# Patient Record
Sex: Female | Born: 1970 | Race: White | Hispanic: No | Smoking: Current every day smoker
Health system: Southern US, Community
[De-identification: ages and names within clinical notes are randomized; demographics above are authoritative.]

## PROBLEM LIST (undated history)

## (undated) DIAGNOSIS — I509 Heart failure, unspecified: Secondary | ICD-10-CM

## (undated) DIAGNOSIS — E785 Hyperlipidemia, unspecified: Secondary | ICD-10-CM

## (undated) DIAGNOSIS — I4729 Other ventricular tachycardia: Secondary | ICD-10-CM

## (undated) DIAGNOSIS — I2510 Atherosclerotic heart disease of native coronary artery without angina pectoris: Secondary | ICD-10-CM

## (undated) DIAGNOSIS — E039 Hypothyroidism, unspecified: Secondary | ICD-10-CM

## (undated) DIAGNOSIS — I255 Ischemic cardiomyopathy: Secondary | ICD-10-CM

## (undated) DIAGNOSIS — N184 Chronic kidney disease, stage 4 (severe): Secondary | ICD-10-CM

## (undated) DIAGNOSIS — E669 Obesity, unspecified: Secondary | ICD-10-CM

## (undated) DIAGNOSIS — Z720 Tobacco use: Secondary | ICD-10-CM

## (undated) DIAGNOSIS — I5042 Chronic combined systolic (congestive) and diastolic (congestive) heart failure: Secondary | ICD-10-CM

## (undated) DIAGNOSIS — K859 Acute pancreatitis without necrosis or infection, unspecified: Secondary | ICD-10-CM

## (undated) DIAGNOSIS — Z955 Presence of coronary angioplasty implant and graft: Secondary | ICD-10-CM

## (undated) DIAGNOSIS — M1990 Unspecified osteoarthritis, unspecified site: Secondary | ICD-10-CM

## (undated) DIAGNOSIS — I10 Essential (primary) hypertension: Secondary | ICD-10-CM

## (undated) DIAGNOSIS — I472 Ventricular tachycardia: Secondary | ICD-10-CM

## (undated) HISTORY — PX: ABDOMINAL HYSTERECTOMY: SHX81

## (undated) HISTORY — DX: Presence of coronary angioplasty implant and graft: Z95.5

## (undated) HISTORY — DX: Atherosclerotic heart disease of native coronary artery without angina pectoris: I25.10

## (undated) HISTORY — PX: DILATION AND CURETTAGE OF UTERUS: SHX78

---

## 2005-07-13 ENCOUNTER — Emergency Department: Payer: Self-pay | Admitting: Emergency Medicine

## 2005-07-13 ENCOUNTER — Other Ambulatory Visit: Payer: Self-pay

## 2006-06-02 ENCOUNTER — Encounter: Payer: Self-pay | Admitting: Anesthesiology

## 2006-07-18 ENCOUNTER — Emergency Department: Payer: Self-pay | Admitting: Emergency Medicine

## 2006-11-03 ENCOUNTER — Emergency Department: Payer: Self-pay | Admitting: Emergency Medicine

## 2006-11-07 ENCOUNTER — Emergency Department: Payer: Self-pay

## 2007-01-09 ENCOUNTER — Emergency Department: Payer: Self-pay | Admitting: Emergency Medicine

## 2007-06-20 ENCOUNTER — Emergency Department: Payer: Self-pay | Admitting: Emergency Medicine

## 2007-12-23 ENCOUNTER — Ambulatory Visit: Payer: Self-pay | Admitting: Obstetrics & Gynecology

## 2007-12-23 ENCOUNTER — Other Ambulatory Visit: Payer: Self-pay

## 2007-12-24 ENCOUNTER — Ambulatory Visit: Payer: Self-pay | Admitting: Obstetrics & Gynecology

## 2008-02-07 ENCOUNTER — Inpatient Hospital Stay: Payer: Self-pay | Admitting: Internal Medicine

## 2008-09-24 ENCOUNTER — Emergency Department: Payer: Self-pay | Admitting: Emergency Medicine

## 2009-05-03 ENCOUNTER — Ambulatory Visit: Payer: Self-pay | Admitting: Anesthesiology

## 2009-05-09 ENCOUNTER — Ambulatory Visit: Payer: Self-pay | Admitting: Anesthesiology

## 2009-08-14 ENCOUNTER — Encounter: Payer: Self-pay | Admitting: Anesthesiology

## 2009-08-21 ENCOUNTER — Ambulatory Visit: Payer: Self-pay | Admitting: Anesthesiology

## 2009-09-13 ENCOUNTER — Encounter: Payer: Self-pay | Admitting: Anesthesiology

## 2009-10-09 ENCOUNTER — Encounter: Payer: Self-pay | Admitting: Anesthesiology

## 2009-10-13 ENCOUNTER — Encounter: Payer: Self-pay | Admitting: Anesthesiology

## 2010-10-31 IMAGING — US ULTRASOUND RIGHT BREAST
1 series · 17 of 21 positions shown · non-contrast
Comparison: none

REASON FOR EXAM: density
COMMENTS:

PROCEDURE:     US  - US FHER RT BREAST  - May 09, 2009  [DATE]
RESULT:     The right breast is evaluated in the region of interest from the
10 o'clock to the 12 o'clock position in the far lateral portion of the
breast. No solid or cystic sonographic abnormalities are identified.

[Series 1: ultrasound right breast · 17 of 21 slices shown]
[im 1/21]
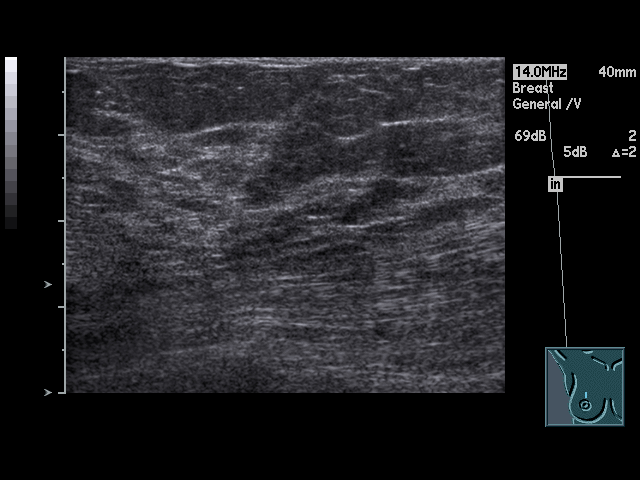
[im 2/21]
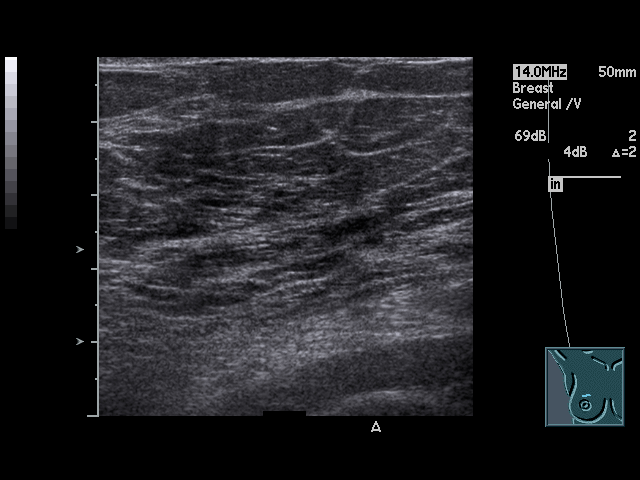
[im 4/21]
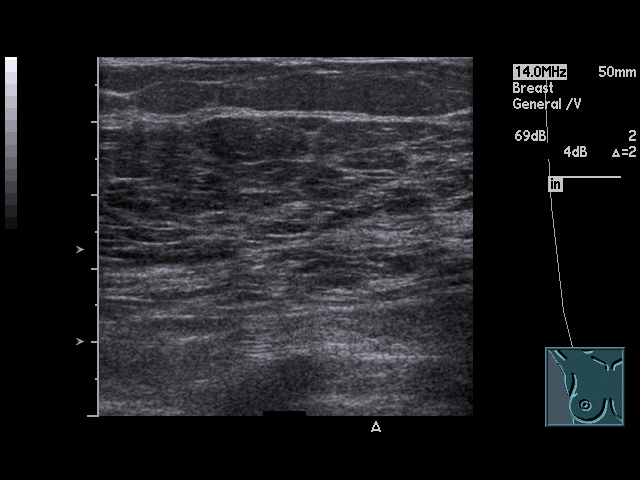
[im 5/21]
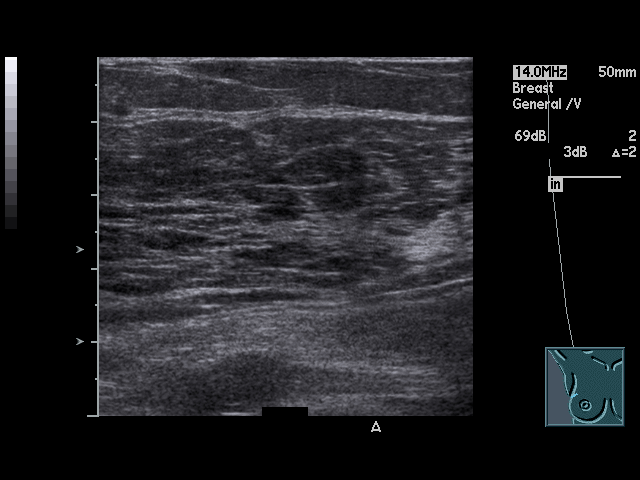
[im 6/21]
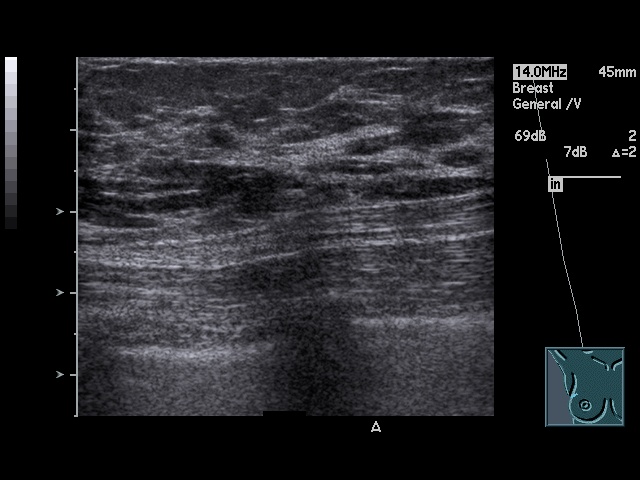
[im 7/21]
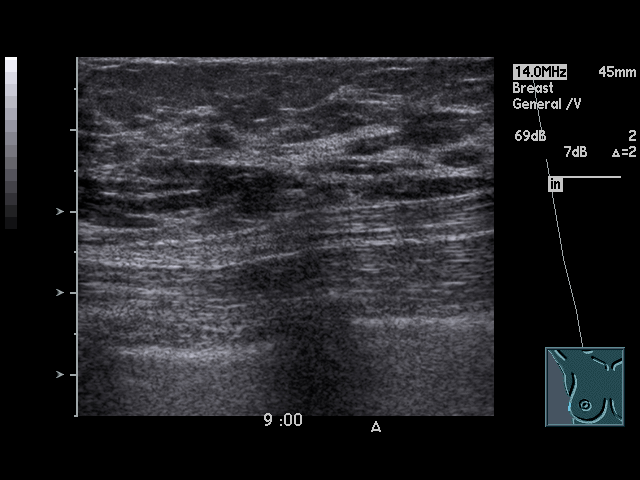
[im 9/21]
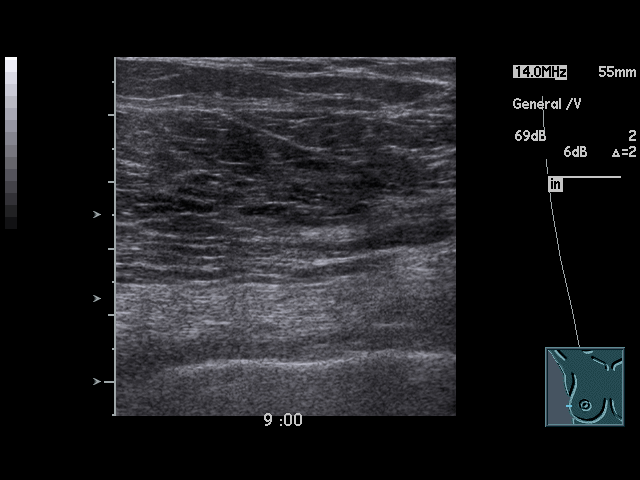
[im 10/21]
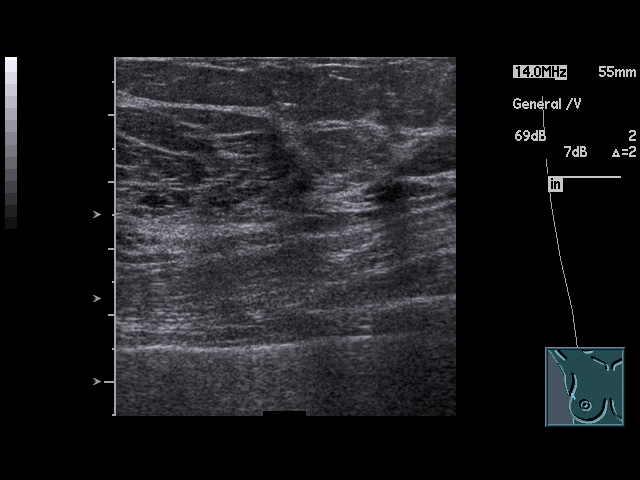
[im 11/21]
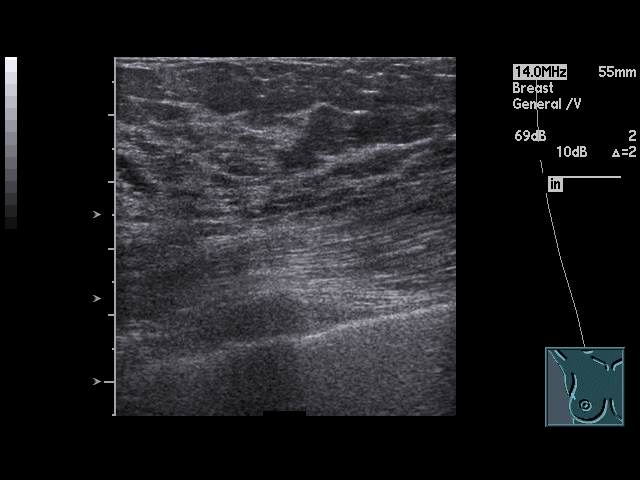
[im 12/21]
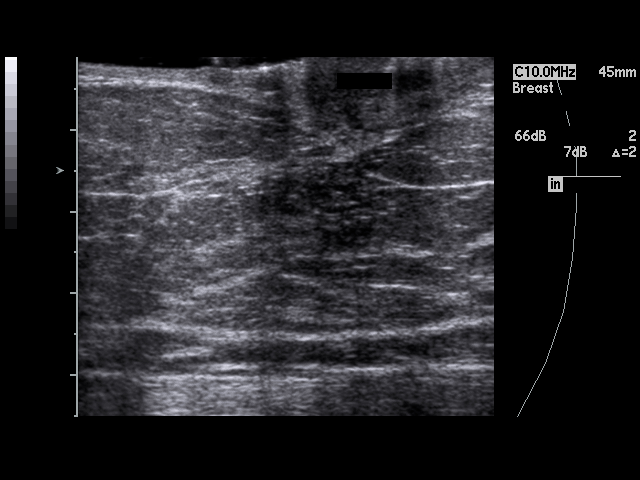
[im 13/21]
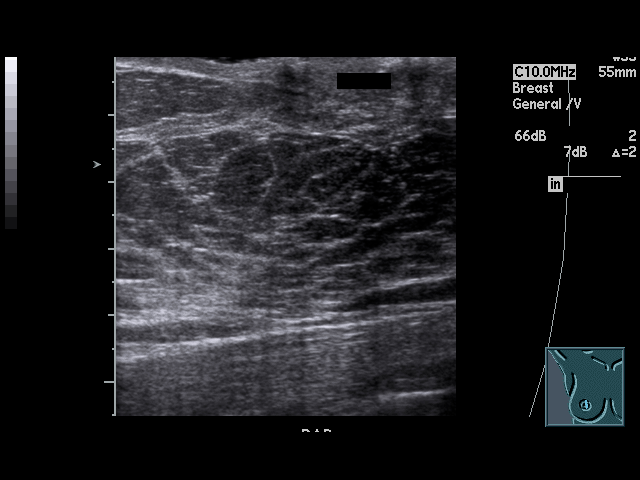
[im 15/21]
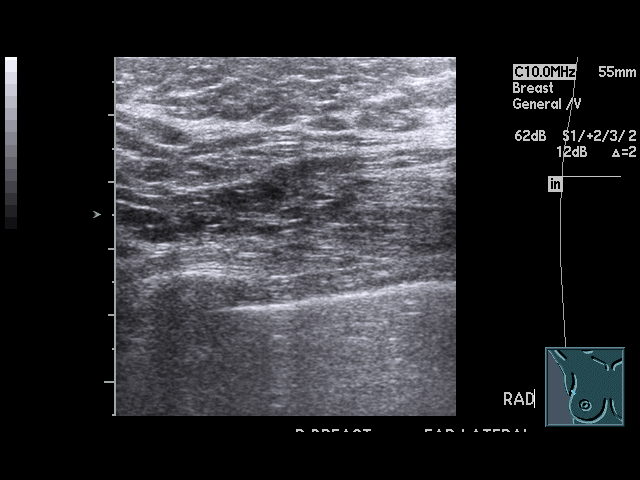
[im 16/21]
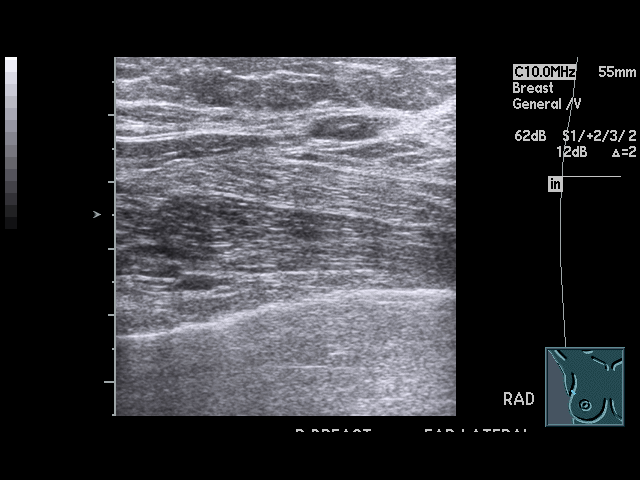
[im 17/21]
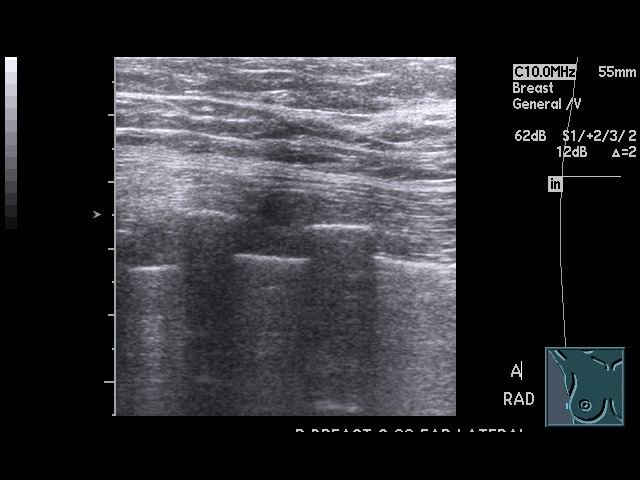
[im 18/21]
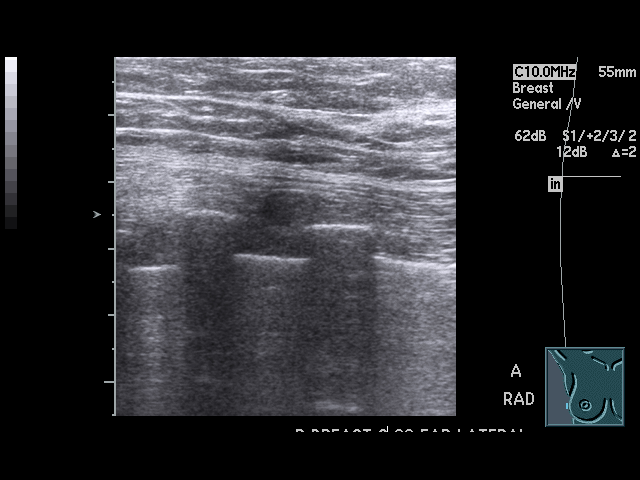
[im 20/21]
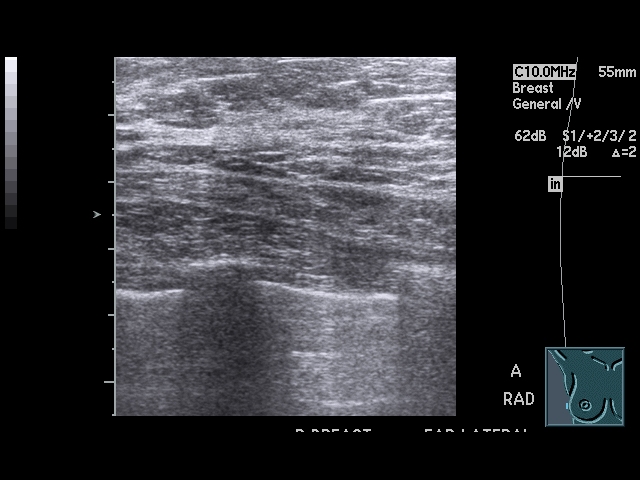
[im 21/21]
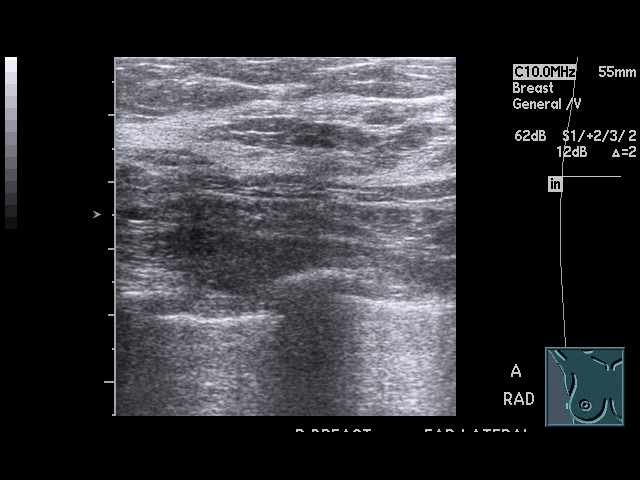

[17 of 21 positions shown; findings below may reference images not displayed]

IMPRESSION: 1.Unremarkable focused right breast ultrasound as described above. Please
refer to the additional radiographic view dictation for completed
discussion.

## 2011-03-27 ENCOUNTER — Emergency Department: Payer: Self-pay | Admitting: Emergency Medicine

## 2011-04-16 HISTORY — PX: CARDIAC CATHETERIZATION: SHX172

## 2011-05-08 ENCOUNTER — Emergency Department: Payer: Self-pay | Admitting: Emergency Medicine

## 2011-08-23 ENCOUNTER — Emergency Department: Payer: Self-pay | Admitting: Emergency Medicine

## 2011-08-23 LAB — T4, FREE: Free Thyroxine: 0.42 ng/dL — ABNORMAL LOW (ref 0.76–1.46)

## 2012-02-11 ENCOUNTER — Encounter (HOSPITAL_COMMUNITY)
Admission: EM | Disposition: A | Discharge status: Home / Self Care | Payer: Self-pay | Source: Home / Self Care | Attending: Cardiovascular Disease

## 2012-02-11 ENCOUNTER — Inpatient Hospital Stay (HOSPITAL_COMMUNITY)
Admission: EM | Admit: 2012-02-11 | Discharge: 2012-02-15 | DRG: 247 | Disposition: A | Discharge status: Home / Self Care | Payer: Medicaid Other | Attending: Cardiovascular Disease | Admitting: Cardiovascular Disease

## 2012-02-11 ENCOUNTER — Encounter (HOSPITAL_COMMUNITY): Payer: Self-pay | Admitting: Cardiology

## 2012-02-11 DIAGNOSIS — I2510 Atherosclerotic heart disease of native coronary artery without angina pectoris: Secondary | ICD-10-CM | POA: Diagnosis present

## 2012-02-11 DIAGNOSIS — I519 Heart disease, unspecified: Secondary | ICD-10-CM | POA: Diagnosis not present

## 2012-02-11 DIAGNOSIS — Z955 Presence of coronary angioplasty implant and graft: Secondary | ICD-10-CM

## 2012-02-11 DIAGNOSIS — I472 Ventricular tachycardia, unspecified: Secondary | ICD-10-CM | POA: Diagnosis not present

## 2012-02-11 DIAGNOSIS — F17200 Nicotine dependence, unspecified, uncomplicated: Secondary | ICD-10-CM | POA: Diagnosis present

## 2012-02-11 DIAGNOSIS — N1832 Chronic kidney disease, stage 3b: Secondary | ICD-10-CM | POA: Insufficient documentation

## 2012-02-11 DIAGNOSIS — E785 Hyperlipidemia, unspecified: Secondary | ICD-10-CM | POA: Diagnosis present

## 2012-02-11 DIAGNOSIS — E039 Hypothyroidism, unspecified: Secondary | ICD-10-CM | POA: Diagnosis present

## 2012-02-11 DIAGNOSIS — I10 Essential (primary) hypertension: Secondary | ICD-10-CM | POA: Diagnosis present

## 2012-02-11 DIAGNOSIS — I252 Old myocardial infarction: Secondary | ICD-10-CM

## 2012-02-11 DIAGNOSIS — M1990 Unspecified osteoarthritis, unspecified site: Secondary | ICD-10-CM | POA: Diagnosis present

## 2012-02-11 DIAGNOSIS — Y840 Cardiac catheterization as the cause of abnormal reaction of the patient, or of later complication, without mention of misadventure at the time of the procedure: Secondary | ICD-10-CM | POA: Diagnosis not present

## 2012-02-11 DIAGNOSIS — I255 Ischemic cardiomyopathy: Secondary | ICD-10-CM | POA: Diagnosis present

## 2012-02-11 DIAGNOSIS — N179 Acute kidney failure, unspecified: Secondary | ICD-10-CM | POA: Diagnosis present

## 2012-02-11 DIAGNOSIS — N184 Chronic kidney disease, stage 4 (severe): Secondary | ICD-10-CM | POA: Diagnosis present

## 2012-02-11 DIAGNOSIS — E669 Obesity, unspecified: Secondary | ICD-10-CM | POA: Diagnosis present

## 2012-02-11 DIAGNOSIS — I4729 Other ventricular tachycardia: Secondary | ICD-10-CM | POA: Diagnosis present

## 2012-02-11 DIAGNOSIS — I129 Hypertensive chronic kidney disease with stage 1 through stage 4 chronic kidney disease, or unspecified chronic kidney disease: Secondary | ICD-10-CM | POA: Diagnosis present

## 2012-02-11 DIAGNOSIS — I2589 Other forms of chronic ischemic heart disease: Secondary | ICD-10-CM | POA: Diagnosis present

## 2012-02-11 DIAGNOSIS — I2109 ST elevation (STEMI) myocardial infarction involving other coronary artery of anterior wall: Principal | ICD-10-CM

## 2012-02-11 DIAGNOSIS — Z720 Tobacco use: Secondary | ICD-10-CM

## 2012-02-11 HISTORY — DX: Other ventricular tachycardia: I47.29

## 2012-02-11 HISTORY — DX: Ischemic cardiomyopathy: I25.5

## 2012-02-11 HISTORY — PX: LEFT HEART CATHETERIZATION WITH CORONARY ANGIOGRAM: SHX5451

## 2012-02-11 HISTORY — DX: Obesity, unspecified: E66.9

## 2012-02-11 HISTORY — DX: Hypothyroidism, unspecified: E03.9

## 2012-02-11 HISTORY — DX: Ventricular tachycardia: I47.2

## 2012-02-11 HISTORY — DX: Essential (primary) hypertension: I10

## 2012-02-11 HISTORY — DX: Chronic kidney disease, stage 4 (severe): N18.4

## 2012-02-11 HISTORY — DX: Hyperlipidemia, unspecified: E78.5

## 2012-02-11 HISTORY — DX: Tobacco use: Z72.0

## 2012-02-11 HISTORY — DX: Atherosclerotic heart disease of native coronary artery without angina pectoris: I25.10

## 2012-02-11 SURGERY — LEFT HEART CATHETERIZATION WITH CORONARY ANGIOGRAM
Anesthesia: LOCAL

## 2012-02-11 MED ORDER — MIDAZOLAM HCL 2 MG/2ML IJ SOLN
INTRAMUSCULAR | Status: AC
  Filled 2012-02-11: qty 2

## 2012-02-11 MED ORDER — AMIODARONE HCL 150 MG/3ML IV SOLN
INTRAVENOUS | Status: AC
  Filled 2012-02-11: qty 3

## 2012-02-11 MED ORDER — HEPARIN (PORCINE) IN NACL 2-0.9 UNIT/ML-% IJ SOLN
INTRAMUSCULAR | Status: AC
  Filled 2012-02-11: qty 1500

## 2012-02-11 MED ORDER — BIVALIRUDIN 250 MG IV SOLR
INTRAVENOUS | Status: AC
  Filled 2012-02-11: qty 250

## 2012-02-11 MED ORDER — RAMIPRIL 2.5 MG PO CAPS
2.5000 mg | ORAL_CAPSULE | Freq: Every day | ORAL | Status: DC
  Filled 2012-02-11: qty 1

## 2012-02-11 MED ORDER — LIDOCAINE HCL (PF) 1 % IJ SOLN
INTRAMUSCULAR | Status: AC
  Filled 2012-02-11: qty 30

## 2012-02-11 MED ORDER — NITROGLYCERIN IN D5W 200-5 MCG/ML-% IV SOLN
INTRAVENOUS | Status: AC
  Filled 2012-02-11: qty 250

## 2012-02-11 MED ORDER — FENTANYL CITRATE 0.05 MG/ML IJ SOLN
INTRAMUSCULAR | Status: AC
  Filled 2012-02-11: qty 2

## 2012-02-11 MED ORDER — NITROGLYCERIN 0.2 MG/ML ON CALL CATH LAB
INTRAVENOUS | Status: AC
  Filled 2012-02-11: qty 1

## 2012-02-11 NOTE — H&P (Addendum)
Admit date: 02/11/2012 Primary Cardiologist   None Chief complaint/reason for admission:chest pain  HPI: This is a 41yo obese WF with a history of HTN and tobacco use who was in her USOH until 4PM when she awakened from a nap with chest pain.  The pain was in her back between her shoulder blades as well as both arms and chest.   This was associated with SOB, diaphoresis.  The pain increased in severity at 8PM and called EMS.  She was noted on EKG to have ST elevated in the lateral leads and significant ST depression in the anterior precordial leads.  Currently she complains pain as 5-6/10 in severity.    PMH:    HTN DJD Obesity Tobacco abuse Hypothyroidism   PSH:    Past Surgical History  Procedure Date  . Abdominal hysterectomy   . Dilation and curettage of uterus     ALLERGIES:   Review of patient's allergies indicates no known allergies.  Prior to Admit Meds:   (Not in a hospital admission) Family HX:   No family history on file. Social HX:    History   Social History  . Marital Status: Single    Spouse Name: N/A    Number of Children: N/A  . Years of Education: N/A   Occupational History  . Not on file.   Social History Main Topics  . Smoking status: Current Some Day Smoker -- 0.5 packs/day  . Smokeless tobacco: Not on file  . Alcohol Use:   . Drug Use: No  . Sexually Active:    Other Topics Concern  . Not on file   Social History Narrative  . No narrative on file     ROS:  All 11 ROS were addressed and are negative except what is stated in the HPI  PHYSICAL EXAM There were no vitals filed for this visit. General: Well developed, well nourished, in no acute distress Head: Eyes PERRLA, No xanthomas.   Normal cephalic and atramatic  Lungs:   Clear bilaterally to auscultation and percussion. Heart:   HRRR S1 S2 Pulses are 2+ & equal.            No carotid bruit. No JVD.  No abdominal bruits. No femoral bruits. Abdomen: Bowel sounds are positive, abdomen  soft and non-tender without masses  Extremities:   No clubbing, cyanosis or edema.  DP +1 Neuro: Alert and oriented X 3. Psych:  Good affect, responds appropriately       Radiology: Pending  EKG:  NSR with 57mm of ST elevation in I and aVL and reciprocal changes in the inferior leads.  ASSESSMENT:  1.  Acute STEMI with lateral ST elevation 2.  HTN 3.  Hypothyroidism 4.  Ongoing tobacco abuse 5.  Obesity 6.  Acute renal failure ? Secondary to diuretics  PLAN:   1.  Emergent cath per Dr. Burt Knack 2.  Hold  beta blocker secondary to hypotension in cath lab 3.  ASA 4.  Anticoagulation per Dr. Burt Knack 5.  Cycle cardiac enzymes until they peak 6.  Fasting lipid panel in am 7.  Smoking cessation counseling 8.  Add Lipitor 80mg  daily 9.  Hold diuretic post cath 10.  No ACE I secondary to acute renal failure  Sueanne Margarita, MD  02/11/2012  10:15 PM

## 2012-02-12 ENCOUNTER — Other Ambulatory Visit: Payer: Self-pay

## 2012-02-12 ENCOUNTER — Inpatient Hospital Stay (HOSPITAL_COMMUNITY): Payer: Medicaid Other

## 2012-02-12 DIAGNOSIS — I2109 ST elevation (STEMI) myocardial infarction involving other coronary artery of anterior wall: Secondary | ICD-10-CM

## 2012-02-12 DIAGNOSIS — I252 Old myocardial infarction: Secondary | ICD-10-CM

## 2012-02-12 DIAGNOSIS — I059 Rheumatic mitral valve disease, unspecified: Secondary | ICD-10-CM

## 2012-02-12 DIAGNOSIS — Z720 Tobacco use: Secondary | ICD-10-CM

## 2012-02-12 DIAGNOSIS — I2510 Atherosclerotic heart disease of native coronary artery without angina pectoris: Secondary | ICD-10-CM

## 2012-02-12 DIAGNOSIS — I10 Essential (primary) hypertension: Secondary | ICD-10-CM

## 2012-02-12 HISTORY — DX: Old myocardial infarction: I25.2

## 2012-02-12 HISTORY — DX: Essential (primary) hypertension: I10

## 2012-02-12 LAB — COMPREHENSIVE METABOLIC PANEL
Albumin: 3.4 g/dL — ABNORMAL LOW (ref 3.5–5.2)
Alkaline Phosphatase: 74 U/L (ref 39–117)
BUN: 32 mg/dL — ABNORMAL HIGH (ref 6–23)
Calcium: 8.7 mg/dL (ref 8.4–10.5)
Creatinine, Ser: 2.75 mg/dL — ABNORMAL HIGH (ref 0.50–1.10)
GFR calc non Af Amer: 20 mL/min — ABNORMAL LOW (ref >90)
Glucose, Bld: 119 mg/dL — ABNORMAL HIGH (ref 70–99)
Total Bilirubin: 0.2 mg/dL — ABNORMAL LOW (ref 0.3–1.2)
Total Protein: 6.5 g/dL (ref 6.0–8.3)

## 2012-02-12 LAB — HEMOGLOBIN A1C: Mean Plasma Glucose: 111 mg/dL (ref <117)

## 2012-02-12 LAB — POCT I-STAT 3, ART BLOOD GAS (G3+)
Acid-base deficit: 6 mmol/L — ABNORMAL HIGH (ref 0.0–2.0)
Bicarbonate: 16.9 mEq/L — ABNORMAL LOW (ref 20.0–24.0)
O2 Saturation: 94 %
pCO2 arterial: 25.8 mmHg — ABNORMAL LOW (ref 35.0–45.0)
pO2, Arterial: 66 mmHg — ABNORMAL LOW (ref 80.0–100.0)

## 2012-02-12 LAB — URINALYSIS, ROUTINE W REFLEX MICROSCOPIC
Glucose, UA: NEGATIVE mg/dL
Ketones, ur: NEGATIVE mg/dL
Nitrite: NEGATIVE
Protein, ur: NEGATIVE mg/dL

## 2012-02-12 LAB — BASIC METABOLIC PANEL
BUN: 31 mg/dL — ABNORMAL HIGH (ref 6–23)
CO2: 18 mEq/L — ABNORMAL LOW (ref 19–32)
Chloride: 97 mEq/L (ref 96–112)
Creatinine, Ser: 2.73 mg/dL — ABNORMAL HIGH (ref 0.50–1.10)

## 2012-02-12 LAB — CBC WITH DIFFERENTIAL/PLATELET
Basophils Relative: 0 % (ref 0–1)
HCT: 43.4 % (ref 36.0–46.0)
Hemoglobin: 14.5 g/dL (ref 12.0–15.0)
Lymphocytes Relative: 6 % — ABNORMAL LOW (ref 12–46)
Lymphs Abs: 0.9 10*3/uL (ref 0.7–4.0)
MCHC: 33.4 g/dL (ref 30.0–36.0)
Monocytes Absolute: 0.3 10*3/uL (ref 0.1–1.0)
Monocytes Relative: 2 % — ABNORMAL LOW (ref 3–12)
Neutro Abs: 15.4 10*3/uL — ABNORMAL HIGH (ref 1.7–7.7)
Neutrophils Relative %: 92 % — ABNORMAL HIGH (ref 43–77)
RBC: 4.74 MIL/uL (ref 3.87–5.11)

## 2012-02-12 LAB — POCT I-STAT, CHEM 8
BUN: 29 mg/dL — ABNORMAL HIGH (ref 6–23)
Chloride: 105 mEq/L (ref 96–112)
Creatinine, Ser: 2.6 mg/dL — ABNORMAL HIGH (ref 0.50–1.10)
Glucose, Bld: 117 mg/dL — ABNORMAL HIGH (ref 70–99)
Hemoglobin: 13.9 g/dL (ref 12.0–15.0)
Potassium: 3.3 mEq/L — ABNORMAL LOW (ref 3.5–5.1)
Sodium: 136 mEq/L (ref 135–145)

## 2012-02-12 LAB — LIPID PANEL
LDL Cholesterol: 92 mg/dL (ref 0–99)
Triglycerides: 128 mg/dL (ref <150)
VLDL: 26 mg/dL (ref 0–40)

## 2012-02-12 LAB — TSH: TSH: 0.474 u[IU]/mL (ref 0.350–4.500)

## 2012-02-12 LAB — CBC
HCT: 42.2 % (ref 36.0–46.0)
MCV: 90.9 fL (ref 78.0–100.0)
RDW: 13.8 % (ref 11.5–15.5)
WBC: 16.7 10*3/uL — ABNORMAL HIGH (ref 4.0–10.5)

## 2012-02-12 LAB — CK TOTAL AND CKMB (NOT AT ARMC)
CK, MB: >500 ng/mL (ref 0.3–4.0)
CK, MB: 361.9 ng/mL (ref 0.3–4.0)
CK, MB: 235.8 ng/mL (ref 0.3–4.0)
Total CK: 5973 U/L — ABNORMAL HIGH (ref 7–177)
Total CK: 4160 U/L — ABNORMAL HIGH (ref 7–177)

## 2012-02-12 LAB — PROTIME-INR
INR: 5.01 (ref 0.00–1.49)
Prothrombin Time: 43.2 seconds — ABNORMAL HIGH (ref 11.6–15.2)

## 2012-02-12 LAB — APTT: aPTT: 103 seconds — ABNORMAL HIGH (ref 24–37)

## 2012-02-12 LAB — URINE MICROSCOPIC-ADD ON

## 2012-02-12 LAB — TROPONIN I: Troponin I: >20 ng/mL (ref <0.30)

## 2012-02-12 MED ORDER — PANTOPRAZOLE SODIUM 40 MG PO TBEC
40.0000 mg | DELAYED_RELEASE_TABLET | Freq: Every day | ORAL | Status: DC
  Administered 2012-02-13 – 2012-02-15 (×3): 40 mg via ORAL
  Filled 2012-02-12 (×3): qty 1

## 2012-02-12 MED ORDER — LIDOCAINE HCL (CARDIAC) 20 MG/ML IV SOLN
INTRAVENOUS | Status: AC
  Filled 2012-02-12: qty 5

## 2012-02-12 MED ORDER — ONDANSETRON HCL 4 MG/2ML IJ SOLN: 4.0000 mg | Freq: Four times a day (QID) | INTRAMUSCULAR | Status: DC | PRN

## 2012-02-12 MED ORDER — SODIUM CHLORIDE 0.9 % IV SOLN
0.2500 mg/kg/h | INTRAVENOUS | Status: AC
  Filled 2012-02-12: qty 250

## 2012-02-12 MED ORDER — ATORVASTATIN CALCIUM 80 MG PO TABS
80.0000 mg | ORAL_TABLET | Freq: Every day | ORAL | Status: DC
  Administered 2012-02-12 – 2012-02-13 (×2): 80 mg via ORAL
  Filled 2012-02-12 (×4): qty 1

## 2012-02-12 MED ORDER — PRASUGREL HCL 10 MG PO TABS
ORAL_TABLET | ORAL | Status: AC
  Filled 2012-02-12: qty 6

## 2012-02-12 MED ORDER — NITROGLYCERIN 0.4 MG SL SUBL: 0.4000 mg | SUBLINGUAL_TABLET | SUBLINGUAL | Status: DC | PRN

## 2012-02-12 MED ORDER — ASPIRIN EC 81 MG PO TBEC: 81.0000 mg | DELAYED_RELEASE_TABLET | Freq: Every day | ORAL | Status: DC

## 2012-02-12 MED ORDER — SODIUM CHLORIDE 0.9 % IV SOLN
INTRAVENOUS | Status: AC
  Administered 2012-02-12: 02:00:00 via INTRAVENOUS

## 2012-02-12 MED ORDER — PRASUGREL HCL 10 MG PO TABS
10.0000 mg | ORAL_TABLET | Freq: Every day | ORAL | Status: DC
  Administered 2012-02-12 – 2012-02-15 (×4): 10 mg via ORAL
  Filled 2012-02-12 (×4): qty 1

## 2012-02-12 MED ORDER — MORPHINE SULFATE 2 MG/ML IJ SOLN
2.0000 mg | INTRAMUSCULAR | Status: DC | PRN
  Administered 2012-02-12 – 2012-02-13 (×5): 2 mg via INTRAVENOUS
  Filled 2012-02-12 (×4): qty 1

## 2012-02-12 MED ORDER — ACETAMINOPHEN 325 MG PO TABS: 650.0000 mg | ORAL_TABLET | ORAL | Status: DC | PRN

## 2012-02-12 MED ORDER — ALPRAZOLAM 0.5 MG PO TABS
1.0000 mg | ORAL_TABLET | Freq: Three times a day (TID) | ORAL | Status: DC
  Administered 2012-02-12 – 2012-02-15 (×10): 1 mg via ORAL
  Filled 2012-02-12 (×3): qty 1
  Filled 2012-02-12 (×6): qty 2
  Filled 2012-02-12: qty 1
  Filled 2012-02-12 (×2): qty 2

## 2012-02-12 MED ORDER — OXYCODONE-ACETAMINOPHEN 5-325 MG PO TABS
1.0000 | ORAL_TABLET | ORAL | Status: DC | PRN
  Administered 2012-02-13 – 2012-02-15 (×8): 2 via ORAL
  Administered 2012-02-15 (×2): 1 via ORAL
  Filled 2012-02-12 (×6): qty 2
  Filled 2012-02-12: qty 1
  Filled 2012-02-12 (×3): qty 2
  Filled 2012-02-12 (×2): qty 1

## 2012-02-12 MED ORDER — ASPIRIN 81 MG PO CHEW
81.0000 mg | CHEWABLE_TABLET | Freq: Every day | ORAL | Status: DC
  Administered 2012-02-12 – 2012-02-15 (×4): 81 mg via ORAL
  Filled 2012-02-12 (×4): qty 1

## 2012-02-12 MED ORDER — DOPAMINE-DEXTROSE 3.2-5 MG/ML-% IV SOLN
INTRAVENOUS | Status: AC
  Filled 2012-02-12: qty 250

## 2012-02-12 MED ORDER — LORAZEPAM 1 MG PO TABS
1.0000 mg | ORAL_TABLET | Freq: Once | ORAL | Status: AC
  Administered 2012-02-12: 1 mg via ORAL
  Filled 2012-02-12: qty 1

## 2012-02-12 MED ORDER — ACETAMINOPHEN 325 MG PO TABS
650.0000 mg | ORAL_TABLET | ORAL | Status: DC | PRN
  Administered 2012-02-12: 650 mg via ORAL
  Filled 2012-02-12: qty 2

## 2012-02-12 MED ORDER — CARVEDILOL 3.125 MG PO TABS
3.1250 mg | ORAL_TABLET | Freq: Two times a day (BID) | ORAL | Status: DC
  Administered 2012-02-12 – 2012-02-15 (×5): 3.125 mg via ORAL
  Filled 2012-02-12 (×9): qty 1

## 2012-02-12 MED ORDER — LEVOTHYROXINE SODIUM 100 MCG PO TABS
100.0000 ug | ORAL_TABLET | Freq: Every day | ORAL | Status: DC
  Administered 2012-02-12 – 2012-02-15 (×4): 100 ug via ORAL
  Filled 2012-02-12 (×5): qty 1

## 2012-02-12 MED ORDER — ONDANSETRON HCL 4 MG/2ML IJ SOLN
INTRAMUSCULAR | Status: AC
  Filled 2012-02-12: qty 2

## 2012-02-12 MED ORDER — MORPHINE SULFATE 2 MG/ML IJ SOLN
INTRAMUSCULAR | Status: AC
  Administered 2012-02-12: 2 mg via INTRAVENOUS
  Filled 2012-02-12: qty 1

## 2012-02-12 MED ORDER — NICOTINE 7 MG/24HR TD PT24
7.0000 mg | MEDICATED_PATCH | Freq: Every day | TRANSDERMAL | Status: DC
  Administered 2012-02-12 – 2012-02-15 (×4): 7 mg via TRANSDERMAL
  Filled 2012-02-12 (×4): qty 1

## 2012-02-12 MED FILL — Dextrose Inj 5%: INTRAVENOUS | Qty: 50 | Status: AC

## 2012-02-12 NOTE — Progress Notes (Addendum)
ANTICOAGULATION CONSULT NOTE - Initial Consult  Pharmacy Consult for angiomax Indication: ACS s/p cath  No Known Allergies  Patient Measurements:   Heparin Dosing Weight: 79 kg  Vital Signs:    Labs: No results found for this basename: HGB:2,HCT:3,PLT:3,APTT:3,LABPROT:3,INR:3,HEPARINUNFRC:3,CREATININE:3,CKTOTAL:3,CKMB:3,TROPONINI:3 in the last 72 hours  CrCl is unknown because no creatinine reading has been taken and the patient has no height on file.   Medical History: Past Medical History  Diagnosis Date  . Hypertension     Medications:  Scheduled:    . amiodarone      . bivalirudin      . bivalirudin      . DOPamine      . fentaNYL      . heparin      . lidocaine (cardiac) 100 mg/20ml      . lidocaine      . midazolam      . midazolam      . nitroGLYCERIN      . nitroGLYCERIN      . ondansetron      . prasugrel      . ramipril  2.5 mg Oral Daily   Infusions:    Assessment: 41 yo female s/p cath will be continued on angiomax for 2 hours.  Patient is not on heparin post cath.   Goal of Therapy:      Plan:  1) Continue angiomax 0.25 mg/kg/hr for 2 hours then stop.   Marializ Ferrebee, Tsz-Yin 02/12/2012,1:02 AM

## 2012-02-12 NOTE — CV Procedure (Signed)
Cardiac Catheterization Procedure Note  Name: Angelica Herrera MRN: [id number] DOB: 1970-07-17  Procedure: Left Heart Cath, Selective Coronary Angiography, LV angiography,  PTCA/Stent of the left main, PTCA of the LAD, Perclose of right femoral artery  Indication: Anterolateral ST elevation MI   Diagnostic Procedure Details: The right groin was prepped, draped, and anesthetized with 1% lidocaine. Using the modified Seldinger technique, a 6 French sheath was introduced into the right femoral artery. Standard Judkins catheters were used for selective coronary angiography and left ventriculography. Catheter exchanges were performed over a wire.  The diagnostic procedure was well-tolerated without immediate complications.  PROCEDURAL FINDINGS Hemodynamics: AO 125/95 LV 123/17  Coronary angiography: Coronary dominance: right  Left mainstem: Total occlusion at the ostium  Left anterior descending (LAD): Fills faintly from right-sided collaterals. Small severely diseased vessel.  Left circumflex (LCx): The left circumflex is visualized only after reopening the left main. The circumflex supplies a small territory with a moderate caliber intermediate branch and a small AV groove circumflex  Right coronary artery (RCA): Large, dominant vessel. There is mild 30-40% mid vessel stenosis. The acute marginal, PDA, and PLA branches are all patent. There are collateral supply to the LAD/diagonal territory.  Left ventriculography: Left ventricular systolic function shows akinesis of the anterolateral wall. The apex and inferior wall contract vigorously. There is severe mitral regurgitation present.  PCI Procedure Note:  Following the diagnostic procedure, the decision was made to proceed with PCI.  Weight-based bivalirudin was given for anticoagulation. Unfortunately the left mainstem was extremely difficult to visualize. I attempted multiple guide catheters, probably greater than 10 catheters in all. A  prolonged attempt was made and this created a major delay in door to device time. Aortograms were performed in multiple projections. I finally was able to get close to the left mainstem with a JL-4 5 French bright tip catheter. A BMW coronary guidewire was used to cross the lesion.  The lesion was predilated with a 1.5 mm balloon.  The patient became hemodynamically unstable after opening her left main. She developed ventricular tachycardia and we treated her with intravenous lidocaine and amiodarone. This caused marked hypotension and she finally converted to sinus rhythm. She did not require electrical cardioversion. Dopamine had to be started to support her blood pressure. When she stabilized hemodynamically, attention was turned to the mid LAD which remained occluded. The vessel was wired and multiple balloon dilatations were performed. The vessel is extremely small I was never able to restore TIMI-3 flow into the LAD. I felt the most appropriate thing to do was to stabilize the left mainstem. The left main was stented with a 3.0 x 15 mm drug-eluting stent deployed at 12 atmospheres. The stent was postdilated with a 3.0 mm noncompliant balloon to 16 atmospheres on 2 inflations. There was an excellent angiographic result with TIMI-3 flow into the circumflex, intermediate branch, and proximal left mainstem.  Following PCI, there was 0% residual stenosis and TIMI-3 flow. Final angiography confirmed an excellent result. Femoral hemostasis was achieved with a Perclose device.  The patient was hemodynamically stable at the completion of the procedure. There were no immediate procedural complications.  The patient was transferred to the post catheterization recovery area for further monitoring.  PCI Data: Vessel - left main/Segment - ostial Percent Stenosis (pre)  100 TIMI-flow 0 Stent 3.0 x 15 mm drug-eluting Percent Stenosis (post) 0 TIMI-flow (post) 3  Lesion 2: LAD/mid Percent stenosis 100 TIMI flow  0 Device: 1.5 mm balloon Percent stenosis post 100  TIMI flow 0  Final Conclusions:   1. Acute total occlusion of the left mainstem 2. Nonobstructive stenosis of a large, dominant right coronary artery 3. Successful very complex PCI of the native left main with challenging anatomy 4. Residual total occlusion of the LAD in the midsegment. This vessel is extremely small in caliber. 5. Moderate segmental left ventricular systolic dysfunction with an estimated LVEF of 35-40%  Recommendations: Will monitor the patient closely in the CCU. She was given 60 mg of effient at the completion of the procedure. Bivalirudin will be continued at ACS dose for 2 hours. Post MI medical therapy will be instituted. She will require tobacco cessation and aggressive risk reduction measures.  [name] 02/12/2012, 12:39 AM

## 2012-02-12 NOTE — Progress Notes (Signed)
Brief Nutrition Note:   RD pulled to chart for pt with unintentional weight loss and poor appetite PTA.   Pt states that she was off her thyroid medicine for a while and that caused weight changes. Pt states her appetite is variable, some days eats everything some days will not eat at all.  Pt states that in the last few months her weight has been trending up. 150-160 lbs UBW, now at 174 lbs.   Pt eating breakfast at time of RD visit, consumed >50% of meal.  Body mass index is 28.11 kg/(m^2). Pt is overweight per current BMI.  Diet: Heart healthy  Chart Reviewed, no nutrition interventions warranted at this time. Please consult as needed.   Orson Slick RD, LDN Pager [phone] After Hours pager [phone]

## 2012-02-12 NOTE — Progress Notes (Signed)
[facility] Progress Note Patient Name: Angelica Herrera DOB: April 13, 1971 MRN: [id number]  Date of Service  02/12/2012   HPI/Events of Note   SUP  needed  eICU Interventions  PPI ordered    Intervention Category Intermediate Interventions: Best-practice therapies (e.g. DVT, beta blocker, etc.)  Asencion Noble 02/12/2012, 4:18 PM

## 2012-02-12 NOTE — Care Management Note (Signed)
    Page 1 of 1   02/12/2012     9:59:01 AM   CARE MANAGEMENT NOTE 02/12/2012  Patient:  Angelica Herrera, Angelica Herrera   Account Number:  [id number]  Date Initiated:  02/12/2012  Documentation initiated by:  Elissa Hefty  Subjective/Objective Assessment:   adm w mi     Action/Plan:   lives w husband and children, has medicaid for meds, pcp dr Rogue Jury   Anticipated DC Date:     Anticipated DC Plan:        DC Planning Services  CM consult      Choice offered to / List presented to:             Status of service:   Medicare Important Message given?   (If response is "NO", the following Medicare IM given date fields will be blank) Date Medicare IM given:   Date Additional Medicare IM given:    Discharge Disposition:  HOME/SELF CARE  Per UR Regulation:  Reviewed for med. necessity/level of care/duration of stay  If discussed at [place] of Stay Meetings, dates discussed:    Comments:  10/30 10am debbie Joyel Chenette rn,bsn [id number] spoke w pt and husband, she has medicaid ins. left pt 30day free card for effient.

## 2012-02-12 NOTE — Interval H&P Note (Signed)
History and Physical Interval Note:  02/12/2012 12:38 AM  Angelica Herrera  has presented today for surgery, with the diagnosis of Stemi  The various methods of treatment have been discussed with the patient and family. After consideration of risks, benefits and other options for treatment, the patient has consented to  Procedure(s) (LRB) with comments: LEFT HEART CATHETERIZATION WITH CORONARY ANGIOGRAM (N/A) as a surgical intervention .  The patient's history has been reviewed, patient examined, no change in status, stable for surgery.  I have reviewed the patient's chart and labs.  Questions were answered to the patient's satisfaction.     [name]

## 2012-02-12 NOTE — Progress Notes (Signed)
*  PRELIMINARY RESULTS* Echocardiogram 2D Echocardiogram has been performed.  Angelica Herrera 02/12/2012, 12:38 PM

## 2012-02-12 NOTE — Progress Notes (Signed)
Smoking cessation education done and information packet given to patient.

## 2012-02-12 NOTE — Progress Notes (Signed)
Cardiology Progress Note Patient Name: Angelica Herrera Date of Encounter: 02/12/2012, 6:16 AM     Subjective  NSVT on tele. Patient asymptomatic and hemodynamically stable. Has some chest tenderness, but nothing like before she came in. No sob, cough, or dysuria.    Objective   Telemetry: sinus rhythm 80s, freq runs of 3-5 beats NSVT and 21beat run of NSVT  Medications: . amiodarone      . aspirin  81 mg Oral Daily  . atorvastatin  80 mg Oral q1800  . bivalirudin      . bivalirudin      . DOPamine      . fentaNYL      . heparin      . levothyroxine  100 mcg Oral QAC breakfast  . lidocaine (cardiac) 100 mg/9ml      . lidocaine      . LORazepam  1 mg Oral Once  . midazolam      . midazolam      . nitroGLYCERIN      . nitroGLYCERIN      . ondansetron      . prasugrel      . prasugrel  10 mg Oral Daily  . ramipril  2.5 mg Oral Daily  . DISCONTD: aspirin EC  81 mg Oral Daily   . sodium chloride 100 mL/hr at 02/12/12 0131  . bivalirudin (ANGIOMAX) infusion 5 mg/mL (Cath Lab,ACS,PCI indication) Stopped (02/12/12 0245)    Physical Exam: Temp:  [97.5 F (36.4 C)-97.8 F (36.6 C)] 97.8 F (36.6 C) (10/30 0600) Pulse Rate:  [77-90] 79  (10/30 0600) Resp:  [8-22] 19  (10/30 0600) BP: (89-110)/(65-89) 103/73 mmHg (10/30 0600) SpO2:  [90 %-100 %] 99 % (10/30 0600) Weight:  [174 lb 2.6 oz (79 kg)] 174 lb 2.6 oz (79 kg) (10/30 0100)  General: Middle aged white female, in no acute distress. Head: Normocephalic, atraumatic, sclera non-icteric, nares are without discharge.  Neck: Supple. Negative for carotid bruits or JVD Lungs: Clear bilaterally to auscultation without wheezes, rales, or rhonchi. Breathing is unlabored. Heart: RRR S1 S2 without murmurs, rubs, or gallops.  Abdomen: Soft, non-tender, non-distended with normoactive bowel sounds. No rebound/guarding. No obvious abdominal masses. Msk:  Strength and tone appear normal for age. Extremities: Right groin  without hematoma or bruit. No edema. No clubbing or cyanosis. Distal pedal pulses are intact and equal bilaterally. Neuro: Alert and oriented X 3. Moves all extremities spontaneously. Psych:  Responds to questions appropriately with a normal affect.   Intake/Output Summary (Last 24 hours) at 02/12/12 0616 Last data filed at 02/12/12 0300  Gross per 24 hour  Intake    200 ml  Output    550 ml  Net   -350 ml    Labs:  Basename 02/12/12 0126  NA 130*  K 3.7  CL 99  CO2 17*  GLUCOSE 119*  BUN 32*  CREATININE 2.75*  CALCIUM 8.7  MG 2.2   Basename 02/12/12 0126  AST 434*  ALT 62*  ALKPHOS 74  BILITOT 0.2*  PROT 6.5  ALBUMIN 3.4*   Basename 02/12/12 0126  WBC 16.7*  NEUTROABS 15.4*  HGB 14.5  HCT 43.4  MCV 91.6  PLT 263     02/12/2012 03:20  Prothrombin Time 25.6 (H)  INR 2.47 (H)   Basename 02/12/12 0115  TROPONINI >20.00*   Radiology/Studies:   02/12/12 - Cardiac Cath Hemodynamics:  AO 125/95  LV 123/17  Coronary angiography:  Coronary dominance: right  Left mainstem: Total occlusion at the ostium  Left anterior descending (LAD): Fills faintly from right-sided collaterals. Small severely diseased vessel.  Left circumflex (LCx): The left circumflex is visualized only after reopening the left main. The circumflex supplies a small territory with a moderate caliber intermediate branch and a small AV groove circumflex  Right coronary artery (RCA): Large, dominant vessel. There is mild 30-40% mid vessel stenosis. The acute marginal, PDA, and PLA branches are all patent. There are collateral supply to the LAD/diagonal territory.  Left ventriculography: Left ventricular systolic function shows akinesis of the anterolateral wall. The apex and inferior wall contract vigorously. There is severe mitral regurgitation present.  PCI Data:  Vessel - left main/Segment - ostial  Percent Stenosis (pre) 100  TIMI-flow 0  Stent 3.0 x 15 mm drug-eluting  Percent Stenosis  (post) 0  TIMI-flow (post) 3  Lesion 2: LAD/mid  Percent stenosis 100  TIMI flow 0  Device: 1.5 mm balloon  Percent stenosis post 100  TIMI flow 0  Final Conclusions:  1. Acute total occlusion of the left mainstem  2. Nonobstructive stenosis of a large, dominant right coronary artery  3. Successful very complex PCI of the native left main with challenging anatomy  4. Residual total occlusion of the LAD in the midsegment. This vessel is extremely small in caliber.  5. Moderate segmental left ventricular systolic dysfunction with an estimated LVEF of 35-40%  Recommendations: Will monitor the patient closely in the CCU. She was given 60 mg of effient at the completion of the procedure. Bivalirudin will be continued at ACS dose for 2 hours. Post MI medical therapy will be instituted. She will require tobacco cessation and aggressive risk reduction measures.    Assessment and Plan    1. Acute Anterior STEMI: s/p DES to LM with residual total occlusion of the mid LAD. LVEF 35-40%. During cath she developed VT treated with lidocaine and amiodarone with subsequent hypotension treated with dopamine. She has done well overnight and has not required any further antiarrythmics or vasopressors. EKG pending. Troponin >20. Cont to trend along with CK/MB. Cont ASA, Effient, ACEI, and statin. Consider addition of BB if BP tolerates. Lipid panel and A1c pending.  2. NSVT: Frequent episodes of 3-5 beats NSVT along with a prolonged episode of 21 beats this morning. Has remained hemodynamically stable and asymptomatic. BP soft 90-100s. MD advise on initiating low dose BB (was on 50mg  lopressor bid at home).  3. Leukocytosis: In the setting of #1. Afebrile without obvious source of infection. Will check UA and CXR.  4. Transaminitis: likely reflective of myocardial enzyme release rather than hepatic source  5. Acute Renal Insufficiency: Crt 2.75. Baseline unknown. Cont IVF and check BMET in am.  6.  Hypertension: BP soft. Cont to monitor.  7. Tobacco Abuse: Counseled on cessation  8. Hypothyroidism: TSH pending. Cont synthroid  Signed, HOPE, JESSICA PA-C  Patient seen, examined. Available data reviewed. Agree with findings, assessment, and plan as outlined by [facility], PA-C. Pt independently examined. Exam pertinent for an S4 gallop and clear lung fields. I'm going to wait on starting an ACE-I because of significant renal insufficiency and high contrast load at cath. Start coreg 3.125 mg BID. Will check a 2D echo to evaluate for MR (seen at cath but no murmur on exam). Continue ASA, effient, and high-dose lipitor. Start nicotine patch. Keep in CCU today on bedrest today with up to chair. Will stop IV fluids and allow her  to equilibrate.  [name], M.D. 02/12/2012 7:57 AM

## 2012-02-13 ENCOUNTER — Encounter (HOSPITAL_COMMUNITY): Payer: Self-pay | Admitting: *Deleted

## 2012-02-13 LAB — COMPREHENSIVE METABOLIC PANEL
Alkaline Phosphatase: 57 U/L (ref 39–117)
BUN: 30 mg/dL — ABNORMAL HIGH (ref 6–23)
Creatinine, Ser: 2.48 mg/dL — ABNORMAL HIGH (ref 0.50–1.10)
GFR calc Af Amer: 26 mL/min — ABNORMAL LOW (ref >90)
Glucose, Bld: 95 mg/dL (ref 70–99)
Potassium: 3.8 mEq/L (ref 3.5–5.1)
Total Bilirubin: 0.2 mg/dL — ABNORMAL LOW (ref 0.3–1.2)
Total Protein: 5.5 g/dL — ABNORMAL LOW (ref 6.0–8.3)

## 2012-02-13 LAB — URINE CULTURE
Colony Count: NO GROWTH
Culture: NO GROWTH

## 2012-02-13 LAB — LIPID PANEL
Cholesterol: 129 mg/dL (ref 0–200)
Triglycerides: 117 mg/dL (ref <150)

## 2012-02-13 MED ORDER — CARISOPRODOL 350 MG PO TABS: 350.0000 mg | ORAL_TABLET | Freq: Four times a day (QID) | ORAL | Status: DC | PRN

## 2012-02-13 NOTE — Progress Notes (Addendum)
    Subjective:  Residual constant left lateral chest pain/soreness. No dyspnea or other complaints.  Objective:  Vital Signs in the last 24 hours: Temp:  [97.6 F (36.4 C)-98.7 F (37.1 C)] 98.7 F (37.1 C) (10/31 0758) Pulse Rate:  [81-92] 87  (10/31 0800) Resp:  [9-24] 21  (10/31 0800) BP: (60-101)/(38-76) 98/68 mmHg (10/31 0800) SpO2:  [91 %-99 %] 96 % (10/31 0800)  Intake/Output from previous day: 10/30 0701 - 10/31 0700 In: 828.3 [P.O.:720; I.V.:108.3] Out: 800 [Urine:800]  Physical Exam: Pt is alert and oriented, NAD HEENT: normal Neck: JVP - normal Lungs: CTA bilaterally CV: RRR without murmur or gallop Abd: soft, NT, Positive BS, no hepatomegaly Ext: no C/C/E, distal pulses intact and equal Skin: warm/dry no rash  Lab Results:  Basename 02/12/12 0650 02/12/12 0126  WBC 16.7* 16.7*  HGB 14.4 14.5  PLT 214 263    Basename 02/13/12 0525 02/12/12 0650  NA 134* 127*  K 3.8 4.4  CL 103 97  CO2 21 18*  GLUCOSE 95 94  BUN 30* 31*  CREATININE 2.48* 2.73*    Basename 02/12/12 1828 02/12/12 1325  TROPONINI >20.00* >20.00*    Cardiac Studies: 2D Echo: Left ventricle: The cavity size was normal. Wall thickness was increased in a pattern of mild LVH. Systolic function was mildly to moderately reduced. The estimated ejection fraction was in the range of 40% to 45%. Regional wall motion abnormalities: Hypokinesis of the entireanterior and lateral myocardium. Doppler parameters are consistent with abnormal left ventricular relaxation (grade 1 diastolic dysfunction).  ------------------------------------------------------------ Aortic valve: Structurally normal valve. Cusp separation was normal. Doppler: Transvalvular velocity was within the normal range. There was no stenosis. No regurgitation.  ------------------------------------------------------------ Aorta: Aortic root: The aortic root was normal in size. Ascending aorta: The ascending aorta was  normal in size.  ------------------------------------------------------------ Mitral valve: Structurally normal valve. Leaflet separation was normal. Doppler: Transvalvular velocity was within the normal range. There was no evidence for stenosis. Mild regurgitation.  ------------------------------------------------------------ Left atrium: The atrium was normal in size.  ------------------------------------------------------------ Right ventricle: The cavity size was normal. Wall thickness was normal. Systolic function was normal.  ------------------------------------------------------------ Pulmonic valve: Poorly visualized. Doppler: No significant regurgitation.  ------------------------------------------------------------ Tricuspid valve: Structurally normal valve. Leaflet separation was normal. Doppler: Transvalvular velocity was within the normal range. Trivial regurgitation.  ------------------------------------------------------------ Right atrium: The atrium was normal in size.  ------------------------------------------------------------ Pericardium: There was no pericardial effusion.  ------------------------------------------------------------ Systemic veins: Inferior vena cava: The vessel was dilated; the respirophasic diameter changes were blunted (< 50%); findings are consistent with elevated central venous pressure.  Tele: sinus rhythm with PVC's.  Assessment/Plan:  1. Acute anterolateral MI - s/p left main PCI with DES. DAPT with ASA and effient at least 12 months. Post-MI med Rx limited by hypotension. Continue 3.125 coreg, BP too low for ACE. Fortunately she has only moderate LV dysfunction.  2. Chronic kidney disease - fortunately creatinine has remained stable despite contrast load. Will need ACE-I long-term and probably will need renal duplex as an outpatient.  3. Tobacco - continue nicotine replacement.  4. Hyperlipidemia - continue high-dose  statin.  5. Dispo - ambulate with cardiac rehab today. Tx stepdown today.  [name], M.D. 02/13/2012, 9:17 AM

## 2012-02-13 NOTE — Progress Notes (Signed)
CARDIAC REHAB PHASE I   PRE:  Rate/Rhythm: 91SR  BP:  Supine: 101/68  Sitting:   Standing:    SaO2: 96%RA  MODE:  Ambulation: 390 ft   POST:  Rate/Rhythem: 109ST  BP:  Supine:   Sitting: 98/67  Standing:    SaO2: 96%RA 0950-1052 Pt walked 390 ft on Ra with handheld asst. C/o slight lightheadedness but no CP. Tolerated well. Began ed with pt and family. Gave fake cigarette and smoking cessation handouts. Reviewed MI restrictions, NTG use, efficient use, and diet. Pt anxious and needed some reenforcement. Will continue ed tomorrow. Family in room and supportive.  Jeani Sow

## 2012-02-13 NOTE — Plan of Care (Signed)
Problem: Phase II Progression Outcomes Goal: Vascular site scale level 0 - I Vascular Site Scale Level 0: No bruising/bleeding/hematoma Level I (Mild): Bruising/Ecchymosis, minimal bleeding/ooozing, palpable hematoma < 3 cm Level II (Moderate): Bleeding not affecting hemodynamic parameters, pseudoaneurysm, palpable hematoma > 3 cm  Outcome: Completed/Met Date Met:  02/13/12 0

## 2012-02-14 LAB — BASIC METABOLIC PANEL
CO2: 24 mEq/L (ref 19–32)
Calcium: 8.3 mg/dL — ABNORMAL LOW (ref 8.4–10.5)
GFR calc non Af Amer: 25 mL/min — ABNORMAL LOW (ref >90)
Potassium: 3.5 mEq/L (ref 3.5–5.1)
Sodium: 135 mEq/L (ref 135–145)

## 2012-02-14 LAB — TROPONIN I: Troponin I: >20 ng/mL (ref <0.30)

## 2012-02-14 NOTE — Progress Notes (Signed)
CARDIAC REHAB PHASE I   PRE:  Rate/Rhythm: 80 SR    BP: sitting 103/69    SaO2:   MODE:  Ambulation: 740 ft   POST:  Rate/Rhythm: 100 ST    BP: sitting 107/73     SaO2:   Tolerated well. Constant left side pain did not change. Sts she feels better today. Finished ed. Focused on stress management. Sts she has begun seeing a counselor and will f/u after d/c. Emphasized role of ex and CRPII. Pt agreeable and requests her name be sent to [facility]. [id number]  Darrick Meigs CES, ACSM

## 2012-02-14 NOTE — Progress Notes (Signed)
    Subjective:  Constant pain around lateral side of left chest, unchanged. Worse with lying on left side, no change with walking. No dyspnea or other complaints. She remains emotional and tearful especially when around family members.  Objective:  Vital Signs in the last 24 hours: Temp:  [97.2 F (36.2 C)-98.1 F (36.7 C)] 97.2 F (36.2 C) (11/01 0750) Pulse Rate:  [79-98] 84  (11/01 0750) Resp:  [18-20] 20  (11/01 0411) BP: (97-109)/(67-79) 104/75 mmHg (11/01 0750) SpO2:  [96 %-99 %] 99 % (11/01 0750)  Intake/Output from previous day: 10/31 0701 - 11/01 0700 In: 880 [P.O.:880] Out: -   Physical Exam: Pt is alert and oriented, tearful, in NAD HEENT: normal Neck: JVP - normal, carotids 2+= without bruits Lungs: CTA bilaterally CV: RRR without murmur or gallop Abd: soft, NT, Positive BS, no hepatomegaly Ext: no C/C/E, distal pulses intact and equal Skin: warm/dry no rash   Lab Results:  Basename 02/12/12 0650 02/12/12 0126  WBC 16.7* 16.7*  HGB 14.4 14.5  PLT 214 263    Basename 02/14/12 0505 02/13/12 0525  NA 135 134*  K 3.5 3.8  CL 101 103  CO2 24 21  GLUCOSE 122* 95  BUN 27* 30*  CREATININE 2.29* 2.48*    Basename 02/14/12 0505 02/12/12 1828  TROPONINI >20.00* >20.00*   Tele: sinus rhythm  Assessment/Plan:  1. Acute anterior STEMI - s/p left main PCI. Overall stable on current med Rx. I would continue her current meds. BP is soft so would avoid ACE especially in setting of creatinine 2.0-2.5 mg/dL. This can be added as outpatient. She should be ready for discharge tomorrow, but would benefit from another day of rehab, education. Follow-up [employer]. She should have transition of care follow-up.  2. HTN - BP now low after her MI. Continue coreg 3.125 mg BID.  3. Tobacco - cessation counseling done.   [name], M.D. 02/14/2012, 8:36 AM

## 2012-02-15 ENCOUNTER — Encounter (HOSPITAL_COMMUNITY): Payer: Self-pay | Admitting: Nurse Practitioner

## 2012-02-15 DIAGNOSIS — I2109 ST elevation (STEMI) myocardial infarction involving other coronary artery of anterior wall: Secondary | ICD-10-CM

## 2012-02-15 DIAGNOSIS — E669 Obesity, unspecified: Secondary | ICD-10-CM | POA: Diagnosis present

## 2012-02-15 DIAGNOSIS — E785 Hyperlipidemia, unspecified: Secondary | ICD-10-CM | POA: Diagnosis present

## 2012-02-15 DIAGNOSIS — I255 Ischemic cardiomyopathy: Secondary | ICD-10-CM | POA: Diagnosis present

## 2012-02-15 DIAGNOSIS — I4729 Other ventricular tachycardia: Secondary | ICD-10-CM | POA: Diagnosis present

## 2012-02-15 DIAGNOSIS — I472 Ventricular tachycardia: Secondary | ICD-10-CM | POA: Diagnosis present

## 2012-02-15 DIAGNOSIS — I10 Essential (primary) hypertension: Secondary | ICD-10-CM | POA: Diagnosis present

## 2012-02-15 DIAGNOSIS — N184 Chronic kidney disease, stage 4 (severe): Secondary | ICD-10-CM | POA: Diagnosis present

## 2012-02-15 DIAGNOSIS — I2510 Atherosclerotic heart disease of native coronary artery without angina pectoris: Secondary | ICD-10-CM | POA: Diagnosis present

## 2012-02-15 DIAGNOSIS — E039 Hypothyroidism, unspecified: Secondary | ICD-10-CM | POA: Diagnosis present

## 2012-02-15 MED ORDER — PANTOPRAZOLE SODIUM 40 MG PO TBEC: 40.0000 mg | DELAYED_RELEASE_TABLET | Freq: Every day | ORAL | Status: DC

## 2012-02-15 MED ORDER — PRASUGREL HCL 10 MG PO TABS: 10.0000 mg | ORAL_TABLET | Freq: Every day | ORAL | Status: DC

## 2012-02-15 MED ORDER — ATORVASTATIN CALCIUM 80 MG PO TABS: 80.0000 mg | ORAL_TABLET | Freq: Every day | ORAL | Status: DC

## 2012-02-15 MED ORDER — NITROGLYCERIN 0.4 MG SL SUBL: 0.4000 mg | SUBLINGUAL_TABLET | SUBLINGUAL | Status: DC | PRN

## 2012-02-15 MED ORDER — ASPIRIN 81 MG PO TABS: 81.0000 mg | ORAL_TABLET | Freq: Every day | ORAL | Status: AC

## 2012-02-15 MED ORDER — CARVEDILOL 6.25 MG PO TABS: 6.2500 mg | ORAL_TABLET | Freq: Two times a day (BID) | ORAL | Status: DC

## 2012-02-15 MED ORDER — NICOTINE 7 MG/24HR TD PT24: 1.0000 | MEDICATED_PATCH | Freq: Every day | TRANSDERMAL | Status: DC

## 2012-02-15 NOTE — Progress Notes (Signed)
Reviewed discharge instructions with patient. IV's out and patient is awaiting the arrival of family. Will continue to monitor.  Curley Fayette, Mervin Kung RN

## 2012-02-15 NOTE — Discharge Summary (Signed)
Patient ID: Angelica Herrera,  MRN: [id number], DOB/AGE: Feb 09, 1971 41 y.o.  Admit date: 02/11/2012 Discharge date: 02/15/2012  Primary Cardiologist: M. Fletcher Anon, MD  Discharge Diagnoses Principal Problem:  *Acute MI anterior wall first episode care  **s/p PCI/DES of the Left Main this admission with PTCA of the LAD. Active Problems:  CAD (coronary artery disease)  CKD (chronic kidney disease), stage IV  **Creatinine this admission 2.29-2.75.  Ischemic cardiomyopathy  **EF 40-45% by echo this admission.  Tobacco abuse  HTN (hypertension)  Hyperlipidemia  Obesity  NSVT (nonsustained ventricular tachycardia)  **Post-reperfusion.  Hypothyroidism  Allergies No Known Allergies  Procedures  Cardiac Catheterization and Percutaneous Coronary Intervention 02/12/2012  Hemodynamics: AO 125/95 LV 123/17  Coronary angiography: Coronary dominance: right  Left mainstem: Total occlusion at the ostium   **The Left Main was successfully stented using a 3.0 x 15 mm Drug Eluting Stent**  Left anterior descending (LAD): Fills faintly from right-sided collaterals. Small severely diseased vessel.   **The LAD was treated with PTCA (1.5 mm balloon)**  Left circumflex (LCx): The left circumflex is visualized only after reopening the left main. The circumflex supplies a small territory with a moderate caliber intermediate branch and a small AV groove circumflex Right coronary artery (RCA): Large, dominant vessel. There is mild 30-40% mid vessel stenosis. The acute marginal, PDA, and PLA branches are all patent. There are collateral supply to the LAD/diagonal territory.  Left ventriculography: Left ventricular systolic function shows akinesis of the anterolateral wall. The apex and inferior wall contract vigorously. There is severe mitral regurgitation present. _____________  2D Echocardiogram 02/12/2012  Study Conclusions  - Left ventricle: The cavity size was normal. Wall thickness   was  increased in a pattern of mild LVH. Systolic function   was mildly to moderately reduced. The estimated ejection   fraction was in the range of 40% to 45%. Hypokinesis of   the entireanterior and lateral myocardium. Doppler   parameters are consistent with abnormal left ventricular   relaxation (grade 1 diastolic dysfunction). - Mitral valve: Mild regurgitation. - Pulmonary arteries: PA peak pressure: 85mm Hg (S). _____________  History of Present Illness  41 y/o female without prior cardiac history who was in her USOH until the afternoon of admission when she awakened from a nap with severe midscapular back pain with radiation to her arms and chest, associated with dyspnea and diaphoresis.  Pain persisted for at least 4 hours before she called EMS.  Initial ECG showed lateral ST elevation with anterior depression.  Code STEMI was called and patient was taken to [facility] for further evaluation.  Hospital Course  Patient underwent emergent diagnostic catheterization revealing a total occlusion of the Left Main.  Following PTCA of the left main, she became unstable with ventricular tachycardia requiring IV lidocaine and amiodarone, with resultant hypotension and subsequent conversion to sinus rhythm.  Hypotension persisted and dopamine was initiated.  Attention was turned to the Left Main, which was successfully stented using a drug-eluting stent.  Following stenting, flow was restored throughout the left circumflex, ramus intermedius, and proximal left mainstem.  The LAD was a small vessel and filled faintly via right to left collaterals.  Left ventriculography showed anterolateral akinesis with suggestion of severe mitral regurgitation.  Post PCI, pt was monitored in the coronary intensive care unit where she eventually peaked her troponin @ >20.00.  She was noted to have frequent runs of asymptomatic and hemodynamically stable NSVT and was placed on beta blocker therapy.  Due to a creatinine  of  2.75, she was not placed on ACEI/ARB therapy.  Follow-up 2D echo was performed and showed and EF of 40-45% without evidence for mitral regurgitation.  Post-PCI, pt continued to complain of constant left sided chest pain, which was somewhat positional in nature.  She has been maintained on asa, effient, bb, and statin therapy and has been counseled on the importance of smoking cessation.  She has been evaluated by cardiac rehab and has been ambulating without limitations.  She will be discharged home today in good condition.  Discharge Vitals Blood pressure 119/90, pulse 97, temperature 97.8 F (36.6 C), temperature source Oral, resp. rate 18, height 5\' 6"  (1.676 m), weight 174 lb 2.6 oz (79 kg), SpO2 98.00%.  Filed Weights   02/12/12 0100 02/15/12 0609  Weight: 174 lb 2.6 oz (79 kg) 174 lb 2.6 oz (79 kg)   Labs  CBC Lab Results  Component Value Date   WBC 16.7* 02/12/2012   HGB 14.4 02/12/2012   HCT 42.2 02/12/2012   MCV 90.9 02/12/2012   PLT 214 [id number]   Basic Metabolic Panel  Basename [id number] 02/13/12 0525  NA 135 134*  K 3.5 3.8  CL 101 103  CO2 24 21  GLUCOSE 122* 95  BUN 27* 30*  CREATININE 2.29* 2.48*  CALCIUM 8.3* 8.3*  MG -- --  PHOS -- --   Liver Function Tests  [facility] 02/13/12 0525  AST 166*  ALT 46*  ALKPHOS 57  BILITOT 0.2*  PROT 5.5*  ALBUMIN 2.8*   Cardiac Enzymes  Basename 02/14/12 0505 02/12/12 1828 02/12/12 1325  CKTOTAL -- 3132* 4160*  CKMB -- 235.8* 361.9*  CKMBINDEX -- -- --  TROPONINI >20.00* >20.00* >20.00*   Hemoglobin A1C Lab Results  Component Value Date   HGBA1C 5.5 02/12/2012   Fasting Lipid Panel  Basename 02/13/12 0525  CHOL 129  HDL 33*  LDLCALC 73  TRIG 117  CHOLHDL 3.9  LDLDIRECT --   Disposition  Pt is being discharged home today in good condition.  Follow-up Plans & Appointments  Follow-up Information    Follow up with Kathlyn Sacramento, MD. (we will arrange for f/u in 7 days)    Contact  information:   [address] [place] [address] [phone]         Discharge Medications    Medication List     As of 02/15/2012 11:20 AM    STOP taking these medications         metoprolol 50 MG tablet   Commonly known as: LOPRESSOR      triamterene-hydrochlorothiazide 75-50 MG per tablet   Commonly known as: MAXZIDE      TAKE these medications         ALPRAZolam 1 MG tablet   Commonly known as: XANAX   Take 1 mg by mouth 3 (three) times daily as needed. For anxiety.      aspirin 81 MG tablet   Take 1 tablet (81 mg total) by mouth daily.      atorvastatin 80 MG tablet   Commonly known as: LIPITOR   Take 1 tablet (80 mg total) by mouth daily at 6 PM.      carisoprodol 350 MG tablet   Commonly known as: SOMA   Take 350 mg by mouth 4 (four) times daily as needed. For pain.      carvedilol 6.25 MG tablet   Commonly known as: COREG   Take 1 tablet (6.25 mg total) by mouth  2 (two) times daily with a meal.      levothyroxine 100 MCG tablet   Commonly known as: SYNTHROID, LEVOTHROID   Take 100 mcg by mouth daily.      nicotine 7 mg/24hr patch   Commonly known as: NICODERM CQ - dosed in mg/24 hr   Place 1 patch onto the skin daily.      nitroGLYCERIN 0.4 MG SL tablet   Commonly known as: NITROSTAT   Place 1 tablet (0.4 mg total) under the tongue every 5 (five) minutes x 3 doses as needed for chest pain.      pantoprazole 40 MG tablet   Commonly known as: PROTONIX   Take 1 tablet (40 mg total) by mouth daily at 12 noon.      prasugrel 10 MG Tabs   Commonly known as: EFFIENT   Take 1 tablet (10 mg total) by mouth daily.       Outstanding Labs/Studies  Follow-up lipids/lft's in 8 weeks.  Duration of Discharge Encounter   Greater than 30 minutes including physician time.  Signed, Murray Hodgkins NP 02/15/2012, 11:20 AM   See my note from earlier today.  Mertie Moores, MD

## 2012-02-15 NOTE — Progress Notes (Signed)
    Subjective:  Constant pain around lateral side of left chest, unchanged. Worse with lying on left side, no change with walking. No dyspnea or other complaints. She remains emotional and tearful especially when around family members.  Has ambulated with rehab and did well.  Objective:  Vital Signs in the last 24 hours: Temp:  [97.6 F (36.4 C)-98.4 F (36.9 C)] 97.8 F (36.6 C) (11/02 0800) Pulse Rate:  [81-94] 81  (11/02 0400) Resp:  [18-20] 18  (11/02 0400) BP: (92-118)/(72-87) 103/72 mmHg (11/02 0400) SpO2:  [92 %-98 %] 98 % (11/02 0800) Weight:  [174 lb 2.6 oz (79 kg)] 174 lb 2.6 oz (79 kg) (11/02 0609)  Intake/Output from previous day: 11/01 0701 - 11/02 0700 In: 720 [P.O.:720] Out: -   Physical Exam: Pt is alert and oriented, tearful, in NAD HEENT: normal Neck: JVP - normal, carotids 2+= without bruits Lungs: CTA bilaterally CV: RRR without murmur or gallop Abd: soft, NT, Positive BS, no hepatomegaly Ext: no C/C/E, distal pulses intact and equal Skin: warm/dry no rash   Lab Results: No results found for this basename: WBC:2,HGB:2,PLT:2 in the last 72 hours  Basename 02/14/12 0505 02/13/12 0525  NA 135 134*  K 3.5 3.8  CL 101 103  CO2 24 21  GLUCOSE 122* 95  BUN 27* 30*  CREATININE 2.29* 2.48*    Basename 02/14/12 0505 02/12/12 1828  TROPONINI >20.00* >20.00*   Tele: sinus rhythm  I have reviewed the cath films.  Assessment/Plan:  1. Acute anterior STEMI - s/p left main PCI. Overall stable on current med Rx.  HR and BP are better.  Will increase Coreg to 6.25 BID.  BP is soft so would avoid ACE especially in setting of creatinine 2.0-2.5 mg/dL. This can be added as outpatient. She should be ready for discharge tomorrow, but would benefit from another day of rehab, education. Follow-up [employer]. She should have transition of care follow-up.  DC to home on:  ASA 81 Coreg 6.25 BID Effient 10 protonix lipitor 80 NTG Synthroid 100  mcg     2. HTN - BP now low after her MI. Continue coreg 3.125 mg BID.  3. Tobacco - cessation counseling done.  4. Chronic Kidney diseae - stage IV - Creatinine appears stable - even after cath.  Follow up with medical doctor.   Darden Amber., M.D. 02/15/2012, 10:10 AM

## 2012-02-15 NOTE — Progress Notes (Signed)
CARDIAC REHAB PHASE I   PRE:  Rate/Rhythm: 97 SR  BP:  Supine:   Sitting: 109/78  Standing:    SaO2: 98 RA  MODE:  Ambulation: 1090 ft   POST:  Rate/Rhythem: 114 ST  BP:  Supine:   Sitting: 119/90  Standing:    SaO2: 99 RA 0920-0940 Pt tolerated ambulation well without c/o of cp or SOB with walking. VS stable Pt c/o of left sided chest discomfort with touching the skin, soreness. No change in the discomfort with walking.She denies any questions related to education provided.  Angelica Herrera

## 2012-02-17 ENCOUNTER — Telehealth: Payer: Self-pay

## 2012-02-17 NOTE — Telephone Encounter (Signed)
No answer tcm attempt #1

## 2012-02-17 NOTE — Telephone Encounter (Signed)
tcm

## 2012-02-17 NOTE — Telephone Encounter (Signed)
Message copied by Minus Liberty on Mon Feb 17, 2012 10:40 AM ------      Message from: Gillie Manners      Created: Mon Feb 17, 2012 10:24 AM      Regarding: TCM pt       TCM pt scheduled for 11/8

## 2012-02-17 NOTE — Telephone Encounter (Signed)
Pt called back Says she has been feeling ok since d/c Main issue is depression/tearfulness over stressors in life- dtr attempted suicide immediately prior to hosp. Admission; she picked her up from rehab today and is happier today Confirms she has all meds from d/c and confirms compliance Denies cp Has stopped smoking Confirms appt with Dr. Fletcher Anon 02/21/12

## 2012-02-19 ENCOUNTER — Telehealth: Payer: Self-pay | Admitting: *Deleted

## 2012-02-19 ENCOUNTER — Telehealth: Payer: Self-pay

## 2012-02-19 ENCOUNTER — Other Ambulatory Visit: Payer: Self-pay

## 2012-02-19 NOTE — Telephone Encounter (Signed)
Prior auth needed for Effient Pt confirms she is out of effient and does not have any for today, took last dose yesterday We are out of  Samples We contacted Effient rep, Gerald Stabs, who says he will have his partner bring some samples today He also says pt should have received free 30 day voucher We should give her one if she did not receive I asked pt about this. She says it was a copay card and was told by pharmacists she could not use d/t medicaid. i explained voucher is diff. She did not receive this I had pt hold while I called pharmacy to see if they would honor voucher. They tell me they will honor I told pt I would leave voucher at [facility] for her to pick up She will come by today after appt at 1 pm today to pick up voucher and will have this filled today Has appt 11/8 as well with Dr. Fletcher Anon

## 2012-02-19 NOTE — Telephone Encounter (Signed)
Cheryl from [facility] in [place] office called to see if we have samples of Effient 10 mg for pt. A 10 mg/28 sample pills placed at the front desk. Malachy Mood is aware and let pt know. According to [facility], pt agreed to  Come to peak up the medicine samples to the [address] office.

## 2012-02-21 ENCOUNTER — Encounter: Payer: Self-pay | Admitting: Cardiovascular Disease

## 2012-02-21 ENCOUNTER — Telehealth: Payer: Self-pay

## 2012-02-21 ENCOUNTER — Ambulatory Visit (INDEPENDENT_AMBULATORY_CARE_PROVIDER_SITE_OTHER): Payer: Medicaid Other | Admitting: Cardiovascular Disease

## 2012-02-21 VITALS — BP 98/70 | HR 90 | Ht 67.0 in | Wt 178.2 lb

## 2012-02-21 DIAGNOSIS — E785 Hyperlipidemia, unspecified: Secondary | ICD-10-CM

## 2012-02-21 DIAGNOSIS — I2510 Atherosclerotic heart disease of native coronary artery without angina pectoris: Secondary | ICD-10-CM

## 2012-02-21 DIAGNOSIS — N184 Chronic kidney disease, stage 4 (severe): Secondary | ICD-10-CM

## 2012-02-21 DIAGNOSIS — I10 Essential (primary) hypertension: Secondary | ICD-10-CM

## 2012-02-21 DIAGNOSIS — I2589 Other forms of chronic ischemic heart disease: Secondary | ICD-10-CM

## 2012-02-21 DIAGNOSIS — R0602 Shortness of breath: Secondary | ICD-10-CM

## 2012-02-21 DIAGNOSIS — I255 Ischemic cardiomyopathy: Secondary | ICD-10-CM

## 2012-02-21 DIAGNOSIS — R079 Chest pain, unspecified: Secondary | ICD-10-CM

## 2012-02-21 NOTE — Assessment & Plan Note (Signed)
Continue high dose atorvastatin given her recent myocardial infarction. She will need a followup lipid and liver profile in one to 2 months.

## 2012-02-21 NOTE — Telephone Encounter (Signed)
Message copied by Minus Liberty on Fri Feb 21, 2012  4:02 PM ------      Message from: Kathlyn Sacramento A      Created: Fri Feb 21, 2012  3:59 PM      Regarding: labs       I forgot to order BMP. Schedule it for next week. Ask her if she has a nephrologist.

## 2012-02-21 NOTE — Patient Instructions (Addendum)
Continue same medications. Follow up in 1 month.  

## 2012-02-21 NOTE — Assessment & Plan Note (Signed)
The patient seems to be stable from a cardiac standpoint. However, she is suffering from significant stress and anxiety related to her daughter's situation. I advised her to attend cardiac rehabilitation. She is to continue dual antiplatelet therapy for a minimum of one year. She quit smoking since her myocardial infarction. I had a prolonged discussion with her about the importance of healthy lifestyle changes.

## 2012-02-21 NOTE — Assessment & Plan Note (Signed)
Continue treatment with Coreg. I do not recommend an ACE inhibitor or ARB at this time due to low blood pressure and advanced chronic kidney disease.

## 2012-02-21 NOTE — Assessment & Plan Note (Signed)
>>  ASSESSMENT AND PLAN FOR CKD (CHRONIC KIDNEY DISEASE), STAGE IV (HCC) WRITTEN ON 02/21/2012  3:57 PM BY ARIDA, MUHAMMAD A, MD  We'll have the patient come for basic metabolic profile as a followup. She will need to establish with nephrology if not already done.

## 2012-02-21 NOTE — Assessment & Plan Note (Signed)
Her blood pressure is somewhat low but given her cardiomyopathy, I will continue the same dose carvedilol.

## 2012-02-21 NOTE — Progress Notes (Signed)
HPI  This is a 41 year old female who is here today for followup visit after recent hospitalization at [facility]. She presented on October 29 with acute anterolateral ST elevation myocardial infarction. She underwent emergent cardiac catheterization by Dr. Burt Knack which showed flush occlusion of left main coronary artery. There was significant difficulty in engaging the left main. Balloon angioplasty followed by drug-eluting stent placement was performed. The patient had significant hemodynamic instability with ventricular tachycardia and hypotension. All of this gradually improved. She was noted to have severe mitral regurgitation initially. However, subsequent echocardiogram showed an ejection fraction of 45% with no significant mitral regurgitation. The patient continued to have atypical musculoskeletal type chest pain after the procedure. She has been experiencing significant stress and anxiety after the recent hospitalization of her 11 year old daughter with an apparent suicide attempt. The patient is tearful. She is using Xanax 3 times daily around-the-clock. She is taking all her cardiac medications. She quit smoking since her myocardial infarction. She had difficulty in obtaining Effient initially and was given samples by [place]. We are in the process of getting approval from Medicaid.  No Known Allergies   Current Outpatient Prescriptions on File Prior to Visit  Medication Sig Dispense Refill  . ALPRAZolam (XANAX) 1 MG tablet Take 1 mg by mouth 3 (three) times daily as needed. For anxiety.      Marland Kitchen aspirin 81 MG tablet Take 1 tablet (81 mg total) by mouth daily.  30 tablet    . atorvastatin (LIPITOR) 80 MG tablet Take 1 tablet (80 mg total) by mouth daily at 6 PM.  30 tablet  6  . carisoprodol (SOMA) 350 MG tablet Take 350 mg by mouth 4 (four) times daily as needed. For pain.      . carvedilol (COREG) 6.25 MG tablet Take 1 tablet (6.25 mg total) by mouth 2 (two) times daily with a meal.  30 tablet  6    . gabapentin (NEURONTIN) 600 MG tablet Take 600 mg by mouth 3 (three) times daily.      . nicotine (NICODERM CQ - DOSED IN MG/24 HR) 7 mg/24hr patch Place 1 patch onto the skin daily.  28 patch    . nitroGLYCERIN (NITROSTAT) 0.4 MG SL tablet Place 1 tablet (0.4 mg total) under the tongue every 5 (five) minutes x 3 doses as needed for chest pain.  25 tablet  3  . pantoprazole (PROTONIX) 40 MG tablet Take 1 tablet (40 mg total) by mouth daily at 12 noon.  30 tablet  6  . prasugrel (EFFIENT) 10 MG TABS Take 1 tablet (10 mg total) by mouth daily.  30 tablet  6     Past Medical History  Diagnosis Date  . HTN (hypertension)   . Hyperlipidemia   . Tobacco abuse   . Obesity   . CKD (chronic kidney disease), stage IV   . Ischemic cardiomyopathy     a. 02/2012 Echo: EF 40-45%, ant/lat HK, Gr1 DD, Mild MR, PASP 43mmHg.  Marland Kitchen NSVT (nonsustained ventricular tachycardia)     a. 02/2012 post-MI  . Hypothyroidism   . CAD (coronary artery disease)     a. 02/2012 s/p anterolateral STEMI/Cath/PCI: LM 100 (3.0x15 DES ), LAD sm, severely diseased (PTCA w/ 1.21mm balloon), LCX nl, RI moderate size, nl, RCA 30-31m, AM/PDA/PLA nl, R->L collats noted.     Past Surgical History  Procedure Date  . Abdominal hysterectomy   . Dilation and curettage of uterus   . Cardiac catheterization 2013  [facility] s/p stent     Family History  Problem Relation Age of Onset  . Hypertension Father   . Hypertension Mother   . Hyperlipidemia Mother      History   Social History  . Marital Status: Single    Spouse Name: N/A    Number of Children: N/A  . Years of Education: N/A   Occupational History  . Not on file.   Social History Main Topics  . Smoking status: Former Smoker -- 1.0 packs/day for 28 years    Types: Cigarettes  . Smokeless tobacco: Not on file  . Alcohol Use: Yes  . Drug Use: Yes    Special: Marijuana  . Sexually Active:    Other Topics Concern  . Not on file   Social History Narrative   . No narrative on file     PHYSICAL EXAM   BP 98/70  Pulse 90  Ht 5\' 7"  (1.702 m)  Wt 178 lb 4 oz (80.854 kg)  BMI 27.92 kg/m2 Constitutional: She is oriented to person, place, and time. She appears well-developed and well-nourished. No distress.  HENT: No nasal discharge.  Head: Normocephalic and atraumatic.  Eyes: Pupils are equal and round. Right eye exhibits no discharge. Left eye exhibits no discharge.  Neck: Normal range of motion. Neck supple. No JVD present. No thyromegaly present.  Cardiovascular: Normal rate, regular rhythm, normal heart sounds. Exam reveals no gallop and no friction rub. No murmur heard.  Pulmonary/Chest: Effort normal and breath sounds normal. No stridor. No respiratory distress. She has no wheezes. She has no rales. She exhibits no tenderness.  Abdominal: Soft. Bowel sounds are normal. She exhibits no distension. There is no tenderness. There is no rebound and no guarding.  Musculoskeletal: Normal range of motion. She exhibits no edema and no tenderness.  Neurological: She is alert and oriented to person, place, and time. Coordination normal.  Skin: Skin is warm and dry. No rash noted. She is not diaphoretic. No erythema. No pallor.  Psychiatric: She has a normal mood and affect. Her behavior is normal. Judgment and thought content normal.  Right groin: No hematoma or bruit.   EKG: Sinus  Rhythm  -Left axis.   -  Nonspecific T-abnormality.   ABNORMAL    ASSESSMENT AND PLAN

## 2012-02-21 NOTE — Assessment & Plan Note (Addendum)
We'll have the patient come for basic metabolic profile as a followup. She will need to establish with nephrology if not already done.

## 2012-02-21 NOTE — Telephone Encounter (Signed)
I spoke with pt's pharmacy. They tell me pt's Effient was approved by insurance and will cost pt $3/month. They will go ahead and fill RX. Pt has samples and voucher as well.

## 2012-02-24 ENCOUNTER — Other Ambulatory Visit: Payer: Self-pay

## 2012-02-24 DIAGNOSIS — I10 Essential (primary) hypertension: Secondary | ICD-10-CM

## 2012-02-24 NOTE — Telephone Encounter (Signed)
Per pt, she does not have nephrologist Coming in for [facility] Thursday

## 2012-02-24 NOTE — Telephone Encounter (Signed)
Ok. I will review the results at that time and decide if she needs to see a nephrologist.

## 2012-02-24 NOTE — Telephone Encounter (Signed)
Pt informed. tx to [facility] to make appt

## 2012-02-27 ENCOUNTER — Ambulatory Visit (INDEPENDENT_AMBULATORY_CARE_PROVIDER_SITE_OTHER): Payer: Medicaid Other

## 2012-02-27 ENCOUNTER — Ambulatory Visit (INDEPENDENT_AMBULATORY_CARE_PROVIDER_SITE_OTHER): Payer: Medicaid Other | Admitting: Cardiovascular Disease

## 2012-02-27 VITALS — BP 114/82 | HR 92 | Ht 67.0 in

## 2012-02-27 DIAGNOSIS — I10 Essential (primary) hypertension: Secondary | ICD-10-CM

## 2012-02-27 DIAGNOSIS — R079 Chest pain, unspecified: Secondary | ICD-10-CM

## 2012-02-27 NOTE — Progress Notes (Signed)
Pt here for labs She began to c/o chest pain and sob Says she was "in bed all day yesterday" because of not feeling well She is very tearful and says she is under a lot of stress  She was brought back to a clinic room and EKG was performed This was reviewed by Dr. Rockey Situ and compared to previous EKG He says EKG is unchanged and advised me to tell pt to try NTG PRN chest discomfort and to f/u with PCP re: possible medication adjustments for anxiety/depression  I informed pt that EKG was unchanged and Dr. Donivan Scull advice to try NTG SL PRN CP She asks when to take this I explained ok to take when she has CP, but to be cautious since this can drop BP She does not check BP at home She will pay attention to symptoms of dizziness, etc if she does take this  I called her PCP for her and made appt for today. They can see her now I advised her to go to PCP now She will go now

## 2012-02-28 LAB — BASIC METABOLIC PANEL
BUN/Creatinine Ratio: 11 (ref 9–23)
CO2: 17 mmol/L — ABNORMAL LOW (ref 19–28)
Chloride: 105 mmol/L (ref 97–108)
GFR calc Af Amer: 57 mL/min/{1.73_m2} — ABNORMAL LOW (ref >59)
Potassium: 4.3 mmol/L (ref 3.5–5.2)
Sodium: 141 mmol/L (ref 134–144)

## 2012-03-06 ENCOUNTER — Telehealth: Payer: Self-pay

## 2012-03-06 NOTE — Telephone Encounter (Signed)
Spoke with Angelica Herrera with insurance company regarding the prior authorization has been approved from 02/19/2012 to 02/14/2012 for Effient 10 mg take one tablet daily. Pharmacy notified as well that effient has been approved.

## 2012-03-06 NOTE — Patient Instructions (Addendum)
Followup visit with your primary care physician has been arranged

## 2012-03-17 ENCOUNTER — Encounter
Admit: 2012-03-17 | Disposition: A | Discharge status: Home / Self Care | Payer: Self-pay | Admitting: Cardiovascular Disease

## 2012-03-23 ENCOUNTER — Ambulatory Visit: Payer: Medicaid Other | Admitting: Cardiovascular Disease

## 2012-04-05 ENCOUNTER — Telehealth: Payer: Self-pay | Admitting: Physician Assistant

## 2012-04-05 NOTE — Telephone Encounter (Signed)
Patient called the answering service c/o lower extremity, abdominal swelling and weight gain for the past 2-3 weeks. She states her Lasix was recently discontinued, and triamterene/HCTZ initiated. She states she has noticed in increased "fluid" since that time. She is urinating quite often. No chest pain, increased shortness of breath, lightheadedness or palpitations. Advised to elevate her legs tonight and avoid salt/fluid intake. Will call the [facility] In office so that she can be seen tomorrow. Concern is that volume overload is either from withdrawal of Lasix with underlying ischemic CM or from acute on CKD from new addition of an aldosterone antagonist and diuretic. Have elected to not advise on taking Lasix this evening. She will need to be evaluated tomorrow and lab work done before diuresis initiated. She is overall stable currently. Advised that if her symptoms worsened, she developed chest pain or increasing shortness of breath, to present to the ED. She understood and agreed. Will forward on to [place] office.   Jacquelynn Cree, PA-C 04/05/2012 4:03 PM

## 2012-04-06 ENCOUNTER — Inpatient Hospital Stay: Payer: Self-pay | Admitting: Internal Medicine

## 2012-04-06 ENCOUNTER — Ambulatory Visit (INDEPENDENT_AMBULATORY_CARE_PROVIDER_SITE_OTHER): Payer: Medicaid Other

## 2012-04-06 ENCOUNTER — Telehealth: Payer: Self-pay | Admitting: Cardiovascular Disease

## 2012-04-06 DIAGNOSIS — R0602 Shortness of breath: Secondary | ICD-10-CM

## 2012-04-06 LAB — COMPREHENSIVE METABOLIC PANEL
Albumin: 3.1 g/dL — ABNORMAL LOW (ref 3.4–5.0)
Alkaline Phosphatase: 87 U/L (ref 50–136)
Anion Gap: 8 (ref 7–16)
BUN: 21 mg/dL — ABNORMAL HIGH (ref 7–18)
Bilirubin,Total: 0.3 mg/dL (ref 0.2–1.0)
Calcium, Total: 8.7 mg/dL (ref 8.5–10.1)
Chloride: 107 mmol/L (ref 98–107)
Co2: 26 mmol/L (ref 21–32)
Creatinine: 1.7 mg/dL — ABNORMAL HIGH (ref 0.60–1.30)
EGFR (African American): 42 — ABNORMAL LOW
EGFR (Non-African Amer.): 37 — ABNORMAL LOW
Glucose: 77 mg/dL (ref 65–99)
Osmolality: 283 (ref 275–301)
Potassium: 3.3 mmol/L — ABNORMAL LOW (ref 3.5–5.1)
SGOT(AST): 15 U/L (ref 15–37)
SGPT (ALT): 20 U/L (ref 12–78)
Sodium: 141 mmol/L (ref 136–145)
Total Protein: 6.1 g/dL — ABNORMAL LOW (ref 6.4–8.2)

## 2012-04-06 LAB — CBC
HCT: 42 %
HGB: 13.7 g/dL
MCH: 29.2 pg
MCHC: 32.7 g/dL
MCV: 89 fL
Platelet: 269 x10 3/mm 3
RBC: 4.7 X10 6/mm 3
RDW: 15.5 % — ABNORMAL HIGH
WBC: 7 x10 3/mm 3

## 2012-04-06 LAB — CK TOTAL AND CKMB (NOT AT ARMC)
CK, Total: 49 U/L
CK-MB: 1.2 ng/mL

## 2012-04-06 LAB — TROPONIN I: Troponin-I: 0.6 ng/mL — ABNORMAL HIGH

## 2012-04-06 NOTE — Telephone Encounter (Signed)
Pt returns call. She states her skin is very swollen and tender to touch. Increasing shortnes of breath with activity. Also having discomfort on her right side. States this has been going on since 2 weeks post discharge from the hospital in November. States she saw her pcp last Friday. Denies chest pain. She will come in today for BMP and BNP as per Dr. Rockey Situ. Horton Chin RN

## 2012-04-06 NOTE — Telephone Encounter (Signed)
Had voicemail from [place] the [place] on call over the weekend, he received a call from Mrs. Santopietro  Stating she has swelling in legs and abdomen.  Her fluid medication has been changed by PCP recently and he wanted [place] to call her to today to follow up on how she is doing.

## 2012-04-07 DIAGNOSIS — I5021 Acute systolic (congestive) heart failure: Secondary | ICD-10-CM

## 2012-04-07 LAB — BASIC METABOLIC PANEL
Anion Gap: 7 (ref 7–16)
BUN/Creatinine Ratio: 16 (ref 9–23)
BUN: 20 mg/dL — ABNORMAL HIGH (ref 7–18)
BUN: 23 mg/dL (ref 6–24)
Chloride: 104 mmol/L (ref 97–108)
Co2: 31 mmol/L (ref 21–32)
Creatinine: 1.73 mg/dL — ABNORMAL HIGH (ref 0.60–1.30)
EGFR (African American): 41 — ABNORMAL LOW
GFR calc Af Amer: 51 mL/min/{1.73_m2} — ABNORMAL LOW (ref >59)
Osmolality: 277 (ref 275–301)
Potassium: 4.1 mmol/L (ref 3.5–5.2)
Sodium: 140 mmol/L (ref 134–144)

## 2012-04-07 LAB — PROTEIN / CREATININE RATIO, URINE
Creatinine, Urine: 75.1 mg/dL (ref 30.0–125.0)
Protein, Random Urine: 7 mg/dL (ref 0–12)

## 2012-04-07 LAB — TROPONIN I: Troponin-I: 0.54 ng/mL — ABNORMAL HIGH

## 2012-04-07 LAB — CK TOTAL AND CKMB (NOT AT ARMC)
CK, Total: 47 U/L (ref 21–215)
CK, Total: 50 U/L (ref 21–215)
CK-MB: 1.5 ng/mL (ref 0.5–3.6)

## 2012-04-07 LAB — MAGNESIUM: Magnesium: 1.6 mg/dL — ABNORMAL LOW

## 2012-04-08 DIAGNOSIS — I059 Rheumatic mitral valve disease, unspecified: Secondary | ICD-10-CM

## 2012-04-08 LAB — BASIC METABOLIC PANEL
BUN: 22 mg/dL — ABNORMAL HIGH (ref 7–18)
Chloride: 97 mmol/L — ABNORMAL LOW (ref 98–107)
EGFR (African American): 48 — ABNORMAL LOW
EGFR (Non-African Amer.): 42 — ABNORMAL LOW
Potassium: 3.9 mmol/L (ref 3.5–5.1)
Sodium: 136 mmol/L (ref 136–145)

## 2012-04-08 LAB — HEMOGLOBIN A1C: Hemoglobin A1C: 5.7 % (ref 4.2–6.3)

## 2012-04-08 LAB — TSH: Thyroid Stimulating Horm: 2.9 u[IU]/mL

## 2012-04-09 DIAGNOSIS — I5020 Unspecified systolic (congestive) heart failure: Secondary | ICD-10-CM

## 2012-04-09 LAB — MAGNESIUM: Magnesium: 1.8 mg/dL

## 2012-04-09 LAB — BASIC METABOLIC PANEL
Calcium, Total: 8.8 mg/dL (ref 8.5–10.1)
Co2: 34 mmol/L — ABNORMAL HIGH (ref 21–32)
Creatinine: 1.59 mg/dL — ABNORMAL HIGH (ref 0.60–1.30)
EGFR (Non-African Amer.): 40 — ABNORMAL LOW

## 2012-04-09 LAB — UR PROT ELECTROPHORESIS, URINE RANDOM

## 2012-04-10 LAB — PROTEIN ELECTROPHORESIS(ARMC)

## 2012-04-12 LAB — CULTURE, BLOOD (SINGLE)

## 2013-03-25 ENCOUNTER — Ambulatory Visit: Payer: Self-pay | Admitting: Internal Medicine

## 2013-10-17 LAB — COMPREHENSIVE METABOLIC PANEL
ALBUMIN: 3.2 g/dL — AB (ref 3.4–5.0)
ALT: 24 U/L (ref 12–78)
ANION GAP: 6 — AB (ref 7–16)
AST: 24 U/L (ref 15–37)
Alkaline Phosphatase: 102 U/L
BILIRUBIN TOTAL: 0.5 mg/dL (ref 0.2–1.0)
BUN: 26 mg/dL — ABNORMAL HIGH (ref 7–18)
CALCIUM: 8.1 mg/dL — AB (ref 8.5–10.1)
CO2: 24 mmol/L (ref 21–32)
Chloride: 108 mmol/L — ABNORMAL HIGH (ref 98–107)
Creatinine: 1.72 mg/dL — ABNORMAL HIGH (ref 0.60–1.30)
EGFR (African American): 41 — ABNORMAL LOW
GFR CALC NON AF AMER: 36 — AB
GLUCOSE: 115 mg/dL — AB (ref 65–99)
Osmolality: 281 (ref 275–301)
Potassium: 4.5 mmol/L (ref 3.5–5.1)
Sodium: 138 mmol/L (ref 136–145)
TOTAL PROTEIN: 6.6 g/dL (ref 6.4–8.2)

## 2013-10-17 LAB — PROTIME-INR
INR: 1.1
PROTHROMBIN TIME: 13.6 s (ref 11.5–14.7)

## 2013-10-17 LAB — CBC
HCT: 46.2 % (ref 35.0–47.0)
HGB: 15.3 g/dL (ref 12.0–16.0)
MCH: 30.4 pg (ref 26.0–34.0)
MCHC: 33.1 g/dL (ref 32.0–36.0)
MCV: 92 fL (ref 80–100)
PLATELETS: 243 10*3/uL (ref 150–440)
RBC: 5.02 10*6/uL (ref 3.80–5.20)
RDW: 14.2 % (ref 11.5–14.5)
WBC: 9.2 10*3/uL (ref 3.6–11.0)

## 2013-10-17 LAB — PRO B NATRIURETIC PEPTIDE: B-TYPE NATIURETIC PEPTID: 5137 pg/mL — AB (ref 0–125)

## 2013-10-17 LAB — TROPONIN I: TROPONIN-I: 0.17 ng/mL — AB

## 2013-10-17 LAB — CK TOTAL AND CKMB (NOT AT ARMC)
CK, Total: 127 U/L
CK-MB: 1.9 ng/mL (ref 0.5–3.6)

## 2013-10-17 LAB — APTT: Activated PTT: 35.3 secs (ref 23.6–35.9)

## 2013-10-18 ENCOUNTER — Observation Stay: Payer: Self-pay | Admitting: Family Medicine

## 2013-10-18 LAB — CK TOTAL AND CKMB (NOT AT ARMC)
CK, TOTAL: 103 U/L
CK, TOTAL: 119 U/L
CK-MB: 1.5 ng/mL (ref 0.5–3.6)
CK-MB: 1.5 ng/mL (ref 0.5–3.6)

## 2013-10-18 LAB — TROPONIN I
TROPONIN-I: 0.18 ng/mL — AB
Troponin-I: 0.16 ng/mL — ABNORMAL HIGH

## 2013-11-03 ENCOUNTER — Emergency Department: Payer: Self-pay | Admitting: Emergency Medicine

## 2014-01-14 ENCOUNTER — Ambulatory Visit: Payer: Self-pay | Admitting: Internal Medicine

## 2014-03-24 ENCOUNTER — Encounter (HOSPITAL_COMMUNITY): Payer: Self-pay | Admitting: Cardiovascular Disease

## 2014-06-22 ENCOUNTER — Emergency Department: Payer: Self-pay | Admitting: Emergency Medicine

## 2014-07-28 ENCOUNTER — Emergency Department: Admit: 2014-07-28 | Disposition: A | Discharge status: Home / Self Care | Payer: Self-pay | Admitting: Internal Medicine

## 2014-08-02 NOTE — Consult Note (Signed)
General Aspect 44 year old female with recent hospitalization at [facility], October 29 with acute anterolateral ST elevation myocardial infarction, cardiac catheterization showing occlusion of left main coronary artery, Balloon angioplasty followed by drug-eluting stent placement was performed, significant hemodynamic instability with ventricular tachycardia and hypotension, noted to have severe mitral regurgitation initially with improvement on subsequent echocardiogram with ejection fraction of 45% with no significant mitral regurgitation. She presents with worsening SOB, generalized swelling and anasarca. Cardiology was consulted for CHF sx.  She reports worsening sx for the past several days,  acute on chronic sx as they started October when she had a heart attack. Since then, she is having shortness of breath with swelling and orthopnea and PND. The patient denies any chest pain. Has some dry cough, No fever.   In the ER, she had O2 saturations of  90% on room air,  on 1 liter, saturating around 95%. The patient complains of pain in the upper back and also in the legs. Received Dilaudid 1 mg, and she is asking for another dose of IV Dilaudid.   The patient continued to have atypical musculoskeletal type chest pain after the procedure. She has been experiencing significant stress and anxiety after the recent hospitalization of her 75 year old daughter with an apparent suicide attempt. The patient is has been tearful on previous office visits. She was using Xanax 3 times daily around-the-clock.  She quit smoking since her myocardial infarction.   PAST MEDICAL HISTORY:   hypertension,  coronary artery disease,  hypothyroidism.  coronary artery disease with stent placement to left main at [facility] in October.    Present Illness ALLERGIES: The patient has no known allergies.   SOCIAL HISTORY: Smokes 1 to 2 cigarettes a day. Occasional alcohol. No drugs.   SURGICAL HISTORY: Significant for coronary  artery disease with stent placement, total abdominal hysterectomy.  echo 10?30/13: - Left ventricle: The cavity size was normal. Wall thickness   was increased in a pattern of mild LVH. Systolic function   was mildly to moderately reduced. The estimated ejection   fraction was in the range of 40% to 45%. Hypokinesis of   the entireanterior and lateral myocardium. Doppler   parameters are consistent with abnormal left ventricular   relaxation (grade 1 diastolic dysfunction). - Mitral valve: Mild regurgitation. - Pulmonary arteries: PA peak pressure: 44m Hg (S).   Home Medications: Medication Instructions Status  hydrochlorothiazide-triamterene 25 mg-37.5 mg capsule 1 cap(s) orally once a day x 30 days Active  pantoprazole 40 mg oral delayed release tablet 1 tab(s) orally once a day Active  levothyroxine 137 mcg (0.137 mg) oral tablet 1 tab(s) orally once a day Active  Effient 10 mg oral tablet 1 tab(s) orally once a day Active  aspirin 81 mg oral tablet, chewable 1 tab(s) orally once a day Active  carvedilol 12.5 mg oral tablet 1 tab(s) orally 2 times a day Active  atorvastatin 40 mg oral tablet 1 tab(s) orally once a day (at bedtime) Active  carisoprodol 350 mg oral tablet 1 tab(s) orally 4 times a day Active  gabapentin 600 mg oral tablet 1 tab(s) orally 3 times a day Active  Xanax 1 mg oral tablet 1 tab(s) orally 3 times a day Active  Nitrostat 0.4 mg sublingual tablet 1 tab(s) sublingual every 5 minutes, As Needed- for Chest Pain  Active   Lab Results:  Routine Chem:  24-Dec-13 00:31    Result Comment TROPONIN - RESULTS VERIFIED BY REPEAT TESTING.  - HIGH PREVIOUSLY  CALLED BY MPG AT [phone]  - ON 04-06-12/RWW  Result(s) reported on 07 Apr 2012 at 01:17AM.   Glucose, Serum  50   BUN  20   Creatinine (comp)  1.73   Sodium, Serum 139   Potassium, Serum  3.2   Chloride, Serum 101   CO2, Serum 31   Calcium (Total), Serum  8.4   Anion Gap 7   Osmolality (calc) 277   eGFR  (African American)  41   eGFR (Non-African American)  36 (eGFR values <32m/min/1.73 m2 may be an indication of chronic kidney disease (CKD). Calculated eGFR is useful in patients with stable renal function. The eGFR calculation will not be reliable in acutely ill patients when serum creatinine is changing rapidly. It is not useful in  patients on dialysis. The eGFR calculation may not be applicable to patients at the low and high extremes of body sizes, pregnant women, and vegetarians.)  Cardiac:  24-Dec-13 00:31    CK, Total 47   CPK-MB, Serum 1.5 (Result(s) reported on 07 Apr 2012 at 01:12AM.)   Troponin I  0.55 (0.00-0.05 0.05 ng/mL or less: NEGATIVE  Repeat testing in 3-6 hrs  if clinically indicated. >0.05 ng/mL: POTENTIAL  MYOCARDIAL INJURY. Repeat  testing in 3-6 hrs if  clinically indicated. NOTE: An increase or decrease  of 30% or more on serial  testing suggests a  clinically important change)   EKG:   Interpretation EKG shows NSR with rate 85 bpm, old anterior MI, LAD    No Known Allergies:   Vital Signs/Nurse's Notes: **Vital Signs.:   24-Dec-13 04:44   Vital Signs Type Routine   Temperature Temperature (F) 97.5   Celsius 36.3   Temperature Source Oral   Pulse Pulse 70   Respirations Respirations 16   Systolic BP Systolic BP 1119  Diastolic BP (mmHg) Diastolic BP (mmHg) 73   Mean BP 86   Pulse Ox % Pulse Ox % 92   Pulse Ox Activity Level  At rest   Oxygen Delivery 2L     Impression 44year old female with recent [facility] with acute anterolateral ST elevation myocardial infarction, cardiac catheterization showing occlusion of left main coronary artery, Balloon angioplasty followed by drug-eluting stent placement was performed, significant hemodynamic instability with ventricular tachycardia and hypotension, noted to have severe mitral regurgitation initially with improvement on subsequent echocardiogram with ejection fraction of  45% with no significant mitral regurgitation. She presents with worsening SOB, generalized swelling and anasarca. Cardiology was consulted for CHF sx.  1) Acute Systolic CHF Told by Dr. MLavera Guiseto stop lasix several weeks ago PA told her to drink gatorade possible twoenty pound weight gain with worsening edema --Continue aggressive diuresis IV BID with potassium repletion COntinue outpt heart failure meds.  2) CAD, s/p PCI left main Hold lovenox, cardiac enz stable Continue asa,. statin   Electronic Signatures: GIda Rogue(MD)  (Signed 24-Dec-13 10:10)  Authored: General Aspect/Present Illness, Home Medications, Labs, EKG , Allergies, Vital Signs/Nurse's Notes, Impression/Plan   Last Updated: 24-Dec-13 10:10 by GIda Rogue(MD)

## 2014-08-02 NOTE — Consult Note (Signed)
PATIENT NAME:  CHAVELY, GARITY MR#:  [id number] DATE OF BIRTH:  1971-02-24  DATE OF CONSULTATION:  04/07/2012  REFERRING PHYSICIAN:  Epifanio Lesches, MD CONSULTING PHYSICIAN:  Rayyan Orsborn Lilian Kapur, MD  REASON FOR CONSULTATION: Acute renal failure.   HISTORY OF PRESENT ILLNESS: The patient is a pleasant 44 year old Caucasian female with past medical history of hypertension, coronary artery disease status post stent placement to the left main coronary artery at [facility] in October 2013, hypothyroidism, chronic systolic heart failure ejection fraction 40 to 45%, anxiety, and hyperlipidemia who presented to [facility] with worsening shortness of breath and increasing lower extremity edema. She has known prior history of chronic systolic heart failure. She reports that she has had steady weight gain over the past several weeks. She has also had increasing lower extremity edema. She also has two pillow orthopnea. She was admitted for acute systolic heart failure exacerbation and started on intravenous Lasix. We are asked to see the patient for evaluation and management of acute renal failure. The patient's baseline creatinine is noted as being 0.91 from 02/08/2008. She has had some recent labs at [facility], but these are currently unavailable to [place]. Today's creatinine is 1.73. In addition, the patient had a urinalysis late last year which was negative for proteinuria. She is followed by Dr. Fletcher Anon as an outpatient for underlying coronary artery disease and Dr. Lavera Guise for her underlying primary care needs. The patient is also noted to be hypokalemic today with a serum potassium of 3.2. Dr. Rockey Situ is also evaluated the patient today.   PAST MEDICAL HISTORY:  1. Hypertension.  2. Coronary artery disease status post stent placement in the left main artery in October 2013. 3. Hypothyroidism.  4. Hyperlipidemia.  5. Chronic systolic heart failure, ejection fraction  40 to 45%.  6. History of tobacco abuse.   ALLERGIES: No known drug allergies.  CURRENT INPATIENT MEDICATIONS: Include alprazolam 1 mg p.o. q.8 hours, aspirin 81 mg daily, Lipitor 40 mg p.o. daily, soma 350 mg p.o. 4 times a day, Coreg 12.5 mg p.o. b.i.d., Lovenox 90 mg subcutaneous q.12 hours, furosemide 40 mg IV q.12 hours, gabapentin 600 mg p.o. t.i.d., Dilaudid 1 to 2 mg IV q.4 hours p.r.n., levothyroxine 0.137 mg p.o. q. 6:00 a.m., nitroglycerin 0.4 mg sublingual q.5 minutes p.r.n. chest pain, Zofran 4 mg IV q.4 hours p.r.n., Protonix 40 mg p.o. q. 6:00 a.m., Effient 10 mg p.o. daily, potassium chloride 10 mEq p.o. daily.   SOCIAL HISTORY: The patient is separated. She lives with her mother-in-law. She has a daughter. She is currently unemployed. She smokes 1 to 2 cigarettes per day. She drinks alcohol occasionally. No illicit drug use.   FAMILY HISTORY: The patient's mother has hypertension, diabetes mellitus, and chronic kidney disease. The patient's father also has hypertension and diabetes mellitus.   REVIEW OF SYSTEMS: CONSTITUTIONAL: Denies fevers or chills. Has experienced some weight gain.  EYES: Denies diplopia, blurry vision.  HEENT: Denies headaches, hearing loss. Denies epistaxis.  CARDIOVASCULAR: Reports shortness of breath with exertion, PND, increasing lower extremity edema.  RESPIRATORY: Denies cough. Does have shortness of breath with exertion.  GASTROINTESTINAL: Denies nausea, vomiting, diarrhea.  GENITOURINARY: Denies frequency, urgency, or dysuria.  MUSCULOSKELETAL: Denies joint pain, swelling, or redness. Does have some lower back pain.  INTEGUMENTARY: Denies skin rashes or lesions.  NEUROLOGIC: Denies focal extremity numbness, weakness, or tingling.  PSYCHIATRIC: Reports anxiety.  ENDOCRINE: Denies polyuria, polydipsia, or polyphagia.  HEMATOLOGIC/LYMPHATIC: Denies easy bruisability, bleeding,  or swollen lymph nodes. ALLERGY/IMMUNOLOGIC: Denies seasonal allergies or  history of immunodeficiency.   PHYSICAL EXAMINATION:  VITAL SIGNS: Temperature 98, pulse 74, respirations 16, blood pressure 102/76, pulse ox 97% on 2 liters.  GENERAL: Well-developed, well-nourished Caucasian female who appears her stated age, currently in no acute distress.  HEENT: Normocephalic, atraumatic. Extraocular movements are intact. Pupils are equal, round, and reactive to light. No scleral icterus. Conjunctivae are pink. No epistaxis noted. Gross hearing intact. Oral mucosa moist.  NECK: Supple and without JVD or lymphadenopathy.  LUNGS: Demonstrate basilar rales. Normal respiratory effort.  HEART: S1, S2. Regular rate and rhythm. No murmurs, rubs, or gallops appreciated.  ABDOMEN: Soft, nontender, nondistended. Bowel sounds positive. No rebound or guarding. No gross organomegaly appreciated.  EXTREMITIES: 2+ pitting edema noted in both lower extremities. No clubbing or cyanosis noted.  NEUROLOGIC: The patient is alert and oriented to time, person, and place. Strength is 5/5 in both upper and lower extremities. Sensation is intact.  MUSCULOSKELETAL: No joint redness, swelling, or tenderness appreciated.  GENITOURINARY: No suprapubic tenderness is noted at this time.  PSYCHIATRIC: The patient has an appropriate affect and appears to have good insight into her current illness.   LABORATORY DATA: Sodium 139, potassium 3.2, chloride 101, CO2 of 31, BUN 20, creatinine 1.73, glucose 50, magnesium 1.6. Troponin 0.54. TSH is 43.2. CBC shows WBCs 7.0, hemoglobin 13.7, hematocrit 43, platelets 269.    IMPRESSION: This is a 44 year old Caucasian female with past medical history of hypertension, coronary artery disease status post left main artery stent placement in October of 2013, hyperlipidemia, anxiety, tobacco abuse, hypothyroidism, and history of total abdominal hysterectomy who presented to [facility] with acute heart failure exacerbation.  PLAN: 1. Acute renal  failure. We suspect that alterations in renal hemodynamics is likely playing a role in her acute renal failure now. Most recent creatinine in [facility] system is 0.91 from October of 2009. She has had more current labs performed at [facility]. Unclear if there is a chronic component to her disease. She has family history of underlying chronic kidney disease as well. For now, agree with the use of Lasix to treat underlying heart failure. This may improve her renal function. We will also proceed with renal ultrasound, SPEP, UPEP, and ANA for further workup. Avoid nephrotoxins as possible. Would avoid ACE inhibitor for now given concern for acute renal failure.  2. Acute systolic heart failure. Awaiting current echo report. Most recent reported EF was 40 to 45% per Cardiology. Continue Coreg as well as Lasix 40 mg IV q.12 hours.  3. Hypokalemia/hypomagnesemia. Serum potassium was low at 3.2. Serum magnesium was also low at 1.6. Hypomagnesemia may potentiate the hypokalemia. I will give magnesium sulfate 2 grams IV today. Continue potassium supplementation as you are doing. Follow-up serum magnesium and potassium in the morning.  3. Hypothyroidism. TSH is quite elevated at age 2.2. Would consider an endocrinology consultation.   I would like to thank Dr. Vianne Bulls for this kind referral. Further plans as the patient progresses.    ____________________________ Tama High, MD mnl:es D: 04/07/2012 11:30:38 ET T: 04/07/2012 12:08:21 ET JOB#: [id number]  cc: Tama High, MD, <Dictator> Tama High MD ELECTRONICALLY SIGNED 04/08/2012 13:57

## 2014-08-02 NOTE — Discharge Summary (Signed)
PATIENT NAME:  Angelica Herrera, Angelica Herrera MR#:  [id number] DATE OF BIRTH:  17-Aug-1970  DATE OF ADMISSION:  04/06/2012 DATE OF DISCHARGE:  04/09/2012  PRIMARY CARE PHYSICIAN:   Cletis Athens, MD  CONSULTING PHYSICIAN:  Minna Merritts, MD  DISCHARGE DIAGNOSES:  1.  Acute congestive heart failure with possible systolic and diastolic dysfunction with ejection fraction 40% to 45%.  2.  Acute renal failure and possible chronic kidney disease.  3.  Non-ST-segment elevation myocardial infarction possibly due to demand ischemia.  4.  Hypotension due to narcotics.  5.  Possible neuropathy.   CONDITION:  Stable.   CODE STATUS:  Full code.   MEDICATIONS:   1.  Pantoprazole 40 mg p.o. daily.  2.  Levothyroxine 137 mcg p.o. daily.  3.  Effient 10 mg p.o. daily.  4.  Aspirin 81 mg p.o. daily.  5.  Coreg 12.5 mg p.o. b.i.d.  6.  Atorvastatin 40 mg p.o. at bedtime.  7.  Carisoprodol 350 mg p.o. tablets 1 tablet 4 times a day.  8.  Gabapentin 600 mg p.o. t.i.d.  9.  Xanax 1 mg p.o. t.i.d.  10.  Nitrostat 0.4 mg sublingual every 5 minutes p.r.n. for chest pain.  11.  Lasix 20 mg p.o. b.i.d.  12.  Start the medication HCTZ/triamterene 25 mg/37.5 mg capsule once a day.   DIET:  Low sodium diet.   ACTIVITY:  As tolerated.   FOLLOWUP CARE:  With Dr. Lavera Guise within 1 week and also follow up with Dr. Juleen China within 1 week.   REASON FOR ADMISSION:  Shortness of breath.   HOSPITAL COURSE:  The patient is a 44 year old Caucasian female with a history of CAD with recent stent placement in October at [facility] who presented to the ED with shortness of breath, generalized swelling and anasarca for 2 to 3 days. The patient's oxygen saturation was 90% on room air when the patient came to the ED. For detailed history and physical examination, please refer to the admission note dictated by Dr. Vianne Bulls. On admission date, the patient's WBC was 7 and hemoglobin 13.7. BNP was 6679. Sodium was 144, potassium  3.3, bicarbonate 26. Chest x-ray shows atelectasis versus pneumonia with pleural fluid at the right lung base. The patient was admitted for acute CHF exacerbation. After admission, the patient had been treated with Lasix 40 mg IV q. 12 hours. In addition for possible pneumonia, the patient was treated with Levaquin, but no signs of pneumonia so antibiotics were discontinued. The patient's echocardiogram on this admission showed an ejection fraction at 40% to 45%. Dr. Rockey Situ evaluated the patient and suggested the patient has congestive heart failure and needs to continue Lasix and for CAD and possible demand ischemia from heart failure, the patient needs to continue aspirin and Effient and the statin. He suggested that the patient can get Lasix 20 mg p.o. b.i.d. and follow up with Dr. Lavera Guise as an outpatient. The patient's symptoms have much improved after aforementioned treatment. No shortness of breath. Leg edema has improved; however, since the patient complains of leg pain, the patient was treated with Dilaudid and Percocet which caused hypotension. She was treated with IV fluids and normal saline bolus. Blood pressure was at the 80s. She was transferred to CCU to start Levophed drip; however, since patient's blood pressure is stable in the 90s, Levophed drip was not started. The patient also has an elevated creatinine and BUN and we requested a nephrology consult. Dr. Juleen China and Dr. Holley Raring evaluated  the patient and suggested the patient may have underlying CKD, but it is stable. The patient needs to follow up with nephrology as an outpatient. The patient had a kidney ultrasound that showed no hydronephrosis. The patient is clinically stable and will be discharged to home today. I discussed the patient's discharge plan with the patient, Dr. Rockey Situ and the case manager.   TIME SPENT:  About 38 minutes.    ____________________________ Demetrios Loll, MD qc:si D: 04/09/2012 13:26:00 ET T: 04/09/2012 21:23:50  ET JOB#: [id number]  cc: Demetrios Loll, MD, <Dictator> Demetrios Loll MD ELECTRONICALLY SIGNED 04/10/2012 15:21

## 2014-08-02 NOTE — H&P (Signed)
PATIENT NAME:  Angelica Herrera, Angelica Herrera MR#:  [id number] DATE OF BIRTH:  1970-07-28  DATE OF ADMISSION:  04/06/2012  PRIMARY DOCTOR: Dr. Cletis Athens.   EMERGENCY ROOM PHYSICIAN: Dr. Lenise Arena.    CHIEF COMPLAINT: Shortness of breath.   HISTORY OF PRESENT ILLNESS: The patient is a 44 year old female patient with history of coronary artery disease and recent stent placement in October in [place]. Came in because of shortness of breath, generalized swelling and anasarca. The patient having these symptoms for the last 2 to 3 days. Got worse. Actually, the patient says that her symptoms started since October when she had a heart attack. Since then, she is having shortness of breath with swelling and orthopnea and PND. The patient denies any chest pain. Has some dry cough. No fever. O2 saturations were 90% on room air when she came. Right now, she is on 1 liter, saturating around 95%. The patient complains of pain in the upper back and also in the legs. Received Dilaudid 1 mg, and she is asking for another dose of IV Dilaudid.   PAST MEDICAL HISTORY: Significant for hypertension, coronary artery disease, hypothyroidism. The patient has coronary artery disease with stent placement in one of the arteries in [facility] in October. She was told she also has 2 arteries which are blocked, but she has collateral circulation. She is seeing Dr. Fletcher Anon for [facility].   ALLERGIES: The patient has no known allergies.   SOCIAL HISTORY: Smokes 1 to 2 cigarettes a day. Occasional alcohol. No drugs.   SURGICAL HISTORY: Significant for coronary artery disease with stent placement, total abdominal hysterectomy.   MEDICATIONS: Aspirin 81 mg daily, atorvastatin 40 mg p.o. daily, Soma 350 mg 4 times daily, Coreg 12.5 mg p.o. b.i.d., Effient 10 mg p.o. daily, Neurontin 600 mg p.o. t.i.d., HCTZ with triamterene 25/37 .5 mg daily, levothyroxine 137 mcg p.o. daily, Nitrostat sublingual as needed for chest pain,  pantoprazole 40 mg p.o. daily, Xanax 1 mg p.o. t.i.d.   REVIEW OF SYSTEMS:   CONSTITUTIONAL: Feels fatigued.  EYES: No blurred vision.  ENT: No tinnitus. No epistaxis. No difficulty swallowing.  RESPIRATORY: Has shortness of breath, orthopnea and PND and pedal edema.  CARDIOVASCULAR: Denies chest pain. Has pedal edema.  GASTROINTESTINAL: No nausea. No vomiting. No abdominal pain.  GENITOURINARY: No dysuria.  ENDOCRINE: Denies any polyuria or nocturia.  INTEGUMENT: No skin rashes.  MUSCULOSKELETAL: Complains of joint pains. Has a history of upper back pain and history of migraines. She is on [place].  PSYCHIATRIC: Has a history of depression.   PHYSICAL EXAMINATION:  VITAL SIGNS: Temperature 97.6, heart rate 90, respirations 22, blood pressure 185/112 when she came, O2 sat is 94 on room air.  GENERAL: She is alert, awake, oriented, in mild distress secondary to pain in her legs.  HEENT:  PERla,eom Intact. No scleral icterus. No conjunctivitis. Hearing is intact.  NECK: No thyroid enlargement. No lymphadenopathy. No masses.  RESPIRATORY: Decreased breath sounds in the bases and has some rales at the bases. No wheezing. Not using accessory muscles of respiration.  CARDIOVASCULAR: S1, S2 regular. PMI not displaced. The patient has 2+ pitting edema bilaterally.  ABDOMEN: Soft, nontender, obese. Bowel sounds present. No hernias.  MUSCULOSKELETAL: Strength 5/5 upper and lower extremities.  SKIN: No skin rashes.  NEUROLOGIC: Cranial nerves II through XII are intact. DTRs are 2+ bilaterally. No dysarthria or aphasia.  PSYCHIATRIC: Oriented to time, place, person.   LAB DATA: BNP is 6679. CK total 49, CPK-MB  1.2. WBC 7, hemoglobin 13.7, hematocrit 42, platelets 269. ELECTROLYTES: Sodium 141, potassium 3.3, chloride 107, bicarb 26, BUN 21, creatinine 1.70, glucose 77. Troponin is 0.60. Chest x-ray shows atelectasis versus pneumonia with pleural fluid at the right lung base. Also has left lung base  subsegmental atelectasis.   EKG shows normal sinus rhythm with left atrial enlargement, 85 beats per minute. No ST-T changes.   ASSESSMENT AND PLAN:  1. A 44 year old female patient with shortness of breath, generalized anasarca, pedal edema and elevated BNP consistent with congestive heart failure exacerbation. The patient had recent coronary artery disease with stent placement in October. The patient is going to be admitted to the hospitalist service. Her echocardiograms are not available here, so I do not know whether it is systolic or diastolic heart failure. Continue the aspirin, Coreg. She is not on any Lasix at home so was started on Lasix 40 intravenous q.12. She received 80 mg in the ER. Continue ins and outs and obtain cardiology evaluation. Obtain records.  2. Coronary artery disease with stent placement: Continue beta blocker, statins and Effient.  3. Pneumonia: The patient already received Levaquin in the ER. Continue Rocephin and Zithromax.  4. Migraines and also generalized body pains: The patient is on Soma at home and Neurontin. Continue them.  5. Hypothyroidism: Continue Synthroid.  6. Elevated troponins with non-ST elevation myocardial infarction, likely due to congestive heart failure exacerbation with pulmonary edema. Cycle the troponins. Continue aspirin, beta blockers, nitrates and full-dose Lovenox and follow up with the cardiologist, Dr. Fletcher Anon.   TIME SPENT ON HISTORY AND PHYSICAL: About 55 minutes.   ____________________________ Epifanio Lesches, MD sk:gb D: 04/06/2012 19:01:49 ET T: 04/06/2012 21:02:07 ET JOB#: [id number]  cc: Epifanio Lesches, MD, <Dictator> Epifanio Lesches MD ELECTRONICALLY SIGNED 04/11/2012 22:38

## 2014-08-02 NOTE — Consult Note (Signed)
   No Known Allergies:   Electronic Signatures: Ida Rogue (MD)  (Signed 24-Dec-13 10:10)  Authored: General Aspect/Present Illness, Home Medications, Allergies   Last Updated: 24-Dec-13 10:10 by Ida Rogue (MD)

## 2014-08-06 NOTE — H&P (Signed)
PATIENT NAME:  Angelica Herrera, GENSKE MR#:  [id number] DATE OF BIRTH:  Feb 10, 1971  DATE OF ADMISSION:  10/18/2013  PRIMARY CARE PHYSICIAN: Dr. Lavera Guise   CARDIOLOGIST: Dr. Rockey Situ  REFERRING PHYSICIAN: Dr. Jimmye Norman  CHIEF COMPLAINT: Chest pain.   HISTORY OF PRESENT ILLNESS: The patient is a 44 year old female with past medical history of congestive heart failure with a recent ejection fraction of 40% to 45% as of December 2013 and a history of coronary artery disease, chronic low back pain and multiple other medical problems, who is presenting to the ED with a chief complaint of one week history of persistent chest pain. This is associated with dizziness and anxiety. The patient is reporting that her chest pain is continuous, and is in the left side of the chest, and hurts with movement and deep breaths. She also reports that whenever she takes sublingual nitroglycerin she feels better. The patient is reporting that she was seen by Dr. Rockey Situ as an outpatient, and also had a stress test done approximately six months ago, with no blockages, as reported by the patient. The patient used to smoke heavily, and she is working on quitting smoking. Right now, she smokes only 3 to 4 cigarettes per day. Chest x-ray has revealed pulmonary venous hypertension and patient's troponin is elevated at 0.17. But when compared to the previous results, the patient's troponin was persistently elevated at 0.55 to 0.68 during her previous admissions. The patient admits that she is under a lot of stress and she is feeling anxious and in tears during my examination.   PAST MEDICAL HISTORY: Coronary artery disease, hypertension, hypothyroidism, anxiety, chronic low back pain, chronic lower extremity edema.   PAST SURGICAL HISTORY: Coronary artery stent placement at [facility] in October 2013, total abdominal hysterectomy.  ALLERGIES: No known drug allergies.   PSYCHOSOCIAL HISTORY: Lives at home with husband. She used to smoke 1  to 2 packs per day but now she is smoking only 2 to 3 cigarettes per day. Occasional intake of alcohol. Denies any illicit drug usage.   FAMILY HISTORY: Both parents have diabetes mellitus.   HOME MEDICATIONS: Xanax 0.5 mg p.o. 3 times a day, soma 350 mg 1 tablet p.o. daily as needed, Plavix 75 mg once daily, Nitrostat 0.4 mg sublingually every five minutes as needed, levothyroxine 150 mg p.o. once daily, Lasix 20 mg p.o. 2 times a day, gabapentin 600 mg 3 times a day, Coreg 25 mg p.o. b.i.d.   REVIEW OF SYSTEMS: CONSTITUTIONAL: Denies any fever. Complaining of fatigue and weakness.  EYES: Denies blurry vision, double and glaucoma.  ENT: Denies tinnitus, epistaxis, discharge.  RESPIRATORY: Denies cough, chronic obstructive pulmonary disease.  CARDIOVASCULAR: Complaining of left-sided chest pain, which is reproducible. Denies any palpitations or syncope.  GASTROINTESTINAL: Denies nausea, vomiting, diarrhea, abdominal pain, hematemesis, or melena.  GENITOURINARY: No dysuria, hematuria, renal calculi. ENDOCRINE:  Denies polyuria, nocturia. Has a chronic history of hypothyroidism.  HEMATOLOGIC AND LYMPHATIC: No anemia, easy bruising or bleeding.  INTEGUMENTARY: No acne, rash, lesions.  MUSCULOSKELETAL: No joint pain in the neck. Has chronic low back pain. Denies gout. NEUROLOGIC: Denies vertigo, ataxia, dementia.  PSYCHIATRIC: No ADD, OCD. Has a chronic history of anxiety.   PHYSICAL EXAMINATION: VITAL SIGNS: Temperature 97.6, pulse 68, respirations 20, blood pressure is 156/106, pulse oximetry is 96%.  GENERAL APPEARANCE: Not in acute distress. The patient seems to be anxious. HEENT: Normocephalic, atraumatic. Pupils are equally reacting to light and accommodation. No scleral icterus. No conjunctival injection. No sinus  tenderness. No postnasal drip.  NECK: Supple. No JVD. No thyromegaly. Range of motion intact. LUNGS: Clear to auscultation bilaterally. No accessory muscle use. Positive  reproducible chest wall tenderness in the left inframammary area. CARDIAC: S1 and S2 normal. Regular rate and rhythm. No murmurs.  GASTROINTESTINAL: Soft. Bowel sounds are positive in all four quadrants. Nontender, nondistended. No hepatosplenomegaly. No masses are noted.  NEUROLOGIC: Awake, alert and oriented x3. Cranial nerves II to XII are grossly intact. Motor and sensory are intact. Reflexes are 2+.  EXTREMITIES: 2+ pitting edema is present, which is chronic in nature. No cyanosis. No clubbing.  SKIN: Warm to touch. Normal turgor. No rashes. No lesions.  MUSCULOSKELETAL: No joint effusion, tenderness. Range of motion is normal.  PSYCHIATRIC: Anxious mood and affect.   LABS AND IMAGING STUDIES: Accu-Cheks is 77. BNP 5137. Her previous BNP in December 2013 was 6679. Glucose 115, BUN is 26, creatinine 1.72. The patient has chronic renal insufficiency, and her baseline seems to be between 1.53 to 1.70. Right now, her creatinine is at 1.72, sodium normal, potassium 4.5, chloride 108, CO2 24, GFR is 36, anion gap 6. Serum osmolality is normal. Calcium is 8.1. Total CK 127, CPK MB is 1.9, troponin 0.17. Back in December 2013, her troponin was at around 0.55. CBC is normal. PT-INR is normal. Chest x-ray, portable: Chronic cardiomegaly and pulmonary venous hypertension, mild residual right pleural thickening versus small pleural effusion. A 12-lead EKG: Normal sinus rhythm at 68 beats per minute, left atrial enlargement, incomplete right bundle branch block.   ASSESSMENT AND PLAN: A 44 year old female coming to the Emergency Department with a chief complaint of one week history of persistent chest pain associated with some dizziness and weakness. Admitting anxiety. Reports that her left-sided chest pain is hurting whenever she turns her torso.  1.  Atypical chest pain, reproducible, probably noncardiac in origin. Will admit her to telemetry, cycle cardiac biomarkers, put her on acute coronary syndrome  protocol, consider cardiology consult if her troponins are trending up. It is most likely costochondritis, as this is reproducible chest pain.  2.  Chronic history of anxiety. Continue her home medication, Xanax.  3.  Chronic lower extremity edema. Continue Lasix 25 mg p.o. b.i.d.  4.  Coronary artery disease. The patient is on acute coronary syndrome protocol. Will continue oxygen, nitroglycerin, aspirin, beta blocker and statin.  5.  Chronic renal insufficiency. Renal function seems to be at baseline. We will monitor renal function closely.  6.  We will provide her gastrointestinal and deep vein thrombosis prophylaxis.   Plan of care discussed with the patient. The patient is aware of the plan.   TOTAL TIME SPENT ON ADMISSION: 50 minutes.    ____________________________ Nicholes Mango, MD ag:cg D: 10/18/2013 00:43:52 ET T: 10/18/2013 01:54:58 ET JOB#: [id number]  cc: Nicholes Mango, MD, <Dictator> Nicholes Mango MD ELECTRONICALLY SIGNED 10/19/2013 0:40

## 2014-08-06 NOTE — Discharge Summary (Signed)
PATIENT NAME:  Angelica Herrera, WOOLARD MR#:  [id number] DATE OF BIRTH:  11-08-1970  DATE OF ADMISSION:  10/17/2013 DATE OF DISCHARGE:  10/18/2013  REASON FOR ADMISSION: Chest pain.   DISCHARGE DIAGNOSES: 1.  Atypical chest pain, reproducible. 2.  Chronic anxiety. 3.  Chronic lower extremity edema.  4.  Coronary artery disease.  5.  Chronic renal insufficiency.  6.  History of hypothyroidism.  7.  Chronic lower back pain.  8.  History of heart failure, systolic, no exacerbation, compensated.   DISPOSITION: Home.   DISCHARGE MEDICATIONS:  1.  Coreg 25 mg twice daily. 2.  Plavix 75 mg daily. 3.  Levothyroxine 150 mg once a day. 4.  Nitrostat sublingual as needed for chest pain. 5.  Xanax 0.5 mg 3 times daily. 6.  Gabapentin 600 mg 3 times daily. 7.  Soma 350 mg as needed. 8.  Lasix 20 mg twice daily. The patient had an increase of the dose during this hospitalization due to edema not getting better. She will be taking 40 mg every morning and 20 mg every evening for the next 3 days and then decrease back to the normal dose of 20 mg twice daily.  9.  Prednisone 60 mg p.o. once a day for 3 days.  10.  Norco 5/325 mg every 8 hours as needed for pain.   DISCHARGE FOLLOWUP: Follow up with Dr. Lavera Guise. Dr. Lavera Guise please check TSH in the office as the patient has symptoms that  might be referred with weakness, possible depression. This was not checked in the hospital because the patient was going to be discharged right away.    HOSPITAL COURSE: This is a very nice 44 year old female with history of systolic congestive heart failure with ejection fraction 40% to 45% back in December of 2013. The patient also has history of coronary artery disease and chronic back pain. The patient presented to the Emergency Department on 10/18/2013 with the concern of persistent chest pain. There was some association of dizziness and the patient was really anxious. She continues to say that she feels weak and drained  all the time. I asked her about symptoms of depression and she agreed that she might have some. We did not start any therapy during this hospitalization, although I recommended her to follow up with Dr. Lavera Guise for a referral to psychiatry to evaluate the depression part and how this could be playing a role to her symptoms.   So the patient came into the Emergency Department complaining of pain that was worse with movement, with deep breaths. She has seen cardiology as an outpatient and she also had a stress test done 6 months ago, which did not have any significant blockages. It was reported low risk.   The patient still smokes for what smoking cessation counseling was given to the patient.  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   Overall, the patient was admitted for atypical chest pain rule out. The chest pain was coming down behind her back and it was absolutely reproducible. Noncardiac in origin. The patient had serial troponins and no changes on her EKG from previous. Her troponins remained in between 0.17, 0.18, and then 0.16. Again, this is actually a chronic troponin elevation, what we will call troponin leak.   Her creatinine was 1.72, which is about her baseline and she had a slight elevation of her  BNP of 5,100, which is also in her case chronically elevated as she was not having any significant symptoms of exacerbation of her congestive heart failure. She did have some edema, but she states that the edema has been persistent there.   Her chest x-ray showed chronic cardiomegaly with some pulmonary venous hypertension, mild residual right pleural effusion. The patient was given steroids for possible back pain exacerbation with mild radiculopathy and exacerbation of the pain that she always has, now radiating into the chest.   Overall, the patient had good course.  She was ruled out for acute coronary syndrome and was discharged with close followup with her cardiologist, Dr. Lavera Guise.   As far as her other medical problems, smoking, the patient was counseled about smoking cessation. As far as her chronic kidney disease, it seems to be stable with a creatinine around 1.7. Continue to monitor. Her lower extremity edema is chronic and we increased her Lasix in the morning to 40 mg for 3 days and then decrease back to 20 mg in the morning and 20 in the evening. For her coronary artery disease, the patient had aspirin, beta blocker, statins and nitroglycerin. No changes on EKG.   TIME SPENT: About 45 minutes discharging this patient on day of discharge talking about the location of the chest pain and about musculoskeletal problems.  ____________________________ Morning Glory Sink, MD rsg:sb D: 10/22/2013 07:16:25 ET T: 10/22/2013 07:38:05 ET JOB#: [id number]  cc: Trimble Sink, MD, <Dictator> Cletis Athens, MD Cristi Loron MD ELECTRONICALLY SIGNED 10/28/2013 22:55

## 2014-08-11 ENCOUNTER — Emergency Department
Admit: 2014-08-11 | Disposition: A | Discharge status: Home / Self Care | Payer: Self-pay | Admitting: Emergency Medicine

## 2014-08-11 LAB — BASIC METABOLIC PANEL
ANION GAP: 6 — AB (ref 7–16)
BUN: 17 mg/dL
Calcium, Total: 8.6 mg/dL — ABNORMAL LOW
Chloride: 110 mmol/L
Co2: 19 mmol/L — ABNORMAL LOW
Creatinine: 1.24 mg/dL — ABNORMAL HIGH
EGFR (Non-African Amer.): 53 — ABNORMAL LOW
GLUCOSE: 110 mg/dL — AB
Potassium: 3.7 mmol/L
SODIUM: 135 mmol/L

## 2014-08-11 LAB — CBC
HCT: 47.8 % — ABNORMAL HIGH (ref 35.0–47.0)
HGB: 15.4 g/dL (ref 12.0–16.0)
MCH: 30.8 pg (ref 26.0–34.0)
MCHC: 32.3 g/dL (ref 32.0–36.0)
MCV: 96 fL (ref 80–100)
Platelet: 232 10*3/uL (ref 150–440)
RBC: 5.01 10*6/uL (ref 3.80–5.20)
RDW: 15 % — AB (ref 11.5–14.5)
WBC: 7.9 10*3/uL (ref 3.6–11.0)

## 2014-08-11 LAB — TROPONIN I: Troponin-I: <0.03 ng/mL

## 2014-12-20 ENCOUNTER — Other Ambulatory Visit: Payer: Self-pay | Admitting: Family Medicine

## 2015-01-31 ENCOUNTER — Other Ambulatory Visit: Payer: Self-pay | Admitting: Family Medicine

## 2015-01-31 NOTE — Telephone Encounter (Signed)
Last Seen 08-17-14, requesting refill on alprazolam and gabapentin. Please send to [facility]

## 2015-01-31 NOTE — Telephone Encounter (Signed)
Refill request was sent to Dr. Bobetta Lime for approval and submission.  ALPRAZolam 0.5MG , 1 (one) Tablet two times daily, as needed for severe anxiety only, #60, 30 days starting 08/17/2014, Ref. x5. Active. Associated Diagnosis:Anxiety and depression (300.00, 311  F41.9, F32.9) Recorded 08/17/2014 10:27 AM by Bobetta Lime, MD, Office Visit.  Gabapentin 300MG , 1 (one) Capsule three times daily, #90, 30 days starting 08/17/2014, Ref. x5. Active. Associated Diagnosis:Chronic pain of multiple joints (719.49  M25.50) Recorded 08/17/2014 12:02 PM by Bobetta Lime, MD, Office Visit. ePrescribed to [id number], 08/17/2014 12:02 PM

## 2015-02-01 MED ORDER — ALPRAZOLAM 0.5 MG PO TABS
ORAL_TABLET | ORAL | Status: DC
Start: 2015-02-01 — End: 2015-03-17

## 2015-02-01 MED ORDER — GABAPENTIN 300 MG PO CAPS: 300.0000 mg | ORAL_CAPSULE | Freq: Three times a day (TID) | ORAL | Status: DC

## 2015-02-27 ENCOUNTER — Ambulatory Visit: Payer: Self-pay | Admitting: Family Medicine

## 2015-02-28 ENCOUNTER — Other Ambulatory Visit: Payer: Self-pay | Admitting: Family Medicine

## 2015-03-01 ENCOUNTER — Other Ambulatory Visit: Payer: Self-pay | Admitting: Family Medicine

## 2015-03-03 ENCOUNTER — Other Ambulatory Visit: Payer: Self-pay | Admitting: Family Medicine

## 2015-03-03 NOTE — Telephone Encounter (Signed)
Patient requesting refill. 

## 2015-03-07 ENCOUNTER — Ambulatory Visit: Payer: Self-pay | Admitting: Family Medicine

## 2015-03-07 ENCOUNTER — Other Ambulatory Visit: Payer: Self-pay | Admitting: Family Medicine

## 2015-03-17 ENCOUNTER — Encounter: Payer: Self-pay | Admitting: Family Medicine

## 2015-03-17 ENCOUNTER — Ambulatory Visit (INDEPENDENT_AMBULATORY_CARE_PROVIDER_SITE_OTHER): Payer: Medicaid Other | Admitting: Family Medicine

## 2015-03-17 ENCOUNTER — Other Ambulatory Visit: Payer: Self-pay | Admitting: Family Medicine

## 2015-03-17 VITALS — BP 148/106 | HR 67 | Temp 97.7°F | Resp 12 | Wt 175.7 lb

## 2015-03-17 DIAGNOSIS — F411 Generalized anxiety disorder: Secondary | ICD-10-CM | POA: Diagnosis not present

## 2015-03-17 DIAGNOSIS — M542 Cervicalgia: Secondary | ICD-10-CM

## 2015-03-17 DIAGNOSIS — M5030 Other cervical disc degeneration, unspecified cervical region: Secondary | ICD-10-CM

## 2015-03-17 HISTORY — DX: Other cervical disc degeneration, unspecified cervical region: M50.30

## 2015-03-17 HISTORY — DX: Generalized anxiety disorder: F41.1

## 2015-03-17 MED ORDER — PAROXETINE HCL 10 MG PO TABS: 10.0000 mg | ORAL_TABLET | Freq: Every day | ORAL | Status: DC

## 2015-03-17 MED ORDER — ALPRAZOLAM 1 MG PO TABS: 0.5000 mg | ORAL_TABLET | Freq: Two times a day (BID) | ORAL | Status: DC | PRN

## 2015-03-17 NOTE — Progress Notes (Signed)
Name: Angelica Herrera   MRN: [id number]    DOB: 10-30-1970   Date:03/17/2015       Progress Note  Subjective  Chief Complaint  Chief Complaint  Patient presents with  . Medication Refill  . Anxiety    wnats to increase her dosage of Xanax to 1mg     HPI  Angelica Herrera is a 44 year old female here for routine follow up. Known significant history of CKD stage III, CHF, CAD, history of HSVT, HTN, HLD, Cigarette smoker, hypothyroidism. Today she is here for follow up of her anxiety and is requesting increase of Xanax from 0.5 mg to 1 mg. She reports relying on her Xanax about 2-3 times a day. Reports social anxiety and overwhelming sensation calmed down by the medication. In the past was on 1 mg but her previous provider also wanted to reduce her dose so that is why she is on 0.5 mg but she feels the 1 mg dose worked better. When asked about previous trials of SSRI or SNRI she reports previously being on Paxil with no side effects as well. Does report some low moods and feeling depressed. No suicidal thoughts or ideations.   Otherwise complains of chronic neck pain and stiffness not associated with numbness and tingling in the hands. Pain is not worsening, stable. Has had lower back issues and has consulted with specialist for her lower back but not her neck. Wants an MRI of her neck.    Past Medical History  Diagnosis Date  . HTN (hypertension)   . Hyperlipidemia   . Tobacco abuse   . Obesity   . CKD (chronic kidney disease), stage IV ([place])   . Ischemic cardiomyopathy     a. 02/2012 Echo: EF 40-45%, ant/lat HK, Gr1 DD, Mild MR, PASP 40mmHg.  Marland Kitchen NSVT (nonsustained ventricular tachycardia) ([place])     a. 02/2012 post-MI  . Hypothyroidism   . CAD (coronary artery disease)     a. 02/2012 s/p anterolateral STEMI/Cath/PCI: LM 100 (3.0x15 DES ), LAD sm, severely diseased (PTCA w/ 1.23mm balloon), LCX nl, RI moderate size, nl, RCA 30-16m, AM/PDA/PLA nl, R->L collats noted.    Patient  Active Problem List   Diagnosis Date Noted  . CAD (coronary artery disease)   . HTN (hypertension)   . Hyperlipidemia   . Obesity   . CKD (chronic kidney disease), stage IV ([place])   . Ischemic cardiomyopathy   . NSVT (nonsustained ventricular tachycardia) ([place])   . Hypothyroidism   . Acute MI anterior wall first episode care ([place]) 02/12/2012  . Essential hypertension, malignant 02/12/2012  . Tobacco abuse 02/12/2012    Social History  Substance Use Topics  . Smoking status: Former Smoker -- 1.00 packs/day for 28 years    Types: Cigarettes  . Smokeless tobacco: Not on file  . Alcohol Use: Yes     Current outpatient prescriptions:  .  ALPRAZolam (XANAX) 0.5 MG tablet, TAKE 1 TABLET BY MOUTH TWICE A DAY AS NEEDED SEVERE ANXIETY ONLY, Disp: 60 tablet, Rfl: 0 .  aspirin 81 MG tablet, Take 1 tablet (81 mg total) by mouth daily., Disp: 30 tablet, Rfl:  .  atorvastatin (LIPITOR) 80 MG tablet, Take 1 tablet (80 mg total) by mouth daily at 6 PM., Disp: 30 tablet, Rfl: 6 .  carvedilol (COREG) 25 MG tablet, TAKE 1 TABLET BY MOUTH TWICE A DAY, Disp: 60 tablet, Rfl: 0 .  carvedilol (COREG) 6.25 MG tablet, Take 1 tablet (6.25 mg total) by mouth  2 (two) times daily with a meal., Disp: 30 tablet, Rfl: 6 .  furosemide (LASIX) 20 MG tablet, Take 20 mg by mouth 2 (two) times daily., Disp: , Rfl: 5 .  gabapentin (NEURONTIN) 300 MG capsule, TAKE ONE CAPSULE BY MOUTH 3 TIMES A DAY, Disp: 90 capsule, Rfl: 0 .  levothyroxine (SYNTHROID, LEVOTHROID) 137 MCG tablet, Take 137 mcg by mouth daily., Disp: , Rfl:  .  lisinopril (PRINIVIL,ZESTRIL) 10 MG tablet, Take 10 mg by mouth daily., Disp: , Rfl:  .  nicotine (NICODERM CQ - DOSED IN MG/24 HR) 7 mg/24hr patch, Place 1 patch onto the skin daily., Disp: 28 patch, Rfl:  .  nitroGLYCERIN (NITROSTAT) 0.4 MG SL tablet, Place 1 tablet (0.4 mg total) under the tongue every 5 (five) minutes x 3 doses as needed for chest pain., Disp: 25 tablet, Rfl: 3 .  pantoprazole  (PROTONIX) 40 MG tablet, Take 1 tablet (40 mg total) by mouth daily at 12 noon., Disp: 30 tablet, Rfl: 6 .  prasugrel (EFFIENT) 10 MG TABS, Take 1 tablet (10 mg total) by mouth daily., Disp: 30 tablet, Rfl: 6  Past Surgical History  Procedure Laterality Date  . Abdominal hysterectomy    . Dilation and curettage of uterus    . Cardiac catheterization  2013    [facility] s/p stent  . Left heart catheterization with coronary angiogram N/A 02/11/2012    Procedure: LEFT HEART CATHETERIZATION WITH CORONARY ANGIOGRAM;  Surgeon: [name], MD;  Location: [facility] CATH LAB;  Service: Cardiovascular;  Laterality: N/A;    Family History  Problem Relation Age of Onset  . Hypertension Father   . Hypertension Mother   . Hyperlipidemia Mother     No Known Allergies   Review of Systems  CONSTITUTIONAL: No significant weight changes, fever, chills, weakness or fatigue.  CARDIOVASCULAR: No chest pain, chest pressure or chest discomfort. No palpitations or edema.  RESPIRATORY: No shortness of breath, cough or sputum.  NEUROLOGICAL: No headache, dizziness, syncope, paralysis, ataxia, numbness or tingling in the extremities. No memory changes. No change in bowel or bladder control.  MUSCULOSKELETAL: No joint pain. No muscle pain. HEMATOLOGIC: No anemia, bleeding or bruising.  LYMPHATICS: No enlarged lymph nodes.  PSYCHIATRIC: No change in mood. No change in sleep pattern.  ENDOCRINOLOGIC: No reports of sweating, cold or heat intolerance. No polyuria or polydipsia.   Depression screen [facility] 2/9 03/17/2015  Decreased Interest 0  Down, Depressed, Hopeless 1  PHQ - 2 Score 1     Objective  BP 148/106 mmHg  Pulse 67  Temp(Src) 97.7 F (36.5 C) (Oral)  Resp 12  Wt 175 lb 11.2 oz (79.697 kg)  SpO2 98% Body mass index is 27.51 kg/(m^2).  Physical Exam  Constitutional: Patient appears well-developed and well-nourished. In no distress.  Cardiovascular: Normal rate, regular rhythm and normal heart  sounds.  No murmur heard.  Pulmonary/Chest: Effort normal and breath sounds normal. No respiratory distress. Musculoskeletal: Normal range of motion bilateral UE and LE, no joint effusions. Cervical spine with no palpable step off, normal ROM. Negative Spurling sign.  Peripheral vascular: Bilateral LE no edema. Neurological: CN II-XII grossly intact with no focal deficits. Alert and oriented to person, place, and time. Coordination, balance, strength, speech and gait are normal.  Skin: Skin is warm and dry. No rash noted. No erythema.  Psychiatric: Patient has a stable mood and affect. Behavior is normal in office today. Judgment and thought content normal in office today.  Assessment & Plan  1. GAD (generalized anxiety disorder) Too reliant on benzodiazepines, expressed this to patient and suggested optimizing SSRI or SNRI along with increased dose of Xanax to 1 mg but less quantity (#30 dispensed today). She has been counseled that Xanax is habit forming and the ultimate goal is to rely on this medication as less at possible. Start low dose Paxil and doses will be adjusted accordingly. The patient has been counseled on the proper use, side effects and potential interactions of the new medication. Patient encouraged to review the side effects and safety profile pamphlet provided with the prescription from the pharmacy as well as request counseling from the pharmacy team as needed.   - PARoxetine (PAXIL) 10 MG tablet; Take 1 tablet (10 mg total) by mouth daily.  Dispense: 30 tablet; Refill: 3 - ALPRAZolam (XANAX) 1 MG tablet; Take 0.5-1 tablets (0.5-1 mg total) by mouth 2 (two) times daily as needed for anxiety.  Dispense: 30 tablet; Refill: 3  2. Neck pain No significant physical findings. Will start with X-ray.   - DG Cervical Spine Complete; Future

## 2015-03-17 NOTE — Telephone Encounter (Signed)
Pt was here this morning for an appt and forgot to mention that she needs a refill on Gabepentin to be sent to [place].

## 2015-03-17 NOTE — Telephone Encounter (Signed)
Refill request was sent to Dr. Ashany Sundaram for approval and submission.  

## 2015-03-20 MED ORDER — GABAPENTIN 300 MG PO CAPS: 300.0000 mg | ORAL_CAPSULE | Freq: Three times a day (TID) | ORAL | Status: DC

## 2015-04-05 ENCOUNTER — Other Ambulatory Visit: Payer: Self-pay | Admitting: Family Medicine

## 2015-04-05 NOTE — Telephone Encounter (Signed)
PLEASE CALL PHARMACY, I NEED CLARIFICATION ON MEDICATION DOSE: What was the last RX dispensed for Coreg, 6.25 mg dose or 25 mg dose???

## 2015-04-19 ENCOUNTER — Other Ambulatory Visit: Payer: Self-pay | Admitting: Family Medicine

## 2015-05-18 ENCOUNTER — Ambulatory Visit
Admission: RE | Admit: 2015-05-18 | Discharge: 2015-05-18 | Disposition: A | Discharge status: Home / Self Care | Payer: Medicaid Other | Source: Ambulatory Visit | Attending: Family Medicine | Admitting: Family Medicine

## 2015-05-18 DIAGNOSIS — M542 Cervicalgia: Secondary | ICD-10-CM | POA: Insufficient documentation

## 2015-05-19 ENCOUNTER — Other Ambulatory Visit: Payer: Self-pay | Admitting: Family Medicine

## 2015-05-22 ENCOUNTER — Other Ambulatory Visit: Payer: Self-pay | Admitting: Family Medicine

## 2015-05-22 DIAGNOSIS — M5030 Other cervical disc degeneration, unspecified cervical region: Secondary | ICD-10-CM

## 2015-06-28 ENCOUNTER — Encounter: Payer: Self-pay | Admitting: Family Medicine

## 2015-06-28 ENCOUNTER — Ambulatory Visit (INDEPENDENT_AMBULATORY_CARE_PROVIDER_SITE_OTHER): Payer: Medicaid Other | Admitting: Family Medicine

## 2015-06-28 VITALS — BP 138/84 | HR 61 | Resp 16 | Ht 67.0 in | Wt 168.0 lb

## 2015-06-28 DIAGNOSIS — J301 Allergic rhinitis due to pollen: Secondary | ICD-10-CM

## 2015-06-28 DIAGNOSIS — N184 Chronic kidney disease, stage 4 (severe): Secondary | ICD-10-CM | POA: Diagnosis not present

## 2015-06-28 DIAGNOSIS — I2510 Atherosclerotic heart disease of native coronary artery without angina pectoris: Secondary | ICD-10-CM | POA: Diagnosis not present

## 2015-06-28 DIAGNOSIS — F411 Generalized anxiety disorder: Secondary | ICD-10-CM | POA: Diagnosis not present

## 2015-06-28 DIAGNOSIS — I10 Essential (primary) hypertension: Secondary | ICD-10-CM

## 2015-06-28 HISTORY — DX: Allergic rhinitis due to pollen: J30.1

## 2015-06-28 MED ORDER — FLUTICASONE PROPIONATE 50 MCG/ACT NA SUSP: 2.0000 | Freq: Every day | NASAL | Status: DC

## 2015-06-28 MED ORDER — ALPRAZOLAM 1 MG PO TABS: 0.5000 mg | ORAL_TABLET | Freq: Every day | ORAL | Status: DC

## 2015-06-28 MED ORDER — PAROXETINE HCL 20 MG PO TABS: 20.0000 mg | ORAL_TABLET | Freq: Every day | ORAL | Status: DC

## 2015-06-28 NOTE — Progress Notes (Signed)
Name: Angelica Herrera   MRN: [id number]    DOB: 10-Dec-1970   Date:06/28/2015       Progress Note  Subjective  Chief Complaint  Chief Complaint  Patient presents with  . Medication Refill  . Hypertension    Kidney Dr. started her clonodine and told her to take 3x day but only takes 2 b/c she states drops bp to low.  . Referral    wants to f/lu with cadio for past heart attack and blood pressure  . Ear Fullness    right onset years.  Patient describes as fullness was given nasal spray in past, which does not seem to help.    HPI  Angelica Herrera is a 45 year old female here for routine follow up. Known significant history of CKD stage III, CHF, CAD, history of HSVT, HTN, HLD, Cigarette smoker, hypothyroidism. At our last visit she had asked to increase her xanax dose but I had advised against too much reliance on benzodiazepines.  She was agreeable to starting Paxil 10mg  one a day again and reducing quantity of Xanax to #30 but at increased dose of 1mg . No suicidal thoughts or ideations expressed today. Reports mild improvement of anxious symptoms but still easily agitated and irritable.   She has consulted with Nephrologist. They have added Clonidine to her regimen, supposed to be TID but she is dosing it BID as she had 2 isolated low BPs (systolic [id number] and A999333) which made her slightly light headed. Her other BPs at home have been systolic range [id number] and diastolic 0000000. Otherwise has not followed up with a cardiologist in a few years. Last saw Dr. Fletcher Anon in 2013 I believe.   Today she complains of ear fullness, chronic issue, 2 years now, right side mostly. Not associated with hearing changes, dizziness, fever, chills, headaches.  Past Medical History  Diagnosis Date  . HTN (hypertension)   . Hyperlipidemia   . Tobacco abuse   . Obesity   . CKD (chronic kidney disease), stage IV ([place])   . Ischemic cardiomyopathy     a. 02/2012 Echo: EF 40-45%, ant/lat HK, Gr1 DD, Mild MR,  PASP 62mmHg.  Marland Kitchen NSVT (nonsustained ventricular tachycardia) ([place])     a. 02/2012 post-MI  . Hypothyroidism   . CAD (coronary artery disease)     a. 02/2012 s/p anterolateral STEMI/Cath/PCI: LM 100 (3.0x15 DES ), LAD sm, severely diseased (PTCA w/ 1.86mm balloon), LCX nl, RI moderate size, nl, RCA 30-81m, AM/PDA/PLA nl, R->L collats noted.    Patient Active Problem List   Diagnosis Date Noted  . GAD (generalized anxiety disorder) 03/17/2015  . Degenerative disc disease, cervical 03/17/2015  . CAD (coronary artery disease)   . HTN (hypertension)   . Hyperlipidemia   . Obesity   . CKD (chronic kidney disease), stage IV ([place])   . Ischemic cardiomyopathy   . NSVT (nonsustained ventricular tachycardia) ([place])   . Hypothyroidism   . Acute MI anterior wall first episode care ([place]) 02/12/2012  . Essential hypertension, malignant 02/12/2012  . Tobacco abuse 02/12/2012    Social History  Substance Use Topics  . Smoking status: Former Smoker -- 1.00 packs/day for 28 years    Types: Cigarettes  . Smokeless tobacco: Not on file  . Alcohol Use: Yes     Current outpatient prescriptions:  .  ALPRAZolam (XANAX) 1 MG tablet, Take 0.5-1 tablets (0.5-1 mg total) by mouth 2 (two) times daily as needed for anxiety., Disp: 30 tablet, Rfl: 3 .  aspirin 81 MG tablet, Take 1 tablet (81 mg total) by mouth daily., Disp: 30 tablet, Rfl:  .  carvedilol (COREG) 25 MG tablet, TAKE 1 TABLET BY MOUTH TWICE A DAY, Disp: 180 tablet, Rfl: 2 .  cloNIDine (CATAPRES) 0.1 MG tablet, Take 0.1 mg by mouth 3 (three) times daily., Disp: , Rfl: 3 .  furosemide (LASIX) 20 MG tablet, TAKE 1 TABLET BY MOUTH TWICE A DAY, Disp: 60 tablet, Rfl: 5 .  gabapentin (NEURONTIN) 300 MG capsule, Take 1 capsule (300 mg total) by mouth 3 (three) times daily., Disp: 90 capsule, Rfl: 5 .  levothyroxine (SYNTHROID, LEVOTHROID) 150 MCG tablet, TAKE 1 TABLET BY MOUTH EVERY DAY, Disp: 90 tablet, Rfl: 1 .  nitroGLYCERIN (NITROSTAT) 0.4 MG SL  tablet, Place 1 tablet (0.4 mg total) under the tongue every 5 (five) minutes x 3 doses as needed for chest pain., Disp: 25 tablet, Rfl: 3 .  pantoprazole (PROTONIX) 40 MG tablet, Take 1 tablet (40 mg total) by mouth daily at 12 noon., Disp: 30 tablet, Rfl: 6 .  PARoxetine (PAXIL) 10 MG tablet, Take 1 tablet (10 mg total) by mouth daily., Disp: 30 tablet, Rfl: 3  Past Surgical History  Procedure Laterality Date  . Abdominal hysterectomy    . Dilation and curettage of uterus    . Cardiac catheterization  2013    [facility] s/p stent  . Left heart catheterization with coronary angiogram N/A 02/11/2012    Procedure: LEFT HEART CATHETERIZATION WITH CORONARY ANGIOGRAM;  Surgeon: [name], MD;  Location: [facility] CATH LAB;  Service: Cardiovascular;  Laterality: N/A;    Family History  Problem Relation Age of Onset  . Hypertension Father   . Hypertension Mother   . Hyperlipidemia Mother     No Known Allergies   Review of Systems  CONSTITUTIONAL: No significant weight changes, fever, chills, weakness or fatigue.  HEENT:  - Eyes: No visual changes.  - Ears: No auditory changes. No pain but does report a fullness sensation.  - Nose: No sneezing, congestion, runny nose. - Throat: No sore throat. No changes in swallowing. SKIN: No rash or itching.  CARDIOVASCULAR: No chest pain, chest pressure or chest discomfort. No palpitations or edema.  RESPIRATORY: No shortness of breath, cough or sputum.  GASTROINTESTINAL: No anorexia, nausea, vomiting. No changes in bowel habits. No abdominal pain or blood.  GENITOURINARY: No dysuria. No frequency. No discharge.  NEUROLOGICAL: No headache, dizziness, syncope, paralysis, ataxia, numbness or tingling in the extremities. No memory changes. No change in bowel or bladder control.  MUSCULOSKELETAL: No joint pain. No muscle pain. HEMATOLOGIC: No anemia, bleeding or bruising.  LYMPHATICS: No enlarged lymph nodes.  PSYCHIATRIC: No change in mood. No change in  sleep pattern.  ENDOCRINOLOGIC: No reports of sweating, cold or heat intolerance. No polyuria or polydipsia.     Objective  BP 138/84 mmHg  Pulse 61  Resp 16  Ht 5\' 7"  (1.702 m)  Wt 168 lb (76.204 kg)  BMI 26.31 kg/m2  SpO2 97% Body mass index is 26.31 kg/(m^2).  Physical Exam  Constitutional: Patient appears well-developed and well-nourished. In no distress.  HEENT:  - Head: Normocephalic and atraumatic.  - Ears: Bilateral TMs gray, no erythema or effusion - Nose: Nasal mucosa moist - Mouth/Throat: Oropharynx is clear and moist. No tonsillar hypertrophy or erythema. No post nasal drainage.  - Eyes: Conjunctivae clear, EOM movements normal. PERRLA. No scleral icterus.  Neck: Normal range of motion. Neck supple. No JVD present. No thyromegaly present.  Cardiovascular: Normal rate, regular rhythm and normal heart sounds.  No murmur heard.  Pulmonary/Chest: Effort normal and breath sounds normal. No respiratory distress. Musculoskeletal: Normal range of motion bilateral UE and LE, no joint effusions. Peripheral vascular: Bilateral LE no edema. Neurological: CN II-XII grossly intact with no focal deficits. Alert and oriented to person, place, and time. Coordination, balance, strength, speech and gait are normal.  Skin: Skin is warm and dry. No rash noted. No erythema.  Psychiatric: Patient has a normal mood and affect. Behavior is normal in office today. Judgment and thought content normal in office today.  Assessment & Plan  1. Coronary artery disease involving native coronary artery of native heart without angina pectoris Re establish with Cardiologist for general follow up.  - Ambulatory referral to Cardiology  2. Essential hypertension, malignant Needs to take clonidine TID as prescribed.  - Ambulatory referral to Cardiology  3. CKD (chronic kidney disease), stage IV ([place]) Has established with nephrologist.  4. GAD (generalized anxiety disorder) Increased paxil from  10mg  to 20 mg.   - PARoxetine (PAXIL) 20 MG tablet; Take 1 tablet (20 mg total) by mouth daily.  Dispense: 30 tablet; Refill: 3 - ALPRAZolam (XANAX) 1 MG tablet; Take 0.5-1 tablets (0.5-1 mg total) by mouth daily.  Dispense: 30 tablet; Refill: 3  5. Allergic rhinitis due to pollen Start anti-histamine with flonase.   - fluticasone (FLONASE) 50 MCG/ACT nasal spray; Place 2 sprays into both nostrils daily.  Dispense: 16 g; Refill: 3

## 2015-06-28 NOTE — Patient Instructions (Signed)
1) Pick up Claritin, Zyrtec or Allegra (genric okay) over the counter and take one daily

## 2015-07-18 ENCOUNTER — Encounter: Payer: Self-pay | Admitting: Cardiovascular Disease

## 2015-07-18 ENCOUNTER — Encounter (INDEPENDENT_AMBULATORY_CARE_PROVIDER_SITE_OTHER): Payer: Self-pay

## 2015-07-18 ENCOUNTER — Ambulatory Visit (INDEPENDENT_AMBULATORY_CARE_PROVIDER_SITE_OTHER): Payer: Medicaid Other | Admitting: Cardiovascular Disease

## 2015-07-18 VITALS — BP 102/70 | HR 66 | Ht 67.0 in | Wt 161.4 lb

## 2015-07-18 DIAGNOSIS — R079 Chest pain, unspecified: Secondary | ICD-10-CM | POA: Diagnosis not present

## 2015-07-18 NOTE — Progress Notes (Signed)
Cardiology Office Note   Date:  07/18/2015   ID:  Angelica Herrera, DOB 12-14-1970, MRN [id number]  PCP:  Bobetta Lime, MD  Cardiologist:   Kathlyn Sacramento, MD  Nephrologist: Dr. Rolly Salter  Chief Complaint  Patient presents with  . Follow-up    some CP &  BP dropping low      History of Present Illness: Angelica Herrera is a 45 y.o. female who presents to establish cardiovascular care regarding coronary artery disease and chronic systolic heart failure. She was last seen by me in 2013. She presented in October 2013 with acute anterolateral ST elevation myocardial infarction. She underwent emergent cardiac catheterization by Dr. Burt Knack which showed flush occlusion of left main coronary artery. There was significant difficulty in engaging the left main. Left main balloon angioplasty followed by drug-eluting stent placement was performed. She also had balloon angioplasty done to the LAD due to embolization from the left main. The patient had significant hemodynamic instability with ventricular tachycardia and hypotension.  She was noted to have severe mitral regurgitation initially. However, subsequent echocardiogram showed an ejection fraction of 45% with no significant mitral regurgitation.  The patient has not had follow-up with [place] since late 2013. She was seen by Dr. Lavera Guise. She has not had any hospitalization. She now has Medicaid and trying to get Social Security. She continues to smoke one third of a pack per day. She has not been able to afford some of her medications and she reports not being on a statin for that reason. She recently had episodes of chest pain in the setting of elevated blood pressure. She was started on clonidine by Dr. Rolly Salter but she reports significant drop in blood pressure with clonidine and thus she decreased the dose to 0.1 mg once daily. It's not clear why she is not on an ACE inhibitor or ARB. She reports improvement in chest pain since her blood  pressure has been more controlled. She has chronic exertional dyspnea.  Past Medical History  Diagnosis Date  . HTN (hypertension)   . Hyperlipidemia   . Tobacco abuse   . Obesity   . CKD (chronic kidney disease), stage IV ([place])   . Ischemic cardiomyopathy     a. 02/2012 Echo: EF 40-45%, ant/lat HK, Gr1 DD, Mild MR, PASP 15mmHg.  Marland Kitchen NSVT (nonsustained ventricular tachycardia) ([place])     a. 02/2012 post-MI  . Hypothyroidism   . CAD (coronary artery disease)     a. 02/2012 s/p anterolateral STEMI/Cath/PCI: LM 100 (3.0x15 DES ), LAD sm, severely diseased (PTCA w/ 1.38mm balloon), LCX nl, RI moderate size, nl, RCA 30-38m, AM/PDA/PLA nl, R->L collats noted.    Past Surgical History  Procedure Laterality Date  . Abdominal hysterectomy    . Dilation and curettage of uterus    . Cardiac catheterization  2013    [facility] s/p stent  . Left heart catheterization with coronary angiogram N/A 02/11/2012    Procedure: LEFT HEART CATHETERIZATION WITH CORONARY ANGIOGRAM;  Surgeon: [name], MD;  Location: [facility] CATH LAB;  Service: Cardiovascular;  Laterality: N/A;     Current Outpatient Prescriptions  Medication Sig Dispense Refill  . ALPRAZolam (XANAX) 1 MG tablet Take 0.5-1 tablets (0.5-1 mg total) by mouth daily. 30 tablet 3  . aspirin 81 MG tablet Take 1 tablet (81 mg total) by mouth daily. 30 tablet   . carvedilol (COREG) 25 MG tablet TAKE 1 TABLET BY MOUTH TWICE A DAY 180 tablet 2  . cloNIDine (CATAPRES)  0.1 MG tablet Take 0.1 mg by mouth 3 (three) times daily.  3  . fluticasone (FLONASE) 50 MCG/ACT nasal spray Place 2 sprays into both nostrils daily. 16 g 3  . furosemide (LASIX) 20 MG tablet TAKE 1 TABLET BY MOUTH TWICE A DAY 60 tablet 5  . gabapentin (NEURONTIN) 300 MG capsule Take 1 capsule (300 mg total) by mouth 3 (three) times daily. 90 capsule 5  . levothyroxine (SYNTHROID, LEVOTHROID) 150 MCG tablet TAKE 1 TABLET BY MOUTH EVERY DAY 90 tablet 1  . nitroGLYCERIN (NITROSTAT) 0.4 MG SL  tablet Place 1 tablet (0.4 mg total) under the tongue every 5 (five) minutes x 3 doses as needed for chest pain. 25 tablet 3  . pantoprazole (PROTONIX) 40 MG tablet Take 1 tablet (40 mg total) by mouth daily at 12 noon. 30 tablet 6  . PARoxetine (PAXIL) 20 MG tablet Take 1 tablet (20 mg total) by mouth daily. 30 tablet 3   No current facility-administered medications for this visit.    Allergies:   Review of patient's allergies indicates no known allergies.    Social History:  The patient  reports that she has quit smoking. Her smoking use included Cigarettes. She has a 28 pack-year smoking history. She does not have any smokeless tobacco history on file. She reports that she drinks alcohol. She reports that she uses illicit drugs (Marijuana).   Family History:  The patient's family history includes Hyperlipidemia in her mother; Hypertension in her father and mother.    ROS:  Please see the history of present illness.   Otherwise, review of systems are positive for none.   All other systems are reviewed and negative.    PHYSICAL EXAM: VS:  There were no vitals taken for this visit. , BMI There is no weight on file to calculate BMI. GEN: Well nourished, well developed, in no acute distress HEENT: normal Neck: no JVD, carotid bruits, or masses Cardiac: RRR; no murmurs, rubs, or gallops,no edema  Respiratory:  clear to auscultation bilaterally, normal work of breathing GI: soft, nontender, nondistended, + BS MS: no deformity or atrophy Skin: warm and dry, no rash Neuro:  Strength and sensation are intact Psych: euthymic mood, full affect   EKG:  EKG is ordered today. The ekg ordered today demonstrates normal sinus rhythm with incomplete right bundle branch block and left anterior fascicular block.   Recent Labs: 08/11/2014: BUN 17; Creatinine 1.24*; HGB 15.4; Platelet 232; Potassium 3.7; Sodium 135    Lipid Panel    Component Value Date/Time   CHOL 129 02/13/2012 0525   TRIG  117 02/13/2012 0525   HDL 33* 02/13/2012 0525   CHOLHDL 3.9 02/13/2012 0525   VLDL 23 02/13/2012 0525   LDLCALC 73 02/13/2012 0525      Wt Readings from Last 3 Encounters:  06/28/15 168 lb (76.204 kg)  03/17/15 175 lb 11.2 oz (79.697 kg)  02/21/12 178 lb 4 oz (80.854 kg)         ASSESSMENT AND PLAN:  1.  Coronary artery disease involving native coronary arteries with other forms of angina: The patient has known history of coronary artery disease with previous drug-eluting stent placement to the left main coronary artery and balloon angioplasty of the LAD. She had recent chest pain in the setting of uncontrolled hypertension. I requested further evaluation with a treadmill nuclear stress test. I will have a low threshold for cardiac catheterization.  2. Chronic systolic heart failure: Due to ischemic cardiomyopathy with most  recent ejection fraction of 45%. Ejection fraction will be evaluated with nuclear stress test. If EF is low, we should consider treatment with an ACE inhibitor or ARB if there is no contraindication. Continue current dose of carvedilol.  3. Essential hypertension: Blood pressure is now controlled on carvedilol and small dose clonidine. However, an ACE inhibitor or ARB should be considered as outlined above.  4. Hyperlipidemia: She reports inability to afford treatment with statins as she does not have the $3 co-pay. The plan is to resume atorvastatin in the near future.  5. Tobacco use: I discussed with her the importance of cessation. She reports being under significant stress.  6. Chronic kidney disease: Followed by nephrology.    Disposition:   FU with me in 2 months  Signed,  Kathlyn Sacramento, MD  07/18/2015 2:03 PM    [place]

## 2015-07-18 NOTE — Patient Instructions (Addendum)
Medication Instructions:  Your physician recommends that you continue on your current medications as directed. Please refer to the Current Medication list given to you today.   Labwork: none  Testing/Procedures: Your physician has requested that you have a lexiscan myoview. For further information please visit [email or web address]. Please follow instruction sheet, as given.  [place]  Your caregiver has ordered a Stress Test with nuclear imaging. The purpose of this test is to evaluate the blood supply to your heart muscle. This procedure is referred to as a "Non-Invasive Stress Test." This is because other than having an IV started in your vein, nothing is inserted or "invades" your body. Cardiac stress tests are done to find areas of poor blood flow to the heart by determining the extent of coronary artery disease (CAD). Some patients exercise on a treadmill, which naturally increases the blood flow to your heart, while others who are  unable to walk on a treadmill due to physical limitations have a pharmacologic/chemical stress agent called Lexiscan . This medicine will mimic walking on a treadmill by temporarily increasing your coronary blood flow.   Please note: these test may take anywhere between 2-4 hours to complete  PLEASE REPORT TO [place] AT THE FIRST DESK WILL DIRECT YOU WHERE TO GO  Date of Procedure:__Tuesday, April 11____  Arrival Time for Procedure:____8:15am ___  Instructions regarding medication:     __xx__:  Hold coreg the morning of procedure    PLEASE NOTIFY THE OFFICE AT LEAST 24 HOURS IN ADVANCE IF YOU ARE UNABLE TO KEEP YOUR APPOINTMENT.  [phone] AND  PLEASE NOTIFY NUCLEAR MEDICINE AT [facility] AT LEAST 24 HOURS IN ADVANCE IF YOU ARE UNABLE TO KEEP YOUR APPOINTMENT. [phone]  How to prepare for your Myoview test:   Do not eat or drink after midnight  No caffeine for 24 hours prior to test  No smoking 24 hours  prior to test.  Your medication may be taken with water.  If your doctor stopped a medication because of this test, do not take that medication.  Ladies, please do not wear dresses.  Skirts or pants are appropriate. Please wear a short sleeve shirt.  No perfume, cologne or lotion.  Wear comfortable walking shoes. No heels!            Follow-Up: Your physician recommends that you schedule a follow-up appointment in: two months with Dr. Fletcher Anon.    Any Other Special Instructions Will Be Listed Below (If Applicable).     If you need a refill on your cardiac medications before your next appointment, please call your pharmacy.  Cardiac Nuclear Scanning A cardiac nuclear scan is used to check your heart for problems, such as the following:  A portion of the heart is not getting enough blood.  Part of the heart muscle has died, which happens with a heart attack.  The heart wall is not working normally.  In this test, a radioactive dye (tracer) is injected into your bloodstream. After the tracer has traveled to your heart, a scanning device is used to measure how much of the tracer is absorbed by or distributed to various areas of your heart. LET [facility] CARE PROVIDER KNOW ABOUT:  Any allergies you have.  All medicines you are taking, including vitamins, herbs, eye drops, creams, and over-the-counter medicines.  Previous problems you or members of your family have had with the use of anesthetics.  Any blood disorders you have.  Previous surgeries you  have had.  Medical conditions you have.  RISKS AND COMPLICATIONS Generally, this is a safe procedure. However, as with any procedure, problems can occur. Possible problems include:   Serious chest pain.  Rapid heartbeat.  Sensation of warmth in your chest. This usually passes quickly. BEFORE THE PROCEDURE Ask your health care provider about changing or stopping your regular medicines. PROCEDURE This procedure is  usually done at a hospital and takes 2-4 hours.  An IV tube is inserted into one of your veins.  Your health care provider will inject a small amount of radioactive tracer through the tube.  You will then wait for 20-40 minutes while the tracer travels through your bloodstream.  You will lie down on an exam table so images of your heart can be taken. Images will be taken for about 15-20 minutes.  You will exercise on a treadmill or stationary bike. While you exercise, your heart activity will be monitored with an electrocardiogram (ECG), and your blood pressure will be checked.  If you are unable to exercise, you may be given a medicine to make your heart beat faster.  When blood flow to your heart has peaked, tracer will again be injected through the IV tube.  After 20-40 minutes, you will get back on the exam table and have more images taken of your heart.  When the procedure is over, your IV tube will be removed. AFTER THE PROCEDURE  You will likely be able to leave shortly after the test. Unless your health care provider tells you otherwise, you may return to your normal schedule, including diet, activities, and medicines.  Make sure you find out how and when you will get your test results.   This information is not intended to replace advice given to you by your health care provider. Make sure you discuss any questions you have with your health care provider.   Document Released: 04/26/2004 Document Revised: 04/06/2013 Document Reviewed: 03/10/2013 Elsevier Interactive Patient Education [employer].

## 2015-07-20 ENCOUNTER — Other Ambulatory Visit: Payer: Self-pay

## 2015-07-20 NOTE — Telephone Encounter (Signed)
I may be missing it, but I'm not seeing anything in her history or problem list to explain why she's taking a PPI Please call patient; find out why she takes this; any hx of ulcer; does she see GI doctor; I need more info please to decide if Rx appropriate; this drug has risks Thank you

## 2015-07-24 ENCOUNTER — Telehealth: Payer: Self-pay | Admitting: Cardiovascular Disease

## 2015-07-24 NOTE — Telephone Encounter (Signed)
Left voicemail to notify us

## 2015-07-24 NOTE — Telephone Encounter (Signed)
Reviewed lexi myoview instructions w/pt who verbalized understanding.  Confirmed date and time of arrival. Pt agreeable w/plan

## 2015-07-25 ENCOUNTER — Encounter: Admission: RE | Admit: 2015-07-25 | Payer: Medicaid Other | Source: Ambulatory Visit

## 2015-07-25 ENCOUNTER — Telehealth: Payer: Self-pay | Admitting: Cardiovascular Disease

## 2015-07-25 NOTE — Telephone Encounter (Signed)
Manuela Schwartz from Nuclear department at [facility]  Just calling to let us know pt did not show for their test this morning.

## 2015-07-25 NOTE — Telephone Encounter (Signed)
S/w pt who states her car would not start this morning and she had no other transportation. She did not call nuclear med to cancel. States she would like to reschedule for this week and will arrange for a ride if her car has not been repaired. Re-scheduled Friday, April 14, 8:30am

## 2015-07-27 ENCOUNTER — Telehealth: Payer: Self-pay | Admitting: Cardiovascular Disease

## 2015-07-27 NOTE — Telephone Encounter (Signed)
Confirmed treadmill myoview w/pt who states she will be at the appointment. Reviewed instructions. Pt verbalized understanding with no questions.

## 2015-07-28 ENCOUNTER — Encounter
Admission: RE | Admit: 2015-07-28 | Discharge: 2015-07-28 | Disposition: A | Discharge status: Home / Self Care | Payer: Medicaid Other | Source: Ambulatory Visit | Attending: Cardiovascular Disease | Admitting: Cardiovascular Disease

## 2015-07-28 ENCOUNTER — Encounter: Payer: Self-pay | Admitting: Cardiovascular Disease

## 2015-07-28 ENCOUNTER — Other Ambulatory Visit: Payer: Self-pay

## 2015-07-28 DIAGNOSIS — R079 Chest pain, unspecified: Secondary | ICD-10-CM | POA: Insufficient documentation

## 2015-07-28 DIAGNOSIS — Z01812 Encounter for preprocedural laboratory examination: Secondary | ICD-10-CM

## 2015-07-28 LAB — NM MYOCAR MULTI W/SPECT W/WALL MOTION / EF
CHL CUP MPHR: 715 {beats}/min
CHL CUP NUCLEAR SDS: 10
CHL CUP NUCLEAR SRS: 1
CHL CUP RESTING HR STRESS: 58 {beats}/min
CSEPED: 5 min
CSEPEDS: 18 s
CSEPEW: 7 METS
LV sys vol: 40 mL
LVDIAVOL: 86 mL (ref 46–106)
NUC STRESS TID: 2.05
Peak HR: 107 {beats}/min
Percent HR: 61 %
SSS: 11

## 2015-07-28 MED ORDER — TECHNETIUM TC 99M SESTAMIBI - CARDIOLITE
12.0300 | Freq: Once | INTRAVENOUS | Status: AC | PRN
  Administered 2015-07-28: 12.03 via INTRAVENOUS

## 2015-07-28 MED ORDER — TECHNETIUM TC 99M SESTAMIBI - CARDIOLITE
31.9800 | Freq: Once | INTRAVENOUS | Status: AC | PRN
  Administered 2015-07-28: 31.98 via INTRAVENOUS

## 2015-07-28 MED ORDER — REGADENOSON 0.4 MG/5ML IV SOLN
0.4000 mg | Freq: Once | INTRAVENOUS | Status: AC
  Administered 2015-07-28: 0.4 mg via INTRAVENOUS
  Filled 2015-07-28: qty 5

## 2015-07-28 NOTE — Patient Instructions (Signed)
Your physician has requested that you have a cardiac catheterization. Cardiac catheterization is used to diagnose and/or treat various heart conditions. Doctors may recommend this procedure for a number of different reasons. The most common reason is to evaluate chest pain. Chest pain can be a symptom of coronary artery disease (CAD), and cardiac catheterization can show whether plaque is narrowing or blocking your heart's arteries. This procedure is also used to evaluate the valves, as well as measure the blood flow and oxygen levels in different parts of your heart. For further information please visit [email or web address]. Please follow instruction sheet, as given.  [facility] Cardiac Cath Instructions   You are scheduled for a Cardiac Cath on:_________________________  Please arrive at _______am on the day of your procedure  You will need to pre-register prior to the day of your procedure.  Enter through the [employer] at [facility].  Registration is the first desk on your right.  Please take the procedure order we have given you in order to be registered appropriately  Do not eat/drink anything after midnight  Someone will need to drive you home  It is recommended someone be with you for the first 24 hours after your procedure  Wear clothes that are easy to get on/off and wear slip on shoes if possible   Medications bring a current list of all medications with you  __xx_ You may take all of your medications the morning of your procedure with enough water to swallow safely     Day of your procedure: Arrive at the [facility] entrance.  Free valet service is available.  After entering the [facility] please check-in at the registration desk (1st desk on your right) to receive your armband. After receiving your armband someone will escort you to the cardiac cath/special procedures waiting area.  The usual length of stay after your procedure is about 2 to 3 hours.  This can vary.  If you have  any questions, please call our office at [phone], or you may call the cardiac cath lab at [facility] directly at [phone] Angiogram An angiogram, also called angiography, is a procedure used to look at the blood vessels. In this procedure, dye is injected through a long, thin tube (catheter) into an artery. X-rays are then taken. The X-rays will show if there is a blockage or problem in a blood vessel.  LET [facility] CARE PROVIDER KNOW ABOUT:  Any allergies you have, including allergies to shellfish or contrast dye.   All medicines you are taking, including vitamins, herbs, eye drops, creams, and over-the-counter medicines.   Previous problems you or members of your family have had with the use of anesthetics.   Any blood disorders you have.   Previous surgeries you have had.  Any previous kidney problems or failure you have had.  Medical conditions you have.   Possibility of pregnancy, if this applies. RISKS AND COMPLICATIONS Generally, an angiogram is a safe procedure. However, as with any procedure, problems can occur. Possible problems include:  Injury to the blood vessels, including rupture or bleeding.  Infection or bruising at the catheter site.  Allergic reaction to the dye or contrast used.  Kidney damage from the dye or contrast used.  Blood clots that can lead to a stroke or heart attack. BEFORE THE PROCEDURE  Do not eat or drink after midnight on the night before the procedure, or as directed by your health care provider.   Ask your health care provider if you may drink  enough water to take any needed medicines the morning of the procedure.  PROCEDURE  You may be given a medicine to help you relax (sedative) before and during the procedure. This medicine is given through an IV access tube that is inserted into one of your veins.   The area where the catheter will be inserted will be washed and shaved. This is usually done in the groin but may be done  in the fold of your arm (near your elbow) or in the wrist.  A medicine will be given to numb the area where the catheter will be inserted (local anesthetic).  The catheter will be inserted with a guide wire into an artery. The catheter is guided by using a type of X-ray (fluoroscopy) to the blood vessel being examined.   Dye is then injected into the catheter, and X-rays are taken. The dye helps to show where any narrowing or blockages are located.  AFTER THE PROCEDURE   If the procedure is done through the leg, you will be kept in bed lying flat for several hours. You will be instructed to not bend or cross your legs.  The insertion site will be checked frequently.  The pulse in your feet or wrist will be checked frequently.  Additional blood tests, X-rays, and electrocardiography may be done.   You may need to stay in the hospital overnight for observation.    This information is not intended to replace advice given to you by your health care provider. Make sure you discuss any questions you have with your health care provider.   Document Released: 01/09/2005 Document Revised: 04/22/2014 Document Reviewed: 09/02/2012 Elsevier Interactive Patient Education 2016 [place] After Refer to this sheet in the next few weeks. These instructions provide you with information about caring for yourself after your procedure. Your health care provider may also give you more specific instructions. Your treatment has been planned according to current medical practices, but problems sometimes occur. Call your health care provider if you have any problems or questions after your procedure. WHAT TO EXPECT AFTER THE PROCEDURE After your procedure, it is typical to have the following:  Bruising at the catheter insertion site that usually fades within 1-2 weeks.  Blood collecting in the tissue (hematoma) that may be painful to the touch. It should usually decrease in size and  tenderness within 1-2 weeks. HOME CARE INSTRUCTIONS  Take medicines only as directed by your health care provider.  You may shower 24-48 hours after the procedure or as directed by your health care provider. Remove the bandage (dressing) and gently wash the site with plain soap and water. Pat the area dry with a clean towel. Do not rub the site, because this may cause bleeding.  Do not take baths, swim, or use a hot tub until your health care provider approves.  Check your insertion site every day for redness, swelling, or drainage.  Do not apply powder or lotion to the site.  Do not lift over 10 lb (4.5 kg) for 5 days after your procedure or as directed by your health care provider.  Ask your health care provider when it is okay to:  Return to work or school.  Resume usual physical activities or sports.  Resume sexual activity.  Do not drive home if you are discharged the same day as the procedure. Have someone else drive you.  You may drive 24 hours after the procedure unless otherwise instructed by your health care provider.  Do not operate machinery or power tools for 24 hours after the procedure or as directed by your health care provider.  If your procedure was done as an outpatient procedure, which means that you went home the same day as your procedure, a responsible adult should be with you for the first 24 hours after you arrive home.  Keep all follow-up visits as directed by your health care provider. This is important. SEEK MEDICAL CARE IF:  You have a fever.  You have chills.  You have increased bleeding from the catheter insertion site. Hold pressure on the site. SEEK IMMEDIATE MEDICAL CARE IF:  You have unusual pain at the catheter insertion site.  You have redness, warmth, or swelling at the catheter insertion site.  You have drainage (other than a small amount of blood on the dressing) from the catheter insertion site.  The catheter insertion site is  bleeding, and the bleeding does not stop after 30 minutes of holding steady pressure on the site.  The area near or just beyond the catheter insertion site becomes pale, cool, tingly, or numb.   This information is not intended to replace advice given to you by your health care provider. Make sure you discuss any questions you have with your health care provider.   Document Released: 10/18/2004 Document Revised: 04/22/2014 Document Reviewed: 09/02/2012 Elsevier Interactive Patient Education [employer].

## 2015-07-31 ENCOUNTER — Telehealth: Payer: Self-pay | Admitting: Cardiovascular Disease

## 2015-07-31 NOTE — Telephone Encounter (Signed)
Scheduled left heart cath for Thursday, April 20, 8:30am, arrival 7:30am. S/w pt who is agreeable w/plan. Reviewed instructions. Pt aware she needs labs and CXR no later thatn April 18. She will stop by our office after labs/CXR to pick up cath instructions. Pt verbalized understanding with no further questions.

## 2015-08-01 ENCOUNTER — Ambulatory Visit
Admission: RE | Admit: 2015-08-01 | Discharge: 2015-08-01 | Disposition: A | Discharge status: Home / Self Care | Payer: Medicaid Other | Source: Ambulatory Visit | Attending: Cardiovascular Disease | Admitting: Cardiovascular Disease

## 2015-08-01 ENCOUNTER — Ambulatory Visit
Admission: RE | Admit: 2015-08-01 | Discharge: 2015-08-01 | Disposition: A | Discharge status: Home / Self Care | Payer: Medicaid Other | Source: Intra-hospital | Attending: Cardiovascular Disease | Admitting: Cardiovascular Disease

## 2015-08-01 ENCOUNTER — Other Ambulatory Visit
Admission: RE | Admit: 2015-08-01 | Discharge: 2015-08-01 | Disposition: A | Discharge status: Home / Self Care | Payer: Medicaid Other | Source: Ambulatory Visit | Attending: Cardiovascular Disease | Admitting: Cardiovascular Disease

## 2015-08-01 DIAGNOSIS — Z01812 Encounter for preprocedural laboratory examination: Secondary | ICD-10-CM | POA: Insufficient documentation

## 2015-08-01 DIAGNOSIS — J449 Chronic obstructive pulmonary disease, unspecified: Secondary | ICD-10-CM | POA: Insufficient documentation

## 2015-08-01 DIAGNOSIS — I517 Cardiomegaly: Secondary | ICD-10-CM | POA: Insufficient documentation

## 2015-08-01 LAB — BASIC METABOLIC PANEL
Anion gap: 10 (ref 5–15)
BUN: 24 mg/dL — AB (ref 6–20)
CALCIUM: 10.1 mg/dL (ref 8.9–10.3)
CO2: 23 mmol/L (ref 22–32)
CREATININE: 1.25 mg/dL — AB (ref 0.44–1.00)
Chloride: 106 mmol/L (ref 101–111)
GFR calc non Af Amer: 51 mL/min — ABNORMAL LOW (ref >60)
GFR, EST AFRICAN AMERICAN: 59 mL/min — AB (ref >60)
GLUCOSE: 123 mg/dL — AB (ref 65–99)
Potassium: 3.4 mmol/L — ABNORMAL LOW (ref 3.5–5.1)
Sodium: 139 mmol/L (ref 135–145)

## 2015-08-01 LAB — CBC
HCT: 50.8 % — ABNORMAL HIGH (ref 35.0–47.0)
Hemoglobin: 16.9 g/dL — ABNORMAL HIGH (ref 12.0–16.0)
MCH: 29.5 pg (ref 26.0–34.0)
MCHC: 33.3 g/dL (ref 32.0–36.0)
MCV: 88.4 fL (ref 80.0–100.0)
PLATELETS: 175 10*3/uL (ref 150–440)
RBC: 5.74 MIL/uL — ABNORMAL HIGH (ref 3.80–5.20)
RDW: 15 % — ABNORMAL HIGH (ref 11.5–14.5)
WBC: 7.3 10*3/uL (ref 3.6–11.0)

## 2015-08-01 LAB — PROTIME-INR
INR: 1.06
PROTHROMBIN TIME: 14 s (ref 11.4–15.0)

## 2015-08-03 ENCOUNTER — Encounter
Admission: RE | Disposition: A | Discharge status: Home / Self Care | Payer: Self-pay | Source: Ambulatory Visit | Attending: Cardiovascular Disease

## 2015-08-03 ENCOUNTER — Ambulatory Visit
Admission: RE | Admit: 2015-08-03 | Discharge: 2015-08-03 | Disposition: A | Discharge status: Home / Self Care | Payer: Medicaid Other | Source: Ambulatory Visit | Attending: Cardiovascular Disease | Admitting: Cardiovascular Disease

## 2015-08-03 ENCOUNTER — Encounter: Payer: Self-pay | Admitting: *Deleted

## 2015-08-03 DIAGNOSIS — Z7982 Long term (current) use of aspirin: Secondary | ICD-10-CM | POA: Diagnosis not present

## 2015-08-03 DIAGNOSIS — Z90710 Acquired absence of both cervix and uterus: Secondary | ICD-10-CM | POA: Insufficient documentation

## 2015-08-03 DIAGNOSIS — I2510 Atherosclerotic heart disease of native coronary artery without angina pectoris: Secondary | ICD-10-CM | POA: Insufficient documentation

## 2015-08-03 DIAGNOSIS — Z87891 Personal history of nicotine dependence: Secondary | ICD-10-CM | POA: Diagnosis not present

## 2015-08-03 DIAGNOSIS — F1290 Cannabis use, unspecified, uncomplicated: Secondary | ICD-10-CM | POA: Diagnosis not present

## 2015-08-03 DIAGNOSIS — I472 Ventricular tachycardia: Secondary | ICD-10-CM | POA: Diagnosis not present

## 2015-08-03 DIAGNOSIS — I255 Ischemic cardiomyopathy: Secondary | ICD-10-CM | POA: Insufficient documentation

## 2015-08-03 DIAGNOSIS — N184 Chronic kidney disease, stage 4 (severe): Secondary | ICD-10-CM | POA: Diagnosis not present

## 2015-08-03 DIAGNOSIS — I252 Old myocardial infarction: Secondary | ICD-10-CM | POA: Diagnosis not present

## 2015-08-03 DIAGNOSIS — I130 Hypertensive heart and chronic kidney disease with heart failure and stage 1 through stage 4 chronic kidney disease, or unspecified chronic kidney disease: Secondary | ICD-10-CM | POA: Diagnosis not present

## 2015-08-03 DIAGNOSIS — Z6825 Body mass index (BMI) 25.0-25.9, adult: Secondary | ICD-10-CM | POA: Insufficient documentation

## 2015-08-03 DIAGNOSIS — Z79899 Other long term (current) drug therapy: Secondary | ICD-10-CM | POA: Diagnosis not present

## 2015-08-03 DIAGNOSIS — I739 Peripheral vascular disease, unspecified: Secondary | ICD-10-CM | POA: Diagnosis not present

## 2015-08-03 DIAGNOSIS — Z955 Presence of coronary angioplasty implant and graft: Secondary | ICD-10-CM | POA: Insufficient documentation

## 2015-08-03 DIAGNOSIS — I25118 Atherosclerotic heart disease of native coronary artery with other forms of angina pectoris: Secondary | ICD-10-CM | POA: Diagnosis not present

## 2015-08-03 DIAGNOSIS — Z8249 Family history of ischemic heart disease and other diseases of the circulatory system: Secondary | ICD-10-CM | POA: Insufficient documentation

## 2015-08-03 DIAGNOSIS — E785 Hyperlipidemia, unspecified: Secondary | ICD-10-CM | POA: Insufficient documentation

## 2015-08-03 DIAGNOSIS — I5022 Chronic systolic (congestive) heart failure: Secondary | ICD-10-CM | POA: Diagnosis not present

## 2015-08-03 DIAGNOSIS — R079 Chest pain, unspecified: Secondary | ICD-10-CM | POA: Diagnosis present

## 2015-08-03 DIAGNOSIS — E669 Obesity, unspecified: Secondary | ICD-10-CM | POA: Diagnosis not present

## 2015-08-03 DIAGNOSIS — E039 Hypothyroidism, unspecified: Secondary | ICD-10-CM | POA: Insufficient documentation

## 2015-08-03 HISTORY — PX: CARDIAC CATHETERIZATION: SHX172

## 2015-08-03 SURGERY — LEFT HEART CATH AND CORONARY ANGIOGRAPHY
Anesthesia: Moderate Sedation | Laterality: Left

## 2015-08-03 MED ORDER — SODIUM CHLORIDE 0.9 % IV SOLN
INTRAVENOUS | Status: DC
  Administered 2015-08-03: 08:00:00 via INTRAVENOUS

## 2015-08-03 MED ORDER — ASPIRIN 81 MG PO CHEW
CHEWABLE_TABLET | ORAL | Status: AC
  Administered 2015-08-03: 08:00:00
  Filled 2015-08-03: qty 1

## 2015-08-03 MED ORDER — FENTANYL CITRATE (PF) 100 MCG/2ML IJ SOLN
INTRAMUSCULAR | Status: DC | PRN
  Administered 2015-08-03: 50 ug via INTRAVENOUS
  Administered 2015-08-03 (×2): 25 ug via INTRAVENOUS

## 2015-08-03 MED ORDER — VERAPAMIL HCL 2.5 MG/ML IV SOLN
INTRAVENOUS | Status: DC | PRN
  Administered 2015-08-03: 2.5 mg via INTRA_ARTERIAL

## 2015-08-03 MED ORDER — SODIUM CHLORIDE 0.9 % IV SOLN: 250.0000 mL | INTRAVENOUS | Status: DC | PRN

## 2015-08-03 MED ORDER — MIDAZOLAM HCL 2 MG/2ML IJ SOLN
INTRAMUSCULAR | Status: DC | PRN
  Administered 2015-08-03 (×2): 1 mg via INTRAVENOUS

## 2015-08-03 MED ORDER — VERAPAMIL HCL 2.5 MG/ML IV SOLN
INTRAVENOUS | Status: AC
  Filled 2015-08-03: qty 2

## 2015-08-03 MED ORDER — SODIUM CHLORIDE 0.9% FLUSH: 3.0000 mL | INTRAVENOUS | Status: DC | PRN

## 2015-08-03 MED ORDER — HEPARIN SODIUM (PORCINE) 1000 UNIT/ML IJ SOLN
INTRAMUSCULAR | Status: DC | PRN
  Administered 2015-08-03: 3500 [IU] via INTRAVENOUS

## 2015-08-03 MED ORDER — ASPIRIN 81 MG PO CHEW
81.0000 mg | CHEWABLE_TABLET | ORAL | Status: DC
Start: 2015-08-04 — End: 2015-08-03

## 2015-08-03 MED ORDER — IOPAMIDOL (ISOVUE-300) INJECTION 61%
INTRAVENOUS | Status: DC | PRN
  Administered 2015-08-03: 45 mL via INTRA_ARTERIAL

## 2015-08-03 MED ORDER — SODIUM CHLORIDE 0.9% FLUSH: 3.0000 mL | Freq: Two times a day (BID) | INTRAVENOUS | Status: DC

## 2015-08-03 MED ORDER — HEPARIN (PORCINE) IN NACL 2-0.9 UNIT/ML-% IJ SOLN
INTRAMUSCULAR | Status: AC
  Filled 2015-08-03: qty 500

## 2015-08-03 MED ORDER — FENTANYL CITRATE (PF) 100 MCG/2ML IJ SOLN
INTRAMUSCULAR | Status: AC
  Filled 2015-08-03: qty 2

## 2015-08-03 MED ORDER — MIDAZOLAM HCL 2 MG/2ML IJ SOLN
INTRAMUSCULAR | Status: AC
  Filled 2015-08-03: qty 2

## 2015-08-03 MED ORDER — HEPARIN SODIUM (PORCINE) 1000 UNIT/ML IJ SOLN
INTRAMUSCULAR | Status: AC
  Filled 2015-08-03: qty 1

## 2015-08-03 SURGICAL SUPPLY — 7 items
CATH OPTITORQUE JACKY 4.0 5F (CATHETERS) ×3 IMPLANT
DEVICE RAD TR BAND REGULAR (VASCULAR PRODUCTS) ×3 IMPLANT
GLIDESHEATH SLEND SS 6F .021 (SHEATH) ×3 IMPLANT
KIT MANI 3VAL PERCEP (MISCELLANEOUS) ×3 IMPLANT
PACK CARDIAC CATH (CUSTOM PROCEDURE TRAY) ×3 IMPLANT
WIRE HITORQ VERSACORE ST 145CM (WIRE) ×3 IMPLANT
WIRE SAFE-T 1.5MM-J .035X260CM (WIRE) ×3 IMPLANT

## 2015-08-03 NOTE — H&P (View-Only) (Signed)
Cardiology Office Note   Date:  07/18/2015   ID:  Angelica Herrera, DOB 26-Jun-1970, MRN [id number]  PCP:  Bobetta Lime, MD  Cardiologist:   Kathlyn Sacramento, MD  Nephrologist: Dr. Rolly Salter  Chief Complaint  Patient presents with  . Follow-up    some CP &  BP dropping low      History of Present Illness: Angelica Herrera is a 45 y.o. female who presents to establish cardiovascular care regarding coronary artery disease and chronic systolic heart failure. She was last seen by me in 2013. She presented in October 2013 with acute anterolateral ST elevation myocardial infarction. She underwent emergent cardiac catheterization by Dr. Burt Knack which showed flush occlusion of left main coronary artery. There was significant difficulty in engaging the left main. Left main balloon angioplasty followed by drug-eluting stent placement was performed. She also had balloon angioplasty done to the LAD due to embolization from the left main. The patient had significant hemodynamic instability with ventricular tachycardia and hypotension.  She was noted to have severe mitral regurgitation initially. However, subsequent echocardiogram showed an ejection fraction of 45% with no significant mitral regurgitation.  The patient has not had follow-up with [place] since late 2013. She was seen by Dr. Lavera Guise. She has not had any hospitalization. She now has Medicaid and trying to get Social Security. She continues to smoke one third of a pack per day. She has not been able to afford some of her medications and she reports not being on a statin for that reason. She recently had episodes of chest pain in the setting of elevated blood pressure. She was started on clonidine by Dr. Rolly Salter but she reports significant drop in blood pressure with clonidine and thus she decreased the dose to 0.1 mg once daily. It's not clear why she is not on an ACE inhibitor or ARB. She reports improvement in chest pain since her blood  pressure has been more controlled. She has chronic exertional dyspnea.  Past Medical History  Diagnosis Date  . HTN (hypertension)   . Hyperlipidemia   . Tobacco abuse   . Obesity   . CKD (chronic kidney disease), stage IV ([place])   . Ischemic cardiomyopathy     a. 02/2012 Echo: EF 40-45%, ant/lat HK, Gr1 DD, Mild MR, PASP 71mmHg.  Marland Kitchen NSVT (nonsustained ventricular tachycardia) ([place])     a. 02/2012 post-MI  . Hypothyroidism   . CAD (coronary artery disease)     a. 02/2012 s/p anterolateral STEMI/Cath/PCI: LM 100 (3.0x15 DES ), LAD sm, severely diseased (PTCA w/ 1.50mm balloon), LCX nl, RI moderate size, nl, RCA 30-73m, AM/PDA/PLA nl, R->L collats noted.    Past Surgical History  Procedure Laterality Date  . Abdominal hysterectomy    . Dilation and curettage of uterus    . Cardiac catheterization  2013    [facility] s/p stent  . Left heart catheterization with coronary angiogram N/A 02/11/2012    Procedure: LEFT HEART CATHETERIZATION WITH CORONARY ANGIOGRAM;  Surgeon: [name], MD;  Location: [facility] CATH LAB;  Service: Cardiovascular;  Laterality: N/A;     Current Outpatient Prescriptions  Medication Sig Dispense Refill  . ALPRAZolam (XANAX) 1 MG tablet Take 0.5-1 tablets (0.5-1 mg total) by mouth daily. 30 tablet 3  . aspirin 81 MG tablet Take 1 tablet (81 mg total) by mouth daily. 30 tablet   . carvedilol (COREG) 25 MG tablet TAKE 1 TABLET BY MOUTH TWICE A DAY 180 tablet 2  . cloNIDine (CATAPRES)  0.1 MG tablet Take 0.1 mg by mouth 3 (three) times daily.  3  . fluticasone (FLONASE) 50 MCG/ACT nasal spray Place 2 sprays into both nostrils daily. 16 g 3  . furosemide (LASIX) 20 MG tablet TAKE 1 TABLET BY MOUTH TWICE A DAY 60 tablet 5  . gabapentin (NEURONTIN) 300 MG capsule Take 1 capsule (300 mg total) by mouth 3 (three) times daily. 90 capsule 5  . levothyroxine (SYNTHROID, LEVOTHROID) 150 MCG tablet TAKE 1 TABLET BY MOUTH EVERY DAY 90 tablet 1  . nitroGLYCERIN (NITROSTAT) 0.4 MG SL  tablet Place 1 tablet (0.4 mg total) under the tongue every 5 (five) minutes x 3 doses as needed for chest pain. 25 tablet 3  . pantoprazole (PROTONIX) 40 MG tablet Take 1 tablet (40 mg total) by mouth daily at 12 noon. 30 tablet 6  . PARoxetine (PAXIL) 20 MG tablet Take 1 tablet (20 mg total) by mouth daily. 30 tablet 3   No current facility-administered medications for this visit.    Allergies:   Review of patient's allergies indicates no known allergies.    Social History:  The patient  reports that she has quit smoking. Her smoking use included Cigarettes. She has a 28 pack-year smoking history. She does not have any smokeless tobacco history on file. She reports that she drinks alcohol. She reports that she uses illicit drugs (Marijuana).   Family History:  The patient's family history includes Hyperlipidemia in her mother; Hypertension in her father and mother.    ROS:  Please see the history of present illness.   Otherwise, review of systems are positive for none.   All other systems are reviewed and negative.    PHYSICAL EXAM: VS:  There were no vitals taken for this visit. , BMI There is no weight on file to calculate BMI. GEN: Well nourished, well developed, in no acute distress HEENT: normal Neck: no JVD, carotid bruits, or masses Cardiac: RRR; no murmurs, rubs, or gallops,no edema  Respiratory:  clear to auscultation bilaterally, normal work of breathing GI: soft, nontender, nondistended, + BS MS: no deformity or atrophy Skin: warm and dry, no rash Neuro:  Strength and sensation are intact Psych: euthymic mood, full affect   EKG:  EKG is ordered today. The ekg ordered today demonstrates normal sinus rhythm with incomplete right bundle branch block and left anterior fascicular block.   Recent Labs: 08/11/2014: BUN 17; Creatinine 1.24*; HGB 15.4; Platelet 232; Potassium 3.7; Sodium 135    Lipid Panel    Component Value Date/Time   CHOL 129 02/13/2012 0525   TRIG  117 02/13/2012 0525   HDL 33* 02/13/2012 0525   CHOLHDL 3.9 02/13/2012 0525   VLDL 23 02/13/2012 0525   LDLCALC 73 02/13/2012 0525      Wt Readings from Last 3 Encounters:  06/28/15 168 lb (76.204 kg)  03/17/15 175 lb 11.2 oz (79.697 kg)  02/21/12 178 lb 4 oz (80.854 kg)         ASSESSMENT AND PLAN:  1.  Coronary artery disease involving native coronary arteries with other forms of angina: The patient has known history of coronary artery disease with previous drug-eluting stent placement to the left main coronary artery and balloon angioplasty of the LAD. She had recent chest pain in the setting of uncontrolled hypertension. I requested further evaluation with a treadmill nuclear stress test. I will have a low threshold for cardiac catheterization.  2. Chronic systolic heart failure: Due to ischemic cardiomyopathy with most  recent ejection fraction of 45%. Ejection fraction will be evaluated with nuclear stress test. If EF is low, we should consider treatment with an ACE inhibitor or ARB if there is no contraindication. Continue current dose of carvedilol.  3. Essential hypertension: Blood pressure is now controlled on carvedilol and small dose clonidine. However, an ACE inhibitor or ARB should be considered as outlined above.  4. Hyperlipidemia: She reports inability to afford treatment with statins as she does not have the $3 co-pay. The plan is to resume atorvastatin in the near future.  5. Tobacco use: I discussed with her the importance of cessation. She reports being under significant stress.  6. Chronic kidney disease: Followed by nephrology.    Disposition:   FU with me in 2 months  Signed,  Kathlyn Sacramento, MD  07/18/2015 2:03 PM    [place]

## 2015-08-03 NOTE — Interval H&P Note (Signed)
Cath Lab Visit (complete for each Cath Lab visit)  Clinical Evaluation Leading to the Procedure:   ACS: No.  Non-ACS:    Anginal Classification: CCS III  Anti-ischemic medical therapy: Minimal Therapy (1 class of medications)  Non-Invasive Test Results: Intermediate-risk stress test findings: cardiac mortality 1-3%/year  Prior CABG: No previous CABG      History and Physical Interval Note:  08/03/2015 8:43 AM  Angelica Herrera  has presented today for surgery, with the diagnosis of Lt Heart  Abn Myoview  The various methods of treatment have been discussed with the patient and family. After consideration of risks, benefits and other options for treatment, the patient has consented to  Procedure(s): Left Heart Cath and Coronary Angiography (Left) as a surgical intervention .  The patient's history has been reviewed, patient examined, no change in status, stable for surgery.  I have reviewed the patient's chart and labs.  Questions were answered to the patient's satisfaction.     Kathlyn Sacramento

## 2015-08-04 ENCOUNTER — Encounter: Payer: Self-pay | Admitting: Cardiovascular Disease

## 2015-08-17 ENCOUNTER — Encounter: Payer: Self-pay | Admitting: Physician Assistant

## 2015-08-18 ENCOUNTER — Encounter: Payer: Medicaid Other | Admitting: Physician Assistant

## 2015-08-28 ENCOUNTER — Other Ambulatory Visit: Payer: Self-pay

## 2015-08-28 DIAGNOSIS — F411 Generalized anxiety disorder: Secondary | ICD-10-CM

## 2015-08-28 MED ORDER — GABAPENTIN 300 MG PO CAPS: 300.0000 mg | ORAL_CAPSULE | Freq: Three times a day (TID) | ORAL | Status: DC

## 2015-08-28 NOTE — Telephone Encounter (Signed)
LM for pt to call the office.

## 2015-08-28 NOTE — Telephone Encounter (Signed)
Pt had refills but Angelica Herrera has left the state so her DEA# is invalid for refills

## 2015-08-28 NOTE — Telephone Encounter (Signed)
She needs an appt please to discuss; this is a controlled substance She should not be out already; have her take half of her usual dose until I see her and we'll wean her

## 2015-09-05 ENCOUNTER — Encounter: Payer: Self-pay | Admitting: Physician Assistant

## 2015-09-05 ENCOUNTER — Ambulatory Visit (INDEPENDENT_AMBULATORY_CARE_PROVIDER_SITE_OTHER): Payer: Medicaid Other | Admitting: Physician Assistant

## 2015-09-05 VITALS — BP 130/96 | HR 64 | Ht 67.0 in | Wt 162.8 lb

## 2015-09-05 DIAGNOSIS — I255 Ischemic cardiomyopathy: Secondary | ICD-10-CM

## 2015-09-05 DIAGNOSIS — E785 Hyperlipidemia, unspecified: Secondary | ICD-10-CM

## 2015-09-05 DIAGNOSIS — I2510 Atherosclerotic heart disease of native coronary artery without angina pectoris: Secondary | ICD-10-CM | POA: Diagnosis not present

## 2015-09-05 DIAGNOSIS — N182 Chronic kidney disease, stage 2 (mild): Secondary | ICD-10-CM

## 2015-09-05 DIAGNOSIS — I10 Essential (primary) hypertension: Secondary | ICD-10-CM

## 2015-09-05 DIAGNOSIS — I340 Nonrheumatic mitral (valve) insufficiency: Secondary | ICD-10-CM

## 2015-09-05 DIAGNOSIS — Z720 Tobacco use: Secondary | ICD-10-CM

## 2015-09-05 MED ORDER — CLOPIDOGREL BISULFATE 75 MG PO TABS: 75.0000 mg | ORAL_TABLET | Freq: Every day | ORAL | Status: DC

## 2015-09-05 MED ORDER — ATORVASTATIN CALCIUM 40 MG PO TABS: 40.0000 mg | ORAL_TABLET | Freq: Every day | ORAL | Status: DC

## 2015-09-05 MED ORDER — LISINOPRIL 5 MG PO TABS: 5.0000 mg | ORAL_TABLET | Freq: Every day | ORAL | Status: DC

## 2015-09-05 NOTE — Patient Instructions (Signed)
Medication Instructions:  Your physician has recommended you make the following change in your medication:  STOP taking clonidine START taking lisinopril 5mg  once daily START taking lipitor 40mg  once daily START taking plavix 75mg  once daily   Labwork: Lipid and CMET today BMET  On Monday  Testing/Procedures: Your physician has requested that you have an echocardiogram. Echocardiography is a painless test that uses sound waves to create images of your heart. It provides your doctor with information about the size and shape of your heart and how well your heart's chambers and valves are working. This procedure takes approximately one hour. There are no restrictions for this procedure.    Follow-Up: Your physician recommends that you schedule a follow-up appointment with [name] one week post echo   Any Other Special Instructions Will Be Listed Below (If Applicable).     If you need a refill on your cardiac medications before your next appointment, please call your pharmacy.  Echocardiogram An echocardiogram, or echocardiography, uses sound waves (ultrasound) to produce an image of your heart. The echocardiogram is simple, painless, obtained within a short period of time, and offers valuable information to your health care provider. The images from an echocardiogram can provide information such as:  Evidence of coronary artery disease (CAD).  Heart size.  Heart muscle function.  Heart valve function.  Aneurysm detection.  Evidence of a past heart attack.  Fluid buildup around the heart.  Heart muscle thickening.  Assess heart valve function. LET [facility] CARE PROVIDER KNOW ABOUT:  Any allergies you have.  All medicines you are taking, including vitamins, herbs, eye drops, creams, and over-the-counter medicines.  Previous problems you or members of your family have had with the use of anesthetics.  Any blood disorders you have.  Previous surgeries you have  had.  Medical conditions you have.  Possibility of pregnancy, if this applies. BEFORE THE PROCEDURE  No special preparation is needed. Eat and drink normally.  PROCEDURE   In order to produce an image of your heart, gel will be applied to your chest and a wand-like tool (transducer) will be moved over your chest. The gel will help transmit the sound waves from the transducer. The sound waves will harmlessly bounce off your heart to allow the heart images to be captured in real-time motion. These images will then be recorded.  You may need an IV to receive a medicine that improves the quality of the pictures. AFTER THE PROCEDURE You may return to your normal schedule including diet, activities, and medicines, unless your health care provider tells you otherwise.   This information is not intended to replace advice given to you by your health care provider. Make sure you discuss any questions you have with your health care provider.   Document Released: 03/29/2000 Document Revised: 04/22/2014 Document Reviewed: 12/07/2012 Elsevier Interactive Patient Education [employer].

## 2015-09-05 NOTE — Progress Notes (Signed)
Cardiology Office Note Date:  09/05/2015  Patient ID:  Anwitha, Hanlan [birth year], MRN [id number] PCP:  Bobetta Lime, MD  Cardiologist:  Dr. Fletcher Anon, MD    Chief Complaint: Follow up cardiac cath  History of Present Illness: DANEKA MCNEAR is a 45 y.o. female with history of CAD s/p anterolateral ST elevation MI in 01/2012 s/p PCI/DES to left main as below, ischemic cardiomyopathy, CKD stage IV, NSVT, HTN, HLD, hypothyroidism, and obesity who presents for post cardiac catheterization follow up.   She was lost to follow up from 2013 until recently when she re-established with Dr. Fletcher Anon, MD on 07/18/2015. Back in October 2013 she presented to the hospital with an acute anterolateral ST elevation MI. She underwent emergent cardiac catheterization by Dr. Burt Knack, MD which showed occlusion of the left main coronary artery. There was significant difficulty engaging the left main, though she ultimately underwent balloon angioplasty and drug-eluting stent placement successfully. She also had PTCA done to the LAD due to embolization from the left main. Post procedure she had significant hemodynamic instablity with VT and hypotension. She was noted to have severe MR initially, however subsequent echocardiograms showed an EF of 45% with no significant MR. In her interim loss to follow up she was followed by Dr. Lavera Guise, MD and had not had any hospitalizations. At her re-establishment visit with Dr. Fletcher Anon, MD she was noted to have ongoing tobacco abuse and had not been able to afford some of her medications, thus was not on a statin for that reason. She had recently had an episode of chest pain in the setting of elevated BP prior to that visit and was started on clonidine by Dr. Rolly Salter, MD (her nephrologist) but she reported significant drop in BP with clonidine and she decreased her dose to 0.1 mg daily. She was also noted to not be on an ACEi or ARB at that time, for unclear reasons. Because of her  episode of chest pain and prior left main stenting she underwent nuclear stress testing on 07/28/15 that showed mild anterior wall ischemia in the mid to apical region. EF was estimated at 33% with anteroseptal wall hypokinesis. This was overall a moderate risk study. She attempted Bruce protocol but was unable to achieve target HR and was changed to [employer]. Because of her abnormal stress test she was scheduled for cardiac cath which she had on 08/03/15 and showed a widely patent ostial left main stent with moderate mid LAD stenosis and occluded 2nd Diag with faint collaterals. The RCA was very large with mild stenosis. She was noted to have moderately reduced LV systolic function with an EF of 35-40% with significant anterior wall hypokinesis. Mildly elevated LVEDP. Lastly, she experienced significant radial artery spasm which required increased amount of sedation. It was recommended that she continue medical therapy for her CAD and chronic systolic CHF.   She has not had any chest pain since her cardiac catheterization. She does note continued fatigue and lethargy. Weight has been stable. No LE edema, early satiety, increased dyspnea, or PND. She reports taking aspirin 3-4 times weekly only 2/2 missing several days per week. Her blood pressure at home has been in the [id number] systolic range. She continues to smoke tobacco, but is now down to 1 pack every 3-4 days, when she previously smoked 2-3 packs daily. She is not doing any formal exercise at this time.     Past Medical History  Diagnosis Date  . HTN (hypertension)   . Hyperlipidemia   .  Tobacco abuse   . Obesity   . CKD (chronic kidney disease), stage IV ([place])   . Ischemic cardiomyopathy     a. 02/2012 Echo: EF 40-45%, ant/lat HK, Gr1 DD, Mild MR, PASP 4mmHg.  Marland Kitchen NSVT (nonsustained ventricular tachycardia) ([place])     a. 02/2012 post-MI  . Hypothyroidism   . CAD (coronary artery disease)     a. 02/2012 s/p anterolateral STEMI/Cath/PCI: LM 100  (3.0x15 DES ), LAD sm, severely diseased (PTCA w/ 1.26mm balloon), LCX nl, RI moderate size, nl, RCA 30-28m, AM/PDA/PLA nl, R->L collats noted; b. cath 08/03/15: widely patent ostLM stent at 10%, mLAD 60%,  mRCA 30%, EF 35-40%, ant wall HK, mildly elevated LVEDP    Past Surgical History  Procedure Laterality Date  . Abdominal hysterectomy    . Dilation and curettage of uterus    . Cardiac catheterization  2013    [facility] s/p stent  . Left heart catheterization with coronary angiogram N/A 02/11/2012    Procedure: LEFT HEART CATHETERIZATION WITH CORONARY ANGIOGRAM;  Surgeon: [name], MD;  Location: [facility] CATH LAB;  Service: Cardiovascular;  Laterality: N/A;  . Cardiac catheterization Left 08/03/2015    Procedure: Left Heart Cath and Coronary Angiography;  Surgeon: Wellington Hampshire, MD;  Location: [place] CV LAB;  Service: Cardiovascular;  Laterality: Left;    Current Outpatient Prescriptions  Medication Sig Dispense Refill  . ALPRAZolam (XANAX) 1 MG tablet Take 0.5-1 tablets (0.5-1 mg total) by mouth daily. 30 tablet 3  . aspirin 81 MG tablet Take 1 tablet (81 mg total) by mouth daily. 30 tablet   . carvedilol (COREG) 25 MG tablet TAKE 1 TABLET BY MOUTH TWICE A DAY 180 tablet 2  . fluticasone (FLONASE) 50 MCG/ACT nasal spray Place 2 sprays into both nostrils daily. 16 g 3  . furosemide (LASIX) 20 MG tablet TAKE 1 TABLET BY MOUTH TWICE A DAY 60 tablet 5  . gabapentin (NEURONTIN) 300 MG capsule Take 1 capsule (300 mg total) by mouth 3 (three) times daily. 90 capsule 0  . levothyroxine (SYNTHROID, LEVOTHROID) 150 MCG tablet TAKE 1 TABLET BY MOUTH EVERY DAY 90 tablet 1  . nitroGLYCERIN (NITROSTAT) 0.4 MG SL tablet Place 1 tablet (0.4 mg total) under the tongue every 5 (five) minutes x 3 doses as needed for chest pain. 25 tablet 3  . pantoprazole (PROTONIX) 40 MG tablet Take 1 tablet (40 mg total) by mouth daily at 12 noon. 30 tablet 6  . PARoxetine (PAXIL) 20 MG tablet Take 1 tablet (20 mg  total) by mouth daily. 30 tablet 3  . atorvastatin (LIPITOR) 40 MG tablet Take 1 tablet (40 mg total) by mouth daily. 30 tablet 3  . clopidogrel (PLAVIX) 75 MG tablet Take 1 tablet (75 mg total) by mouth daily. 30 tablet 3  . lisinopril (PRINIVIL,ZESTRIL) 5 MG tablet Take 1 tablet (5 mg total) by mouth daily. 30 tablet 3   No current facility-administered medications for this visit.    Allergies:   Review of patient's allergies indicates no known allergies.   Social History:  The patient  reports that she has been smoking Cigarettes.  She has a 30 pack-year smoking history. She does not have any smokeless tobacco history on file. She reports that she uses illicit drugs (Marijuana). She reports that she does not drink alcohol.   Family History:  The patient's family history includes Hyperlipidemia in her mother; Hypertension in her father and mother.  ROS:   Review of Systems  Constitutional: Positive for malaise/fatigue. Negative for fever, chills, weight loss and diaphoresis.  HENT: Negative for congestion.   Eyes: Negative for discharge and redness.  Respiratory: Negative for cough, hemoptysis, sputum production, shortness of breath and wheezing.   Cardiovascular: Negative for chest pain, palpitations, orthopnea, claudication, leg swelling and PND.  Gastrointestinal: Negative for heartburn, nausea, vomiting and abdominal pain.  Musculoskeletal: Negative for myalgias and falls.  Skin: Negative for rash.  Neurological: Positive for weakness. Negative for dizziness, sensory change, speech change, focal weakness and loss of consciousness.  Endo/Heme/Allergies: Does not bruise/bleed easily.  Psychiatric/Behavioral: Positive for substance abuse. The patient is not nervous/anxious.        Ongoing tobacco abuse at 1 pack every 3-4 days, previously 2-3 packs daily      PHYSICAL EXAM:  VS:  BP 130/96 mmHg  Pulse 64  Ht 5\' 7"  (1.702 m)  Wt 162 lb 12.8 oz (73.846 kg)  BMI 25.49 kg/m2 BMI:  Body mass index is 25.49 kg/(m^2). Well nourished, well developed, in no acute distress HEENT: normocephalic, atraumatic Neck: no JVD, carotid bruits or masses Cardiac:  normal S1, S2; RRR; II/VI systolic murmur RUSB, no rubs, or gallops Lungs:  clear to auscultation bilaterally, no wheezing, rhonchi or rales Abd: soft, nontender, no hepatomegaly, + BS MS: no deformity or atrophy Ext: no edema Skin: warm and dry, no rash Neuro:  moves all extremities spontaneously, no focal abnormalities noted, follows commands Psych: euthymic mood, full affect   EKG:  Was ordered and interpreted by me today. Shows NSR, 64 bpm, biatrial enlargement, incomplete RBBB, left anterior fascicular block, prior septal infarct, poor R wave progression, no acute st/t changes   Recent Labs: 08/01/2015: BUN 24*; Creatinine, Ser 1.25*; Hemoglobin 16.9*; Platelets 175; Potassium 3.4*; Sodium 139  No results found for requested labs within last 365 days.   CrCl cannot be calculated (Patient has no serum creatinine result on file.).   Wt Readings from Last 3 Encounters:  09/05/15 162 lb 12.8 oz (73.846 kg)  08/03/15 161 lb (73.029 kg)  07/18/15 161 lb 6.4 oz (73.211 kg)     Other studies reviewed: Additional studies/records reviewed today include: summarized above  ASSESSMENT AND PLAN:  1. CAD with history of anterolateral ST elevation MI in 01/2012 s/p PCI/DES to left main, as well as PTCA to LAD given distal embolization: Cardiac catheterization at that time showed an occlusion of the left main artery. Most recent cardiac catheterization 07/2015 showed a widely patent ostial left main stent, mid LAD 60% stenosis, mid RCA 30% stenosis, and moderate LV systolic dysfunction. She was initially on DAPT with aspirin and Effient in 2013, but has not been on a 2nd antiplatelet medication since, per her report, sometime in 2016. She does miss multiple doses of her aspirin weekly. Given the location of the stent along the  ostial left main and her medication noncompliance her case was discussed with her cardiologist, Dr. Fletcher Anon, MD, and it was advised she add Plavix 75 mg daily to her aspirin 81 mg daily. The importance of medication compliance was stressed to her. Continue Coreg 25 mg bid. Add Lipitor 40 mg daily and check cmet and lipid. She was also started on lisinopril 5 mg daily given her currently stable renal function with the planned tapering of her clonidine. She must discontinue tobacco abuse.   2. HTN: Well controlled today. Her symptoms of generalized fatigue did not begin until she was placed on clonidine. She is only taking this medication once daily,  in the morning. She will taper off this medication over the next several days while starting lisinopril 5 mg daily to replace clonidine. She will come back into the office on 5/30 for recheck bmet to assess for stable renal function. The severity of rebound hypertension was discussed with her in detail. If she notices spikes in BP she will restart clonidine.   3. ICM: She does not appear to be volume overloaded at today's exam. She will continue Coreg as above, Lasix 20 mg bid, and lisinopril has been added as above. She must limit salt and PO fluid consumption to less than 2 L daily. Call for weight gain > 3 pounds nightly or 5 pounds in 1 week.   4. HLD: Restarted Lipitor with cmet and lipid as above. Will recheck these labs in 6-8 weeks.  5. Ongoing tobacco abuse: Cessation is advised.   6. 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 Labs pending as above. Followed by nephrology.   7. Mitral regurgitation: Check echo.   Disposition: F/u with me after echocardiogram  Current medicines are reviewed at length with the patient today.  The patient did not have any concerns regarding medicines.  Melvern Banker PA-C 09/05/2015 5:12 PM     [address] [facility], [place] [address] [phone]

## 2015-09-06 ENCOUNTER — Other Ambulatory Visit: Payer: Self-pay

## 2015-09-06 DIAGNOSIS — E785 Hyperlipidemia, unspecified: Secondary | ICD-10-CM

## 2015-09-06 LAB — COMPREHENSIVE METABOLIC PANEL
A/G RATIO: 1.6 (ref 1.2–2.2)
ALK PHOS: 105 IU/L (ref 39–117)
ALT: 14 IU/L (ref 0–32)
AST: 15 IU/L (ref 0–40)
Albumin: 4.4 g/dL (ref 3.5–5.5)
BILIRUBIN TOTAL: 0.6 mg/dL (ref 0.0–1.2)
BUN/Creatinine Ratio: 13 (ref 9–23)
BUN: 17 mg/dL (ref 6–24)
CHLORIDE: 103 mmol/L (ref 96–106)
CO2: 24 mmol/L (ref 18–29)
Calcium: 10.2 mg/dL (ref 8.7–10.2)
Creatinine, Ser: 1.35 mg/dL — ABNORMAL HIGH (ref 0.57–1.00)
GFR calc non Af Amer: 47 mL/min/{1.73_m2} — ABNORMAL LOW (ref >59)
GFR, EST AFRICAN AMERICAN: 54 mL/min/{1.73_m2} — AB (ref >59)
Globulin, Total: 2.7 g/dL (ref 1.5–4.5)
Glucose: 90 mg/dL (ref 65–99)
POTASSIUM: 4.4 mmol/L (ref 3.5–5.2)
Sodium: 146 mmol/L — ABNORMAL HIGH (ref 134–144)
TOTAL PROTEIN: 7.1 g/dL (ref 6.0–8.5)

## 2015-09-06 LAB — LIPID PANEL
CHOL/HDL RATIO: 4.2 ratio (ref 0.0–4.4)
Cholesterol, Total: 206 mg/dL — ABNORMAL HIGH (ref 100–199)
HDL: 49 mg/dL (ref >39)
LDL CALC: 132 mg/dL — AB (ref 0–99)
TRIGLYCERIDES: 124 mg/dL (ref 0–149)
VLDL CHOLESTEROL CAL: 25 mg/dL (ref 5–40)

## 2015-09-07 ENCOUNTER — Ambulatory Visit (INDEPENDENT_AMBULATORY_CARE_PROVIDER_SITE_OTHER): Payer: Medicaid Other | Admitting: Family Medicine

## 2015-09-07 ENCOUNTER — Encounter: Payer: Self-pay | Admitting: Family Medicine

## 2015-09-07 ENCOUNTER — Telehealth: Payer: Self-pay | Admitting: Family Medicine

## 2015-09-07 VITALS — BP 106/74 | HR 66 | Temp 98.3°F | Resp 16 | Wt 163.0 lb

## 2015-09-07 DIAGNOSIS — Z720 Tobacco use: Secondary | ICD-10-CM | POA: Diagnosis not present

## 2015-09-07 DIAGNOSIS — F411 Generalized anxiety disorder: Secondary | ICD-10-CM | POA: Diagnosis not present

## 2015-09-07 DIAGNOSIS — B86 Scabies: Secondary | ICD-10-CM | POA: Diagnosis not present

## 2015-09-07 DIAGNOSIS — Z79899 Other long term (current) drug therapy: Secondary | ICD-10-CM | POA: Diagnosis not present

## 2015-09-07 DIAGNOSIS — E038 Other specified hypothyroidism: Secondary | ICD-10-CM | POA: Diagnosis not present

## 2015-09-07 DIAGNOSIS — E785 Hyperlipidemia, unspecified: Secondary | ICD-10-CM | POA: Diagnosis not present

## 2015-09-07 HISTORY — DX: Other long term (current) drug therapy: Z79.899

## 2015-09-07 MED ORDER — PERMETHRIN 5 % EX CREA: 1.0000 "application " | TOPICAL_CREAM | Freq: Once | CUTANEOUS | Status: DC

## 2015-09-07 MED ORDER — CLONAZEPAM 0.5 MG PO TABS: 0.5000 mg | ORAL_TABLET | Freq: Two times a day (BID) | ORAL | Status: DC | PRN

## 2015-09-07 NOTE — Progress Notes (Signed)
BP 106/74 mmHg  Pulse 66  Temp(Src) 98.3 F (36.8 C) (Oral)  Resp 16  Wt 163 lb (73.936 kg)  SpO2 97%   Subjective:    Patient ID: Angelica Herrera, female    DOB: 09-18-1970, 45 y.o.   MRN: [id number]  HPI: Angelica Herrera is a 45 y.o. female  Chief Complaint  Patient presents with  . Medication Refill  . Insect Bite    on arms after working in yard, itching to the point she is making sores   Patient is new to me; her previous provider left this practice She was out in the yard and suffered numerous bites or something, right arm invovled; no one else at home has similar places; she thinks it's chiggers; new places are continuing to come up, others are scabbing over; very pruritic She had a heart attack in 2013 around age 65; she has had high blood pressure since childbirth; sees Dr. Fletcher Anon; on plavix and atorvastatin; they found another blockage she says Her BP stays sky-high; on carvedilol and lisinopril (just added day before yesterday by her cardiologist) She has thyroid disease; managed her; numbers have been stable; no constipation or hair loss Also has degenerative disc disease She smokes cigarettes, but says she has cut down to 1/3 ppd from 2-3 packs a day; does not smoke in the house; she smokes from boredom; she has smoked since age 73 She takes paxil for depression; chronic depression; no thoughts of self-harm, not going to hurt anybody else; it was upped not long ago; on xanax for 10 years; has really bad social anxiety; even tapping on keyboard works her up; they have got her down to one a day; about to flip; not working to a counselor  Depression screen [facility] 2/9 09/07/2015 06/28/2015 03/17/2015  Decreased Interest 0 0 0  Down, Depressed, Hopeless 0 0 1  PHQ - 2 Score 0 0 1   Relevant past medical, surgical, family and social history reviewed Past Medical History  Diagnosis Date  . HTN (hypertension)   . Hyperlipidemia   . Tobacco abuse   . Obesity   . CKD  (chronic kidney disease), stage IV ([place])   . Ischemic cardiomyopathy     a. 02/2012 Echo: EF 40-45%, ant/lat HK, Gr1 DD, Mild MR, PASP 2mHg.  .Marland KitchenNSVT (nonsustained ventricular tachycardia) [place] [place]     a. 02/2012 post-MI  . Hypothyroidism   . CAD (coronary artery disease)     a. 02/2012 s/p anterolateral STEMI/Cath/PCI: LM 100 (3.0x15 DES ), LAD sm, severely diseased (PTCA w/ 1.548mballoon), LCX nl, RI moderate size, nl, RCA 30-4026mM/PDA/PLA nl, R->L collats noted; b. cath 08/03/15: widely patent ostLM stent at 10%, mLAD 60%,  mRCA 30%, EF 35-40%, ant wall HK, mildly elevated LVEDP  MD notes: DDD, anxiety, depressive disorder  Past Surgical History  Procedure Laterality Date  . Abdominal hysterectomy    . Dilation and curettage of uterus    . Cardiac catheterization  2013    [facility] s/p stent  . Left heart catheterization with coronary angiogram N/A 02/11/2012    Procedure: LEFT HEART CATHETERIZATION WITH CORONARY ANGIOGRAM;  Surgeon: MicSherren MochaD;  [facility];  Service: Cardiovascular;  Laterality: N/A;  . Cardiac catheterization Left 08/03/2015    Procedure: Left Heart Cath and Coronary Angiography;  Surgeon: MuhWellington HampshireD;  [facility];  Service: Cardiovascular;  Laterality: Left;   Family History  Problem Relation Age of Onset  .  Hypertension Father   . Hypertension Mother   . Hyperlipidemia Mother    Social History  Substance Use Topics  . Smoking status: Current Every Day Smoker -- 1.00 packs/day for 30 years    Types: Cigarettes  . Smokeless tobacco: None  . Alcohol Use: No  MD notes: patient told me that she smokes 1/2 ppd  Interim medical history since last visit reviewed. Allergies and medications reviewed  Review of Systems Per HPI unless specifically indicated above     Objective:    BP 106/74 mmHg  Pulse 66  Temp(Src) 98.3 F (36.8 C) (Oral)  Resp 16  Wt 163 lb (73.936 kg)  SpO2 97%  Wt Readings from Last 3  Encounters:  09/07/15 163 lb (73.936 kg)  09/05/15 162 lb 12.8 oz (73.846 kg)  08/03/15 161 lb (73.029 kg)    Physical Exam  Constitutional: She appears well-developed and well-nourished. No distress.  HENT:  Head: Normocephalic and atraumatic.  Eyes: EOM are normal. No scleral icterus.  Neck: No thyromegaly present.  Cardiovascular: Normal rate, regular rhythm and normal heart sounds.   No murmur heard. Pulmonary/Chest: Effort normal and breath sounds normal. No respiratory distress. She has no wheezes.  Abdominal: Soft. Bowel sounds are normal. She exhibits no distension.  Musculoskeletal: Normal range of motion. She exhibits no edema.  Neurological: She is alert. She exhibits normal muscle tone.  Skin: Skin is warm and dry. Rash (numerous lesions on the right arm; some excoriations suggestive of being several days old, many others are small, pinpoint flesh-colored to slightly erythematous with single point, no vesicles; no linear striations) noted. She is not diaphoretic. No pallor.  Psychiatric: She has a normal mood and affect. Her behavior is normal. Judgment and thought content normal. Her mood appears not anxious. Her affect is not blunt. Cognition and memory are not impaired. She does not express impulsivity or inappropriate judgment. She does not exhibit a depressed mood. She expresses no homicidal and no suicidal ideation.  Good eye contact with examiner; did not appear depressed or anxious or despondent; casually dressed; hair pulled back    Results for orders placed or performed in visit on 09/05/15  Lipid Profile  Result Value Ref Range   Cholesterol, Total 206 (H) 100 - 199 mg/dL   Triglycerides 124 0 - 149 mg/dL   HDL 49 >39 mg/dL   VLDL Cholesterol Cal 25 5 - 40 mg/dL   LDL Calculated 132 (H) 0 - 99 mg/dL   Chol/HDL Ratio 4.2 0.0 - 4.4 ratio units  Comp Met (CMET)  Result Value Ref Range   Glucose 90 65 - 99 mg/dL   BUN 17 6 - 24 mg/dL   Creatinine, Ser 1.35 (H)  0.57 - 1.00 mg/dL   GFR calc non Af Amer 47 (L) >59 mL/min/1.73   GFR calc Af Amer 54 (L) >59 mL/min/1.73   BUN/Creatinine Ratio 13 9 - 23   Sodium 146 (H) 134 - 144 mmol/L   Potassium 4.4 3.5 - 5.2 mmol/L   Chloride 103 96 - 106 mmol/L   CO2 24 18 - 29 mmol/L   Calcium 10.2 8.7 - 10.2 mg/dL   Total Protein 7.1 6.0 - 8.5 g/dL   Albumin 4.4 3.5 - 5.5 g/dL   Globulin, Total 2.7 1.5 - 4.5 g/dL   Albumin/Globulin Ratio 1.6 1.2 - 2.2   Bilirubin Total 0.6 0.0 - 1.2 mg/dL   Alkaline Phosphatase 105 39 - 117 IU/L   AST 15 0 - 40  IU/L   ALT 14 0 - 32 IU/L      Assessment & Plan:   Problem List Items Addressed This Visit      Endocrine   Hypothyroidism    Check TSH; I could not find TSH since 2013      Relevant Orders   TSH     Other   Controlled substance agreement signed    Discussed risk of controlled substances including possible unintentional overdose, especially if mixed with alcohol or other pills; typical speech given including illegal to share, even out of the goodness of patient's heart, always keep in the original bottle, safeguard medicine, do NOT mix with alcohol, other pain pills, "nerve" or anxiety pills, or sleeping pills; I am not obligated to approve of early refill or give new prescription if medicine is lost, stolen, or destroyed even with a police report, etc.; patient agrees with plan; controlled substance contract signed; copy of contract given to patient      GAD (generalized anxiety disorder)    Refer to psychiatrist; strongly encouraged therapy in addition to medical therapy; see Psych Today for list of therapist; in regards to the SSRI, she wants to continue where she is and have psychiatrist make next changes; I explained that I am not a fan of Xanax, though previous prescriber has had her on that; I will give her very limited amount of lower dose clonazepam; she has tapered down on the Xanax over the last 2 weeks or so; explained how this drug works,  addictive, similar to alcohol; want to get her off of benzo entirely hopefully in near future; only use the limited amount prescribed today for truly stormy moments; she agrees to try; typical speech given, do NOT mix with alcohol, pain pills, other nerve pills, etc.      Hyperlipidemia    On atorvastatin; encouraged diet low in saturated fats      Tobacco abuse    Encouraged smoking cessation; acknowledged that she has cut back on her smoking, but really the best thing for her is to quit entirely; see AVS       Other Visit Diagnoses    Scabies    -  Primary    I suspect scabies given appearance and different ages of lesions; treat; see AVS; call if not getting better       Follow up plan: No Follow-up on file.  An after-visit summary was printed and given to the patient at [place].  Please see the patient instructions which may contain other information and recommendations beyond what is mentioned above in the assessment and plan.  Meds ordered this encounter  Medications  . permethrin (ELIMITE) 5 % cream    Sig: Apply 1 application topically once.    Dispense:  60 g    Refill:  1  . clonazePAM (KLONOPIN) 0.5 MG tablet    Sig: Take 1 tablet (0.5 mg total) by mouth 2 (two) times daily as needed for anxiety. Use as sparingly as possible    Dispense:  20 tablet    Refill:  0    Orders Placed This Encounter  Procedures  . TSH

## 2015-09-07 NOTE — Assessment & Plan Note (Addendum)
Check TSH; I could not find TSH since 2013

## 2015-09-07 NOTE — Patient Instructions (Addendum)
I do encourage you to quit smoking Call [phone] to sign up for smoking cessation classes You can call 1-800-QUIT-NOW to talk with a smoking cessation coach  Use the new medicine; may retreat in 1 week if needed  Check out Psychology Today to find a therapist  Stop the Xanax and use clonazepam instead, and use ONLY when you have stormy moments  Have the thyroid checked soon   Scabies, Adult Scabies is a skin condition that happens when very small insects get under the skin (infestation). This causes a rash and severe itchiness. Scabies can spread from person to person (is contagious). If you get scabies, it is common for others in your household to get scabies too. With proper treatment, symptoms usually go away in 2-4 weeks. Scabies usually does not cause lasting problems. CAUSES This condition is caused by mites (Sarcoptes scabiei, or human itch mites) that can only be seen with a microscope. The mites get into the top layer of skin and lay eggs. Scabies can spread from person to person through:  Close contact with a person who has scabies.  Contact with infested items, such as towels, bedding, or clothing. RISK FACTORS This condition is more likely to develop in:  People who live in nursing homes and other extended-care facilities.  People who have sexual contact with a partner who has scabies.  Young children who attend child care facilities.  People who care for others who are at increased risk for scabies. SYMPTOMS Symptoms of this condition may include:  Severe itchiness. This is often worse at night.  A rash that includes tiny red bumps or blisters. The rash commonly occurs on the wrist, elbow, armpit, fingers, waist, groin, or buttocks. Bumps may form a line (burrow) in some areas.  Skin irritation. This can include scaly patches or sores. DIAGNOSIS This condition is diagnosed with a physical exam. Your health care provider will look closely at your skin. In some  cases, your health care provider may take a sample of your affected skin (skin scraping) and have it examined under a microscope. TREATMENT This condition may be treated with:  Medicated cream or lotion that kills the mites. This is spread on the entire body and left on for several hours. Usually, one treatment with medicated cream or lotion is enough to kill all of the mites. In severe cases, the treatment may be repeated.  Medicated cream that relieves itching.  Medicines that help to relieve itching.  Medicines that kill the mites. This treatment is rarely used. HOME CARE INSTRUCTIONS Medicines  Take or apply over-the-counter and prescription medicines as told by your health care provider.  Apply medicated cream or lotion as told by your health care provider.  Do not wash off the medicated cream or lotion until the necessary amount of time has passed. Skin Care  Avoid scratching your affected skin.  Keep your fingernails closely trimmed to reduce injury from scratching.  Take cool baths or apply cool washcloths to help reduce itching. General Instructions  Clean all items that you recently had contact with, including bedding, clothing, and furniture. Do this on the same day that your treatment starts.  Use hot water when you wash items.  Place unwashable items into closed, airtight plastic bags for at least 3 days. The mites cannot live for more than 3 days away from human skin.  Vacuum furniture and mattresses that you use.  Make sure that other people who may have been infested are examined by a  health care provider. These include members of your household and anyone who may have had contact with infested items.  Keep all follow-up visits as told by your health care provider. This is important. SEEK MEDICAL CARE IF:  You have itching that does not go away after 4 weeks of treatment.  You continue to develop new bumps or burrows.  You have redness, swelling, or pain  in your rash area after treatment.  You have fluid, blood, or pus coming from your rash.   This information is not intended to replace advice given to you by your health care provider. Make sure you discuss any questions you have with your health care provider.   Document Released: 12/21/2014 Document Reviewed: 11/01/2014 Elsevier Interactive Patient Education [employer].

## 2015-09-07 NOTE — Telephone Encounter (Signed)
Pt forgot to let you know that she needs refill on Gabepentine.

## 2015-09-07 NOTE — Assessment & Plan Note (Addendum)
Refer to psychiatrist; strongly encouraged therapy in addition to medical therapy; see Psych Today for list of therapist; in regards to the SSRI, she wants to continue where she is and have psychiatrist make next changes; I explained that I am not a fan of Xanax, though previous prescriber has had her on that; I will give her very limited amount of lower dose clonazepam; she has tapered down on the Xanax over the last 2 weeks or so; explained how this drug works, addictive, similar to alcohol; want to get her off of benzo entirely hopefully in near future; only use the limited amount prescribed today for truly stormy moments; she agrees to try; typical speech given, do NOT mix with alcohol, pain pills, other nerve pills, etc.

## 2015-09-07 NOTE — Telephone Encounter (Signed)
Tried calling patient to get the information Dr Sanda Klein needed an there was no answer.

## 2015-09-07 NOTE — Assessment & Plan Note (Signed)
On atorvastatin; encouraged diet low in saturated fats

## 2015-09-07 NOTE — Assessment & Plan Note (Addendum)
Encouraged smoking cessation; acknowledged that she has cut back on her smoking, but really the best thing for her is to quit entirely; see AVS

## 2015-09-07 NOTE — Assessment & Plan Note (Signed)

## 2015-09-07 NOTE — Telephone Encounter (Signed)
I just prescribed that on May 15th; she should not be out already; we did not talk about the reason she uses this during our appointment; please ask her what she takes this for, and then ask her to call her pharmacy in mid-June when refill is due; thank you

## 2015-09-08 ENCOUNTER — Telehealth: Payer: Self-pay | Admitting: Family Medicine

## 2015-09-08 ENCOUNTER — Other Ambulatory Visit: Payer: Self-pay | Admitting: Family Medicine

## 2015-09-08 DIAGNOSIS — E038 Other specified hypothyroidism: Secondary | ICD-10-CM

## 2015-09-08 LAB — TSH: TSH: 0.04 u[IU]/mL — ABNORMAL LOW (ref 0.450–4.500)

## 2015-09-08 MED ORDER — LEVOTHYROXINE SODIUM 125 MCG PO TABS: 125.0000 ug | ORAL_TABLET | Freq: Every day | ORAL | Status: DC

## 2015-09-08 NOTE — Telephone Encounter (Signed)
I called pt about labs She is going out of town, can't pick up new dose until she comes back Take 1/2 of the current dose 150 mcg = 75 mcg daily for Sat, Sunday, Monday Start new lower on Tuesday Recheck July 11th, TSH

## 2015-09-08 NOTE — Assessment & Plan Note (Signed)
Check TSH July 11th; dropping dose from 150 to 125 mcg daily given her hx of heart disease and anxiety

## 2015-09-12 ENCOUNTER — Other Ambulatory Visit: Payer: Medicaid Other

## 2015-09-18 ENCOUNTER — Ambulatory Visit: Payer: Medicaid Other | Admitting: Cardiovascular Disease

## 2015-09-20 ENCOUNTER — Encounter: Payer: Self-pay | Admitting: *Deleted

## 2015-09-20 ENCOUNTER — Other Ambulatory Visit: Payer: Medicaid Other

## 2015-09-22 ENCOUNTER — Telehealth: Payer: Self-pay | Admitting: Physician Assistant

## 2015-09-22 NOTE — Telephone Encounter (Signed)
Pt saw [name] 09/05/15. Echo and BMET ordered. Pt missed both appts.  Attempted to s/w pt today. Her mother-in-law answered home phone. Tried to get pt up to come to the phone. I heard pt in background asking MIL to take the information. MIL is not on DPR however, since I hear pt give permission, I proceeded to reviewed missed appts w/MIL. She took down information and states she will ask pt to return the call to reschedule appts.

## 2015-09-25 NOTE — Telephone Encounter (Signed)
Called home number and s/w female who states pt is not in at this time.  Did not leave message

## 2015-09-26 NOTE — Telephone Encounter (Signed)
Attempted to contact pt regarding missed BMET and echo appt. No answer, no voice mail at home number. Left message on cell voice mail for pt to contact office to reschedule. Letter mailed.

## 2015-09-29 ENCOUNTER — Ambulatory Visit: Payer: Medicaid Other | Admitting: Cardiovascular Disease

## 2015-10-02 ENCOUNTER — Other Ambulatory Visit: Payer: Self-pay | Admitting: Family Medicine

## 2015-10-02 ENCOUNTER — Encounter: Payer: Self-pay | Admitting: *Deleted

## 2015-10-04 ENCOUNTER — Other Ambulatory Visit: Payer: Self-pay | Admitting: Family Medicine

## 2015-10-05 NOTE — Telephone Encounter (Signed)
Continue to taper; 15 pills to last 30+ days

## 2015-10-06 ENCOUNTER — Ambulatory Visit (INDEPENDENT_AMBULATORY_CARE_PROVIDER_SITE_OTHER): Payer: Medicaid Other

## 2015-10-06 ENCOUNTER — Other Ambulatory Visit: Payer: Self-pay

## 2015-10-06 DIAGNOSIS — I340 Nonrheumatic mitral (valve) insufficiency: Secondary | ICD-10-CM

## 2015-10-06 DIAGNOSIS — I10 Essential (primary) hypertension: Secondary | ICD-10-CM

## 2015-10-06 LAB — ECHOCARDIOGRAM COMPLETE
Ao-asc: 28 cm
CHL CUP DOP CALC LVOT VTI: 17.7 cm
CHL CUP MV DEC (S): 299
E/e' ratio: 8.13
EWDT: 299 ms
FS: 15 % — AB (ref 28–44)
IVS/LV PW RATIO, ED: 0.83
LA ID, A-P, ES: 39 mm
LA diam index: 2.07 cm/m2
LA vol A4C: 43.4 ml
LA vol index: 26.6 mL/m2
LA vol: 50 mL
LEFT ATRIUM END SYS DIAM: 39 mm
LV E/e'average: 8.13
LV TDI E'LATERAL: 6.64
LV TDI E'MEDIAL: 4.68
LVEEMED: 8.13
LVELAT: 6.64 cm/s
LVOT area: 3.14 cm2
LVOT diameter: 20 mm
LVOTPV: 92 cm/s
LVOTSV: 56 mL
MV pk A vel: 80.6 m/s
MV pk E vel: 54 m/s
PW: 8.34 mm — AB (ref 0.6–1.1)
TAPSE: 22.2 mm

## 2015-10-10 ENCOUNTER — Other Ambulatory Visit: Payer: Self-pay

## 2015-10-10 ENCOUNTER — Telehealth: Payer: Self-pay | Admitting: Cardiovascular Disease

## 2015-10-10 DIAGNOSIS — I340 Nonrheumatic mitral (valve) insufficiency: Secondary | ICD-10-CM

## 2015-10-10 NOTE — Telephone Encounter (Signed)
S/w pt of need for BMET. Per 5/23 OV notes, pt to have repeat BMET 5/30 to assess renal fx as lisinopril 5mg  qd was added. Scheduled May 30, pt No Show. Attempted to reschedule labs w/pt who states she has transportation issues and prefers this lab be drawn along with lipid and liver on July 19. Advised pt to have BMET drawn now as it is past due but she prefers to wait.  She verbalized understanding of need/reason for this lab now but declines at this time. She had no further questions.

## 2015-11-01 ENCOUNTER — Other Ambulatory Visit: Payer: Medicaid Other

## 2015-11-02 ENCOUNTER — Other Ambulatory Visit: Payer: Self-pay | Admitting: Family Medicine

## 2015-11-06 ENCOUNTER — Other Ambulatory Visit: Payer: Medicaid Other

## 2015-11-06 ENCOUNTER — Ambulatory Visit: Payer: Medicaid Other | Admitting: Cardiovascular Disease

## 2015-11-06 DIAGNOSIS — E785 Hyperlipidemia, unspecified: Secondary | ICD-10-CM

## 2015-11-07 ENCOUNTER — Ambulatory Visit (INDEPENDENT_AMBULATORY_CARE_PROVIDER_SITE_OTHER): Payer: Medicaid Other | Admitting: Cardiovascular Disease

## 2015-11-07 ENCOUNTER — Encounter: Payer: Self-pay | Admitting: Cardiovascular Disease

## 2015-11-07 VITALS — BP 140/90 | HR 66 | Ht 67.0 in | Wt 167.8 lb

## 2015-11-07 DIAGNOSIS — I340 Nonrheumatic mitral (valve) insufficiency: Secondary | ICD-10-CM | POA: Diagnosis not present

## 2015-11-07 DIAGNOSIS — I2510 Atherosclerotic heart disease of native coronary artery without angina pectoris: Secondary | ICD-10-CM | POA: Diagnosis not present

## 2015-11-07 DIAGNOSIS — I5022 Chronic systolic (congestive) heart failure: Secondary | ICD-10-CM | POA: Diagnosis not present

## 2015-11-07 LAB — LIPID PANEL
CHOL/HDL RATIO: 1.9 ratio (ref 0.0–4.4)
CHOLESTEROL TOTAL: 109 mg/dL (ref 100–199)
HDL: 56 mg/dL (ref >39)
LDL Calculated: 42 mg/dL (ref 0–99)
TRIGLYCERIDES: 57 mg/dL (ref 0–149)
VLDL Cholesterol Cal: 11 mg/dL (ref 5–40)

## 2015-11-07 LAB — HEPATIC FUNCTION PANEL
ALBUMIN: 4.2 g/dL (ref 3.5–5.5)
ALT: 19 IU/L (ref 0–32)
AST: 16 IU/L (ref 0–40)
Alkaline Phosphatase: 110 IU/L (ref 39–117)
BILIRUBIN TOTAL: 0.5 mg/dL (ref 0.0–1.2)
BILIRUBIN, DIRECT: 0.22 mg/dL (ref 0.00–0.40)
Total Protein: 6 g/dL (ref 6.0–8.5)

## 2015-11-07 MED ORDER — LISINOPRIL 20 MG PO TABS
20.0000 mg | ORAL_TABLET | Freq: Every day | ORAL | 11 refills | Status: DC
Start: 2015-11-07 — End: 2016-05-09

## 2015-11-07 NOTE — Patient Instructions (Signed)
Medication Instructions:  Your physician has recommended you make the following change in your medication:  INCREASE Lisinopril to 20mg  daily. An Rx has been sent to your pharmacy  Labwork: None ordered  Testing/Procedures: None ordered  Follow-Up: Your physician wants you to follow-up in: 4 months with Dr.Arida You will receive a reminder letter in the mail two months in advance. If you don't receive a letter, please call our office to schedule the follow-up appointment.   Any Other Special Instructions Will Be Listed Below (If Applicable).     If you need a refill on your cardiac medications before your next appointment, please call your pharmacy.  Marland Kitchen

## 2015-11-07 NOTE — Progress Notes (Signed)
Cardiology Office Note   Date:  11/07/2015   ID:  Angelica Herrera, DOB Jun 13, 1970, MRN [id number]  PCP:  Bobetta Lime, MD  Cardiologist:   Kathlyn Sacramento, MD  Nephrologist: Dr. Rolly Salter  Chief Complaint  Patient presents with  . mitral valve regurgitation    cant sleep well  . Coronary Artery Disease      History of Present Illness: Angelica Herrera is a 45 y.o. female who For a follow-up visit regarding coronary artery disease and chronic systolic heart failure. She was last seen by me in 2013. She presented in October 2013 with acute anterolateral ST elevation myocardial infarction. She underwent emergent cardiac catheterization by Dr. Burt Knack which showed flush occlusion of left main coronary artery. There was significant difficulty in engaging the left main. Left main balloon angioplasty followed by drug-eluting stent placement was performed. She also had balloon angioplasty done to the LAD due to embolization from the left main. The patient had significant hemodynamic instability with ventricular tachycardia and hypotension.  She was noted to have severe mitral regurgitation initially. However, subsequent echocardiogram showed an ejection fraction of 45% with no significant mitral regurgitation.  The patient has not had follow-up with [place] since late 2013.  She continues to smoke one third of a pack per day.  She was seen by me in April and complain of intermittent episodes of chest pain. A pharmacologic nuclear stress test was done which showed possible anterior wall ischemia with ejection fraction of 33%. I proceeded with cardiac catheterization which showed patent left main stent with moderate mid LAD stenosis and occluded second diagonal. Ejection fraction was 35-40% with anterior wall hypokinesis. She was started on lisinopril and clonidine was stopped. She has been doing reasonably well but unfortunately she is dealing with significant stress related to separation. When  she is under stress, she developed some chest discomfort. She has chronic exertional dyspnea likely related to continued tobacco use. She smokes one third of a pack per day. I   Past Medical History:  Diagnosis Date  . CAD (coronary artery disease)    a. 02/2012 s/p anterolateral STEMI/Cath/PCI: LM 100 (3.0x15 DES ), LAD sm, severely diseased (PTCA w/ 1.69mm balloon), LCX nl, RI moderate size, nl, RCA 30-71m, AM/PDA/PLA nl, R->L collats noted; b. cath 08/03/15: widely patent ostLM stent at 10%, mLAD 60%,  mRCA 30%, EF 35-40%, ant wall HK, mildly elevated LVEDP  . CKD (chronic kidney disease), stage IV ([place])   . HTN (hypertension)   . Hyperlipidemia   . Hypothyroidism   . Ischemic cardiomyopathy    a. 02/2012 Echo: EF 40-45%, ant/lat HK, Gr1 DD, Mild MR, PASP 41mmHg.  Marland Kitchen NSVT (nonsustained ventricular tachycardia) ([place])    a. 02/2012 post-MI  . Obesity   . Tobacco abuse     Past Surgical History:  Procedure Laterality Date  . ABDOMINAL HYSTERECTOMY    . CARDIAC CATHETERIZATION  2013   [facility] s/p stent  . CARDIAC CATHETERIZATION Left 08/03/2015   Procedure: Left Heart Cath and Coronary Angiography;  Surgeon: Wellington Hampshire, MD;  Location: [place] [facility];  Service: Cardiovascular;  Laterality: Left;  . DILATION AND CURETTAGE OF UTERUS    . LEFT HEART CATHETERIZATION WITH CORONARY ANGIOGRAM N/A 02/11/2012   Procedure: LEFT HEART CATHETERIZATION WITH CORONARY ANGIOGRAM;  Surgeon: [name], MD;  Location: [facility] CATH LAB;  Service: Cardiovascular;  Laterality: N/A;     Current Outpatient Prescriptions  Medication Sig Dispense Refill  . aspirin 81 MG  tablet Take 1 tablet (81 mg total) by mouth daily. 30 tablet   . atorvastatin (LIPITOR) 40 MG tablet Take 1 tablet (40 mg total) by mouth daily. 30 tablet 3  . carvedilol (COREG) 25 MG tablet TAKE 1 TABLET BY MOUTH TWICE A DAY 180 tablet 2  . clonazePAM (KLONOPIN) 0.5 MG tablet TAKE 1 TABLET BY MOUTH TWICE A DAY AS NEEDED FOR ANXIETY.  USE AS SPARINGLY AS POSSIBLE 15 tablet 0  . clopidogrel (PLAVIX) 75 MG tablet Take 1 tablet (75 mg total) by mouth daily. 30 tablet 3  . furosemide (LASIX) 20 MG tablet TAKE 1 TABLET BY MOUTH TWICE A DAY 60 tablet 5  . gabapentin (NEURONTIN) 300 MG capsule TAKE 1 CAPSULE (300 MG TOTAL) BY MOUTH 3 (THREE) TIMES DAILY. 90 capsule 1  . levothyroxine (SYNTHROID, LEVOTHROID) 125 MCG tablet Take 1 tablet (125 mcg total) by mouth daily. 30 tablet 1  . lisinopril (PRINIVIL,ZESTRIL) 20 MG tablet Take 1 tablet (20 mg total) by mouth daily. 30 tablet 11  . nitroGLYCERIN (NITROSTAT) 0.4 MG SL tablet Place 1 tablet (0.4 mg total) under the tongue every 5 (five) minutes x 3 doses as needed for chest pain. 25 tablet 3  . PARoxetine (PAXIL) 20 MG tablet Take 1 tablet (20 mg total) by mouth daily. 30 tablet 3  . permethrin (ELIMITE) 5 % cream Apply 1 application topically once. 60 g 1   No current facility-administered medications for this visit.     Allergies:   Review of patient's allergies indicates no known allergies.    Social History:  The patient  reports that she has been smoking Cigarettes.  She has a 30.00 pack-year smoking history. She has never used smokeless tobacco. She reports that she uses drugs, including Marijuana. She reports that she does not drink alcohol.   Family History:  The patient's family history includes Hyperlipidemia in her mother; Hypertension in her father and mother.    ROS:  Please see the history of present illness.   Otherwise, review of systems are positive for none.   All other systems are reviewed and negative.    PHYSICAL EXAM: VS:  BP 140/90 (BP Location: Right Arm, Patient Position: Sitting, Cuff Size: Normal)   Pulse 66   Ht 5\' 7"  (1.702 m)   Wt 167 lb 12.8 oz (76.1 kg)   SpO2 98%   BMI 26.28 kg/m  , BMI Body mass index is 26.28 kg/m. GEN: Well nourished, well developed, in no acute distress  HEENT: normal  Neck: no JVD, carotid bruits, or  masses Cardiac: RRR; no murmurs, rubs, or gallops,no edema  Respiratory:  clear to auscultation bilaterally, normal work of breathing GI: soft, nontender, nondistended, + BS MS: no deformity or atrophy  Skin: warm and dry, no rash Neuro:  Strength and sensation are intact Psych: euthymic mood, full affect   EKG:  EKG is ordered today. The ekg ordered today demonstrates normal sinus rhythm with incomplete right bundle branch block and left anterior fascicular block.   Recent Labs: 08/01/2015: Hemoglobin 16.9; Platelets 175 09/05/2015: BUN 17; Creatinine, Ser 1.35; Potassium 4.4; Sodium 146 09/07/2015: TSH 0.040 11/06/2015: ALT 19    Lipid Panel    Component Value Date/Time   CHOL 109 11/06/2015 0829   TRIG 57 11/06/2015 0829   HDL 56 11/06/2015 0829   CHOLHDL 1.9 11/06/2015 0829   CHOLHDL 3.9 02/13/2012 0525   VLDL 23 02/13/2012 0525   LDLCALC 42 11/06/2015 0829  Wt Readings from Last 3 Encounters:  11/07/15 167 lb 12.8 oz (76.1 kg)  09/07/15 163 lb (73.9 kg)  09/05/15 162 lb 12.8 oz (73.8 kg)         ASSESSMENT AND PLAN:  1.  Coronary artery disease involving native coronary arteries with other forms of angina:  She is overall doing reasonably well with chest pain only when she is under stress. Continue medical therapy. Given previous left main stent, she is currently on dual antiplatelet therapy with aspirin and clopidogrel.   2. Chronic systolic heart failure: Due to ischemic cardiomyopathy with most recent ejection fraction 35-40%. No evidence of volume overload. Continue treatment with carvedilol. I increased the dose of lisinopril to 20 mg daily.   3. Essential hyperblood pressure is still not optimal. Lisinopril was increased as outlined above.  4. Hyperlipidemia: Most recent lipid profile showed significant improvement in LDL down to 42. Continue treatment with atorvastatin.   5. Tobacco use: I discussed with her the importance of cessation. She reports  being under significant stress.  6. Chronic kidney disease: Followed by nephrology. most recent creatinine was 1.35.     Disposition:   FU with me in 4 months  Signed,  Kathlyn Sacramento, MD  11/07/2015 5:43 PM    [place]

## 2015-11-08 ENCOUNTER — Telehealth: Payer: Self-pay | Admitting: Family Medicine

## 2015-11-08 DIAGNOSIS — F411 Generalized anxiety disorder: Secondary | ICD-10-CM

## 2015-11-08 MED ORDER — CLONAZEPAM 0.5 MG PO TABS: 0.5000 mg | ORAL_TABLET | Freq: Two times a day (BID) | ORAL | 0 refills | Status: DC | PRN

## 2015-11-08 NOTE — Assessment & Plan Note (Signed)
Refer to psych 

## 2015-11-23 NOTE — Telephone Encounter (Signed)
Referred to psych; closing note

## 2015-11-29 ENCOUNTER — Ambulatory Visit: Payer: Self-pay | Admitting: Licensed Clinical Social Worker

## 2015-12-07 ENCOUNTER — Other Ambulatory Visit: Payer: Self-pay | Admitting: Family Medicine

## 2015-12-08 ENCOUNTER — Other Ambulatory Visit: Payer: Self-pay | Admitting: Cardiovascular Disease

## 2015-12-08 ENCOUNTER — Other Ambulatory Visit: Payer: Self-pay

## 2015-12-08 DIAGNOSIS — F411 Generalized anxiety disorder: Secondary | ICD-10-CM

## 2015-12-08 MED ORDER — PAROXETINE HCL 20 MG PO TABS: 20.0000 mg | ORAL_TABLET | Freq: Every day | ORAL | 0 refills | Status: DC

## 2015-12-08 NOTE — Addendum Note (Signed)
Addended by: Isamar Wellbrock, Satira Anis on: 12/08/2015 11:54 AM   Modules accepted: Orders

## 2015-12-08 NOTE — Telephone Encounter (Signed)
Rx approved for Paxil

## 2015-12-08 NOTE — Telephone Encounter (Signed)
Pt notified, states she has to reschedule with pysch. Can you give 1 more refill on paxil?

## 2015-12-08 NOTE — Telephone Encounter (Signed)
Patient should be getting the clonazepam from behavioral health at this point I had tapered her down to the point that she can come off unless psychiatrist wants to continue so we'll decline the benzo I'll refill the other medicine Please remind her that she was due for recheck thyroid test in July Please ask her to schedule an appt in the next month

## 2015-12-08 NOTE — Telephone Encounter (Signed)
Please ask her to get lasix (furosemide) from Dr. Fletcher Anon from now on Please ask her to get paxil (paroxetine) from behavioral health from now on Thank you

## 2015-12-08 NOTE — Telephone Encounter (Signed)
Pt informed and scheduled

## 2015-12-12 ENCOUNTER — Ambulatory Visit: Payer: Medicaid Other | Admitting: Family Medicine

## 2016-01-11 ENCOUNTER — Telehealth: Payer: Self-pay | Admitting: Family Medicine

## 2016-01-11 ENCOUNTER — Other Ambulatory Visit: Payer: Self-pay | Admitting: Physician Assistant

## 2016-01-11 DIAGNOSIS — F411 Generalized anxiety disorder: Secondary | ICD-10-CM

## 2016-01-11 NOTE — Telephone Encounter (Signed)
Patient is overdue for labs and an appt I am sending 10 days of medicine, but we really want to recheck her thyroid test, along with fasting cholesterol and liver function We need to see if dose needs to be adjusted Please ask her to schedule an appt in the next month Thank you

## 2016-01-12 NOTE — Telephone Encounter (Signed)
Patient informed that prescriptions have been sent to the pharmacy. She did schedule an appointment for your first availability 01/25/16. Not sure if she will have enough medication to last until then. Also states that she missed her last appointment because her mom passed and she apologized.

## 2016-01-12 NOTE — Telephone Encounter (Signed)
I called, reached voicemail; left condolences for her loss

## 2016-01-25 ENCOUNTER — Ambulatory Visit (INDEPENDENT_AMBULATORY_CARE_PROVIDER_SITE_OTHER): Payer: Medicaid Other | Admitting: Family Medicine

## 2016-01-25 ENCOUNTER — Encounter: Payer: Self-pay | Admitting: Family Medicine

## 2016-01-25 VITALS — BP 140/98 | HR 83 | Temp 98.2°F | Resp 16 | Ht 67.0 in | Wt 173.4 lb

## 2016-01-25 DIAGNOSIS — E065 Other chronic thyroiditis: Secondary | ICD-10-CM

## 2016-01-25 DIAGNOSIS — F4321 Adjustment disorder with depressed mood: Secondary | ICD-10-CM

## 2016-01-25 DIAGNOSIS — J029 Acute pharyngitis, unspecified: Secondary | ICD-10-CM

## 2016-01-25 DIAGNOSIS — F4320 Adjustment disorder, unspecified: Secondary | ICD-10-CM

## 2016-01-25 DIAGNOSIS — F411 Generalized anxiety disorder: Secondary | ICD-10-CM | POA: Diagnosis not present

## 2016-01-25 DIAGNOSIS — E038 Other specified hypothyroidism: Secondary | ICD-10-CM

## 2016-01-25 LAB — TSH: TSH: 0.19 m[IU]/L — AB

## 2016-01-25 LAB — POCT RAPID STREP A (OFFICE): RAPID STREP A SCREEN: NEGATIVE

## 2016-01-25 MED ORDER — AMOXICILLIN-POT CLAVULANATE 875-125 MG PO TABS: 1.0000 | ORAL_TABLET | Freq: Two times a day (BID) | ORAL | 0 refills | Status: AC

## 2016-01-25 MED ORDER — FLUCONAZOLE 150 MG PO TABS: 150.0000 mg | ORAL_TABLET | Freq: Once | ORAL | 0 refills | Status: AC

## 2016-01-25 MED ORDER — LEVOTHYROXINE SODIUM 137 MCG PO TABS: 137.0000 ug | ORAL_TABLET | Freq: Every day | ORAL | 0 refills | Status: DC

## 2016-01-25 MED ORDER — HYDROXYZINE PAMOATE 25 MG PO CAPS: 25.0000 mg | ORAL_CAPSULE | Freq: Four times a day (QID) | ORAL | 0 refills | Status: DC | PRN

## 2016-01-25 MED ORDER — PAROXETINE HCL 40 MG PO TABS: 40.0000 mg | ORAL_TABLET | ORAL | 1 refills | Status: DC

## 2016-01-25 NOTE — Progress Notes (Signed)
BP (!) 140/98 (BP Location: Left Arm, Patient Position: Sitting, Cuff Size: Normal)   Pulse 83   Temp 98.2 F (36.8 C) (Oral)   Resp 16   Ht 5\' 7"  (1.702 m)   Wt 173 lb 7 oz (78.7 kg)   SpO2 97%   BMI 27.16 kg/m    Subjective:    Patient ID: Angelica Herrera, female    DOB: Jul 12, 1970, 45 y.o.   MRN: [id number]  HPI: Angelica Herrera is a 45 y.o. female  Chief Complaint  Patient presents with  . Follow-up    thyroid; questions about  paxil    She is here with sore throat, a little difficulty swallowing on the right side; right ear bothering her; low grade temp; ear very sharp pains; just started last night; no rash; some sinus drainage; ibuprofen; no rash  She has been crying; lost her mother 3 weeks ago; was through Hospice and she knows about grief counseling they offer; crying; has not been to the psychiatrist or counselor; not sleeping well Out of paxil x 4 days She had weaned the clonazepam off  She feels like her thyroid medicine is not strong enough; on 150 mcg, her hot flashes are not as bad; feels better at 150 mcg; however, last TSH 0.04 in May; was not able to come in July for labs; over the last three months, has been taking 125 mcg daily; out for 4 days  HTN; has not taken medicine yet today; she tries to stay away from salt  Depression screen [facility] 2/9 01/25/2016 01/25/2016 09/07/2015 06/28/2015 03/17/2015  Decreased Interest 3 0 0 0 0  Down, Depressed, Hopeless 3 0 0 0 1  PHQ - 2 Score 6 0 0 0 1  Altered sleeping 3 - - - -  Tired, decreased energy 3 - - - -  Change in appetite 3 - - - -  Feeling bad or failure about yourself  3 - - - -  Trouble concentrating 1 - - - -  Moving slowly or fidgety/restless 1 - - - -  Suicidal thoughts 0 - - - -  PHQ-9 Score 20 - - - -  MD note: no SI/HI  Relevant past medical, surgical, family and social history reviewed Past Medical History:  Diagnosis Date  . CAD (coronary artery disease)    a. 02/2012 s/p  anterolateral STEMI/Cath/PCI: LM 100 (3.0x15 DES ), LAD sm, severely diseased (PTCA w/ 1.67mm balloon), LCX nl, RI moderate size, nl, RCA 30-49m, AM/PDA/PLA nl, R->L collats noted; b. cath 08/03/15: widely patent ostLM stent at 10%, mLAD 60%,  mRCA 30%, EF 35-40%, ant wall HK, mildly elevated LVEDP  . CKD (chronic kidney disease), stage IV ([place])   . HTN (hypertension)   . Hyperlipidemia   . Hypothyroidism   . Ischemic cardiomyopathy    a. 02/2012 Echo: EF 40-45%, ant/lat HK, Gr1 DD, Mild MR, PASP 47mmHg.  Marland Kitchen NSVT (nonsustained ventricular tachycardia) ([place])    a. 02/2012 post-MI  . Obesity   . Tobacco abuse    Past Surgical History:  Procedure Laterality Date  . ABDOMINAL HYSTERECTOMY    . CARDIAC CATHETERIZATION  2013   [facility] s/p stent  . CARDIAC CATHETERIZATION Left 08/03/2015   Procedure: Left Heart Cath and Coronary Angiography;  Surgeon: Wellington Hampshire, MD;  Location: [place] [facility];  Service: Cardiovascular;  Laterality: Left;  . DILATION AND CURETTAGE OF UTERUS    . LEFT HEART CATHETERIZATION WITH CORONARY ANGIOGRAM N/A  02/11/2012   Procedure: LEFT HEART CATHETERIZATION WITH CORONARY ANGIOGRAM;  Surgeon: [name], MD;  Location: [facility] CATH LAB;  Service: Cardiovascular;  Laterality: N/A;   Family History  Problem Relation Age of Onset  . Hypertension Father   . Hypertension Mother   . Hyperlipidemia Mother    Social History  Substance Use Topics  . Smoking status: Current Every Day Smoker    Packs/day: 1.00    Years: 30.00    Types: Cigarettes  . Smokeless tobacco: Never Used  . Alcohol use No    Interim medical history since last visit reviewed. Allergies and medications reviewed  Review of Systems Per HPI unless specifically indicated above     Objective:    BP (!) 140/98 (BP Location: Left Arm, Patient Position: Sitting, Cuff Size: Normal)   Pulse 83   Temp 98.2 F (36.8 C) (Oral)   Resp 16   Ht 5\' 7"  (1.702 m)   Wt 173 lb 7 oz (78.7 kg)   SpO2  97%   BMI 27.16 kg/m   Wt Readings from Last 3 Encounters:  01/25/16 173 lb 7 oz (78.7 kg)  11/07/15 167 lb 12.8 oz (76.1 kg)  09/07/15 163 lb (73.9 kg)    Physical Exam  Constitutional: She appears well-developed and well-nourished. No distress.  HENT:  Right Ear: Hearing, tympanic membrane, external ear and ear canal normal.  Left Ear: Hearing, tympanic membrane, external ear and ear canal normal.  Nose: No rhinorrhea.  Mouth/Throat: Mucous membranes are normal. Oropharyngeal exudate, posterior oropharyngeal edema and posterior oropharyngeal erythema present.  Right side; no Ludwig's angina  Eyes: EOM are normal. No scleral icterus.  Neck: No thyromegaly present.  Cardiovascular: Normal rate.   Pulmonary/Chest: Effort normal.  Abdominal: She exhibits no distension.  Lymphadenopathy:    She has no cervical adenopathy.  Skin: No pallor.  Psychiatric: Her behavior is normal. Judgment and thought content normal. Her mood appears not anxious. She exhibits a depressed mood.  Good eye contact with examiner        Assessment & Plan:   Problem List Items Addressed This Visit      Endocrine   Hypothyroidism    Check level and adjust        Other   GAD (generalized anxiety disorder)    I do not plan to restart her benzodiazepines; she has been referred to psychiatrist; continue SSRI       Other Visit Diagnoses    Pharyngitis, unspecified etiology    -  Primary   start antibiotics; reasons to seek immediate medical care reviewed; will not use steroids, as OP widely patent; to ENT or ER if worse; culture pending   Relevant Orders   POCT rapid strep A (Completed)   Culture, Group A Strep (Completed)   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  An after-visit summary was printed and given to the patient at [facility].  Please see the patient instructions which may contain other information and recommendations beyond what is mentioned above in the assessment and plan.  Meds ordered this encounter  Medications  .  PARoxetine (PAXIL) 40 MG tablet    Sig: Take 1 tablet (40 mg total) by mouth every morning.    Dispense:  30 tablet    Refill:  1    Increasing the dose  . hydrOXYzine (VISTARIL) 25 MG capsule    Sig: Take 1 capsule (25 mg total) by mouth every 6 (six) hours as needed.    Dispense:  30 capsule    Refill:  0  . amoxicillin-clavulanate (AUGMENTIN) 875-125 MG tablet    Sig: Take 1 tablet by mouth 2 (two) times daily.    Dispense:  20 tablet    Refill:  0  . fluconazole (DIFLUCAN) 150 MG tablet    Sig: Take 1 tablet (150 mg total) by mouth once. Do not take cholesterol medicine for 3 days if you take this pill    Dispense:  1 tablet    Refill:  0  . DISCONTD: levothyroxine (SYNTHROID) 137 MCG tablet    Sig: Take 1 tablet (137 mcg total) by mouth daily before breakfast.    Dispense:  7 tablet    Refill:  0    Orders Placed This Encounter  Procedures  . Culture, Group A Strep  . TSH  . POCT rapid strep A

## 2016-01-25 NOTE — Patient Instructions (Addendum)
Increase the paroxetine from 20 mg to 40 mg daily Use the new medicine for anxiety and/or sleep; don't combine with any alcohol Start the new antibiotic Please do eat yogurt daily or take a probiotic daily for the next month or two We want to replace the healthy germs in the gut If you notice foul, watery diarrhea in the next two months, schedule an appointment RIGHT AWAY  Symptomatic care for the throat Gargle with salt water and spit out If symptoms worsen, pain, trouble swallowing, etc, then go to the emergency room Try dilute orange juice Avoid salty, spicy, crunchy foods   Pharyngitis Pharyngitis is a sore throat (pharynx). There is redness, pain, and swelling of your throat. HOME CARE   Drink enough fluids to keep your pee (urine) clear or pale yellow.  Only take medicine as told by your doctor.  You may get sick again if you do not take medicine as told. Finish your medicines, even if you start to feel better.  Do not take aspirin.  Rest.  Rinse your mouth (gargle) with salt water ( tsp of salt per 1 qt of water) every 1-2 hours. This will help the pain.  If you are not at risk for choking, you can suck on hard candy or sore throat lozenges. GET HELP IF:  You have large, tender lumps on your neck.  You have a rash.  You cough up green, yellow-brown, or bloody spit. GET HELP RIGHT AWAY IF:   You have a stiff neck.  You drool or cannot swallow liquids.  You throw up (vomit) or are not able to keep medicine or liquids down.  You have very bad pain that does not go away with medicine.  You have problems breathing (not from a stuffy nose). MAKE SURE YOU:   Understand these instructions.  Will watch your condition.  Will get help right away if you are not doing well or get worse.   This information is not intended to replace advice given to you by your health care provider. Make sure you discuss any questions you have with your health care provider.    Document Released: 09/18/2007 Document Revised: 01/20/2013 Document Reviewed: 12/07/2012 Elsevier Interactive Patient Education [employer].

## 2016-01-26 ENCOUNTER — Other Ambulatory Visit: Payer: Self-pay | Admitting: Family Medicine

## 2016-01-26 DIAGNOSIS — E038 Other specified hypothyroidism: Secondary | ICD-10-CM

## 2016-01-26 MED ORDER — LEVOTHYROXINE SODIUM 112 MCG PO TABS
112.0000 ug | ORAL_TABLET | Freq: Every day | ORAL | 1 refills | Status: DC
Start: 2016-01-26 — End: 2016-06-03

## 2016-01-26 NOTE — Assessment & Plan Note (Signed)
Lower the dose and recheck in 6 weeks

## 2016-01-26 NOTE — Progress Notes (Signed)
Lower the dose; check labs Nov 27th

## 2016-01-27 LAB — CULTURE, GROUP A STREP: Organism ID, Bacteria: NORMAL

## 2016-01-29 NOTE — Assessment & Plan Note (Signed)
I do not plan to restart her benzodiazepines; she has been referred to psychiatrist; continue SSRI

## 2016-01-29 NOTE — Assessment & Plan Note (Signed)
Check level and adjust

## 2016-02-02 IMAGING — CR DG CHEST 2V
1 series · 2 of 2 positions shown · non-contrast
Comparison: Single view of the chest 10/17/2013.

CLINICAL DATA: Shortness of breath and extremity swelling.
Intermittent chest pain.

EXAM:
CHEST  2 VIEW

[Series 1: w chest pa · 0.14mm/px · 2 of 2 slices shown]
[im 1/2]
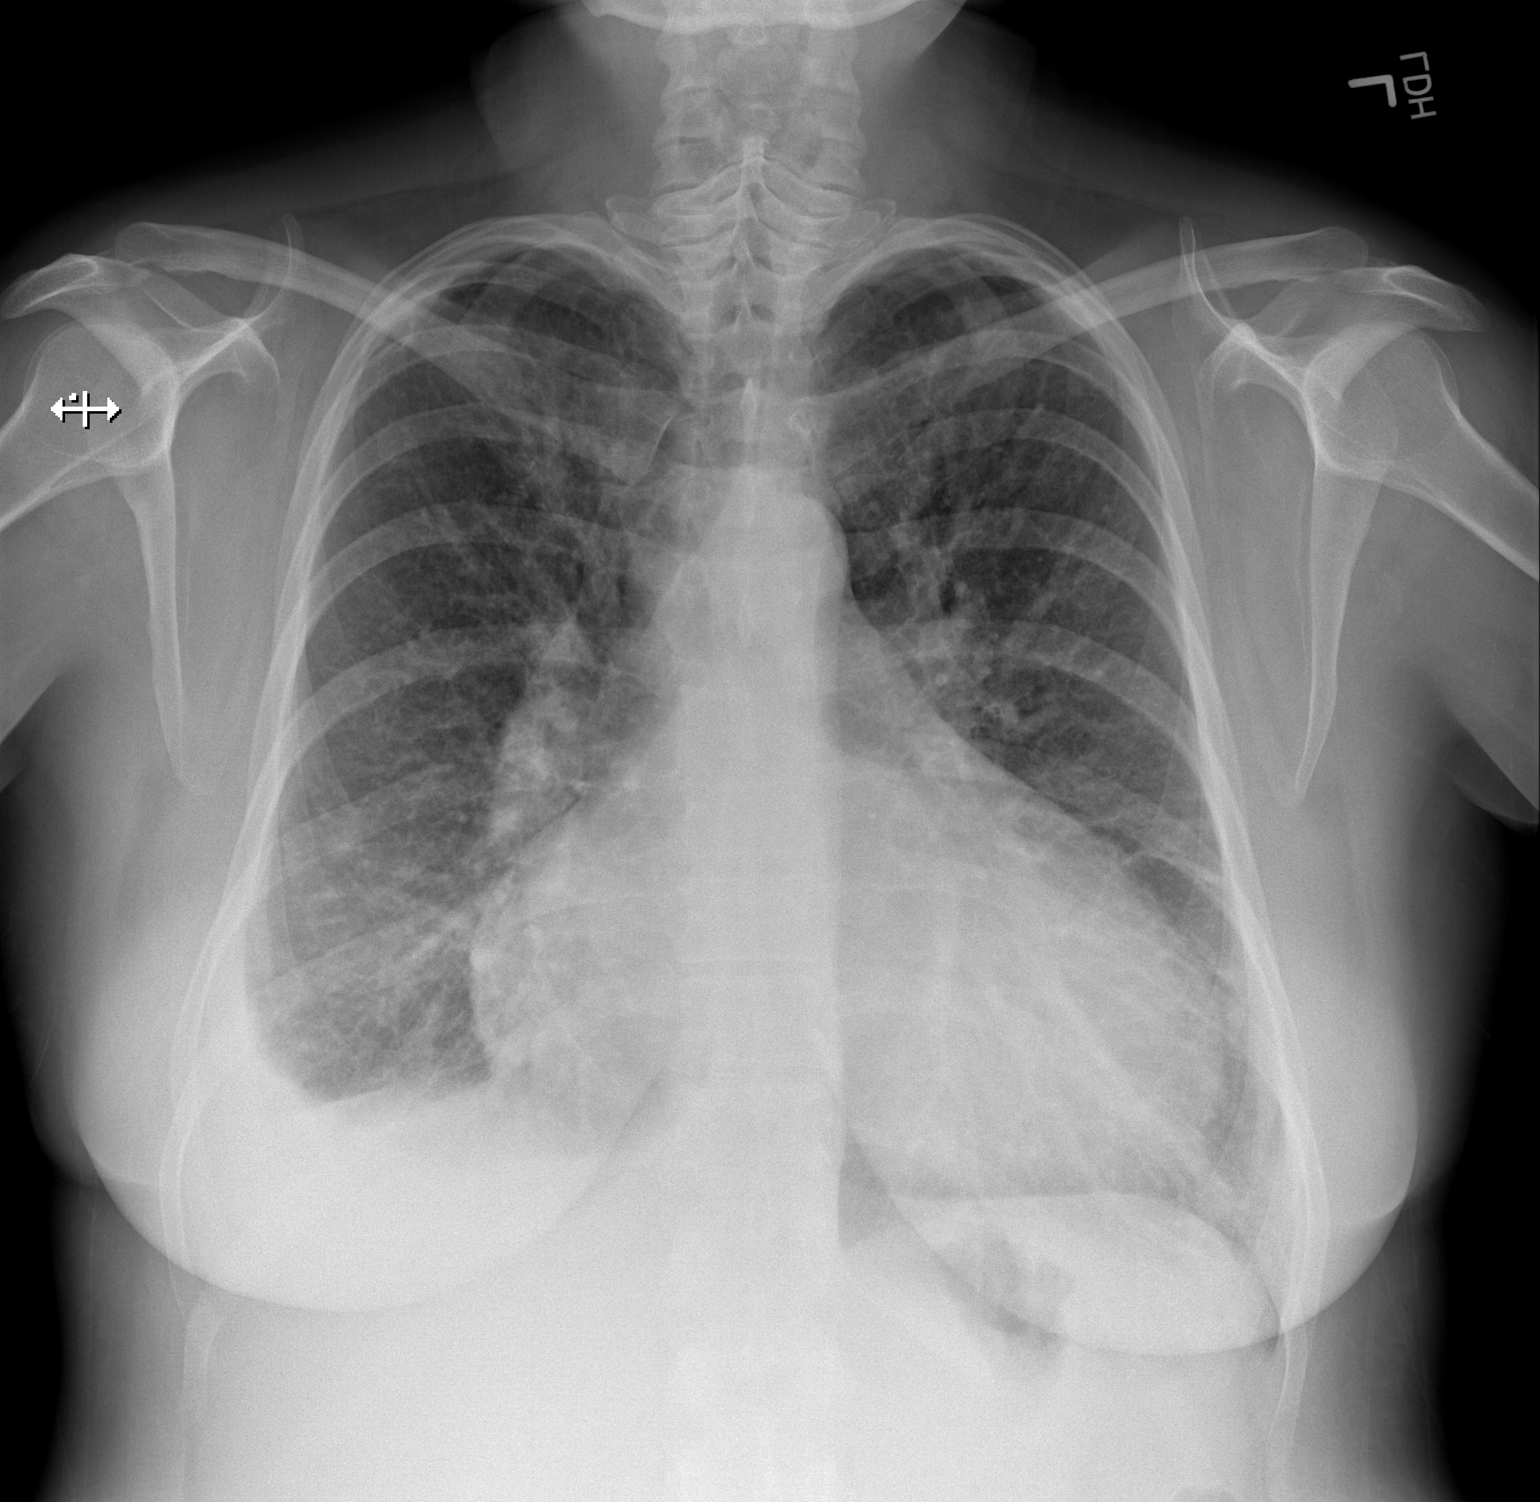
[im 2/2]
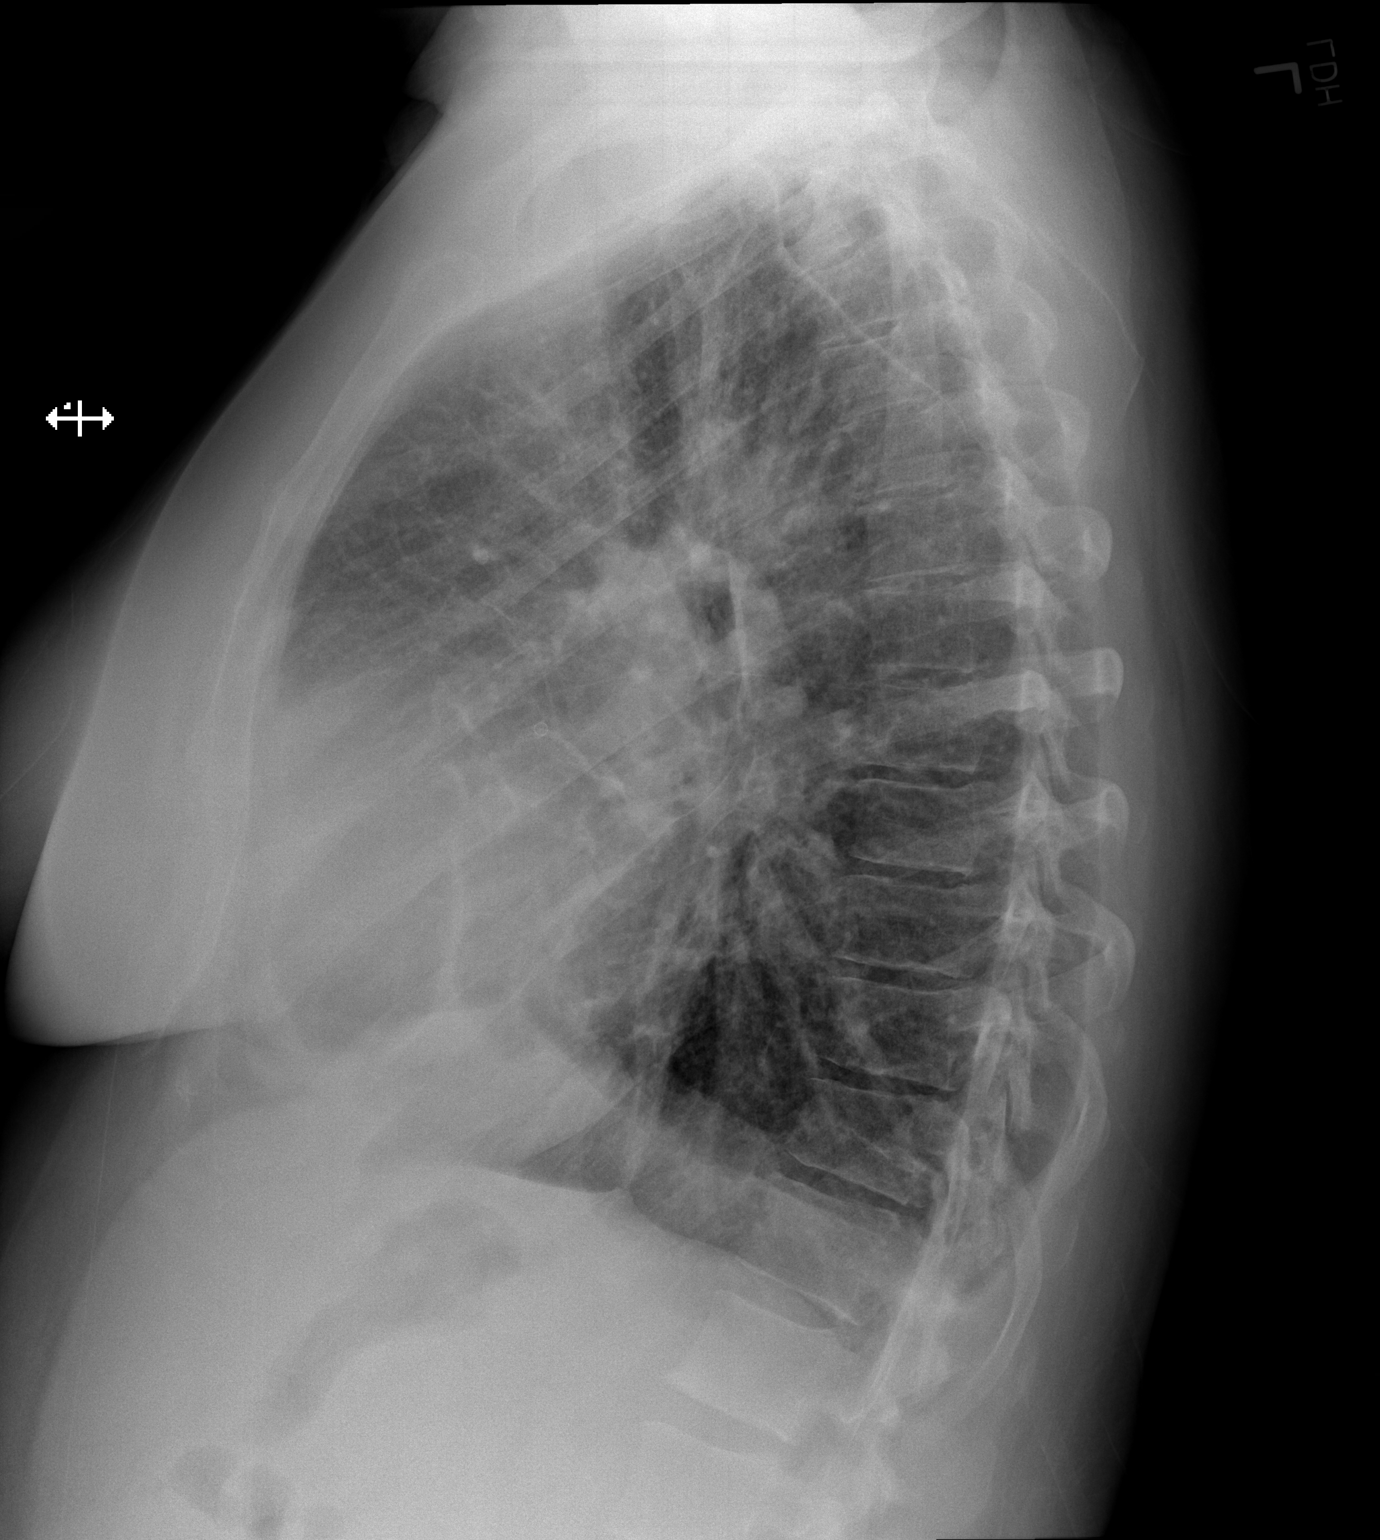

[2 of 2 positions shown; findings below may reference images not displayed]

FINDINGS: Blunting of the right costophrenic angle is unchanged. There is
cardiomegaly and interstitial pulmonary edema. No left effusion is
identified. There is no pneumothorax.
IMPRESSION: Cardiomegaly and interstitial edema.

Chronic, small right pleural effusion or pleural thickening.

## 2016-02-12 ENCOUNTER — Other Ambulatory Visit: Payer: Self-pay | Admitting: Family Medicine

## 2016-02-12 ENCOUNTER — Other Ambulatory Visit: Payer: Self-pay

## 2016-02-12 MED ORDER — CARVEDILOL 25 MG PO TABS: 25.0000 mg | ORAL_TABLET | Freq: Two times a day (BID) | ORAL | 1 refills | Status: DC

## 2016-02-12 NOTE — Telephone Encounter (Signed)
137 mcg thyroid med declined; she should be on 112 mcg and should not be out; rx of that sent 01/26/16

## 2016-02-26 ENCOUNTER — Ambulatory Visit: Payer: Medicaid Other | Admitting: Family Medicine

## 2016-04-11 ENCOUNTER — Other Ambulatory Visit: Payer: Self-pay | Admitting: Family Medicine

## 2016-04-11 ENCOUNTER — Telehealth: Payer: Self-pay | Admitting: Cardiovascular Disease

## 2016-04-11 ENCOUNTER — Other Ambulatory Visit: Payer: Self-pay | Admitting: Cardiovascular Disease

## 2016-04-11 DIAGNOSIS — R42 Dizziness and giddiness: Secondary | ICD-10-CM

## 2016-04-11 DIAGNOSIS — Z5181 Encounter for therapeutic drug level monitoring: Secondary | ICD-10-CM

## 2016-04-11 DIAGNOSIS — Z79899 Other long term (current) drug therapy: Secondary | ICD-10-CM

## 2016-04-11 NOTE — Telephone Encounter (Signed)
Patient c/o lightheadedness dizziness pre syncope upon standing.  Patient says this happens when moving from sitting to standing intermittently .  Patient feels tingle like feeling that moves from LE to head .  Scheduled next available with Ryan on 04/29/16 @ 130

## 2016-04-11 NOTE — Telephone Encounter (Signed)
S/w patient. Patient is experiencing lightheadedness and legs going weak upon changing positions from lying, sitting, standing. It does not happen everyday. It has happened 2 different days in the past week. She will sit a few minutes and the feeling will pass. BP 80/60, HR 60's during these episodes, the BP goes back up to normal per patient, 135/90. Encouraged patient to stay well hydrated which she says she's been doing. Patient is taking her medications as prescribed in chart.  She also complains of pain around her heart over left chest.  Chest pain is sharp and lasts 1-3 minutes and then goes away. It sometimes happens 1-2 times a day. She is out of her Nitroglycerin. Patient is not actively having chest pain or dizzy.  Advised patient to move slowly when changing positions. She was at her friend's house and was able to use her BP cuff but does not have one at home. Encouraged patient to borrow or get her own BP cuff in order to keep a BP log on a daily basis. Advised patient to call 911 or go to the ER if she develops new or worsening chest pain or ongoing dizziness or lightheadedness. She verbalized understanding.  Appt made with [name], PA at next available on 04/29/16. No appt available with Dr Fletcher Anon before that. Routing to Dr Fletcher Anon for further advice.

## 2016-04-12 ENCOUNTER — Other Ambulatory Visit
Admission: RE | Admit: 2016-04-12 | Discharge: 2016-04-12 | Disposition: A | Discharge status: Home / Self Care | Payer: Medicaid Other | Source: Ambulatory Visit | Attending: Cardiovascular Disease | Admitting: Cardiovascular Disease

## 2016-04-12 DIAGNOSIS — R42 Dizziness and giddiness: Secondary | ICD-10-CM

## 2016-04-12 DIAGNOSIS — Z79899 Other long term (current) drug therapy: Secondary | ICD-10-CM | POA: Diagnosis present

## 2016-04-12 DIAGNOSIS — Z5181 Encounter for therapeutic drug level monitoring: Secondary | ICD-10-CM | POA: Diagnosis present

## 2016-04-12 LAB — BASIC METABOLIC PANEL
ANION GAP: 13 (ref 5–15)
BUN: 17 mg/dL (ref 6–20)
CALCIUM: 9.3 mg/dL (ref 8.9–10.3)
CHLORIDE: 105 mmol/L (ref 101–111)
CO2: 24 mmol/L (ref 22–32)
Creatinine, Ser: 1.36 mg/dL — ABNORMAL HIGH (ref 0.44–1.00)
GFR calc Af Amer: 53 mL/min — ABNORMAL LOW (ref >60)
GFR calc non Af Amer: 46 mL/min — ABNORMAL LOW (ref >60)
GLUCOSE: 125 mg/dL — AB (ref 65–99)
Potassium: 3.1 mmol/L — ABNORMAL LOW (ref 3.5–5.1)
Sodium: 142 mmol/L (ref 135–145)

## 2016-04-12 MED ORDER — POTASSIUM CHLORIDE CRYS ER 20 MEQ PO TBCR: EXTENDED_RELEASE_TABLET | ORAL | 3 refills | Status: DC

## 2016-04-12 MED ORDER — CARVEDILOL 25 MG PO TABS: 12.5000 mg | ORAL_TABLET | Freq: Two times a day (BID) | ORAL | 1 refills | Status: DC

## 2016-04-12 MED ORDER — FUROSEMIDE 20 MG PO TABS: 20.0000 mg | ORAL_TABLET | Freq: Two times a day (BID) | ORAL | 3 refills | Status: DC

## 2016-04-12 MED ORDER — FUROSEMIDE 20 MG PO TABS: 20.0000 mg | ORAL_TABLET | Freq: Every day | ORAL | 3 refills | Status: DC

## 2016-04-12 NOTE — Telephone Encounter (Signed)
Would confirm dose of lasix (BID?) If taking twice a day, would decrease down to one a day Needs BMP (possible dehydration, also need to check potassium) Is she taking potassium supplement? Decrease coreg down to 12.5 mg BID Monitor BP (orthostatics)

## 2016-04-12 NOTE — Telephone Encounter (Signed)
S/w patient. She is not taking potassium. Patient given instructions on decreasing Lasix to 20 mg once a day and coreg to 12.5 mg BID.  She will go to [facility] today for BMP. She will have to find a ride. I stressed the importance of getting the labwork to check her potassium and BMP. She verbalized understanding of instructions.  She has not been able to get in touch with her friend who has the BP cuff to borrow.  So far today, she has NOT had any episodes of chest pain or dizziness.

## 2016-04-12 NOTE — Telephone Encounter (Signed)
Spoke with Dr Rockey Situ concerning patient BMP from today. He advised for patient to start potassium 20 mEq BID for 3 days, then 20 mEq daily. Resume furosemide 20mg  by mouth BID. Coreg 12.5 mg by mouth BID. Patient may decrease lisinopril to 10mg  daily if dizziness/lightheadedness continues. Keep appt on 04/30/15.  Called patient and gave her these instructions. She wrote them down and read them back and verbalized understanding.

## 2016-04-17 ENCOUNTER — Other Ambulatory Visit: Payer: Self-pay

## 2016-04-17 NOTE — Telephone Encounter (Signed)
Patient no showed for her appt in November Please ask her to reschedule Find out how she's doing on the paroxetine please, then back to me

## 2016-04-17 NOTE — Telephone Encounter (Signed)
Patient notified will schedule an appt, She states paxil is doing well.

## 2016-04-17 NOTE — Telephone Encounter (Signed)
Glitch in our system; I was not receiving electronic refill requests from Dec 13 until yesterday; addressed with IT; addressing now ------------------------------------ Blank note

## 2016-04-18 MED ORDER — PAROXETINE HCL 40 MG PO TABS: 40.0000 mg | ORAL_TABLET | ORAL | 1 refills | Status: DC

## 2016-04-18 MED ORDER — GABAPENTIN 300 MG PO CAPS: 300.0000 mg | ORAL_CAPSULE | Freq: Three times a day (TID) | ORAL | 1 refills | Status: DC

## 2016-04-18 NOTE — Telephone Encounter (Signed)
Thank you rxs approved

## 2016-04-29 ENCOUNTER — Ambulatory Visit: Payer: Self-pay | Admitting: Physician Assistant

## 2016-05-02 ENCOUNTER — Ambulatory Visit: Payer: Self-pay | Admitting: Physician Assistant

## 2016-05-03 ENCOUNTER — Ambulatory Visit: Payer: Medicaid Other | Admitting: Family Medicine

## 2016-05-09 ENCOUNTER — Ambulatory Visit (INDEPENDENT_AMBULATORY_CARE_PROVIDER_SITE_OTHER): Payer: Medicaid Other | Admitting: Physician Assistant

## 2016-05-09 ENCOUNTER — Other Ambulatory Visit
Admission: RE | Admit: 2016-05-09 | Discharge: 2016-05-09 | Disposition: A | Discharge status: Home / Self Care | Payer: Medicaid Other | Source: Ambulatory Visit | Attending: Physician Assistant | Admitting: Physician Assistant

## 2016-05-09 ENCOUNTER — Encounter: Payer: Self-pay | Admitting: Physician Assistant

## 2016-05-09 VITALS — BP 156/98 | HR 64 | Ht 66.0 in | Wt 170.0 lb

## 2016-05-09 DIAGNOSIS — R42 Dizziness and giddiness: Secondary | ICD-10-CM | POA: Insufficient documentation

## 2016-05-09 DIAGNOSIS — I739 Peripheral vascular disease, unspecified: Secondary | ICD-10-CM

## 2016-05-09 DIAGNOSIS — R55 Syncope and collapse: Secondary | ICD-10-CM | POA: Insufficient documentation

## 2016-05-09 DIAGNOSIS — I5022 Chronic systolic (congestive) heart failure: Secondary | ICD-10-CM

## 2016-05-09 DIAGNOSIS — I2510 Atherosclerotic heart disease of native coronary artery without angina pectoris: Secondary | ICD-10-CM

## 2016-05-09 DIAGNOSIS — R0602 Shortness of breath: Secondary | ICD-10-CM | POA: Insufficient documentation

## 2016-05-09 DIAGNOSIS — Z79899 Other long term (current) drug therapy: Secondary | ICD-10-CM | POA: Insufficient documentation

## 2016-05-09 DIAGNOSIS — I951 Orthostatic hypotension: Secondary | ICD-10-CM | POA: Diagnosis not present

## 2016-05-09 LAB — BASIC METABOLIC PANEL
Anion gap: 10 (ref 5–15)
BUN: 32 mg/dL — AB (ref 6–20)
CHLORIDE: 104 mmol/L (ref 101–111)
CO2: 24 mmol/L (ref 22–32)
CREATININE: 1.74 mg/dL — AB (ref 0.44–1.00)
Calcium: 9.8 mg/dL (ref 8.9–10.3)
GFR calc Af Amer: 39 mL/min — ABNORMAL LOW (ref >60)
GFR calc non Af Amer: 34 mL/min — ABNORMAL LOW (ref >60)
GLUCOSE: 87 mg/dL (ref 65–99)
POTASSIUM: 4.1 mmol/L (ref 3.5–5.1)
Sodium: 138 mmol/L (ref 135–145)

## 2016-05-09 LAB — MAGNESIUM: MAGNESIUM: 2.5 mg/dL — AB (ref 1.7–2.4)

## 2016-05-09 LAB — TSH: TSH: 1.352 u[IU]/mL (ref 0.350–4.500)

## 2016-05-09 MED ORDER — MAGNESIUM OXIDE 400 MG PO TABS: 400.0000 mg | ORAL_TABLET | Freq: Every day | ORAL | Status: DC

## 2016-05-09 MED ORDER — CARVEDILOL 12.5 MG PO TABS: 12.5000 mg | ORAL_TABLET | Freq: Two times a day (BID) | ORAL | 3 refills | Status: DC

## 2016-05-09 MED ORDER — LISINOPRIL 10 MG PO TABS: 10.0000 mg | ORAL_TABLET | Freq: Every day | ORAL | 3 refills | Status: DC

## 2016-05-09 NOTE — Progress Notes (Signed)
Cardiology Office Note Date:  05/09/2016  Patient ID:  Angelica Herrera, Angelica Herrera 22-May-1970, MRN [id number] PCP:  Enid Derry, MD  Cardiologist:  Dr. Fletcher Anon, MD    Chief Complaint: Weakness, dizziness, fatigue, and presyncope  History of Present Illness: Angelica Herrera is a 46 y.o. female with history of CAD s/p anterolateral ST elevation MI in 01/2012 s/p PCI/DES to left main as below, ischemic cardiomyopathy, CKD stage IV, NSVT, HTN, HLD, hypothyroidism, and obesity who presents for evaluation of generalized weakness, dizziness, fatigue, and presyncope.   She was lost to follow up from 2013 until recently when she re-established with Dr. Fletcher Anon, MD on 07/18/2015. Back in October 2013 she presented to the hospital with an acute anterolateral ST elevation MI. She underwent emergent cardiac catheterization by Dr. Burt Knack, MD which showed occlusion of the left main coronary artery. There was significant difficulty engaging the left main, though she ultimately underwent balloon angioplasty and drug-eluting stent placement successfully. She also had PTCA done to the LAD due to embolization from the left main. Post procedure she had significant hemodynamic instablity with VT and hypotension. She was noted to have severe MR initially, however subsequent echocardiograms showed an EF of 45% with no significant MR. In her interim loss to follow up she was followed by Dr. Lavera Guise, MD and had not had any hospitalizations. At her re-establishment visit with Dr. Fletcher Anon, MD she was noted to have ongoing tobacco abuse and had not been able to afford some of her medications, thus was not on a statin for that reason. She had recently had an episode of chest pain in the setting of elevated BP prior to that visit and was started on clonidine by Dr. Rolly Salter, MD (her nephrologist) but she reported significant drop in BP with clonidine and she decreased her dose to 0.1 mg daily. She was also noted to not be on an ACEi or ARB  at that time, for unclear reasons. Because of her episode of chest pain and prior left main stenting she underwent nuclear stress testing on 07/28/15 that showed mild anterior wall ischemia in the mid to apical region. EF was estimated at 33% with anteroseptal wall hypokinesis. This was overall a moderate risk study. She attempted Bruce protocol but was unable to achieve target HR and was changed to [employer]. Because of her abnormal stress test she was scheduled for cardiac cath which she had on 08/03/15 and showed a widely patent ostial left main stent with moderate mid LAD stenosis and occluded 2nd Diag with faint collaterals. The RCA was very large with mild stenosis. She was noted to have moderately reduced LV systolic function with an EF of 35-40% with significant anterior wall hypokinesis. Mildly elevated LVEDP. Lastly, she experienced significant radial artery spasm which required increased amount of sedation. It was recommended that she continue medical therapy for her CAD and chronic systolic CHF. In follow up on 09/05/15 she had not had any further chest pain with continued medication compliance issues, only taking ASA 3-4 times weekly. BP had been running in the 433 systolic range at home. She continued to smoke tobacco. She was continued on Coreg 25 mg bid, Lasix 20 mg bid, and lisinopril 5 mg daily was added. Her clonidine was tapered off 2/2 fatigue. Echocardiogram was checked given her history of mitral regurgitation that showed an EF of 35-40%, hypokinesis of the anterior and anteroseptal myocardium, GR1DD, calcified mitral annulus with mild mitral regurgitation, RV systolic function was normal, PASP normal. She was  most recently seen by Dr. Fletcher Anon on 11/07/15 with increased stress noted 2/2 separation. BP was noted to be 140/90 at that time. Her lisinopril was increased to 20 mg daily. She was continued on Coreg 25 mg bid and Lasix 20 mg bid.   She called on 12/28 with complaints of dizziness upon  standing. BP was noted to be soft during these episodes at 80/60 per patient with heart rate in the 60's bpm. BP returns to baseline of 417 systolic shortly after standing. She also noted left-sided chest pain, lasting 1-3 minutes before resolving. Had been taking SL NTG. Dr. Rockey Situ decreased Coreg to 12.5 mg bid and Lasix to 20 mg daily. Bmet was checked on 12/29 (she had missed her prior lab appointments) which showed potassium of 3.1 and SCr of 1.36 (baseline 1.2). Dr. Rockey Situ resumed Lasix 20 mg bid with KCl and continued Coreg 12.5 mg bid. He also recommended decreasing lisinopril to 10 mg daily if dizziness persisted.   Unfortunately, she tells me today she never made the above medication changes and has continued to take Coreg 25 mg bid, lisinopril 20 mg daily along with Lasix 20 mg bid. She has continued to note significant drops in her blood pressure from the 408'X systolic to the 44'Y systolic with positional changes with associated dizziness and presyncope. She has not had a syncopal episode. She drinks one glass of water per day with the remaining fluid consumption being [facility]. She has not had any chest pain. She does feel weak and notes a generalized fatigue. She has been taking DAPT with ASA and Plavix consistently without missing any doses. Secondly, she mention bilateral calf muscle cramping/soreness with ambulation. Notes she can walk across a room and develop this pain. She notes the longer she walks the more pain she develops in her legs. Resting improves this pain. At times, taking an extra KCl has helped too.    Past Medical History:  Diagnosis Date  . CAD (coronary artery disease)    a. 02/2012 s/p anterolateral STEMI/Cath/PCI: LM 100 (3.0x15 DES ), LAD sm, severely diseased (PTCA w/ 1.82mm balloon), LCX nl, RI moderate size, nl, RCA 30-75m, AM/PDA/PLA nl, R->L collats noted; b. cath 08/03/15: widely patent ostLM stent at 10%, mLAD 60%,  mRCA 30%, EF 35-40%, ant wall HK, mildly  elevated LVEDP  . CKD (chronic kidney disease), stage IV ([place])   . HTN (hypertension)   . Hyperlipidemia   . Hypothyroidism   . Ischemic cardiomyopathy    a. 02/2012 Echo: EF 40-45%, ant/lat HK, Gr1 DD, Mild MR, PASP 28mmHg.  Marland Kitchen NSVT (nonsustained ventricular tachycardia) ([place])    a. 02/2012 post-MI  . Obesity   . Tobacco abuse     Past Surgical History:  Procedure Laterality Date  . ABDOMINAL HYSTERECTOMY    . CARDIAC CATHETERIZATION  2013   [facility] s/p stent  . CARDIAC CATHETERIZATION Left 08/03/2015   Procedure: Left Heart Cath and Coronary Angiography;  Surgeon: Wellington Hampshire, MD;  Location: [place] CV LAB;  Service: Cardiovascular;  Laterality: Left;  . DILATION AND CURETTAGE OF UTERUS    . LEFT HEART CATHETERIZATION WITH CORONARY ANGIOGRAM N/A 02/11/2012   Procedure: LEFT HEART CATHETERIZATION WITH CORONARY ANGIOGRAM;  Surgeon: [name], MD;  Location: [facility] CATH LAB;  Service: Cardiovascular;  Laterality: N/A;    Current Outpatient Prescriptions  Medication Sig Dispense Refill  . aspirin 81 MG tablet Take 1 tablet (81 mg total) by mouth daily. 30 tablet   . atorvastatin (  LIPITOR) 40 MG tablet TAKE 1 TABLET (40 MG TOTAL) BY MOUTH DAILY. 30 tablet 3  . carvedilol (COREG) 25 MG tablet Take 0.5 tablets (12.5 mg total) by mouth 2 (two) times daily. (Patient taking differently: Take 25 mg by mouth 2 (two) times daily. ) 180 tablet 1  . clopidogrel (PLAVIX) 75 MG tablet TAKE 1 TABLET (75 MG TOTAL) BY MOUTH ONCE A DAY 30 tablet 3  . furosemide (LASIX) 20 MG tablet Take 1 tablet (20 mg total) by mouth 2 (two) times daily. 180 tablet 3  . gabapentin (NEURONTIN) 300 MG capsule Take 1 capsule (300 mg total) by mouth 3 (three) times daily. 90 capsule 1  . hydrOXYzine (VISTARIL) 25 MG capsule Take 1 capsule (25 mg total) by mouth every 6 (six) hours as needed. 30 capsule 0  . levothyroxine (SYNTHROID, LEVOTHROID) 112 MCG tablet Take 1 tablet (112 mcg total) by mouth daily. Labs due  Nov 27th 30 tablet 1  . lisinopril (PRINIVIL,ZESTRIL) 20 MG tablet Take 1 tablet (20 mg total) by mouth daily. 30 tablet 11  . nitroGLYCERIN (NITROSTAT) 0.4 MG SL tablet Place 1 tablet (0.4 mg total) under the tongue every 5 (five) minutes x 3 doses as needed for chest pain. 25 tablet 3  . PARoxetine (PAXIL) 40 MG tablet Take 1 tablet (40 mg total) by mouth every morning. 30 tablet 1  . potassium chloride SA (K-DUR,KLOR-CON) 20 MEQ tablet Take 20 mEq (1 tablet) by mouth twice a day for 3 days, then take 83mEq (1 tablet) once a day. (Patient taking differently: Take 20 mEq by mouth daily. Take 20 mEq (1 tablet) by mouth twice a day for 3 days, then take 47mEq (1 tablet) once a day.) 90 tablet 3   No current facility-administered medications for this visit.     Allergies:   Patient has no known allergies.   Social History:  The patient  reports that she has been smoking Cigarettes.  She has a 30.00 pack-year smoking history. She has never used smokeless tobacco. She reports that she drinks alcohol. She reports that she does not use drugs.   Family History:  The patient's family history includes Hyperlipidemia in her mother; Hypertension in her father and mother.  ROS:   Review of Systems  Constitutional: Positive for malaise/fatigue. Negative for chills, diaphoresis, fever and weight loss.  HENT: Negative for congestion.   Eyes: Negative for discharge and redness.  Respiratory: Positive for shortness of breath. Negative for cough, hemoptysis, sputum production and wheezing.   Cardiovascular: Positive for claudication. Negative for chest pain, palpitations, orthopnea, leg swelling and PND.  Gastrointestinal: Negative for abdominal pain, blood in stool, heartburn, melena, nausea and vomiting.  Genitourinary: Negative for hematuria.  Musculoskeletal: Negative for falls and myalgias.  Skin: Negative for rash.  Neurological: Positive for dizziness and weakness. Negative for tingling, tremors,  sensory change, speech change, focal weakness, loss of consciousness and headaches.  Endo/Heme/Allergies: Does not bruise/bleed easily.  Psychiatric/Behavioral: Negative for substance abuse. The patient is not nervous/anxious.   All other systems reviewed and are negative.    PHYSICAL EXAM:  VS:  BP (!) 156/98 (BP Location: Right Arm, Patient Position: Sitting, Cuff Size: Normal)   Pulse 64   Ht 5\' 6"  (1.676 m)   Wt 170 lb (77.1 kg)   BMI 27.44 kg/m  BMI: Body mass index is 27.44 kg/m.  Physical Exam  Constitutional: She is oriented to person, place, and time. She appears well-developed and well-nourished.  HENT:  Head: Normocephalic and atraumatic.  Eyes: Right eye exhibits no discharge. Left eye exhibits no discharge.  Neck: Normal range of motion. No JVD present.  Cardiovascular: Normal rate, regular rhythm, S1 normal, S2 normal and normal heart sounds.  Exam reveals no distant heart sounds, no friction rub, no midsystolic click and no opening snap.   No murmur heard. Pulses:      Posterior tibial pulses are 2+ on the right side, and 1+ on the left side.  Pulmonary/Chest: Effort normal and breath sounds normal. No respiratory distress. She has no decreased breath sounds. She has no wheezes. She has no rales. She exhibits no tenderness.  Abdominal: Soft. She exhibits no distension. There is no tenderness.  Musculoskeletal: She exhibits no edema.  Neurological: She is alert and oriented to person, place, and time.  Skin: Skin is warm and dry. No cyanosis. Nails show no clubbing.  Psychiatric: She has a normal mood and affect. Her speech is normal and behavior is normal. Judgment and thought content normal.     EKG:  Was ordered and interpreted by me today. Shows NSR, 64 bpm, poor R wave progression, left anterior fascicular block, no acute st/t changes   Recent Labs: 08/01/2015: Hemoglobin 16.9; Platelets 175 11/06/2015: ALT 19 01/25/2016: TSH 0.19 04/12/2016: BUN 17;  Creatinine, Ser 1.36; Potassium 3.1; Sodium 142  11/06/2015: Chol/HDL Ratio 1.9; Cholesterol, Total 109; HDL 56; LDL Calculated 42; Triglycerides 57   CrCl cannot be calculated (Patient's most recent lab result is older than the maximum 21 days allowed.).   Wt Readings from Last 3 Encounters:  05/09/16 170 lb (77.1 kg)  01/25/16 173 lb 7 oz (78.7 kg)  11/07/15 167 lb 12.8 oz (76.1 kg)     Other studies reviewed:  Orthostatic Vital Signs: Lying:139/96, 63 bpm Sitting:124/91, 66 bpm Standing: 89/61, 99 bpm Stand x 3 minutes: 137/94, 66 bpm  Additional studies/records reviewed today include: summarized above  ASSESSMENT AND PLAN:  1. Orthostatic hypotension: Orthostatic vital signs are significantly positive in the office today. She drinks 1 glass of water daily followed by several [employer]. Increase water consumption to 8, 8 oz glasses of water daily. Decrease Mountain Dew consumption. She did not make the medication decrease adjustments in late December 2017 as advised. She has continued to take Coreg 25 mg bid, lisinopril 20 mg daily, Lasix 20 mg bid. Today, we will decrease Coreg to 12.5 mg bid, decrease lisinopril to 10 mg daily. Continue Lasix 20 mg bid with KCl repletion of 20 meq daily.   2. Fatigue/SOB: Possibly 2/2 her hypotension, dehydration, and deconditioning. Will check nuclear stress test to evaluate for high-risk ischemia. Increase fluids as above. Would benefit from cardiac rehab. Check tsh, as her levothyroxine was recently decreased several weeks ago per patient.    3. CAD as above: No angina. Has not missed any doses of DAPT. Continue lifelong ASA 81 mg and Plavix 75 mg daily. Coreg as above. Continue Lipitor. Stress testing as above.   4. Chronic systolic CHF/ICM: She does not appear to be volume overloaded at this time. She does appear to be dehydrated as above. She will increase fluid consumption as above, though it was discussed with her to be mindful of her  volume status and watch for possible fluid overload given her cardiomyopathy. Continue Lasix 20 mg bid and Coreg as above. CHF education.  5. Possible claudication: Check LE arterial ultrasound and ABI bilaterally. Check bmet, magnesium and tsh.   6. Tobacco abuse: Cessation is  advised.   Disposition: F/u with me in 10-14 days.  Current medicines are reviewed at length with the patient today.  The patient did not have any concerns regarding medicines.  Melvern Banker PA-C 05/09/2016 3:14 PM     [address] [place],  [id number] [phone]

## 2016-05-09 NOTE — Patient Instructions (Addendum)
Medication Instructions:  Your physician has recommended you make the following change in your medication:  1- DECREASE Coreg to 12.5 mg by mouth twice a day. 2- DECREASE Lisinopril to 10 mg by mouth once a day. 3- START Magnesium Oxide 400 mg by mouth once a day.   Labwork: Your physician recommends that you return for lab work in: TODAY (BMP, Mg, TSH).   Testing/Procedures: Your physician has requested that you have a lower extremity arterial doppler- During this test, ultrasound is used to evaluate arterial blood flow in the legs. Allow approximately one hour for this exam.   Your physician has requested that you have an ankle brachial index (ABI). During this test an ultrasound and blood pressure cuff are used to evaluate the arteries that supply the arms and legs with blood. Allow thirty minutes for this exam. There are no restrictions or special instructions.  Your physician has requested that you have a lexiscan myoview. For further information please visit [email or web address]. Please follow instruction sheet, as given.  [place]  Your caregiver has ordered a Stress Test with nuclear imaging. The purpose of this test is to evaluate the blood supply to your heart muscle. This procedure is referred to as a "Non-Invasive Stress Test." This is because other than having an IV started in your vein, nothing is inserted or "invades" your body. Cardiac stress tests are done to find areas of poor blood flow to the heart by determining the extent of coronary artery disease (CAD). Some patients exercise on a treadmill, which naturally increases the blood flow to your heart, while others who are  unable to walk on a treadmill due to physical limitations have a pharmacologic/chemical stress agent called Lexiscan . This medicine will mimic walking on a treadmill by temporarily increasing your coronary blood flow.   Please note: these test may take anywhere between 2-4 hours to complete  PLEASE  REPORT TO [facility] AT THE FIRST DESK WILL DIRECT YOU WHERE TO GO  Date of Procedure:____  _02/06/2018____________  Arrival Time for Procedure:_______07:45 am  ___________    Instructions regarding medication:   ---- Hold CARVEDOLOL night before procedure and morning of procedure.     PLEASE NOTIFY THE OFFICE AT LEAST 21 HOURS IN ADVANCE IF YOU ARE UNABLE TO KEEP YOUR APPOINTMENT.  [phone] AND  PLEASE NOTIFY NUCLEAR MEDICINE AT [facility] AT LEAST 24 HOURS IN ADVANCE IF YOU ARE UNABLE TO KEEP YOUR APPOINTMENT. [phone]  How to prepare for your Myoview test:  1. Do not eat or drink after midnight 2. No caffeine for 24 hours prior to test 3. No smoking 24 hours prior to test. 4. Your medication may be taken with water.  If your doctor stopped a medication because of this test, do not take that medication. 5. Ladies, please do not wear dresses.  Skirts or pants are appropriate. Please wear a short sleeve shirt. 6. No perfume, cologne or lotion. 7. Wear comfortable walking shoes. No heels!   Follow-Up: Your physician recommends that you schedule a follow-up appointment in: 10-14 [place], [place].   Any Other Special Instructions Will Be Listed Below (If Applicable). Your provider recommends that you drink eight 8 oz glassed of WATER per day to increase your fluid intake. Limit sodas and caffeineated beverages.    If you need a refill on your cardiac medications before your next appointment, please call your pharmacy.  Cardiac Nuclear Scanning A cardiac nuclear scan is used  to check your heart for problems, such as the following:  A portion of the heart is not getting enough blood.  Part of the heart muscle has died, which happens with a heart attack.  The heart wall is not working normally.  In this test, a radioactive dye (tracer) is injected into your bloodstream. After the tracer has traveled to your heart, a scanning device  is used to measure how much of the tracer is absorbed by or distributed to various areas of your heart. LET [facility] CARE PROVIDER KNOW ABOUT:  Any allergies you have.  All medicines you are taking, including vitamins, herbs, eye drops, creams, and over-the-counter medicines.  Previous problems you or members of your family have had with the use of anesthetics.  Any blood disorders you have.  Previous surgeries you have had.  Medical conditions you have.  RISKS AND COMPLICATIONS Generally, this is a safe procedure. However, as with any procedure, problems can occur. Possible problems include:   Serious chest pain.  Rapid heartbeat.  Sensation of warmth in your chest. This usually passes quickly. BEFORE THE PROCEDURE Ask your health care provider about changing or stopping your regular medicines. PROCEDURE This procedure is usually done at a hospital and takes 2-4 hours.  An IV tube is inserted into one of your veins.  Your health care provider will inject a small amount of radioactive tracer through the tube.  You will then wait for 20-40 minutes while the tracer travels through your bloodstream.  You will lie down on an exam table so images of your heart can be taken. Images will be taken for about 15-20 minutes.  You will exercise on a treadmill or stationary bike. While you exercise, your heart activity will be monitored with an electrocardiogram (ECG), and your blood pressure will be checked.  If you are unable to exercise, you may be given a medicine to make your heart beat faster.  When blood flow to your heart has peaked, tracer will again be injected through the IV tube.  After 20-40 minutes, you will get back on the exam table and have more images taken of your heart.  When the procedure is over, your IV tube will be removed. AFTER THE PROCEDURE  You will likely be able to leave shortly after the test. Unless your health care provider tells you otherwise,  you may return to your normal schedule, including diet, activities, and medicines.  Make sure you find out how and when you will get your test results. This information is not intended to replace advice given to you by your health care provider. Make sure you discuss any questions you have with your health care provider. Document Released: 04/26/2004 Document Revised: 04/06/2013 Document Reviewed: 03/10/2013 Elsevier Interactive Patient Education  2017 [place] Index Test Introduction The ankle-brachial index (ABI) test is used to find peripheral vascular disease (PVD). PVD is also known as peripheral arterial disease (PAD). PVD is the blocking or hardening of the arteries anywhere within the circulatory system beyond the heart. PVD is caused by cholesterol deposits in your blood vessels (atherosclerosis). These deposits cause arteries to narrow. The delivery of oxygen to your tissues is impaired as a result. This can cause muscle pain and fatigue. This is called claudication. PVD means there may also be buildup of cholesterol in your:  Heart. This increases the risk of heart attacks.  Brain. This increases the risk of strokes. The ankle-brachial index test measures the blood flow in your  arms and legs. This test also determines if blood vessels in your leg are narrowed by cholesterol deposits. There are additional causes of a reduced ankle-brachial index, such as inflammation of vessels or a clot in the vessels. However, these are much less common than narrowing due to cholesterol deposits. What is being tested? The test is done while you are lying down and resting. Measurements are taken of the systolic pressure:  In your arm (brachial).  In your ankle at several points along your leg. Systolic pressure is the pressure inside your arteries when your heart pumps. The measurements are taken several times on both sides. Then, the highest systolic pressure of the ankle is  divided by the highest brachial systolic pressure. The result is the ankle-brachial pressure ratio, or ABI. Sometimes this test is repeated after you have exercised on a treadmill for five minutes. You may have leg pain during the exercise portion of the test if you suffer from PAD. If the index number drops after exercise, this may show that PAD is present. A normal ABI ratio is between 0.9 and 1.4. A value below 0.9 is considered abnormal. This information is not intended to replace advice given to you by your health care provider. Make sure you discuss any questions you have with your health care provider. Document Released: 04/05/2004 Document Revised: 09/07/2015 Document Reviewed: 11/05/2013  2017 Elsevier

## 2016-05-13 ENCOUNTER — Ambulatory Visit: Payer: Medicaid Other | Admitting: Family Medicine

## 2016-05-14 ENCOUNTER — Other Ambulatory Visit: Payer: Self-pay

## 2016-05-14 DIAGNOSIS — I10 Essential (primary) hypertension: Secondary | ICD-10-CM

## 2016-05-14 MED ORDER — FUROSEMIDE 20 MG PO TABS: 20.0000 mg | ORAL_TABLET | Freq: Every day | ORAL | 2 refills | Status: DC

## 2016-05-20 ENCOUNTER — Telehealth: Payer: Self-pay | Admitting: *Deleted

## 2016-05-20 NOTE — Telephone Encounter (Signed)
Spoke with patient. Patient verbalized understanding to arrive at [place] on 05/21/16 to [facility] for [facility] stress test and of preprocedural instructions.

## 2016-05-21 ENCOUNTER — Telehealth: Payer: Self-pay | Admitting: *Deleted

## 2016-05-21 ENCOUNTER — Ambulatory Visit: Payer: Medicaid Other

## 2016-05-21 NOTE — Telephone Encounter (Signed)
Number says "Call cannot be completed as dialed." Attempted several times and received same message.  Patient cancelled [facility] for today. Attempted to call patient to reschedule.

## 2016-05-23 ENCOUNTER — Ambulatory Visit: Payer: Medicaid Other | Admitting: Family Medicine

## 2016-05-23 NOTE — Telephone Encounter (Signed)
Phone number says "Call cannot be completed as dialed, please try again later." No other numbers and no DPR listed.  Was calling to see if patient wanted to reschedule myoview.

## 2016-05-24 ENCOUNTER — Encounter: Payer: Self-pay | Admitting: *Deleted

## 2016-05-24 NOTE — Telephone Encounter (Signed)
When calling number it states "Call can not be completed as dialed". Will mail letter to patient about rescheduling stress test.

## 2016-05-30 ENCOUNTER — Other Ambulatory Visit: Payer: Self-pay | Admitting: Physician Assistant

## 2016-05-30 DIAGNOSIS — I739 Peripheral vascular disease, unspecified: Secondary | ICD-10-CM

## 2016-06-03 ENCOUNTER — Encounter: Payer: Self-pay | Admitting: *Deleted

## 2016-06-03 ENCOUNTER — Other Ambulatory Visit: Payer: Self-pay | Admitting: Cardiovascular Disease

## 2016-06-03 ENCOUNTER — Other Ambulatory Visit: Payer: Self-pay | Admitting: Family Medicine

## 2016-06-03 ENCOUNTER — Ambulatory Visit: Payer: Self-pay | Admitting: Physician Assistant

## 2016-06-03 DIAGNOSIS — E038 Other specified hypothyroidism: Secondary | ICD-10-CM

## 2016-06-03 NOTE — Telephone Encounter (Signed)
Last cardiology note reviewed; this matches dose; last tsh reviewed rx approved

## 2016-06-04 ENCOUNTER — Encounter: Payer: Self-pay | Admitting: Physician Assistant

## 2016-07-12 ENCOUNTER — Other Ambulatory Visit: Payer: Self-pay

## 2016-07-12 MED ORDER — FUROSEMIDE 20 MG PO TABS: 20.0000 mg | ORAL_TABLET | Freq: Two times a day (BID) | ORAL | 3 refills | Status: DC

## 2016-07-18 ENCOUNTER — Other Ambulatory Visit: Payer: Self-pay

## 2016-07-18 MED ORDER — GABAPENTIN 300 MG PO CAPS: 300.0000 mg | ORAL_CAPSULE | Freq: Three times a day (TID) | ORAL | 0 refills | Status: DC

## 2016-07-18 MED ORDER — PAROXETINE HCL 40 MG PO TABS: 40.0000 mg | ORAL_TABLET | ORAL | 0 refills | Status: DC

## 2016-07-18 NOTE — Telephone Encounter (Signed)
Please ask patient to schedule an appointment in the next few weeks; thank you Rxs sent as requested

## 2016-07-18 NOTE — Telephone Encounter (Signed)
Patient changed pharmacy and needs 90 day supply.

## 2016-07-22 NOTE — Telephone Encounter (Signed)
Letter sent no phone number on file.

## 2016-07-23 ENCOUNTER — Encounter: Payer: Self-pay | Admitting: Family Medicine

## 2016-07-23 ENCOUNTER — Ambulatory Visit (INDEPENDENT_AMBULATORY_CARE_PROVIDER_SITE_OTHER): Payer: Medicaid Other | Admitting: Family Medicine

## 2016-07-23 VITALS — BP 118/70 | HR 66 | Temp 97.5°F | Resp 16 | Wt 176.5 lb

## 2016-07-23 DIAGNOSIS — Z720 Tobacco use: Secondary | ICD-10-CM

## 2016-07-23 DIAGNOSIS — Z114 Encounter for screening for human immunodeficiency virus [HIV]: Secondary | ICD-10-CM | POA: Diagnosis not present

## 2016-07-23 DIAGNOSIS — J0101 Acute recurrent maxillary sinusitis: Secondary | ICD-10-CM

## 2016-07-23 DIAGNOSIS — R790 Abnormal level of blood mineral: Secondary | ICD-10-CM

## 2016-07-23 DIAGNOSIS — E038 Other specified hypothyroidism: Secondary | ICD-10-CM

## 2016-07-23 DIAGNOSIS — N289 Disorder of kidney and ureter, unspecified: Secondary | ICD-10-CM | POA: Diagnosis not present

## 2016-07-23 LAB — MAGNESIUM: Magnesium: 2.3 mg/dL (ref 1.5–2.5)

## 2016-07-23 LAB — BASIC METABOLIC PANEL
BUN: 24 mg/dL (ref 7–25)
CHLORIDE: 103 mmol/L (ref 98–110)
CO2: 30 mmol/L (ref 20–31)
Calcium: 9.8 mg/dL (ref 8.6–10.2)
Creat: 2.07 mg/dL — ABNORMAL HIGH (ref 0.50–1.10)
Glucose, Bld: 73 mg/dL (ref 65–99)
POTASSIUM: 5.1 mmol/L (ref 3.5–5.3)
SODIUM: 140 mmol/L (ref 135–146)

## 2016-07-23 MED ORDER — AMOXICILLIN-POT CLAVULANATE 875-125 MG PO TABS: 1.0000 | ORAL_TABLET | Freq: Two times a day (BID) | ORAL | 0 refills | Status: AC

## 2016-07-23 NOTE — Progress Notes (Signed)
BP 118/70   Pulse 66   Temp 97.5 F (36.4 C) (Oral)   Resp 16   Wt 176 lb 8 oz (80.1 kg)   SpO2 97%   BMI 28.49 kg/m    Subjective:    Patient ID: Angelica Herrera, female    DOB: 1970/10/17, 46 y.o.   MRN: [id number]  HPI: Angelica Herrera is a 46 y.o. female  Chief Complaint  Patient presents with  . Medication Refill  . URI    wankts to see about getting antibiotic, sypmtoms include: chest congestion, sinus pressure and cough onset months   Patient has been seeing cardiologist; BP dropped; doing stress test, not yet; also having studies on her legs; K+ and Mg2+ also dropped; we reviewed her labs which showed an increase in Cr; she says they decreased her lasix Going through a separation, for the 9 months; she is safe Feels like she has no energy; drinking plenty of water She had an abnormal thyroid test in October, adjusted, and normal TSH on 05/09/16; was out of thyroid medicine for several days, back on now  Upper respiratory infection; going on for 2 months; hocking up two [employer] of nothing but snot (her words); draining from sinuses and chest; pressure in her face; tunnel in the ears sometimes; blowing out clear stuff; no fevers  Depression screen [facility] 2/9 07/23/2016 01/25/2016 01/25/2016 09/07/2015 06/28/2015  Decreased Interest 0 3 0 0 0  Down, Depressed, Hopeless 1 3 0 0 0  PHQ - 2 Score 1 6 0 0 0  Altered sleeping - 3 - - -  Tired, decreased energy - 3 - - -  Change in appetite - 3 - - -  Feeling bad or failure about yourself  - 3 - - -  Trouble concentrating - 1 - - -  Moving slowly or fidgety/restless - 1 - - -  Suicidal thoughts - 0 - - -  PHQ-9 Score - 20 - - -   Relevant past medical, surgical, family and social history reviewed Past Medical History:  Diagnosis Date  . CAD (coronary artery disease)    a. 02/2012 s/p anterolateral STEMI/Cath/PCI: LM 100 (3.0x15 DES ), LAD sm, severely diseased (PTCA w/ 1.42mm balloon), LCX nl, RI moderate  size, nl, RCA 30-75m, AM/PDA/PLA nl, R->L collats noted; b. cath 08/03/15: widely patent ostLM stent at 10%, mLAD 60%,  mRCA 30%, EF 35-40%, ant wall HK, mildly elevated LVEDP  . CKD (chronic kidney disease), stage IV ([place])   . HTN (hypertension)   . Hyperlipidemia   . Hypothyroidism   . Ischemic cardiomyopathy    a. 02/2012 Echo: EF 40-45%, ant/lat HK, Gr1 DD, Mild MR, PASP 74mmHg.  Marland Kitchen NSVT (nonsustained ventricular tachycardia) ([place])    a. 02/2012 post-MI  . Obesity   . Tobacco abuse    Past Surgical History:  Procedure Laterality Date  . ABDOMINAL HYSTERECTOMY    . CARDIAC CATHETERIZATION  2013   [facility] s/p stent  . CARDIAC CATHETERIZATION Left 08/03/2015   Procedure: Left Heart Cath and Coronary Angiography;  Surgeon: Wellington Hampshire, MD;  Location: [facility];  Service: Cardiovascular;  Laterality: Left;  . DILATION AND CURETTAGE OF UTERUS    . LEFT HEART CATHETERIZATION WITH CORONARY ANGIOGRAM N/A 02/11/2012   Procedure: LEFT HEART CATHETERIZATION WITH CORONARY ANGIOGRAM;  Surgeon: [name], MD;  Location: [facility] CATH [facility];  Service: Cardiovascular;  Laterality: N/A;   Social History  Substance Use Topics  .  Smoking status: Current Every Day Smoker    Packs/day: 1.00    Years: 30.00    Types: Cigarettes  . Smokeless tobacco: Never Used  . Alcohol use Yes  MD note: she is not ready to quit  Interim medical history since last visit reviewed. Allergies and medications reviewed  Review of Systems Per HPI unless specifically indicated above     Objective:    BP 118/70   Pulse 66   Temp 97.5 F (36.4 C) (Oral)   Resp 16   Wt 176 lb 8 oz (80.1 kg)   SpO2 97%   BMI 28.49 kg/m   Wt Readings from Last 3 Encounters:  07/23/16 176 lb 8 oz (80.1 kg)  05/09/16 170 lb (77.1 kg)  01/25/16 173 lb 7 oz (78.7 kg)    Physical Exam  Constitutional: She appears well-developed and well-nourished.  HENT:  Right Ear: Tympanic membrane is not retracted. A middle ear  effusion is present.  Left Ear: Tympanic membrane is retracted. A middle ear effusion is present.  Nose: Mucosal edema and rhinorrhea present. Right sinus exhibits maxillary sinus tenderness and frontal sinus tenderness. Left sinus exhibits maxillary sinus tenderness and frontal sinus tenderness.  Mouth/Throat: Oropharynx is clear and moist and mucous membranes are normal.  Purulent material left side more than right side  Eyes: EOM are normal. No scleral icterus.  Cardiovascular: Normal rate and regular rhythm.   Pulmonary/Chest: Effort normal and breath sounds normal.  Lymphadenopathy:    She has no cervical adenopathy.  Psychiatric: She has a normal mood and affect. Her behavior is normal.   Results for orders placed or performed during the hospital encounter of 64/40/34  Basic metabolic panel  Result Value Ref Range   Sodium 138 135 - 145 mmol/L   Potassium 4.1 3.5 - 5.1 mmol/L   Chloride 104 101 - 111 mmol/L   CO2 24 22 - 32 mmol/L   Glucose, Bld 87 65 - 99 mg/dL   BUN 32 (H) 6 - 20 mg/dL   Creatinine, Ser 1.74 (H) 0.44 - 1.00 mg/dL   Calcium 9.8 8.9 - 10.3 mg/dL   GFR calc non Af Amer 34 (L) >60 mL/min   GFR calc Af Amer 39 (L) >60 mL/min   Anion gap 10 5 - 15  TSH  Result Value Ref Range   TSH 1.352 0.350 - 4.500 uIU/mL  Magnesium  Result Value Ref Range   Magnesium 2.5 (H) 1.7 - 2.4 mg/dL      Assessment & Plan:   Problem List Items Addressed This Visit      Endocrine   Hypothyroidism    Last TSH normal; so glad she is back on her medicine        Other   Tobacco abuse    She is not ready to quit right now; I told her I am here to help her quit if/when ready       Other Visit Diagnoses    Acute recurrent maxillary sinusitis    -  Primary   Relevant Medications   amoxicillin-clavulanate (AUGMENTIN) 875-125 MG tablet   Screening for HIV (human immunodeficiency virus)       Relevant Orders   HIV antibody (with reflex)   Abnormal blood level of magnesium        Relevant Orders   Magnesium   Renal insufficiency       Relevant Orders   Basic Metabolic Panel (BMET)       Follow up plan:  No Follow-up on file.  An after-visit summary was printed and given to the patient at [facility].  Please see the patient instructions which may contain other information and recommendations beyond what is mentioned above in the assessment and plan.  Meds ordered this encounter  Medications  . amoxicillin-clavulanate (AUGMENTIN) 875-125 MG tablet    Sig: Take 1 tablet by mouth 2 (two) times daily.    Dispense:  20 tablet    Refill:  0    Orders Placed This Encounter  Procedures  . HIV antibody (with reflex)  . Basic Metabolic Panel (BMET)  . Magnesium

## 2016-07-23 NOTE — Assessment & Plan Note (Signed)
Last TSH normal; so glad she is back on her medicine

## 2016-07-23 NOTE — Assessment & Plan Note (Signed)
She is not ready to quit right now; I told her I am here to help her quit if/when ready

## 2016-07-23 NOTE — Patient Instructions (Signed)
I do encourage you to quit smoking Call [phone] to sign up for smoking cessation classes You can call 1-800-QUIT-NOW to talk with a smoking cessation coach  Start the antibiotic Please do eat yogurt daily or take a probiotic daily for the next month or two We want to replace the healthy germs in the gut If you notice foul, watery diarrhea in the next two months, schedule an appointment RIGHT AWAY  We'll get labs

## 2016-07-24 LAB — HIV ANTIBODY (ROUTINE TESTING W REFLEX): HIV: NONREACTIVE

## 2016-07-25 ENCOUNTER — Telehealth: Payer: Self-pay

## 2016-07-25 NOTE — Telephone Encounter (Signed)
-----   Message from Arnetha Courser, MD sent at 07/25/2016 12:20 PM EDT ----- Please fax lab results to her kidney doctor. Thank you.

## 2016-07-25 NOTE — Telephone Encounter (Signed)
Need pt kidney doctor information to send fax.Left a voicemail so I can get that information .

## 2016-07-27 ENCOUNTER — Other Ambulatory Visit: Payer: Self-pay | Admitting: Family Medicine

## 2016-07-28 NOTE — Telephone Encounter (Signed)
Should not be out of these (gabapentin and paroxetine); just refilled on 07/18/16; resolve issue with pharmacy please

## 2016-09-17 ENCOUNTER — Other Ambulatory Visit: Payer: Self-pay

## 2016-09-17 MED ORDER — PAROXETINE HCL 40 MG PO TABS: 40.0000 mg | ORAL_TABLET | ORAL | 0 refills | Status: DC

## 2016-09-17 MED ORDER — GABAPENTIN 300 MG PO CAPS: 300.0000 mg | ORAL_CAPSULE | Freq: Three times a day (TID) | ORAL | 0 refills | Status: DC

## 2016-09-17 NOTE — Telephone Encounter (Signed)
Labs are up to date. No early prescribing. As I can see no notes on not wanting them refill.

## 2016-10-22 ENCOUNTER — Other Ambulatory Visit: Payer: Self-pay | Admitting: Family Medicine

## 2017-01-22 IMAGING — CR DG CHEST 2V
1 series · 2 of 2 positions shown · non-contrast
Comparison: PA and lateral chest x-ray [DATE]

CLINICAL DATA: Preoperative exam prior to catheterization
procedure. , acute MI, current smoker.

EXAM:
CHEST  2 VIEW

[Series 1: dg chest 2 view · 0.14mm/px · 2 of 2 slices shown]
[im 1/2]
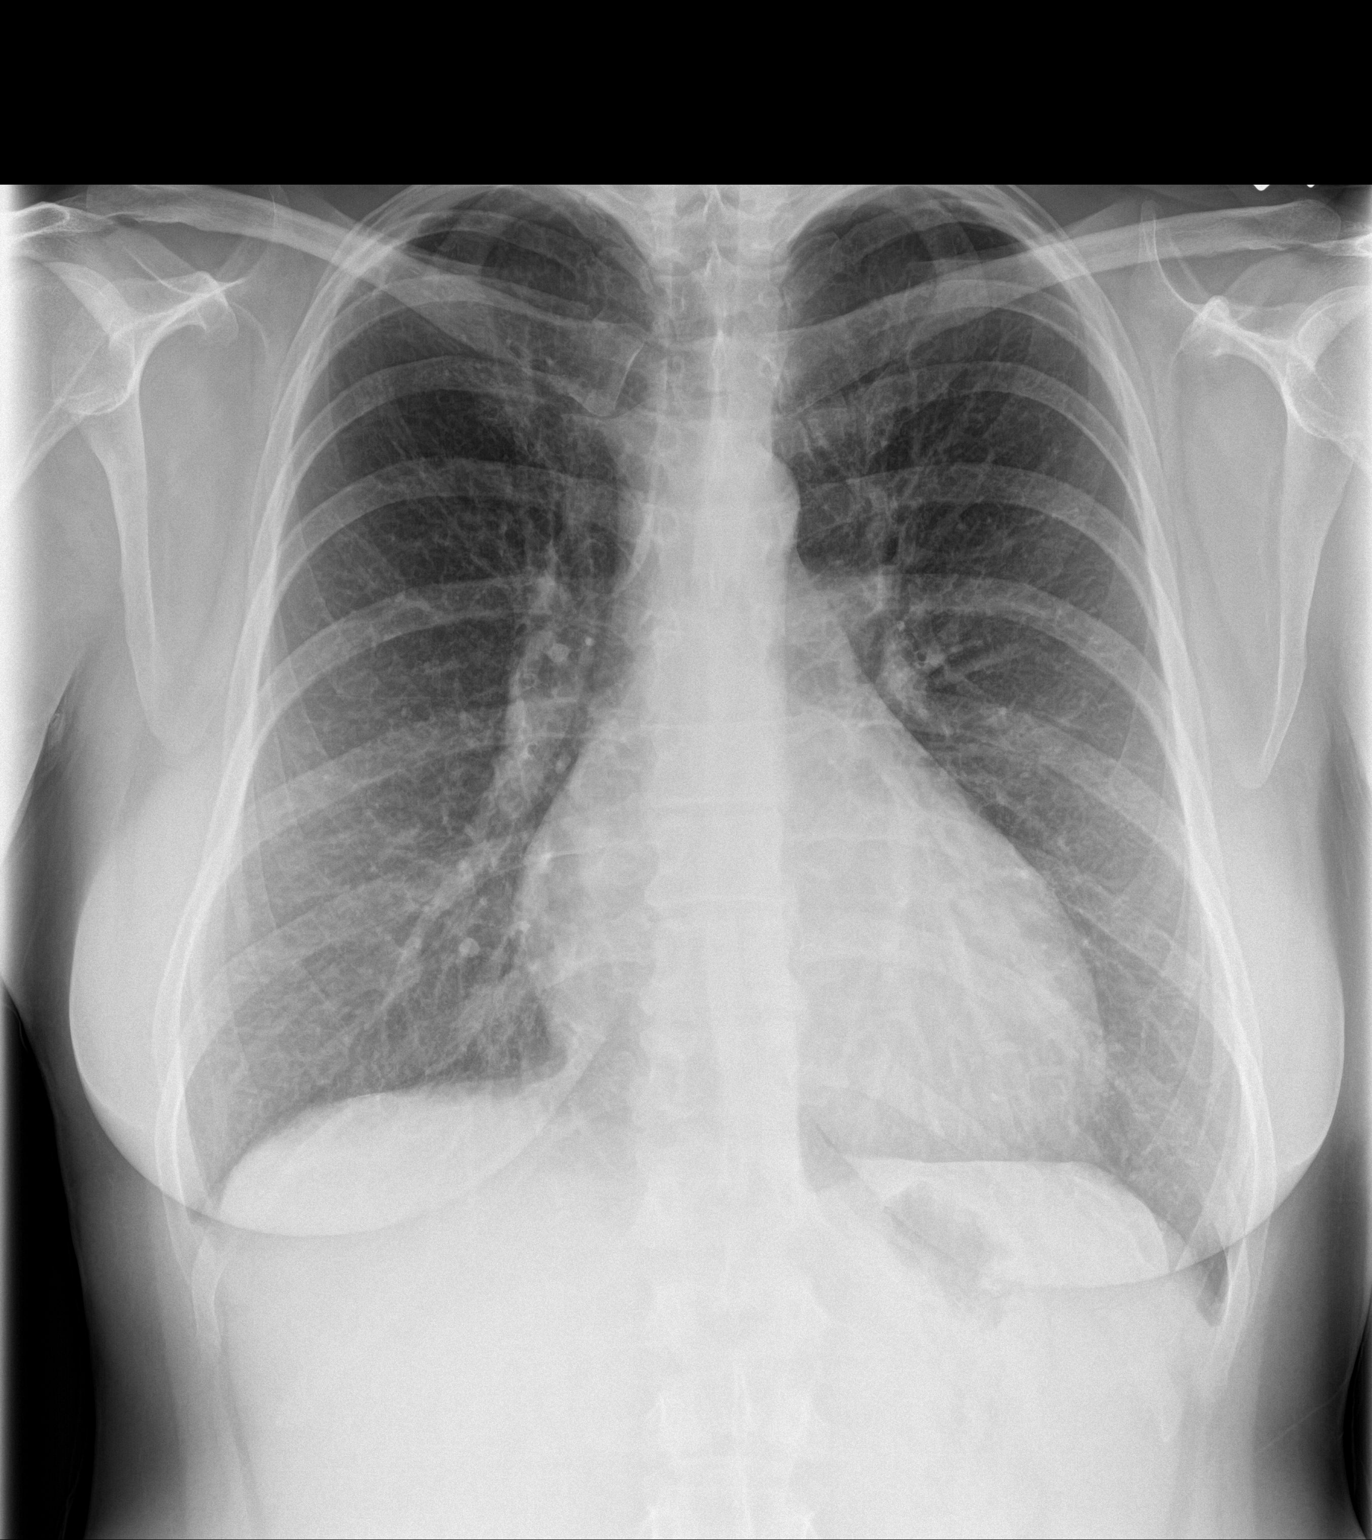
[im 2/2]
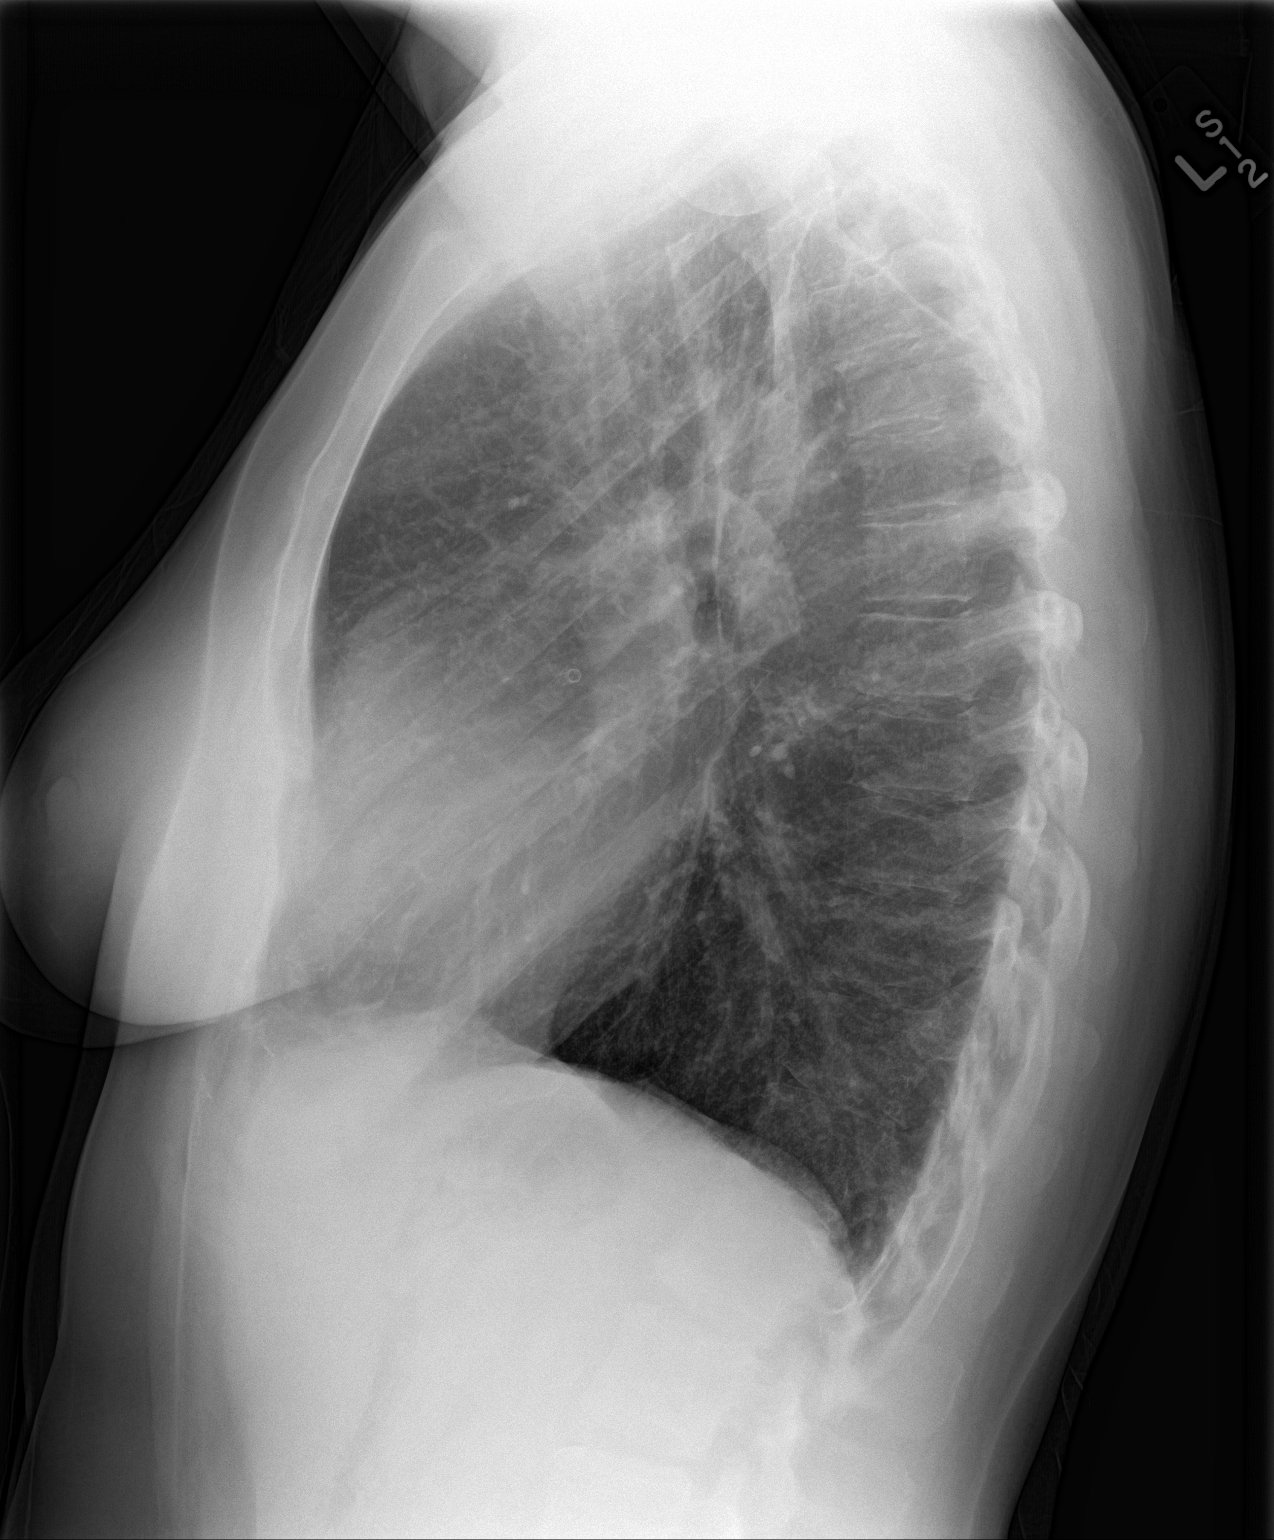

[2 of 2 positions shown; findings below may reference images not displayed]

FINDINGS: The lungs are mildly hyperinflated. The interstitial markings are
coarse but have become less conspicuous since the previous study.
There is no pleural effusion or pneumothorax. The cardiac silhouette
is mildly enlarged but less prominent than on the previous study.
The pulmonary vascularity is not significantly engorged. The
mediastinum is normal in width. The bony thorax exhibits no acute
abnormality.
IMPRESSION: COPD.  Mild cardiomegaly.  No evidence of CHF or pneumonia.

## 2017-02-10 ENCOUNTER — Telehealth: Payer: Self-pay | Admitting: Cardiovascular Disease

## 2017-02-10 NOTE — Telephone Encounter (Signed)
Patient has not been seen since January. Will await for patient to see provider tomorrow for re-evaluation.

## 2017-02-10 NOTE — Telephone Encounter (Signed)
PT cancelled previous appt in Feb PT calling to schedule appt now Seeing [facility] 10/30 due to weakness in legs PT was to have a myoview but never completed

## 2017-02-11 ENCOUNTER — Ambulatory Visit (INDEPENDENT_AMBULATORY_CARE_PROVIDER_SITE_OTHER): Payer: Medicaid Other | Admitting: Physician Assistant

## 2017-02-11 ENCOUNTER — Encounter: Payer: Self-pay | Admitting: Physician Assistant

## 2017-02-11 ENCOUNTER — Other Ambulatory Visit
Admission: RE | Admit: 2017-02-11 | Discharge: 2017-02-11 | Disposition: A | Discharge status: Home / Self Care | Payer: Medicaid Other | Source: Ambulatory Visit | Attending: Physician Assistant | Admitting: Physician Assistant

## 2017-02-11 VITALS — BP 102/68 | HR 69 | Ht 67.0 in | Wt 165.8 lb

## 2017-02-11 DIAGNOSIS — I2089 Other forms of angina pectoris: Secondary | ICD-10-CM

## 2017-02-11 DIAGNOSIS — I208 Other forms of angina pectoris: Secondary | ICD-10-CM | POA: Diagnosis present

## 2017-02-11 DIAGNOSIS — I2510 Atherosclerotic heart disease of native coronary artery without angina pectoris: Secondary | ICD-10-CM

## 2017-02-11 DIAGNOSIS — M79604 Pain in right leg: Secondary | ICD-10-CM

## 2017-02-11 DIAGNOSIS — M79605 Pain in left leg: Secondary | ICD-10-CM

## 2017-02-11 DIAGNOSIS — Z01818 Encounter for other preprocedural examination: Secondary | ICD-10-CM

## 2017-02-11 DIAGNOSIS — N189 Chronic kidney disease, unspecified: Secondary | ICD-10-CM

## 2017-02-11 DIAGNOSIS — I255 Ischemic cardiomyopathy: Secondary | ICD-10-CM

## 2017-02-11 DIAGNOSIS — R0602 Shortness of breath: Secondary | ICD-10-CM

## 2017-02-11 DIAGNOSIS — N179 Acute kidney failure, unspecified: Secondary | ICD-10-CM | POA: Diagnosis not present

## 2017-02-11 DIAGNOSIS — E782 Mixed hyperlipidemia: Secondary | ICD-10-CM | POA: Diagnosis not present

## 2017-02-11 DIAGNOSIS — I5022 Chronic systolic (congestive) heart failure: Secondary | ICD-10-CM

## 2017-02-11 LAB — CBC WITH DIFFERENTIAL/PLATELET
BASOS PCT: 1 %
Basophils Absolute: 0 10*3/uL (ref 0–0.1)
EOS ABS: 0.1 10*3/uL (ref 0–0.7)
Eosinophils Relative: 2 %
HCT: 43.6 % (ref 35.0–47.0)
HEMOGLOBIN: 14.3 g/dL (ref 12.0–16.0)
Lymphocytes Relative: 24 %
Lymphs Abs: 1.5 10*3/uL (ref 1.0–3.6)
MCH: 30.5 pg (ref 26.0–34.0)
MCHC: 32.8 g/dL (ref 32.0–36.0)
MCV: 93.1 fL (ref 80.0–100.0)
Monocytes Absolute: 0.4 10*3/uL (ref 0.2–0.9)
Monocytes Relative: 7 %
NEUTROS PCT: 66 %
Neutro Abs: 4.2 10*3/uL (ref 1.4–6.5)
Platelets: 151 10*3/uL (ref 150–440)
RBC: 4.69 MIL/uL (ref 3.80–5.20)
RDW: 13.6 % (ref 11.5–14.5)
WBC: 6.4 10*3/uL (ref 3.6–11.0)

## 2017-02-11 LAB — BASIC METABOLIC PANEL
Anion gap: 8 (ref 5–15)
BUN: 23 mg/dL — ABNORMAL HIGH (ref 6–20)
CALCIUM: 9 mg/dL (ref 8.9–10.3)
CHLORIDE: 108 mmol/L (ref 101–111)
CO2: 24 mmol/L (ref 22–32)
CREATININE: 1.35 mg/dL — AB (ref 0.44–1.00)
GFR calc non Af Amer: 46 mL/min — ABNORMAL LOW (ref >60)
GFR, EST AFRICAN AMERICAN: 53 mL/min — AB (ref >60)
Glucose, Bld: 82 mg/dL (ref 65–99)
Potassium: 3.9 mmol/L (ref 3.5–5.1)
SODIUM: 140 mmol/L (ref 135–145)

## 2017-02-11 LAB — TSH: TSH: 3.551 u[IU]/mL (ref 0.350–4.500)

## 2017-02-11 LAB — MAGNESIUM: MAGNESIUM: 2.2 mg/dL (ref 1.7–2.4)

## 2017-02-11 MED ORDER — CARVEDILOL 6.25 MG PO TABS: 6.2500 mg | ORAL_TABLET | Freq: Two times a day (BID) | ORAL | 3 refills | Status: DC

## 2017-02-11 NOTE — Progress Notes (Signed)
Cardiology Office Note Date:  02/11/2017  Patient ID:  Angelica Herrera, Angelica Herrera [birth year], MRN [id number] PCP:  Angelica Courser, MD  Cardiologist:  Dr. Fletcher Anon, MD    Chief Complaint: Follow-up CAD/ICM  History of Present Illness: Angelica Herrera is a 46 y.o. female with history of CAD s/p anterolateral ST elevation MI in 01/2012 s/p PCI/DES to left main as below, ischemic cardiomyopathy, CKD stage IV, NSVT, HTN, HLD, hypothyroidism, and obesity who presents for follow up of her CAD and ischemic cardia myopathy.   She was lost to follow up from 2013 until she re-established with Angelica Herrera on 07/18/2015. Back in October, 2013 she presented to the hospital with an acute anterolateral ST elevation MI. She underwent emergent cardiac catheterization by Dr. Burt Herrera which showed occlusion of the left main coronary artery. There was significant difficulty engaging the left main, though she ultimately underwent successful PCI/DES. She also had PTCA done to the LAD due to embolization from the left main. Post procedure she had significant hemodynamic instablity with VT and hypotension. She was noted to have severe MR initially, however subsequent echocardiogram showed an EF of 45% with no significant MR. In her interim loss to follow up she was followed by Dr. Lavera Herrera and had not had any hospitalizations. At her re-establishment visit with Angelica Herrera in 07/2015, she was noted to have ongoing tobacco abuse and had not been able to afford some of her medications, thus was not on a statin for that reason. She had noted an episode of chest pain in the setting of elevated BP prior to that visit and was started on clonidine by Dr. Rolly Salter (her nephrologist), but she reported significant drop in BP with clonidine and she decreased her dose to 0.1 mg daily. She was also noted to not be on an ACEi or ARB at that time, for unclear reasons. Because of her episode of chest pain and prior left main stenting she underwent  nuclear stress testing on 07/28/15 that showed mild anterior wall ischemia in the mid to apical region. EF was estimated at 33% with anteroseptal wall hypokinesis. This was overall a moderate risk study. She attempted Bruce protocol but was unable to achieve target HR and was changed to [employer]. Because of her abnormal stress test she underwent cardiac cath on 08/03/15 that showed a widely patent ostial left main stent with moderate mid LAD stenosis and occluded 2nd Diag with faint collaterals. The RCA was very large with mild stenosis. She was noted to have moderately reduced LV systolic function with an EF of 35-40% with significant anterior wall hypokinesis. Mildly elevated LVEDP. It was recommended that she continue medical therapy for her CAD and chronic systolic CHF. In follow up on 09/05/15 she had not had any further chest pain with continued medication compliance issues, only taking ASA 3-4 times weekly. BP had been running in the 500 systolic range at home. She continued to smoke tobacco. Her clonidine was tapered off 2/2 fatigue. Echocardiogram was checked, in 09/2015, given her history of mitral regurgitation that showed an EF of 35-40%, hypokinesis of the anterior and anteroseptal myocardium, GR1DD, calcified mitral annulus with mild mitral regurgitation, RV systolic function was normal, PASP normal. She was seen by Angelica Herrera on 11/07/15 with increased stress noted 2/2 separation. BP was noted to be 140/90 at that time. Her lisinopril was increased to 20 mg daily. She was continued on Coreg 25 mg bid and Lasix 20 mg bid. She called the office in  03/2016 with complaints of positional dizziness with reported soft BP into the 93X systolic. Over the phone, her Coreg was decreased to 12.5 mg bid and Lasix 20 mg was changed from bid to daily. In follow up on 05/09/2016, she reported never making the above medication changes. She continued to note positional dizziness along with bilateral leg pain with ambulation.  She was drinking mostly [facility]. Her Coreg was decreased to 12.5 mg bid and lisinopril was decreased to 10 mg daily. She was continued on Lasix 20 mg bid. Labs showed the patient was mildly dehydrated and she was advised to decrease her Lasix to 20 mg daily. TSH was normal. Recheck bmet was advised 1 week later, which the patient did not complete. Nuclear stress test, lower extremity ultrasound and ABIs were ordered, but the patient did not follow through with these tests. Labs were most recently drawn by her PCP in 07/2016 that showed a SCr of 2.07 (baseline ~ 1.2-1.3), K+ 5.1, and magnesium 2.3. Cardiology felt her worsening renal function was not 2/2 diuretics given her normal BUN and she was advised to follow up with her nephrologist.   She comes in today with multiple complaints.  She continues to note generalized fatigue, intermittent left-sided chest pain, bilateral lower extremity pain with ambulation and at rest, and myalgias.  She reports the symptoms are essentially unchanged from her last visit with [place] back in January, 2018.  Her left-sided chest pain is exertional, lasts for 1-2 minutes and self resolves.  Pain does not feel similar to prior MI.  Pain has not been severe enough to require sublingual nitroglycerin.  There is no associated shortness of breath, diaphoresis, nausea, vomiting, dizziness, presyncope, or syncope.  Pain occurs several times per week.  She is currently chest pain-free.  She also notes bilateral lower extremity pain with activities such as sweeping or dusting the house.  Pain is located along the calf muscles as well as along the shins.  She denies any orthopnea, early satiety, lower extremity swelling, abdominal distention, PND, or cough.  No fevers or chills.  Weight is down 5 pounds from last clinic visit.  She reports compliance with her medications including dual antiplatelet therapy without missing any doses.  She continues to smoke 1/4-1/2 pack of cigarettes daily.   She continues to drink [facility] and has one in the office with her today.   Past Medical History:  Diagnosis Date  . CAD (coronary artery disease)    a. 02/2012 s/p anterolateral STEMI/Cath/PCI: LM 100 (3.0x15 DES ), LAD sm, severely diseased (PTCA w/ 1.86m balloon), LCX nl, RI moderate size, nl, RCA 30-479mAM/PDA/PLA nl, R->L collats noted; b. cath 08/03/15: widely patent ostLM stent at 10%, mLAD 60%,  mRCA 30%, EF 35-40%, ant wall HK, mildly elevated LVEDP  . CKD (chronic kidney disease), stage IV ([facility]  . HTN (hypertension)   . Hyperlipidemia   . Hypothyroidism   . Ischemic cardiomyopathy    a. 02/2012 Echo: EF 40-45%, ant/lat HK, Gr1 DD, Mild MR, PASP 4366m.  . NMarland KitchenVT (nonsustained ventricular tachycardia) ([facility]  a. 02/2012 post-MI  . Obesity   . Tobacco abuse     Past Surgical History:  Procedure Laterality Date  . ABDOMINAL HYSTERECTOMY    . CARDIAC CATHETERIZATION  2013   [facility] s/p stent  . CARDIAC CATHETERIZATION Left 08/03/2015   Procedure: Left Heart Cath and Coronary Angiography;  Surgeon: MuhWellington HampshireD;  [facility];  Service:  Cardiovascular;  Laterality: Left;  . DILATION AND CURETTAGE OF UTERUS    . LEFT HEART CATHETERIZATION WITH CORONARY ANGIOGRAM N/A 02/11/2012   Procedure: LEFT HEART CATHETERIZATION WITH CORONARY ANGIOGRAM;  Surgeon: [name], MD;  Location: [facility] CATH LAB;  Service: Cardiovascular;  Laterality: N/A;    Current Meds  Medication Sig  . aspirin 81 MG tablet Take 1 tablet (81 mg total) by mouth daily.  Marland Kitchen atorvastatin (LIPITOR) 40 MG tablet TAKE 1 TABLET (40 MG TOTAL) BY MOUTH DAILY.  . carvedilol (COREG) 12.5 MG tablet Take 1 tablet (12.5 mg total) by mouth 2 (two) times daily.  . clopidogrel (PLAVIX) 75 MG tablet TAKE 1 TABLET (75 MG TOTAL) BY MOUTH ONCE A DAY  . furosemide (LASIX) 20 MG tablet Take 1 tablet (20 mg total) by mouth 2 (two) times daily. (Patient taking differently: Take 20 mg by mouth daily. )  .  gabapentin (NEURONTIN) 300 MG capsule TAKE (1) CAPSULE BY MOUTH THREE TIMES A DAY  . levothyroxine (SYNTHROID, LEVOTHROID) 112 MCG tablet Take 1 tablet (112 mcg total) by mouth daily before breakfast.  . lisinopril (PRINIVIL,ZESTRIL) 10 MG tablet Take 1 tablet (10 mg total) by mouth daily.  . magnesium oxide (MAG-OX) 400 MG tablet Take 1 tablet (400 mg total) by mouth daily.  . nitroGLYCERIN (NITROSTAT) 0.4 MG SL tablet Place 1 tablet (0.4 mg total) under the tongue every 5 (five) minutes x 3 doses as needed for chest pain.  Marland Kitchen PARoxetine (PAXIL) 40 MG tablet TAKE (1) TABLET BY MOUTH EVERY MORNING  . potassium chloride SA (K-DUR,KLOR-CON) 20 MEQ tablet Take 20 mEq (1 tablet) by mouth twice a day for 3 days, then take 62mq (1 tablet) once a day. (Patient taking differently: Take 20 mEq by mouth daily. Take 20 mEq (1 tablet) by mouth twice a day for 3 days, then take 278m (1 tablet) once a day.)    Allergies:   Patient has no known allergies.   Social History:  The patient  reports that she has been smoking Cigarettes.  She has a 30.00 pack-year smoking history. She has never used smokeless tobacco. She reports that she drinks alcohol. She reports that she does not use drugs.   Family History:  The patient's family history includes AAA (abdominal aortic aneurysm) in her father; Cancer in her paternal grandfather; Diabetes in her mother; Hyperlipidemia in her mother; Hypertension in her brother, brother, daughter, father, mother, sister, and sister; Kidney disease in her mother; Supraventricular tachycardia in her sister; Thyroid disease in her father.  ROS:   Review of Systems  Constitutional: Positive for malaise/fatigue. Negative for chills, diaphoresis, fever and weight loss.  HENT: Negative for congestion.   Eyes: Negative for discharge and redness.  Respiratory: Negative for cough, hemoptysis, sputum production, shortness of breath and wheezing.   Cardiovascular: Positive for chest pain and  claudication. Negative for palpitations, orthopnea, leg swelling and PND.  Gastrointestinal: Negative for abdominal pain, blood in stool, heartburn, melena, nausea and vomiting.  Genitourinary: Negative for hematuria.  Musculoskeletal: Positive for back pain, joint pain, myalgias and neck pain. Negative for falls.  Skin: Negative for rash.  Neurological: Positive for weakness. Negative for dizziness, tingling, tremors, sensory change, speech change, focal weakness and loss of consciousness.  Endo/Heme/Allergies: Does not bruise/bleed easily.  Psychiatric/Behavioral: Negative for substance abuse. The patient is not nervous/anxious.   All other systems reviewed and are negative.    PHYSICAL EXAM:  VS:  BP 102/68 (BP Location: Left Arm, Patient Position:  Sitting, Cuff Size: Normal)   Pulse 69   Ht 5' 7"  (1.702 m)   Wt 165 lb 12 oz (75.2 kg)   BMI 25.96 kg/m  BMI: Body mass index is 25.96 kg/m.  Physical Exam  Constitutional: She is oriented to person, place, and time. She appears well-developed and well-nourished.  Tobacco odor noted  HENT:  Head: Normocephalic and atraumatic.  Eyes: Right eye exhibits no discharge. Left eye exhibits no discharge.  Neck: Normal range of motion. No JVD present.  Cardiovascular: Normal rate, regular rhythm, S1 normal, S2 normal and normal heart sounds.  Exam reveals no distant heart sounds, no friction rub, no midsystolic click and no opening snap.   No murmur heard. Pulses:      Posterior tibial pulses are 1+ on the right side, and 1+ on the left side.  Pulmonary/Chest: Effort normal and breath sounds normal. No respiratory distress. She has no decreased breath sounds. She has no wheezes. She has no rales. She exhibits no tenderness.  Abdominal: Soft. She exhibits no distension. There is no tenderness.  Musculoskeletal: She exhibits no edema.  Neurological: She is alert and oriented to person, place, and time.  Skin: Skin is warm and dry. No cyanosis.  Nails show no clubbing.  Psychiatric: She has a normal mood and affect. Her speech is normal and behavior is normal. Judgment and thought content normal.    EKG:  Was ordered and interpreted by me today. Shows NSR, 69 bpm, left axis deviation, right atrial enlargement, left anterior fascicular block, poor R wave progression, TWI aVL, nonspecific IVCD  Recent Labs: 05/09/2016: TSH 1.352 07/23/2016: BUN 24; Creat 2.07; Magnesium 2.3; Potassium 5.1; Sodium 140  No results found for requested labs within last 8760 hours.   CrCl cannot be calculated (Patient's most recent lab result is older than the maximum 21 days allowed.).   Wt Readings from Last 3 Encounters:  02/11/17 165 lb 12 oz (75.2 kg)  07/23/16 176 lb 8 oz (80.1 kg)  05/09/16 170 lb (77.1 kg)     Other studies reviewed: Additional studies/records reviewed today include: summarized above  ASSESSMENT AND PLAN:  1. Stable angina/CAD in native coronary artery: Currently chest pain-free.  Symptoms of chest pain are unchanged from January, 2018 visit.  Pain does not feel similar to prior MI.  Patient has not missed any doses of dual antiplatelet therapy.  Will again reorder Lexiscan Myoview to evaluate for high risk ischemia.  Blood pressure currently precludes addition of isosorbide mononitrate.  Continue carvedilol with decreased dose to 6.25 mg twice daily given mildly soft blood pressure and history of orthostatic hypotension.  Has sublingual nitroglycerin for as needed usage.  Continue dual antiplatelet therapy with ASA 81 mg and Plavix 75 mg daily.  Continue risk factor modification with recommendation to stop tobacco abuse, continue atorvastatin, and possible enrollment in cardiac/pulmonary rehabilitation.  2. Chronic systolic CHF/ICM: She does not appear grossly volume overloaded at this time.  Continue carvedilol at lower dose of 6.25 mg twice daily given mildly soft blood pressure and history of orthostatic hypotension.  Continue  lisinopril 10 mg daily as well as Lasix 20 mg daily.  Not on spironolactone given CKD stage III-IV.  CHF education advised.  Check echocardiogram.  3. Leg pain: Symptoms appear mostly atypical though patient does have mildly diminished posterior tibialis pulses bilaterally and has significant risk factors for PAD including ongoing tobacco abuse.  Will again schedule patient for lower extremity arterial ultrasound and ABIs.  If  found to be abnormal recommend PV consultation.  Aggressive risk factor modification advised.  4. Acute on CKD stage III-IV: Most recent being met from 07/2016 showed serum creatinine 2.07 with a baseline of 1.2-1.3.  Patient never followed up with her nephrologist.  Check bmet today.  Adjustment of medications pending these results.  Advised the patient today that she needs to contact her nephrologist for follow-up appointment.  Patient agrees to contact her nephrologist.  5. Fatigue/SOB: Likely multifactorial including patient's chronic systolic CHF/ICM, CAD, possible COPD with ongoing tobacco abuse, obesity, and deconditioning.  Initial noninvasive ischemic workup as above to include nuclear stress testing and echocardiogram.  Patient may benefit from referral to pulmonology for PFTs.  I will defer this to patient's PCP.  Again, patient would likely benefit from cardiac/pulmonary rehab.  Check tsh, bmet, cbc, and magnesium.  Cannot rule out some component of depression as well.  6. HLD: LDL from 1 year ago noted to be at goal at 31.  Continue atorvastatin.  Recommend fasting lipid and liver function at follow-up.  7. Tobacco abuse: Cessation advised.   Disposition: F/u with me in 4-6 weeks.  Current medicines are reviewed at length with the patient today.  The patient did not have any concerns regarding medicines.  Melvern Banker PA-C 02/11/2017 1:47 PM     [address], [place] [address] [phone]

## 2017-02-11 NOTE — Patient Instructions (Addendum)
Medication Instructions:  Your physician has recommended you make the following change in your medication:  1- DECREASE Carvedilol to 6.25 mg by mouth two times a day.   Labwork: Your physician recommends that you return for lab work in: TODAY (CBC, BMP, TSH, Mg) - Please go to the [facility]. You will check in at the front desk to the right as you walk into the atrium. Valet Parking is offered if needed.   URINE PREGNANCY TEST - Your physician recommends that you return for lab work ON February 14, 2017 PRIOR TO STRESS TEST AT [place].    Testing/Procedures: Your physician has requested that you have an echocardiogram. Echocardiography is a painless test that uses sound waves to create images of your heart. It provides your doctor with information about the size and shape of your heart and how well your heart's chambers and valves are working. This procedure takes approximately one hour. There are no restrictions for this procedure.  Your physician has requested that you have a lower extremity arterial doppler- During this test, ultrasound is used to evaluate arterial blood flow in the legs. Allow approximately one hour for this exam.   Your physician has requested that you have an ankle brachial index (ABI). During this test an ultrasound and blood pressure cuff are used to evaluate the arteries that supply the arms and legs with blood. Allow thirty minutes for this exam. There are no restrictions or special instructions.   Your physician has requested that you have a lexiscan myoview. For further information please visit [email or web address]. Please follow instruction sheet, as given.  [place]  Your caregiver has ordered a Stress Test with nuclear imaging. The purpose of this test is to evaluate the blood supply to your heart muscle. This procedure is referred to as a "Non-Invasive Stress Test." This is because other than having an IV started in your vein, nothing is  inserted or "invades" your body. Cardiac stress tests are done to find areas of poor blood flow to the heart by determining the extent of coronary artery disease (CAD). Some patients exercise on a treadmill, which naturally increases the blood flow to your heart, while others who are  unable to walk on a treadmill due to physical limitations have a pharmacologic/chemical stress agent called Lexiscan . This medicine will mimic walking on a treadmill by temporarily increasing your coronary blood flow.   Please note: these test may take anywhere between 2-4 hours to complete  PLEASE REPORT TO [place] AT THE FIRST DESK WILL DIRECT YOU WHERE TO GO  Date of Procedure:____11/2/18________  Arrival Time for Procedure:_________07:45 AM________  Instructions regarding medication:   _X_:  Hold betablocker( CARVEDILOL) THE night before procedure and morning of procedure  _X_:  Hold other medications as follows:______FUROSEMIDE______________________  PLEASE NOTIFY THE OFFICE AT LEAST 24 HOURS IN ADVANCE IF YOU ARE UNABLE TO KEEP YOUR APPOINTMENT.  [phone] AND  PLEASE NOTIFY NUCLEAR MEDICINE AT [facility] AT LEAST 24 HOURS IN ADVANCE IF YOU ARE UNABLE TO KEEP YOUR APPOINTMENT. [phone]  How to prepare for your Myoview test:  1. Do not eat or drink after midnight 2. No caffeine for 24 hours prior to test 3. No smoking 24 hours prior to test. 4. Your medication may be taken with water.  If your doctor stopped a medication because of this test, do not take that medication. 5. Ladies, please do not wear dresses.  Skirts or pants are appropriate.  Please wear a short sleeve shirt. 6. No perfume, cologne or lotion. 7. Wear comfortable walking shoes. No heels!     Follow-Up: Your physician recommends that you schedule a follow-up appointment in: 4-6 [place].  If you need a refill on your cardiac medications before your next appointment, please call your  pharmacy.   Cardiac Nuclear Scan A cardiac nuclear scan is a test that measures blood flow to the heart when a person is resting and when he or she is exercising. The test looks for problems such as:  Not enough blood reaching a portion of the heart.  The heart muscle not working normally.  You may need this test if:  You have heart disease.  You have had abnormal lab results.  You have had heart surgery or angioplasty.  You have chest pain.  You have shortness of breath.  In this test, a radioactive dye (tracer) is injected into your bloodstream. After the tracer has traveled to your heart, an imaging device is used to measure how much of the tracer is absorbed by or distributed to various areas of your heart. This procedure is usually done at a hospital and takes 2-4 hours. Tell a health care provider about:  Any allergies you have.  All medicines you are taking, including vitamins, herbs, eye drops, creams, and over-the-counter medicines.  Any problems you or family members have had with the use of anesthetic medicines.  Any blood disorders you have.  Any surgeries you have had.  Any medical conditions you have.  Whether you are pregnant or may be pregnant. What are the risks? Generally, this is a safe procedure. However, problems may occur, including:  Serious chest pain and heart attack. This is only a risk if the stress portion of the test is done.  Rapid heartbeat.  Sensation of warmth in your chest. This usually passes quickly.  What happens before the procedure?  Ask your health care provider about changing or stopping your regular medicines. This is especially important if you are taking diabetes medicines or blood thinners.  Remove your jewelry on the day of the procedure. What happens during the procedure?  An IV tube will be inserted into one of your veins.  Your health care provider will inject a small amount of radioactive tracer through the  tube.  You will wait for 20-40 minutes while the tracer travels through your bloodstream.  Your heart activity will be monitored with an electrocardiogram (ECG).  You will lie down on an exam table.  Images of your heart will be taken for about 15-20 minutes.  You may be asked to exercise on a treadmill or stationary bike. While you exercise, your heart's activity will be monitored with an ECG, and your blood pressure will be checked. If you are unable to exercise, you may be given a medicine to increase blood flow to parts of your heart.  When blood flow to your heart has peaked, a tracer will again be injected through the IV tube.  After 20-40 minutes, you will get back on the exam table and have more images taken of your heart.  When the procedure is over, your IV tube will be removed. The procedure may vary among health care providers and hospitals. Depending on the type of tracer used, scans may need to be repeated 3-4 hours later. What happens after the procedure?  Unless your health care provider tells you otherwise, you may return to your normal schedule, including diet, activities,  and medicines.  Unless your health care provider tells you otherwise, you may increase your fluid intake. This will help flush the contrast dye from your body. Drink enough fluid to keep your urine clear or pale yellow.  It is up to you to get your test results. Ask your health care provider, or the department that is doing the test, when your results will be ready. Summary  A cardiac nuclear scan measures the blood flow to the heart when a person is resting and when he or she is exercising.  You may need this test if you are at risk for heart disease.  Tell your health care provider if you are pregnant.  Unless your health care provider tells you otherwise, increase your fluid intake. This will help flush the contrast dye from your body. Drink enough fluid to keep your urine clear or pale  yellow. This information is not intended to replace advice given to you by your health care provider. Make sure you discuss any questions you have with your health care provider. Document Released: 04/26/2004 Document Revised: 04/03/2016 Document Reviewed: 03/10/2013 Elsevier Interactive Patient Education  2017 [place].     Echocardiogram An echocardiogram, or echocardiography, uses sound waves (ultrasound) to produce an image of your heart. The echocardiogram is simple, painless, obtained within a short period of time, and offers valuable information to your health care provider. The images from an echocardiogram can provide information such as:  Evidence of coronary artery disease (CAD).  Heart size.  Heart muscle function.  Heart valve function.  Aneurysm detection.  Evidence of a past heart attack.  Fluid buildup around the heart.  Heart muscle thickening.  Assess heart valve function.  Tell a health care provider about:  Any allergies you have.  All medicines you are taking, including vitamins, herbs, eye drops, creams, and over-the-counter medicines.  Any problems you or family members have had with anesthetic medicines.  Any blood disorders you have.  Any surgeries you have had.  Any medical conditions you have.  Whether you are pregnant or may be pregnant. What happens before the procedure? No special preparation is needed. Eat and drink normally. What happens during the procedure?  In order to produce an image of your heart, gel will be applied to your chest and a wand-like tool (transducer) will be moved over your chest. The gel will help transmit the sound waves from the transducer. The sound waves will harmlessly bounce off your heart to allow the heart images to be captured in real-time motion. These images will then be recorded.  You may need an IV to receive a medicine that improves the quality of the pictures. What happens after the  procedure? You may return to your normal schedule including diet, activities, and medicines, unless your health care provider tells you otherwise. This information is not intended to replace advice given to you by your health care provider. Make sure you discuss any questions you have with your health care provider. Document Released: 03/29/2000 Document Revised: 11/18/2015 Document Reviewed: 12/07/2012 Elsevier Interactive Patient Education  2017 [employer].

## 2017-02-13 ENCOUNTER — Other Ambulatory Visit: Payer: Self-pay | Admitting: Physician Assistant

## 2017-02-13 ENCOUNTER — Telehealth: Payer: Self-pay | Admitting: *Deleted

## 2017-02-13 DIAGNOSIS — I2089 Other forms of angina pectoris: Secondary | ICD-10-CM

## 2017-02-13 DIAGNOSIS — I208 Other forms of angina pectoris: Secondary | ICD-10-CM

## 2017-02-13 DIAGNOSIS — R0602 Shortness of breath: Secondary | ICD-10-CM

## 2017-02-13 DIAGNOSIS — M79605 Pain in left leg: Secondary | ICD-10-CM

## 2017-02-13 DIAGNOSIS — M79604 Pain in right leg: Secondary | ICD-10-CM

## 2017-02-13 NOTE — Telephone Encounter (Signed)
Patient called and verbalized understanding of lexiscan preprocedural instructions and appointment date and time.

## 2017-02-14 ENCOUNTER — Ambulatory Visit
Admission: RE | Admit: 2017-02-14 | Discharge: 2017-02-14 | Disposition: A | Discharge status: Home / Self Care | Payer: Medicaid Other | Source: Ambulatory Visit | Attending: Physician Assistant | Admitting: Physician Assistant

## 2017-02-14 DIAGNOSIS — I252 Old myocardial infarction: Secondary | ICD-10-CM | POA: Insufficient documentation

## 2017-02-14 DIAGNOSIS — R55 Syncope and collapse: Secondary | ICD-10-CM | POA: Diagnosis not present

## 2017-02-14 DIAGNOSIS — Z79899 Other long term (current) drug therapy: Secondary | ICD-10-CM

## 2017-02-14 DIAGNOSIS — R0602 Shortness of breath: Secondary | ICD-10-CM | POA: Diagnosis not present

## 2017-02-14 DIAGNOSIS — R42 Dizziness and giddiness: Secondary | ICD-10-CM | POA: Insufficient documentation

## 2017-02-14 DIAGNOSIS — I739 Peripheral vascular disease, unspecified: Secondary | ICD-10-CM | POA: Insufficient documentation

## 2017-02-14 LAB — NM MYOCAR MULTI W/SPECT W/WALL MOTION / EF
CHL CUP MPHR: 173 {beats}/min
CHL CUP NUCLEAR SDS: 1
CHL CUP NUCLEAR SRS: 11
Estimated workload: 1 METS
Exercise duration (min): 0 min
Exercise duration (sec): 0 s
LVDIAVOL: 75 mL (ref 46–106)
LVSYSVOL: 36 mL
Peak HR: 123 {beats}/min
Percent HR: 71 %
Rest HR: 67 {beats}/min
SSS: 9
TID: 0.92

## 2017-02-14 MED ORDER — REGADENOSON 0.4 MG/5ML IV SOLN
0.4000 mg | Freq: Once | INTRAVENOUS | Status: AC
  Administered 2017-02-14: 0.4 mg via INTRAVENOUS

## 2017-02-14 MED ORDER — TECHNETIUM TC 99M TETROFOSMIN IV KIT
13.2390 | PACK | Freq: Once | INTRAVENOUS | Status: AC | PRN
  Administered 2017-02-14: 13.239 via INTRAVENOUS

## 2017-02-14 MED ORDER — TECHNETIUM TC 99M TETROFOSMIN IV KIT
31.0200 | PACK | Freq: Once | INTRAVENOUS | Status: AC | PRN
  Administered 2017-02-14: 31.02 via INTRAVENOUS

## 2017-02-14 MED ORDER — AMINOPHYLLINE 25 MG/ML IV (NUC MED): 75.0000 mg | Freq: Once | INTRAVENOUS | Status: DC

## 2017-02-21 ENCOUNTER — Ambulatory Visit (INDEPENDENT_AMBULATORY_CARE_PROVIDER_SITE_OTHER): Payer: Medicaid Other

## 2017-02-21 ENCOUNTER — Other Ambulatory Visit: Payer: Self-pay

## 2017-02-21 DIAGNOSIS — R0602 Shortness of breath: Secondary | ICD-10-CM

## 2017-02-21 DIAGNOSIS — M79605 Pain in left leg: Secondary | ICD-10-CM

## 2017-02-21 DIAGNOSIS — I208 Other forms of angina pectoris: Secondary | ICD-10-CM | POA: Diagnosis not present

## 2017-02-21 DIAGNOSIS — M79604 Pain in right leg: Secondary | ICD-10-CM

## 2017-02-27 ENCOUNTER — Other Ambulatory Visit: Payer: Self-pay | Admitting: Cardiovascular Disease

## 2017-02-28 ENCOUNTER — Other Ambulatory Visit: Payer: Self-pay

## 2017-02-28 MED ORDER — PAROXETINE HCL 40 MG PO TABS: ORAL_TABLET | ORAL | 1 refills | Status: DC

## 2017-03-01 ENCOUNTER — Other Ambulatory Visit: Payer: Self-pay | Admitting: Family Medicine

## 2017-03-01 DIAGNOSIS — E038 Other specified hypothyroidism: Secondary | ICD-10-CM

## 2017-03-01 NOTE — Telephone Encounter (Signed)
Lab Results  Component Value Date   TSH 3.551 02/11/2017

## 2017-03-04 ENCOUNTER — Encounter: Payer: Self-pay | Admitting: Cardiovascular Disease

## 2017-03-04 ENCOUNTER — Ambulatory Visit (INDEPENDENT_AMBULATORY_CARE_PROVIDER_SITE_OTHER): Payer: Medicaid Other | Admitting: Cardiovascular Disease

## 2017-03-04 VITALS — BP 104/80 | HR 60 | Ht 67.0 in | Wt 165.8 lb

## 2017-03-04 DIAGNOSIS — I5022 Chronic systolic (congestive) heart failure: Secondary | ICD-10-CM

## 2017-03-04 DIAGNOSIS — I10 Essential (primary) hypertension: Secondary | ICD-10-CM | POA: Diagnosis not present

## 2017-03-04 DIAGNOSIS — I25118 Atherosclerotic heart disease of native coronary artery with other forms of angina pectoris: Secondary | ICD-10-CM

## 2017-03-04 DIAGNOSIS — Z720 Tobacco use: Secondary | ICD-10-CM

## 2017-03-04 DIAGNOSIS — E785 Hyperlipidemia, unspecified: Secondary | ICD-10-CM | POA: Diagnosis not present

## 2017-03-04 NOTE — Patient Instructions (Signed)
Medication Instructions: Continue same medications.   Labwork: None.   Procedures/Testing: None.   Follow-Up: 6 months with Dr. Flynn Lininger.   Any Additional Special Instructions Will Be Listed Below (If Applicable).     If you need a refill on your cardiac medications before your next appointment, please call your pharmacy.   

## 2017-03-04 NOTE — Progress Notes (Signed)
Cardiology Office Note   Date:  03/04/2017   ID:  Angelica Herrera, DOB 1971-03-25, MRN [id number]  PCP:  Arnetha Courser, MD  Cardiologist:   Kathlyn Sacramento, MD  Nephrologist: Dr. Rolly Salter  No chief complaint on file.     History of Present Illness: Angelica Herrera is a 46 y.o. female who For a follow-up visit regarding coronary artery disease and chronic systolic heart failure. She was last seen by me in 2013. She presented in October 2013 with acute anterolateral ST elevation myocardial infarction. She underwent emergent cardiac catheterization  which showed flush occlusion of left main coronary artery. There was significant difficulty in engaging the left main. Left main balloon angioplasty followed by drug-eluting stent placement was performed. She also had balloon angioplasty done to the LAD due to embolization from the left main. The patient had significant hemodynamic instability with ventricular tachycardia and hypotension.   She is a smoker. She had atypical chest pain in 2017. A pharmacologic nuclear stress test  showed prior anterior infarct with mild peri-infarct ischemia and  ejection fraction of 33%. I proceeded with cardiac catheterization which showed patent left main stent with moderate mid LAD stenosis and occluded second diagonal. Ejection fraction was 35-40% with anterior wall hypokinesis. She was seen recently by [facility] for chest pain.  She underwent a pharmacologic nuclear stress test which showed a similar finding of prior anterior infarct with mild peri-infarct ischemia.  She reports that her chest pain is less than before.  He does describe generalized fatigue, anxiety and orthostatic dizziness.  She continues to be under stress.  Fortunately, smoke about 7-8 cigarettes a day.  Past Medical History:  Diagnosis Date  . CAD (coronary artery disease)    a. 02/2012 s/p anterolateral STEMI/Cath/PCI: LM 100 (3.0x15 DES ), LAD sm, severely diseased (PTCA w/ 1.16mm  balloon), LCX nl, RI moderate size, nl, RCA 30-35m, AM/PDA/PLA nl, R->L collats noted; b. cath 08/03/15: widely patent ostLM stent at 10%, mLAD 60%,  mRCA 30%, EF 35-40%, ant wall HK, mildly elevated LVEDP  . CKD (chronic kidney disease), stage IV ([place])   . HTN (hypertension)   . Hyperlipidemia   . Hypothyroidism   . Ischemic cardiomyopathy    a. 02/2012 Echo: EF 40-45%, ant/lat HK, Gr1 DD, Mild MR, PASP 45mmHg.  Marland Kitchen NSVT (nonsustained ventricular tachycardia) ([place])    a. 02/2012 post-MI  . Obesity   . Tobacco abuse     Past Surgical History:  Procedure Laterality Date  . ABDOMINAL HYSTERECTOMY    . CARDIAC CATHETERIZATION  2013   [facility] s/p stent  . CARDIAC CATHETERIZATION Left 08/03/2015   Procedure: Left Heart Cath and Coronary Angiography;  Surgeon: Wellington Hampshire, MD;  Location: [facility];  Service: Cardiovascular;  Laterality: Left;  . DILATION AND CURETTAGE OF UTERUS    . LEFT HEART CATHETERIZATION WITH CORONARY ANGIOGRAM N/A 02/11/2012   Procedure: LEFT HEART CATHETERIZATION WITH CORONARY ANGIOGRAM;  Surgeon: [name], MD;  Location: [facility] CATH LAB;  Service: Cardiovascular;  Laterality: N/A;     Current Outpatient Medications  Medication Sig Dispense Refill  . aspirin 81 MG tablet Take 1 tablet (81 mg total) by mouth daily. 30 tablet   . atorvastatin (LIPITOR) 40 MG tablet TAKE 1 TABLET AT BEDTIME NIGHTLY FOR CHOLESTEROL 30 tablet 3  . carvedilol (COREG) 6.25 MG tablet Take 1 tablet (6.25 mg total) by mouth 2 (two) times daily. 180 tablet 3  . clopidogrel (PLAVIX) 75 MG tablet  TAKE ONE (1) TABLET BY MOUTH ONCE DAILY 30 tablet 3  . furosemide (LASIX) 20 MG tablet Take 1 tablet (20 mg total) by mouth 2 (two) times daily. (Patient taking differently: Take 20 mg by mouth daily. ) 180 tablet 3  . gabapentin (NEURONTIN) 300 MG capsule TAKE (1) CAPSULE BY MOUTH THREE TIMES A DAY 90 capsule 1  . levothyroxine (SYNTHROID, LEVOTHROID) 112 MCG tablet TAKE 1 TABLET ON AN  EMPTY STOMACH WITH A GLASS OF WATER AT LEAST 30 TO 60 MINUTES BEFORE BREAKFAST. 30 tablet 11  . lisinopril (PRINIVIL,ZESTRIL) 10 MG tablet Take 1 tablet (10 mg total) by mouth daily. 90 tablet 3  . magnesium oxide (MAG-OX) 400 MG tablet Take 1 tablet (400 mg total) by mouth daily.    . nitroGLYCERIN (NITROSTAT) 0.4 MG SL tablet Place 1 tablet (0.4 mg total) under the tongue every 5 (five) minutes x 3 doses as needed for chest pain. 25 tablet 3  . PARoxetine (PAXIL) 40 MG tablet TAKE (1) TABLET BY MOUTH EVERY MORNING 30 tablet 1  . potassium chloride SA (K-DUR,KLOR-CON) 20 MEQ tablet Take 20 mEq (1 tablet) by mouth twice a day for 3 days, then take 40mEq (1 tablet) once a day. (Patient taking differently: Take 20 mEq by mouth daily. Take 20 mEq (1 tablet) by mouth twice a day for 3 days, then take 62mEq (1 tablet) once a day.) 90 tablet 3   No current facility-administered medications for this visit.     Allergies:   Patient has no known allergies.    Social History:  The patient  reports that she has been smoking cigarettes.  She has a 30.00 pack-year smoking history. she has never used smokeless tobacco. She reports that she drinks alcohol. She reports that she does not use drugs.   Family History:  The patient's family history includes AAA (abdominal aortic aneurysm) in her father; Cancer in her paternal grandfather; Diabetes in her mother; Hyperlipidemia in her mother; Hypertension in her brother, brother, daughter, father, mother, sister, and sister; Kidney disease in her mother; Supraventricular tachycardia in her sister; Thyroid disease in her father.    ROS:  Please see the history of present illness.   Otherwise, review of systems are positive for none.   All other systems are reviewed and negative.    PHYSICAL EXAM: VS:  There were no vitals taken for this visit. , BMI There is no height or weight on file to calculate BMI. GEN: Well nourished, well developed, in no acute distress    HEENT: normal  Neck: no JVD, carotid bruits, or masses Cardiac: RRR; no murmurs, rubs, or gallops,no edema  Respiratory:  clear to auscultation bilaterally, normal work of breathing GI: soft, nontender, nondistended, + BS MS: no deformity or atrophy  Skin: warm and dry, no rash Neuro:  Strength and sensation are intact Psych: euthymic mood, full affect   EKG:  EKG is ordered today. The ekg ordered today demonstrates normal sinus rhythm with incomplete right bundle branch block and left anterior fascicular block.  Biatrial enlargement   Recent Labs: 02/11/2017: BUN 23; Creatinine, Ser 1.35; Hemoglobin 14.3; Magnesium 2.2; Platelets 151; Potassium 3.9; Sodium 140; TSH 3.551    Lipid Panel    Component Value Date/Time   CHOL 109 11/06/2015 0829   TRIG 57 11/06/2015 0829   HDL 56 11/06/2015 0829   CHOLHDL 1.9 11/06/2015 0829   CHOLHDL 3.9 02/13/2012 0525   VLDL 23 02/13/2012 0525   LDLCALC 42 11/06/2015 0829  Wt Readings from Last 3 Encounters:  02/11/17 165 lb 12 oz (75.2 kg)  07/23/16 176 lb 8 oz (80.1 kg)  05/09/16 170 lb (77.1 kg)         ASSESSMENT AND PLAN:  1.  Coronary artery disease involving native coronary arteries with other forms of angina:   I reviewed most recent cardiac catheterization and reviewed most recent cardiac stress testing with her.  I do not think it is different from prior stress test in 2017.  Chest pain improved overall and thus I recommend continuing medical therapy.  I suspect that stress is contributing to a lot of her symptoms.  2. Chronic systolic heart failure: Due to ischemic cardiomyopathy with most recent ejection fraction 35-40%. No evidence of volume overload. Continue treatment with carvedilol and lisinopril.  She appears to be euvolemic on furosemide 20 mg once daily.  Recent labs showed stable kidney function.  3. Essential hypertension: Blood pressure is well controlled.  She does have mild orthostatic dizziness.  I asked  her not to change position quickly.  4. Hyperlipidemia: Most recent lipid profile showed significant improvement in LDL down to 42. Continue treatment with atorvastatin.   5. Tobacco use: I discussed with her the importance of cessation. She reports being under significant stress.  6. Chronic kidney disease: Followed by nephrology. most recent creatinine was 1.35.     Disposition:   FU with me in 6 months  Signed,  Kathlyn Sacramento, MD  03/04/2017 2:16 PM    [place]

## 2017-03-25 ENCOUNTER — Ambulatory Visit: Payer: Self-pay | Admitting: Physician Assistant

## 2017-03-25 ENCOUNTER — Ambulatory Visit: Payer: Self-pay | Admitting: Nurse Practitioner

## 2017-04-10 ENCOUNTER — Other Ambulatory Visit: Payer: Self-pay | Admitting: Family Medicine

## 2017-04-10 NOTE — Telephone Encounter (Signed)
Patient needs an appointment for further refills I received requests for gabapentin and paroxetine She should not be out of these until mid-January We'll see her before then please for a visit to provide further refills Thank you

## 2017-04-11 NOTE — Telephone Encounter (Signed)
Called pt to schedule an appt. No answer,no option to LM

## 2017-05-13 ENCOUNTER — Other Ambulatory Visit: Payer: Self-pay

## 2017-05-13 ENCOUNTER — Emergency Department
Admission: EM | Admit: 2017-05-13 | Discharge: 2017-05-13 | Disposition: A | Discharge status: Home / Self Care | Payer: Medicaid Other | Attending: Emergency Medicine | Admitting: Emergency Medicine

## 2017-05-13 ENCOUNTER — Encounter: Payer: Self-pay | Admitting: Emergency Medicine

## 2017-05-13 ENCOUNTER — Emergency Department: Payer: Medicaid Other

## 2017-05-13 DIAGNOSIS — I129 Hypertensive chronic kidney disease with stage 1 through stage 4 chronic kidney disease, or unspecified chronic kidney disease: Secondary | ICD-10-CM | POA: Insufficient documentation

## 2017-05-13 DIAGNOSIS — I2510 Atherosclerotic heart disease of native coronary artery without angina pectoris: Secondary | ICD-10-CM | POA: Diagnosis not present

## 2017-05-13 DIAGNOSIS — R079 Chest pain, unspecified: Secondary | ICD-10-CM | POA: Diagnosis present

## 2017-05-13 DIAGNOSIS — N184 Chronic kidney disease, stage 4 (severe): Secondary | ICD-10-CM | POA: Diagnosis not present

## 2017-05-13 DIAGNOSIS — K2900 Acute gastritis without bleeding: Secondary | ICD-10-CM | POA: Diagnosis not present

## 2017-05-13 DIAGNOSIS — F17210 Nicotine dependence, cigarettes, uncomplicated: Secondary | ICD-10-CM | POA: Insufficient documentation

## 2017-05-13 DIAGNOSIS — R112 Nausea with vomiting, unspecified: Secondary | ICD-10-CM | POA: Diagnosis not present

## 2017-05-13 DIAGNOSIS — E039 Hypothyroidism, unspecified: Secondary | ICD-10-CM | POA: Diagnosis not present

## 2017-05-13 LAB — COMPREHENSIVE METABOLIC PANEL
ALT: 16 U/L (ref 14–54)
ANION GAP: 13 (ref 5–15)
AST: 33 U/L (ref 15–41)
Albumin: 4.3 g/dL (ref 3.5–5.0)
Alkaline Phosphatase: 85 U/L (ref 38–126)
BUN: 31 mg/dL — ABNORMAL HIGH (ref 6–20)
CHLORIDE: 107 mmol/L (ref 101–111)
CO2: 19 mmol/L — AB (ref 22–32)
CREATININE: 1.64 mg/dL — AB (ref 0.44–1.00)
Calcium: 9.2 mg/dL (ref 8.9–10.3)
GFR, EST AFRICAN AMERICAN: 42 mL/min — AB (ref >60)
GFR, EST NON AFRICAN AMERICAN: 36 mL/min — AB (ref >60)
Glucose, Bld: 151 mg/dL — ABNORMAL HIGH (ref 65–99)
POTASSIUM: 3.9 mmol/L (ref 3.5–5.1)
SODIUM: 139 mmol/L (ref 135–145)
Total Bilirubin: 1 mg/dL (ref 0.3–1.2)
Total Protein: 7.7 g/dL (ref 6.5–8.1)

## 2017-05-13 LAB — CBC
HCT: 47 % (ref 35.0–47.0)
Hemoglobin: 15.5 g/dL (ref 12.0–16.0)
MCH: 30.7 pg (ref 26.0–34.0)
MCHC: 33 g/dL (ref 32.0–36.0)
MCV: 93.1 fL (ref 80.0–100.0)
PLATELETS: 238 10*3/uL (ref 150–440)
RBC: 5.04 MIL/uL (ref 3.80–5.20)
RDW: 15.5 % — AB (ref 11.5–14.5)
WBC: 13.9 10*3/uL — ABNORMAL HIGH (ref 3.6–11.0)

## 2017-05-13 LAB — TROPONIN I: Troponin I: <0.03 ng/mL (ref <0.03)

## 2017-05-13 LAB — ETHANOL

## 2017-05-13 LAB — GLUCOSE, CAPILLARY: GLUCOSE-CAPILLARY: 163 mg/dL — AB (ref 65–99)

## 2017-05-13 MED ORDER — PROMETHAZINE HCL 25 MG/ML IJ SOLN
25.0000 mg | Freq: Once | INTRAMUSCULAR | Status: AC
  Administered 2017-05-13: 25 mg via INTRAVENOUS
  Filled 2017-05-13: qty 1

## 2017-05-13 MED ORDER — ONDANSETRON HCL 4 MG/2ML IJ SOLN
4.0000 mg | Freq: Once | INTRAMUSCULAR | Status: AC
  Administered 2017-05-13: 4 mg via INTRAVENOUS
  Filled 2017-05-13: qty 2

## 2017-05-13 MED ORDER — LORAZEPAM 2 MG/ML IJ SOLN
0.5000 mg | Freq: Once | INTRAMUSCULAR | Status: AC
  Administered 2017-05-13: 0.5 mg via INTRAVENOUS
  Filled 2017-05-13: qty 1

## 2017-05-13 MED ORDER — PROMETHAZINE HCL 25 MG PO TABS: 25.0000 mg | ORAL_TABLET | ORAL | 1 refills | Status: DC | PRN

## 2017-05-13 MED ORDER — SODIUM CHLORIDE 0.9 % IV SOLN
Freq: Once | INTRAVENOUS | Status: AC
  Administered 2017-05-13: 10:00:00 via INTRAVENOUS

## 2017-05-13 MED ORDER — FAMOTIDINE 20 MG PO TABS: 20.0000 mg | ORAL_TABLET | Freq: Two times a day (BID) | ORAL | 1 refills | Status: DC

## 2017-05-13 MED ORDER — FAMOTIDINE IN NACL 20-0.9 MG/50ML-% IV SOLN
20.0000 mg | Freq: Once | INTRAVENOUS | Status: AC
  Administered 2017-05-13: 20 mg via INTRAVENOUS
  Filled 2017-05-13: qty 50

## 2017-05-13 NOTE — ED Provider Notes (Addendum)
[facility] Emergency Department Provider Note       Time seen: ----------------------------------------- 9:14 AM on 05/13/2017 -----------------------------------------   I have reviewed the triage vital signs and the nursing notes.  HISTORY   Chief Complaint No chief complaint on file.    HPI Angelica Herrera is a 47 y.o. female with a history of coronary artery disease, chronic kidney disease, hypertension, hyperlipidemia and hypothyroidism who presents to the ED for chest pain as well as persistent nausea and feeling like she needs to vomit since this morning.  Patient reports she was drinking last night and she typically does not drink much alcohol.  She had did have some liquor and beer last night and was drinking until about 2 AM.  She reports she is also been short of breath and was on blood thinners but has run out of them 2 months ago.  Past Medical History:  Diagnosis Date  . CAD (coronary artery disease)    a. 02/2012 s/p anterolateral STEMI/Cath/PCI: LM 100 (3.0x15 DES ), LAD sm, severely diseased (PTCA w/ 1.52mm balloon), LCX nl, RI moderate size, nl, RCA 30-81m, AM/PDA/PLA nl, R->L collats noted; b. cath 08/03/15: widely patent ostLM stent at 10%, mLAD 60%,  mRCA 30%, EF 35-40%, ant wall HK, mildly elevated LVEDP  . CKD (chronic kidney disease), stage IV ([place])   . HTN (hypertension)   . Hyperlipidemia   . Hypothyroidism   . Ischemic cardiomyopathy    a. 02/2012 Echo: EF 40-45%, ant/lat HK, Gr1 DD, Mild MR, PASP 63mmHg.  Marland Kitchen NSVT (nonsustained ventricular tachycardia) ([place])    a. 02/2012 post-MI  . Obesity   . Tobacco abuse     Patient Active Problem List   Diagnosis Date Noted  . Controlled substance agreement signed 09/07/2015  . Coronary artery disease involving native coronary artery   . Allergic rhinitis due to pollen 06/28/2015  . GAD (generalized anxiety disorder) 03/17/2015  . Degenerative disc disease, cervical 03/17/2015  .  Coronary artery disease involving native coronary artery of native heart without angina pectoris   . Hyperlipidemia   . Obesity   . CKD (chronic kidney disease), stage IV ([place])   . Ischemic cardiomyopathy   . NSVT (nonsustained ventricular tachycardia) ([place])   . Hypothyroidism   . Acute MI anterior wall first episode care ([place]) 02/12/2012  . Essential hypertension, malignant 02/12/2012  . Tobacco abuse 02/12/2012    Past Surgical History:  Procedure Laterality Date  . ABDOMINAL HYSTERECTOMY    . CARDIAC CATHETERIZATION  2013   [facility] s/p stent  . CARDIAC CATHETERIZATION Left 08/03/2015   Procedure: Left Heart Cath and Coronary Angiography;  Surgeon: Wellington Hampshire, MD;  Location: [facility];  Service: Cardiovascular;  Laterality: Left;  . DILATION AND CURETTAGE OF UTERUS    . LEFT HEART CATHETERIZATION WITH CORONARY ANGIOGRAM N/A 02/11/2012   Procedure: LEFT HEART CATHETERIZATION WITH CORONARY ANGIOGRAM;  Surgeon: [name], MD;  Location: [facility];  Service: Cardiovascular;  Laterality: N/A;    Allergies Patient has no known allergies.  Social History Social History   Tobacco Use  . Smoking status: Current Every Day Smoker    Packs/day: 0.25    Years: 30.00    Pack years: 7.50    Types: Cigarettes  . Smokeless tobacco: Never Used  Substance Use Topics  . Alcohol use: Yes  . Drug use: No    Review of Systems Constitutional: Negative for fever. Cardiovascular: Positive for chest pain Respiratory: Positive  for shortness of breath Gastrointestinal: Positive for abdominal pain, vomiting Musculoskeletal: Negative for back pain. Skin: Negative for rash. Neurological: Negative for headaches, positive for weakness  All systems negative/normal/unremarkable except as stated in the HPI  ____________________________________________   PHYSICAL EXAM:  VITAL SIGNS: ED Triage Vitals  Enc Vitals Group     BP      Pulse      Resp      Temp      Temp src       SpO2      Weight      Height      Head Circumference      Peak Flow      Pain Score      Pain Loc      Pain Edu?      Excl. in [place]?    Constitutional: Alert and oriented. Well appearing and in no distress. Eyes: Conjunctivae are normal. Normal extraocular movements. ENT   Head: Normocephalic and atraumatic.   Nose: No congestion/rhinnorhea.   Mouth/Throat: Mucous membranes are moist.   Neck: No stridor. Cardiovascular: Normal rate, regular rhythm. No murmurs, rubs, or gallops. Respiratory: Normal respiratory effort without tachypnea nor retractions. Breath sounds are clear and equal bilaterally. No wheezes/rales/rhonchi. Gastrointestinal: Soft and nontender. Normal bowel sounds Musculoskeletal: Nontender with normal range of motion in extremities. No lower extremity tenderness nor edema. Neurologic:  Normal speech and language. No gross focal neurologic deficits are appreciated.  Skin:  Skin is warm, dry and intact. No rash noted. Psychiatric: Mood and affect are normal. Speech and behavior are normal.  ____________________________________________  EKG: Interpreted by me.  Sinus rhythm the rate of 73 bpm, normal PR interval, normal QRS, long QT.  Possible anterior infarct age-indeterminate  ____________________________________________  ED COURSE:  As part of my medical decision making, I reviewed the following data within the [place] History obtained from family if available, nursing notes, old chart and ekg, as well as notes from prior ED visits. Patient presented for chest pain as well as shortness of breath and vomiting, we will assess with labs and imaging as indicated at this time. Clinical Course as of May 14 1311  Tue May 13, 2017  0941 Patient has been gagging herself in the room by sticking her finger into the back of her throat.  [JW]    Clinical Course User Index [JW] Earleen Newport, MD    Procedures ____________________________________________   LABS (pertinent positives/negatives)  Labs Reviewed  GLUCOSE, CAPILLARY - Abnormal; Notable for the following components:      Result Value   Glucose-Capillary 163 (*)    All other components within normal limits  CBC - Abnormal; Notable for the following components:   WBC 13.9 (*)    RDW 15.5 (*)    All other components within normal limits  COMPREHENSIVE METABOLIC PANEL - Abnormal; Notable for the following components:   CO2 19 (*)    Glucose, Bld 151 (*)    BUN 31 (*)    Creatinine, Ser 1.64 (*)    GFR calc non Af Amer 36 (*)    GFR calc Af Amer 42 (*)    All other components within normal limits  ETHANOL  TROPONIN I  TROPONIN I  POC URINE PREG, ED    RADIOLOGY Images were viewed by me  Chest x-ray IMPRESSION: Chronic bronchitic-smoking related changes in both lungs. Low-grade pulmonary vascular congestion and mild interstitial edema. ____________________________________________  DIFFERENTIAL DIAGNOSIS   Gastritis,  pancreatitis, gastroenteritis, unstable angina, MI, PE, pneumothorax, dissection  FINAL ASSESSMENT AND PLAN  Chest pain, vomiting   Plan: Patient had presented for chest pain and vomiting which is likely alcohol induced gastritis. Patient's labs revealed some dehydration but otherwise no acute process. Patient's imaging revealed bronchitic changes and possibly mild edema.  She did require several doses of antiemetics but overall appears stable for outpatient follow-up.   Earleen Newport, MD   Note: This note was generated in part or whole with voice recognition software. Voice recognition is usually quite accurate but there are transcription errors that can and very often do occur. I apologize for any typographical errors that were not detected and corrected.     Earleen Newport, MD 05/13/17 1314    Earleen Newport, MD 05/13/17 1355

## 2017-05-13 NOTE — ED Notes (Signed)
Pt given something to drink and warm blanket

## 2017-05-13 NOTE — ED Triage Notes (Signed)
Pt to ER via [facility] with c/o chest pain that began at 0630 this am. She reports that she has been SOB, she was on blood thinners but had run out of them two months ago. Pt states that she is nauseated and feels numb. EMS gave her baby aspirin but she spat them out.

## 2017-05-13 NOTE — ED Notes (Signed)
Pt ripped off cardiac leads and ambulated to toilet without this Rn's knowledge.

## 2017-05-13 NOTE — ED Notes (Signed)
Pt ambulating around room, stating she has someone to call for ride. Refusing wheelchair.

## 2017-05-29 ENCOUNTER — Encounter: Payer: Self-pay | Admitting: Family Medicine

## 2017-06-02 ENCOUNTER — Other Ambulatory Visit: Payer: Self-pay | Admitting: Family Medicine

## 2017-06-02 NOTE — Telephone Encounter (Signed)
Will send staff message to [facility] to schedule an appointment Since she has been out of gabapentin, I will not refill for TID dosing; just QHS See previous request from 04/10/17; patient needs appt for further refills Message put in SIG as well to pharmacy

## 2017-06-02 NOTE — Telephone Encounter (Signed)
There is another note open about these same prescriptions She needs an appt One month of meds sent (gabapentin for QHS only, not TID)

## 2017-06-02 NOTE — Telephone Encounter (Signed)
Copied from [place] #[id number]. Topic: Quick Communication - Rx Refill/Question >> Jun 02, 2017 10:50 AM Celedonio Savage L wrote: Medication: gabapentin (NEURONTIN) 300 MG capsule  PARoxetine (PAXIL) 40 MG tablet   patient has been out of these two medicines for one month and would like to get this refilled today while she has a ride   Has the patient contacted their pharmacy? Yes.     (Agent: If no, request that the patient contact the pharmacy for the refill.)   Preferred Pharmacy (with phone number or street name): [place] [place] [facility] [place] [phone] (Phone) [phone] (Fax)     Agent: Please be advised that RX refills may take up to 3 business days. We ask that you follow-up with your pharmacy.

## 2017-06-23 ENCOUNTER — Ambulatory Visit: Payer: Medicaid Other | Admitting: Family Medicine

## 2017-06-25 ENCOUNTER — Other Ambulatory Visit: Payer: Self-pay | Admitting: Physician Assistant

## 2017-06-27 ENCOUNTER — Ambulatory Visit: Payer: Medicaid Other | Admitting: Family Medicine

## 2017-07-16 ENCOUNTER — Other Ambulatory Visit: Payer: Self-pay | Admitting: Physician Assistant

## 2017-07-16 ENCOUNTER — Other Ambulatory Visit: Payer: Self-pay | Admitting: Cardiovascular Disease

## 2017-07-16 ENCOUNTER — Other Ambulatory Visit: Payer: Self-pay | Admitting: Family Medicine

## 2017-07-16 NOTE — Telephone Encounter (Signed)
Patient has canceled or no showed four consecutive times since last visit Note added to prescriptions in February that appointment was needed Prescriptions denied

## 2017-08-20 ENCOUNTER — Ambulatory Visit: Payer: Medicaid Other | Admitting: Nurse Practitioner

## 2017-08-20 ENCOUNTER — Encounter: Payer: Self-pay | Admitting: Nurse Practitioner

## 2017-08-20 VITALS — BP 110/70 | HR 82 | Temp 98.5°F | Resp 16 | Ht 67.0 in | Wt 165.0 lb

## 2017-08-20 DIAGNOSIS — G8929 Other chronic pain: Secondary | ICD-10-CM | POA: Diagnosis not present

## 2017-08-20 DIAGNOSIS — N184 Chronic kidney disease, stage 4 (severe): Secondary | ICD-10-CM

## 2017-08-20 DIAGNOSIS — F411 Generalized anxiety disorder: Secondary | ICD-10-CM

## 2017-08-20 DIAGNOSIS — M542 Cervicalgia: Secondary | ICD-10-CM | POA: Diagnosis not present

## 2017-08-20 DIAGNOSIS — M25474 Effusion, right foot: Secondary | ICD-10-CM | POA: Diagnosis not present

## 2017-08-20 DIAGNOSIS — F3341 Major depressive disorder, recurrent, in partial remission: Secondary | ICD-10-CM

## 2017-08-20 MED ORDER — PAROXETINE HCL 40 MG PO TABS: 40.0000 mg | ORAL_TABLET | Freq: Every day | ORAL | 3 refills | Status: DC

## 2017-08-20 MED ORDER — GABAPENTIN 300 MG PO CAPS
300.0000 mg | ORAL_CAPSULE | Freq: Every day | ORAL | 3 refills | Status: DC
Start: 2017-08-20 — End: 2017-11-21

## 2017-08-20 MED ORDER — PREDNISONE 5 MG (21) PO TBPK: ORAL_TABLET | ORAL | 0 refills | Status: DC

## 2017-08-20 MED ORDER — COLCHICINE 0.6 MG PO TABS: 0.6000 mg | ORAL_TABLET | Freq: Every day | ORAL | 0 refills | Status: DC

## 2017-08-20 NOTE — Patient Instructions (Signed)
Avoid: All organ meats: These include liver, kidneys, sweetbreads and brain Game meats: Examples include pheasant, veal and venison Fish: Herring, trout, mackerel, tuna, sardines, anchovies, haddock and more Other seafood: Scallops, crab, shrimp and roe Sugary beverages: Especially fruit juices and sugary sodas Added sugars: Honey, agave nectar and high-fructose corn syrup Yeasts: Nutritional yeast, brewer's yeast and other yeast supplements   Eat: Fruits: All fruits are generally fine for gout. Cherries may even help prevent attacks by lowering uric acid levels and reducing inflammation (23, 24). Vegetables: All vegetables are fine, including potatoes, peas, mushrooms, eggplants and dark green leafy vegetables. Legumes: All legumes are fine, including lentils, beans, soybeans and tofu. Nuts: All nuts and seeds. Whole grains: These include oats, brown rice and barley. Dairy products: All dairy is safe, but low-fat dairy appears to be especially beneficial (11, 18). Eggs Beverages: Coffee, tea and green tea. Herbs and spices: All herbs and spices. Plant-based oils: Including canola, coconut, olive and flax oils.   Gout Gout is painful swelling that can happen in some of your joints. Gout is a type of arthritis. This condition is caused by having too much uric acid in your body. Uric acid is a chemical that is made when your body breaks down substances called purines. If your body has too much uric acid, sharp crystals can form and build up in your joints. This causes pain and swelling. Gout attacks can happen quickly and be very painful (acute gout). Over time, the attacks can affect more joints and happen more often (chronic gout). Follow these instructions at home: During a Gout Attack  If directed, put ice on the painful area: ? Put ice in a plastic bag. ? Place a towel between your skin and the bag. ? Leave the ice on for 20 minutes, 2-3 times a day.  Rest the joint as much as possible. If the  joint is in your leg, you may be given crutches to use.  Raise (elevate) the painful joint above the level of your heart as often as you can.  Drink enough fluids to keep your pee (urine) clear or pale yellow.  Take over-the-counter and prescription medicines only as told by your doctor.  Do not drive or use heavy machinery while taking prescription pain medicine.  Follow instructions from your doctor about what you can or cannot eat and drink.  Return to your normal activities as told by your doctor. Ask your doctor what activities are safe for you. Avoiding Future Gout Attacks  Follow a low-purine diet as told by a specialist (dietitian) or your doctor. Avoid foods and drinks that have a lot of purines, such as: ? Liver. ? Kidney. ? Anchovies. ? Asparagus. ? Herring. ? Mushrooms ? Mussels. ? Beer.  Limit alcohol intake to no more than 1 drink a day for nonpregnant women and 2 drinks a day for men. One drink equals 12 oz of beer, 5 oz of wine, or 1 oz of hard liquor.  Stay at a healthy weight or lose weight if you are overweight. If you want to lose weight, talk with your doctor. It is important that you do not lose weight too fast.  Start or continue an exercise plan as told by your doctor.  Drink enough fluids to keep your pee clear or pale yellow.  Take over-the-counter and prescription medicines only as told by your doctor.  Keep all follow-up visits as told by your doctor. This is important. Contact a doctor if:  You  have another gout attack.  You still have symptoms of a gout attack after10 days of treatment.  You have problems (side effects) because of your medicines.  You have chills or a fever.  You have burning pain when you pee (urinate).  You have pain in your lower back or belly. Get help right away if:  You have very bad pain.  Your pain cannot be controlled.  You cannot pee. This information is not intended to replace advice given to you by  your health care provider. Make sure you discuss any questions you have with your health care provider. Document Released: 01/09/2008 Document Revised: 09/07/2015 Document Reviewed: 01/12/2015 Elsevier Interactive Patient Education  [employer].

## 2017-08-20 NOTE — Progress Notes (Addendum)
colchName: Angelica Herrera   MRN: [id number]    DOB: [birth year]   Date:08/20/2017       Progress Note  Subjective  Chief Complaint  Chief Complaint  Patient presents with  . Gout    right big toe for 3 days  . Medication Refill    HPI  Gout Patient endorses pain right big toe redness and swelling started 4 days ago.  Positive: 1st metatarsophalangeal joint involvement, tx for hypertension, onset within one day, joint redness  No personal history, strong family history, denies injury.   Chronic pain Uses gabapentin for chronic neck and back pain.   MDD and GAD On paxil states overall is well controlled on medications. Has situational stress and difficulty with transportation that is added stressor.  Denies SI/HI   GAD 7 : Generalized Anxiety Score 08/20/2017  Nervous, Anxious, on Edge 0  Control/stop worrying 0  Worry too much - different things 0  Trouble relaxing 1  Restless 0  Easily annoyed or irritable 0  Afraid - awful might happen 0  Total GAD 7 Score 1  Anxiety Difficulty Not difficult at all    Depression screen [facility] 2/9 08/20/2017  Decreased Interest 0  Down, Depressed, Hopeless 0  PHQ - 2 Score 0  Altered sleeping -  Tired, decreased energy -  Change in appetite -  Feeling bad or failure about yourself  -  Trouble concentrating -  Moving slowly or fidgety/restless -  Suicidal thoughts -  PHQ-9 Score -   CKD Was managed by nephrology in the past but hasn't been in a few years. Denies oliguria Lab Results  Component Value Date   CREATININE 1.64 (H) 05/13/2017      Patient Active Problem List   Diagnosis Date Noted  . Controlled substance agreement signed 09/07/2015  . Coronary artery disease involving native coronary artery   . Allergic rhinitis due to pollen 06/28/2015  . GAD (generalized anxiety disorder) 03/17/2015  . Degenerative disc disease, cervical 03/17/2015  . Hyperlipidemia   . Obesity   . CKD (chronic kidney disease), stage IV  ([place])   . Ischemic cardiomyopathy   . NSVT (nonsustained ventricular tachycardia) ([place])   . Hypothyroidism   . History of acute anterior wall MI 02/12/2012  . Essential hypertension, malignant 02/12/2012  . Tobacco abuse 02/12/2012    Past Medical History:  Diagnosis Date  . CAD (coronary artery disease)    a. 02/2012 s/p anterolateral STEMI/Cath/PCI: LM 100 (3.0x15 DES ), LAD sm, severely diseased (PTCA w/ 1.70mm balloon), LCX nl, RI moderate size, nl, RCA 30-16m, AM/PDA/PLA nl, R->L collats noted; b. cath 08/03/15: widely patent ostLM stent at 10%, mLAD 60%,  mRCA 30%, EF 35-40%, ant wall HK, mildly elevated LVEDP  . CKD (chronic kidney disease), stage IV ([place])   . HTN (hypertension)   . Hyperlipidemia   . Hypothyroidism   . Ischemic cardiomyopathy    a. 02/2012 Echo: EF 40-45%, ant/lat HK, Gr1 DD, Mild MR, PASP 54mmHg.  Marland Kitchen NSVT (nonsustained ventricular tachycardia) ([place])    a. 02/2012 post-MI  . Obesity   . Tobacco abuse     Past Surgical History:  Procedure Laterality Date  . ABDOMINAL HYSTERECTOMY    . CARDIAC CATHETERIZATION  2013   [facility] s/p stent  . CARDIAC CATHETERIZATION Left 08/03/2015   Procedure: Left Heart Cath and Coronary Angiography;  Surgeon: Wellington Hampshire, MD;  Location: [facility];  Service: Cardiovascular;  Laterality: Left;  . DILATION AND CURETTAGE  OF UTERUS    . LEFT HEART CATHETERIZATION WITH CORONARY ANGIOGRAM N/A 02/11/2012   Procedure: LEFT HEART CATHETERIZATION WITH CORONARY ANGIOGRAM;  Surgeon: [name], MD;  Location: [facility] C[facility] LAB;  Service: Cardiovascular;  Laterality: N/A;    Social History   Tobacco Use  . Smoking status: Current Every Day Smoker    Packs/day: 0.25    Years: 30.00    Pack years: 7.50    Types: Cigarettes  . Smokeless tobacco: Never Used  Substance Use Topics  . Alcohol use: Yes     Current Outpatient Medications:  .  aspirin 81 MG tablet, Take 1 tablet (81 mg total) by mouth daily., Disp: 30 tablet,  Rfl:  .  atorvastatin (LIPITOR) 40 MG tablet, TAKE 1 TABLET AT BEDTIME NIGHTLY FOR CHOLESTEROL, Disp: 30 tablet, Rfl: 3 .  carvedilol (COREG) 6.25 MG tablet, Take 1 tablet (6.25 mg total) by mouth 2 (two) times daily., Disp: 180 tablet, Rfl: 3 .  clopidogrel (PLAVIX) 75 MG tablet, TAKE ONE (1) TABLET BY MOUTH ONCE DAILY, Disp: 30 tablet, Rfl: 3 .  famotidine (PEPCID) 20 MG tablet, Take 1 tablet (20 mg total) by mouth 2 (two) times daily., Disp: 60 tablet, Rfl: 1 .  gabapentin (NEURONTIN) 300 MG capsule, Take 1 capsule (300 mg total) by mouth at bedtime. Appointment needed for any further refills; just take one at bedtime, Disp: 30 capsule, Rfl: 0 .  levothyroxine (SYNTHROID, LEVOTHROID) 112 MCG tablet, TAKE 1 TABLET ON AN EMPTY STOMACH WITH A GLASS OF WATER AT LEAST 30 TO 60 MINUTES BEFORE BREAKFAST., Disp: 30 tablet, Rfl: 11 .  lisinopril (PRINIVIL,ZESTRIL) 10 MG tablet, TAKE ONE (1) TABLET BY MOUTH ONCE DAILY (Patient taking differently: TAKE ONE 1/2 TABLET BY MOUTH ONCE DAILY), Disp: 90 tablet, Rfl: 0 .  magnesium oxide (MAG-OX) 400 MG tablet, Take 1 tablet (400 mg total) by mouth daily., Disp: , Rfl:  .  nitroGLYCERIN (NITROSTAT) 0.4 MG SL tablet, Place 1 tablet (0.4 mg total) under the tongue every 5 (five) minutes x 3 doses as needed for chest pain., Disp: 25 tablet, Rfl: 3 .  PARoxetine (PAXIL) 40 MG tablet, Take 1 tablet (40 mg total) by mouth daily. Appointment needed for any further refills, Disp: 30 tablet, Rfl: 0 .  potassium chloride SA (K-DUR,KLOR-CON) 20 MEQ tablet, TAKE ONE (1) TABLET BY MOUTH ONCE DAILY, Disp: 30 tablet, Rfl: 3 .  promethazine (PHENERGAN) 25 MG tablet, Take 1 tablet (25 mg total) by mouth every 4 (four) hours as needed for nausea or vomiting., Disp: 30 tablet, Rfl: 1  No Known Allergies  ROS No other specific complaints in a complete review of systems (except as listed in HPI above).  Objective  Vitals:   08/20/17 1407  BP: 110/70  Pulse: 82  Resp: 16   Temp: 98.5 F (36.9 C)  TempSrc: Oral  SpO2: 97%  Weight: 165 lb (74.8 kg)  Height: 5\' 7"  (1.702 m)   Body mass index is 25.84 kg/m.  Nursing Note and Vital Signs reviewed.  Physical Exam   Constitutional: Patient appears well-developed and well-nourished.  Pt visibly  uncomfortable.  Cardiovascular: Normal rate, regular rhythm, S1/S2 present.  No murmur or rub heard.  Pulmonary/Chest: Effort normal and breath sounds clear. No respiratory distress or retractions. MSK: right first digit metatarsal pain, redness and swelling. Unable to put shoe on.  Psychiatric: Patient has a normal mood and affect. behavior is normal. Judgment and thought content normal.  No results found for this or  any previous visit (from the past 72 hour(s)).  Assessment & Plan  1. Swelling of first metatarsophalangeal (MTP) joint of right foot - Uric acid - predniSONE (STERAPRED UNI-PAK 21 TAB) 5 MG (21) TBPK tablet; Take 50mg  day1, 40mg  day 2, 30mg  day3, 20mg   Day 4, 10 mg day 5  Dispense: 21 tablet; Refill: 0 - colchicine 0.6 MG tablet; Take 1 tablet (0.6 mg total) by mouth daily. Take 2 pills at one time  Dispense: 2 tablet; Refill: 0 Informed patient to stop taking lipitor tonight and the next 2 nights. Due to colchicine  2. CKD (chronic kidney disease), stage IV (HCC) - BASIC METABOLIC PANEL WITH GFR  3. Recurrent major depressive disorder, in partial remission (HCC) - PARoxetine (PAXIL) 40 MG tablet; Take 1 tablet (40 mg total) by mouth daily. Appointment needed for any further refills  Dispense: 30 tablet; Refill: 3  4. GAD (generalized anxiety disorder) - PARoxetine (PAXIL) 40 MG tablet; Take 1 tablet (40 mg total) by mouth daily. Appointment needed for any further refills  Dispense: 30 tablet; Refill: 3  5. Chronic neck pain - gabapentin (NEURONTIN) 300 MG capsule; Take 1 capsule (300 mg total) by mouth at bedtime. Appointment needed for any further refills; just take one at bedtime  Dispense:  30 capsule; Refill: 3      -Red flags and when to present for emergency care or RTC including fever >101.42F, chest pain, shortness of breath, new/worsening/un-resolving symptoms, reviewed with patient at time of visit. Follow up and care instructions discussed and provided in AVS. -Reviewed Health Maintenance: pt had hysterectomy no cervix. Follow up in a month for chronic diseases   -------------------------------------------------- I have reviewed this encounter including the documentation in this note and/or discussed this patient with the provider, Suezanne Cheshire DNP AGNP-C. I am certifying that I agree with the content of this note as supervising physician. Enid Derry, [facility] 09/08/2017, 9:13 PM

## 2017-08-21 LAB — BASIC METABOLIC PANEL WITH GFR
BUN/Creatinine Ratio: 18 (calc) (ref 6–22)
BUN: 25 mg/dL (ref 7–25)
CALCIUM: 10.2 mg/dL (ref 8.6–10.2)
CHLORIDE: 104 mmol/L (ref 98–110)
CO2: 23 mmol/L (ref 20–32)
Creat: 1.38 mg/dL — ABNORMAL HIGH (ref 0.50–1.10)
GFR, Est African American: 52 mL/min/{1.73_m2} — ABNORMAL LOW (ref >=60)
GFR, Est Non African American: 45 mL/min/{1.73_m2} — ABNORMAL LOW (ref >=60)
GLUCOSE: 77 mg/dL (ref 65–99)
POTASSIUM: 4.2 mmol/L (ref 3.5–5.3)
SODIUM: 139 mmol/L (ref 135–146)

## 2017-08-21 LAB — URIC ACID: Uric Acid, Serum: 10.1 mg/dL — ABNORMAL HIGH (ref 2.5–7.0)

## 2017-09-16 ENCOUNTER — Other Ambulatory Visit: Payer: Self-pay | Admitting: Family Medicine

## 2017-09-16 MED ORDER — COLCHICINE 0.6 MG PO CAPS: ORAL_CAPSULE | ORAL | 0 refills | Status: DC

## 2017-09-16 NOTE — Progress Notes (Signed)
Plain generic colchicine NOT covered by her insurance [employer] is covered; new Rx sent

## 2017-11-06 ENCOUNTER — Other Ambulatory Visit: Payer: Self-pay | Admitting: Family Medicine

## 2017-11-06 DIAGNOSIS — G8929 Other chronic pain: Secondary | ICD-10-CM

## 2017-11-06 DIAGNOSIS — M542 Cervicalgia: Principal | ICD-10-CM

## 2017-11-09 ENCOUNTER — Other Ambulatory Visit: Payer: Self-pay

## 2017-11-09 ENCOUNTER — Encounter: Payer: Self-pay | Admitting: Emergency Medicine

## 2017-11-09 ENCOUNTER — Emergency Department
Admission: EM | Admit: 2017-11-09 | Discharge: 2017-11-09 | Disposition: A | Discharge status: Home / Self Care | Payer: Medicaid Other | Attending: Emergency Medicine | Admitting: Emergency Medicine

## 2017-11-09 DIAGNOSIS — I2510 Atherosclerotic heart disease of native coronary artery without angina pectoris: Secondary | ICD-10-CM | POA: Insufficient documentation

## 2017-11-09 DIAGNOSIS — Z7982 Long term (current) use of aspirin: Secondary | ICD-10-CM | POA: Insufficient documentation

## 2017-11-09 DIAGNOSIS — Z7901 Long term (current) use of anticoagulants: Secondary | ICD-10-CM | POA: Diagnosis not present

## 2017-11-09 DIAGNOSIS — I129 Hypertensive chronic kidney disease with stage 1 through stage 4 chronic kidney disease, or unspecified chronic kidney disease: Secondary | ICD-10-CM | POA: Insufficient documentation

## 2017-11-09 DIAGNOSIS — N184 Chronic kidney disease, stage 4 (severe): Secondary | ICD-10-CM | POA: Insufficient documentation

## 2017-11-09 DIAGNOSIS — Z79899 Other long term (current) drug therapy: Secondary | ICD-10-CM | POA: Diagnosis not present

## 2017-11-09 DIAGNOSIS — R030 Elevated blood-pressure reading, without diagnosis of hypertension: Secondary | ICD-10-CM

## 2017-11-09 DIAGNOSIS — M109 Gout, unspecified: Secondary | ICD-10-CM | POA: Diagnosis not present

## 2017-11-09 DIAGNOSIS — M25474 Effusion, right foot: Secondary | ICD-10-CM

## 2017-11-09 DIAGNOSIS — F17210 Nicotine dependence, cigarettes, uncomplicated: Secondary | ICD-10-CM | POA: Diagnosis not present

## 2017-11-09 DIAGNOSIS — M79671 Pain in right foot: Secondary | ICD-10-CM | POA: Diagnosis present

## 2017-11-09 HISTORY — DX: Unspecified osteoarthritis, unspecified site: M19.90

## 2017-11-09 MED ORDER — COLCHICINE 0.6 MG PO CAPS: ORAL_CAPSULE | ORAL | 0 refills | Status: DC

## 2017-11-09 MED ORDER — KETOROLAC TROMETHAMINE 30 MG/ML IJ SOLN
30.0000 mg | Freq: Once | INTRAMUSCULAR | Status: AC
  Administered 2017-11-09: 30 mg via INTRAMUSCULAR
  Filled 2017-11-09: qty 1

## 2017-11-09 MED ORDER — ONDANSETRON 4 MG PO TBDP
4.0000 mg | ORAL_TABLET | Freq: Once | ORAL | Status: AC
Start: 2017-11-09 — End: 2017-11-09
  Administered 2017-11-09: 4 mg via ORAL
  Filled 2017-11-09: qty 1

## 2017-11-09 MED ORDER — PREDNISONE 5 MG (21) PO TBPK: ORAL_TABLET | ORAL | 0 refills | Status: DC

## 2017-11-09 MED ORDER — PROMETHAZINE HCL 25 MG PO TABS: 25.0000 mg | ORAL_TABLET | Freq: Four times a day (QID) | ORAL | 1 refills | Status: DC | PRN

## 2017-11-09 MED ORDER — HYDROCODONE-ACETAMINOPHEN 5-325 MG PO TABS
1.0000 | ORAL_TABLET | Freq: Once | ORAL | Status: AC
  Administered 2017-11-09: 1 via ORAL
  Filled 2017-11-09: qty 1

## 2017-11-09 NOTE — ED Notes (Signed)
See triage note  Presents with pain and swelling to right for for couple of days  Denies any injury but states she was dx'd with gout in past

## 2017-11-09 NOTE — ED Triage Notes (Signed)
Gout pain to right foot x 2 days.

## 2017-11-09 NOTE — Discharge Instructions (Signed)
Begin taking colchicine and prednisone today.  Also follow-up with your primary care provider for your acute gout.  Today your blood pressure was elevated at 181/106.  Have your blood pressure rechecked at your primary care provider's office.  Continue taking your regular medication as prescribed by your doctor.  Read information about a low purine diet. Your medication has been sent to [facility] on [address].

## 2017-11-09 NOTE — ED Provider Notes (Signed)
[facility] Emergency Department Provider Note  ____________________________________________   First MD Initiated Contact with Patient 11/09/17 0932     (approximate)  I have reviewed the triage vital signs and the nursing notes.   HISTORY  Chief Complaint Foot Pain  HPI Angelica Herrera is a 47 y.o. female is here with complaint of gout to her right foot for the last 2 days.  Patient states that she had her last gout attack in May of this year.  At that time she took prednisone and colchicine with relief.  Today it is extremely painful to walk.  Patient is slightly nauseous from pain.  She denies any injury to her foot.  She rates her pain as a 10/10.   Past Medical History:  Diagnosis Date  . Arthritis    gout  . CAD (coronary artery disease)    a. 02/2012 s/p anterolateral STEMI/Cath/PCI: LM 100 (3.0x15 DES ), LAD sm, severely diseased (PTCA w/ 1.60mm balloon), LCX nl, RI moderate size, nl, RCA 30-70m, AM/PDA/PLA nl, R->L collats noted; b. cath 08/03/15: widely patent ostLM stent at 10%, mLAD 60%,  mRCA 30%, EF 35-40%, ant wall HK, mildly elevated LVEDP  . CKD (chronic kidney disease), stage IV ([place])   . HTN (hypertension)   . Hyperlipidemia   . Hypothyroidism   . Ischemic cardiomyopathy    a. 02/2012 Echo: EF 40-45%, ant/lat HK, Gr1 DD, Mild MR, PASP 43mmHg.  Marland Kitchen NSVT (nonsustained ventricular tachycardia) ([place])    a. 02/2012 post-MI  . Obesity   . Tobacco abuse     Patient Active Problem List   Diagnosis Date Noted  . Controlled substance agreement signed 09/07/2015  . Coronary artery disease involving native coronary artery   . Allergic rhinitis due to pollen 06/28/2015  . GAD (generalized anxiety disorder) 03/17/2015  . Degenerative disc disease, cervical 03/17/2015  . Hyperlipidemia   . Obesity   . CKD (chronic kidney disease), stage IV ([place])   . Ischemic cardiomyopathy   . NSVT (nonsustained ventricular tachycardia) ([place])   .  Hypothyroidism   . History of acute anterior wall MI 02/12/2012  . Essential hypertension, malignant 02/12/2012  . Tobacco abuse 02/12/2012    Past Surgical History:  Procedure Laterality Date  . ABDOMINAL HYSTERECTOMY    . CARDIAC CATHETERIZATION  2013   [facility] s/p stent  . CARDIAC CATHETERIZATION Left 08/03/2015   Procedure: Left Heart Cath and Coronary Angiography;  Surgeon: Wellington Hampshire, MD;  Location: [facility];  Service: Cardiovascular;  Laterality: Left;  . DILATION AND CURETTAGE OF UTERUS    . LEFT HEART CATHETERIZATION WITH CORONARY ANGIOGRAM N/A 02/11/2012   Procedure: LEFT HEART CATHETERIZATION WITH CORONARY ANGIOGRAM;  Surgeon: [name], MD;  Location: [facility] CATH [facility];  Service: Cardiovascular;  Laterality: N/A;    Prior to Admission medications   Medication Sig Start Date End Date Taking? Authorizing Provider  aspirin 81 MG tablet Take 1 tablet (81 mg total) by mouth daily. 02/15/12   Theora Gianotti, NP  atorvastatin (LIPITOR) 40 MG tablet TAKE 1 TABLET AT BEDTIME NIGHTLY FOR CHOLESTEROL 07/16/17   Minna Merritts, MD  carvedilol (COREG) 6.25 MG tablet Take 1 tablet (6.25 mg total) by mouth 2 (two) times daily. 02/11/17   [name], PA-C  clopidogrel (PLAVIX) 75 MG tablet TAKE ONE (1) TABLET BY MOUTH ONCE DAILY 02/27/17   Wellington Hampshire, MD  Colchicine (MITIGARE) 0.6 MG CAPS One tablet bid x 5 days 11/09/17  Johnn Hai, PA-C  famotidine (PEPCID) 20 MG tablet Take 1 tablet (20 mg total) by mouth 2 (two) times daily. 05/13/17   Earleen Newport, MD  gabapentin (NEURONTIN) 300 MG capsule Take 1 capsule (300 mg total) by mouth at bedtime. Appointment needed for any further refills; just take one at bedtime 08/20/17   Poulose, Bethel Born, NP  levothyroxine (SYNTHROID, LEVOTHROID) 112 MCG tablet TAKE 1 TABLET ON AN EMPTY STOMACH WITH A GLASS OF WATER AT LEAST 30 TO 60 MINUTES BEFORE BREAKFAST. 03/01/17   Lada, Satira Anis, MD  lisinopril  (PRINIVIL,ZESTRIL) 10 MG tablet TAKE ONE (1) TABLET BY MOUTH ONCE DAILY Patient taking differently: TAKE ONE 1/2 TABLET BY MOUTH ONCE DAILY 07/16/17   [name], PA-C  magnesium oxide (MAG-OX) 400 MG tablet Take 1 tablet (400 mg total) by mouth daily. 05/09/16   Dunn, Areta Haber, PA-C  nitroGLYCERIN (NITROSTAT) 0.4 MG SL tablet Place 1 tablet (0.4 mg total) under the tongue every 5 (five) minutes x 3 doses as needed for chest pain. 02/15/12   Theora Gianotti, NP  PARoxetine (PAXIL) 40 MG tablet Take 1 tablet (40 mg total) by mouth daily. Appointment needed for any further refills 08/20/17   Poulose, Bethel Born, NP  potassium chloride SA (K-DUR,KLOR-CON) 20 MEQ tablet TAKE ONE (1) TABLET BY MOUTH ONCE DAILY 07/16/17   Minna Merritts, MD  predniSONE (STERAPRED UNI-PAK 21 TAB) 5 MG (21) TBPK tablet Take 50mg  day1, 40mg  day 2, 30mg  day3, 20mg   Day 4, 10 mg day 5 11/09/17   Johnn Hai, PA-C  promethazine (PHENERGAN) 25 MG tablet Take 1 tablet (25 mg total) by mouth every 6 (six) hours as needed for nausea or vomiting. 11/09/17   Johnn Hai, PA-C    Allergies Patient has no known allergies.  Family History  Problem Relation Age of Onset  . Hypertension Father   . Thyroid disease Father   . AAA (abdominal aortic aneurysm) Father   . Hypertension Mother   . Hyperlipidemia Mother   . Diabetes Mother   . Kidney disease Mother   . Hypertension Sister   . Hypertension Sister   . Supraventricular tachycardia Sister   . Hypertension Brother   . Hypertension Brother   . Hypertension Daughter   . Cancer Paternal Grandfather        lung    Social History Social History   Tobacco Use  . Smoking status: Current Every Day Smoker    Packs/day: 0.25    Years: 30.00    Pack years: 7.50    Types: Cigarettes  . Smokeless tobacco: Never Used  Substance Use Topics  . Alcohol use: Yes  . Drug use: No    Types: Marijuana    Review of Systems Constitutional: No  fever/chills Cardiovascular: Denies chest pain. Respiratory: Denies shortness of breath. Gastrointestinal: No abdominal pain.  Positive nausea, no vomiting.  Musculoskeletal: Right foot pain. Skin: Positive for erythema right foot. Neurological: Negative for headaches, focal weakness or numbness. ____________________________________________   PHYSICAL EXAM:  VITAL SIGNS: ED Triage Vitals  Enc Vitals Group     BP 11/09/17 0925 (!) 181/106     Pulse Rate 11/09/17 0925 (!) 108     Resp 11/09/17 0925 16     Temp 11/09/17 0925 98.6 F (37 C)     Temp Source 11/09/17 0925 Oral     SpO2 11/09/17 0925 100 %     Weight 11/09/17 0924 165 lb (74.8 kg)  Height 11/09/17 0924 5\' 8"  (1.727 m)     Head Circumference --      Peak Flow --      Pain Score 11/09/17 0924 10     Pain Loc --      Pain Edu? --      Excl. in [place]? --    Constitutional: Alert and oriented. Well appearing and in no acute distress.  Patient is crying with her right foot elevated. Eyes: Conjunctivae are normal.  Head: Atraumatic. Neck: No stridor.   Cardiovascular: Normal rate, regular rhythm. Grossly normal heart sounds.  Good peripheral circulation. Respiratory: Normal respiratory effort.  No retractions. Lungs CTAB. Gastrointestinal: Soft and nontender. No distention.  Musculoskeletal: Right foot there is no gross deformity however there is marked tenderness to palpation of the dorsal aspect.  There is generalized erythema noted from the first MP joint to midline of her foot.  Soft tissue swelling is present.  Patient is able to move digits without any difficulty but increases her pain.  Nontender and no edema is noted of the ankle.  Skin is intact, pulse is present. Neurologic:  Normal speech and language. No gross focal neurologic deficits are appreciated.  Skin:  Skin is warm, dry and intact.  Psychiatric: Mood and affect are normal. Speech and behavior are normal.  ____________________________________________    LABS (all labs ordered are listed, but only abnormal results are displayed)  Labs Reviewed - No data to display  PROCEDURES  Procedure(s) performed: None  Procedures  Critical Care performed: No  ____________________________________________   INITIAL IMPRESSION / ASSESSMENT AND PLAN / ED COURSE  As part of my medical decision making, I reviewed the following data within the electronic MEDICAL RECORD NUMBER Notes from prior ED visits and [place] Controlled Substance Database  Patient was given Norco 1 tablet p.o. Zofran ODT 4 mg and Toradol 30 mg IM.  Patient is encouraged to follow-up with her PCP for recheck of her blood pressure which was elevated.  This may be due to the pain that she is experiencing at this time with her gout.  Prescription for colchicine and a prednisone pack was sent to [address] [address].  Patient is encouraged to elevate her foot as needed for swelling. ____________________________________________   FINAL CLINICAL IMPRESSION(S) / ED DIAGNOSES  Final diagnoses:  Acute gout of right foot, unspecified cause  Elevated blood pressure reading     ED Discharge Orders        Ordered    Colchicine (MITIGARE) 0.6 MG CAPS    Note to Pharmacy:  Generic colchicine not covered; Mitigare is apparently covered; thank you; I'm dosing just two instead of three b/c of renal status   11/09/17 1009    predniSONE (STERAPRED UNI-PAK 21 TAB) 5 MG (21) TBPK tablet     11/09/17 1009    promethazine (PHENERGAN) 25 MG tablet  Every 6 hours PRN     11/09/17 1009       Note:  This document was prepared using Dragon voice recognition software and may include unintentional dictation errors.    Johnn Hai, PA-C 11/09/17 [place], [place], MD 11/20/17 1325

## 2017-11-20 ENCOUNTER — Ambulatory Visit: Payer: Medicaid Other | Admitting: Family Medicine

## 2017-11-21 ENCOUNTER — Ambulatory Visit: Payer: Medicaid Other | Admitting: Family Medicine

## 2017-11-21 ENCOUNTER — Encounter: Payer: Self-pay | Admitting: Family Medicine

## 2017-11-21 VITALS — BP 122/86 | HR 70 | Temp 97.9°F | Resp 14 | Ht 67.0 in | Wt 177.0 lb

## 2017-11-21 DIAGNOSIS — I5022 Chronic systolic (congestive) heart failure: Secondary | ICD-10-CM

## 2017-11-21 DIAGNOSIS — F411 Generalized anxiety disorder: Secondary | ICD-10-CM | POA: Diagnosis not present

## 2017-11-21 DIAGNOSIS — Z23 Encounter for immunization: Secondary | ICD-10-CM

## 2017-11-21 DIAGNOSIS — Z5181 Encounter for therapeutic drug level monitoring: Secondary | ICD-10-CM

## 2017-11-21 DIAGNOSIS — E782 Mixed hyperlipidemia: Secondary | ICD-10-CM

## 2017-11-21 DIAGNOSIS — F3341 Major depressive disorder, recurrent, in partial remission: Secondary | ICD-10-CM | POA: Diagnosis not present

## 2017-11-21 DIAGNOSIS — I2510 Atherosclerotic heart disease of native coronary artery without angina pectoris: Secondary | ICD-10-CM

## 2017-11-21 DIAGNOSIS — M10371 Gout due to renal impairment, right ankle and foot: Secondary | ICD-10-CM

## 2017-11-21 DIAGNOSIS — G8929 Other chronic pain: Secondary | ICD-10-CM

## 2017-11-21 DIAGNOSIS — E038 Other specified hypothyroidism: Secondary | ICD-10-CM

## 2017-11-21 DIAGNOSIS — F339 Major depressive disorder, recurrent, unspecified: Secondary | ICD-10-CM

## 2017-11-21 DIAGNOSIS — M542 Cervicalgia: Secondary | ICD-10-CM

## 2017-11-21 DIAGNOSIS — N184 Chronic kidney disease, stage 4 (severe): Secondary | ICD-10-CM

## 2017-11-21 DIAGNOSIS — Z720 Tobacco use: Secondary | ICD-10-CM

## 2017-11-21 HISTORY — DX: Chronic systolic (congestive) heart failure: I50.22

## 2017-11-21 MED ORDER — GABAPENTIN 300 MG PO CAPS: 300.0000 mg | ORAL_CAPSULE | Freq: Two times a day (BID) | ORAL | 5 refills | Status: DC

## 2017-11-21 MED ORDER — PAROXETINE HCL 40 MG PO TABS: 40.0000 mg | ORAL_TABLET | Freq: Every day | ORAL | 5 refills | Status: DC

## 2017-11-21 NOTE — Patient Instructions (Addendum)
Try to follow the DASH guidelines (DASH stands for Dietary Approaches to Stop Hypertension). Try to limit the sodium in your diet to no more than 1,500mg  of sodium per day. Certainly try to not exceed 2,000 mg per day at the very most. Do not add salt when cooking or at the table.  Check the sodium amount on labels when shopping, and choose items lower in sodium when given a choice. Avoid or limit foods that already contain a lot of sodium. Eat a diet rich in fruits and vegetables and whole grains, and try to lose weight if overweight or obese   Gout Gout is painful swelling that can occur in some of your joints. Gout is a type of arthritis. This condition is caused by having too much uric acid in your body. Uric acid is a chemical that forms when your body breaks down substances called purines. Purines are important for building body proteins. When your body has too much uric acid, sharp crystals can form and build up inside your joints. This causes pain and swelling. Gout attacks can happen quickly and be very painful (acute gout). Over time, the attacks can affect more joints and become more frequent (chronic gout). Gout can also cause uric acid to build up under your skin and inside your kidneys. What are the causes? This condition is caused by too much uric acid in your blood. This can occur because:  Your kidneys do not remove enough uric acid from your blood. This is the most common cause.  Your body makes too much uric acid. This can occur with some cancers and cancer treatments. It can also occur if your body is breaking down too many red blood cells (hemolytic anemia).  You eat too many foods that are high in purines. These foods include organ meats and some seafood. Alcohol, especially beer, is also high in purines.  A gout attack may be triggered by trauma or stress. What increases the risk? This condition is more likely to develop in people who:  Have a family history of gout.  Are  female and middle-aged.  Are female and have gone through menopause.  Are obese.  Frequently drink alcohol, especially beer.  Are dehydrated.  Lose weight too quickly.  Have an organ transplant.  Have lead poisoning.  Take certain medicines, including aspirin, cyclosporine, diuretics, levodopa, and niacin.  Have kidney disease or psoriasis.  What are the signs or symptoms? An attack of acute gout happens quickly. It usually occurs in just one joint. The most common place is the big toe. Attacks often start at night. Other joints that may be affected include joints of the feet, ankle, knee, fingers, wrist, or elbow. Symptoms may include:  Severe pain.  Warmth.  Swelling.  Stiffness.  Tenderness. The affected joint may be very painful to touch.  Shiny, red, or purple skin.  Chills and fever.  Chronic gout may cause symptoms more frequently. More joints may be involved. You may also have white or yellow lumps (tophi) on your hands or feet or in other areas near your joints. How is this diagnosed? This condition is diagnosed based on your symptoms, medical history, and physical exam. You may have tests, such as:  Blood tests to measure uric acid levels.  Removal of joint fluid with a needle (aspiration) to look for uric acid crystals.  X-rays to look for joint damage.  How is this treated? Treatment for this condition has two phases: treating an acute attack and  preventing future attacks. Acute gout treatment may include medicines to reduce pain and swelling, including:  NSAIDs.  Steroids. These are strong anti-inflammatory medicines that can be taken by mouth (orally) or injected into a joint.  Colchicine. This medicine relieves pain and swelling when it is taken soon after an attack. It can be given orally or through an IV tube.  Preventive treatment may include:  Daily use of smaller doses of NSAIDs or colchicine.  Use of a medicine that reduces uric acid  levels in your blood.  Changes to your diet. You may need to see a specialist about healthy eating (dietitian).  Follow these instructions at home: During a Gout Attack  If directed, apply ice to the affected area: ? Put ice in a plastic bag. ? Place a towel between your skin and the bag. ? Leave the ice on for 20 minutes, 2-3 times a day.  Rest the joint as much as possible. If the affected joint is in your leg, you may be given crutches to use.  Raise (elevate) the affected joint above the level of your heart as often as possible.  Drink enough fluids to keep your urine clear or pale yellow.  Take over-the-counter and prescription medicines only as told by your health care provider.  Do not drive or operate heavy machinery while taking prescription pain medicine.  Follow instructions from your health care provider about eating or drinking restrictions.  Return to your normal activities as told by your health care provider. Ask your health care provider what activities are safe for you. Avoiding Future Gout Attacks  Follow a low-purine diet as told by your dietitian or health care provider. Avoid foods and drinks that are high in purines, including liver, kidney, anchovies, asparagus, herring, mushrooms, mussels, and beer.  Limit alcohol intake to no more than 1 drink a day for nonpregnant women and 2 drinks a day for men. One drink equals 12 oz of beer, 5 oz of wine, or 1 oz of hard liquor.  Maintain a healthy weight or lose weight if you are overweight. If you want to lose weight, talk with your health care provider. It is important that you do not lose weight too quickly.  Start or maintain an exercise program as told by your health care provider.  Drink enough fluids to keep your urine clear or pale yellow.  Take over-the-counter and prescription medicines only as told by your health care provider.  Keep all follow-up visits as told by your health care provider. This is  important. Contact a health care provider if:  You have another gout attack.  You continue to have symptoms of a gout attack after10 days of treatment.  You have side effects from your medicines.  You have chills or a fever.  You have burning pain when you urinate.  You have pain in your lower back or belly. Get help right away if:  You have severe or uncontrolled pain.  You cannot urinate. This information is not intended to replace advice given to you by your health care provider. Make sure you discuss any questions you have with your health care provider. Document Released: 03/29/2000 Document Revised: 09/07/2015 Document Reviewed: 01/12/2015 Elsevier Interactive Patient Education  [employer].  I do encourage you to quit smoking Call [phone] to sign up for smoking cessation classes You can call 1-800-QUIT-NOW to talk with a smoking cessation coach

## 2017-11-21 NOTE — Progress Notes (Signed)
BP 122/86   Pulse 70   Temp 97.9 F (36.6 C) (Oral)   Resp 14   Ht 5\' 7"  (1.702 m)   Wt 177 lb (80.3 kg)   SpO2 98%   BMI 27.72 kg/m    Subjective:    Patient ID: Angelica Herrera, female    DOB: 28-Aug-1970, 47 y.o.   MRN: [id number]  HPI: Angelica Herrera is a 47 y.o. female  Chief Complaint  Patient presents with  . Gout  . Follow-up  . Medication Refill    HPI Patient is here for follow-up, but has an acute issue going on  Gout She thinks it was from some alcohol, loves hamburgers and steaks; father and brother get gout; right great toe; hurts to walk; she was seen at [facility]; they gave her meds This is the 2nd time it was flared up; the first time, she was given colchicine; hospital gave her colchicine but there was a problem with prednisone Rx and was not filled, she couldn't get it  Hypertension Is supposed to be on rx lisinopril 5mg  daily and coreg 6.25mg  BID, but just taking 1/2 carvedilol once a day; not taking ACEi right now, just out of it for a month  Hyperlipidemia Is rx atorvastatin 40mg  tab but has been out of that for a little bit  History of MI & CAD and CHF Rx. 81mg  ASA and 75mg  plavix; not taking ASA anymore (samples given right away); she sees Dr. Rockey Situ for this; not on the statin right now; not weighing self daily; knows to avoid salt  Lab Results  Component Value Date   CHOL 109 11/06/2015   HDL 56 11/06/2015   LDLCALC 42 11/06/2015   TRIG 57 11/06/2015   CHOLHDL 1.9 11/06/2015    Hypothyroidism Takes synthroid 140mcg but missed about a week of medicine  Chronic kidney disease Was placed on ACE-i for nephroprotection; not taking. Seeing Dr. Holley Raring for that, but it's been awhile; rarely takes [facility] powders with Dr. Elwyn Lade blessing; tylenol does not help at all; last Cr reviewed  Anxiety Takes paxil 20mg ; definitely needs that; mostly anxiety; she gets so worked up over things; hardly ever leaves the home  Gout Had recent flare on  7/28 and started on colchicine; last flare was 08/20/2017  Spinal stenosis in the neck; degenerative discs in the neck and lower back Lots of pain in the neck; gabapentin helps with that pain; she does not want to take pain killers and does not want to see a pain clinic doctor; the gabapentin is enough for her  Depression screen [facility] 2/9 11/21/2017 08/20/2017 07/23/2016 01/25/2016 01/25/2016  Decreased Interest 3 0 0 3 0  Down, Depressed, Hopeless 2 0 1 3 0  PHQ - 2 Score 5 0 1 6 0  Altered sleeping 3 - - 3 -  Tired, decreased energy 3 - - 3 -  Change in appetite 3 - - 3 -  Feeling bad or failure about yourself  3 - - 3 -  Trouble concentrating 3 - - 1 -  Moving slowly or fidgety/restless 3 - - 1 -  Suicidal thoughts 0 - - 0 -  PHQ-9 Score 23 - - 20 -  Difficult doing work/chores Somewhat difficult - - - -    Relevant past medical, surgical, family and social history reviewed Past Medical History:  Diagnosis Date  . Arthritis    gout  . CAD (coronary artery disease)    a. 02/2012 s/p  anterolateral STEMI/Cath/PCI: LM 100 (3.0x15 DES ), LAD sm, severely diseased (PTCA w/ 1.59mm balloon), LCX nl, RI moderate size, nl, RCA 30-16m, AM/PDA/PLA nl, R->L collats noted; b. cath 08/03/15: widely patent ostLM stent at 10%, mLAD 60%,  mRCA 30%, EF 35-40%, ant wall HK, mildly elevated LVEDP  . CKD (chronic kidney disease), stage IV ([place])   . HTN (hypertension)   . Hyperlipidemia   . Hypothyroidism   . Ischemic cardiomyopathy    a. 02/2012 Echo: EF 40-45%, ant/lat HK, Gr1 DD, Mild MR, PASP 32mmHg.  Marland Kitchen NSVT (nonsustained ventricular tachycardia) ([place])    a. 02/2012 post-MI  . Obesity   . Tobacco abuse    Past Surgical History:  Procedure Laterality Date  . ABDOMINAL HYSTERECTOMY    . CARDIAC CATHETERIZATION  2013   [facility] s/p stent  . CARDIAC CATHETERIZATION Left 08/03/2015   Procedure: Left Heart Cath and Coronary Angiography;  Surgeon: Wellington Hampshire, MD;  Location: [place] CV [facility];  Service:  Cardiovascular;  Laterality: Left;  . DILATION AND CURETTAGE OF UTERUS    . LEFT HEART CATHETERIZATION WITH CORONARY ANGIOGRAM N/A 02/11/2012   Procedure: LEFT HEART CATHETERIZATION WITH CORONARY ANGIOGRAM;  Surgeon: [name], MD;  Location: [facility] CATH LAB;  Service: Cardiovascular;  Laterality: N/A;   Family History  Problem Relation Age of Onset  . Hypertension Father   . Thyroid disease Father   . AAA (abdominal aortic aneurysm) Father   . Hypertension Mother   . Hyperlipidemia Mother   . Diabetes Mother   . Kidney disease Mother   . Hypertension Sister   . Hypertension Sister   . Supraventricular tachycardia Sister   . Hypertension Brother   . Hypertension Brother   . Hypertension Daughter   . Cancer Paternal Grandfather        lung   Social History   Tobacco Use  . Smoking status: Current Every Day Smoker    Packs/day: 0.25    Years: 30.00    Pack years: 7.50    Types: Cigarettes  . Smokeless tobacco: Never Used  Substance Use Topics  . Alcohol use: Yes  . Drug use: No    Types: Marijuana    Interim medical history since last visit reviewed. Allergies and medications reviewed  Review of Systems Per HPI unless specifically indicated above     Objective:    BP 122/86   Pulse 70   Temp 97.9 F (36.6 C) (Oral)   Resp 14   Ht 5\' 7"  (1.702 m)   Wt 177 lb (80.3 kg)   SpO2 98%   BMI 27.72 kg/m   Wt Readings from Last 3 Encounters:  11/21/17 177 lb (80.3 kg)  11/09/17 165 lb (74.8 kg)  08/20/17 165 lb (74.8 kg)    Physical Exam  Constitutional: She appears well-developed and well-nourished. No distress.  HENT:  Head: Normocephalic and atraumatic.  Eyes: EOM are normal. No scleral icterus.  Neck: No thyromegaly present.  Cardiovascular: Normal rate, regular rhythm and normal heart sounds.  No murmur heard. Pulmonary/Chest: Effort normal and breath sounds normal. No respiratory distress. She has no wheezes.  Abdominal: Soft. Bowel sounds are normal.  She exhibits no distension.  Musculoskeletal: She exhibits no edema.  Neurological: She is alert.  Skin: Skin is warm and dry. She is not diaphoretic. No pallor.  Psychiatric: She has a normal mood and affect. Her behavior is normal. Judgment and thought content normal.    Results for orders placed or performed in  visit on 08/20/17  Uric acid  Result Value Ref Range   Uric Acid, Serum 10.1 (H) 2.5 - 7.0 mg/dL  BASIC METABOLIC PANEL WITH GFR  Result Value Ref Range   Glucose, Bld 77 65 - 99 mg/dL   BUN 25 7 - 25 mg/dL   Creat 1.38 (H) 0.50 - 1.10 mg/dL   GFR, Est Non African American 45 (L) > OR = 60 mL/min/1.64m2   GFR, Est African American 52 (L) > OR = 60 mL/min/1.63m2   BUN/Creatinine Ratio 18 6 - 22 (calc)   Sodium 139 135 - 146 mmol/L   Potassium 4.2 3.5 - 5.3 mmol/L   Chloride 104 98 - 110 mmol/L   CO2 23 20 - 32 mmol/L   Calcium 10.2 8.6 - 10.2 mg/dL      Assessment & Plan:   Problem List Items Addressed This Visit      Cardiovascular and Mediastinum   Coronary artery disease involving native coronary artery    Pt to f/u with cardiologist      Chronic systolic heart failure ([place]) - Primary (Chronic)    Pt to f/u with cardiologist;s he appears euvolemic today        Endocrine   Hypothyroidism   Relevant Orders   TSH     Genitourinary   CKD (chronic kidney disease), stage IV (HCC)    Avoid NSAIDs; use limited colchicine; hydrate; encouraged smoking cessation        Other   Hyperlipidemia   Relevant Orders   Lipid panel   GAD (generalized anxiety disorder)   Relevant Medications   PARoxetine (PAXIL) 40 MG tablet   Tobacco abuse    Patient unfortunately does not want to quit smoking      Depression, recurrent ([place])    SSRI; reasons to call or seek med attention reviewed      Relevant Medications   PARoxetine (PAXIL) 40 MG tablet    Other Visit Diagnoses    Recurrent major depressive disorder, in partial remission (HCC)       Relevant  Medications   PARoxetine (PAXIL) 40 MG tablet   Need for diphtheria-tetanus-pertussis (Tdap) vaccine       Chronic neck pain       Relevant Medications   PARoxetine (PAXIL) 40 MG tablet   gabapentin (NEURONTIN) 300 MG capsule   Medication monitoring encounter       Relevant Orders   CBC with Differential/Platelet   COMPLETE METABOLIC PANEL WITH GFR   Acute gout due to renal impairment involving toe of right foot       Relevant Orders   Uric acid      Labs ordered, pending Follow up plan: Return in about 6 months (around 05/24/2018).  An after-visit summary was printed and given to the patient at [facility].  Please see the patient instructions which may contain other information and recommendations beyond what is mentioned above in the assessment and plan.  Meds ordered this encounter  Medications  . PARoxetine (PAXIL) 40 MG tablet    Sig: Take 1 tablet (40 mg total) by mouth daily. Appointment needed for any further refills    Dispense:  30 tablet    Refill:  5  . gabapentin (NEURONTIN) 300 MG capsule    Sig: Take 1 capsule (300 mg total) by mouth 2 (two) times daily.    Dispense:  60 capsule    Refill:  5    Cancel the once at bedtime Rx from [facility]  Orders Placed This Encounter  Procedures  . CBC with Differential/Platelet  . COMPLETE METABOLIC PANEL WITH GFR  . Lipid panel  . TSH  . Uric acid

## 2017-11-26 ENCOUNTER — Telehealth: Payer: Self-pay

## 2017-11-26 MED ORDER — COLCHICINE 0.6 MG PO CAPS: ORAL_CAPSULE | ORAL | 0 refills | Status: DC

## 2017-11-26 NOTE — Telephone Encounter (Signed)
Copied from [place] [id number]. Topic: Quick Communication - Rx Refill/Question >> Nov 26, 2017 10:19 AM Carolyn Stare wrote: Pt said he gout has flared up and is asking for the below med   Medication Colchicine (MITIGARE) 0.6 MG CAPS    Preferred Pharmacy  [facility]   Agent: Please be advised that RX refills may take up to 3 business days. We ask that you follow-up with your pharmacy.

## 2017-11-26 NOTE — Telephone Encounter (Signed)
Pt.notified

## 2017-11-26 NOTE — Telephone Encounter (Signed)
Refill request requested for colchicine I don't see the lab results that I just ordered last week I'm going to approve just three pills of the colchicine; we really need those labs If pain not resolve with this round, she needs an appointment or go to urgent care Tell her to NOT take any atorvastatin for three days after taking the mitagare (colchicine)

## 2017-12-02 DIAGNOSIS — F339 Major depressive disorder, recurrent, unspecified: Secondary | ICD-10-CM

## 2017-12-02 HISTORY — DX: Major depressive disorder, recurrent, unspecified: F33.9

## 2017-12-02 NOTE — Assessment & Plan Note (Addendum)
Pt to f/u with cardiologist;s he appears euvolemic today

## 2017-12-02 NOTE — Assessment & Plan Note (Signed)
Pt to f/u with cardiologist

## 2017-12-02 NOTE — Assessment & Plan Note (Addendum)
SSRI; reasons to call or seek med attention reviewed

## 2017-12-02 NOTE — Assessment & Plan Note (Signed)
Patient unfortunately does not want to quit smoking

## 2017-12-02 NOTE — Assessment & Plan Note (Signed)
>>  ASSESSMENT AND PLAN FOR CKD (CHRONIC KIDNEY DISEASE), STAGE IV (HCC) WRITTEN ON 12/02/2017 10:18 AM BY LADA, MELINDA P, MD  Avoid NSAIDs; use limited colchicine ; hydrate; encouraged smoking cessation

## 2017-12-02 NOTE — Assessment & Plan Note (Signed)
Avoid NSAIDs; use limited colchicine; hydrate; encouraged smoking cessation

## 2017-12-12 ENCOUNTER — Other Ambulatory Visit: Payer: Self-pay | Admitting: Cardiovascular Disease

## 2018-01-05 ENCOUNTER — Other Ambulatory Visit: Payer: Self-pay

## 2018-01-05 ENCOUNTER — Emergency Department: Payer: Medicaid Other

## 2018-01-05 ENCOUNTER — Encounter: Payer: Self-pay | Admitting: Emergency Medicine

## 2018-01-05 ENCOUNTER — Emergency Department
Admission: EM | Admit: 2018-01-05 | Discharge: 2018-01-05 | Disposition: A | Discharge status: Home / Self Care | Payer: Medicaid Other | Attending: Emergency Medicine | Admitting: Emergency Medicine

## 2018-01-05 DIAGNOSIS — I255 Ischemic cardiomyopathy: Secondary | ICD-10-CM | POA: Insufficient documentation

## 2018-01-05 DIAGNOSIS — Y998 Other external cause status: Secondary | ICD-10-CM | POA: Insufficient documentation

## 2018-01-05 DIAGNOSIS — Y9389 Activity, other specified: Secondary | ICD-10-CM | POA: Insufficient documentation

## 2018-01-05 DIAGNOSIS — T148XXA Other injury of unspecified body region, initial encounter: Secondary | ICD-10-CM

## 2018-01-05 DIAGNOSIS — N184 Chronic kidney disease, stage 4 (severe): Secondary | ICD-10-CM | POA: Diagnosis not present

## 2018-01-05 DIAGNOSIS — I252 Old myocardial infarction: Secondary | ICD-10-CM | POA: Diagnosis not present

## 2018-01-05 DIAGNOSIS — I2510 Atherosclerotic heart disease of native coronary artery without angina pectoris: Secondary | ICD-10-CM | POA: Diagnosis not present

## 2018-01-05 DIAGNOSIS — S0083XA Contusion of other part of head, initial encounter: Secondary | ICD-10-CM | POA: Diagnosis not present

## 2018-01-05 DIAGNOSIS — E039 Hypothyroidism, unspecified: Secondary | ICD-10-CM | POA: Insufficient documentation

## 2018-01-05 DIAGNOSIS — Y9289 Other specified places as the place of occurrence of the external cause: Secondary | ICD-10-CM | POA: Diagnosis not present

## 2018-01-05 DIAGNOSIS — E785 Hyperlipidemia, unspecified: Secondary | ICD-10-CM | POA: Insufficient documentation

## 2018-01-05 DIAGNOSIS — Z79899 Other long term (current) drug therapy: Secondary | ICD-10-CM | POA: Insufficient documentation

## 2018-01-05 DIAGNOSIS — I130 Hypertensive heart and chronic kidney disease with heart failure and stage 1 through stage 4 chronic kidney disease, or unspecified chronic kidney disease: Secondary | ICD-10-CM | POA: Diagnosis not present

## 2018-01-05 DIAGNOSIS — W010XXA Fall on same level from slipping, tripping and stumbling without subsequent striking against object, initial encounter: Secondary | ICD-10-CM | POA: Insufficient documentation

## 2018-01-05 DIAGNOSIS — S0990XA Unspecified injury of head, initial encounter: Secondary | ICD-10-CM

## 2018-01-05 DIAGNOSIS — F17210 Nicotine dependence, cigarettes, uncomplicated: Secondary | ICD-10-CM | POA: Insufficient documentation

## 2018-01-05 DIAGNOSIS — I5022 Chronic systolic (congestive) heart failure: Secondary | ICD-10-CM | POA: Diagnosis not present

## 2018-01-05 DIAGNOSIS — S0510XA Contusion of eyeball and orbital tissues, unspecified eye, initial encounter: Secondary | ICD-10-CM

## 2018-01-05 DIAGNOSIS — Z7902 Long term (current) use of antithrombotics/antiplatelets: Secondary | ICD-10-CM | POA: Insufficient documentation

## 2018-01-05 NOTE — Discharge Instructions (Addendum)
Please take Tylenol as needed for discomfort, as written on the box.  Please follow-up with your primary care doctor in the next several days for recheck/reevaluation.  Return to the emergency department for any worsening headache, any confusion, any weakness or numbness of any arm or leg, or any other symptom personally concerning to yourself.

## 2018-01-05 NOTE — ED Notes (Signed)
Patient transported to CT 
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
## 2018-02-03 ENCOUNTER — Other Ambulatory Visit: Payer: Self-pay | Admitting: *Deleted

## 2018-02-03 ENCOUNTER — Telehealth: Payer: Self-pay

## 2018-02-03 MED ORDER — FUROSEMIDE 20 MG PO TABS: 20.0000 mg | ORAL_TABLET | Freq: Every day | ORAL | 0 refills | Status: DC

## 2018-02-03 NOTE — Telephone Encounter (Signed)
Patient scheduled for 02/27/18

## 2018-02-03 NOTE — Telephone Encounter (Signed)
Pt needs to schedule f/u appointment OD 6 month f/u LS 11/18.

## 2018-02-03 NOTE — Telephone Encounter (Signed)
*  STAT* If patient is at the pharmacy, call can be transferred to refill team.   1. Which medications need to be refilled? (please list name of each medication and dose if known) FUROSEMIDE 20 MG 2. Which pharmacy/location (including street and city if local pharmacy) is medication to be sent to? [facility]  3. Do they need a 30 day or 90 day supply? [place]

## 2018-02-03 NOTE — Telephone Encounter (Signed)
Requested Prescriptions   Signed Prescriptions Disp Refills  . furosemide (LASIX) 20 MG tablet 30 tablet 0    Sig: Take 1 tablet (20 mg total) by mouth daily.    Authorizing Provider: Kathlyn [place] A    Ordering User: Britt Bottom

## 2018-02-22 ENCOUNTER — Emergency Department: Payer: Medicaid Other

## 2018-02-22 ENCOUNTER — Emergency Department
Admission: EM | Admit: 2018-02-22 | Discharge: 2018-02-22 | Disposition: A | Discharge status: Home / Self Care | Payer: Medicaid Other | Attending: Emergency Medicine | Admitting: Emergency Medicine

## 2018-02-22 ENCOUNTER — Encounter: Payer: Self-pay | Admitting: Emergency Medicine

## 2018-02-22 ENCOUNTER — Other Ambulatory Visit: Payer: Self-pay

## 2018-02-22 DIAGNOSIS — Z7902 Long term (current) use of antithrombotics/antiplatelets: Secondary | ICD-10-CM | POA: Insufficient documentation

## 2018-02-22 DIAGNOSIS — M79672 Pain in left foot: Secondary | ICD-10-CM | POA: Diagnosis present

## 2018-02-22 DIAGNOSIS — M10072 Idiopathic gout, left ankle and foot: Secondary | ICD-10-CM | POA: Diagnosis not present

## 2018-02-22 DIAGNOSIS — I5022 Chronic systolic (congestive) heart failure: Secondary | ICD-10-CM | POA: Diagnosis not present

## 2018-02-22 DIAGNOSIS — F17210 Nicotine dependence, cigarettes, uncomplicated: Secondary | ICD-10-CM | POA: Diagnosis not present

## 2018-02-22 DIAGNOSIS — E039 Hypothyroidism, unspecified: Secondary | ICD-10-CM | POA: Insufficient documentation

## 2018-02-22 DIAGNOSIS — I130 Hypertensive heart and chronic kidney disease with heart failure and stage 1 through stage 4 chronic kidney disease, or unspecified chronic kidney disease: Secondary | ICD-10-CM | POA: Diagnosis not present

## 2018-02-22 DIAGNOSIS — M109 Gout, unspecified: Secondary | ICD-10-CM

## 2018-02-22 DIAGNOSIS — I2510 Atherosclerotic heart disease of native coronary artery without angina pectoris: Secondary | ICD-10-CM | POA: Diagnosis not present

## 2018-02-22 DIAGNOSIS — B070 Plantar wart: Secondary | ICD-10-CM | POA: Diagnosis not present

## 2018-02-22 DIAGNOSIS — Z79899 Other long term (current) drug therapy: Secondary | ICD-10-CM | POA: Insufficient documentation

## 2018-02-22 DIAGNOSIS — N184 Chronic kidney disease, stage 4 (severe): Secondary | ICD-10-CM | POA: Diagnosis not present

## 2018-02-22 DIAGNOSIS — R030 Elevated blood-pressure reading, without diagnosis of hypertension: Secondary | ICD-10-CM

## 2018-02-22 MED ORDER — METHYLPREDNISOLONE SODIUM SUCC 125 MG IJ SOLR
125.0000 mg | Freq: Once | INTRAMUSCULAR | Status: AC
  Administered 2018-02-22: 125 mg via INTRAMUSCULAR
  Filled 2018-02-22: qty 2

## 2018-02-22 MED ORDER — COLCHICINE 0.6 MG PO CAPS: ORAL_CAPSULE | ORAL | 0 refills | Status: DC

## 2018-02-22 MED ORDER — SALICYLIC ACID 26 % EX SOLN: 1.0000 [drp] | Freq: Every day | CUTANEOUS | 0 refills | Status: DC

## 2018-02-22 MED ORDER — PREDNISONE 10 MG PO TABS: ORAL_TABLET | ORAL | 0 refills | Status: DC

## 2018-02-22 MED ORDER — OXYCODONE-ACETAMINOPHEN 5-325 MG PO TABS
1.0000 | ORAL_TABLET | Freq: Once | ORAL | Status: AC
  Administered 2018-02-22: 1 via ORAL
  Filled 2018-02-22: qty 1

## 2018-02-22 MED ORDER — KETOROLAC TROMETHAMINE 30 MG/ML IJ SOLN
30.0000 mg | Freq: Once | INTRAMUSCULAR | Status: AC
  Administered 2018-02-22: 30 mg via INTRAMUSCULAR
  Filled 2018-02-22: qty 1

## 2018-02-22 NOTE — ED Notes (Signed)
Pain with redness to left foot. patient report being treated previously for gout, believes to be having another gout flair up. Unable to bare weight on left foot.

## 2018-02-22 NOTE — ED Notes (Signed)
IM meds given as ordered. Patient tolerated well. Awaiting discharge.

## 2018-02-22 NOTE — Discharge Instructions (Signed)
Your blood pressure was elevated in the emergency department.  Please follow-up with primary care for blood pressure recheck.

## 2018-02-22 NOTE — ED Notes (Signed)
Po meds given for left foot pain 10/10. Bedside xray completed. awaiting results and disposition.

## 2018-02-22 NOTE — ED Provider Notes (Signed)
[facility] Emergency Department Provider Note  ____________________________________________  Time seen: Approximately 10:25 AM  I have reviewed the triage vital signs and the nursing notes.   HISTORY  Chief Complaint Foot Pain    HPI Angelica Herrera is a 47 y.o. female that presents emergency department for evaluation of left foot pain at the base of her great toe for 1 day.  Foot is painful to bear weight.  She also has a painful spot at the bottom of her foot.  [place] without shoes frequently.  Patient states that she has a history of gout and top of the foot feels the same.  She usually gets gout in her right foot.  No trauma.  Past Medical History:  Diagnosis Date  . Arthritis    gout  . CAD (coronary artery disease)    a. 02/2012 s/p anterolateral STEMI/Cath/PCI: LM 100 (3.0x15 DES ), LAD sm, severely diseased (PTCA w/ 1.101mm balloon), LCX nl, RI moderate size, nl, RCA 30-56m, AM/PDA/PLA nl, R->L collats noted; b. cath 08/03/15: widely patent ostLM stent at 10%, mLAD 60%,  mRCA 30%, EF 35-40%, ant wall HK, mildly elevated LVEDP  . CKD (chronic kidney disease), stage IV ([place])   . HTN (hypertension)   . Hyperlipidemia   . Hypothyroidism   . Ischemic cardiomyopathy    a. 02/2012 Echo: EF 40-45%, ant/lat HK, Gr1 DD, Mild MR, PASP 71mmHg.  Marland Kitchen NSVT (nonsustained ventricular tachycardia) ([place])    a. 02/2012 post-MI  . Obesity   . Tobacco abuse     Patient Active Problem List   Diagnosis Date Noted  . Depression, recurrent ([place]) 12/02/2017  . Chronic systolic heart failure ([place]) 11/21/2017  . Controlled substance agreement signed 09/07/2015  . Coronary artery disease involving native coronary artery   . Allergic rhinitis due to pollen 06/28/2015  . GAD (generalized anxiety disorder) 03/17/2015  . Degenerative disc disease, cervical 03/17/2015  . Hyperlipidemia   . Obesity   . CKD (chronic  kidney disease), stage IV ([place])   . Ischemic cardiomyopathy   . NSVT (nonsustained ventricular tachycardia) ([place])   . Hypothyroidism   . History of acute anterior wall MI 02/12/2012  . Essential hypertension, malignant 02/12/2012  . Tobacco abuse 02/12/2012    Past Surgical History:  Procedure Laterality Date  . ABDOMINAL HYSTERECTOMY    . CARDIAC CATHETERIZATION  2013   [facility] s/p stent  . CARDIAC CATHETERIZATION Left 08/03/2015   Procedure: Left Heart Cath and Coronary Angiography;  Surgeon: Wellington Hampshire, MD;  Location: [facility];  Service: Cardiovascular;  Laterality: Left;  . DILATION AND CURETTAGE OF UTERUS    . LEFT HEART CATHETERIZATION WITH CORONARY ANGIOGRAM N/A 02/11/2012   Procedure: LEFT HEART CATHETERIZATION WITH CORONARY ANGIOGRAM;  Surgeon: [name], MD;  Location: [facility];  Service: Cardiovascular;  Laterality: N/A;    Prior to Admission medications   Medication Sig Start Date End Date Taking? Authorizing Provider  aspirin 81 MG tablet Take 1 tablet (81 mg total) by mouth daily. Patient not taking: Reported on 01/05/2018 02/15/12   Theora Gianotti, NP  atorvastatin (LIPITOR) 40 MG tablet TAKE 1 TABLET AT BEDTIME NIGHTLY FOR CHOLESTEROL Patient taking differently: Take 40 mg by mouth at bedtime.  07/16/17   Minna Merritts, MD  carvedilol (COREG) 6.25 MG tablet Take 1 tablet (6.25 mg total) by mouth 2 (two) times daily. 02/11/17   [name]  M, PA-C  clopidogrel (PLAVIX) 75 MG tablet TAKE ONE (1) TABLET BY MOUTH ONCE DAILY Patient taking differently: Take 75 mg by mouth daily.  02/27/17   Wellington Hampshire, MD  Colchicine (MITIGARE) 0.6 MG CAPS Two tablets by mouth once, then one pill one hour later; then one pill per day until flare resolves. No atorvastatin during medication 02/22/18   Laban Emperor, PA-C  furosemide (LASIX) 20 MG tablet Take 1 tablet (20 mg total) by mouth daily. 02/03/18   Wellington Hampshire, MD  gabapentin (NEURONTIN)  300 MG capsule Take 1 capsule (300 mg total) by mouth 2 (two) times daily. 11/21/17   Lada, Satira Anis, MD  levothyroxine (SYNTHROID, LEVOTHROID) 112 MCG tablet TAKE 1 TABLET ON AN EMPTY STOMACH WITH A GLASS OF WATER AT LEAST 30 TO 60 MINUTES BEFORE BREAKFAST. Patient taking differently: Take 112 mcg by mouth daily before breakfast.  03/01/17   Lada, Satira Anis, MD  lisinopril (PRINIVIL,ZESTRIL) 10 MG tablet TAKE ONE (1) TABLET BY MOUTH ONCE DAILY Patient not taking: Reported on 11/21/2017 07/16/17   [name], PA-C  magnesium oxide (MAG-OX) 400 MG tablet Take 1 tablet (400 mg total) by mouth daily. Patient not taking: Reported on 11/21/2017 05/09/16   [name], PA-C  nitroGLYCERIN (NITROSTAT) 0.4 MG SL tablet Place 1 tablet (0.4 mg total) under the tongue every 5 (five) minutes x 3 doses as needed for chest pain. 02/15/12   Theora Gianotti, NP  PARoxetine (PAXIL) 40 MG tablet Take 1 tablet (40 mg total) by mouth daily. Appointment needed for any further refills 11/21/17   Lada, Satira Anis, MD  predniSONE (DELTASONE) 10 MG tablet Take 6 tablets day 1, take 5 tablets day 2, take 4 tablets day 3, take 3 tablets day 4, take 2 tablets day 5, take 1 tablet day 6 02/22/18   Laban Emperor, PA-C  promethazine (PHENERGAN) 25 MG tablet Take 1 tablet (25 mg total) by mouth every 6 (six) hours as needed for nausea or vomiting. 11/09/17   Johnn Hai, PA-C  Salicylic Acid 26 % SOLN Apply 1 drop topically daily. Soak wart in warm water for 5 min and dry foot prior to medication 02/22/18   Laban Emperor, PA-C    Allergies Patient has no known allergies.  Family History  Problem Relation Age of Onset  . Hypertension Father   . Thyroid disease Father   . AAA (abdominal aortic aneurysm) Father   . Hypertension Mother   . Hyperlipidemia Mother   . Diabetes Mother   . Kidney disease Mother   . Hypertension Sister   . Hypertension Sister   . Supraventricular tachycardia Sister   . Hypertension  Brother   . Hypertension Brother   . Hypertension Daughter   . Cancer Paternal Grandfather        lung    Social History Social History   Tobacco Use  . Smoking status: Current Every Day Smoker    Packs/day: 0.25    Years: 30.00    Pack years: 7.50    Types: Cigarettes  . Smokeless tobacco: Never Used  Substance Use Topics  . Alcohol use: Yes  . Drug use: No    Types: Marijuana     Review of Systems  Respiratory: No SOB. Gastrointestinal: No nausea, no vomiting.  Musculoskeletal: Positive for foot pain. Skin: Negative for  abrasions, lacerations, ecchymosis. Neurological: Negative for headaches, numbness or tingling   ____________________________________________   PHYSICAL EXAM:  VITAL SIGNS: ED  Triage Vitals  Enc Vitals Group     BP 02/22/18 1013 (!) 171/102     Pulse Rate 02/22/18 1013 80     Resp 02/22/18 1013 18     Temp 02/22/18 1013 97.8 F (36.6 C)     Temp Source 02/22/18 1013 Oral     SpO2 02/22/18 1013 97 %     Weight 02/22/18 1011 169 lb 15.6 oz (77.1 kg)     Height --      Head Circumference --      Peak Flow --      Pain Score 02/22/18 1011 10     Pain Loc --      Pain Edu? --      Excl. in [place]? --      Constitutional: Alert and oriented. Well appearing and in no acute distress. Eyes: Conjunctivae are normal. PERRL. EOMI. Head: Atraumatic. ENT:      Ears:      Nose: No congestion/rhinnorhea.      Mouth/Throat: Mucous membranes are moist.  Neck: No stridor.   Cardiovascular: Normal rate, regular rhythm.  Good peripheral circulation. Respiratory: Normal respiratory effort without tachypnea or retractions. Lungs CTAB. Good air entry to the bases with no decreased or absent breath sounds. Musculoskeletal: Full range of motion to all extremities. No gross deformities appreciated.  Tenderness to palpation and erythema of the distal foot at the base of the right toe.  1 mm x 1 mm thick rough skin to the ball of left foot. Neurologic:  Normal  speech and language. No gross focal neurologic deficits are appreciated.  Skin:  Skin is warm, dry and intact. No rash noted. Psychiatric: Mood and affect are normal. Speech and behavior are normal. Patient exhibits appropriate insight and judgement.   ____________________________________________   LABS (all labs ordered are listed, but only abnormal results are displayed)  Labs Reviewed - No data to display ____________________________________________  EKG   ____________________________________________  RADIOLOGY Robinette Haines, personally viewed and evaluated these images (plain radiographs) as part of my medical decision making, as well as reviewing the written report by the radiologist.  Dg Foot Complete Left  Result Date: 02/22/2018 CLINICAL DATA:  Pain with redness to left foot. Patient states hx of gout and that this feels the same. Patient unable to dorsiflex foot due to pain. EXAM: LEFT FOOT - COMPLETE 3+ VIEW COMPARISON:  None. FINDINGS: No fracture or bone lesion. The joints are normally spaced and aligned. No arthropathic changes. Small plantar calcaneal spur. Soft tissues are unremarkable. IMPRESSION: 1. No fracture, bone lesion or joint abnormality. Electronically Signed   By: Lajean Manes M.D.   On: 02/22/2018 11:08    ____________________________________________    PROCEDURES  Procedure(s) performed:    Procedures    Medications  oxyCODONE-acetaminophen (PERCOCET/ROXICET) 5-325 MG per tablet 1 tablet (1 tablet Oral Given 02/22/18 1056)  methylPREDNISolone sodium succinate (SOLU-MEDROL) 125 mg/2 mL injection 125 mg (125 mg Intramuscular Given 02/22/18 1135)  ketorolac (TORADOL) 30 MG/ML injection 30 mg (30 mg Intramuscular Given 02/22/18 1135)     ____________________________________________   INITIAL IMPRESSION / ASSESSMENT AND PLAN / ED COURSE  Pertinent labs & imaging results that were available during my care of the patient were reviewed by me  and considered in my medical decision making (see chart for details).  Review of the [place] CSRS was performed in accordance of the [place] prior to dispensing any controlled drugs.   Patient's diagnosis is consistent with gout and plantar  wart.  Vital signs and exam are reassuring.   No acute bony abnormalities on x-ray.  IM Toradol, IM Solu-Medrol, oral Percocet was given.  Patient will be discharged home with prescriptions for prednisone, colchicine, salicylic acid.. Patient is to follow up with podiatry as directed. Patient is given ED precautions to return to the ED for any worsening or new symptoms.     ____________________________________________  FINAL CLINICAL IMPRESSION(S) / ED DIAGNOSES  Final diagnoses:  Acute gout of left foot, unspecified cause  Plantar wart  Elevated blood pressure reading      NEW MEDICATIONS STARTED DURING THIS VISIT:  ED Discharge Orders         Ordered    Colchicine (MITIGARE) 0.6 MG CAPS    Note to Pharmacy:  Generic colchicine not covered; Mitigare is apparently covered; thank you   02/22/18 1143    predniSONE (DELTASONE) 10 MG tablet     74/16/38 4536    Salicylic Acid 26 % SOLN  Daily     02/22/18 1145              This chart was dictated using voice recognition software/Dragon. Despite best efforts to proofread, errors can occur which can change the meaning. Any change was purely unintentional.    Laban Emperor, PA-C 02/22/18 [place], Randall An, MD 02/22/18 [phone]

## 2018-02-22 NOTE — ED Triage Notes (Signed)
C/O gout pain to left foot x 2 days.  Arrives via [facility]
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
## 2018-02-27 ENCOUNTER — Telehealth: Payer: Self-pay

## 2018-02-27 ENCOUNTER — Encounter: Payer: Self-pay | Admitting: Physician Assistant

## 2018-02-27 ENCOUNTER — Ambulatory Visit (INDEPENDENT_AMBULATORY_CARE_PROVIDER_SITE_OTHER): Payer: Medicaid Other | Admitting: Physician Assistant

## 2018-02-27 VITALS — BP 118/78 | HR 67 | Ht 67.0 in | Wt 165.0 lb

## 2018-02-27 DIAGNOSIS — I2510 Atherosclerotic heart disease of native coronary artery without angina pectoris: Secondary | ICD-10-CM

## 2018-02-27 DIAGNOSIS — Z5181 Encounter for therapeutic drug level monitoring: Secondary | ICD-10-CM

## 2018-02-27 DIAGNOSIS — I255 Ischemic cardiomyopathy: Secondary | ICD-10-CM | POA: Diagnosis not present

## 2018-02-27 DIAGNOSIS — E038 Other specified hypothyroidism: Secondary | ICD-10-CM

## 2018-02-27 DIAGNOSIS — I5022 Chronic systolic (congestive) heart failure: Secondary | ICD-10-CM

## 2018-02-27 DIAGNOSIS — I10 Essential (primary) hypertension: Secondary | ICD-10-CM | POA: Diagnosis not present

## 2018-02-27 DIAGNOSIS — M10371 Gout due to renal impairment, right ankle and foot: Secondary | ICD-10-CM

## 2018-02-27 DIAGNOSIS — E785 Hyperlipidemia, unspecified: Secondary | ICD-10-CM

## 2018-02-27 DIAGNOSIS — N184 Chronic kidney disease, stage 4 (severe): Secondary | ICD-10-CM

## 2018-02-27 DIAGNOSIS — Z720 Tobacco use: Secondary | ICD-10-CM

## 2018-02-27 MED ORDER — FUROSEMIDE 20 MG PO TABS: 20.0000 mg | ORAL_TABLET | Freq: Every day | ORAL | 3 refills | Status: DC

## 2018-02-27 MED ORDER — METOPROLOL SUCCINATE ER 25 MG PO TB24: 12.5000 mg | ORAL_TABLET | Freq: Every day | ORAL | 3 refills | Status: DC

## 2018-02-27 NOTE — Patient Instructions (Signed)
Medication Instructions:  Your physician has recommended you make the following change in your medication:  1- STOP Coreg 2- Start Toprol (metoprolol) 12.5 mg (1/2 tablet) once daily.    If you need a refill on your cardiac medications before your next appointment, please call your pharmacy.   Lab work: Your physician recommends that you return for lab work today (CMET, Lipid, CBC)  If you have labs (blood work) drawn today and your tests are completely normal, you will receive your results only by: Marland Kitchen MyChart Message (if you have MyChart) OR . A paper copy in the mail If you have any lab test that is abnormal or we need to change your treatment, we will call you to review the results.  Testing/Procedures: None   Follow-Up: At [facility], you and your health needs are our priority.  As part of our continuing mission to provide you with exceptional heart care, we have created designated Provider Care Teams.  These Care Teams include your primary Cardiologist (physician) and Advanced Practice Providers (APPs -  Physician Assistants and Nurse Practitioners) who all work together to provide you with the care you need, when you need it. You will need a follow up appointment in 3 months with Dr. Fletcher Anon

## 2018-02-27 NOTE — Telephone Encounter (Signed)
Patient called states she just had blood work at [facility] for lipid, cbc and cmp.  Will get other labs done and have them fax over

## 2018-02-27 NOTE — Addendum Note (Signed)
Addended by: Alvis Lemmings C on: 02/27/2018 12:08 PM   Modules accepted: Orders

## 2018-02-28 LAB — LIPID PANEL
Chol/HDL Ratio: 2.6 ratio (ref 0.0–4.4)
Cholesterol, Total: 136 mg/dL (ref 100–199)
HDL: 53 mg/dL (ref >39)
LDL CALC: 67 mg/dL (ref 0–99)
Triglycerides: 80 mg/dL (ref 0–149)
VLDL Cholesterol Cal: 16 mg/dL (ref 5–40)

## 2018-02-28 LAB — COMPREHENSIVE METABOLIC PANEL
ALBUMIN: 4.8 g/dL (ref 3.5–5.5)
ALT: 14 IU/L (ref 0–32)
AST: 15 IU/L (ref 0–40)
Albumin/Globulin Ratio: 2 (ref 1.2–2.2)
Alkaline Phosphatase: 108 IU/L (ref 39–117)
BILIRUBIN TOTAL: 0.7 mg/dL (ref 0.0–1.2)
BUN / CREAT RATIO: 15 (ref 9–23)
BUN: 23 mg/dL (ref 6–24)
CO2: 22 mmol/L (ref 20–29)
Calcium: 10.4 mg/dL — ABNORMAL HIGH (ref 8.7–10.2)
Chloride: 98 mmol/L (ref 96–106)
Creatinine, Ser: 1.54 mg/dL — ABNORMAL HIGH (ref 0.57–1.00)
GFR calc non Af Amer: 40 mL/min/{1.73_m2} — ABNORMAL LOW (ref >59)
GFR, EST AFRICAN AMERICAN: 46 mL/min/{1.73_m2} — AB (ref >59)
GLOBULIN, TOTAL: 2.4 g/dL (ref 1.5–4.5)
GLUCOSE: 85 mg/dL (ref 65–99)
Potassium: 5.3 mmol/L — ABNORMAL HIGH (ref 3.5–5.2)
SODIUM: 140 mmol/L (ref 134–144)
TOTAL PROTEIN: 7.2 g/dL (ref 6.0–8.5)

## 2018-02-28 LAB — CBC WITH DIFFERENTIAL/PLATELET
Basophils Absolute: 0.1 10*3/uL (ref 0.0–0.2)
Basos: 1 %
EOS (ABSOLUTE): 0.3 10*3/uL (ref 0.0–0.4)
EOS: 3 %
HEMATOCRIT: 51.5 % — AB (ref 34.0–46.6)
Hemoglobin: 17.6 g/dL — ABNORMAL HIGH (ref 11.1–15.9)
Immature Grans (Abs): 0 10*3/uL (ref 0.0–0.1)
Immature Granulocytes: 0 %
LYMPHS ABS: 1.9 10*3/uL (ref 0.7–3.1)
Lymphs: 19 %
MCH: 28.9 pg (ref 26.6–33.0)
MCHC: 34.2 g/dL (ref 31.5–35.7)
MCV: 84 fL (ref 79–97)
MONOS ABS: 0.6 10*3/uL (ref 0.1–0.9)
Monocytes: 6 %
Neutrophils Absolute: 7 10*3/uL (ref 1.4–7.0)
Neutrophils: 71 %
Platelets: 315 10*3/uL (ref 150–450)
RBC: 6.1 x10E6/uL — ABNORMAL HIGH (ref 3.77–5.28)
RDW: 13 % (ref 12.3–15.4)
WBC: 10 10*3/uL (ref 3.4–10.8)

## 2018-02-28 LAB — TSH: TSH: 3.16 u[IU]/mL (ref 0.450–4.500)

## 2018-02-28 LAB — URIC ACID: Uric Acid: 7.8 mg/dL — ABNORMAL HIGH (ref 2.5–7.1)

## 2018-03-02 ENCOUNTER — Telehealth: Payer: Self-pay

## 2018-03-02 DIAGNOSIS — E875 Hyperkalemia: Secondary | ICD-10-CM

## 2018-03-02 NOTE — Telephone Encounter (Signed)
Left message for pt to call back about resulted labs from 02/27/18.

## 2018-03-02 NOTE — Telephone Encounter (Signed)
-----   Message from [name], PA-C sent at 02/28/2018  7:40 AM EST ----- -Random glucose is ok.  -Renal function is higher than prior, though consistent with her approximate baseline given her CKD stage IV.  -Potassium is slightly elevated. Please hold lisinopril x 2 days and recheck bmet by the end of the week. If potassium is improved would likely resume at a lower dose with close monitoring of level.  -Liver function is normal.  -LDL at goal.  -HGB is mildly elevated with high-normal WBC and PLT counts indicating she is likely somewhat dehydrated. This can account for the slight elevation of her renal function as well. Would like for her to increase water intake.  -Thyroid function is normal.  -Uric acid is elevated. Perhaps, PCP added this on? She should follow up with her PCP for gout.

## 2018-03-03 ENCOUNTER — Telehealth: Payer: Self-pay

## 2018-03-03 NOTE — Telephone Encounter (Signed)
Spoke with Deanna with [facility] [employer] at [phone] no prior authorization required for Metoprolol succ 25 mg tablets.

## 2018-03-04 ENCOUNTER — Encounter: Payer: Self-pay | Admitting: Family Medicine

## 2018-03-04 DIAGNOSIS — Z955 Presence of coronary angioplasty implant and graft: Secondary | ICD-10-CM

## 2018-03-04 HISTORY — DX: Presence of coronary angioplasty implant and graft: Z95.5

## 2018-03-04 NOTE — Telephone Encounter (Signed)
Spoke with patient on phone about lab results that were taken on 02/27/18.  Per note from [name], [place] "-Random glucose is ok.  -Renal function is higher than prior, though consistent with her approximate baseline given her CKD stage IV.  -Potassium is slightly elevated. Please hold lisinopril x 2 days and recheck bmet by the end of the week. If potassium is improved would likely resume at a lower dose with close monitoring of level.  -Liver function is normal.  -LDL at goal.  -HGB is mildly elevated with high-normal WBC and PLT counts indicating she is likely somewhat dehydrated. This can account for the slight elevation of her renal function as well. Would like for her to increase water intake.  -Thyroid function is normal.  -Uric acid is elevated. Perhaps, PCP added this on? She should follow up with her PCP for gout."   Spoke with patient, per her report she is taking both lisinopril and potassium chloride 20 mEq once daily as ordered by her PCP. Lab note conveyed to patient. I expressed need for further instruction from provider to confirm plan of care. Call ended and I told patient I will call back with further instructions.  Spoke face to face with [name], per verbal report pt was to d/c Coreg and start on metoprolol. Per [place] please stop PO potassium chloride and lisinopril for 2 days and then [facility] Friday for blood redraw.   Called patient back to relay plan of care. Pt verbalized understanding and denied any further questions or concerns.  Pt encouraged to call back if needed.

## 2018-03-11 ENCOUNTER — Telehealth: Payer: Self-pay | Admitting: Family Medicine

## 2018-03-11 NOTE — Telephone Encounter (Signed)
Please let the patient know that her uric acid has significantly improved since the last time it was checked We want her to really follow a strict diet and avoid foods which have a lot of purines in them Please send her information, or she is welcome to look online to find foods to avoid for gout

## 2018-03-11 NOTE — Telephone Encounter (Signed)
Pt.notified

## 2018-03-16 ENCOUNTER — Other Ambulatory Visit: Payer: Self-pay | Admitting: Cardiovascular Disease

## 2018-03-18 ENCOUNTER — Other Ambulatory Visit: Payer: Self-pay

## 2018-03-18 DIAGNOSIS — E038 Other specified hypothyroidism: Secondary | ICD-10-CM

## 2018-03-18 MED ORDER — LEVOTHYROXINE SODIUM 112 MCG PO TABS: 112.0000 ug | ORAL_TABLET | Freq: Every day | ORAL | 10 refills | Status: DC

## 2018-03-18 NOTE — Telephone Encounter (Signed)
Lab Results  Component Value Date   TSH 3.160 02/27/2018

## 2018-03-23 ENCOUNTER — Other Ambulatory Visit: Payer: Self-pay

## 2018-03-24 ENCOUNTER — Other Ambulatory Visit: Payer: Self-pay

## 2018-04-10 ENCOUNTER — Other Ambulatory Visit: Payer: Self-pay | Admitting: Family Medicine

## 2018-04-10 NOTE — Telephone Encounter (Signed)
Patient appears to be having recurrent gout attacks She has been to the ER a few times lately She is also overdue for labs with cardiologist and her kidney function worsened last check Please have her contact her nephrologist for gout; kidney doctor treat gout, especially with her stage 4 CKD, they need to know that she is having more gout flares so medicines can be adjusted If for some reason her nephrologist will not treat her gout, then REFER her to rheumatology

## 2018-04-13 NOTE — Telephone Encounter (Signed)
Pt.notified

## 2018-05-05 ENCOUNTER — Encounter: Payer: Self-pay | Admitting: Cardiovascular Disease

## 2018-05-15 ENCOUNTER — Other Ambulatory Visit: Payer: Self-pay | Admitting: Physician Assistant

## 2018-05-26 ENCOUNTER — Ambulatory Visit: Payer: Medicaid Other | Admitting: Family Medicine

## 2018-06-03 NOTE — Progress Notes (Incomplete)
Cardiology Office Note   Date:  06/04/2018   ID:  Angelica, Herrera 03-17-1971, MRN [id number]  PCP:  Arnetha Courser, MD  Cardiologist:   Kathlyn Sacramento, MD Nephrologist: Dr. Rolly Salter  No chief complaint on file.     History of Present Illness: Angelica Herrera is a 48 y.o. female who For a follow-up visit regarding coronary artery disease and chronic systolic heart failure. She was last seen by me in 2018. She presented in October 2013 with acute anterolateral ST elevation myocardial infarction. She underwent emergent cardiac catheterization  which showed flush occlusion of left main coronary artery. There was significant difficulty in engaging the left main. Left main balloon angioplasty followed by drug-eluting stent placement was performed. She also had balloon angioplasty done to the LAD due to embolization from the left main. The patient had significant hemodynamic instability with ventricular tachycardia and hypotension.   She is a smoker. She had atypical chest pain in 2017. A pharmacologic nuclear stress test  showed prior anterior infarct with mild peri-infarct ischemia and  ejection fraction of 33%. I proceeded with cardiac catheterization which showed patent left main stent with moderate mid LAD stenosis and occluded second diagonal. Ejection fraction was 35-40% with anterior wall hypokinesis.  After presenting with chest pain, [name], PA-C, scheduled her for a pharmacologic nuclear stress test on 02/14/2017, which showed a similar finding of prior anterior infarct with mild peri-infarct ischemia. Most recent Echocardiogram was done on 02/21/2017, EF was 30-35%.  ***  {She reports that her chest pain is less than before.  He does describe generalized fatigue, anxiety and orthostatic dizziness.  She continues to be under stress.  Fortunately, smoke about 7-8 cigarettes a day.}  Past Medical History:  Diagnosis Date   Arthritis    gout   CAD (coronary artery  disease)    a. 02/2012 s/p anterolateral STEMI/Cath/PCI: LM 100 (3.0x15 DES ), LAD sm, severely diseased (PTCA w/ 1.75mm balloon), LCX nl, RI moderate size, nl, RCA 30-39m, AM/PDA/PLA nl, R->L collats noted; b. cath 08/03/15: widely patent ostLM stent at 10%, mLAD 60%,  mRCA 30%, EF 35-40%, ant wall HK, mildly elevated LVEDP   CKD (chronic kidney disease), stage IV (HCC)    HTN (hypertension)    Hyperlipidemia    Hypothyroidism    Ischemic cardiomyopathy    a. 02/2012 Echo: EF 40-45%, ant/lat HK, Gr1 DD, Mild MR, PASP 110mmHg.   NSVT (nonsustained ventricular tachycardia) ([place])    a. 02/2012 post-MI   Obesity    S/P primary angioplasty with coronary stent 03/04/2018   Dual antiplatelet therapy INDEFINITELY per cardiology note Nov 2019   Tobacco abuse     Past Surgical History:  Procedure Laterality Date   ABDOMINAL HYSTERECTOMY     CARDIAC CATHETERIZATION  2013   [facility] s/p stent   CARDIAC CATHETERIZATION Left 08/03/2015   Procedure: Left Heart Cath and Coronary Angiography;  Surgeon: Wellington Hampshire, MD;  Location: [place] [facility];  Service: Cardiovascular;  Laterality: Left;   DILATION AND CURETTAGE OF UTERUS     LEFT HEART CATHETERIZATION WITH CORONARY ANGIOGRAM N/A 02/11/2012   Procedure: LEFT HEART CATHETERIZATION WITH CORONARY ANGIOGRAM;  Surgeon: [name], MD;  Location: [facility] CATH LAB;  Service: Cardiovascular;  Laterality: N/A;     Current Outpatient Medications  Medication Sig Dispense Refill   aspirin 81 MG tablet Take 1 tablet (81 mg total) by mouth daily. 30 tablet    atorvastatin (LIPITOR) 40 MG tablet  TAKE 1 TABLET AT BEDTIME NIGHTLY FOR CHOLESTEROL (Patient taking differently: Take 40 mg by mouth at bedtime. ) 30 tablet 3   clopidogrel (PLAVIX) 75 MG tablet TAKE ONE (1) TABLET BY MOUTH ONCE DAILY 30 tablet 3   Colchicine (MITIGARE) 0.6 MG CAPS Two tablets by mouth once, then one pill one hour later; then one pill per day until flare resolves. No  atorvastatin during medication 13 capsule 0   furosemide (LASIX) 20 MG tablet Take 1 tablet (20 mg total) by mouth daily. 90 tablet 3   gabapentin (NEURONTIN) 300 MG capsule Take 1 capsule (300 mg total) by mouth 2 (two) times daily. 60 capsule 5   levothyroxine (SYNTHROID, LEVOTHROID) 112 MCG tablet Take 1 tablet (112 mcg total) by mouth daily before breakfast. 30 tablet 10   lisinopril (PRINIVIL,ZESTRIL) 10 MG tablet TAKE ONE (1) TABLET BY MOUTH ONCE DAILY 90 tablet 0   magnesium oxide (MAG-OX) 400 MG tablet Take 1 tablet (400 mg total) by mouth daily.     metoprolol succinate (TOPROL XL) 25 MG 24 hr tablet Take 0.5 tablets (12.5 mg total) by mouth daily. 45 tablet 3   nitroGLYCERIN (NITROSTAT) 0.4 MG SL tablet Place 1 tablet (0.4 mg total) under the tongue every 5 (five) minutes x 3 doses as needed for chest pain. 25 tablet 3   PARoxetine (PAXIL) 40 MG tablet Take 1 tablet (40 mg total) by mouth daily. Appointment needed for any further refills 30 tablet 5   potassium chloride SA (K-DUR,KLOR-CON) 20 MEQ tablet Take 20 mEq by mouth daily.     predniSONE (DELTASONE) 10 MG tablet Take 6 tablets day 1, take 5 tablets day 2, take 4 tablets day 3, take 3 tablets day 4, take 2 tablets day 5, take 1 tablet day 6 21 tablet 0   promethazine (PHENERGAN) 25 MG tablet Take 1 tablet (25 mg total) by mouth every 6 (six) hours as needed for nausea or vomiting. 12 tablet 1   Salicylic Acid 26 % SOLN Apply 1 drop topically daily. Soak wart in warm water for 5 min and dry foot prior to medication 10 mL 0   No current facility-administered medications for this visit.     Allergies:   Patient has no known allergies.    Social History:  The patient  reports that she has been smoking cigarettes. She has a 7.50 pack-year smoking history. She has never used smokeless tobacco. She reports current alcohol use. She reports that she does not use drugs.   Family History:  The patient's family history includes  AAA (abdominal aortic aneurysm) in her father; Cancer in her paternal grandfather; Diabetes in her mother; Hyperlipidemia in her mother; Hypertension in her brother, brother, daughter, father, mother, sister, and sister; Kidney disease in her mother; Supraventricular tachycardia in her sister; Thyroid disease in her father.    ROS:  Please see the history of present illness.   Otherwise, review of systems are positive for none.   All other systems are reviewed and negative.    PHYSICAL EXAM: VS:  There were no vitals taken for this visit. , BMI There is no height or weight on file to calculate BMI. GEN: Well nourished, well developed, in no acute distress  HEENT: normal  Neck: no JVD, carotid bruits, or masses Cardiac: RRR; no murmurs, rubs, or gallops,no edema  Respiratory:  clear to auscultation bilaterally, normal work of breathing GI: soft, nontender, nondistended, + BS MS: no deformity or atrophy  Skin: warm  and dry, no rash Neuro:  Strength and sensation are intact Psych: euthymic mood, full affect   EKG:  EKG is ordered today. The ekg ordered today demonstrates normal sinus rhythm with incomplete right bundle branch block and left anterior fascicular block.  Biatrial enlargement ***   Recent Labs: 02/27/2018: ALT 14; BUN 23; Creatinine, Ser 1.54; Hemoglobin 17.6; Platelets 315; Potassium 5.3; Sodium 140; TSH 3.160    Lipid Panel    Component Value Date/Time   CHOL 136 02/27/2018 1207   TRIG 80 02/27/2018 1207   HDL 53 02/27/2018 1207   CHOLHDL 2.6 02/27/2018 1207   CHOLHDL 3.9 02/13/2012 0525   VLDL 23 02/13/2012 0525   LDLCALC 67 02/27/2018 1207      Wt Readings from Last 3 Encounters:  02/27/18 165 lb (74.8 kg)  02/22/18 169 lb 15.6 oz (77.1 kg)  01/05/18 170 lb (77.1 kg)         ASSESSMENT AND PLAN:  1.  Coronary artery disease involving native coronary arteries with other forms of angina:   {I reviewed most recent cardiac catheterization and reviewed  most recent cardiac stress testing with her.  I do not think it is different from prior stress test in 2017.  Chest pain improved overall and thus I recommend continuing medical therapy.  I suspect that stress is contributing to a lot of her symptoms.}  2. Chronic systolic heart failure: Due to ischemic cardiomyopathy with most recent ejection fraction 30-35%. ***  {No evidence of volume overload. Continue treatment with carvedilol and lisinopril.  She appears to be euvolemic on furosemide 20 mg once daily.  Recent labs showed stable kidney function.} ***  3. Essential hypertension: Blood pressure is well controlled. She does have mild orthostatic dizziness.  I asked her not to change position quickly. ***  4. Hyperlipidemia: Most recent lipid profile showed LDL increased to 67. Continue treatment with atorvastatin.   5. Tobacco use: I discussed with her the importance of cessation. She reports being under significant stress.  6. Chronic kidney disease: Followed by nephrology. most recent creatinine was 1.54.     Disposition:   FU with me in *** months  I, Margit Banda am acting as a Education administrator for Kathlyn Sacramento, M.D.  {Add scribe attestation statement}  Signed, Kathlyn Sacramento, MD 06/04/18 [place], [place]

## 2018-06-04 ENCOUNTER — Ambulatory Visit: Payer: Self-pay | Admitting: Cardiovascular Disease

## 2018-06-05 ENCOUNTER — Encounter: Payer: Self-pay | Admitting: Cardiovascular Disease

## 2018-06-22 ENCOUNTER — Telehealth: Payer: Self-pay | Admitting: Family Medicine

## 2018-06-22 DIAGNOSIS — G8929 Other chronic pain: Secondary | ICD-10-CM

## 2018-06-22 DIAGNOSIS — M542 Cervicalgia: Principal | ICD-10-CM

## 2018-06-23 NOTE — Telephone Encounter (Signed)
Patient was due for an appointment in February She has not rescheduled Please let her know I'll provide limited Rx and we look forward to seeing her very soon Thank you

## 2018-06-24 NOTE — Telephone Encounter (Signed)
appt made for 3.25.2020 with Dr Sanda Klein

## 2018-06-26 ENCOUNTER — Emergency Department
Admission: EM | Admit: 2018-06-26 | Discharge: 2018-06-26 | Disposition: A | Discharge status: Home / Self Care | Payer: Medicaid Other | Attending: Emergency Medicine | Admitting: Emergency Medicine

## 2018-06-26 ENCOUNTER — Emergency Department: Payer: Medicaid Other

## 2018-06-26 ENCOUNTER — Other Ambulatory Visit: Payer: Self-pay

## 2018-06-26 ENCOUNTER — Encounter: Payer: Self-pay | Admitting: Emergency Medicine

## 2018-06-26 DIAGNOSIS — I5022 Chronic systolic (congestive) heart failure: Secondary | ICD-10-CM | POA: Diagnosis not present

## 2018-06-26 DIAGNOSIS — I2510 Atherosclerotic heart disease of native coronary artery without angina pectoris: Secondary | ICD-10-CM | POA: Diagnosis not present

## 2018-06-26 DIAGNOSIS — F17210 Nicotine dependence, cigarettes, uncomplicated: Secondary | ICD-10-CM | POA: Diagnosis not present

## 2018-06-26 DIAGNOSIS — K859 Acute pancreatitis without necrosis or infection, unspecified: Secondary | ICD-10-CM

## 2018-06-26 DIAGNOSIS — K5900 Constipation, unspecified: Secondary | ICD-10-CM

## 2018-06-26 DIAGNOSIS — I130 Hypertensive heart and chronic kidney disease with heart failure and stage 1 through stage 4 chronic kidney disease, or unspecified chronic kidney disease: Secondary | ICD-10-CM | POA: Insufficient documentation

## 2018-06-26 DIAGNOSIS — Z79899 Other long term (current) drug therapy: Secondary | ICD-10-CM | POA: Insufficient documentation

## 2018-06-26 DIAGNOSIS — E039 Hypothyroidism, unspecified: Secondary | ICD-10-CM | POA: Diagnosis not present

## 2018-06-26 DIAGNOSIS — Z7902 Long term (current) use of antithrombotics/antiplatelets: Secondary | ICD-10-CM | POA: Insufficient documentation

## 2018-06-26 DIAGNOSIS — R1012 Left upper quadrant pain: Secondary | ICD-10-CM | POA: Diagnosis present

## 2018-06-26 DIAGNOSIS — N184 Chronic kidney disease, stage 4 (severe): Secondary | ICD-10-CM | POA: Insufficient documentation

## 2018-06-26 DIAGNOSIS — Z7982 Long term (current) use of aspirin: Secondary | ICD-10-CM | POA: Diagnosis not present

## 2018-06-26 HISTORY — DX: Heart failure, unspecified: I50.9

## 2018-06-26 LAB — COMPREHENSIVE METABOLIC PANEL
ALT: 22 U/L (ref 0–44)
ANION GAP: 12 (ref 5–15)
AST: 21 U/L (ref 15–41)
Albumin: 3.4 g/dL — ABNORMAL LOW (ref 3.5–5.0)
Alkaline Phosphatase: 87 U/L (ref 38–126)
BUN: 22 mg/dL — ABNORMAL HIGH (ref 6–20)
CHLORIDE: 108 mmol/L (ref 98–111)
CO2: 22 mmol/L (ref 22–32)
Calcium: 8.7 mg/dL — ABNORMAL LOW (ref 8.9–10.3)
Creatinine, Ser: 1.14 mg/dL — ABNORMAL HIGH (ref 0.44–1.00)
GFR calc non Af Amer: 57 mL/min — ABNORMAL LOW (ref >60)
Glucose, Bld: 115 mg/dL — ABNORMAL HIGH (ref 70–99)
Potassium: 3.4 mmol/L — ABNORMAL LOW (ref 3.5–5.1)
Sodium: 142 mmol/L (ref 135–145)
Total Bilirubin: 0.8 mg/dL (ref 0.3–1.2)
Total Protein: 6.1 g/dL — ABNORMAL LOW (ref 6.5–8.1)

## 2018-06-26 LAB — CBC WITH DIFFERENTIAL/PLATELET
ABS IMMATURE GRANULOCYTES: 0.02 10*3/uL (ref 0.00–0.07)
Basophils Absolute: 0.1 10*3/uL (ref 0.0–0.1)
Basophils Relative: 1 %
EOS PCT: 1 %
Eosinophils Absolute: 0.1 10*3/uL (ref 0.0–0.5)
HCT: 41.7 % (ref 36.0–46.0)
HEMOGLOBIN: 13.5 g/dL (ref 12.0–15.0)
Immature Granulocytes: 0 %
LYMPHS PCT: 21 %
Lymphs Abs: 1.8 10*3/uL (ref 0.7–4.0)
MCH: 29.7 pg (ref 26.0–34.0)
MCHC: 32.4 g/dL (ref 30.0–36.0)
MCV: 91.9 fL (ref 80.0–100.0)
MONO ABS: 0.5 10*3/uL (ref 0.1–1.0)
MONOS PCT: 6 %
NEUTROS ABS: 6 10*3/uL (ref 1.7–7.7)
Neutrophils Relative %: 71 %
Platelets: 240 10*3/uL (ref 150–400)
RBC: 4.54 MIL/uL (ref 3.87–5.11)
RDW: 15.8 % — ABNORMAL HIGH (ref 11.5–15.5)
WBC: 8.5 10*3/uL (ref 4.0–10.5)
nRBC: 0 % (ref 0.0–0.2)

## 2018-06-26 LAB — TROPONIN I: TROPONIN I: 0.03 ng/mL — AB (ref <0.03)

## 2018-06-26 LAB — LIPASE, BLOOD: Lipase: 88 U/L — ABNORMAL HIGH (ref 11–51)

## 2018-06-26 MED ORDER — TRAMADOL HCL 50 MG PO TABS: 50.0000 mg | ORAL_TABLET | Freq: Four times a day (QID) | ORAL | 0 refills | Status: DC | PRN

## 2018-06-26 MED ORDER — IOHEXOL 300 MG/ML  SOLN
100.0000 mL | Freq: Once | INTRAMUSCULAR | Status: AC | PRN
  Administered 2018-06-26: 100 mL via INTRAVENOUS

## 2018-06-26 NOTE — ED Provider Notes (Signed)
[facility] Emergency Department Provider Note    First MD Initiated Contact with Patient 06/26/18 [phone]     (approximate)  I have reviewed the triage vital signs and the nursing notes.   HISTORY  Chief Complaint Abdominal Pain    HPI Angelica Herrera is a 48 y.o. female with below list of chronic medical conditions presents emergency department with acute onset of left upper quadrant abdominal pain which patient states began  last night.  Patient denies any nausea or vomiting.  Patient denies any diarrhea.  However patient does admit to constipation.  Patient does admit to drinking 2 alcoholic beverages last night that were "10% alcohol".        Past Medical History:  Diagnosis Date  . Arthritis    gout  . CAD (coronary artery disease)    a. 02/2012 s/p anterolateral STEMI/Cath/PCI: LM 100 (3.0x15 DES ), LAD sm, severely diseased (PTCA w/ 1.53mm balloon), LCX nl, RI moderate size, nl, RCA 30-65m, AM/PDA/PLA nl, R->L collats noted; b. cath 08/03/15: widely patent ostLM stent at 10%, mLAD 60%,  mRCA 30%, EF 35-40%, ant wall HK, mildly elevated LVEDP  . CKD (chronic kidney disease), stage IV ([place])   . HTN (hypertension)   . Hyperlipidemia   . Hypothyroidism   . Ischemic cardiomyopathy    a. 02/2012 Echo: EF 40-45%, ant/lat HK, Gr1 DD, Mild MR, PASP 54mmHg.  Marland Kitchen NSVT (nonsustained ventricular tachycardia) ([place])    a. 02/2012 post-MI  . Obesity   . S/P primary angioplasty with coronary stent 03/04/2018   Dual antiplatelet therapy INDEFINITELY per cardiology note Nov 2019  . Tobacco abuse     Patient Active Problem List   Diagnosis Date Noted  . S/P primary angioplasty with coronary stent 03/04/2018  . Depression, recurrent ([place]) 12/02/2017  . Chronic systolic heart failure ([place]) 11/21/2017  . Controlled substance agreement signed 09/07/2015  . Coronary artery disease involving native coronary artery   . Allergic rhinitis due to pollen 06/28/2015   . GAD (generalized anxiety disorder) 03/17/2015  . Degenerative disc disease, cervical 03/17/2015  . Hyperlipidemia   . Obesity   . CKD (chronic kidney disease), stage IV ([place])   . Ischemic cardiomyopathy   . NSVT (nonsustained ventricular tachycardia) ([place])   . Hypothyroidism   . History of acute anterior wall MI 02/12/2012  . Essential hypertension, malignant 02/12/2012  . Tobacco abuse 02/12/2012    Past Surgical History:  Procedure Laterality Date  . ABDOMINAL HYSTERECTOMY    . CARDIAC CATHETERIZATION  2013   [facility] s/p stent  . CARDIAC CATHETERIZATION Left 08/03/2015   Procedure: Left Heart Cath and Coronary Angiography;  Surgeon: Wellington Hampshire, MD;  Location: [place] [facility];  Service: Cardiovascular;  Laterality: Left;  . DILATION AND CURETTAGE OF UTERUS    . LEFT HEART CATHETERIZATION WITH CORONARY ANGIOGRAM N/A 02/11/2012   Procedure: LEFT HEART CATHETERIZATION WITH CORONARY ANGIOGRAM;  Surgeon: [name], MD;  Location[facility] CATH LAB;  Service: Cardiovascular;  Laterality: N/A;    Prior to Admission medications   Medication Sig Start Date End Date Taking? Authorizing Provider  aspirin 81 MG tablet Take 1 tablet (81 mg total) by mouth daily. 02/15/12   Theora Gianotti, NP  atorvastatin (LIPITOR) 40 MG tablet TAKE 1 TABLET AT BEDTIME NIGHTLY FOR CHOLESTEROL Patient taking differently: Take 40 mg by mouth at bedtime.  07/16/17   Minna Merritts, MD  clopidogrel (PLAVIX) 75 MG tablet TAKE ONE (1) TABLET BY  MOUTH ONCE DAILY 03/16/18   Wellington Hampshire, MD  Colchicine (MITIGARE) 0.6 MG CAPS Two tablets by mouth once, then one pill one hour later; then one pill per day until flare resolves. No atorvastatin during medication 02/22/18   Laban Emperor, PA-C  furosemide (LASIX) 20 MG tablet Take 1 tablet (20 mg total) by mouth daily. 02/27/18   Dunn, Areta Haber, PA-C  gabapentin (NEURONTIN) 300 MG capsule TAKE 1 CAPSULE BY MOUTH TWICE DAILY 06/23/18   Lada, Satira Anis, MD   levothyroxine (SYNTHROID, LEVOTHROID) 112 MCG tablet Take 1 tablet (112 mcg total) by mouth daily before breakfast. 03/18/18   Lada, Satira Anis, MD  lisinopril (PRINIVIL,ZESTRIL) 10 MG tablet TAKE ONE (1) TABLET BY MOUTH ONCE DAILY 07/16/17   [name], PA-C  magnesium oxide (MAG-OX) 400 MG tablet Take 1 tablet (400 mg total) by mouth daily. 05/09/16   [name], PA-C  metoprolol succinate (TOPROL XL) 25 MG 24 hr tablet Take 0.5 tablets (12.5 mg total) by mouth daily. 02/27/18   Dunn, Areta Haber, PA-C  nitroGLYCERIN (NITROSTAT) 0.4 MG SL tablet Place 1 tablet (0.4 mg total) under the tongue every 5 (five) minutes x 3 doses as needed for chest pain. 02/15/12   Theora Gianotti, NP  PARoxetine (PAXIL) 40 MG tablet Take 1 tablet (40 mg total) by mouth daily. Appointment needed for any further refills 11/21/17   Lada, Satira Anis, MD  potassium chloride SA (K-DUR,KLOR-CON) 20 MEQ tablet Take 20 mEq by mouth daily.    [provider]  predniSONE (DELTASONE) 10 MG tablet Take 6 tablets day 1, take 5 tablets day 2, take 4 tablets day 3, take 3 tablets day 4, take 2 tablets day 5, take 1 tablet day 6 02/22/18   Laban Emperor, PA-C  promethazine (PHENERGAN) 25 MG tablet Take 1 tablet (25 mg total) by mouth every 6 (six) hours as needed for nausea or vomiting. 11/09/17   Johnn Hai, PA-C  Salicylic Acid 26 % SOLN Apply 1 drop topically daily. Soak wart in warm water for 5 min and dry foot prior to medication 02/22/18   Laban Emperor, PA-C    Allergies Patient has no known allergies.  Family History  Problem Relation Age of Onset  . Hypertension Father   . Thyroid disease Father   . AAA (abdominal aortic aneurysm) Father   . Hypertension Mother   . Hyperlipidemia Mother   . Diabetes Mother   . Kidney disease Mother   . Hypertension Sister   . Hypertension Sister   . Supraventricular tachycardia Sister   . Hypertension Brother   . Hypertension Brother   . Hypertension Daughter    . Cancer Paternal Grandfather        lung    Social History Social History   Tobacco Use  . Smoking status: Current Every Day Smoker    Packs/day: 0.25    Years: 30.00    Pack years: 7.50    Types: Cigarettes  . Smokeless tobacco: Never Used  Substance Use Topics  . Alcohol use: Yes  . Drug use: No    Types: Marijuana    Review of Systems Constitutional: No fever/chills Eyes: No visual changes. ENT: No sore throat. Cardiovascular: Denies chest pain. Respiratory: Denies shortness of breath. Gastrointestinal: Positive for abdominal pain.  No nausea, no vomiting.  No diarrhea.  No constipation. Genitourinary: Negative for dysuria. Musculoskeletal: Negative for neck pain.  Negative for back pain. Integumentary: Negative for rash. Neurological: Negative  for headaches, focal weakness or numbness.   ____________________________________________   PHYSICAL EXAM:  VITAL SIGNS: ED Triage Vitals  Enc Vitals Group     BP 06/26/18 0321 (!) 157/100     Pulse Rate 06/26/18 0321 75     Resp 06/26/18 0321 18     Temp --      Temp Source 06/26/18 0321 Oral     SpO2 06/26/18 0321 98 %     Weight 06/26/18 0324 77.1 kg (170 lb)     Height 06/26/18 0324 1.702 m (5\' 7" )     Head Circumference --      Peak Flow --      Pain Score 06/26/18 0324 9     Pain Loc --      Pain Edu? --      Excl. in [place]? --     Constitutional: Alert and oriented. Well appearing and in no acute distress. Eyes: Conjunctivae are normal.  Mouth/Throat: Mucous membranes are moist. Oropharynx non-erythematous. Neck: No stridor.   Cardiovascular: Normal rate, regular rhythm. Good peripheral circulation. Grossly normal heart sounds. Respiratory: Normal respiratory effort.  No retractions. Lungs CTAB. Gastrointestinal: Epigastric/left upper quadrant tenderness to palpation.. No distention.  Musculoskeletal: No lower extremity tenderness nor edema. No gross deformities of extremities. Neurologic:  Normal  speech and language. No gross focal neurologic deficits are appreciated.  Skin:  Skin is warm, dry and intact. No rash noted. Psychiatric: Mood and affect are normal. Speech and behavior are normal.  ____________________________________________   LABS (all labs ordered are listed, but only abnormal results are displayed)  Labs Reviewed  CBC WITH DIFFERENTIAL/PLATELET - Abnormal; Notable for the following components:      Result Value   RDW 15.8 (*)    All other components within normal limits  COMPREHENSIVE METABOLIC PANEL - Abnormal; Notable for the following components:   Potassium 3.4 (*)    Glucose, Bld 115 (*)    BUN 22 (*)    Creatinine, Ser 1.14 (*)    Calcium 8.7 (*)    Total Protein 6.1 (*)    Albumin 3.4 (*)    GFR calc non Af Amer 57 (*)    All other components within normal limits  LIPASE, BLOOD - Abnormal; Notable for the following components:   Lipase 88 (*)    All other components within normal limits  TROPONIN I - Abnormal; Notable for the following components:   Troponin I 0.03 (*)    All other components within normal limits  URINALYSIS, COMPLETE (UACMP) WITH MICROSCOPIC   ____________________________________________  EKG  ED ECG REPORT I, [place] N Choya Tornow, the attending physician, personally viewed and interpreted this ECG.   Date: 06/26/2018  EKG Time: 3:32 AM  Rate: 71  Rhythm: Normal sinus rhythm  Axis: Normal  Intervals: Normal  ST&T Change: None   RADIOLOGY I, [place] N Kianna Billet, personally viewed and evaluated these images (plain radiographs) as part of my medical decision making, as well as reviewing the written report by the radiologist.  ED MD interpretation: Normal nonobstructive bowel gas pattern on abdominal plain film x-ray.  Official radiology report(s): Dg Abdomen 1 View  Result Date: 06/26/2018 CLINICAL DATA:  Left upper back pain radiating to the abdomen. Nausea and constipation. EXAM: ABDOMEN - 1 VIEW COMPARISON:  CT abdomen  and pelvis 02/07/2008 FINDINGS: Scattered gas and stool throughout the colon. No small or large bowel distention. No radiopaque stones. Calcified phleboliths in the pelvis. Visualized bones and soft tissue contours appear  intact. IMPRESSION: Normal nonobstructive bowel gas pattern. Electronically Signed   By: Lucienne Capers M.D.   On: 06/26/2018 03:45    ____________________________________________   PROCEDURES    Procedures   ____________________________________________   INITIAL IMPRESSION / MDM / [place] / ED COURSE  As part of my medical decision making, I reviewed the following data within the electronic MEDICAL RECORD NUMBER   48 year old female presenting with above-stated history and physical exam concerning for possible pancreatitis diverticulitis and constipation.  Patient states that she does not drink heavily.  Patient's lipase 88.  CT scan of the abdomen pelvis pending ____________________________________________  FINAL CLINICAL IMPRESSION(S) / ED DIAGNOSES  Final diagnoses:  Constipation, unspecified constipation type  Acute pancreatitis, unspecified complication status, unspecified pancreatitis type     MEDICATIONS GIVEN DURING THIS VISIT:  Medications - No data to display   ED Discharge Orders    None       Note:  This document was prepared using Dragon voice recognition software and may include unintentional dictation errors.   Gregor Hams, MD 06/26/18 [phone]

## 2018-06-26 NOTE — ED Provider Notes (Signed)
CT scan is overall reassuring, discussed incidental findings with the patient including hepatomegaly, cardiomegaly.  Recommend follow-up with PCP.  She is feeling well currently, will discharge with pain medication, return precautions discussed   Lavonia Drafts, MD 06/26/18 [phone]

## 2018-06-26 NOTE — ED Triage Notes (Signed)
Pt to triage via w/c with no distress noted, brought in by EMS; pt st awoke at midnight with left upper back pain radiating around into left upper abd accomp by nausea; st recent constipation

## 2018-06-29 ENCOUNTER — Telehealth: Payer: Self-pay | Admitting: Cardiovascular Disease

## 2018-06-29 NOTE — Telephone Encounter (Signed)
Pt c/o medication issue:  1. Name of Medication: Metoprolol   2. How are you currently taking this medication (dosage and times per day)? 12.5 mg PO q d   3. Are you having a reaction (difficulty breathing--STAT)?  SOB headache no energy   4. What is your medication issue? Patient was recently changed from carvedilol about 3 weeks ago started to have these symptoms

## 2018-06-29 NOTE — Telephone Encounter (Signed)
Returned the call to the patient. She stated that she started her Metoprolol three weeks ago. She was switched from Carvedilol to Metoprolol in November of 2019 but stated she just made the switch three weeks ago.   Since then she has been having lack of energy and shortness of breath. When asked to confirm the dosage, she stated that she was taking Metoprolol 25 mg daily. She was prescribed Metoprolol 12.5 mg (half a tablet) daily.  She is unable to check her blood pressure at home. She stated that she will try the Metoprolol 12.5 mg daily and see if she feels any better. She requested a follow up with [name], PA. An appointment has been made for 3/25 at 8:30.

## 2018-06-30 NOTE — Telephone Encounter (Signed)
If symptoms persist, we can change her back to Coreg.

## 2018-06-30 NOTE — Telephone Encounter (Signed)
Patient made aware of Ryan's recommendations.  She would still like to see Ryan.  She stated she was supposed to have echo prior to appointment. She requested to have that prior to appointment. Advised I will route to scheduling to see about scheduling echo and rescheduling appointment to be after echo sometime.

## 2018-07-02 ENCOUNTER — Other Ambulatory Visit: Payer: Self-pay | Admitting: Physician Assistant

## 2018-07-02 DIAGNOSIS — I2510 Atherosclerotic heart disease of native coronary artery without angina pectoris: Secondary | ICD-10-CM

## 2018-07-02 DIAGNOSIS — R0600 Dyspnea, unspecified: Secondary | ICD-10-CM

## 2018-07-02 DIAGNOSIS — I5022 Chronic systolic (congestive) heart failure: Secondary | ICD-10-CM

## 2018-07-03 ENCOUNTER — Telehealth: Payer: Self-pay

## 2018-07-03 ENCOUNTER — Other Ambulatory Visit: Payer: Self-pay

## 2018-07-03 NOTE — Telephone Encounter (Signed)
Call to patient to  Attempt toreschedule in regards to COVID-19 restrictions.

## 2018-07-06 NOTE — Telephone Encounter (Signed)

## 2018-07-08 ENCOUNTER — Encounter: Payer: Self-pay | Admitting: Nurse Practitioner

## 2018-07-08 ENCOUNTER — Telehealth: Payer: Self-pay | Admitting: Physician Assistant

## 2018-07-08 ENCOUNTER — Telehealth (INDEPENDENT_AMBULATORY_CARE_PROVIDER_SITE_OTHER): Payer: Medicaid Other | Admitting: Nurse Practitioner

## 2018-07-08 ENCOUNTER — Ambulatory Visit: Payer: Self-pay | Admitting: Physician Assistant

## 2018-07-08 ENCOUNTER — Other Ambulatory Visit: Payer: Self-pay

## 2018-07-08 DIAGNOSIS — I5022 Chronic systolic (congestive) heart failure: Secondary | ICD-10-CM | POA: Diagnosis not present

## 2018-07-08 DIAGNOSIS — Z720 Tobacco use: Secondary | ICD-10-CM

## 2018-07-08 DIAGNOSIS — F3341 Major depressive disorder, recurrent, in partial remission: Secondary | ICD-10-CM

## 2018-07-08 DIAGNOSIS — M542 Cervicalgia: Secondary | ICD-10-CM

## 2018-07-08 DIAGNOSIS — F411 Generalized anxiety disorder: Secondary | ICD-10-CM

## 2018-07-08 DIAGNOSIS — I10 Essential (primary) hypertension: Secondary | ICD-10-CM

## 2018-07-08 DIAGNOSIS — G8929 Other chronic pain: Secondary | ICD-10-CM | POA: Diagnosis not present

## 2018-07-08 DIAGNOSIS — J0110 Acute frontal sinusitis, unspecified: Secondary | ICD-10-CM

## 2018-07-08 DIAGNOSIS — E038 Other specified hypothyroidism: Secondary | ICD-10-CM

## 2018-07-08 DIAGNOSIS — I110 Hypertensive heart disease with heart failure: Secondary | ICD-10-CM

## 2018-07-08 DIAGNOSIS — R05 Cough: Secondary | ICD-10-CM

## 2018-07-08 DIAGNOSIS — N184 Chronic kidney disease, stage 4 (severe): Secondary | ICD-10-CM

## 2018-07-08 DIAGNOSIS — F17210 Nicotine dependence, cigarettes, uncomplicated: Secondary | ICD-10-CM

## 2018-07-08 DIAGNOSIS — E782 Mixed hyperlipidemia: Secondary | ICD-10-CM

## 2018-07-08 DIAGNOSIS — R059 Cough, unspecified: Secondary | ICD-10-CM

## 2018-07-08 MED ORDER — AMOXICILLIN-POT CLAVULANATE 875-125 MG PO TABS: 1.0000 | ORAL_TABLET | Freq: Two times a day (BID) | ORAL | 0 refills | Status: DC

## 2018-07-08 MED ORDER — PAROXETINE HCL 40 MG PO TABS: 40.0000 mg | ORAL_TABLET | Freq: Every day | ORAL | 1 refills | Status: DC

## 2018-07-08 MED ORDER — LEVOTHYROXINE SODIUM 112 MCG PO TABS: 112.0000 ug | ORAL_TABLET | Freq: Every day | ORAL | 1 refills | Status: DC

## 2018-07-08 MED ORDER — LORATADINE 10 MG PO TABS: 10.0000 mg | ORAL_TABLET | Freq: Every day | ORAL | 1 refills | Status: DC

## 2018-07-08 MED ORDER — GABAPENTIN 300 MG PO CAPS: 300.0000 mg | ORAL_CAPSULE | Freq: Two times a day (BID) | ORAL | 5 refills | Status: DC

## 2018-07-08 MED ORDER — BENZONATATE 100 MG PO CAPS: 100.0000 mg | ORAL_CAPSULE | Freq: Three times a day (TID) | ORAL | 0 refills | Status: DC | PRN

## 2018-07-08 NOTE — Progress Notes (Signed)
Virtual Visit via Telephone Note  I connected withPatricia L Herrera on 07/08/18 at 11:20 AM EDT by telephoneand verified that I am speaking with the correct person using two identifiers.  Staff discussed the limitations, risks, security and privacy concerns of performing an evaluation and management service by telephone and the availability of in person appointments. I also discussed with the patient that there may be a patient responsible charge related to this service. The patient expressed understanding and agreed to proceed.   History of Present Illness: Patient endorses head and chest congestion, denies fever. Endorses over 2 weeks with blowing nose and strong productive cough, sinus fullness and pain. States when she lays down it makes her cough and she cant tolerate laying flat. Has to sleep with a lot of pillow. States has a lot of thick yellow congestion. She has tried vapor rub and oral decongestant with mild relief of symptoms. Endorses exertional shortness of breath. Endorses wheezing when she lays down.   Systolic Heart Failure Sees Cardiology- Dr. Fletcher Anon, takes lasix 20mg , metoprolol 25mg , and potassium supplementation Denies chest pain, peripheral edema  Hypertension Takes metoprolol 25mg  daily; not currently taking lisinopril because it was making her feel bad.  Denies blurry vision, dizziness, lightheaded.   History of MI In addition to BP and cholesterol control patient takes aspirin 81mg  and plavix 28mh; has nitroglycerin PRN  Hyperlipidemia Takes atorvastatin 40mg   Every day.  [facility]       Lab Results  Component Value Date   CHOL 136 02/27/2018   HDL 53 02/27/2018   LDLCALC 67 02/27/2018   TRIG 80 02/27/2018   CHOLHDL 2.6 02/27/2018      Hypothyroidism Takes synthroid 17mcg once a day [facility]       Lab Results  Component Value Date   TSH 3.160 02/27/2018      Depression & Anxiety  Takes paxil 40mg  daily without missed  doses- she does feel like it is working well. She used to see therapist but notes it has been awhile but she was in a very bad place then and she is doing well now.  GAD-7 score 21.    Depression screen [facility] 2/9 07/08/2018 11/21/2017 08/20/2017 07/23/2016 01/25/2016  Decreased Interest 3 3 0 0 3  Down, Depressed, Hopeless 3 2 0 1 3  PHQ - 2 Score 6 5 0 1 6  Altered sleeping 3 3 - - 3  Tired, decreased energy 3 3 - - 3  Change in appetite 2 3 - - 3  Feeling bad or failure about yourself  0 3 - - 3  Trouble concentrating 3 3 - - 1  Moving slowly or fidgety/restless 3 3 - - 1  Suicidal thoughts 0 0 - - 0  PHQ-9 Score 20 23 - - 20  Difficult doing work/chores Very difficult Somewhat difficult - - -    Tobacco abuse Patient is smoking about 1 pack in three days; tries to make it last it longer.   Chronic Kidney Disease  Sees nephrology- Dr. Juleen China; Last GFR on 06/26/2018 was 57- improved from 40, 4 months ago [facility]       Lab Results  Component Value Date   CREATININE 1.14 (H) 06/26/2018     Chronic neck pain Takes gabapentin twice daily; controlled on this regimen. Has seen physical therapy and works on doing the exercises at [employer].   Observations/Objective: Right side frontal sinus pain worse then left when patient palpated it; no maxillary tenderness Strong cough through call  and blowing nose frequently Hoarse voice, sounds congested Able to speak in full sentences without issue, alert and oriented  Assessment and Plan:  1. Chronic systolic heart failure (HCC) Stable, asymptomatic continue current management, follow up with cards   2. Chronic neck pain Stable, encouraged PT exercises  - gabapentin (NEURONTIN) 300 MG capsule; Take 1 capsule (300 mg total) by mouth 2 (two) times daily.  Dispense: 60 capsule; Refill: 5  3. Other specified hypothyroidism - levothyroxine (SYNTHROID, LEVOTHROID) 112 MCG tablet; Take 1 tablet (112 mcg total) by mouth daily before  breakfast.  Dispense: 90 tablet; Refill: 1  4. Recurrent major depressive disorder, in partial remission [facility]) Patient states she feels it is well controlled despite high number, related to chronic medical conditions. Would like to continue paxil and current dose, declines therapy  - PARoxetine (PAXIL) 40 MG tablet; Take 1 tablet (40 mg total) by mouth daily.  Dispense: 90 tablet; Refill: 1  5. GAD (generalized anxiety disorder) Despite high score (3's all the way down GAD7- 21) she feels it is well controlled.  - PARoxetine (PAXIL) 40 MG tablet; Take 1 tablet (40 mg total) by mouth daily.  Dispense: 90 tablet; Refill: 1  6. Essential hypertension, malignant Asymptomatic, unable to check at home, stopped her lisinopril due to what she thinks was hypotension- she has appointment with cardiology tomorrow who manages this instructed to inform them.  7. CKD (chronic kidney disease), stage IV (HCC) Improved GFR, continue follow up with Dr. Rolly Salter   8. Mixed hyperlipidemia Well controlled, continue statin therapy   9. Tobacco abuse Recommend her to continue to cut down   10. Acute non-recurrent frontal sinusitis - amoxicillin-clavulanate (AUGMENTIN) 875-125 MG tablet; Take 1 tablet by mouth 2 (two) times daily.  Dispense: 20 tablet; Refill: 0 - loratadine (CLARITIN) 10 MG tablet; Take 1 tablet (10 mg total) by mouth daily.  Dispense: 30 tablet; Refill: 1  11. Cough Self- quarantine, discussed ER precautions  - amoxicillin-clavulanate (AUGMENTIN) 875-125 MG tablet; Take 1 tablet by mouth 2 (two) times daily.  Dispense: 20 tablet; Refill: 0 - loratadine (CLARITIN) 10 MG tablet; Take 1 tablet (10 mg total) by mouth daily.  Dispense: 30 tablet; Refill: 1 - benzonatate (TESSALON) 100 MG capsule; Take 1-2 capsules (100-200 mg total) by mouth 3 (three) times daily as needed for cough.  Dispense: 40 capsule; Refill: 0   Follow Up Instructions:  follow up in 6 months.  I discussed  the assessment and treatment plan with the patient. The patient was provided an opportunity to ask questions and all were answered. The patient agreed with the plan and demonstrated an understanding of the instructions.  The patient was advised to call back or seek an in-person evaluation if the symptoms worsen or if the condition fails to improve as anticipated.  I provided 22 minutes of non-face-to-face time during this encounter.   Fredderick Severance, NP

## 2018-07-08 NOTE — H&P (Signed)
Virtual Visit via Telephone Note  I connected with Angelica Herrera on 07/08/18 at 11:20 AM EDT by telephone and verified that I am speaking with the correct person using two identifiers.   Staff discussed the limitations, risks, security and privacy concerns of performing an evaluation and management service by telephone and the availability of in person appointments. I also discussed with the patient that there may be a patient responsible charge related to this service. The patient expressed understanding and agreed to proceed.   History of Present Illness: Patient endorses head and chest congestion, denies fever. Endorses over 2 weeks with blowing nose and strong productive cough, sinus fullness and pain. States when she lays down it makes her cough and she cant tolerate laying flat. Has to sleep with a lot of pillow. States has a lot of thick yellow congestion. She has tried vapor rub and oral decongestant with mild relief of symptoms. Endorses exertional shortness of breath. Endorses wheezing when she lays down.   Systolic Heart Failure Sees Cardiology- Dr. Fletcher Anon, takes lasix 20mg , metoprolol 25mg , and potassium supplementation Denies chest pain, peripheral edema  Hypertension Takes metoprolol 25mg  daily; not currently taking lisinopril because it was making her feel bad.  Denies blurry vision, dizziness, lightheaded.   History of MI In addition to BP and cholesterol control patient takes aspirin 81mg  and plavix 71mh; has nitroglycerin PRN  Hyperlipidemia Takes atorvastatin 40mg   Every day.  Lab Results  Component Value Date   CHOL 136 02/27/2018   HDL 53 02/27/2018   LDLCALC 67 02/27/2018   TRIG 80 02/27/2018   CHOLHDL 2.6 02/27/2018    Hypothyroidism Takes synthroid 161mcg once a day Lab Results  Component Value Date   TSH 3.160 02/27/2018    Depression & Anxiety  Takes paxil 40mg  daily without missed doses- she does feel like it is working well. She used to see  therapist but notes it has been awhile but she was in a very bad place then and she is doing well now.  GAD-7 score 21.    Depression screen [facility] 2/9 07/08/2018 11/21/2017 08/20/2017 07/23/2016 01/25/2016  Decreased Interest 3 3 0 0 3  Down, Depressed, Hopeless 3 2 0 1 3  PHQ - 2 Score 6 5 0 1 6  Altered sleeping 3 3 - - 3  Tired, decreased energy 3 3 - - 3  Change in appetite 2 3 - - 3  Feeling bad or failure about yourself  0 3 - - 3  Trouble concentrating 3 3 - - 1  Moving slowly or fidgety/restless 3 3 - - 1  Suicidal thoughts 0 0 - - 0  PHQ-9 Score 20 23 - - 20  Difficult doing work/chores Very difficult Somewhat difficult - - -    Tobacco abuse Patient is smoking about 1 pack in three days; tries to make it last it longer.   Chronic Kidney Disease  Sees nephrology- Dr. Juleen China; Last GFR on 06/26/2018 was 57- improved from 40, 4 months ago Lab Results  Component Value Date   CREATININE 1.14 (H) 06/26/2018   Chronic neck pain Takes gabapentin twice daily; controlled on this regimen. Has seen physical therapy and works on doing the exercises at [employer].   Observations/Objective: Right side frontal sinus pain worse then left when patient palpated it; no maxillary tenderness Strong cough through call and blowing nose frequently Hoarse voice, sounds congested Able to speak in full sentences without issue, alert and oriented  Assessment and Plan:  1. Chronic systolic heart failure (HCC) Stable, asymptomatic continue current management, follow up with cards   2. Chronic neck pain Stable, encouraged PT exercises  - gabapentin (NEURONTIN) 300 MG capsule; Take 1 capsule (300 mg total) by mouth 2 (two) times daily.  Dispense: 60 capsule; Refill: 5  3. Other specified hypothyroidism - levothyroxine (SYNTHROID, LEVOTHROID) 112 MCG tablet; Take 1 tablet (112 mcg total) by mouth daily before breakfast.  Dispense: 90 tablet; Refill: 1  4. Recurrent major depressive disorder, in partial  remission [facility]) Patient states she feels it is well controlled despite high number, related to chronic medical conditions. Would like to continue paxil and current dose, declines therapy  - PARoxetine (PAXIL) 40 MG tablet; Take 1 tablet (40 mg total) by mouth daily.  Dispense: 90 tablet; Refill: 1  5. GAD (generalized anxiety disorder) Despite high score (3's all the way down GAD7- 21) she feels it is well controlled.  - PARoxetine (PAXIL) 40 MG tablet; Take 1 tablet (40 mg total) by mouth daily.  Dispense: 90 tablet; Refill: 1  6. Essential hypertension, malignant Asymptomatic, unable to check at home, stopped her lisinopril due to what she thinks was hypotension- she has appointment with cardiology tomorrow who manages this instructed to inform them.  7. CKD (chronic kidney disease), stage IV (HCC) Improved GFR, continue follow up with Dr. Rolly Salter   8. Mixed hyperlipidemia Well controlled, continue statin therapy   9. Tobacco abuse Recommend her to continue to cut down   10. Acute non-recurrent frontal sinusitis - amoxicillin-clavulanate (AUGMENTIN) 875-125 MG tablet; Take 1 tablet by mouth 2 (two) times daily.  Dispense: 20 tablet; Refill: 0 - loratadine (CLARITIN) 10 MG tablet; Take 1 tablet (10 mg total) by mouth daily.  Dispense: 30 tablet; Refill: 1  11. Cough Self- quarantine, discussed ER precautions  - amoxicillin-clavulanate (AUGMENTIN) 875-125 MG tablet; Take 1 tablet by mouth 2 (two) times daily.  Dispense: 20 tablet; Refill: 0 - loratadine (CLARITIN) 10 MG tablet; Take 1 tablet (10 mg total) by mouth daily.  Dispense: 30 tablet; Refill: 1 - benzonatate (TESSALON) 100 MG capsule; Take 1-2 capsules (100-200 mg total) by mouth 3 (three) times daily as needed for cough.  Dispense: 40 capsule; Refill: 0   Follow Up Instructions:  follow up in 6 months.  I discussed the assessment and treatment plan with the patient. The patient was provided an opportunity to ask questions  and all were answered. The patient agreed with the plan and demonstrated an understanding of the instructions.   The patient was advised to call back or seek an in-person evaluation if the symptoms worsen or if the condition fails to improve as anticipated.  I provided 20 minutes of non-face-to-face time during this encounter.   Fredderick Severance, NP

## 2018-07-08 NOTE — Telephone Encounter (Signed)
Message received from [facility], [place] that the patient's appt with him was cancelled for today. Please try to call and arrange for a virtual telephone visit.   TELEPHONE CALL NOTE  Angelica Herrera has been deemed a candidate for a follow-up tele-health visit to limit community exposure during the Covid-19 pandemic. I spoke with the patient via phone to ensure availability of phone/video source, confirm preferred email & phone number, discuss instructions and expectations, and review consent (emailed to the patient to review).   I reminded Angelica Herrera to be prepared with any vital sign and/or heart rhythm information that could potentially be obtained via home monitoring, at the time of her visit.  Finally, I reminded Angelica Herrera to expect an e-mail containing a link for their video-based visit approximately 15 minutes before her visit, or alternatively, a phone call at the time of her visit if her visit is planned to be a phone encounter.  Did the patient verbally consent to treatment as below? Emailed consent to the patient. The patient has been instructed to read the consent and call back to let us know if she agrees or does not agree to the terms.   Alvis Lemmings, RN 07/08/2018 10:39 AM   Copy of the below consent was emailed to the patient  [place]  I hereby voluntarily request, consent and authorize [facility] and its employed or contracted physicians, physician assistants, nurse practitioners or other licensed health care professionals (the Practitioner), to provide me with telemedicine health care services (the "Services") as deemed necessary by the treating Practitioner. I acknowledge and consent to receive the Services by the Practitioner via telemedicine. I understand that the telemedicine visit will involve communicating with the Practitioner through live audiovisual communication technology and the disclosure of certain  medical information by electronic transmission. I acknowledge that I have been given the opportunity to request an in-person assessment or other available alternative prior to the telemedicine visit and am voluntarily participating in the telemedicine visit.  I understand that I have the right to withhold or withdraw my consent to the use of telemedicine in the course of my care at any time, without affecting my right to future care or treatment, and that the Practitioner or I may terminate the telemedicine visit at any time. I understand that I have the right to inspect all information obtained and/or recorded in the course of the telemedicine visit and may receive copies of available information for a reasonable fee.  I understand that some of the potential risks of receiving the Services via telemedicine include:  Marland Kitchen Delay or interruption in medical evaluation due to technological equipment failure or disruption; . Information transmitted may not be sufficient (e.g. poor resolution of images) to allow for appropriate medical decision making by the Practitioner; and/or  . In rare instances, security protocols could fail, causing a breach of personal health information.  Furthermore, I acknowledge that it is my responsibility to provide information about my medical history, conditions and care that is complete and accurate to the best of my ability. I acknowledge that Practitioner's advice, recommendations, and/or decision may be based on factors not within their control, such as incomplete or inaccurate data provided by me or distortions of diagnostic images or specimens that may result from electronic transmissions. I understand that the practice of medicine is not an exact science and that Practitioner makes no warranties or guarantees regarding treatment outcomes. I acknowledge that I will receive  a copy of this consent concurrently upon execution via email to the email address I last provided but may  also request a printed copy by calling the office of [facility].    I understand that my insurance will be billed for this visit.   I have read or had this consent read to me. . I understand the contents of this consent, which adequately explains the benefits and risks of the Services being provided via telemedicine.  . I have been provided ample opportunity to ask questions regarding this consent and the Services and have had my questions answered to my satisfaction. . I give my informed consent for the services to be provided through the use of telemedicine in my medical care  By participating in this telemedicine visit I agree to the above.

## 2018-07-13 NOTE — Telephone Encounter (Signed)
To Scheduling:  Do one you ladies mind calling the patient to see if she got the consent I emailed her and if she would like to set up a virtual phone visit with Thurmond Butts.   Thanks!

## 2018-07-14 NOTE — Telephone Encounter (Signed)
lmov schedule EVisit

## 2018-07-16 NOTE — Telephone Encounter (Signed)
LMOVM (cell phone) for CB.

## 2018-07-23 NOTE — Telephone Encounter (Signed)
No ans no vm   3 x attempts to schedule recall entered removing from COVID pool

## 2018-07-24 NOTE — Telephone Encounter (Signed)
Unable to reach x 3 .  Entered august recall.  Removing from pool

## 2018-07-25 ENCOUNTER — Emergency Department
Admission: EM | Admit: 2018-07-25 | Discharge: 2018-07-25 | Disposition: A | Discharge status: Home / Self Care | Payer: Medicaid Other | Attending: Emergency Medicine | Admitting: Emergency Medicine

## 2018-07-25 ENCOUNTER — Emergency Department: Payer: Medicaid Other

## 2018-07-25 ENCOUNTER — Other Ambulatory Visit: Payer: Self-pay

## 2018-07-25 DIAGNOSIS — E039 Hypothyroidism, unspecified: Secondary | ICD-10-CM | POA: Insufficient documentation

## 2018-07-25 DIAGNOSIS — I509 Heart failure, unspecified: Secondary | ICD-10-CM | POA: Diagnosis not present

## 2018-07-25 DIAGNOSIS — Z79899 Other long term (current) drug therapy: Secondary | ICD-10-CM | POA: Insufficient documentation

## 2018-07-25 DIAGNOSIS — F17210 Nicotine dependence, cigarettes, uncomplicated: Secondary | ICD-10-CM | POA: Insufficient documentation

## 2018-07-25 DIAGNOSIS — I2510 Atherosclerotic heart disease of native coronary artery without angina pectoris: Secondary | ICD-10-CM | POA: Diagnosis not present

## 2018-07-25 DIAGNOSIS — I130 Hypertensive heart and chronic kidney disease with heart failure and stage 1 through stage 4 chronic kidney disease, or unspecified chronic kidney disease: Secondary | ICD-10-CM | POA: Diagnosis not present

## 2018-07-25 DIAGNOSIS — K852 Alcohol induced acute pancreatitis without necrosis or infection: Secondary | ICD-10-CM | POA: Diagnosis not present

## 2018-07-25 DIAGNOSIS — N189 Chronic kidney disease, unspecified: Secondary | ICD-10-CM | POA: Insufficient documentation

## 2018-07-25 DIAGNOSIS — R079 Chest pain, unspecified: Secondary | ICD-10-CM | POA: Diagnosis present

## 2018-07-25 LAB — BASIC METABOLIC PANEL
Anion gap: 16 — ABNORMAL HIGH (ref 5–15)
BUN: 18 mg/dL (ref 6–20)
CO2: 21 mmol/L — ABNORMAL LOW (ref 22–32)
Calcium: 9.3 mg/dL (ref 8.9–10.3)
Chloride: 106 mmol/L (ref 98–111)
Creatinine, Ser: 1.15 mg/dL — ABNORMAL HIGH (ref 0.44–1.00)
GFR calc Af Amer: >60 mL/min (ref >60)
GFR calc non Af Amer: 56 mL/min — ABNORMAL LOW (ref >60)
Glucose, Bld: 153 mg/dL — ABNORMAL HIGH (ref 70–99)
Potassium: 3.1 mmol/L — ABNORMAL LOW (ref 3.5–5.1)
Sodium: 143 mmol/L (ref 135–145)

## 2018-07-25 LAB — CBC
HCT: 47.6 % — ABNORMAL HIGH (ref 36.0–46.0)
Hemoglobin: 15.5 g/dL — ABNORMAL HIGH (ref 12.0–15.0)
MCH: 28.8 pg (ref 26.0–34.0)
MCHC: 32.6 g/dL (ref 30.0–36.0)
MCV: 88.3 fL (ref 80.0–100.0)
Platelets: 301 10*3/uL (ref 150–400)
RBC: 5.39 MIL/uL — ABNORMAL HIGH (ref 3.87–5.11)
RDW: 14.8 % (ref 11.5–15.5)
WBC: 11 10*3/uL — ABNORMAL HIGH (ref 4.0–10.5)
nRBC: 0 % (ref 0.0–0.2)

## 2018-07-25 LAB — HEPATIC FUNCTION PANEL
ALT: 25 U/L (ref 0–44)
AST: 28 U/L (ref 15–41)
Albumin: 4.3 g/dL (ref 3.5–5.0)
Alkaline Phosphatase: 120 U/L (ref 38–126)
Bilirubin, Direct: 0.3 mg/dL — ABNORMAL HIGH (ref 0.0–0.2)
Indirect Bilirubin: 0.7 mg/dL (ref 0.3–0.9)
Total Bilirubin: 1 mg/dL (ref 0.3–1.2)
Total Protein: 7.7 g/dL (ref 6.5–8.1)

## 2018-07-25 LAB — PROTIME-INR
INR: 1 (ref 0.8–1.2)
Prothrombin Time: 13.3 seconds (ref 11.4–15.2)

## 2018-07-25 LAB — TROPONIN I
Troponin I: 0.03 ng/mL (ref <0.03)
Troponin I: 0.03 ng/mL (ref <0.03)

## 2018-07-25 LAB — ETHANOL: Alcohol, Ethyl (B): <10 mg/dL (ref <10)

## 2018-07-25 LAB — LIPASE, BLOOD: Lipase: 109 U/L — ABNORMAL HIGH (ref 11–51)

## 2018-07-25 MED ORDER — PROMETHAZINE HCL 25 MG/ML IJ SOLN
6.2500 mg | Freq: Once | INTRAMUSCULAR | Status: AC
  Administered 2018-07-25: 6.25 mg via INTRAVENOUS
  Filled 2018-07-25: qty 1

## 2018-07-25 MED ORDER — MORPHINE SULFATE (PF) 4 MG/ML IV SOLN
4.0000 mg | Freq: Once | INTRAVENOUS | Status: AC
  Administered 2018-07-25: 4 mg via INTRAVENOUS
  Filled 2018-07-25: qty 1

## 2018-07-25 MED ORDER — ONDANSETRON HCL 4 MG PO TABS: 4.0000 mg | ORAL_TABLET | Freq: Three times a day (TID) | ORAL | 0 refills | Status: DC | PRN

## 2018-07-25 MED ORDER — SODIUM CHLORIDE 0.9 % IV BOLUS: 1000.0000 mL | Freq: Once | INTRAVENOUS | Status: DC

## 2018-07-25 MED ORDER — LORAZEPAM 2 MG/ML IJ SOLN
1.0000 mg | Freq: Once | INTRAMUSCULAR | Status: AC
  Administered 2018-07-25: 1 mg via INTRAVENOUS
  Filled 2018-07-25: qty 1

## 2018-07-25 MED ORDER — SODIUM CHLORIDE 0.9 % IV BOLUS
500.0000 mL | Freq: Once | INTRAVENOUS | Status: AC
  Administered 2018-07-25: 500 mL via INTRAVENOUS

## 2018-07-25 NOTE — Discharge Instructions (Addendum)
Do not drink alcohol, it will cause you to pancreatitis, if you have increased pain, fever, vomiting, chest pain, shortness of breath or you feel worse in any way return to the emergency department.  Take your medications as prescribed.

## 2018-07-25 NOTE — ED Triage Notes (Signed)
Pt comes from home via EMS with n/v and CP. Tender with palpation per EMS. EKG normal per EMS. Pt recently admitted here for pancreatitis. Pt was 200/130 with EMS. 22G in right hand. Pt has had 4mg  zofran, 1 nitro spray, and 324mg  aspirin.

## 2018-07-25 NOTE — ED Notes (Signed)
Pt given food and water for PO challenge. Pt calm, cooperative, and reports she is not in pain and is breathing much better.

## 2018-07-25 NOTE — ED Provider Notes (Addendum)
[facility] Emergency Department Provider Note  ____________________________________________   I have reviewed the triage vital signs and the nursing notes. Where available I have reviewed prior notes and, if possible and indicated, outside hospital notes.    HISTORY  Chief Complaint Chest Pain and Nausea    HPI Angelica Herrera is a 48 y.o. female with a history of multiple different medical problems including CHF CAD hypertension EtOH abuse, pancreatitis, about a month ago with similar symptoms after having "2 drinks" at that time, states she drank alcohol again for the first time "about" since she was here last time and she woke up this morning with epigastric abdominal discomfort nausea loose stools a little bit of vomiting no hematemesis no melena and a burning discomfort in her epigastric region similar to prior episodes of post alcoholic pancreatic flares.  Patient denies any chest pain or shortness of breath.  She states she is been under a lot of stress but she has no HI or SI she did not take an overdose.  She states she had to "[employer]" yesterday Does not have history of alcohol withdrawal she states, and is noncompliant with her hypertensive medications.  No other alleviating or aggravating factors no other prior symptoms no other intervention.  Pain does not seem to radiate at this time.  During her last visit here she did have a CT scan for similar symptoms which was quite reassuring, she does have a history of cardiomegaly and hepatomegaly which are likely EtOH influenced but those were more likely chronic than acute findings.  She has no exertional or positional plaints at this time   Past Medical History:  Diagnosis Date  . Arthritis    gout  . CAD (coronary artery disease)    a. 02/2012 s/p anterolateral STEMI/Cath/PCI: LM 100 (3.0x15 DES ), LAD sm, severely diseased (PTCA w/ 1.53mm balloon), LCX nl, RI moderate size, nl, RCA 30-21m,  AM/PDA/PLA nl, R->L collats noted; b. cath 08/03/15: widely patent ostLM stent at 10%, mLAD 60%,  mRCA 30%, EF 35-40%, ant wall HK, mildly elevated LVEDP  . CHF (congestive heart failure) ([place])   . CKD (chronic kidney disease), stage IV ([place])   . HTN (hypertension)   . Hyperlipidemia   . Hypothyroidism   . Ischemic cardiomyopathy    a. 02/2012 Echo: EF 40-45%, ant/lat HK, Gr1 DD, Mild MR, PASP 73mmHg.  Marland Kitchen NSVT (nonsustained ventricular tachycardia) ([place])    a. 02/2012 post-MI  . Obesity   . S/P primary angioplasty with coronary stent 03/04/2018   Dual antiplatelet therapy INDEFINITELY per cardiology note Nov 2019  . Tobacco abuse     Patient Active Problem List   Diagnosis Date Noted  . S/P primary angioplasty with coronary stent 03/04/2018  . Depression, recurrent ([place]) 12/02/2017  . Chronic systolic heart failure ([place]) 11/21/2017  . Controlled substance agreement signed 09/07/2015  . Coronary artery disease involving native coronary artery   . Allergic rhinitis due to pollen 06/28/2015  . GAD (generalized anxiety disorder) 03/17/2015  . Degenerative disc disease, cervical 03/17/2015  . Hyperlipidemia   . Obesity   . CKD (chronic kidney disease), stage IV ([place])   . Ischemic cardiomyopathy   . NSVT (nonsustained ventricular tachycardia) ([place])   . Hypothyroidism   . History of acute anterior wall MI 02/12/2012  . Essential hypertension, malignant 02/12/2012  . Tobacco abuse 02/12/2012    Past Surgical History:  Procedure Laterality Date  . ABDOMINAL HYSTERECTOMY    . CARDIAC  CATHETERIZATION  2013   [facility] s/p stent  . CARDIAC CATHETERIZATION Left 08/03/2015   Procedure: Left Heart Cath and Coronary Angiography;  Surgeon: Wellington Hampshire, MD;  Location: [place] CV LAB;  Service: Cardiovascular;  Laterality: Left;  . DILATION AND CURETTAGE OF UTERUS    . LEFT HEART CATHETERIZATION WITH CORONARY ANGIOGRAM N/A 02/11/2012   Procedure: LEFT HEART CATHETERIZATION WITH CORONARY  ANGIOGRAM;  Surgeon: [name], MD;  Location: [facility] CATH LAB;  Service: Cardiovascular;  Laterality: N/A;    Prior to Admission medications   Medication Sig Start Date End Date Taking? Authorizing Provider  amoxicillin-clavulanate (AUGMENTIN) 875-125 MG tablet Take 1 tablet by mouth 2 (two) times daily. 07/08/18   Poulose, Bethel Born, NP  aspirin 81 MG tablet Take 1 tablet (81 mg total) by mouth daily. 02/15/12   Theora Gianotti, NP  atorvastatin (LIPITOR) 40 MG tablet TAKE 1 TABLET AT BEDTIME NIGHTLY FOR CHOLESTEROL Patient taking differently: Take 40 mg by mouth at bedtime.  07/16/17   Minna Merritts, MD  benzonatate (TESSALON) 100 MG capsule Take 1-2 capsules (100-200 mg total) by mouth 3 (three) times daily as needed for cough. 07/08/18   Poulose, Bethel Born, NP  clopidogrel (PLAVIX) 75 MG tablet TAKE ONE (1) TABLET BY MOUTH ONCE DAILY 03/16/18   Wellington Hampshire, MD  Colchicine (MITIGARE) 0.6 MG CAPS Two tablets by mouth once, then one pill one hour later; then one pill per day until flare resolves. No atorvastatin during medication 02/22/18   Laban Emperor, PA-C  furosemide (LASIX) 20 MG tablet Take 1 tablet (20 mg total) by mouth daily. 02/27/18   [name], PA-C  gabapentin (NEURONTIN) 300 MG capsule Take 1 capsule (300 mg total) by mouth 2 (two) times daily. 07/08/18   Poulose, Bethel Born, NP  levothyroxine (SYNTHROID, LEVOTHROID) 112 MCG tablet Take 1 tablet (112 mcg total) by mouth daily before breakfast. 07/08/18   Poulose, Bethel Born, NP  lisinopril (PRINIVIL,ZESTRIL) 10 MG tablet TAKE ONE (1) TABLET BY MOUTH ONCE DAILY Patient not taking: Reported on 07/08/2018 07/16/17   [name], PA-C  loratadine (CLARITIN) 10 MG tablet Take 1 tablet (10 mg total) by mouth daily. 07/08/18   Poulose, Bethel Born, NP  magnesium oxide (MAG-OX) 400 MG tablet Take 1 tablet (400 mg total) by mouth daily. Patient not taking: Reported on 07/08/2018 05/09/16   [name], PA-C  metoprolol  succinate (TOPROL XL) 25 MG 24 hr tablet Take 0.5 tablets (12.5 mg total) by mouth daily. 02/27/18   Dunn, Areta Haber, PA-C  nitroGLYCERIN (NITROSTAT) 0.4 MG SL tablet Place 1 tablet (0.4 mg total) under the tongue every 5 (five) minutes x 3 doses as needed for chest pain. 02/15/12   Theora Gianotti, NP  PARoxetine (PAXIL) 40 MG tablet Take 1 tablet (40 mg total) by mouth daily. 07/08/18   Poulose, Bethel Born, NP  potassium chloride SA (K-DUR,KLOR-CON) 20 MEQ tablet Take 20 mEq by mouth daily.    [provider]  predniSONE (DELTASONE) 10 MG tablet Take 6 tablets day 1, take 5 tablets day 2, take 4 tablets day 3, take 3 tablets day 4, take 2 tablets day 5, take 1 tablet day 6 Patient not taking: Reported on 07/08/2018 02/22/18   Laban Emperor, PA-C  promethazine (PHENERGAN) 25 MG tablet Take 1 tablet (25 mg total) by mouth every 6 (six) hours as needed for nausea or vomiting. 11/09/17   Johnn Hai, PA-C  Salicylic Acid  26 % SOLN Apply 1 drop topically daily. Soak wart in warm water for 5 min and dry foot prior to medication Patient not taking: Reported on 07/08/2018 02/22/18   Laban Emperor, PA-C  traMADol (ULTRAM) 50 MG tablet Take 1 tablet (50 mg total) by mouth every 6 (six) hours as needed. 06/26/18 06/26/19  Lavonia Drafts, MD    Allergies Patient has no known allergies.  Family History  Problem Relation Age of Onset  . Hypertension Father   . Thyroid disease Father   . AAA (abdominal aortic aneurysm) Father   . Hypertension Mother   . Hyperlipidemia Mother   . Diabetes Mother   . Kidney disease Mother   . Hypertension Sister   . Hypertension Sister   . Supraventricular tachycardia Sister   . Hypertension Brother   . Hypertension Brother   . Hypertension Daughter   . Cancer Paternal Grandfather        lung    Social History Social History   Tobacco Use  . Smoking status: Current Every Day Smoker    Packs/day: 0.25    Years: 30.00    Pack years: 7.50     Types: Cigarettes  . Smokeless tobacco: Never Used  Substance Use Topics  . Alcohol use: Yes  . Drug use: No    Types: Marijuana    Review of Systems Constitutional: No fever/chills Eyes: No visual changes. ENT: No sore throat. No stiff neck no neck pain Cardiovascular: Denies chest pain. Respiratory: Denies shortness of breath. Gastrointestinal:   See HPI Genitourinary: Negative for dysuria. Musculoskeletal: Negative lower extremity swelling Skin: Negative for rash. Neurological: Negative for severe headaches, focal weakness or numbness.   ____________________________________________   PHYSICAL EXAM:  VITAL SIGNS: ED Triage Vitals  Enc Vitals Group     BP 07/25/18 0912 (!) 183/128     Pulse Rate 07/25/18 0912 77     Resp 07/25/18 0912 14     Temp 07/25/18 0912 (!) 97.4 F (36.3 C)     Temp Source 07/25/18 0912 Oral     SpO2 07/25/18 0912 100 %     Weight 07/25/18 0911 169 lb 12.1 oz (77 kg)     Height 07/25/18 0911 5\' 7"  (1.702 m)     Head Circumference --      Peak Flow --      Pain Score 07/25/18 0911 10     Pain Loc --      Pain Edu? --      Excl. in [place]? --     Constitutional: Alert and oriented.  Appears to be a little bit uncomfortable and she appears to be  anxious does not appear to be toxic Eyes: Conjunctivae are normal Head: Atraumatic HEENT: No congestion/rhinnorhea. Mucous membranes are moist.  Oropharynx non-erythematous Neck:   Nontender with no meningismus, no masses, no stridor Cardiovascular: Normal rate, regular rhythm. Grossly normal heart sounds.  Good peripheral circulation. Respiratory: Normal respiratory effort.  No retractions. Lungs CTAB. Abdominal: Soft and positive epigastric discomfort which reproduces her pain. No distention. No guarding no rebound Back:  There is no focal tenderness or step off.  there is no midline tenderness there are no lesions noted. there is no CVA tenderness Musculoskeletal: No lower extremity tenderness, no  upper extremity tenderness. No joint effusions, no DVT signs strong distal pulses no edema Neurologic:  Normal speech and language. No gross focal neurologic deficits are appreciated.  Skin:  Skin is warm, dry and intact. No rash noted. Psychiatric:  Mood and affect are she is upset, does appear to be nearly tearful speech and behavior are normal.  ____________________________________________   LABS (all labs ordered are listed, but only abnormal results are displayed)  Labs Reviewed  BASIC METABOLIC PANEL  CBC  TROPONIN I  PROTIME-INR  HEPATIC FUNCTION PANEL  LIPASE, BLOOD  ETHANOL    Pertinent labs  results that were available during my care of the patient were reviewed by me and considered in my medical decision making (see chart for details). ____________________________________________  EKG  I personally interpreted any EKGs ordered by me or triage Sinus rhythm, partial RBBB and LAFB, LVH no acute ST elevation or depression, No significant change from November 2019. ____________________________________________  RADIOLOGY  Pertinent labs & imaging results that were available during my care of the patient were reviewed by me and considered in my medical decision making (see chart for details). If possible, patient and/or family made aware of any abnormal findings.  No results found. ____________________________________________    PROCEDURES  Procedure(s) performed: None  Procedures  Critical Care performed: Sinus rhythm, partial RBBB and LAFB, LVH no acute ST elevation or depression, No significant change from November 2019. ____________________________________________   INITIAL IMPRESSION / ASSESSMENT AND PLAN / ED COURSE  Pertinent labs & imaging results that were available during my care of the patient were reviewed by me and considered in my medical decision making (see chart for details).  With history of EtOH influence elevation of pancreatic enzymes with  a recent CT scan which did not show any oncologic processes or obstruction, presents today with epigastric abdominal pain after again drinking alcohol last night.  I do not think this represents ACS PE or dissection but given her history I will obtain a cardiac enzyme.  This is been going on for several hours and not certain serial enzymes will be indicated given my very low pretest probability for that particular disease process.  Most likely this is an alcohol related epigastric discomfort either alcoholic gastritis mild pancreatitis or something similar.  She does not have a surgical abdomen at this time.  We will treat her nausea and pains and I have counseled her extensively about alcohol abuse we will reassess after blood work is back.  I do not see any indication for acute imaging given this history.  ----------------------------------------- 2:23 PM on 07/25/2018 -----------------------------------------  Patient here with mild pancreatitis after drinking last night cardiac enzymes are reassuring, patient awake and alert no acute distress feels 100% better, advised her not to drive home from here.  Advised not to drink alcohol anymore.  She is in no acute distress we will discharge.    ____________________________________________   FINAL CLINICAL IMPRESSION(S) / ED DIAGNOSES  Final diagnoses:  None      This chart was dictated using voice recognition software.  Despite best efforts to proofread,  errors can occur which can change meaning.      Schuyler Amor, MD 07/25/18 1655    Schuyler Amor, MD 07/25/18 1424

## 2018-07-25 NOTE — ED Notes (Signed)
Pt placed on 2L [place] for sats dropping to 88% while sleeping. Pt HOB sat up and pt resting comfortably. Pt sats now at 97%.

## 2018-07-25 NOTE — ED Notes (Signed)
Patient transported to X-ray 

## 2018-07-25 NOTE — ED Notes (Signed)
Pt states that she has not taken her BP meds the past two days because it's been making her feel sick.

## 2018-07-29 ENCOUNTER — Other Ambulatory Visit: Payer: Self-pay | Admitting: Cardiovascular Disease

## 2018-08-28 ENCOUNTER — Other Ambulatory Visit: Payer: Self-pay | Admitting: Cardiovascular Disease

## 2018-10-15 ENCOUNTER — Other Ambulatory Visit: Payer: Self-pay | Admitting: Nurse Practitioner

## 2018-10-15 ENCOUNTER — Other Ambulatory Visit: Payer: Self-pay | Admitting: Cardiovascular Disease

## 2018-10-15 DIAGNOSIS — R059 Cough, unspecified: Secondary | ICD-10-CM

## 2018-10-15 DIAGNOSIS — J0110 Acute frontal sinusitis, unspecified: Secondary | ICD-10-CM

## 2018-10-15 DIAGNOSIS — R05 Cough: Secondary | ICD-10-CM

## 2018-10-15 NOTE — Telephone Encounter (Signed)
Patient needs an appointment for further refills.

## 2018-10-20 NOTE — Telephone Encounter (Signed)
lmov to schedule  °

## 2018-10-20 NOTE — Telephone Encounter (Signed)
LMOV to schedule  
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
## 2018-10-26 NOTE — Telephone Encounter (Signed)
Scheduled

## 2018-10-27 ENCOUNTER — Ambulatory Visit: Payer: Medicaid Other | Admitting: Physician Assistant

## 2018-11-12 ENCOUNTER — Emergency Department: Payer: Medicaid Other

## 2018-11-12 ENCOUNTER — Other Ambulatory Visit: Payer: Self-pay

## 2018-11-12 ENCOUNTER — Inpatient Hospital Stay
Admission: EM | Admit: 2018-11-12 | Discharge: 2018-11-14 | DRG: 305 | Disposition: A | Discharge status: Home / Self Care | Payer: Medicaid Other | Attending: Internal Medicine | Admitting: Internal Medicine

## 2018-11-12 ENCOUNTER — Encounter: Payer: Self-pay | Admitting: Emergency Medicine

## 2018-11-12 DIAGNOSIS — I708 Atherosclerosis of other arteries: Secondary | ICD-10-CM | POA: Diagnosis present

## 2018-11-12 DIAGNOSIS — Z8249 Family history of ischemic heart disease and other diseases of the circulatory system: Secondary | ICD-10-CM

## 2018-11-12 DIAGNOSIS — R112 Nausea with vomiting, unspecified: Secondary | ICD-10-CM

## 2018-11-12 DIAGNOSIS — I255 Ischemic cardiomyopathy: Secondary | ICD-10-CM | POA: Diagnosis present

## 2018-11-12 DIAGNOSIS — Z90710 Acquired absence of both cervix and uterus: Secondary | ICD-10-CM

## 2018-11-12 DIAGNOSIS — Z716 Tobacco abuse counseling: Secondary | ICD-10-CM

## 2018-11-12 DIAGNOSIS — F411 Generalized anxiety disorder: Secondary | ICD-10-CM

## 2018-11-12 DIAGNOSIS — I130 Hypertensive heart and chronic kidney disease with heart failure and stage 1 through stage 4 chronic kidney disease, or unspecified chronic kidney disease: Secondary | ICD-10-CM | POA: Diagnosis present

## 2018-11-12 DIAGNOSIS — Z03818 Encounter for observation for suspected exposure to other biological agents ruled out: Secondary | ICD-10-CM | POA: Diagnosis not present

## 2018-11-12 DIAGNOSIS — Z79899 Other long term (current) drug therapy: Secondary | ICD-10-CM

## 2018-11-12 DIAGNOSIS — I10 Essential (primary) hypertension: Secondary | ICD-10-CM | POA: Diagnosis not present

## 2018-11-12 DIAGNOSIS — I5042 Chronic combined systolic (congestive) and diastolic (congestive) heart failure: Secondary | ICD-10-CM | POA: Diagnosis present

## 2018-11-12 DIAGNOSIS — Z7982 Long term (current) use of aspirin: Secondary | ICD-10-CM

## 2018-11-12 DIAGNOSIS — R918 Other nonspecific abnormal finding of lung field: Secondary | ICD-10-CM | POA: Diagnosis present

## 2018-11-12 DIAGNOSIS — I160 Hypertensive urgency: Secondary | ICD-10-CM | POA: Diagnosis not present

## 2018-11-12 DIAGNOSIS — E039 Hypothyroidism, unspecified: Secondary | ICD-10-CM | POA: Diagnosis present

## 2018-11-12 DIAGNOSIS — F3341 Major depressive disorder, recurrent, in partial remission: Secondary | ICD-10-CM

## 2018-11-12 DIAGNOSIS — E1151 Type 2 diabetes mellitus with diabetic peripheral angiopathy without gangrene: Secondary | ICD-10-CM | POA: Diagnosis present

## 2018-11-12 DIAGNOSIS — R079 Chest pain, unspecified: Secondary | ICD-10-CM | POA: Diagnosis not present

## 2018-11-12 DIAGNOSIS — Z955 Presence of coronary angioplasty implant and graft: Secondary | ICD-10-CM

## 2018-11-12 DIAGNOSIS — M109 Gout, unspecified: Secondary | ICD-10-CM | POA: Diagnosis present

## 2018-11-12 DIAGNOSIS — N186 End stage renal disease: Secondary | ICD-10-CM | POA: Diagnosis not present

## 2018-11-12 DIAGNOSIS — J301 Allergic rhinitis due to pollen: Secondary | ICD-10-CM | POA: Diagnosis present

## 2018-11-12 DIAGNOSIS — K760 Fatty (change of) liver, not elsewhere classified: Secondary | ICD-10-CM | POA: Diagnosis present

## 2018-11-12 DIAGNOSIS — E785 Hyperlipidemia, unspecified: Secondary | ICD-10-CM | POA: Diagnosis present

## 2018-11-12 DIAGNOSIS — Z79890 Hormone replacement therapy (postmenopausal): Secondary | ICD-10-CM

## 2018-11-12 DIAGNOSIS — Z20828 Contact with and (suspected) exposure to other viral communicable diseases: Secondary | ICD-10-CM | POA: Diagnosis present

## 2018-11-12 DIAGNOSIS — Z79891 Long term (current) use of opiate analgesic: Secondary | ICD-10-CM

## 2018-11-12 DIAGNOSIS — I2510 Atherosclerotic heart disease of native coronary artery without angina pectoris: Secondary | ICD-10-CM | POA: Diagnosis present

## 2018-11-12 DIAGNOSIS — R1013 Epigastric pain: Secondary | ICD-10-CM | POA: Diagnosis not present

## 2018-11-12 DIAGNOSIS — I252 Old myocardial infarction: Secondary | ICD-10-CM

## 2018-11-12 DIAGNOSIS — Z833 Family history of diabetes mellitus: Secondary | ICD-10-CM

## 2018-11-12 DIAGNOSIS — R7989 Other specified abnormal findings of blood chemistry: Secondary | ICD-10-CM | POA: Diagnosis present

## 2018-11-12 DIAGNOSIS — F418 Other specified anxiety disorders: Secondary | ICD-10-CM | POA: Diagnosis present

## 2018-11-12 DIAGNOSIS — R109 Unspecified abdominal pain: Secondary | ICD-10-CM | POA: Diagnosis not present

## 2018-11-12 DIAGNOSIS — I701 Atherosclerosis of renal artery: Secondary | ICD-10-CM | POA: Diagnosis present

## 2018-11-12 DIAGNOSIS — E876 Hypokalemia: Secondary | ICD-10-CM | POA: Diagnosis present

## 2018-11-12 DIAGNOSIS — F17210 Nicotine dependence, cigarettes, uncomplicated: Secondary | ICD-10-CM | POA: Diagnosis present

## 2018-11-12 DIAGNOSIS — Z9114 Patient's other noncompliance with medication regimen: Secondary | ICD-10-CM

## 2018-11-12 DIAGNOSIS — N184 Chronic kidney disease, stage 4 (severe): Secondary | ICD-10-CM | POA: Diagnosis present

## 2018-11-12 DIAGNOSIS — Z7902 Long term (current) use of antithrombotics/antiplatelets: Secondary | ICD-10-CM

## 2018-11-12 HISTORY — DX: Chronic combined systolic (congestive) and diastolic (congestive) heart failure: I50.42

## 2018-11-12 HISTORY — DX: Acute pancreatitis without necrosis or infection, unspecified: K85.90

## 2018-11-12 HISTORY — DX: Chest pain, unspecified: R07.9

## 2018-11-12 LAB — COMPREHENSIVE METABOLIC PANEL
ALT: 23 U/L (ref 0–44)
AST: 35 U/L (ref 15–41)
Albumin: 4.4 g/dL (ref 3.5–5.0)
Alkaline Phosphatase: 108 U/L (ref 38–126)
Anion gap: 17 — ABNORMAL HIGH (ref 5–15)
BUN: 21 mg/dL — ABNORMAL HIGH (ref 6–20)
CO2: 20 mmol/L — ABNORMAL LOW (ref 22–32)
Calcium: 9.7 mg/dL (ref 8.9–10.3)
Chloride: 106 mmol/L (ref 98–111)
Creatinine, Ser: 1.48 mg/dL — ABNORMAL HIGH (ref 0.44–1.00)
GFR calc Af Amer: 48 mL/min — ABNORMAL LOW (ref >60)
GFR calc non Af Amer: 41 mL/min — ABNORMAL LOW (ref >60)
Glucose, Bld: 165 mg/dL — ABNORMAL HIGH (ref 70–99)
Potassium: 3.4 mmol/L — ABNORMAL LOW (ref 3.5–5.1)
Sodium: 143 mmol/L (ref 135–145)
Total Bilirubin: 2 mg/dL — ABNORMAL HIGH (ref 0.3–1.2)
Total Protein: 7.6 g/dL (ref 6.5–8.1)

## 2018-11-12 LAB — URINALYSIS, COMPLETE (UACMP) WITH MICROSCOPIC
Bacteria, UA: NONE SEEN
Bilirubin Urine: NEGATIVE
Glucose, UA: 50 mg/dL — AB
Hgb urine dipstick: NEGATIVE
Ketones, ur: 5 mg/dL — AB
Leukocytes,Ua: NEGATIVE
Nitrite: NEGATIVE
Protein, ur: 100 mg/dL — AB
Specific Gravity, Urine: 1.029 (ref 1.005–1.030)
pH: 7 (ref 5.0–8.0)

## 2018-11-12 LAB — HEMOGLOBIN A1C
Hgb A1c MFr Bld: 5.9 % — ABNORMAL HIGH (ref 4.8–5.6)
Mean Plasma Glucose: 122.63 mg/dL

## 2018-11-12 LAB — LIPASE, BLOOD: Lipase: 45 U/L (ref 11–51)

## 2018-11-12 LAB — TROPONIN I (HIGH SENSITIVITY)
Troponin I (High Sensitivity): 40 ng/L — ABNORMAL HIGH (ref <18)
Troponin I (High Sensitivity): 40 ng/L — ABNORMAL HIGH (ref <18)

## 2018-11-12 LAB — CBC
HCT: 48.9 % — ABNORMAL HIGH (ref 36.0–46.0)
Hemoglobin: 15.9 g/dL — ABNORMAL HIGH (ref 12.0–15.0)
MCH: 29.3 pg (ref 26.0–34.0)
MCHC: 32.5 g/dL (ref 30.0–36.0)
MCV: 90.1 fL (ref 80.0–100.0)
Platelets: 305 10*3/uL (ref 150–400)
RBC: 5.43 MIL/uL — ABNORMAL HIGH (ref 3.87–5.11)
RDW: 16.2 % — ABNORMAL HIGH (ref 11.5–15.5)
WBC: 10.6 10*3/uL — ABNORMAL HIGH (ref 4.0–10.5)
nRBC: 0 % (ref 0.0–0.2)

## 2018-11-12 LAB — TSH: TSH: 1.739 u[IU]/mL (ref 0.350–4.500)

## 2018-11-12 LAB — SARS CORONAVIRUS 2 BY RT PCR (HOSPITAL ORDER, PERFORMED IN ~~LOC~~ HOSPITAL LAB): SARS Coronavirus 2: NEGATIVE

## 2018-11-12 MED ORDER — LISINOPRIL 10 MG PO TABS
10.0000 mg | ORAL_TABLET | Freq: Every day | ORAL | Status: DC
  Administered 2018-11-12: 10 mg via ORAL
  Filled 2018-11-12: qty 1

## 2018-11-12 MED ORDER — METOPROLOL SUCCINATE ER 25 MG PO TB24
12.5000 mg | ORAL_TABLET | Freq: Every day | ORAL | Status: DC
  Administered 2018-11-12: 12.5 mg via ORAL
  Filled 2018-11-12: qty 1

## 2018-11-12 MED ORDER — HYDRALAZINE HCL 20 MG/ML IJ SOLN: 10.0000 mg | Freq: Four times a day (QID) | INTRAMUSCULAR | Status: DC | PRN

## 2018-11-12 MED ORDER — ENOXAPARIN SODIUM 40 MG/0.4ML ~~LOC~~ SOLN
40.0000 mg | SUBCUTANEOUS | Status: DC
  Administered 2018-11-12 – 2018-11-13 (×2): 40 mg via SUBCUTANEOUS
  Filled 2018-11-12 (×2): qty 0.4

## 2018-11-12 MED ORDER — SODIUM CHLORIDE 0.9 % IV SOLN: 250.0000 mL | INTRAVENOUS | Status: DC | PRN

## 2018-11-12 MED ORDER — SODIUM CHLORIDE 0.9% FLUSH: 3.0000 mL | INTRAVENOUS | Status: DC | PRN

## 2018-11-12 MED ORDER — NITROGLYCERIN 0.4 MG SL SUBL: 0.4000 mg | SUBLINGUAL_TABLET | SUBLINGUAL | Status: DC | PRN

## 2018-11-12 MED ORDER — ATORVASTATIN CALCIUM 20 MG PO TABS
40.0000 mg | ORAL_TABLET | Freq: Every day | ORAL | Status: DC
  Administered 2018-11-12: 40 mg via ORAL
  Filled 2018-11-12: qty 2

## 2018-11-12 MED ORDER — GABAPENTIN 300 MG PO CAPS
300.0000 mg | ORAL_CAPSULE | Freq: Two times a day (BID) | ORAL | Status: DC
  Administered 2018-11-12 – 2018-11-14 (×3): 300 mg via ORAL
  Filled 2018-11-12 (×3): qty 1

## 2018-11-12 MED ORDER — LEVOTHYROXINE SODIUM 112 MCG PO TABS
112.0000 ug | ORAL_TABLET | Freq: Every day | ORAL | Status: DC
  Administered 2018-11-14: 112 ug via ORAL
  Filled 2018-11-12 (×2): qty 1

## 2018-11-12 MED ORDER — PAROXETINE HCL 20 MG PO TABS
40.0000 mg | ORAL_TABLET | Freq: Every day | ORAL | Status: DC
  Administered 2018-11-12 – 2018-11-14 (×3): 40 mg via ORAL
  Filled 2018-11-12 (×3): qty 2

## 2018-11-12 MED ORDER — SODIUM CHLORIDE 0.9% FLUSH
3.0000 mL | Freq: Two times a day (BID) | INTRAVENOUS | Status: DC
  Administered 2018-11-12 – 2018-11-14 (×3): 3 mL via INTRAVENOUS

## 2018-11-12 MED ORDER — SODIUM CHLORIDE 0.9 % IV SOLN: INTRAVENOUS | Status: DC

## 2018-11-12 MED ORDER — ACETAMINOPHEN 650 MG RE SUPP: 650.0000 mg | Freq: Four times a day (QID) | RECTAL | Status: DC | PRN

## 2018-11-12 MED ORDER — SODIUM CHLORIDE 0.9 % IV BOLUS
500.0000 mL | Freq: Once | INTRAVENOUS | Status: AC
  Administered 2018-11-12: 500 mL via INTRAVENOUS

## 2018-11-12 MED ORDER — ASPIRIN EC 81 MG PO TBEC
81.0000 mg | DELAYED_RELEASE_TABLET | Freq: Every day | ORAL | Status: DC
  Administered 2018-11-12 – 2018-11-14 (×2): 81 mg via ORAL
  Filled 2018-11-12 (×2): qty 1

## 2018-11-12 MED ORDER — CLOPIDOGREL BISULFATE 75 MG PO TABS
75.0000 mg | ORAL_TABLET | Freq: Every day | ORAL | Status: DC
  Administered 2018-11-12 – 2018-11-14 (×2): 75 mg via ORAL
  Filled 2018-11-12 (×2): qty 1

## 2018-11-12 MED ORDER — SODIUM CHLORIDE 0.9% FLUSH
3.0000 mL | Freq: Once | INTRAVENOUS | Status: AC
  Administered 2018-11-12: 3 mL via INTRAVENOUS

## 2018-11-12 MED ORDER — ONDANSETRON HCL 4 MG/2ML IJ SOLN
4.0000 mg | Freq: Four times a day (QID) | INTRAMUSCULAR | Status: DC | PRN
  Administered 2018-11-14: 4 mg via INTRAVENOUS
  Filled 2018-11-12 (×2): qty 2

## 2018-11-12 MED ORDER — ONDANSETRON HCL 4 MG/2ML IJ SOLN
4.0000 mg | Freq: Once | INTRAMUSCULAR | Status: AC
  Administered 2018-11-12: 4 mg via INTRAVENOUS
  Filled 2018-11-12: qty 2

## 2018-11-12 MED ORDER — ACETAMINOPHEN 325 MG PO TABS
650.0000 mg | ORAL_TABLET | Freq: Four times a day (QID) | ORAL | Status: DC | PRN
  Administered 2018-11-12: 650 mg via ORAL
  Filled 2018-11-12 (×2): qty 2

## 2018-11-12 MED ORDER — LABETALOL HCL 5 MG/ML IV SOLN
5.0000 mg | Freq: Once | INTRAVENOUS | Status: AC
  Administered 2018-11-12: 15:00:00 5 mg via INTRAVENOUS
  Filled 2018-11-12: qty 4

## 2018-11-12 MED ORDER — ONDANSETRON HCL 4 MG PO TABS: 4.0000 mg | ORAL_TABLET | Freq: Four times a day (QID) | ORAL | Status: DC | PRN

## 2018-11-12 MED ORDER — FENTANYL CITRATE (PF) 100 MCG/2ML IJ SOLN
50.0000 ug | INTRAMUSCULAR | Status: AC | PRN
  Administered 2018-11-12 (×2): 50 ug via INTRAVENOUS
  Filled 2018-11-12 (×2): qty 2

## 2018-11-12 MED ORDER — IOHEXOL 350 MG/ML SOLN
75.0000 mL | Freq: Once | INTRAVENOUS | Status: AC | PRN
  Administered 2018-11-12: 75 mL via INTRAVENOUS

## 2018-11-12 MED ORDER — MORPHINE SULFATE (PF) 2 MG/ML IV SOLN
2.0000 mg | INTRAVENOUS | Status: DC | PRN
  Administered 2018-11-12 – 2018-11-13 (×5): 2 mg via INTRAVENOUS
  Filled 2018-11-12 (×6): qty 1

## 2018-11-12 MED ORDER — SODIUM CHLORIDE 0.9 % IV BOLUS: 1000.0000 mL | Freq: Once | INTRAVENOUS | Status: DC

## 2018-11-12 MED ORDER — PROMETHAZINE HCL 25 MG/ML IJ SOLN
25.0000 mg | Freq: Four times a day (QID) | INTRAMUSCULAR | Status: DC | PRN
  Administered 2018-11-12 – 2018-11-14 (×6): 25 mg via INTRAVENOUS
  Filled 2018-11-12 (×6): qty 1

## 2018-11-12 NOTE — ED Triage Notes (Signed)
Pt encouraged to not force her finger down her throat to induce vomiting.

## 2018-11-12 NOTE — ED Provider Notes (Signed)
[facility] Emergency Department Provider Note    First MD Initiated Contact with Patient 11/12/18 1200     (approximate)  I have reviewed the triage vital signs and the nursing notes.   HISTORY  Chief Complaint Pancreatitis, Abdominal Pain, Emesis, and Nausea    HPI Angelica Herrera is a 48 y.o. female below listed past medical history presents the ER with severe epigastric abdominal pain that occurred suddenly this morning radiating through to her back.  States that she feels like this is recurrence of her pancreatitis.  Has not been able to keep any food or liquids down.  Has had innumerable episodes of nonbloody nonbilious emesis.  Denies any fevers.  No cough.  States that pain is moderate to severe.  Feels very nauseated.    Past Medical History:  Diagnosis Date  . Arthritis    gout  . CAD (coronary artery disease)    a. 02/2012 s/p anterolateral STEMI/Cath/PCI: LM 100 (3.0x15 DES ), LAD sm, severely diseased (PTCA w/ 1.53mm balloon), LCX nl, RI moderate size, nl, RCA 30-61m, AM/PDA/PLA nl, R->L collats noted; b. cath 08/03/15: widely patent ostLM stent at 10%, mLAD 60%,  mRCA 30%, EF 35-40%, ant wall HK, mildly elevated LVEDP  . CHF (congestive heart failure) ([place])   . CKD (chronic kidney disease), stage IV ([place])   . HTN (hypertension)   . Hyperlipidemia   . Hypothyroidism   . Ischemic cardiomyopathy    a. 02/2012 Echo: EF 40-45%, ant/lat HK, Gr1 DD, Mild MR, PASP 80mmHg.  Marland Kitchen NSVT (nonsustained ventricular tachycardia) ([place])    a. 02/2012 post-MI  . Obesity   . S/P primary angioplasty with coronary stent 03/04/2018   Dual antiplatelet therapy INDEFINITELY per cardiology note Nov 2019  . Tobacco abuse    Family History  Problem Relation Age of Onset  . Hypertension Father   . Thyroid disease Father   . AAA (abdominal aortic aneurysm) Father   . Hypertension Mother   . Hyperlipidemia Mother   . Diabetes Mother   . Kidney disease Mother    . Hypertension Sister   . Hypertension Sister   . Supraventricular tachycardia Sister   . Hypertension Brother   . Hypertension Brother   . Hypertension Daughter   . Cancer Paternal Grandfather        lung   Past Surgical History:  Procedure Laterality Date  . ABDOMINAL HYSTERECTOMY    . CARDIAC CATHETERIZATION  2013   [facility] s/p stent  . CARDIAC CATHETERIZATION Left 08/03/2015   Procedure: Left Heart Cath and Coronary Angiography;  Surgeon: Wellington Hampshire, MD;  Location: [facility];  Service: Cardiovascular;  Laterality: Left;  . DILATION AND CURETTAGE OF UTERUS    . LEFT HEART CATHETERIZATION WITH CORONARY ANGIOGRAM N/A 02/11/2012   Procedure: LEFT HEART CATHETERIZATION WITH CORONARY ANGIOGRAM;  Surgeon: [name], MD;  Location: [facility] CATH [facility];  Service: Cardiovascular;  Laterality: N/A;   Patient Active Problem List   Diagnosis Date Noted  . S/P primary angioplasty with coronary stent 03/04/2018  . Depression, recurrent ([place]) 12/02/2017  . Chronic systolic heart failure ([place]) 11/21/2017  . Controlled substance agreement signed 09/07/2015  . Coronary artery disease involving native coronary artery   . Allergic rhinitis due to pollen 06/28/2015  . GAD (generalized anxiety disorder) 03/17/2015  . Degenerative disc disease, cervical 03/17/2015  . Hyperlipidemia   . Obesity   . CKD (chronic kidney disease), stage IV ([place])   . Ischemic cardiomyopathy   .  NSVT (nonsustained ventricular tachycardia) ([place])   . Hypothyroidism   . History of acute anterior wall MI 02/12/2012  . Essential hypertension, malignant 02/12/2012  . Tobacco abuse 02/12/2012      Prior to Admission medications   Medication Sig Start Date End Date Taking? Authorizing Provider  ALLERGY RELIEF 10 MG tablet TAKE ONE TABLET BY MOUTH DAILY. 10/15/18   Poulose, Bethel Born, NP  amoxicillin-clavulanate (AUGMENTIN) 875-125 MG tablet Take 1 tablet by mouth 2 (two) times daily. 07/08/18   Poulose,  Bethel Born, NP  aspirin 81 MG tablet Take 1 tablet (81 mg total) by mouth daily. 02/15/12   Theora Gianotti, NP  atorvastatin (LIPITOR) 40 MG tablet TAKE (1) TABLET BY MOUTH DAILY AT BEDTIME FOR CHOLESTEROL. *NEED APPOINTMENT FOR FURTHER REFILLS* 10/26/18   Wellington Hampshire, MD  benzonatate (TESSALON) 100 MG capsule Take 1-2 capsules (100-200 mg total) by mouth 3 (three) times daily as needed for cough. 07/08/18   Poulose, Bethel Born, NP  clopidogrel (PLAVIX) 75 MG tablet TAKE (1) TABLET BY MOUTH EVERY DAY 08/28/18   Wellington Hampshire, MD  Colchicine (MITIGARE) 0.6 MG CAPS Two tablets by mouth once, then one pill one hour later; then one pill per day until flare resolves. No atorvastatin during medication 02/22/18   Laban Emperor, PA-C  furosemide (LASIX) 20 MG tablet Take 1 tablet (20 mg total) by mouth daily. 02/27/18   [name], PA-C  gabapentin (NEURONTIN) 300 MG capsule Take 1 capsule (300 mg total) by mouth 2 (two) times daily. 07/08/18   Poulose, Bethel Born, NP  levothyroxine (SYNTHROID, LEVOTHROID) 112 MCG tablet Take 1 tablet (112 mcg total) by mouth daily before breakfast. 07/08/18   Poulose, Bethel Born, NP  lisinopril (PRINIVIL,ZESTRIL) 10 MG tablet TAKE ONE (1) TABLET BY MOUTH ONCE DAILY Patient not taking: Reported on 07/08/2018 07/16/17   [name], PA-C  magnesium oxide (MAG-OX) 400 MG tablet Take 1 tablet (400 mg total) by mouth daily. Patient not taking: Reported on 07/08/2018 05/09/16   [name], PA-C  metoprolol succinate (TOPROL XL) 25 MG 24 hr tablet Take 0.5 tablets (12.5 mg total) by mouth daily. 02/27/18   Dunn, Areta Haber, PA-C  nitroGLYCERIN (NITROSTAT) 0.4 MG SL tablet Place 1 tablet (0.4 mg total) under the tongue every 5 (five) minutes x 3 doses as needed for chest pain. 02/15/12   Theora Gianotti, NP  ondansetron (ZOFRAN) 4 MG tablet Take 1 tablet (4 mg total) by mouth every 8 (eight) hours as needed for nausea or vomiting. 07/25/18   Schuyler Amor,  MD  PARoxetine (PAXIL) 40 MG tablet Take 1 tablet (40 mg total) by mouth daily. 07/08/18   Poulose, Bethel Born, NP  potassium chloride SA (K-DUR,KLOR-CON) 20 MEQ tablet Take 20 mEq by mouth daily.    [provider]  predniSONE (DELTASONE) 10 MG tablet Take 6 tablets day 1, take 5 tablets day 2, take 4 tablets day 3, take 3 tablets day 4, take 2 tablets day 5, take 1 tablet day 6 Patient not taking: Reported on 07/08/2018 02/22/18   Laban Emperor, PA-C  promethazine (PHENERGAN) 25 MG tablet Take 1 tablet (25 mg total) by mouth every 6 (six) hours as needed for nausea or vomiting. 11/09/17   Johnn Hai, PA-C  Salicylic Acid 26 % SOLN Apply 1 drop topically daily. Soak wart in warm water for 5 min and dry foot prior to medication Patient not taking: Reported on 07/08/2018 02/22/18  Laban Emperor, PA-C  traMADol (ULTRAM) 50 MG tablet Take 1 tablet (50 mg total) by mouth every 6 (six) hours as needed. 06/26/18 06/26/19  Lavonia Drafts, MD    Allergies Patient has no known allergies.    Social History Social History   Tobacco Use  . Smoking status: Current Every Day Smoker    Packs/day: 0.25    Years: 30.00    Pack years: 7.50    Types: Cigarettes  . Smokeless tobacco: Never Used  Substance Use Topics  . Alcohol use: Yes  . Drug use: No    Types: Marijuana    Review of Systems Patient denies headaches, rhinorrhea, blurry vision, numbness, shortness of breath, chest pain, edema, cough, abdominal pain, nausea, vomiting, diarrhea, dysuria, fevers, rashes or hallucinations unless otherwise stated above in HPI. ____________________________________________   PHYSICAL EXAM:  VITAL SIGNS: Vitals:   11/12/18 1400 11/12/18 1500  BP: (!) 166/99 (!) 159/101  Pulse: 81 88  Resp: 19 20  Temp:    SpO2: 98% 93%    Constitutional: Alert and oriented.  Eyes: Conjunctivae are normal.  Head: Atraumatic. Nose: No congestion/rhinnorhea. Mouth/Throat: Mucous membranes are  moist.   Neck: No stridor. Painless ROM.  Cardiovascular: Normal rate, regular rhythm. Grossly normal heart sounds.  Good peripheral circulation. Respiratory: Normal respiratory effort.  No retractions. Lungs CTAB. Gastrointestinal: Soft with mild epigastric ttp.  No distention. No abdominal bruits. No CVA tenderness. Genitourinary:  Musculoskeletal: No lower extremity tenderness nor edema.  No joint effusions. Neurologic:  Normal speech and language. No gross focal neurologic deficits are appreciated. No facial droop Skin:  Skin is warm, dry and intact. No rash noted. Psychiatric: Mood and affect are anxious. Speech and behavior are normal.  ____________________________________________   LABS (all labs ordered are listed, but only abnormal results are displayed)  Results for orders placed or performed during the hospital encounter of 11/12/18 (from the past 24 hour(s))  Lipase, blood     Status: None   Collection Time: 11/12/18 10:41 AM  Result Value Ref Range   Lipase 45 11 - 51 U/L  Comprehensive metabolic panel     Status: Abnormal   Collection Time: 11/12/18 10:41 AM  Result Value Ref Range   Sodium 143 135 - 145 mmol/L   Potassium 3.4 (L) 3.5 - 5.1 mmol/L   Chloride 106 98 - 111 mmol/L   CO2 20 (L) 22 - 32 mmol/L   Glucose, Bld 165 (H) 70 - 99 mg/dL   BUN 21 (H) 6 - 20 mg/dL   Creatinine, Ser 1.48 (H) 0.44 - 1.00 mg/dL   Calcium 9.7 8.9 - 10.3 mg/dL   Total Protein 7.6 6.5 - 8.1 g/dL   Albumin 4.4 3.5 - 5.0 g/dL   AST 35 15 - 41 U/L   ALT 23 0 - 44 U/L   Alkaline Phosphatase 108 38 - 126 U/L   Total Bilirubin 2.0 (H) 0.3 - 1.2 mg/dL   GFR calc non Af Amer 41 (L) >60 mL/min   GFR calc Af Amer 48 (L) >60 mL/min   Anion gap 17 (H) 5 - 15  CBC     Status: Abnormal   Collection Time: 11/12/18 10:41 AM  Result Value Ref Range   WBC 10.6 (H) 4.0 - 10.5 K/uL   RBC 5.43 (H) 3.87 - 5.11 MIL/uL   Hemoglobin 15.9 (H) 12.0 - 15.0 g/dL   HCT 48.9 (H) 36.0 - 46.0 %   MCV 90.1  80.0 - 100.0 fL  MCH 29.3 26.0 - 34.0 pg   MCHC 32.5 30.0 - 36.0 g/dL   RDW 16.2 (H) 11.5 - 15.5 %   Platelets 305 150 - 400 K/uL   nRBC 0.0 0.0 - 0.2 %  Troponin I (High Sensitivity)     Status: Abnormal   Collection Time: 11/12/18 10:41 AM  Result Value Ref Range   Troponin I (High Sensitivity) 40 (H) <18 ng/L  SARS Coronavirus 2 (CEPHEID- Performed in [facility] hospital lab), Hosp Order     Status: None   Collection Time: 11/12/18  1:42 PM   Specimen: Nasopharyngeal Swab  Result Value Ref Range   SARS Coronavirus 2 NEGATIVE NEGATIVE  Urinalysis, Complete w Microscopic     Status: Abnormal   Collection Time: 11/12/18  1:42 PM  Result Value Ref Range   Color, Urine STRAW (A) YELLOW   APPearance CLEAR (A) CLEAR   Specific Gravity, Urine 1.029 1.005 - 1.030   pH 7.0 5.0 - 8.0   Glucose, UA 50 (A) NEGATIVE mg/dL   Hgb urine dipstick NEGATIVE NEGATIVE   Bilirubin Urine NEGATIVE NEGATIVE   Ketones, ur 5 (A) NEGATIVE mg/dL   Protein, ur 100 (A) NEGATIVE mg/dL   Nitrite NEGATIVE NEGATIVE   Leukocytes,Ua NEGATIVE NEGATIVE   RBC / HPF 0-5 0 - 5 RBC/hpf   WBC, UA 0-5 0 - 5 WBC/hpf   Bacteria, UA NONE SEEN NONE SEEN   Squamous Epithelial / LPF 0-5 0 - 5   Mucus PRESENT   Troponin I (High Sensitivity)     Status: Abnormal   Collection Time: 11/12/18  1:42 PM  Result Value Ref Range   Troponin I (High Sensitivity) 40 (H) <18 ng/L   ____________________________________________  EKG My review and personal interpretation at Time: 12:47   Indication: htn  Rate: 85  Rhythm: sinus Axis: left Other: poor r wave progression, nonspecific st abn, no stemi criteria ____________________________________________  RADIOLOGY  I personally reviewed all radiographic images ordered to evaluate for the above acute complaints and reviewed radiology reports and findings.  These findings were personally discussed with the patient.  Please see medical record for radiology report.   ____________________________________________   PROCEDURES  Procedure(s) performed:  Procedures    Critical Care performed: no ____________________________________________   INITIAL IMPRESSION / ASSESSMENT AND PLAN / ED COURSE  Pertinent labs & imaging results that were available during my care of the patient were reviewed by me and considered in my medical decision making (see chart for details).   DDX: dissection, pancreatitis, sbo, perforation, acs, pna, covid, cholelithiasis, cholecystitis  Angelica Herrera is a 48 y.o. who presents to the ED with acute epigastric chest pain and back pain as described above.  Patient profoundly hypertensive retching and vomiting.  Symptoms and blood pressure may be secondary to pain but given her location and history with severely elevated blood pressure will order CTA to exclude dissection.  Will provide IV pain medication as well as IV antiemetics and IV fluids.  Clinical Course as of Nov 11 1504  Thu Nov 12, 2018  1407 Blood work shows hemoconcentration.  CT imaging reviewed and discussed my concern given her significantly elevated blood pressure with probable renal artery stenosis.  Given her persistent nausea vomiting persistent elevated blood pressures cardiac history not tolerating oral hydration that she should be admitted to the hospital for medical management including IV blood pressure titration and transition to oral regimen.   [PR]    Clinical Course User Index [PR] Quentin Cornwall,  Saralyn Pilar, MD    The patient was evaluated in Emergency Department today for the symptoms described in the history of present illness. He/she was evaluated in the context of the global COVID-19 pandemic, which necessitated consideration that the patient might be at risk for infection with the SARS-CoV-2 virus that causes COVID-19. Institutional protocols and algorithms that pertain to the evaluation of patients at risk for COVID-19 are in a state of rapid change  based on information released by regulatory bodies including the CDC and federal and state organizations. These policies and algorithms were followed during the patient's care in the ED.  As part of my medical decision making, I reviewed the following data within the [facility] notes reviewed and incorporated, Labs reviewed, notes from prior ED visits and [place] Controlled Substance Database   ____________________________________________   FINAL CLINICAL IMPRESSION(S) / ED DIAGNOSES  Final diagnoses:  Hypertensive urgency  Intractable nausea and vomiting      NEW MEDICATIONS STARTED DURING THIS VISIT:  New Prescriptions   No medications on file     Note:  This document was prepared using Dragon voice recognition software and may include unintentional dictation errors.    Merlyn Lot, MD 11/12/18 [phone]

## 2018-11-12 NOTE — ED Notes (Signed)
ED TO INPATIENT HANDOFF REPORT  ED Nurse Name and Phone #: Caryl Pina, [place]  S Name/Age/Gender Angelica Herrera 48 y.o. female Room/Bed: ED17A/ED17A  Code Status   Code Status: Not on file  Home/SNF/Other Home Patient oriented to: self, place, time and situation Is this baseline? Yes   Triage Complete: Triage complete  Chief Complaint Upper abdominal Pain  Triage Note Pt states hx of pancreatitis and reports she started with a flare up last night. Pt states pain is severe and she has nausea and vomiting as well. Pt reports no alcohol use in a month atleast.   Pt encouraged to not force her finger down her throat to induce vomiting.    Allergies No Known Allergies  Level of Care/Admitting Diagnosis ED Disposition    ED Disposition Condition [facility] Area: [place] [[id number]]  Level of Care: Telemetry [5]  Covid Evaluation: Confirmed COVID Negative  Diagnosis: Chest pain [161096]  Admitting Physician: Dustin Flock [[id number]]  Attending Physician: Dustin Flock [[id number]]  PT Class (Do Not Modify): Observation [104]  PT Acc Code (Do Not Modify): Observation [10022]       B Medical/Surgery History Past Medical History:  Diagnosis Date  . Arthritis    gout  . CAD (coronary artery disease)    a. 02/2012 s/p anterolateral STEMI/Cath/PCI: LM 100 (3.0x15 DES ), LAD sm, severely diseased (PTCA w/ 1.87mm balloon), LCX nl, RI moderate size, nl, RCA 30-35m, AM/PDA/PLA nl, R->L collats noted; b. cath 08/03/15: widely patent ostLM stent at 10%, mLAD 60%,  mRCA 30%, EF 35-40%, ant wall HK, mildly elevated LVEDP  . CHF (congestive heart failure) ([place])   . CKD (chronic kidney disease), stage IV ([place])   . HTN (hypertension)   . Hyperlipidemia   . Hypothyroidism   . Ischemic cardiomyopathy    a. 02/2012 Echo: EF 40-45%, ant/lat HK, Gr1 DD, Mild MR, PASP 72mmHg.  Marland Kitchen NSVT (nonsustained ventricular tachycardia) ([place])    a. 02/2012 post-MI   . Obesity   . S/P primary angioplasty with coronary stent 03/04/2018   Dual antiplatelet therapy INDEFINITELY per cardiology note Nov 2019  . Tobacco abuse    Past Surgical History:  Procedure Laterality Date  . ABDOMINAL HYSTERECTOMY    . CARDIAC CATHETERIZATION  2013   [facility] s/p stent  . CARDIAC CATHETERIZATION Left 08/03/2015   Procedure: Left Heart Cath and Coronary Angiography;  Surgeon: Wellington Hampshire, MD;  Location: [facility];  Service: Cardiovascular;  Laterality: Left;  . DILATION AND CURETTAGE OF UTERUS    . LEFT HEART CATHETERIZATION WITH CORONARY ANGIOGRAM N/A 02/11/2012   Procedure: LEFT HEART CATHETERIZATION WITH CORONARY ANGIOGRAM;  Surgeon: [name], MD;  Location: [facility] CATH LAB;  Service: Cardiovascular;  Laterality: N/A;     A IV Location/Drains/Wounds Patient Lines/Drains/Airways Status   Active Line/Drains/Airways    Name:   Placement date:   Placement time:   Site:   Days:   Peripheral IV 11/12/18 Left Hand   11/12/18    1039    Hand   less than 1   Peripheral IV 11/12/18 Left;Posterior Forearm   11/12/18    1310    Forearm   less than 1   Post Cath / Sheath 08/03/15 Right Arterial;Radial   08/03/15    0852    Arterial;Radial   1197          Intake/Output Last 24 hours  Intake/Output Summary (Last 24 hours) at 11/12/2018 1624  Last data filed at 11/12/2018 1308 Gross per 24 hour  Intake 500 ml  Output -  Net 500 ml    Labs/Imaging Results for orders placed or performed during the hospital encounter of 11/12/18 (from the past 48 hour(s))  Lipase, blood     Status: None   Collection Time: 11/12/18 10:41 AM  Result Value Ref Range   Lipase 45 11 - 51 U/L    Comment: Performed at [facility], [address] [place], [place] [address]  Comprehensive metabolic panel     Status: Abnormal   Collection Time: 11/12/18 10:41 AM  Result Value Ref Range   Sodium 143 135 - 145 mmol/L   Potassium 3.4 (L) 3.5 - 5.1 mmol/L    Comment:  HEMOLYSIS AT THIS LEVEL MAY AFFECT RESULT   Chloride 106 98 - 111 mmol/L   CO2 20 (L) 22 - 32 mmol/L   Glucose, Bld 165 (H) 70 - 99 mg/dL   BUN 21 (H) 6 - 20 mg/dL   Creatinine, Ser 1.48 (H) 0.44 - 1.00 mg/dL   Calcium 9.7 8.9 - 10.3 mg/dL   Total Protein 7.6 6.5 - 8.1 g/dL   Albumin 4.4 3.5 - 5.0 g/dL   AST 35 15 - 41 U/L    Comment: HEMOLYSIS AT THIS LEVEL MAY AFFECT RESULT   ALT 23 0 - 44 U/L    Comment: HEMOLYSIS AT THIS LEVEL MAY AFFECT RESULT   Alkaline Phosphatase 108 38 - 126 U/L   Total Bilirubin 2.0 (H) 0.3 - 1.2 mg/dL    Comment: HEMOLYSIS AT THIS LEVEL MAY AFFECT RESULT   GFR calc non Af Amer 41 (L) >60 mL/min   GFR calc Af Amer 48 (L) >60 mL/min   Anion gap 17 (H) 5 - 15    Comment: Performed at [facility], [address] [place], [place] [address]  CBC     Status: Abnormal   Collection Time: 11/12/18 10:41 AM  Result Value Ref Range   WBC 10.6 (H) 4.0 - 10.5 K/uL   RBC 5.43 (H) 3.87 - 5.11 MIL/uL   Hemoglobin 15.9 (H) 12.0 - 15.0 g/dL   HCT 48.9 (H) 36.0 - 46.0 %   MCV 90.1 80.0 - 100.0 fL   MCH 29.3 26.0 - 34.0 pg   MCHC 32.5 30.0 - 36.0 g/dL   RDW 16.2 (H) 11.5 - 15.5 %   Platelets 305 150 - 400 K/uL   nRBC 0.0 0.0 - 0.2 %    Comment: Performed at [facility], [place], [place] [address]  Troponin I (High Sensitivity)     Status: Abnormal   Collection Time: 11/12/18 10:41 AM  Result Value Ref Range   Troponin I (High Sensitivity) 40 (H) <18 ng/L    Comment: (NOTE) Elevated high sensitivity troponin I (hsTnI) values and significant  changes across serial measurements may suggest ACS but many other  chronic and acute conditions are known to elevate hsTnI results.  Refer to the "Links" section for chest pain algorithms and additional  guidance. Performed at [facility], [address], [place], [place] [address]   SARS Coronavirus 2 (CEPHEID- Performed in [facility] hospital lab), Hosp Order     Status:  None   Collection Time: 11/12/18  1:42 PM   Specimen: Nasopharyngeal Swab  Result Value Ref Range   SARS Coronavirus 2 NEGATIVE NEGATIVE    Comment: (NOTE) If result is NEGATIVE SARS-CoV-2 target nucleic acids are NOT  DETECTED. The SARS-CoV-2 RNA is generally detectable in upper and lower  respiratory specimens during the acute phase of infection. The lowest  concentration of SARS-CoV-2 viral copies this assay can detect is 250  copies / mL. A negative result does not preclude SARS-CoV-2 infection  and should not be used as the sole basis for treatment or other  patient management decisions.  A negative result may occur with  improper specimen collection / handling, submission of specimen other  than nasopharyngeal swab, presence of viral mutation(s) within the  areas targeted by this assay, and inadequate number of viral copies  (<250 copies / mL). A negative result must be combined with clinical  observations, patient history, and epidemiological information. If result is POSITIVE SARS-CoV-2 target nucleic acids are DETECTED. The SARS-CoV-2 RNA is generally detectable in upper and lower  respiratory specimens dur ing the acute phase of infection.  Positive  results are indicative of active infection with SARS-CoV-2.  Clinical  correlation with patient history and other diagnostic information is  necessary to determine patient infection status.  Positive results do  not rule out bacterial infection or co-infection with other viruses. If result is PRESUMPTIVE POSTIVE SARS-CoV-2 nucleic acids MAY BE PRESENT.   A presumptive positive result was obtained on the submitted specimen  and confirmed on repeat testing.  While 2019 novel coronavirus  (SARS-CoV-2) nucleic acids may be present in the submitted sample  additional confirmatory testing may be necessary for epidemiological  and / or clinical management purposes  to differentiate between  SARS-CoV-2 and other Sarbecovirus currently  known to infect humans.  If clinically indicated additional testing with an alternate test  methodology [phone]) is advised. The SARS-CoV-2 RNA is generally  detectable in upper and lower respiratory sp ecimens during the acute  phase of infection. The expected result is Negative. Fact Sheet for Patients:  StrictlyIdeas.no Fact Sheet for Healthcare Providers: BankingDealers.co.za This test is not yet approved or cleared by the [place] FDA and has been authorized for detection and/or diagnosis of SARS-CoV-2 by FDA under an Emergency Use Authorization (EUA).  This EUA will remain in effect (meaning this test can be used) for the duration of the COVID-19 declaration under Section 564(b)(1) of the Act, 21 U.S.C. section 360bbb-3(b)(1), unless the authorization is terminated or revoked sooner. Performed at [facility], [address] [place], [place] [address]   Urinalysis, Complete w Microscopic     Status: Abnormal   Collection Time: 11/12/18  1:42 PM  Result Value Ref Range   Color, Urine STRAW (A) YELLOW   APPearance CLEAR (A) CLEAR   Specific Gravity, Urine 1.029 1.005 - 1.030   pH 7.0 5.0 - 8.0   Glucose, UA 50 (A) NEGATIVE mg/dL   Hgb urine dipstick NEGATIVE NEGATIVE   Bilirubin Urine NEGATIVE NEGATIVE   Ketones, ur 5 (A) NEGATIVE mg/dL   Protein, ur 100 (A) NEGATIVE mg/dL   Nitrite NEGATIVE NEGATIVE   Leukocytes,Ua NEGATIVE NEGATIVE   RBC / HPF 0-5 0 - 5 RBC/hpf   WBC, UA 0-5 0 - 5 WBC/hpf   Bacteria, UA NONE SEEN NONE SEEN   Squamous Epithelial / LPF 0-5 0 - 5   Mucus PRESENT     Comment: Performed at [facility], [address] [place], [place] [address]  Troponin I (High Sensitivity)     Status: Abnormal   Collection Time: 11/12/18  1:42 PM  Result Value Ref Range   Troponin I (High Sensitivity) 40 (H) <18  ng/L    Comment: (NOTE) Elevated high sensitivity troponin I (hsTnI) values and  significant  changes across serial measurements may suggest ACS but many other  chronic and acute conditions are known to elevate hsTnI results.  Refer to the "Links" section for chest pain algorithms and additional  guidance. Performed at [facility], [place], [place] [address]    Ct Angio Chest/abd/pel For Dissection W And/or Wo Contrast  Result Date: 11/12/2018 CLINICAL DATA:  Chest and abdominal pain radiating toward back. Nausea and vomiting. EXAM: CT ANGIOGRAPHY CHEST, ABDOMEN AND PELVIS TECHNIQUE: Initially, axial CT images were obtained through the chest and upper abdomen without intravenous contrast material administration. Multidetector CT imaging through the chest, abdomen and pelvis was performed using the standard protocol during bolus administration of intravenous contrast. Multiplanar reconstructed images and MIPs were obtained and reviewed to evaluate the vascular anatomy. CONTRAST:  56mL OMNIPAQUE IOHEXOL 350 MG/ML SOLN COMPARISON:  CT abdomen and pelvis June 26, 2018 FINDINGS: CTA CHEST FINDINGS Cardiovascular: There is no intramural hematoma seen in the thoracic and upper abdominal aorta on noncontrast enhanced images. There is no demonstrable thoracic aortic aneurysm or dissection. There are foci of aortic atherosclerosis. The visualized great vessels appear unremarkable. The right innominate and left common carotid arteries arise as a common trunk, an anatomic variant. The left vertebral artery arises directly from the aortic arch, an anatomic variant. There is cardiomegaly with a focal pericardial effusion. The pericardium does not appear appreciably thickened. There are foci of coronary artery calcification. There is no demonstrable pulmonary embolus. Mediastinum/Nodes: Visualized portions of the thyroid appear quite diminutive without focal lesion. There is no demonstrable thoracic adenopathy. No esophageal lesions are appreciable. Lungs/Pleura: There  are bullae in the upper lobes peripherally. There is no appreciable edema or consolidation. There is atelectatic change in the left base region. There is also atelectasis along the medial aspect of the right middle lobe. No evident pleural effusion. On axial slice 78 series 6, there is a 4 mm nodular opacity in the right middle lobe. On axial slice 84 series 6, there is a 7 x 5 mm nodular opacity in the lateral segment of the right lower lobe. On axial slice 90 series 6, there is a 5 mm nodular opacity in the superior segment of the right lower lobe. On axial slice 95 series 6, there is a 4 mm nodular opacity abutting the pleura in the lateral segment of the right lower lobe. On axial slice 976 series 6, there is a 4 mm nodular opacity abutting the pleura in the lateral segment of the left lower lobe. On axial slice 734 series 6, there is a 2 mm nodular opacity in the inferior most aspect of the lingula. On axial slice 193 series 6, there is a 4 mm nodular opacity in the anterior segment of the left lower lobe. There is a 2 mm nodular opacity in the lateral segment of the left lower lobe on axial slice 790 series 6. Musculoskeletal: No blastic or lytic bone lesions. No chest wall lesions evident. Review of the MIP images confirms the above findings. CTA ABDOMEN AND PELVIS FINDINGS VASCULAR Aorta: There is no abdominal aortic aneurysm or dissection. There is atherosclerotic calcification throughout much of the abdominal aorta with areas of mild peripheral thrombus at several sites. Celiac: The celiac artery and its major branches appear widely patent with minimal atherosclerotic calcification at the origin of the celiac artery. No aneurysm or dissection involving these vessels evident.  SMA: Superior mesenteric artery arises immediately inferior to the superior mesenteric artery. The superior mesenteric artery and its major branches appear widely patent. No aneurysm or dissection. Renals: There is a single renal  artery on each side. There is focal calcification at the origin of the right renal artery with approximately 30% diameter stenosis. There is noncalcified plaque at the origin of the left renal artery with a focal area of short-segment narrowing of age in 90% period no aneurysm or dissection involves the renal arteries and their branches. No fibromuscular dysplasia evident on either side. IMA: Inferior mesenteric artery and its branches appear patent without aneurysm or dissection. Inflow: There is extensive plaque in each common iliac artery. There is also thrombus in the proximal common iliac arteries. There is approximately 50% diameter stenosis in the proximal right common iliac artery with 60-70% diameter stenosis in the proximal left common iliac artery. More distally in the left common iliac artery, there is approximately 75-80% diameter stenosis. There is moderate calcification at the origin of the left and right internal iliac arteries. There is approximately 50% diameter stenosis at the origin of the left external iliac artery. Milder atherosclerotic irregularity noted in the proximal right external iliac artery. The more distal external iliac arteries are patent. Each common femoral artery is patent. The proximal right superficial and profunda femoral arteries appear patent without aneurysm or dissection involving pelvic arterial vessels. Veins: No obvious venous abnormality within the limitations of this arterial phase study beyond apparent reflux of contrast into the hepatic veins and inferior vena cava. Review of the MIP images confirms the above findings. NON-VASCULAR Hepatobiliary: The liver contour is subtly nodular. There is hepatic steatosis. Liver measures 20.9 cm in length. No focal liver lesions are evident. The gallbladder wall is not appreciably thickened. There is no biliary duct dilatation. Pancreas: There is no appreciable pancreatic mass or inflammatory focus. Spleen: No splenic lesions  are evident. Adrenals/Urinary Tract: There is adrenal hypertrophy bilaterally, essentially stable. Kidneys bilaterally show no evident mass or hydronephrosis on either side. There is no appreciable renal or ureteral calculus on either side. Urinary bladder is midline with wall thickness within normal limits. Stomach/Bowel: There is no appreciable bowel wall or mesenteric thickening. There is no demonstrable bowel obstruction. Terminal ileum appears normal. There is no evident free air or portal venous air. Lymphatic: No evident adenopathy in the abdomen or pelvis. Reproductive: Uterus absent.  No evident pelvic mass. Other: No periappendiceal region inflammatory change. Appendix appears normal. No abscess or ascites evident in the abdomen or pelvis. Musculoskeletal: There is degenerative change in the lumbar spine. There is vacuum phenomenon at L5-S1. No blastic or lytic bone lesions. There is no intramuscular or abdominal wall lesion evident. Review of the MIP images confirms the above findings. IMPRESSION: CT angiogram chest: 1. No thoracic aortic aneurysm or dissection. There are foci of aortic atherosclerosis as well as foci of coronary artery calcification. 2. Cardiomegaly with pericardial effusion, findings noted previously. 3.  No demonstrable pulmonary embolus. 4. Multiple pulmonary nodular opacities, largest measuring 7 x 5 mm. Non-contrast chest CT at 6-12 months is recommended. If the nodule is stable at time of repeat CT, then future CT at 18-24 months (from today's scan) is considered optional for low-risk patients, but is recommended for high-risk patients. This recommendation follows the consensus statement: Guidelines for Management of Incidental Pulmonary Nodules Detected on CT Images: From the Fleischner Society 2017; Radiology 2017; 284:228-243. No lung edema or consolidation. 5.  No evident thoracic  adenopathy. CT angiogram abdomen; CT angiogram pelvis: 1. No aneurysm or dissection involving the  aorta, major mesenteric, and major pelvic arterial vessels. 2. Extensive aortic and proximal pelvic arterial atherosclerotic calcification. Areas of hemodynamically significant obstruction noted in the proximal left renal artery as well as in the left common iliac artery. 3. 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 There is also hepatic steatosis. No focal liver lesions appreciable on this study. 4. Reflux of contrast into the inferior vena cava and hepatic veins likely represents increase in right heart pressure. 5. No bowel obstruction. No abscess in the abdomen or pelvis. Appendix region appears normal. 6. No evident renal or ureteral calculus. No hydronephrosis. Urinary bladder wall thickness normal. 7. Stable adrenal hypertrophy bilaterally. Clinical significance of this finding uncertain. Appropriate laboratory correlation may be advisable, particularly if clinical symptoms should warrant. 8. Uterus absent. Electronically Signed   By: Lowella Grip III M.D.   On: 11/12/2018 13:57    Pending Labs Unresulted Labs (From admission, onward)    Start     Ordered   11/13/18 0500  Lipid panel  Tomorrow morning,   STAT     11/12/18 1545   Signed and Held  CBC  (enoxaparin (LOVENOX)    CrCl >/= 30 ml/min)  Once,   R    Comments: Baseline for enoxaparin therapy IF NOT ALREADY DRAWN.  Notify MD if PLT < 100 K.    Signed and Held   Signed and Held  Creatinine, serum  (enoxaparin (LOVENOX)    CrCl >/= 30 ml/min)  Once,   R    Comments: Baseline for enoxaparin therapy IF NOT ALREADY DRAWN.    Signed and Held   Signed and Held  Creatinine, serum  (enoxaparin (LOVENOX)    CrCl >/= 30 ml/min)  Weekly,   R    Comments: while on enoxaparin therapy    Signed and Held   Signed and Held  TSH  Once,   R     Signed and Held   Signed and Held  Hemoglobin A1c  Once,   R     Signed and Held   Signed and Held  CBC  Tomorrow morning,   R     Signed and Held   Signed and Held  Basic metabolic panel  Tomorrow  morning,   R     Signed and Held          Vitals/Pain Today's Vitals   11/12/18 1355 11/12/18 1400 11/12/18 1500 11/12/18 1515  BP:  (!) 166/99 (!) 159/101 (!) 156/92  Pulse: 88 81 88 85  Resp: 18 19 20 17   Temp:      TempSrc:      SpO2: 97% 98% 93% 92%  Weight:      Height:      PainSc:        Isolation Precautions No active isolations  Medications Medications  promethazine (PHENERGAN) injection 25 mg (25 mg Intravenous Given 11/12/18 1215)  hydrALAZINE (APRESOLINE) injection 10 mg (has no administration in time range)  morphine 2 MG/ML injection 2 mg (has no administration in time range)  sodium chloride flush (NS) 0.9 % injection 3 mL (3 mLs Intravenous Given 11/12/18 1216)  ondansetron (ZOFRAN) injection 4 mg (4 mg Intravenous Given 11/12/18 1039)  fentaNYL (SUBLIMAZE) injection 50 mcg (50 mcg Intravenous Given 11/12/18 1149)  sodium chloride 0.9 % bolus 500 mL (0 mLs Intravenous Stopped 11/12/18 1308)  labetalol (NORMODYNE) injection 5 mg (5 mg Intravenous Given 11/12/18  1501)  iohexol (OMNIPAQUE) 350 MG/ML injection 75 mL (75 mLs Intravenous Contrast Given 11/12/18 1316)    Mobility walks Low fall risk   Focused Assessments N/A   R Recommendations: See Admitting Provider Note  Report given to:   Additional Notes: N/A

## 2018-11-12 NOTE — ED Triage Notes (Signed)
Pt states hx of pancreatitis and reports she started with a flare up last night. Pt states pain is severe and she has nausea and vomiting as well. Pt reports no alcohol use in a month atleast.

## 2018-11-12 NOTE — H&P (Signed)
[facility] NAME: Angelica Herrera    MR#:  [id number]  DATE OF BIRTH:  October 03, 1970  DATE OF ADMISSION:  11/12/2018  PRIMARY CARE PHYSICIAN: Arnetha Courser, MD   REQUESTING/REFERRING PHYSICIAN: Merlyn Lot, MD  CHIEF COMPLAINT:   Chief Complaint  Patient presents with  . Pancreatitis  . Abdominal Pain  . Emesis  . Nausea    HISTORY OF PRESENT ILLNESS: Angelica Herrera  is a 48 y.o. female with a known history of multiple medical problems including coronary artery disease, congestive heart failure, chronic kidney disease, essential hypertension, hyperlipidemia, hypothyroidism, tobacco abuse who is presenting to the hospital with complaining of having abdominal pain as well as mid lower sternal chest pain.  Apparently patient has ran out of some of her blood pressure medications.  When she arrived in the emergency room blood pressure was 241/141.  Patient denies any fevers she does complain of occasional chills she also states that the abdomen pain was in the epigastric region.  Evaluation in the emergency room with the CTA of her abdomen shows significant arthrosclerotic disease in the arteries in her abdomen.    PAST MEDICAL HISTORY:   Past Medical History:  Diagnosis Date  . Arthritis    gout  . CAD (coronary artery disease)    a. 02/2012 s/p anterolateral STEMI/Cath/PCI: LM 100 (3.0x15 DES ), LAD sm, severely diseased (PTCA w/ 1.61mm balloon), LCX nl, RI moderate size, nl, RCA 30-58m, AM/PDA/PLA nl, R->L collats noted; b. cath 08/03/15: widely patent ostLM stent at 10%, mLAD 60%,  mRCA 30%, EF 35-40%, ant wall HK, mildly elevated LVEDP  . CHF (congestive heart failure) ([place])   . CKD (chronic kidney disease), stage IV ([place])   . HTN (hypertension)   . Hyperlipidemia   . Hypothyroidism   . Ischemic cardiomyopathy    a. 02/2012 Echo: EF 40-45%, ant/lat HK, Gr1 DD, Mild MR, PASP 68mmHg.  Marland Kitchen NSVT (nonsustained ventricular  tachycardia) ([place])    a. 02/2012 post-MI  . Obesity   . S/P primary angioplasty with coronary stent 03/04/2018   Dual antiplatelet therapy INDEFINITELY per cardiology note Nov 2019  . Tobacco abuse     PAST SURGICAL HISTORY:  Past Surgical History:  Procedure Laterality Date  . ABDOMINAL HYSTERECTOMY    . CARDIAC CATHETERIZATION  2013   [facility] s/p stent  . CARDIAC CATHETERIZATION Left 08/03/2015   Procedure: Left Heart Cath and Coronary Angiography;  Surgeon: Wellington Hampshire, MD;  Location: [facility];  Service: Cardiovascular;  Laterality: Left;  . DILATION AND CURETTAGE OF UTERUS    . LEFT HEART CATHETERIZATION WITH CORONARY ANGIOGRAM N/A 02/11/2012   Procedure: LEFT HEART CATHETERIZATION WITH CORONARY ANGIOGRAM;  Surgeon: [name], MD;  Location: [facility] CATH LAB;  Service: Cardiovascular;  Laterality: N/A;    SOCIAL HISTORY:  Social History   Tobacco Use  . Smoking status: Current Every Day Smoker    Packs/day: 0.25    Years: 30.00    Pack years: 7.50    Types: Cigarettes  . Smokeless tobacco: Never Used  Substance Use Topics  . Alcohol use: Yes    FAMILY HISTORY:  Family History  Problem Relation Age of Onset  . Hypertension Father   . Thyroid disease Father   . AAA (abdominal aortic aneurysm) Father   . Hypertension Mother   . Hyperlipidemia Mother   . Diabetes Mother   . Kidney disease Mother   . Hypertension Sister   .  Hypertension Sister   . Supraventricular tachycardia Sister   . Hypertension Brother   . Hypertension Brother   . Hypertension Daughter   . Cancer Paternal Grandfather        lung    DRUG ALLERGIES: No Known Allergies  REVIEW OF SYSTEMS:   CONSTITUTIONAL: No fever, fatigue or weakness.  EYES: No blurred or double vision.  EARS, NOSE, AND THROAT: No tinnitus or ear pain.  RESPIRATORY: No cough, shortness of breath, wheezing or hemoptysis.  CARDIOVASCULAR: Positive chest pain, orthopnea, edema.  GASTROINTESTINAL: No nausea,  vomiting, diarrhea or positive abdominal pain.  GENITOURINARY: No dysuria, hematuria.  ENDOCRINE: No polyuria, nocturia,  HEMATOLOGY: No anemia, easy bruising or bleeding SKIN: No rash or lesion. MUSCULOSKELETAL: No joint pain or arthritis.   NEUROLOGIC: No tingling, numbness, weakness.  PSYCHIATRY: No anxiety or depression.   MEDICATIONS AT HOME:  Prior to Admission medications   Medication Sig Start Date End Date Taking? Authorizing Provider  ALLERGY RELIEF 10 MG tablet TAKE ONE TABLET BY MOUTH DAILY. 10/15/18  Yes Poulose, Bethel Born, NP  aspirin 81 MG tablet Take 1 tablet (81 mg total) by mouth daily. 02/15/12  Yes Theora Gianotti, NP  atorvastatin (LIPITOR) 40 MG tablet TAKE (1) TABLET BY MOUTH DAILY AT BEDTIME FOR CHOLESTEROL. *NEED APPOINTMENT FOR FURTHER REFILLS* 10/26/18  Yes Wellington Hampshire, MD  clopidogrel (PLAVIX) 75 MG tablet TAKE (1) TABLET BY MOUTH EVERY DAY 08/28/18  Yes Wellington Hampshire, MD  Colchicine (MITIGARE) 0.6 MG CAPS Two tablets by mouth once, then one pill one hour later; then one pill per day until flare resolves. No atorvastatin during medication 02/22/18  Yes Laban Emperor, PA-C  furosemide (LASIX) 20 MG tablet Take 1 tablet (20 mg total) by mouth daily. 02/27/18  Yes Dunn, Areta Haber, PA-C  gabapentin (NEURONTIN) 300 MG capsule Take 1 capsule (300 mg total) by mouth 2 (two) times daily. 07/08/18  Yes Poulose, Bethel Born, NP  levothyroxine (SYNTHROID, LEVOTHROID) 112 MCG tablet Take 1 tablet (112 mcg total) by mouth daily before breakfast. 07/08/18  Yes Poulose, Bethel Born, NP  lisinopril (PRINIVIL,ZESTRIL) 10 MG tablet TAKE ONE (1) TABLET BY MOUTH ONCE DAILY 07/16/17  Yes Dunn, Areta Haber, PA-C  metoprolol succinate (TOPROL XL) 25 MG 24 hr tablet Take 0.5 tablets (12.5 mg total) by mouth daily. Patient taking differently: Take 25 mg by mouth daily.  02/27/18  Yes Dunn, Areta Haber, PA-C  nitroGLYCERIN (NITROSTAT) 0.4 MG SL tablet Place 1 tablet (0.4 mg total) under the  tongue every 5 (five) minutes x 3 doses as needed for chest pain. 02/15/12  Yes Theora Gianotti, NP  PARoxetine (PAXIL) 40 MG tablet Take 1 tablet (40 mg total) by mouth daily. 07/08/18  Yes Poulose, Bethel Born, NP      PHYSICAL EXAMINATION:   VITAL SIGNS: Blood pressure (!) 156/92, pulse 85, temperature 99 F (37.2 C), temperature source Oral, resp. rate 17, height 5\' 7"  (1.702 m), weight 77.1 kg, SpO2 92 %.  GENERAL:  48 y.o.-year-old patient lying in the bed with no acute distress.  EYES: Pupils equal, round, reactive to light and accommodation. No scleral icterus. Extraocular muscles intact.  HEENT: Head atraumatic, normocephalic. Oropharynx and nasopharynx clear.  NECK:  Supple, no jugular venous distention. No thyroid enlargement, no tenderness.  LUNGS: Normal breath sounds bilaterally, no wheezing, rales,rhonchi or crepitation. No use of accessory muscles of respiration.  CARDIOVASCULAR: S1, S2 normal. No murmurs, rubs, or gallops.  ABDOMEN: Soft, nontender, nondistended. Bowel  sounds present. No organomegaly or mass.  EXTREMITIES: No pedal edema, cyanosis, or clubbing.  NEUROLOGIC: Cranial nerves II through XII are intact. Muscle strength 5/5 in all extremities. Sensation intact. Gait not checked.  PSYCHIATRIC: The patient is alert and oriented x 3.  SKIN: No obvious rash, lesion, or ulcer.   LABORATORY PANEL:   CBC Recent Labs  Lab 11/12/18 1041  WBC 10.6*  HGB 15.9*  HCT 48.9*  PLT 305  MCV 90.1  MCH 29.3  MCHC 32.5  RDW 16.2*   ------------------------------------------------------------------------------------------------------------------  Chemistries  Recent Labs  Lab 11/12/18 1041  NA 143  K 3.4*  CL 106  CO2 20*  GLUCOSE 165*  BUN 21*  CREATININE 1.48*  CALCIUM 9.7  AST 35  ALT 23  ALKPHOS 108  BILITOT 2.0*   ------------------------------------------------------------------------------------------------------------------ estimated  creatinine clearance is 49.2 mL/min (A) (by C-G formula based on SCr of 1.48 mg/dL (H)). ------------------------------------------------------------------------------------------------------------------ No results for input(s): TSH, T4TOTAL, T3FREE, THYROIDAB in the last 72 hours.  Invalid input(s): FREET3   Coagulation profile No results for input(s): INR, PROTIME in the last 168 hours. ------------------------------------------------------------------------------------------------------------------- No results for input(s): DDIMER in the last 72 hours. -------------------------------------------------------------------------------------------------------------------  Cardiac Enzymes No results for input(s): CKMB, TROPONINI, MYOGLOBIN in the last 168 hours.  Invalid input(s): CK ------------------------------------------------------------------------------------------------------------------ Invalid input(s): POCBNP  ---------------------------------------------------------------------------------------------------------------  Urinalysis    Component Value Date/Time   COLORURINE STRAW (A) 11/12/2018 1342   APPEARANCEUR CLEAR (A) 11/12/2018 1342   LABSPEC 1.029 11/12/2018 1342   PHURINE 7.0 11/12/2018 1342   GLUCOSEU 50 (A) 11/12/2018 1342   HGBUR NEGATIVE 11/12/2018 1342   BILIRUBINUR NEGATIVE 11/12/2018 1342   KETONESUR 5 (A) 11/12/2018 1342   PROTEINUR 100 (A) 11/12/2018 1342   UROBILINOGEN 0.2 02/12/2012 1541   NITRITE NEGATIVE 11/12/2018 1342   LEUKOCYTESUR NEGATIVE 11/12/2018 1342     RADIOLOGY: Ct Angio Chest/abd/pel For Dissection W And/or Wo Contrast  Result Date: 11/12/2018 CLINICAL DATA:  Chest and abdominal pain radiating toward back. Nausea and vomiting. EXAM: CT ANGIOGRAPHY CHEST, ABDOMEN AND PELVIS TECHNIQUE: Initially, axial CT images were obtained through the chest and upper abdomen without intravenous contrast material administration. Multidetector CT  imaging through the chest, abdomen and pelvis was performed using the standard protocol during bolus administration of intravenous contrast. Multiplanar reconstructed images and MIPs were obtained and reviewed to evaluate the vascular anatomy. CONTRAST:  21mL OMNIPAQUE IOHEXOL 350 MG/ML SOLN COMPARISON:  CT abdomen and pelvis June 26, 2018 FINDINGS: CTA CHEST FINDINGS Cardiovascular: There is no intramural hematoma seen in the thoracic and upper abdominal aorta on noncontrast enhanced images. There is no demonstrable thoracic aortic aneurysm or dissection. There are foci of aortic atherosclerosis. The visualized great vessels appear unremarkable. The right innominate and left common carotid arteries arise as a common trunk, an anatomic variant. The left vertebral artery arises directly from the aortic arch, an anatomic variant. There is cardiomegaly with a focal pericardial effusion. The pericardium does not appear appreciably thickened. There are foci of coronary artery calcification. There is no demonstrable pulmonary embolus. Mediastinum/Nodes: Visualized portions of the thyroid appear quite diminutive without focal lesion. There is no demonstrable thoracic adenopathy. No esophageal lesions are appreciable. Lungs/Pleura: There are bullae in the upper lobes peripherally. There is no appreciable edema or consolidation. There is atelectatic change in the left base region. There is also atelectasis along the medial aspect of the right middle lobe. No evident pleural effusion. On axial slice 78 series 6, there is a 4 mm nodular opacity  in the right middle lobe. On axial slice 84 series 6, there is a 7 x 5 mm nodular opacity in the lateral segment of the right lower lobe. On axial slice 90 series 6, there is a 5 mm nodular opacity in the superior segment of the right lower lobe. On axial slice 95 series 6, there is a 4 mm nodular opacity abutting the pleura in the lateral segment of the right lower lobe. On axial  slice 818 series 6, there is a 4 mm nodular opacity abutting the pleura in the lateral segment of the left lower lobe. On axial slice 563 series 6, there is a 2 mm nodular opacity in the inferior most aspect of the lingula. On axial slice 149 series 6, there is a 4 mm nodular opacity in the anterior segment of the left lower lobe. There is a 2 mm nodular opacity in the lateral segment of the left lower lobe on axial slice 702 series 6. Musculoskeletal: No blastic or lytic bone lesions. No chest wall lesions evident. Review of the MIP images confirms the above findings. CTA ABDOMEN AND PELVIS FINDINGS VASCULAR Aorta: There is no abdominal aortic aneurysm or dissection. There is atherosclerotic calcification throughout much of the abdominal aorta with areas of mild peripheral thrombus at several sites. Celiac: The celiac artery and its major branches appear widely patent with minimal atherosclerotic calcification at the origin of the celiac artery. No aneurysm or dissection involving these vessels evident. SMA: Superior mesenteric artery arises immediately inferior to the superior mesenteric artery. The superior mesenteric artery and its major branches appear widely patent. No aneurysm or dissection. Renals: There is a single renal artery on each side. There is focal calcification at the origin of the right renal artery with approximately 30% diameter stenosis. There is noncalcified plaque at the origin of the left renal artery with a focal area of short-segment narrowing of age in 90% period no aneurysm or dissection involves the renal arteries and their branches. No fibromuscular dysplasia evident on either side. IMA: Inferior mesenteric artery and its branches appear patent without aneurysm or dissection. Inflow: There is extensive plaque in each common iliac artery. There is also thrombus in the proximal common iliac arteries. There is approximately 50% diameter stenosis in the proximal right common iliac artery  with 60-70% diameter stenosis in the proximal left common iliac artery. More distally in the left common iliac artery, there is approximately 75-80% diameter stenosis. There is moderate calcification at the origin of the left and right internal iliac arteries. There is approximately 50% diameter stenosis at the origin of the left external iliac artery. Milder atherosclerotic irregularity noted in the proximal right external iliac artery. The more distal external iliac arteries are patent. Each common femoral artery is patent. The proximal right superficial and profunda femoral arteries appear patent without aneurysm or dissection involving pelvic arterial vessels. Veins: No obvious venous abnormality within the limitations of this arterial phase study beyond apparent reflux of contrast into the hepatic veins and inferior vena cava. Review of the MIP images confirms the above findings. NON-VASCULAR Hepatobiliary: The liver contour is subtly nodular. There is hepatic steatosis. Liver measures 20.9 cm in length. No focal liver lesions are evident. The gallbladder wall is not appreciably thickened. There is no biliary duct dilatation. Pancreas: There is no appreciable pancreatic mass or inflammatory focus. Spleen: No splenic lesions are evident. Adrenals/Urinary Tract: There is adrenal hypertrophy bilaterally, essentially stable. Kidneys bilaterally show no evident mass or hydronephrosis on either  side. There is no appreciable renal or ureteral calculus on either side. Urinary bladder is midline with wall thickness within normal limits. Stomach/Bowel: There is no appreciable bowel wall or mesenteric thickening. There is no demonstrable bowel obstruction. Terminal ileum appears normal. There is no evident free air or portal venous air. Lymphatic: No evident adenopathy in the abdomen or pelvis. Reproductive: Uterus absent.  No evident pelvic mass. Other: No periappendiceal region inflammatory change. Appendix appears  normal. No abscess or ascites evident in the abdomen or pelvis. Musculoskeletal: There is degenerative change in the lumbar spine. There is vacuum phenomenon at L5-S1. No blastic or lytic bone lesions. There is no intramuscular or abdominal wall lesion evident. Review of the MIP images confirms the above findings. IMPRESSION: CT angiogram chest: 1. No thoracic aortic aneurysm or dissection. There are foci of aortic atherosclerosis as well as foci of coronary artery calcification. 2. Cardiomegaly with pericardial effusion, findings noted previously. 3.  No demonstrable pulmonary embolus. 4. Multiple pulmonary nodular opacities, largest measuring 7 x 5 mm. Non-contrast chest CT at 6-12 months is recommended. If the nodule is stable at time of repeat CT, then future CT at 18-24 months (from today's scan) is considered optional for low-risk patients, but is recommended for high-risk patients. This recommendation follows the consensus statement: Guidelines for Management of Incidental Pulmonary Nodules Detected on CT Images: From the Fleischner Society 2017; Radiology 2017; 284:228-243. No lung edema or consolidation. 5.  No evident thoracic adenopathy. CT angiogram abdomen; CT angiogram pelvis: 1. No aneurysm or dissection involving the aorta, major mesenteric, and major pelvic arterial vessels. 2. Extensive aortic and proximal pelvic arterial atherosclerotic calcification. Areas of hemodynamically significant obstruction noted in the proximal left renal artery as well as in the left common iliac artery. 3. 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 There is also hepatic steatosis. No focal liver lesions appreciable on this study. 4. Reflux of contrast into the inferior vena cava and hepatic veins likely represents increase in right heart pressure. 5. No bowel obstruction. No abscess in the abdomen or pelvis. Appendix region appears normal. 6. No evident renal or ureteral calculus. No hydronephrosis. Urinary bladder wall  thickness normal. 7. Stable adrenal hypertrophy bilaterally. Clinical significance of this finding uncertain. Appropriate laboratory correlation may be advisable, particularly if clinical symptoms should warrant. 8. Uterus absent. Electronically Signed   By: Lowella Grip III M.D.   On: 11/12/2018 13:57    EKG: Orders placed or performed during the hospital encounter of 11/12/18  . ED EKG  . ED EKG  . EKG 12-Lead  . EKG 12-Lead    IMPRESSION AND PLAN: Patient is 48 year old white female with history of coronary artery disease nicotine abuse multiple other medical problems presenting with various symptoms  1.  Chest pain with abnormal troponin I have asked cardiology to see likely due to demand ischemia with elevated blood pressure  2.  Abdominal pain could be related to atherosclerotic disease in her abdomen I have asked vascular to see the patient they may be proceeding with angiogram tomorrow morning  3.  Accelerated hypertension I will place patient on IV hydralazine continue therapy with lisinopril and metoprolol  4.  Hyperlipidemia continue Lipitor  5.  Chronic kidney disease I will hold her Lasix and plan for possible angiogram tomorrow  6.  Depression anxiety continue Paxil  7.  Miscellaneous Lovenox for DVT prophylaxis  8.  Nicotine abuse smoking cessation provided 4 minutes spent strongly recommend patient stop smoking  All the records  are reviewed and case discussed with ED provider. Management plans discussed with the patient, family and they are in agreement.  CODE STATUS: Full code   TOTAL TIME TAKING CARE OF THIS PATIENT: 55 minutes.    Dustin Flock M.D on 11/12/2018 at 3:21 PM  Between 7am to 6pm - Pager - [phone]  After 6pm go to [email or web address] - password [email or web address] [place]  [phone]  CC: Primary care physician; Arnetha Courser, MD

## 2018-11-12 NOTE — ED Notes (Signed)
pajtient in [place].  Continues to voimit and pain unchanged.  Moaning.

## 2018-11-12 NOTE — ED Notes (Signed)
Patient transported to CT 

## 2018-11-13 ENCOUNTER — Encounter
Admission: EM | Disposition: A | Discharge status: Home / Self Care | Payer: Self-pay | Source: Home / Self Care | Attending: Internal Medicine

## 2018-11-13 ENCOUNTER — Encounter: Payer: Self-pay | Admitting: Nurse Practitioner

## 2018-11-13 ENCOUNTER — Other Ambulatory Visit (INDEPENDENT_AMBULATORY_CARE_PROVIDER_SITE_OTHER): Payer: Self-pay | Admitting: Vascular Surgery

## 2018-11-13 DIAGNOSIS — R112 Nausea with vomiting, unspecified: Secondary | ICD-10-CM

## 2018-11-13 DIAGNOSIS — I160 Hypertensive urgency: Secondary | ICD-10-CM | POA: Diagnosis not present

## 2018-11-13 DIAGNOSIS — I2510 Atherosclerotic heart disease of native coronary artery without angina pectoris: Secondary | ICD-10-CM | POA: Diagnosis not present

## 2018-11-13 DIAGNOSIS — I701 Atherosclerosis of renal artery: Secondary | ICD-10-CM | POA: Diagnosis not present

## 2018-11-13 DIAGNOSIS — I10 Essential (primary) hypertension: Secondary | ICD-10-CM | POA: Diagnosis present

## 2018-11-13 DIAGNOSIS — F418 Other specified anxiety disorders: Secondary | ICD-10-CM | POA: Diagnosis not present

## 2018-11-13 DIAGNOSIS — I25118 Atherosclerotic heart disease of native coronary artery with other forms of angina pectoris: Secondary | ICD-10-CM | POA: Diagnosis not present

## 2018-11-13 DIAGNOSIS — E876 Hypokalemia: Secondary | ICD-10-CM | POA: Diagnosis not present

## 2018-11-13 DIAGNOSIS — I255 Ischemic cardiomyopathy: Secondary | ICD-10-CM | POA: Diagnosis not present

## 2018-11-13 DIAGNOSIS — N186 End stage renal disease: Secondary | ICD-10-CM | POA: Diagnosis not present

## 2018-11-13 DIAGNOSIS — E785 Hyperlipidemia, unspecified: Secondary | ICD-10-CM | POA: Diagnosis not present

## 2018-11-13 DIAGNOSIS — Z03818 Encounter for observation for suspected exposure to other biological agents ruled out: Secondary | ICD-10-CM | POA: Diagnosis not present

## 2018-11-13 DIAGNOSIS — I700 Atherosclerosis of aorta: Secondary | ICD-10-CM

## 2018-11-13 DIAGNOSIS — I130 Hypertensive heart and chronic kidney disease with heart failure and stage 1 through stage 4 chronic kidney disease, or unspecified chronic kidney disease: Secondary | ICD-10-CM | POA: Diagnosis not present

## 2018-11-13 DIAGNOSIS — R7989 Other specified abnormal findings of blood chemistry: Secondary | ICD-10-CM | POA: Diagnosis not present

## 2018-11-13 DIAGNOSIS — R079 Chest pain, unspecified: Secondary | ICD-10-CM

## 2018-11-13 DIAGNOSIS — E039 Hypothyroidism, unspecified: Secondary | ICD-10-CM | POA: Diagnosis not present

## 2018-11-13 DIAGNOSIS — J301 Allergic rhinitis due to pollen: Secondary | ICD-10-CM | POA: Diagnosis not present

## 2018-11-13 DIAGNOSIS — I252 Old myocardial infarction: Secondary | ICD-10-CM | POA: Diagnosis not present

## 2018-11-13 DIAGNOSIS — Z8249 Family history of ischemic heart disease and other diseases of the circulatory system: Secondary | ICD-10-CM | POA: Diagnosis not present

## 2018-11-13 DIAGNOSIS — M109 Gout, unspecified: Secondary | ICD-10-CM | POA: Diagnosis not present

## 2018-11-13 DIAGNOSIS — I708 Atherosclerosis of other arteries: Secondary | ICD-10-CM | POA: Diagnosis not present

## 2018-11-13 DIAGNOSIS — R918 Other nonspecific abnormal finding of lung field: Secondary | ICD-10-CM | POA: Diagnosis not present

## 2018-11-13 DIAGNOSIS — Z20828 Contact with and (suspected) exposure to other viral communicable diseases: Secondary | ICD-10-CM | POA: Diagnosis not present

## 2018-11-13 DIAGNOSIS — N184 Chronic kidney disease, stage 4 (severe): Secondary | ICD-10-CM | POA: Diagnosis not present

## 2018-11-13 DIAGNOSIS — F17210 Nicotine dependence, cigarettes, uncomplicated: Secondary | ICD-10-CM | POA: Diagnosis not present

## 2018-11-13 DIAGNOSIS — Z955 Presence of coronary angioplasty implant and graft: Secondary | ICD-10-CM | POA: Diagnosis not present

## 2018-11-13 DIAGNOSIS — R109 Unspecified abdominal pain: Secondary | ICD-10-CM | POA: Diagnosis not present

## 2018-11-13 DIAGNOSIS — E1151 Type 2 diabetes mellitus with diabetic peripheral angiopathy without gangrene: Secondary | ICD-10-CM | POA: Diagnosis not present

## 2018-11-13 DIAGNOSIS — R1013 Epigastric pain: Secondary | ICD-10-CM | POA: Diagnosis not present

## 2018-11-13 DIAGNOSIS — Z833 Family history of diabetes mellitus: Secondary | ICD-10-CM | POA: Diagnosis not present

## 2018-11-13 DIAGNOSIS — I5042 Chronic combined systolic (congestive) and diastolic (congestive) heart failure: Secondary | ICD-10-CM | POA: Diagnosis not present

## 2018-11-13 DIAGNOSIS — K760 Fatty (change of) liver, not elsewhere classified: Secondary | ICD-10-CM | POA: Diagnosis not present

## 2018-11-13 HISTORY — DX: Essential (primary) hypertension: I10

## 2018-11-13 LAB — MAGNESIUM: Magnesium: 2 mg/dL (ref 1.7–2.4)

## 2018-11-13 LAB — LIPID PANEL
Cholesterol: 140 mg/dL (ref 0–200)
HDL: 29 mg/dL — ABNORMAL LOW (ref >40)
LDL Cholesterol: 96 mg/dL (ref 0–99)
Total CHOL/HDL Ratio: 4.8 RATIO
Triglycerides: 74 mg/dL (ref <150)
VLDL: 15 mg/dL (ref 0–40)

## 2018-11-13 LAB — BASIC METABOLIC PANEL
Anion gap: 9 (ref 5–15)
BUN: 22 mg/dL — ABNORMAL HIGH (ref 6–20)
CO2: 26 mmol/L (ref 22–32)
Calcium: 8.6 mg/dL — ABNORMAL LOW (ref 8.9–10.3)
Chloride: 108 mmol/L (ref 98–111)
Creatinine, Ser: 1.41 mg/dL — ABNORMAL HIGH (ref 0.44–1.00)
GFR calc Af Amer: 51 mL/min — ABNORMAL LOW (ref >60)
GFR calc non Af Amer: 44 mL/min — ABNORMAL LOW (ref >60)
Glucose, Bld: 91 mg/dL (ref 70–99)
Potassium: 3.2 mmol/L — ABNORMAL LOW (ref 3.5–5.1)
Sodium: 143 mmol/L (ref 135–145)

## 2018-11-13 LAB — CBC
HCT: 42 % (ref 36.0–46.0)
Hemoglobin: 13.3 g/dL (ref 12.0–15.0)
MCH: 29.1 pg (ref 26.0–34.0)
MCHC: 31.7 g/dL (ref 30.0–36.0)
MCV: 91.9 fL (ref 80.0–100.0)
Platelets: 224 10*3/uL (ref 150–400)
RBC: 4.57 MIL/uL (ref 3.87–5.11)
RDW: 16.4 % — ABNORMAL HIGH (ref 11.5–15.5)
WBC: 6.5 10*3/uL (ref 4.0–10.5)
nRBC: 0 % (ref 0.0–0.2)

## 2018-11-13 LAB — HEMOGLOBIN AND HEMATOCRIT, BLOOD
HCT: 43.8 % (ref 36.0–46.0)
Hemoglobin: 13.8 g/dL (ref 12.0–15.0)

## 2018-11-13 SURGERY — RENAL ANGIOGRAPHY
Anesthesia: Moderate Sedation | Laterality: Bilateral

## 2018-11-13 MED ORDER — HYDRALAZINE HCL 25 MG PO TABS
25.0000 mg | ORAL_TABLET | Freq: Three times a day (TID) | ORAL | Status: DC
  Administered 2018-11-13 – 2018-11-14 (×4): 25 mg via ORAL
  Filled 2018-11-13 (×4): qty 1

## 2018-11-13 MED ORDER — PANTOPRAZOLE SODIUM 40 MG PO TBEC
40.0000 mg | DELAYED_RELEASE_TABLET | Freq: Every day | ORAL | Status: DC
  Administered 2018-11-13 – 2018-11-14 (×2): 40 mg via ORAL
  Filled 2018-11-13 (×2): qty 1

## 2018-11-13 MED ORDER — POTASSIUM CHLORIDE CRYS ER 20 MEQ PO TBCR
40.0000 meq | EXTENDED_RELEASE_TABLET | Freq: Once | ORAL | Status: AC
  Administered 2018-11-13: 40 meq via ORAL
  Filled 2018-11-13: qty 2

## 2018-11-13 MED ORDER — ACETAMINOPHEN 325 MG PO TABS: 650.0000 mg | ORAL_TABLET | Freq: Four times a day (QID) | ORAL | Status: DC | PRN

## 2018-11-13 MED ORDER — ATORVASTATIN CALCIUM 80 MG PO TABS
80.0000 mg | ORAL_TABLET | Freq: Every day | ORAL | Status: DC
  Administered 2018-11-13: 80 mg via ORAL
  Filled 2018-11-13: qty 1

## 2018-11-13 MED ORDER — ACETAMINOPHEN 650 MG RE SUPP: 650.0000 mg | Freq: Four times a day (QID) | RECTAL | Status: DC | PRN

## 2018-11-13 MED ORDER — NICOTINE 21 MG/24HR TD PT24
21.0000 mg | MEDICATED_PATCH | Freq: Every day | TRANSDERMAL | Status: DC
  Administered 2018-11-13 – 2018-11-14 (×2): 21 mg via TRANSDERMAL
  Filled 2018-11-13 (×2): qty 1

## 2018-11-13 MED ORDER — CARVEDILOL 6.25 MG PO TABS
6.2500 mg | ORAL_TABLET | Freq: Two times a day (BID) | ORAL | Status: DC
  Administered 2018-11-13 – 2018-11-14 (×2): 6.25 mg via ORAL
  Filled 2018-11-13 (×2): qty 1

## 2018-11-13 NOTE — Progress Notes (Signed)
[facility] NAME: Angelica Herrera    MR#:  [id number]  DATE OF BIRTH:  28-Jul-1970  SUBJECTIVE:  CHIEF COMPLAINT:   Chief Complaint  Patient presents with  . Pancreatitis  . Abdominal Pain  . Emesis  . Nausea  Patient seen and evaluated today Has mild abdominal pain No chest pain Has nausea but no new episodes of vomiting Patient seen by cardiology but no acute intervention recommended REVIEW OF SYSTEMS:    ROS  CONSTITUTIONAL: No documented fever. No fatigue, weakness. No weight gain, no weight loss.  EYES: No blurry or double vision.  ENT: No tinnitus. No postnasal drip. No redness of the oropharynx.  RESPIRATORY: No cough, no wheeze, no hemoptysis. No dyspnea.  CARDIOVASCULAR: No chest pain. No orthopnea. No palpitations. No syncope.  GASTROINTESTINAL: No nausea, no vomiting or diarrhea. Has abdominal pain. No melena or hematochezia.  GENITOURINARY: No dysuria or hematuria.  ENDOCRINE: No polyuria or nocturia. No heat or cold intolerance.  HEMATOLOGY: No anemia. No bruising. No bleeding.  INTEGUMENTARY: No rashes. No lesions.  MUSCULOSKELETAL: No arthritis. No swelling. No gout.  NEUROLOGIC: No numbness, tingling, or ataxia. No seizure-type activity.  PSYCHIATRIC: No anxiety. No insomnia. No ADD.   DRUG ALLERGIES:  No Known Allergies  VITALS:  Blood pressure (!) 162/106, pulse 65, temperature 98.3 F (36.8 C), temperature source Oral, resp. rate 20, height 5\' 7"  (1.702 m), weight 77.1 kg, SpO2 96 %.  PHYSICAL EXAMINATION:   Physical Exam  GENERAL:  48 y.o.-year-old patient lying in the bed with no acute distress.  EYES: Pupils equal, round, reactive to light and accommodation. No scleral icterus. Extraocular muscles intact.  HEENT: Head atraumatic, normocephalic. Oropharynx and nasopharynx clear.  NECK:  Supple, no jugular venous distention. No thyroid enlargement, no tenderness.  LUNGS: Normal breath sounds  bilaterally, no wheezing, rales, rhonchi. No use of accessory muscles of respiration.  CARDIOVASCULAR: S1, S2 normal. No murmurs, rubs, or gallops.  ABDOMEN: Soft, mild tenderness in epigastrium, nondistended. Bowel sounds present. No organomegaly or mass.  EXTREMITIES: No cyanosis, clubbing or edema b/l.    NEUROLOGIC: Cranial nerves II through XII are intact. No focal Motor or sensory deficits b/l.   PSYCHIATRIC: The patient is alert and oriented x 3.  SKIN: No obvious rash, lesion, or ulcer.   LABORATORY PANEL:   CBC Recent Labs  Lab 11/13/18 0628  WBC 6.5  HGB 13.3  HCT 42.0  PLT 224   ------------------------------------------------------------------------------------------------------------------ Chemistries  Recent Labs  Lab 11/12/18 1041 11/13/18 0628  NA 143 143  K 3.4* 3.2*  CL 106 108  CO2 20* 26  GLUCOSE 165* 91  BUN 21* 22*  CREATININE 1.48* 1.41*  CALCIUM 9.7 8.6*  MG  --  2.0  AST 35  --   ALT 23  --   ALKPHOS 108  --   BILITOT 2.0*  --    ------------------------------------------------------------------------------------------------------------------  Cardiac Enzymes No results for input(s): TROPONINI in the last 168 hours. ------------------------------------------------------------------------------------------------------------------  RADIOLOGY:  Ct Angio Chest/abd/pel For Dissection W And/or Wo Contrast  Result Date: 11/12/2018 CLINICAL DATA:  Chest and abdominal pain radiating toward back. Nausea and vomiting. EXAM: CT ANGIOGRAPHY CHEST, ABDOMEN AND PELVIS TECHNIQUE: Initially, axial CT images were obtained through the chest and upper abdomen without intravenous contrast material administration. Multidetector CT imaging through the chest, abdomen and pelvis was performed using the standard protocol during bolus administration of intravenous contrast. Multiplanar reconstructed images and MIPs were obtained  and reviewed to evaluate the vascular  anatomy. CONTRAST:  31mL OMNIPAQUE IOHEXOL 350 MG/ML SOLN COMPARISON:  CT abdomen and pelvis June 26, 2018 FINDINGS: CTA CHEST FINDINGS Cardiovascular: There is no intramural hematoma seen in the thoracic and upper abdominal aorta on noncontrast enhanced images. There is no demonstrable thoracic aortic aneurysm or dissection. There are foci of aortic atherosclerosis. The visualized great vessels appear unremarkable. The right innominate and left common carotid arteries arise as a common trunk, an anatomic variant. The left vertebral artery arises directly from the aortic arch, an anatomic variant. There is cardiomegaly with a focal pericardial effusion. The pericardium does not appear appreciably thickened. There are foci of coronary artery calcification. There is no demonstrable pulmonary embolus. Mediastinum/Nodes: Visualized portions of the thyroid appear quite diminutive without focal lesion. There is no demonstrable thoracic adenopathy. No esophageal lesions are appreciable. Lungs/Pleura: There are bullae in the upper lobes peripherally. There is no appreciable edema or consolidation. There is atelectatic change in the left base region. There is also atelectasis along the medial aspect of the right middle lobe. No evident pleural effusion. On axial slice 78 series 6, there is a 4 mm nodular opacity in the right middle lobe. On axial slice 84 series 6, there is a 7 x 5 mm nodular opacity in the lateral segment of the right lower lobe. On axial slice 90 series 6, there is a 5 mm nodular opacity in the superior segment of the right lower lobe. On axial slice 95 series 6, there is a 4 mm nodular opacity abutting the pleura in the lateral segment of the right lower lobe. On axial slice 211 series 6, there is a 4 mm nodular opacity abutting the pleura in the lateral segment of the left lower lobe. On axial slice 941 series 6, there is a 2 mm nodular opacity in the inferior most aspect of the lingula. On axial slice  740 series 6, there is a 4 mm nodular opacity in the anterior segment of the left lower lobe. There is a 2 mm nodular opacity in the lateral segment of the left lower lobe on axial slice 814 series 6. Musculoskeletal: No blastic or lytic bone lesions. No chest wall lesions evident. Review of the MIP images confirms the above findings. CTA ABDOMEN AND PELVIS FINDINGS VASCULAR Aorta: There is no abdominal aortic aneurysm or dissection. There is atherosclerotic calcification throughout much of the abdominal aorta with areas of mild peripheral thrombus at several sites. Celiac: The celiac artery and its major branches appear widely patent with minimal atherosclerotic calcification at the origin of the celiac artery. No aneurysm or dissection involving these vessels evident. SMA: Superior mesenteric artery arises immediately inferior to the superior mesenteric artery. The superior mesenteric artery and its major branches appear widely patent. No aneurysm or dissection. Renals: There is a single renal artery on each side. There is focal calcification at the origin of the right renal artery with approximately 30% diameter stenosis. There is noncalcified plaque at the origin of the left renal artery with a focal area of short-segment narrowing of age in 90% period no aneurysm or dissection involves the renal arteries and their branches. No fibromuscular dysplasia evident on either side. IMA: Inferior mesenteric artery and its branches appear patent without aneurysm or dissection. Inflow: There is extensive plaque in each common iliac artery. There is also thrombus in the proximal common iliac arteries. There is approximately 50% diameter stenosis in the proximal right common iliac artery with 60-70% diameter  stenosis in the proximal left common iliac artery. More distally in the left common iliac artery, there is approximately 75-80% diameter stenosis. There is moderate calcification at the origin of the left and right  internal iliac arteries. There is approximately 50% diameter stenosis at the origin of the left external iliac artery. Milder atherosclerotic irregularity noted in the proximal right external iliac artery. The more distal external iliac arteries are patent. Each common femoral artery is patent. The proximal right superficial and profunda femoral arteries appear patent without aneurysm or dissection involving pelvic arterial vessels. Veins: No obvious venous abnormality within the limitations of this arterial phase study beyond apparent reflux of contrast into the hepatic veins and inferior vena cava. Review of the MIP images confirms the above findings. NON-VASCULAR Hepatobiliary: The liver contour is subtly nodular. There is hepatic steatosis. Liver measures 20.9 cm in length. No focal liver lesions are evident. The gallbladder wall is not appreciably thickened. There is no biliary duct dilatation. Pancreas: There is no appreciable pancreatic mass or inflammatory focus. Spleen: No splenic lesions are evident. Adrenals/Urinary Tract: There is adrenal hypertrophy bilaterally, essentially stable. Kidneys bilaterally show no evident mass or hydronephrosis on either side. There is no appreciable renal or ureteral calculus on either side. Urinary bladder is midline with wall thickness within normal limits. Stomach/Bowel: There is no appreciable bowel wall or mesenteric thickening. There is no demonstrable bowel obstruction. Terminal ileum appears normal. There is no evident free air or portal venous air. Lymphatic: No evident adenopathy in the abdomen or pelvis. Reproductive: Uterus absent.  No evident pelvic mass. Other: No periappendiceal region inflammatory change. Appendix appears normal. No abscess or ascites evident in the abdomen or pelvis. Musculoskeletal: There is degenerative change in the lumbar spine. There is vacuum phenomenon at L5-S1. No blastic or lytic bone lesions. There is no intramuscular or abdominal  wall lesion evident. Review of the MIP images confirms the above findings. IMPRESSION: CT angiogram chest: 1. No thoracic aortic aneurysm or dissection. There are foci of aortic atherosclerosis as well as foci of coronary artery calcification. 2. Cardiomegaly with pericardial effusion, findings noted previously. 3.  No demonstrable pulmonary embolus. 4. Multiple pulmonary nodular opacities, largest measuring 7 x 5 mm. Non-contrast chest CT at 6-12 months is recommended. If the nodule is stable at time of repeat CT, then future CT at 18-24 months (from today's scan) is considered optional for low-risk patients, but is recommended for high-risk patients. This recommendation follows the consensus statement: Guidelines for Management of Incidental Pulmonary Nodules Detected on CT Images: From the Fleischner Society 2017; Radiology 2017; 284:228-243. No lung edema or consolidation. 5.  No evident thoracic adenopathy. CT angiogram abdomen; CT angiogram pelvis: 1. No aneurysm or dissection involving the aorta, major mesenteric, and major pelvic arterial vessels. 2. Extensive aortic and proximal pelvic arterial atherosclerotic calcification. Areas of hemodynamically significant obstruction noted in the proximal left renal artery as well as in the left common iliac artery. 3. 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 There is also hepatic steatosis. No focal liver lesions appreciable on this study. 4. Reflux of contrast into the inferior vena cava and hepatic veins likely represents increase in right heart pressure. 5. No bowel obstruction. No abscess in the abdomen or pelvis. Appendix region appears normal. 6. No evident renal or ureteral calculus. No hydronephrosis. Urinary bladder wall thickness normal. 7. Stable adrenal hypertrophy bilaterally. Clinical significance of this finding uncertain. Appropriate laboratory correlation may be advisable, particularly if clinical symptoms should warrant. 8. Uterus  absent.  Electronically Signed   By: Lowella Grip III M.D.   On: 11/12/2018 13:57     ASSESSMENT AND PLAN:   48 year old female patient with history of coronary disease, chronic congestive heart failure, CKD stage II, hypertension, hyperlipidemia, hypothyroidism, tobacco abuse currently under hospitalist service.  -Renal artery stenosis CT angiogram reviewed Patient has been noncompliant with her meds Renal angiogram could not be done today because the machine was not working Will be able to get the test done on Tuesday Appreciate vascular surgery evaluation  -Abdominal pain Unlikely pancreatitis and mesenteric ischemia Mesenteric arteries have been patent on his recent CTA  -Peripheral arterial disease Noted on the CT of the pelvis Probably the cause of intermittent claudication in bilateral calves on ambulation Outpatient further work-up and follow-up No plan for any lower extremity leg intervention currently Continue aspirin, Plavix and statin medication  -Tobacco abuse Tobacco cessation counseled to the patient for 6 minutes Nicotine patch offered  -Accelerated hypertension Continue beta-blocker Add oral hydralazine  -Drop in hemoglobin and hematocrit might be secondary to hemodilution We will monitor hemoglobin hematocrit  All the records are reviewed and case discussed with Care Management/Social Worker. Management plans discussed with the patient, family and they are in agreement.  CODE STATUS: Full code  DVT Prophylaxis: SCDs  TOTAL TIME TAKING CARE OF THIS PATIENT: 38 minutes.   POSSIBLE D/C IN 1 to 2 DAYS, DEPENDING ON CLINICAL CONDITION.  Saundra Shelling M.D on 11/13/2018 at 1:46 PM  Between 7am to 6pm - Pager - [phone]  After 6pm go to [email or web address] - password [email or web address] [facility]  Office  [phone]  CC: Primary care physician; Arnetha Courser, MD  Note: This dictation was prepared with Dragon dictation along with smaller  phrase technology. Any transcriptional errors that result from this process are unintentional.

## 2018-11-13 NOTE — Consult Note (Signed)
[facility] Consult Note  MRN : [id number]  Angelica Herrera is a 48 y.o. (21-Oct-1970) female who presents with chief complaint of  Chief Complaint  Patient presents with  . Pancreatitis  . Abdominal Pain  . Emesis  . Nausea   History of Present Illness:  The patient is a 48 year old female with a past medical history of congestive heart failure, history of acute anterior wall MI, uncontrolled hypertension, hyperlipidemia, active tobacco abuse, obesity, chronic kidney disease, underlies anxiety disorder, degenerative disc disease in the cervical spine who presented to the [facility] emergency department with a chief complaint of abdominal pain.  Patient endorses abdominal pain with nausea and vomiting starting 2 days ago.  Patient notes a history of pancreatitis in the past.  Patient states vomit "looked dark".  Denies any fever.  Denies any changes in her bowel habits.  Patient does not she recently stopped taking her antihypertensive medications due to her "running out.  Patient does experience intermittent calf claudication when ambulating long distances. When she arrived in the emergency room blood pressure was 241/141.   CTA A/P 11/12/18: Aorta: There is no abdominal aortic aneurysm or dissection. There is atherosclerotic calcification throughout much of the abdominal aorta with areas of mild peripheral thrombus at several sites.  Celiac: The celiac artery and its major branches appear widely patent with minimal atherosclerotic calcification at the origin of the celiac artery. No aneurysm or dissection involving these vessels evident.  SMA: Superior mesenteric artery arises immediately inferior to the superior mesenteric artery. The superior mesenteric artery and its major branches appear widely patent. No aneurysm or dissection.  Renals: There is a single renal artery on each side. There is focal calcification at the origin  of the right renal artery with approximately 30% diameter stenosis. There is noncalcified plaque at the origin of the left renal artery with a focal area of short-segment narrowing of age in 90% period no aneurysm or dissection involves the renal arteries and their branches. No fibromuscular dysplasia evident on either side.  IMA: Inferior mesenteric artery and its branches appear patent without aneurysm or dissection.  Inflow: There is extensive plaque in each common iliac artery. There is also thrombus in the proximal common iliac arteries. There is approximately 50% diameter stenosis in the proximal right common iliac artery with 60-70% diameter stenosis in the proximal left common iliac artery. More distally in the left common iliac artery, there is approximately 75-80% diameter stenosis. There is moderate calcification at the origin of the left and right internal iliac arteries. There is approximately 50% diameter stenosis at the origin of the left external iliac artery. Milder atherosclerotic irregularity noted in the proximal right external iliac artery. The more distal external iliac arteries are patent. Each common femoral artery is patent. The proximal right superficial and profunda femoral arteries appear patent without aneurysm or dissection involving pelvic arterial vessels.  Veins: No obvious venous abnormality within the limitations of this arterial phase study beyond apparent reflux of contrast into the hepatic veins and inferior vena cava.  Review of the MIP images confirms the above findings.  Vascular Surgery was consulted by Dr. Estanislado Pandy for further recommendations Current Facility-Administered Medications  Medication Dose Route Frequency Provider Last Rate Last Dose  . 0.9 %  sodium chloride infusion  250 mL Intravenous PRN Dustin Flock, MD      . 0.9 %  sodium chloride infusion   Intravenous Continuous Robina Hamor, Janalyn Harder, PA-C      .  acetaminophen (TYLENOL) tablet  650 mg  650 mg Oral Q6H PRN Pyreddy, Reatha Harps, MD       Or  . acetaminophen (TYLENOL) suppository 650 mg  650 mg Rectal Q6H PRN Pyreddy, Reatha Harps, MD      . aspirin EC tablet 81 mg  81 mg Oral Daily Dustin Flock, MD   Stopped at 11/13/18 1045  . atorvastatin (LIPITOR) tablet 40 mg  40 mg Oral q1800 Dustin Flock, MD   40 mg at 11/12/18 1801  . clopidogrel (PLAVIX) tablet 75 mg  75 mg Oral Daily Dustin Flock, MD   Stopped at 11/13/18 1045  . enoxaparin (LOVENOX) injection 40 mg  40 mg Subcutaneous Q24H Dustin Flock, MD   40 mg at 11/12/18 2044  . gabapentin (NEURONTIN) capsule 300 mg  300 mg Oral BID Dustin Flock, MD   300 mg at 11/12/18 2043  . hydrALAZINE (APRESOLINE) injection 10 mg  10 mg Intravenous Q6H PRN Dustin Flock, MD      . levothyroxine (SYNTHROID) tablet 112 mcg  112 mcg Oral Q0600 Dustin Flock, MD      . lisinopril (ZESTRIL) tablet 10 mg  10 mg Oral Daily Dustin Flock, MD   10 mg at 11/12/18 1800  . metoprolol succinate (TOPROL-XL) 24 hr tablet 12.5 mg  12.5 mg Oral Daily Dustin Flock, MD   12.5 mg at 11/12/18 1801  . morphine 2 MG/ML injection 2 mg  2 mg Intravenous Q4H PRN Dustin Flock, MD   2 mg at 11/13/18 1025  . nitroGLYCERIN (NITROSTAT) SL tablet 0.4 mg  0.4 mg Sublingual Q5 Min x 3 PRN Dustin Flock, MD      . ondansetron [facility]) tablet 4 mg  4 mg Oral Q6H PRN Dustin Flock, MD       Or  . ondansetron (ZOFRAN) injection 4 mg  4 mg Intravenous Q6H PRN Dustin Flock, MD      . PARoxetine (PAXIL) tablet 40 mg  40 mg Oral Daily Dustin Flock, MD   40 mg at 11/12/18 2043  . potassium chloride SA (K-DUR) CR tablet 40 mEq  40 mEq Oral Once Oswald Hillock, RPH      . promethazine (PHENERGAN) injection 25 mg  25 mg Intravenous Q6H PRN Merlyn Lot, MD   25 mg at 11/13/18 1025  . sodium chloride flush (NS) 0.9 % injection 3 mL  3 mL Intravenous Q12H Dustin Flock, MD   3 mL at 11/12/18 2047  . sodium chloride flush (NS) 0.9 % injection 3 mL  3  mL Intravenous PRN Dustin Flock, MD       Past Medical History:  Diagnosis Date  . Arthritis    gout  . CAD (coronary artery disease)    a. 02/2012 s/p anterolateral STEMI/Cath/PCI: LM 100 (3.0x15 DES ), LAD sm, severely diseased (PTCA w/ 1.12mm balloon), LCX nl, RI moderate size, nl, RCA 30-102m, AM/PDA/PLA nl, R->L collats noted; b. cath 08/03/15: widely patent ostLM stent at 10%, mLAD 60%,  mRCA 30%, EF 35-40%, ant wall HK, mildly elevated LVEDP; c. 02/2017 MV: ant infarct, mild peri-infarct isch (similar to 2017 study)->Med Rx.  . Chronic combined systolic (congestive) and diastolic (congestive) heart failure ([place])    a. 02/2017 Echo: EF 30-35%, diff HK, sev HK/poss AK of ant and lat walls. Gr1 DD. Mild to mod MR. Nl RV fxn. Mod TR.  . CKD (chronic kidney disease), stage IV ([place])   . HTN (hypertension)   . Hyperlipidemia   . Hypothyroidism   .  Ischemic cardiomyopathy    a. 02/2012 Echo: EF 40-45%, ant/lat HK, Gr1 DD, Mild MR, PASP 71mmHg; b. 02/2017 Echo: EF 30-35%.  . NSVT (nonsustained ventricular tachycardia) ([place])    a. 02/2012 post-MI  . Obesity   . Pancreatitis   . S/P primary angioplasty with coronary stent 03/04/2018   Dual antiplatelet therapy INDEFINITELY per cardiology note Nov 2019  . Tobacco abuse    Past Surgical History:  Procedure Laterality Date  . ABDOMINAL HYSTERECTOMY    . CARDIAC CATHETERIZATION  2013   [facility] s/p stent  . CARDIAC CATHETERIZATION Left 08/03/2015   Procedure: Left Heart Cath and Coronary Angiography;  Surgeon: Wellington Hampshire, MD;  Location: [place] [facility];  Service: Cardiovascular;  Laterality: Left;  . DILATION AND CURETTAGE OF UTERUS    . LEFT HEART CATHETERIZATION WITH CORONARY ANGIOGRAM N/A 02/11/2012   Procedure: LEFT HEART CATHETERIZATION WITH CORONARY ANGIOGRAM;  Surgeon: [name], MD;  Location: [facility] CATH LAB;  Service: Cardiovascular;  Laterality: N/A;   Social History Social History   Tobacco Use  . Smoking status:  Current Every Day Smoker    Packs/day: 0.25    Years: 30.00    Pack years: 7.50    Types: Cigarettes  . Smokeless tobacco: Never Used  Substance Use Topics  . Alcohol use: Not Currently    Comment: Says she previously drank socially - never a heavy drinker.  No etoh since 08/2018.  Marland Kitchen Drug use: No    Types: Marijuana   Family History Family History  Problem Relation Age of Onset  . Hypertension Father   . Thyroid disease Father   . AAA (abdominal aortic aneurysm) Father   . Hypertension Mother   . Hyperlipidemia Mother   . Diabetes Mother   . Kidney disease Mother   . Hypertension Sister   . Hypertension Sister   . Supraventricular tachycardia Sister   . Hypertension Brother   . Hypertension Brother   . Hypertension Daughter   . Cancer Paternal Grandfather        lung  Denies family history of peripheral artery disease, venous disease or bleeding/clotting disorders.  No Known Allergies  REVIEW OF SYSTEMS (Negative unless checked)  Constitutional: [] Weight loss  [] Fever  [] Chills Cardiac: [] Chest pain   [] Chest pressure   [] Palpitations   [] Shortness of breath when laying flat   [] Shortness of breath at rest   [] Shortness of breath with exertion. Vascular:  [x] Pain in legs with walking   [] Pain in legs at rest   [] Pain in legs when laying flat   [x] Claudication   [] Pain in feet when walking  [] Pain in feet at rest  [] Pain in feet when laying flat   [] History of DVT   [] Phlebitis   [] Swelling in legs   [] Varicose veins   [] Non-healing ulcers Pulmonary:   [] Uses home oxygen   [] Productive cough   [] Hemoptysis   [] Wheeze  [] COPD   [] Asthma Neurologic:  [] Dizziness  [] Blackouts   [] Seizures   [] History of stroke   [] History of TIA  [] Aphasia   [] Temporary blindness   [] Dysphagia   [] Weakness or numbness in arms   [] Weakness or numbness in legs Musculoskeletal:  [] Arthritis   [] Joint swelling   [] Joint pain   [] Low back pain Hematologic:  [] Easy bruising  [] Easy bleeding    [] Hypercoagulable state   [] Anemic  [] Hepatitis Gastrointestinal:  [] Blood in stool   [] Vomiting blood  [] Gastroesophageal reflux/heartburn   [] Difficulty swallowing. Genitourinary:  [x] Chronic kidney disease   []   Difficult urination  [] Frequent urination  [] Burning with urination   [] Blood in urine Skin:  [] Rashes   [] Ulcers   [] Wounds Psychological:  [] History of anxiety   []  History of major depression.  Physical Examination  Vitals:   11/12/18 2015 11/13/18 0412 11/13/18 0603 11/13/18 0827  BP: (!) 137/101 (!) 167/109 (!) 142/100 (!) 149/108  Pulse: 77 76 67 64  Resp: 17 20  17   Temp: 98.5 F (36.9 C) 98.4 F (36.9 C)  98.1 F (36.7 C)  TempSrc: Oral Oral  Oral  SpO2: 93% 93%  97%  Weight:      Height:       Body mass index is 26.63 kg/m. Gen:  WD/WN, NAD Head: [place]/AT, No temporalis wasting. Prominent temp pulse not noted. Ear/Nose/Throat: Hearing grossly intact, nares w/o erythema or drainage, oropharynx w/o Erythema/Exudate Eyes: Sclera non-icteric, conjunctiva clear Neck: Trachea midline.  No JVD.  Pulmonary:  Good air movement, respirations not labored, equal bilaterally.  Cardiac: RRR, normal S1, S2. Vascular:  Vessel Right Left  Radial Palpable Palpable  Ulnar Palpable Palpable  Brachial Palpable Palpable  Carotid Palpable, without bruit Palpable, without bruit  Aorta Not palpable N/A  Femoral Palpable Palpable  Popliteal Palpable Palpable  PT Non-Palpable Non-Palpable  DP Non-Palpable Non-Palpable   Gastrointestinal: soft, mildly tender/non-distended. No guarding/reflex.  Musculoskeletal: M/S 5/5 throughout.  Extremities without ischemic changes.  No deformity or atrophy. No edema. Neurologic: Sensation grossly intact in extremities.  Symmetrical.  Speech is fluent. Motor exam as listed above. Psychiatric: Judgment intact, Mood & affect appropriate for pt's clinical situation. Dermatologic: No rashes or ulcers noted.  No cellulitis or open wounds. Lymph : No  Cervical, Axillary, or Inguinal lymphadenopathy.  CBC Lab Results  Component Value Date   WBC 6.5 11/13/2018   HGB 13.3 11/13/2018   HCT 42.0 11/13/2018   MCV 91.9 11/13/2018   PLT 224 11/13/2018   BMET    Component Value Date/Time   NA 143 11/13/2018 0628   NA 140 02/27/2018 1207   NA 135 08/11/2014 1225   K 3.2 (L) 11/13/2018 0628   K 3.7 08/11/2014 1225   CL 108 11/13/2018 0628   CL 110 08/11/2014 1225   CO2 26 11/13/2018 0628   CO2 19 (L) 08/11/2014 1225   GLUCOSE 91 11/13/2018 0628   GLUCOSE 110 (H) 08/11/2014 1225   BUN 22 (H) 11/13/2018 0628   BUN 23 02/27/2018 1207   BUN 17 08/11/2014 1225   CREATININE 1.41 (H) 11/13/2018 0628   CREATININE 1.38 (H) 08/20/2017 1503   CALCIUM 8.6 (L) 11/13/2018 0628   CALCIUM 8.6 (L) 08/11/2014 1225   GFRNONAA 44 (L) 11/13/2018 0628   GFRNONAA 45 (L) 08/20/2017 1503   GFRAA 51 (L) 11/13/2018 0628   GFRAA 52 (L) 08/20/2017 1503   Estimated Creatinine Clearance: 51.7 mL/min (A) (by C-G formula based on SCr of 1.41 mg/dL (H)).  COAG Lab Results  Component Value Date   INR 1.0 07/25/2018   INR 1.06 08/01/2015   INR 1.1 10/17/2013    Radiology Ct Angio Chest/abd/pel For Dissection W And/or Wo Contrast  Result Date: 11/12/2018 CLINICAL DATA:  Chest and abdominal pain radiating toward back. Nausea and vomiting. EXAM: CT ANGIOGRAPHY CHEST, ABDOMEN AND PELVIS TECHNIQUE: Initially, axial CT images were obtained through the chest and upper abdomen without intravenous contrast material administration. Multidetector CT imaging through the chest, abdomen and pelvis was performed using the standard protocol during bolus administration of intravenous contrast. Multiplanar reconstructed images and  MIPs were obtained and reviewed to evaluate the vascular anatomy. CONTRAST:  13mL OMNIPAQUE IOHEXOL 350 MG/ML SOLN COMPARISON:  CT abdomen and pelvis June 26, 2018 FINDINGS: CTA CHEST FINDINGS Cardiovascular: There is no intramural hematoma seen in  the thoracic and upper abdominal aorta on noncontrast enhanced images. There is no demonstrable thoracic aortic aneurysm or dissection. There are foci of aortic atherosclerosis. The visualized great vessels appear unremarkable. The right innominate and left common carotid arteries arise as a common trunk, an anatomic variant. The left vertebral artery arises directly from the aortic arch, an anatomic variant. There is cardiomegaly with a focal pericardial effusion. The pericardium does not appear appreciably thickened. There are foci of coronary artery calcification. There is no demonstrable pulmonary embolus. Mediastinum/Nodes: Visualized portions of the thyroid appear quite diminutive without focal lesion. There is no demonstrable thoracic adenopathy. No esophageal lesions are appreciable. Lungs/Pleura: There are bullae in the upper lobes peripherally. There is no appreciable edema or consolidation. There is atelectatic change in the left base region. There is also atelectasis along the medial aspect of the right middle lobe. No evident pleural effusion. On axial slice 78 series 6, there is a 4 mm nodular opacity in the right middle lobe. On axial slice 84 series 6, there is a 7 x 5 mm nodular opacity in the lateral segment of the right lower lobe. On axial slice 90 series 6, there is a 5 mm nodular opacity in the superior segment of the right lower lobe. On axial slice 95 series 6, there is a 4 mm nodular opacity abutting the pleura in the lateral segment of the right lower lobe. On axial slice 102 series 6, there is a 4 mm nodular opacity abutting the pleura in the lateral segment of the left lower lobe. On axial slice 725 series 6, there is a 2 mm nodular opacity in the inferior most aspect of the lingula. On axial slice 366 series 6, there is a 4 mm nodular opacity in the anterior segment of the left lower lobe. There is a 2 mm nodular opacity in the lateral segment of the left lower lobe on axial slice 440  series 6. Musculoskeletal: No blastic or lytic bone lesions. No chest wall lesions evident. Review of the MIP images confirms the above findings. CTA ABDOMEN AND PELVIS FINDINGS VASCULAR Aorta: There is no abdominal aortic aneurysm or dissection. There is atherosclerotic calcification throughout much of the abdominal aorta with areas of mild peripheral thrombus at several sites. Celiac: The celiac artery and its major branches appear widely patent with minimal atherosclerotic calcification at the origin of the celiac artery. No aneurysm or dissection involving these vessels evident. SMA: Superior mesenteric artery arises immediately inferior to the superior mesenteric artery. The superior mesenteric artery and its major branches appear widely patent. No aneurysm or dissection. Renals: There is a single renal artery on each side. There is focal calcification at the origin of the right renal artery with approximately 30% diameter stenosis. There is noncalcified plaque at the origin of the left renal artery with a focal area of short-segment narrowing of age in 90% period no aneurysm or dissection involves the renal arteries and their branches. No fibromuscular dysplasia evident on either side. IMA: Inferior mesenteric artery and its branches appear patent without aneurysm or dissection. Inflow: There is extensive plaque in each common iliac artery. There is also thrombus in the proximal common iliac arteries. There is approximately 50% diameter stenosis in the proximal right common iliac artery  with 60-70% diameter stenosis in the proximal left common iliac artery. More distally in the left common iliac artery, there is approximately 75-80% diameter stenosis. There is moderate calcification at the origin of the left and right internal iliac arteries. There is approximately 50% diameter stenosis at the origin of the left external iliac artery. Milder atherosclerotic irregularity noted in the proximal right external  iliac artery. The more distal external iliac arteries are patent. Each common femoral artery is patent. The proximal right superficial and profunda femoral arteries appear patent without aneurysm or dissection involving pelvic arterial vessels. Veins: No obvious venous abnormality within the limitations of this arterial phase study beyond apparent reflux of contrast into the hepatic veins and inferior vena cava. Review of the MIP images confirms the above findings. NON-VASCULAR Hepatobiliary: The liver contour is subtly nodular. There is hepatic steatosis. Liver measures 20.9 cm in length. No focal liver lesions are evident. The gallbladder wall is not appreciably thickened. There is no biliary duct dilatation. Pancreas: There is no appreciable pancreatic mass or inflammatory focus. Spleen: No splenic lesions are evident. Adrenals/Urinary Tract: There is adrenal hypertrophy bilaterally, essentially stable. Kidneys bilaterally show no evident mass or hydronephrosis on either side. There is no appreciable renal or ureteral calculus on either side. Urinary bladder is midline with wall thickness within normal limits. Stomach/Bowel: There is no appreciable bowel wall or mesenteric thickening. There is no demonstrable bowel obstruction. Terminal ileum appears normal. There is no evident free air or portal venous air. Lymphatic: No evident adenopathy in the abdomen or pelvis. Reproductive: Uterus absent.  No evident pelvic mass. Other: No periappendiceal region inflammatory change. Appendix appears normal. No abscess or ascites evident in the abdomen or pelvis. Musculoskeletal: There is degenerative change in the lumbar spine. There is vacuum phenomenon at L5-S1. No blastic or lytic bone lesions. There is no intramuscular or abdominal wall lesion evident. Review of the MIP images confirms the above findings. IMPRESSION: CT angiogram chest: 1. No thoracic aortic aneurysm or dissection. There are foci of aortic  atherosclerosis as well as foci of coronary artery calcification. 2. Cardiomegaly with pericardial effusion, findings noted previously. 3.  No demonstrable pulmonary embolus. 4. Multiple pulmonary nodular opacities, largest measuring 7 x 5 mm. Non-contrast chest CT at 6-12 months is recommended. If the nodule is stable at time of repeat CT, then future CT at 18-24 months (from today's scan) is considered optional for low-risk patients, but is recommended for high-risk patients. This recommendation follows the consensus statement: Guidelines for Management of Incidental Pulmonary Nodules Detected on CT Images: From the Fleischner Society 2017; Radiology 2017; 284:228-243. No lung edema or consolidation. 5.  No evident thoracic adenopathy. CT angiogram abdomen; CT angiogram pelvis: 1. No aneurysm or dissection involving the aorta, major mesenteric, and major pelvic arterial vessels. 2. Extensive aortic and proximal pelvic arterial atherosclerotic calcification. Areas of hemodynamically significant obstruction noted in the proximal left renal artery as well as in the left common iliac artery. 3. 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 There is also hepatic steatosis. No focal liver lesions appreciable on this study. 4. Reflux of contrast into the inferior vena cava and hepatic veins likely represents increase in right heart pressure. 5. No bowel obstruction. No abscess in the abdomen or pelvis. Appendix region appears normal. 6. No evident renal or ureteral calculus. No hydronephrosis. Urinary bladder wall thickness normal. 7. Stable adrenal hypertrophy bilaterally. Clinical significance of this finding uncertain. Appropriate laboratory correlation may be advisable, particularly if clinical symptoms should  warrant. 8. Uterus absent. Electronically Signed   By: Lowella Grip III M.D.   On: 11/12/2018 13:57   Assessment/Plan The patient is a 48 year old female with a past medical history of congestive heart  failure, history of acute anterior wall MI, uncontrolled hypertension, hyperlipidemia, active tobacco abuse, obesity, chronic kidney disease, underlies anxiety disorder, degenerative disc disease in the cervical spine who presented to the [facility] emergency department with a chief complaint of abdominal pain. 1. Renal Artery Stenosis: Patient with renal artery stenosis seen on CTA.  Patient presented with hypertensive urgency.  Patient does have a history of being noncompliant with taking her medications however renal artery stenosis can contribute to a harder to control antihypertensive regimen.  Recommend a renal angiogram to assess the patient's anatomy and degree of atherosclerotic disease.  If appropriate an attempt to revascularize can be made.  Procedure, risks and benefits explained to the patient.  All questions answered.  Patient wishes to proceed. 2.  Abdominal pain: Patient with atherosclerotic disease to multiple vessels however her mesenteric arteries on her most recent CTA are patent.  Do not feel that her abdominal pain is from any mesenteric ischemia.  Will possibly consult gastroenterology for EGD 3.  Peripheral artery disease: Peripheral artery disease also noted on CTA of the pelvis especially in the iliacs.  Patient does note intermittent claudication to the bilateral calves with ambulation.  No plan for lower leg intervention at this time/inpatient stay.  Patient is to follow-up with [place] as an outpatient and undergo further work-up. 4.  Tobacco abuse: We had a discussion for approximately three minutes regarding the absolute need for smoking cessation due to the deleterious nature of tobacco on the vascular system. We discussed the tobacco use would diminish patency of any intervention, and likely significantly worsen progressio of disease. We discussed multiple agents for quitting including replacement therapy or medications to reduce cravings such as Chantix. The  patient voices their understanding of the importance of smoking cessation.  Discussed with Dr. Francene Castle, PA-C  11/13/2018 11:59 AM  This note was created with Dragon medical transcription system.  Any error is purely unintentional

## 2018-11-13 NOTE — Progress Notes (Signed)
Initial Nutrition Assessment  DOCUMENTATION CODES:   Not applicable  INTERVENTION:   RD will order supplements once diet advanced  NUTRITION DIAGNOSIS:   Inadequate oral intake related to acute illness as evidenced by per patient/family report.  GOAL:   Patient will meet greater than or equal to 90% of their needs  MONITOR:   PO intake, Supplement acceptance, Labs, Weight trends, Skin, I & O's  REASON FOR ASSESSMENT:   Malnutrition Screening Tool    ASSESSMENT:   48 y.o. female with a known history of multiple medical problems including coronary artery disease, congestive heart failure, chronic kidney disease, essential hypertension, hyperlipidemia, hypothyroidism, tobacco abuse who is presenting to the hospital with complaining of having abdominal pain as well as mid lower sternal chest pain.  RD working remotely.  Pt with poor appetite and oral intake for 1 day pta r/t nausea, vomiting and abdominal pain. Pt reports a h/o pancreatitis. Pt is currently NPO. RD will add supplements once diet advanced. Per chart, pt appears fairly weight stable pta.   Medications reviewed and include: aspirin, plavix, lovenox, synthroid, KCl, NaCl @75ml /hr  Labs reviewed: K 3.2(L), BUN 22(L), creat 1.41(H)  Unable to complete Nutrition-Focused physical exam at this time as pt on contact precautions.   Diet Order:   Diet Order            Diet NPO time specified Except for: Sips with Meds  Diet effective midnight             EDUCATION NEEDS:   No education needs have been identified at this time  Skin:  Skin Assessment: Reviewed RN Assessment  Last BM:  7/31- type 7  Height:   Ht Readings from Last 1 Encounters:  11/12/18 5\' 7"  (1.702 m)    Weight:   Wt Readings from Last 1 Encounters:  11/12/18 77.1 kg    Ideal Body Weight:  61.3 kg  BMI:  Body mass index is 26.63 kg/m.  Estimated Nutritional Needs:   Kcal:  1700-2000kcal/day  Protein:   85-100g/day  Fluid:  >1.9L/day  Koleen Distance MS, RD, LDN Pager #- [phone] Office#- [phone] After Hours Pager: [phone]

## 2018-11-13 NOTE — Consult Note (Signed)
Cardiology Consult    Patient ID: [name] MRN: [id number], DOB/AGE: 49-Jul-1972   Admit date: 11/12/2018 Date of Consult: 11/13/2018  Primary Physician: Arnetha Courser, MD Primary Cardiologist: Kathlyn Sacramento, MD Requesting Provider: Neta Mends, MD  Patient Profile    Angelica Herrera is a 48 y.o. female with a history of CAD s/p LM stenting and PTCA of the LAD (2013), ICM, chronic combined syst/diast CHF, HTN, HL, and tob abuse, who is being seen today for the evaluation of HTN urgency and mild hsTroponin elevation at the request of Dr. Estanislado Pandy.  Past Medical History   Past Medical History:  Diagnosis Date   Arthritis    gout   CAD (coronary artery disease)    a. 02/2012 s/p anterolateral STEMI/Cath/PCI: LM 100 (3.0x15 DES ), LAD sm, severely diseased (PTCA w/ 1.91mm balloon), LCX nl, RI moderate size, nl, RCA 30-30m, AM/PDA/PLA nl, R->L collats noted; b. cath 08/03/15: widely patent ostLM stent at 10%, mLAD 60%,  mRCA 30%, EF 35-40%, ant wall HK, mildly elevated LVEDP; c. 02/2017 MV: ant infarct, mild peri-infarct isch (similar to 2017 study)->Med Rx.   Chronic combined systolic (congestive) and diastolic (congestive) heart failure ([place])    a. 02/2017 Echo: EF 30-35%, diff HK, sev HK/poss AK of ant and lat walls. Gr1 DD. Mild to mod MR. Nl RV fxn. Mod TR.   CKD (chronic kidney disease), stage IV (H[facility])    HTN (hypertension)    Hyperlipidemia    Hypothyroidism    Ischemic cardiomyopathy    a. 02/2012 Echo: EF 40-45%, ant/lat HK, Gr1 DD, Mild MR, PASP 81mmHg; b. 02/2017 Echo: EF 30-35%.   NSVT (nonsustained ventricular tachycardia) ([place])    a. 02/2012 post-MI   Obesity    Pancreatitis    S/P primary angioplasty with coronary stent 03/04/2018   Dual antiplatelet therapy INDEFINITELY per cardiology note Nov 2019   Tobacco abuse     Past Surgical History:  Procedure Laterality Date   ABDOMINAL HYSTERECTOMY     CARDIAC CATHETERIZATION  2013   [facility] s/p  stent   CARDIAC CATHETERIZATION Left 08/03/2015   Procedure: Left Heart Cath and Coronary Angiography;  Surgeon: Wellington Hampshire, MD;  Location: [facility];  Service: Cardiovascular;  Laterality: Left;   DILATION AND CURETTAGE OF UTERUS     LEFT HEART CATHETERIZATION WITH CORONARY ANGIOGRAM N/A 02/11/2012   Procedure: LEFT HEART CATHETERIZATION WITH CORONARY ANGIOGRAM;  Surgeon: [name], MD;  Location: [facility];  Service: Cardiovascular;  Laterality: N/A;     Allergies  No Known Allergies  History of Present Illness    48 y/o ? with the above complex PMH including CAD s/p anterolateral STEMI in November 2013, ischemic cardiomyopathy, chronic combined systolic and diastolic congestive heart failure, HTN, HL, tob abuse, and pancreatitis.  She suffered an anterolateral STEMI in November 2013 in the setting of a total occlusion of the left main.  This was successfully treated with a drug-eluting stent and she also required PTCA of the LAD.  She otherwise had nonobstructive disease.  Stress testing in April 2017 showed an area of anterior infarct with mild peri-infarct ischemia.  This was performed in the setting of atypical chest pain was subsequently followed by diagnostic catheterization which showed widely patent ostial left main stent with 60% mid LAD stenosis and otherwise nonobstructive disease.  She was medically managed.  More recently, stress testing November 2018 showed similar anterior infarct with mild peri-infarct ischemia and she has been  medically managed since.  Her most recent echocardiogram in November 2018 showed an EF of 30 to 35%.  She has been managed medically from a heart failure standpoint and in November 2019, at her last visit, she reported lightheadedness and positional dizziness in that setting, she had reduced her lisinopril on her own from 10 mg to 5 mg daily.  At that visit, she was switched from carvedilol to low-dose Toprol-XL-12.5 mg daily.  She  says that since her last office visit she continued to have dyspnea on exertion.  She was apparently scheduled for an outpatient echocardiogram but this was delayed in the setting of COVID-19.  Over the past 7 months, she has continued to note significant dyspnea on exertion with limited activity.  She still smokes a third of a pack a day.  She is not sure if she wheezes or not and does not use inhalers.  She denies any PND, orthopnea, or lower extremity edema.  She believes her weight has been pretty stable.  Beginning in about April, she started noticing epigastric pain every morning that was tender and moved around her rib cage bilaterally and into her back.  She was evaluated emergency department in April for this and was found to have an elevated lipase and she was told that she has pancreatitis and should limit her alcohol intake.  She does not think that she was ever a heavy drinker prior to that.  Since then, she has not been drinking at all.  She continues to have epigastric pain on a daily basis which is persistent throughout the day.  She sometimes has nausea associated with this in the mornings but this generally resolves by late morning/early afternoon.  Over the past 3 to 4 days, she has been having nausea and vomiting associated with epigastric pain but as before, it resolves after one episode.  On the morning of July 30, she had worsening nausea and incessant vomiting and as a result, she presented to the emergency department.  There, she was markedly hypertensive at 241/141.  Troponin was elevated at 402.  LFTs and lipase were within normal limits.  White count was mildly elevated at 10.6.  CT angiogram of the chest, abdomen and pelvis was performed and showed:  1. No aneurysm or dissection involving the aorta, major mesenteric, and major pelvic arterial vessels. 2. Extensive aortic and proximal pelvic arterial atherosclerotic calcification. Areas of hemodynamically significant obstruction noted  in the proximal left renal artery as well as in the left common iliac artery. 3. 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 There is also hepatic steatosis. No focal liver lesions appreciable on this study. 4. Reflux of contrast into the inferior vena cava and hepatic veins likely represents increase in right heart pressure. 5. No bowel obstruction. No abscess in the abdomen or pelvis. Appendix region appears normal. 6. No evident renal or ureteral calculus. No hydronephrosis. Urinary bladder wall thickness normal. 7. Stable adrenal hypertrophy bilaterally.  Pulmonary nodules were also noted w/ rec for f/u.  We have been asked to evaluate in the setting of epigastric pain and mild troponin elevation.  She continues to have epigastric pain this AM.  Of note, she ran out of her lisinopril a week or so ago.  She has been taking metoprolol 25mg  daily @ home as her BP's have been running high for a few months, though not as high as yesterday.  Inpatient Medications     aspirin EC  81 mg Oral Daily   atorvastatin  40 mg Oral q1800   clopidogrel  75 mg Oral Daily   enoxaparin (LOVENOX) injection  40 mg Subcutaneous Q24H   gabapentin  300 mg Oral BID   levothyroxine  112 mcg Oral Q0600   lisinopril  10 mg Oral Daily   metoprolol succinate  12.5 mg Oral Daily   PARoxetine  40 mg Oral Daily   potassium chloride  40 mEq Oral Once   sodium chloride flush  3 mL Intravenous Q12H    Family History    Family History  Problem Relation Age of Onset   Hypertension Father    Thyroid disease Father    AAA (abdominal aortic aneurysm) Father    Hypertension Mother    Hyperlipidemia Mother    Diabetes Mother    Kidney disease Mother    Hypertension Sister    Hypertension Sister    Supraventricular tachycardia Sister    Hypertension Brother    Hypertension Brother    Hypertension Daughter    Cancer Paternal Grandfather        lung   She indicated that her mother is deceased. She  indicated that her father is alive. She indicated that both of her sisters are alive. She indicated that both of her brothers are alive. She indicated that her maternal grandmother is deceased. She indicated that her maternal grandfather is deceased. She indicated that her paternal grandmother is deceased. She indicated that her paternal grandfather is deceased. She indicated that all of her three daughters are alive.   Social History    Social History   Socioeconomic History   Marital status: Legally Separated    Spouse name: Not on file   Number of children: Not on file   Years of education: Not on file   Highest education level: Not on file  Occupational History   Not on file  Social Needs   Financial resource strain: Not on file   Food insecurity    Worry: Not on file    Inability: Not on file   Transportation needs    Medical: Not on file    Non-medical: Not on file  Tobacco Use   Smoking status: Current Every Day Smoker    Packs/day: 0.25    Years: 30.00    Pack years: 7.50    Types: Cigarettes   Smokeless tobacco: Never Used  Substance and Sexual Activity   Alcohol use: Not Currently    Comment: Says she previously drank socially - never a heavy drinker.  No etoh since 08/2018.   Drug use: No    Types: Marijuana   Sexual activity: Not on file  Lifestyle   Physical activity    Days per week: Not on file    Minutes per session: Not on file   Stress: Not on file  Relationships   Social connections    Talks on phone: Not on file    Gets together: Not on file    Attends religious service: Not on file    Active member of club or organization: Not on file    Attends meetings of clubs or organizations: Not on file    Relationship status: Not on file   Intimate partner violence    Fear of current or ex partner: Not on file    Emotionally abused: Not on file    Physically abused: Not on file    Forced sexual activity: Not on file  Other Topics  Concern   Not on file  Social History Narrative  Lives in [place] w/ her dtr.  Does not routinely exercise.  Disabled/doesn't work.     Review of Systems    General:  No chills, fever, night sweats or weight changes.  Cardiovascular:  No chest pain, +++ chronic/progressive dyspnea on exertion, no edema, orthopnea, palpitations, paroxysmal nocturnal dyspnea. Dermatological: No rash, lesions/masses Respiratory: No cough, +++ dyspnea Urologic: No hematuria, dysuria Abdominal:   +++ epigastric pain daily/constant since April.  +++ nausea w/ vomiting for 3 days prior to admission, +++ daily diarrhea assoc w/ nausea and epigastric pain, no bright red blood per rectum, melena, or hematemesis Neurologic:  No visual changes, wkns, changes in mental status. All other systems reviewed and are otherwise negative except as noted above.  Physical Exam    Blood pressure (!) 149/108, pulse 64, temperature 98.1 F (36.7 C), temperature source Oral, resp. rate 17, height 5\' 7"  (1.702 m), weight 77.1 kg, SpO2 97 %.  General: Pleasant, NAD Psych: Normal affect. Neuro: Alert and oriented X 3. Moves all extremities spontaneously. HEENT: Normal  Neck: Supple without bruits or JVD. Lungs:  Resp regular and unlabored, CTA. Heart: RRR no s3, s4, or murmurs. Abdomen: Soft, epigastric tenderness, non-distended, BS + x 4.  Extremities: No clubbing, cyanosis or edema. DP/PT/Radials 2+ and equal bilaterally.  Labs    High Sensitivity Troponin Recent Labs  Lab 11/12/18 1041 11/12/18 1342  TROPONINIHS 40* 40*      Lab Results  Component Value Date   WBC 6.5 11/13/2018   HGB 13.3 11/13/2018   HCT 42.0 11/13/2018   MCV 91.9 11/13/2018   PLT 224 11/13/2018    Recent Labs  Lab 11/12/18 1041 11/13/18 0628  NA 143 143  K 3.4* 3.2*  CL 106 108  CO2 20* 26  BUN 21* 22*  CREATININE 1.48* 1.41*  CALCIUM 9.7 8.6*  PROT 7.6  --   BILITOT 2.0*  --   ALKPHOS 108  --   ALT 23  --   AST 35  --     GLUCOSE 165* 91   Lab Results  Component Value Date   CHOL 140 11/13/2018   HDL 29 (L) 11/13/2018   LDLCALC 96 11/13/2018   TRIG 74 11/13/2018    Radiology Studies    Ct Angio Chest/abd/pel For Dissection W And/or Wo Contrast  Result Date: 11/12/2018 CLINICAL DATA:  Chest and abdominal pain radiating toward back. Nausea and vomiting. EXAM: CT ANGIOGRAPHY CHEST, ABDOMEN AND PELVIS TECHNIQUE: IMPRESSION: CT angiogram chest: 1. No thoracic aortic aneurysm or dissection. There are foci of aortic atherosclerosis as well as foci of coronary artery calcification. 2. Cardiomegaly with pericardial effusion, findings noted previously. 3.  No demonstrable pulmonary embolus. 4. Multiple pulmonary nodular opacities, largest measuring 7 x 5 mm. Non-contrast chest CT at 6-12 months is recommended. If the nodule is stable at time of repeat CT, then future CT at 18-24 months (from today's scan) is considered optional for low-risk patients, but is recommended for high-risk patients. This recommendation follows the consensus statement: Guidelines for Management of Incidental Pulmonary Nodules Detected on CT Images: From the Fleischner Society 2017; Radiology 2017; 284:228-243. No lung edema or consolidation. 5.  No evident thoracic adenopathy. CT angiogram abdomen; CT angiogram pelvis: 1. No aneurysm or dissection involving the aorta, major mesenteric, and major pelvic arterial vessels. 2. Extensive aortic and proximal pelvic arterial atherosclerotic calcification. Areas of hemodynamically significant obstruction noted in the proximal left renal artery as well as in the left common iliac artery. 3. Suspect  a degree of hepatic cirrhosis. There is also hepatic steatosis. No focal liver lesions appreciable on this study. 4. Reflux of contrast into the inferior vena cava and hepatic veins likely represents increase in right heart pressure. 5. No bowel obstruction. No abscess in the abdomen or pelvis. Appendix region  appears normal. 6. No evident renal or ureteral calculus. No hydronephrosis. Urinary bladder wall thickness normal. 7. Stable adrenal hypertrophy bilaterally. Clinical significance of this finding uncertain. Appropriate laboratory correlation may be advisable, particularly if clinical symptoms should warrant. 8. Uterus absent. Electronically Signed   By: Lowella Grip III M.D.   On: 11/12/2018 13:57    ECG & Cardiac Imaging    RSR, 81, LAD, LAFB, BAE, septal infarct, borderline LVH - personally reviewed.  Assessment & Plan    1.  Elevated troponin/coronary artery disease: Patient with known prior history of anterolateral STEMI with left main stenting in 2013.  Subsequent catheterization in 2017 showed patency of the left main stent and nonobstructive disease in the LAD.  Low risk Myoview in 2018.  She has not been experiencing angina at home but does have chronic, progressive dyspnea on exertion in the setting of ischemic cardiomyopathy and ongoing tobacco abuse.  She has been having epigastric pain in a somewhat constant fashion since at least April of this year and presented to the hospital due to worsening nausea and vomiting associated with his pain.  In that setting, troponin was elevated at 40 with delta troponin also 40.  This does not represent acute coronary syndrome.  An echocardiogram is pending.  Given heart failure history we expect her to have depressed LV function.  Would not pursue ischemic evaluation at this time.  Given epigastric pain, suspect primary GI etiology.  Continue cardiac medications including aspirin, statin, Plavix, and beta-blocker therapy.  I am going to discontinue her ACE inhibitor given findings of hemodynamically significant unilateral L renal artery stenosis on CTA yesterday.    2.  Epigastric pain, nausea, vomiting/? pancreatitis: Pain has been ongoing since April and appears to be constant in fashion.  She was previously told she had pancreatitis in the setting  of elevated lipase in March and April.  She used to drink socially but says she has not had a drink in a few months.  Nausea and vomiting have been present for a few days and she thought she saw blood in her emesis yesterday.  She is tender on examination.  She does not have symptoms consistent with mesenteric ischemia, though she does have atherosclerosis throughout her abdomen on CTA.  Recommend GI evaluation.  3.  Ischemic cardiomyopathy/chronic combined systolic and diastolic congestive heart failure: EF 30 to 35% by echocardiogram in November 2018.  Euvolemic on examination today.  Historically, titration of medications limited by relative hypotension.  She notes that over the past 2 months though, her blood pressure has been more elevated and she has been taking Toprol-XL 25 mg daily.  She recently ran out of lisinopril.  Blood pressure on arrival to the emergency department was 241/141.  As above, I am going to discontinue lisinopril in the setting of left renal artery stenosis noted on CTA.  I will switch her Toprol to carvedilol.  Follow blood pressures and consider hydralazine/nitrate or Entresto pending further evaluation of left renal artery and echo.  Ideally would plan to f/u echo at some point in the future, when she has been on GDMT in a consistent fashion, to determine candidacy for ICD placement.  4.  Left renal artery stenosis: Incidentally noted on CT angiogram yesterday.  To be seen by vascular surgery today for angiography.  Will hold ACE inhibitor.  5.  Stage III-IV chronic kidney disease: Creatinine relatively stable overall.  Follow with pending angiography.  6.  Hypertensive urgency: Blood pressure 241/141 on arrival to the emergency department.  As noted above, previously blood pressure soft, limiting titration of heart failure medications.  CT angiography of the abdomen/pelvis suggests left renal artery stenosis.  Change metoprolol to carvedilol.  Holding ACE inhibitor for now.   Further recommendations following renal angiogram.  7.  Hyperlipidemia: LDL was 96 this morning.  She is on atorvastatin therapy-40 mg.  Will increase to 80 mg.  Will need follow-up lipids and LFTs in about 6 weeks and potentially addition of Zetia, +/- [id number] in the future.    8.  Tob Abuse:  Still smoking 1/3 ppd.  Complete cessation advised.  9.  Pulmonary nodules:  CTA showed multiple pulmonary nodular opacities, largest measuring 7 x 5 mm. Will need f/u noncontrast CT in 6-12 mos.  10.  Hypokalemia:  Supplementation ordered.  Signed, Murray Hodgkins, NP 11/13/2018, 11:48 AM  For questions or updates, please contact   Please consult [email or web address] for contact info under Cardiology/STEMI.

## 2018-11-14 LAB — BASIC METABOLIC PANEL
Anion gap: 8 (ref 5–15)
BUN: 28 mg/dL — ABNORMAL HIGH (ref 6–20)
CO2: 25 mmol/L (ref 22–32)
Calcium: 8.3 mg/dL — ABNORMAL LOW (ref 8.9–10.3)
Chloride: 107 mmol/L (ref 98–111)
Creatinine, Ser: 1.48 mg/dL — ABNORMAL HIGH (ref 0.44–1.00)
GFR calc Af Amer: 48 mL/min — ABNORMAL LOW (ref >60)
GFR calc non Af Amer: 41 mL/min — ABNORMAL LOW (ref >60)
Glucose, Bld: 91 mg/dL (ref 70–99)
Potassium: 3.6 mmol/L (ref 3.5–5.1)
Sodium: 140 mmol/L (ref 135–145)

## 2018-11-14 LAB — CBC
HCT: 44.8 % (ref 36.0–46.0)
Hemoglobin: 13.8 g/dL (ref 12.0–15.0)
MCH: 28.9 pg (ref 26.0–34.0)
MCHC: 30.8 g/dL (ref 30.0–36.0)
MCV: 93.9 fL (ref 80.0–100.0)
Platelets: 221 10*3/uL (ref 150–400)
RBC: 4.77 MIL/uL (ref 3.87–5.11)
RDW: 16.2 % — ABNORMAL HIGH (ref 11.5–15.5)
WBC: 7.1 10*3/uL (ref 4.0–10.5)
nRBC: 0 % (ref 0.0–0.2)

## 2018-11-14 MED ORDER — HYDRALAZINE HCL 25 MG PO TABS: 25.0000 mg | ORAL_TABLET | Freq: Three times a day (TID) | ORAL | 0 refills | Status: DC

## 2018-11-14 MED ORDER — ZOLPIDEM TARTRATE 5 MG PO TABS
5.0000 mg | ORAL_TABLET | Freq: Every evening | ORAL | Status: DC | PRN
  Administered 2018-11-14: 5 mg via ORAL
  Filled 2018-11-14: qty 1

## 2018-11-14 MED ORDER — OXYCODONE-ACETAMINOPHEN 5-325 MG PO TABS: 1.0000 | ORAL_TABLET | Freq: Four times a day (QID) | ORAL | 0 refills | Status: DC | PRN

## 2018-11-14 MED ORDER — PANTOPRAZOLE SODIUM 40 MG PO TBEC: 40.0000 mg | DELAYED_RELEASE_TABLET | Freq: Every day | ORAL | 0 refills | Status: DC

## 2018-11-14 MED ORDER — CARVEDILOL 6.25 MG PO TABS: 6.2500 mg | ORAL_TABLET | Freq: Two times a day (BID) | ORAL | 0 refills | Status: DC

## 2018-11-14 MED ORDER — ONDANSETRON HCL 4 MG/2ML IJ SOLN: 4.0000 mg | Freq: Four times a day (QID) | INTRAMUSCULAR | Status: DC | PRN

## 2018-11-14 MED ORDER — ONDANSETRON HCL 4 MG PO TABS: 4.0000 mg | ORAL_TABLET | Freq: Four times a day (QID) | ORAL | 0 refills | Status: DC | PRN

## 2018-11-14 MED ORDER — PAROXETINE HCL 40 MG PO TABS: 40.0000 mg | ORAL_TABLET | Freq: Every day | ORAL | 1 refills | Status: DC

## 2018-11-14 MED ORDER — OXYCODONE-ACETAMINOPHEN 5-325 MG PO TABS
1.0000 | ORAL_TABLET | Freq: Four times a day (QID) | ORAL | Status: DC | PRN
  Administered 2018-11-14: 1 via ORAL
  Filled 2018-11-14: qty 1

## 2018-11-14 NOTE — Discharge Summary (Signed)
[facility] NAME: Angelica Herrera    MR#:  [id number]  DATE OF BIRTH:  06-27-1970  DATE OF ADMISSION:  11/12/2018 ADMITTING PHYSICIAN: Dustin Flock, MD  DATE OF DISCHARGE: 11/14/2018   PRIMARY CARE PHYSICIAN: Arnetha Courser, MD    ADMISSION DIAGNOSIS:  Hypertensive urgency [I16.0] Intractable nausea and vomiting [R11.2]  DISCHARGE DIAGNOSIS:  Active Problems:   Chest pain   Accelerated hypertension Renal artery stenosis  SECONDARY DIAGNOSIS:   Past Medical History:  Diagnosis Date  . Arthritis    gout  . CAD (coronary artery disease)    a. 02/2012 s/p anterolateral STEMI/Cath/PCI: LM 100 (3.0x15 DES ), LAD sm, severely diseased (PTCA w/ 1.48mm balloon), LCX nl, RI moderate size, nl, RCA 30-73m, AM/PDA/PLA nl, R->L collats noted; b. cath 08/03/15: widely patent ostLM stent at 10%, mLAD 60%,  mRCA 30%, EF 35-40%, ant wall HK, mildly elevated LVEDP; c. 02/2017 MV: ant infarct, mild peri-infarct isch (similar to 2017 study)->Med Rx.  . Chronic combined systolic (congestive) and diastolic (congestive) heart failure ([place])    a. 02/2017 Echo: EF 30-35%, diff HK, sev HK/poss AK of ant and lat walls. Gr1 DD. Mild to mod MR. Nl RV fxn. Mod TR.  . CKD (chronic kidney disease), stage IV ([place])   . HTN (hypertension)   . Hyperlipidemia   . Hypothyroidism   . Ischemic cardiomyopathy    a. 02/2012 Echo: EF 40-45%, ant/lat HK, Gr1 DD, Mild MR, PASP 82mmHg; b. 02/2017 Echo: EF 30-35%.  . NSVT (nonsustained ventricular tachycardia) ([place])    a. 02/2012 post-MI  . Obesity   . Pancreatitis   . S/P primary angioplasty with coronary stent 03/04/2018   Dual antiplatelet therapy INDEFINITELY per cardiology note Nov 2019  . Tobacco abuse     HOSPITAL COURSE:   48 year old female patient with history of coronary disease, chronic congestive heart failure, CKD stage II, hypertension, hyperlipidemia, hypothyroidism, tobacco abuse  currently under hospitalist service.  -Renal artery stenosis CT angiogram reviewed Patient has been noncompliant with her meds Renal angiogram could not be done because the machine was not working Will be able to get the test done on Tuesday Appreciate vascular surgery evaluation (as the test cannot be done for next 3 to 4 days, vascular suggested to discharge patient home and follow as outpatient.)  -Abdominal pain Unlikely pancreatitis and mesenteric ischemia Mesenteric arteries have been patent on his recent CTA Pain improved, will discharge on oral pain medication PPI and antinausea medication.  -Peripheral arterial disease Noted on the CT of the pelvis Probably the cause of intermittent claudication in bilateral calves on ambulation Outpatient further work-up and follow-up No plan for any lower extremity leg intervention currently Continue aspirin, Plavix and statin medication  -Tobacco abuse Tobacco cessation counseled to the patient for 6 minutes Nicotine patch offered  -Accelerated hypertension Continue beta-blocker Add oral hydralazine  better now.  -Drop in hemoglobin and hematocrit might be secondary to hemodilution We will monitor hemoglobin hematocrit   DISCHARGE CONDITIONS:   Stable.  CONSULTS OBTAINED:  Treatment Team:  Katha Cabal, MD  DRUG ALLERGIES:  No Known Allergies  DISCHARGE MEDICATIONS:   Allergies as of 11/14/2018   No Known Allergies     Medication List    STOP taking these medications   lisinopril 10 MG tablet Commonly known as: ZESTRIL   metoprolol succinate 25 MG 24 hr tablet Commonly known as: Toprol XL     TAKE these  medications   Allergy Relief 10 MG tablet Generic drug: loratadine TAKE ONE TABLET BY MOUTH DAILY.   aspirin 81 MG tablet Take 1 tablet (81 mg total) by mouth daily.   atorvastatin 40 MG tablet Commonly known as: LIPITOR TAKE (1) TABLET BY MOUTH DAILY AT BEDTIME FOR CHOLESTEROL. *NEED  APPOINTMENT FOR FURTHER REFILLS*   carvedilol 6.25 MG tablet Commonly known as: COREG Take 1 tablet (6.25 mg total) by mouth 2 (two) times daily with a meal.   clopidogrel 75 MG tablet Commonly known as: PLAVIX TAKE (1) TABLET BY MOUTH EVERY DAY   Colchicine 0.6 MG Caps Commonly known as: Mitigare Two tablets by mouth once, then one pill one hour later; then one pill per day until flare resolves. No atorvastatin during medication   furosemide 20 MG tablet Commonly known as: LASIX Take 1 tablet (20 mg total) by mouth daily.   gabapentin 300 MG capsule Commonly known as: NEURONTIN Take 1 capsule (300 mg total) by mouth 2 (two) times daily.   hydrALAZINE 25 MG tablet Commonly known as: APRESOLINE Take 1 tablet (25 mg total) by mouth every 8 (eight) hours.   levothyroxine 112 MCG tablet Commonly known as: SYNTHROID Take 1 tablet (112 mcg total) by mouth daily before breakfast.   nitroGLYCERIN 0.4 MG SL tablet Commonly known as: NITROSTAT Place 1 tablet (0.4 mg total) under the tongue every 5 (five) minutes x 3 doses as needed for chest pain.   ondansetron 4 MG tablet Commonly known as: ZOFRAN Take 1 tablet (4 mg total) by mouth every 6 (six) hours as needed for nausea.   oxyCODONE-acetaminophen 5-325 MG tablet Commonly known as: PERCOCET/ROXICET Take 1 tablet by mouth every 6 (six) hours as needed for moderate pain.   pantoprazole 40 MG tablet Commonly known as: PROTONIX Take 1 tablet (40 mg total) by mouth daily. Start taking on: November 15, 2018   PARoxetine 40 MG tablet Commonly known as: PAXIL Take 1 tablet (40 mg total) by mouth daily.        DISCHARGE INSTRUCTIONS:   Follow with vascular clinic in 1 week.  If you experience worsening of your admission symptoms, develop shortness of breath, life threatening emergency, suicidal or homicidal thoughts you must seek medical attention immediately by calling 911 or calling your MD immediately  if symptoms less  severe.  You Must read complete instructions/literature along with all the possible adverse reactions/side effects for all the Medicines you take and that have been prescribed to you. Take any new Medicines after you have completely understood and accept all the possible adverse reactions/side effects.   Please note  You were cared for by a hospitalist during your hospital stay. If you have any questions about your discharge medications or the care you received while you were in the hospital after you are discharged, you can call the unit and asked to speak with the hospitalist on call if the hospitalist that took care of you is not available. Once you are discharged, your primary care physician will handle any further medical issues. Please note that NO REFILLS for any discharge medications will be authorized once you are discharged, as it is imperative that you return to your primary care physician (or establish a relationship with a primary care physician if you do not have one) for your aftercare needs so that they can reassess your need for medications and monitor your lab values.    Today   CHIEF COMPLAINT:   Chief Complaint  Patient presents  with  . Pancreatitis  . Abdominal Pain  . Emesis  . Nausea    HISTORY OF PRESENT ILLNESS:  Angelica Herrera  is a 49 y.o. female with a known history of multiple medical problems including coronary artery disease, congestive heart failure, chronic kidney disease, essential hypertension, hyperlipidemia, hypothyroidism, tobacco abuse who is presenting to the hospital with complaining of having abdominal pain as well as mid lower sternal chest pain.  Apparently patient has ran out of some of her blood pressure medications.  When she arrived in the emergency room blood pressure was 241/141.  Patient denies any fevers she does complain of occasional chills she also states that the abdomen pain was in the epigastric region.  Evaluation in the emergency  room with the CTA of her abdomen shows significant arthrosclerotic disease in the arteries in her abdomen.    VITAL SIGNS:  Blood pressure (!) 170/116, pulse 76, temperature 97.6 F (36.4 C), temperature source Oral, resp. rate 18, height 5\' 7"  (1.702 m), weight 79.1 kg, SpO2 96 %.  I/O:    Intake/Output Summary (Last 24 hours) at 11/14/2018 1342 Last data filed at 11/14/2018 7408 Gross per 24 hour  Intake 237 ml  Output 1050 ml  Net -813 ml    PHYSICAL EXAMINATION:  GENERAL:  48 y.o.-year-old patient lying in the bed with no acute distress.  EYES: Pupils equal, round, reactive to light and accommodation. No scleral icterus. Extraocular muscles intact.  HEENT: Head atraumatic, normocephalic. Oropharynx and nasopharynx clear.  NECK:  Supple, no jugular venous distention. No thyroid enlargement, no tenderness.  LUNGS: Normal breath sounds bilaterally, no wheezing, rales,rhonchi or crepitation. No use of accessory muscles of respiration.  CARDIOVASCULAR: S1, S2 normal. No murmurs, rubs, or gallops.  ABDOMEN: Soft, non-tender, non-distended. Bowel sounds present. No organomegaly or mass.  EXTREMITIES: No pedal edema, cyanosis, or clubbing.  NEUROLOGIC: Cranial nerves II through XII are intact. Muscle strength 5/5 in all extremities. Sensation intact. Gait not checked.  PSYCHIATRIC: The patient is alert and oriented x 3.  SKIN: No obvious rash, lesion, or ulcer.   DATA REVIEW:   CBC Recent Labs  Lab 11/14/18 0457  WBC 7.1  HGB 13.8  HCT 44.8  PLT 221    Chemistries  Recent Labs  Lab 11/12/18 1041 11/13/18 0628 11/14/18 0457  NA 143 143 140  K 3.4* 3.2* 3.6  CL 106 108 107  CO2 20* 26 25  GLUCOSE 165* 91 91  BUN 21* 22* 28*  CREATININE 1.48* 1.41* 1.48*  CALCIUM 9.7 8.6* 8.3*  MG  --  2.0  --   AST 35  --   --   ALT 23  --   --   ALKPHOS 108  --   --   BILITOT 2.0*  --   --     Cardiac Enzymes No results for input(s): TROPONINI in the last 168  hours.  Microbiology Results  Results for orders placed or performed during the hospital encounter of 11/12/18  SARS Coronavirus 2 (CEPHEID- Performed in [facility] hospital lab), Hosp Order     Status: None   Collection Time: 11/12/18  1:42 PM   Specimen: Nasopharyngeal Swab  Result Value Ref Range Status   SARS Coronavirus 2 NEGATIVE NEGATIVE Final    Comment: (NOTE) If result is NEGATIVE SARS-CoV-2 target nucleic acids are NOT DETECTED. The SARS-CoV-2 RNA is generally detectable in upper and lower  respiratory specimens during the acute phase of infection. The lowest  concentration of  SARS-CoV-2 viral copies this assay can detect is 250  copies / mL. A negative result does not preclude SARS-CoV-2 infection  and should not be used as the sole basis for treatment or other  patient management decisions.  A negative result may occur with  improper specimen collection / handling, submission of specimen other  than nasopharyngeal swab, presence of viral mutation(s) within the  areas targeted by this assay, and inadequate number of viral copies  (<250 copies / mL). A negative result must be combined with clinical  observations, patient history, and epidemiological information. If result is POSITIVE SARS-CoV-2 target nucleic acids are DETECTED. The SARS-CoV-2 RNA is generally detectable in upper and lower  respiratory specimens dur ing the acute phase of infection.  Positive  results are indicative of active infection with SARS-CoV-2.  Clinical  correlation with patient history and other diagnostic information is  necessary to determine patient infection status.  Positive results do  not rule out bacterial infection or co-infection with other viruses. If result is PRESUMPTIVE POSTIVE SARS-CoV-2 nucleic acids MAY BE PRESENT.   A presumptive positive result was obtained on the submitted specimen  and confirmed on repeat testing.  While 2019 novel coronavirus  (SARS-CoV-2) nucleic acids  may be present in the submitted sample  additional confirmatory testing may be necessary for epidemiological  and / or clinical management purposes  to differentiate between  SARS-CoV-2 and other Sarbecovirus currently known to infect humans.  If clinically indicated additional testing with an alternate test  methodology [phone]) is advised. The SARS-CoV-2 RNA is generally  detectable in upper and lower respiratory sp ecimens during the acute  phase of infection. The expected result is Negative. Fact Sheet for Patients:  StrictlyIdeas.no Fact Sheet for Healthcare Providers: BankingDealers.co.za This test is not yet approved or cleared by the [place] FDA and has been authorized for detection and/or diagnosis of SARS-CoV-2 by FDA under an Emergency Use Authorization (EUA).  This EUA will remain in effect (meaning this test can be used) for the duration of the COVID-19 declaration under Section 564(b)(1) of the Act, 21 U.S.C. section 360bbb-3(b)(1), unless the authorization is terminated or revoked sooner. Performed at [facility], [address], [place], [place] [address]     RADIOLOGY:  No results found.  EKG:   Orders placed or performed during the hospital encounter of 11/12/18  . ED EKG  . ED EKG  . EKG 12-Lead  . EKG 12-Lead      Management plans discussed with the patient, family and they are in agreement.  CODE STATUS:     Code Status Orders  (From admission, onward)         Start     Ordered   11/12/18 1711  Full code  Continuous     11/12/18 1710        Code Status History    This patient has a current code status but no historical code status.   Advance Care Planning Activity      TOTAL TIME TAKING CARE OF THIS PATIENT: 35 minutes.    Vaughan Basta M.D on 11/14/2018 at 1:42 PM  Between 7am to 6pm - Pager - [phone]  After 6pm go to [email or web address] - password [email or web address]  [facility]  Office  [phone]  CC: Primary care physician; Arnetha Courser, MD   Note: This dictation was prepared with Dragon dictation along with smaller phrase technology. Any transcriptional errors that result from this process are unintentional.

## 2018-11-14 NOTE — Progress Notes (Signed)
Next week to be scheduled.   Subjective/Chief Complaint: Patient comfortable, ready to go home and then will return for angiography.   Objective: Vital signs in last 24 hours: Temp:  [97.6 F (36.4 C)-98.3 F (36.8 C)] 97.6 F (36.4 C) (08/01 0804) Pulse Rate:  [65-76] 76 (08/01 0804) Resp:  [18-20] 18 (08/01 0440) BP: (113-170)/(79-116) 170/116 (08/01 0804) SpO2:  [92 %-98 %] 96 % (08/01 0804) Weight:  [77.1 kg-79.1 kg] 79.1 kg (08/01 0107) Last BM Date: 11/12/18  Intake/Output from previous day: 07/31 0701 - 08/01 0700 In: 87 [P.O.:237] Out: 450 [Urine:450] Intake/Output this shift: Total I/O In: -  Out: 600 [Urine:600]  General appearance: alert, cooperative and appears older than stated age Head: Normocephalic, without obvious abnormality, atraumatic Extremities: extremities normal, atraumatic, no cyanosis or edema and no edema, redness or tenderness in the calves or thighs  Lab Results:  Recent Labs    11/13/18 0628 11/13/18 1812 11/14/18 0457  WBC 6.5  --  7.1  HGB 13.3 13.8 13.8  HCT 42.0 43.8 44.8  PLT 224  --  221   BMET Recent Labs    11/13/18 0628 11/14/18 0457  NA 143 140  K 3.2* 3.6  CL 108 107  CO2 26 25  GLUCOSE 91 91  BUN 22* 28*  CREATININE 1.41* 1.48*  CALCIUM 8.6* 8.3*   PT/INR No results for input(s): LABPROT, INR in the last 72 hours. ABG No results for input(s): PHART, HCO3 in the last 72 hours.  Invalid input(s): PCO2, PO2  Studies/Results: Ct Angio Chest/abd/pel For Dissection W And/or Wo Contrast  Result Date: 11/12/2018 CLINICAL DATA:  Chest and abdominal pain radiating toward back. Nausea and vomiting. EXAM: CT ANGIOGRAPHY CHEST, ABDOMEN AND PELVIS TECHNIQUE: Initially, axial CT images were obtained through the chest and upper abdomen without intravenous contrast material administration. Multidetector CT imaging through the chest, abdomen and pelvis was performed using the standard protocol during bolus administration of  intravenous contrast. Multiplanar reconstructed images and MIPs were obtained and reviewed to evaluate the vascular anatomy. CONTRAST:  69mL OMNIPAQUE IOHEXOL 350 MG/ML SOLN COMPARISON:  CT abdomen and pelvis June 26, 2018 FINDINGS: CTA CHEST FINDINGS Cardiovascular: There is no intramural hematoma seen in the thoracic and upper abdominal aorta on noncontrast enhanced images. There is no demonstrable thoracic aortic aneurysm or dissection. There are foci of aortic atherosclerosis. The visualized great vessels appear unremarkable. The right innominate and left common carotid arteries arise as a common trunk, an anatomic variant. The left vertebral artery arises directly from the aortic arch, an anatomic variant. There is cardiomegaly with a focal pericardial effusion. The pericardium does not appear appreciably thickened. There are foci of coronary artery calcification. There is no demonstrable pulmonary embolus. Mediastinum/Nodes: Visualized portions of the thyroid appear quite diminutive without focal lesion. There is no demonstrable thoracic adenopathy. No esophageal lesions are appreciable. Lungs/Pleura: There are bullae in the upper lobes peripherally. There is no appreciable edema or consolidation. There is atelectatic change in the left base region. There is also atelectasis along the medial aspect of the right middle lobe. No evident pleural effusion. On axial slice 78 series 6, there is a 4 mm nodular opacity in the right middle lobe. On axial slice 84 series 6, there is a 7 x 5 mm nodular opacity in the lateral segment of the right lower lobe. On axial slice 90 series 6, there is a 5 mm nodular opacity in the superior segment of the right lower lobe. On axial slice  95 series 6, there is a 4 mm nodular opacity abutting the pleura in the lateral segment of the right lower lobe. On axial slice 793 series 6, there is a 4 mm nodular opacity abutting the pleura in the lateral segment of the left lower lobe. On  axial slice 903 series 6, there is a 2 mm nodular opacity in the inferior most aspect of the lingula. On axial slice 009 series 6, there is a 4 mm nodular opacity in the anterior segment of the left lower lobe. There is a 2 mm nodular opacity in the lateral segment of the left lower lobe on axial slice 233 series 6. Musculoskeletal: No blastic or lytic bone lesions. No chest wall lesions evident. Review of the MIP images confirms the above findings. CTA ABDOMEN AND PELVIS FINDINGS VASCULAR Aorta: There is no abdominal aortic aneurysm or dissection. There is atherosclerotic calcification throughout much of the abdominal aorta with areas of mild peripheral thrombus at several sites. Celiac: The celiac artery and its major branches appear widely patent with minimal atherosclerotic calcification at the origin of the celiac artery. No aneurysm or dissection involving these vessels evident. SMA: Superior mesenteric artery arises immediately inferior to the superior mesenteric artery. The superior mesenteric artery and its major branches appear widely patent. No aneurysm or dissection. Renals: There is a single renal artery on each side. There is focal calcification at the origin of the right renal artery with approximately 30% diameter stenosis. There is noncalcified plaque at the origin of the left renal artery with a focal area of short-segment narrowing of age in 90% period no aneurysm or dissection involves the renal arteries and their branches. No fibromuscular dysplasia evident on either side. IMA: Inferior mesenteric artery and its branches appear patent without aneurysm or dissection. Inflow: There is extensive plaque in each common iliac artery. There is also thrombus in the proximal common iliac arteries. There is approximately 50% diameter stenosis in the proximal right common iliac artery with 60-70% diameter stenosis in the proximal left common iliac artery. More distally in the left common iliac artery,  there is approximately 75-80% diameter stenosis. There is moderate calcification at the origin of the left and right internal iliac arteries. There is approximately 50% diameter stenosis at the origin of the left external iliac artery. Milder atherosclerotic irregularity noted in the proximal right external iliac artery. The more distal external iliac arteries are patent. Each common femoral artery is patent. The proximal right superficial and profunda femoral arteries appear patent without aneurysm or dissection involving pelvic arterial vessels. Veins: No obvious venous abnormality within the limitations of this arterial phase study beyond apparent reflux of contrast into the hepatic veins and inferior vena cava. Review of the MIP images confirms the above findings. NON-VASCULAR Hepatobiliary: The liver contour is subtly nodular. There is hepatic steatosis. Liver measures 20.9 cm in length. No focal liver lesions are evident. The gallbladder wall is not appreciably thickened. There is no biliary duct dilatation. Pancreas: There is no appreciable pancreatic mass or inflammatory focus. Spleen: No splenic lesions are evident. Adrenals/Urinary Tract: There is adrenal hypertrophy bilaterally, essentially stable. Kidneys bilaterally show no evident mass or hydronephrosis on either side. There is no appreciable renal or ureteral calculus on either side. Urinary bladder is midline with wall thickness within normal limits. Stomach/Bowel: There is no appreciable bowel wall or mesenteric thickening. There is no demonstrable bowel obstruction. Terminal ileum appears normal. There is no evident free air or portal venous air. Lymphatic: No  evident adenopathy in the abdomen or pelvis. Reproductive: Uterus absent.  No evident pelvic mass. Other: No periappendiceal region inflammatory change. Appendix appears normal. No abscess or ascites evident in the abdomen or pelvis. Musculoskeletal: There is degenerative change in the lumbar  spine. There is vacuum phenomenon at L5-S1. No blastic or lytic bone lesions. There is no intramuscular or abdominal wall lesion evident. Review of the MIP images confirms the above findings. IMPRESSION: CT angiogram chest: 1. No thoracic aortic aneurysm or dissection. There are foci of aortic atherosclerosis as well as foci of coronary artery calcification. 2. Cardiomegaly with pericardial effusion, findings noted previously. 3.  No demonstrable pulmonary embolus. 4. Multiple pulmonary nodular opacities, largest measuring 7 x 5 mm. Non-contrast chest CT at 6-12 months is recommended. If the nodule is stable at time of repeat CT, then future CT at 18-24 months (from today's scan) is considered optional for low-risk patients, but is recommended for high-risk patients. This recommendation follows the consensus statement: Guidelines for Management of Incidental Pulmonary Nodules Detected on CT Images: From the Fleischner Society 2017; Radiology 2017; 284:228-243. No lung edema or consolidation. 5.  No evident thoracic adenopathy. CT angiogram abdomen; CT angiogram pelvis: 1. No aneurysm or dissection involving the aorta, major mesenteric, and major pelvic arterial vessels. 2. Extensive aortic and proximal pelvic arterial atherosclerotic calcification. Areas of hemodynamically significant obstruction noted in the proximal left renal artery as well as in the left common iliac artery. 3. 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 There is also hepatic steatosis. No focal liver lesions appreciable on this study. 4. Reflux of contrast into the inferior vena cava and hepatic veins likely represents increase in right heart pressure. 5. No bowel obstruction. No abscess in the abdomen or pelvis. Appendix region appears normal. 6. No evident renal or ureteral calculus. No hydronephrosis. Urinary bladder wall thickness normal. 7. Stable adrenal hypertrophy bilaterally. Clinical significance of this finding uncertain. Appropriate  laboratory correlation may be advisable, particularly if clinical symptoms should warrant. 8. Uterus absent. Electronically Signed   By: Lowella Grip III M.D.   On: 11/12/2018 13:57    Anti-infectives: Anti-infectives (From admission, onward)   None      Assessment/Plan: s/p Procedure(s): RENAL INTERVENTION (N/A) Okay to discharge and return for elective angiography and possible renal aretry stenting.   LOS: 1 day    Elmore Guise 11/14/2018

## 2018-11-14 NOTE — Progress Notes (Signed)
Advanced care plan. Purpose of the Encounter: CODE STATUS Parties in Attendance:Patient Patient's Decision Capacity:Good Subjective/Patient's story: : Memory Angelica Herrera  is a 48 y.o. female with a known history of multiple medical problems including coronary artery disease, congestive heart failure, chronic kidney disease, essential hypertension, hyperlipidemia, hypothyroidism, tobacco abuse who is presenting to the hospital with complaining of having abdominal pain as well as mid lower sternal chest pain.  Apparently patient has ran out of some of her blood pressure medications.  When she arrived in the emergency room blood pressure was 241/141.  Patient denies any fevers she does complain of occasional chills she also states that the abdomen pain was in the epigastric region.  Evaluation in the emergency room with the CTA of her abdomen shows significant arthrosclerotic disease in the arteries in  Objective/Medical story Patient needs control of blood pressure. She needs cardiology evaluation for atypical chest pain. Needs renal angiogram and vascular surgery consult for renal artery stenosis. Goals of care determination:  Advance care directives and goals of care discussed. Patient wants everything done which includes cpr, intubation and ventilator if need arises. CODE STATUS: Full codeTime spent discussing advanced care planning: 16 minu

## 2018-11-14 NOTE — Progress Notes (Addendum)
Prescription went to [facility].  Angelica Herrera says they close at 1300 and don't open until Monday.  We are printing her prescriptions so she can get them filled today.  Discharged to home with her daughter.  Renal Angiogram scheduled for Tuesday.  Instructed her not to eat or drink after midnight.

## 2018-11-16 ENCOUNTER — Other Ambulatory Visit (INDEPENDENT_AMBULATORY_CARE_PROVIDER_SITE_OTHER): Payer: Self-pay | Admitting: Vascular Surgery

## 2018-11-16 ENCOUNTER — Telehealth (INDEPENDENT_AMBULATORY_CARE_PROVIDER_SITE_OTHER): Payer: Self-pay

## 2018-11-16 NOTE — Telephone Encounter (Signed)
I spoke with the patient and gave her the pre-procedure instructions of being NPO after midnight tonight , taking her medications with small sips of water.  I did ask about her Covid test and the patient stated she had it when she was admitted and she came home and hasn't left since getting home. I did let her know I would ask pre-admit if that is okay and a message was left with pre-admission for a return call about this. Patient was also given the time of her arrival to the [facility] of 9:15 am.

## 2018-11-17 ENCOUNTER — Encounter
Admission: RE | Disposition: A | Discharge status: Home / Self Care | Payer: Self-pay | Source: Ambulatory Visit | Attending: Vascular Surgery

## 2018-11-17 ENCOUNTER — Encounter: Payer: Self-pay | Admitting: Vascular Surgery

## 2018-11-17 ENCOUNTER — Other Ambulatory Visit: Payer: Self-pay

## 2018-11-17 ENCOUNTER — Ambulatory Visit
Admission: RE | Admit: 2018-11-17 | Discharge: 2018-11-17 | Disposition: A | Discharge status: Home / Self Care | Payer: Medicaid Other | Source: Ambulatory Visit | Attending: Vascular Surgery | Admitting: Vascular Surgery

## 2018-11-17 DIAGNOSIS — Z841 Family history of disorders of kidney and ureter: Secondary | ICD-10-CM | POA: Diagnosis not present

## 2018-11-17 DIAGNOSIS — Z6826 Body mass index (BMI) 26.0-26.9, adult: Secondary | ICD-10-CM | POA: Diagnosis not present

## 2018-11-17 DIAGNOSIS — E669 Obesity, unspecified: Secondary | ICD-10-CM | POA: Diagnosis not present

## 2018-11-17 DIAGNOSIS — Z833 Family history of diabetes mellitus: Secondary | ICD-10-CM | POA: Insufficient documentation

## 2018-11-17 DIAGNOSIS — I150 Renovascular hypertension: Secondary | ICD-10-CM | POA: Diagnosis not present

## 2018-11-17 DIAGNOSIS — E039 Hypothyroidism, unspecified: Secondary | ICD-10-CM | POA: Insufficient documentation

## 2018-11-17 DIAGNOSIS — Z992 Dependence on renal dialysis: Secondary | ICD-10-CM | POA: Insufficient documentation

## 2018-11-17 DIAGNOSIS — I132 Hypertensive heart and chronic kidney disease with heart failure and with stage 5 chronic kidney disease, or end stage renal disease: Secondary | ICD-10-CM | POA: Diagnosis not present

## 2018-11-17 DIAGNOSIS — Z8349 Family history of other endocrine, nutritional and metabolic diseases: Secondary | ICD-10-CM | POA: Diagnosis not present

## 2018-11-17 DIAGNOSIS — I255 Ischemic cardiomyopathy: Secondary | ICD-10-CM | POA: Insufficient documentation

## 2018-11-17 DIAGNOSIS — N185 Chronic kidney disease, stage 5: Secondary | ICD-10-CM | POA: Insufficient documentation

## 2018-11-17 DIAGNOSIS — Z8249 Family history of ischemic heart disease and other diseases of the circulatory system: Secondary | ICD-10-CM | POA: Insufficient documentation

## 2018-11-17 DIAGNOSIS — I2510 Atherosclerotic heart disease of native coronary artery without angina pectoris: Secondary | ICD-10-CM | POA: Insufficient documentation

## 2018-11-17 DIAGNOSIS — Z955 Presence of coronary angioplasty implant and graft: Secondary | ICD-10-CM | POA: Insufficient documentation

## 2018-11-17 DIAGNOSIS — E785 Hyperlipidemia, unspecified: Secondary | ICD-10-CM | POA: Diagnosis not present

## 2018-11-17 DIAGNOSIS — F17210 Nicotine dependence, cigarettes, uncomplicated: Secondary | ICD-10-CM | POA: Insufficient documentation

## 2018-11-17 DIAGNOSIS — I5042 Chronic combined systolic (congestive) and diastolic (congestive) heart failure: Secondary | ICD-10-CM | POA: Insufficient documentation

## 2018-11-17 DIAGNOSIS — I739 Peripheral vascular disease, unspecified: Secondary | ICD-10-CM | POA: Diagnosis not present

## 2018-11-17 DIAGNOSIS — Z90710 Acquired absence of both cervix and uterus: Secondary | ICD-10-CM | POA: Diagnosis not present

## 2018-11-17 DIAGNOSIS — I701 Atherosclerosis of renal artery: Secondary | ICD-10-CM | POA: Insufficient documentation

## 2018-11-17 DIAGNOSIS — M109 Gout, unspecified: Secondary | ICD-10-CM | POA: Insufficient documentation

## 2018-11-17 HISTORY — PX: RENAL INTERVENTION: CATH118261

## 2018-11-17 SURGERY — RENAL INTERVENTION
Anesthesia: Moderate Sedation

## 2018-11-17 MED ORDER — CLOPIDOGREL BISULFATE 75 MG PO TABS
ORAL_TABLET | ORAL | Status: AC
  Administered 2018-11-17: 12:00:00 300 mg via ORAL
  Filled 2018-11-17: qty 4

## 2018-11-17 MED ORDER — MIDAZOLAM HCL 2 MG/2ML IJ SOLN
INTRAMUSCULAR | Status: DC | PRN
  Administered 2018-11-17: 2 mg via INTRAVENOUS

## 2018-11-17 MED ORDER — MIDAZOLAM HCL 5 MG/5ML IJ SOLN
INTRAMUSCULAR | Status: AC
  Filled 2018-11-17: qty 5

## 2018-11-17 MED ORDER — SODIUM CHLORIDE 0.9 % IV SOLN: INTRAVENOUS | Status: DC

## 2018-11-17 MED ORDER — ONDANSETRON HCL 4 MG/2ML IJ SOLN: 4.0000 mg | Freq: Four times a day (QID) | INTRAMUSCULAR | Status: DC | PRN

## 2018-11-17 MED ORDER — DIPHENHYDRAMINE HCL 50 MG/ML IJ SOLN: 50.0000 mg | Freq: Once | INTRAMUSCULAR | Status: DC | PRN

## 2018-11-17 MED ORDER — DIPHENHYDRAMINE HCL 50 MG/ML IJ SOLN
INTRAMUSCULAR | Status: DC | PRN
  Administered 2018-11-17: 25 mg via INTRAVENOUS

## 2018-11-17 MED ORDER — OXYCODONE HCL 5 MG PO TABS: 5.0000 mg | ORAL_TABLET | ORAL | Status: DC | PRN

## 2018-11-17 MED ORDER — CLOPIDOGREL BISULFATE 300 MG PO TABS
300.0000 mg | ORAL_TABLET | ORAL | Status: AC
  Administered 2018-11-17: 12:00:00 300 mg via ORAL

## 2018-11-17 MED ORDER — HYDROMORPHONE HCL 1 MG/ML IJ SOLN: 1.0000 mg | Freq: Once | INTRAMUSCULAR | Status: DC | PRN

## 2018-11-17 MED ORDER — MIDAZOLAM HCL 2 MG/ML PO SYRP: 8.0000 mg | ORAL_SOLUTION | Freq: Once | ORAL | Status: DC | PRN

## 2018-11-17 MED ORDER — SODIUM CHLORIDE 0.9 % IV SOLN: 250.0000 mL | INTRAVENOUS | Status: DC | PRN

## 2018-11-17 MED ORDER — FAMOTIDINE 20 MG PO TABS: 40.0000 mg | ORAL_TABLET | Freq: Once | ORAL | Status: DC | PRN

## 2018-11-17 MED ORDER — MORPHINE SULFATE (PF) 4 MG/ML IV SOLN: 2.0000 mg | INTRAVENOUS | Status: DC | PRN

## 2018-11-17 MED ORDER — CEFAZOLIN SODIUM-DEXTROSE 2-4 GM/100ML-% IV SOLN
INTRAVENOUS | Status: AC
  Administered 2018-11-17: 10:00:00 2 g via INTRAVENOUS
  Filled 2018-11-17: qty 100

## 2018-11-17 MED ORDER — FENTANYL CITRATE (PF) 100 MCG/2ML IJ SOLN
INTRAMUSCULAR | Status: DC | PRN
  Administered 2018-11-17 (×2): 50 ug via INTRAVENOUS
  Administered 2018-11-17: 25 ug via INTRAVENOUS

## 2018-11-17 MED ORDER — FENTANYL CITRATE (PF) 100 MCG/2ML IJ SOLN
INTRAMUSCULAR | Status: AC
  Filled 2018-11-17: qty 2

## 2018-11-17 MED ORDER — METHYLPREDNISOLONE SODIUM SUCC 125 MG IJ SOLR: 125.0000 mg | Freq: Once | INTRAMUSCULAR | Status: DC | PRN

## 2018-11-17 MED ORDER — HEPARIN SODIUM (PORCINE) 1000 UNIT/ML IJ SOLN
INTRAMUSCULAR | Status: DC | PRN
  Administered 2018-11-17: 5000 [IU] via INTRAVENOUS

## 2018-11-17 MED ORDER — DIPHENHYDRAMINE HCL 50 MG/ML IJ SOLN
INTRAMUSCULAR | Status: AC
  Filled 2018-11-17: qty 1

## 2018-11-17 MED ORDER — ATORVASTATIN CALCIUM 40 MG PO TABS: 40.0000 mg | ORAL_TABLET | Freq: Every day | ORAL | 3 refills | Status: DC

## 2018-11-17 MED ORDER — ACETAMINOPHEN 325 MG PO TABS: 650.0000 mg | ORAL_TABLET | ORAL | Status: DC | PRN

## 2018-11-17 MED ORDER — SODIUM CHLORIDE 0.9% FLUSH: 3.0000 mL | INTRAVENOUS | Status: DC | PRN

## 2018-11-17 MED ORDER — IODIXANOL 320 MG/ML IV SOLN
INTRAVENOUS | Status: DC | PRN
  Administered 2018-11-17: 85 mL via INTRA_ARTERIAL

## 2018-11-17 MED ORDER — CEFAZOLIN SODIUM-DEXTROSE 2-4 GM/100ML-% IV SOLN
2.0000 g | Freq: Once | INTRAVENOUS | Status: AC
  Administered 2018-11-17: 10:00:00 2 g via INTRAVENOUS

## 2018-11-17 MED ORDER — SODIUM CHLORIDE 0.9% FLUSH: 3.0000 mL | Freq: Two times a day (BID) | INTRAVENOUS | Status: DC

## 2018-11-17 MED ORDER — HYDRALAZINE HCL 20 MG/ML IJ SOLN: 5.0000 mg | INTRAMUSCULAR | Status: DC | PRN

## 2018-11-17 MED ORDER — HEPARIN SODIUM (PORCINE) 1000 UNIT/ML IJ SOLN
INTRAMUSCULAR | Status: AC
  Filled 2018-11-17: qty 1

## 2018-11-17 MED ORDER — SODIUM CHLORIDE 0.9 % IV SOLN
INTRAVENOUS | Status: DC
  Administered 2018-11-17: 10:00:00 via INTRAVENOUS

## 2018-11-17 MED ORDER — LABETALOL HCL 5 MG/ML IV SOLN: 10.0000 mg | INTRAVENOUS | Status: DC | PRN

## 2018-11-17 SURGICAL SUPPLY — 21 items
BALLN MUSTANG 7.0X20 75 (BALLOONS) ×2
BALLN MUSTANG 7.0X20 75CM (BALLOONS) ×1
BALLOON MUSTANG 7.0X20 75 (BALLOONS) ×1 IMPLANT
CATH PIG 70CM (CATHETERS) ×3 IMPLANT
CATH VS15FR (CATHETERS) ×3 IMPLANT
COVER PROBE U/S 5X48 (MISCELLANEOUS) ×3 IMPLANT
DEVICE PRESTO INFLATION (MISCELLANEOUS) ×3 IMPLANT
DEVICE STARCLOSE SE CLOSURE (Vascular Products) ×3 IMPLANT
DEVICE TORQUE .025-.038 (MISCELLANEOUS) ×3 IMPLANT
GLIDEWIRE STIFF .35X180X3 HYDR (WIRE) ×3 IMPLANT
NEEDLE ENTRY 21GA 7CM ECHOTIP (NEEDLE) ×3 IMPLANT
PACK ANGIOGRAPHY (CUSTOM PROCEDURE TRAY) ×3 IMPLANT
SET INTRO CAPELLA COAXIAL (SET/KITS/TRAYS/PACK) ×3 IMPLANT
SHEATH BRITE TIP 5FRX11 (SHEATH) ×3 IMPLANT
SHEATH DESTIN RDC 6FR 45 (SHEATH) ×3 IMPLANT
STENT LIFESTREAM 6X16X80 (Permanent Stent) ×3 IMPLANT
SYR MEDRAD MARK 7 150ML (SYRINGE) ×3 IMPLANT
TOWEL OR 17X26 4PK STRL BLUE (TOWEL DISPOSABLE) ×3 IMPLANT
TUBING CONTRAST HIGH PRESS 72 (TUBING) ×3 IMPLANT
WIRE J 3MM .035X145CM (WIRE) ×3 IMPLANT
WIRE MAGIC TOR.035 180C (WIRE) ×3 IMPLANT

## 2018-11-17 NOTE — Progress Notes (Signed)
Talked with [name] May, patient's transport to provide discharge instructions and estimated discharge time of 1300. [name] stated understanding and no further questions at this time. Informed [name] that I will call him should the patient's recovery time need to be extended beyond 1300.

## 2018-11-17 NOTE — Progress Notes (Signed)
Called Angelica Herrera, patient's family friend and transport to discuss extending discharge time to 1400. Patient's vascular site is trickling a very small amount of blood. Manual pressure held for 15 minutes and dressing reapplied. Patient to remain flat for 30 minutes continue monitoring. Informed Angelica that I will update him if the time of pick up will change based on patient's condition.

## 2018-11-17 NOTE — Discharge Instructions (Signed)
Moderate Conscious Sedation, Adult, Care After These instructions provide you with information about caring for yourself after your procedure. Your health care provider may also give you more specific instructions. Your treatment has been planned according to current medical practices, but problems sometimes occur. Call your health care provider if you have any problems or questions after your procedure. What can I expect after the procedure? After your procedure, it is common:  To feel sleepy for several hours.  To feel clumsy and have poor balance for several hours.  To have poor judgment for several hours.  To vomit if you eat too soon. Follow these instructions at home: For at least 24 hours after the procedure:   Do not: ? Participate in activities where you could fall or become injured. ? Drive. ? Use heavy machinery. ? Drink alcohol. ? Take sleeping pills or medicines that cause drowsiness. ? Make important decisions or sign legal documents. ? Take care of children on your own.  Rest. Eating and drinking  Follow the diet recommended by your health care provider.  If you vomit: ? Drink water, juice, or soup when you can drink without vomiting. ? Make sure you have little or no nausea before eating solid foods. General instructions  Have a responsible adult stay with you until you are awake and alert.  Take over-the-counter and prescription medicines only as told by your health care provider.  If you smoke, do not smoke without supervision.  Keep all follow-up visits as told by your health care provider. This is important. Contact a health care provider if:  You keep feeling nauseous or you keep vomiting.  You feel light-headed.  You develop a rash.  You have a fever. Get help right away if:  You have trouble breathing. This information is not intended to replace advice given to you by your health care provider. Make sure you discuss any questions you have  with your health care provider. Document Released: 01/20/2013 Document Revised: 03/14/2017 Document Reviewed: 07/22/2015 Elsevier Patient Education  2020 [place]. Femoral Site Care This sheet gives you information about how to care for yourself after your procedure. Your health care provider may also give you more specific instructions. If you have problems or questions, contact your health care provider. What can I expect after the procedure? After the procedure, it is common to have:  Bruising that usually fades within 1-2 weeks.  Tenderness at the site. Follow these instructions at home: Wound care  Follow instructions from your health care provider about how to take care of your insertion site. Make sure you: ? Wash your hands with soap and water before you change your bandage (dressing). If soap and water are not available, use hand sanitizer. ? Change your dressing as told by your health care provider. ? Leave stitches (sutures), skin glue, or adhesive strips in place. These skin closures may need to stay in place for 2 weeks or longer. If adhesive strip edges start to loosen and curl up, you may trim the loose edges. Do not remove adhesive strips completely unless your health care provider tells you to do that.  Do not take baths, swim, or use a hot tub until your health care provider approves.  You may shower 24-48 hours after the procedure or as told by your health care provider. ? Gently wash the site with plain soap and water. ? Pat the area dry with a clean towel. ? Do not rub the site. This may cause bleeding.  Do not apply powder or lotion to the site. Keep the site clean and dry.  Check your femoral site every day for signs of infection. Check for: ? Redness, swelling, or pain. ? Fluid or blood. ? Warmth. ? Pus or a bad smell. Activity  For the first 2-3 days after your procedure, or as long as directed: ? Avoid climbing stairs as much as possible. ? Do not  squat.  Do not lift anything that is heavier than 10 lb (4.5 kg), or the limit that you are told, until your health care provider says that it is safe.  Rest as directed. ? Avoid sitting for a long time without moving. Get up to take short walks every 1-2 hours.  Do not drive for 24 hours if you were given a medicine to help you relax (sedative). General instructions  Take over-the-counter and prescription medicines only as told by your health care provider.  Keep all follow-up visits as told by your health care provider. This is important. Contact a health care provider if you have:  A fever or chills.  You have redness, swelling, or pain around your insertion site. Get help right away if:  The catheter insertion area swells very fast.  You pass out.  You suddenly start to sweat or your skin gets clammy.  The catheter insertion area is bleeding, and the bleeding does not stop when you hold steady pressure on the area.  The area near or just beyond the catheter insertion site becomes pale, cool, tingly, or numb. These symptoms may represent a serious problem that is an emergency. Do not wait to see if the symptoms will go away. Get medical help right away. Call your local emergency services (911 in the [place] Do not drive yourself to the hospital. Summary  After the procedure, it is common to have bruising that usually fades within 1-2 weeks.  Check your femoral site every day for signs of infection.  Do not lift anything that is heavier than 10 lb (4.5 kg), or the limit that you are told, until your health care provider says that it is safe. This information is not intended to replace advice given to you by your health care provider. Make sure you discuss any questions you have with your health care provider. Document Released: 12/03/2013 Document Revised: 04/14/2017 Document Reviewed: 04/14/2017 Elsevier Patient Education  2020 [place] After This  sheet gives you information about how to care for yourself after your procedure. Your doctor may also give you more specific instructions. If you have problems or questions, contact your doctor. Follow these instructions at home: Insertion site care  Follow instructions from your doctor about how to take care of your long, thin tube (catheter) insertion area. Make sure you: ? Wash your hands with soap and water before you change your bandage (dressing). If you cannot use soap and water, use hand sanitizer. ? Change your bandage as told by your doctor. ? Leave stitches (sutures), skin glue, or skin tape (adhesive) strips in place. They may need to stay in place for 2 weeks or longer. If tape strips get loose and curl up, you may trim the loose edges. Do not remove tape strips completely unless your doctor says it is okay.  Do not take baths, swim, or use a hot tub until your doctor says it is okay.  You may shower 24-48 hours after the procedure or as told by your doctor. ? Gently wash the area with  plain soap and water. ? Pat the area dry with a clean towel. ? Do not rub the area. This may cause bleeding.  Do not apply powder or lotion to the area. Keep the area clean and dry.  Check your insertion area every day for signs of infection. Check for: ? More redness, swelling, or pain. ? Fluid or blood. ? Warmth. ? Pus or a bad smell. Activity  Rest as told by your doctor, usually for 1-2 days.  Do not lift anything that is heavier than 10 lbs. (4.5 kg) or as told by your doctor.  Do not drive for 24 hours if you were given a medicine to help you relax (sedative).  Do not drive or use heavy machinery while taking prescription pain medicine. General instructions   Go back to your normal activities as told by your doctor, usually in about a week. Ask your doctor what activities are safe for you.  If the insertion area starts to bleed, lie flat and put pressure on the area. If the  bleeding does not stop, get help right away. This is an emergency.  Drink enough fluid to keep your pee (urine) clear or pale yellow.  Take over-the-counter and prescription medicines only as told by your doctor.  Keep all follow-up visits as told by your doctor. This is important. Contact a doctor if:  You have a fever.  You have chills.  You have more redness, swelling, or pain around your insertion area.  You have fluid or blood coming from your insertion area.  The insertion area feels warm to the touch.  You have pus or a bad smell coming from your insertion area.  You have more bruising around the insertion area.  Blood collects in the tissue around the insertion area (hematoma) that may be painful to the touch. Get help right away if:  You have a lot of pain in the insertion area.  The insertion area swells very fast.  The insertion area is bleeding, and the bleeding does not stop after holding steady pressure on the area.  The area near or just beyond the insertion area becomes pale, cool, tingly, or numb. These symptoms may be an emergency. Do not wait to see if the symptoms will go away. Get medical help right away. Call your local emergency services (911 in the [place] Do not drive yourself to the hospital. Summary  After the procedure, it is common to have bruising and tenderness at the long, thin tube insertion area.  After the procedure, it is important to rest and drink plenty of fluids.  Do not take baths, swim, or use a hot tub until your doctor says it is okay to do so. You may shower 24-48 hours after the procedure or as told by your doctor.  If the insertion area starts to bleed, lie flat and put pressure on the area. If the bleeding does not stop, get help right away. This is an emergency. This information is not intended to replace advice given to you by your health care provider. Make sure you discuss any questions you have with your health care  provider. Document Released: 06/28/2008 Document Revised: 03/14/2017 Document Reviewed: 03/26/2016 Elsevier Patient Education  2020 [employer].

## 2018-11-17 NOTE — Op Note (Signed)
[facility] Percutaneous Study/Intervention Procedural Note    Surgeon(s): Nurse, children's: None  Pre-operative Diagnosis: Bilateral renal artery stenosis, malignant renovascular hypertension  Post-operative diagnosis:Left renal artery stenosis greater than 90%, no hemodynamically significant right renal artery stenosis, malignant renovascular hypertension  Procedure(s) Performed: 1. Ultrasound guidance for vascular access right common femoral artery 2. Catheter placement into left and right renal artery from right femoral approach 3. Aortogram and selective left and right renal angiogram 4. Balloon expandable stent placement to 7 mm with a 6 mm diameter x 16 mm length lifestream stent 5. StarClose closure device right femoral artery  Contrast: 85 cc  EBL: 25 cc  Fluoro Time: 6.0 minutes  Moderate conscious sedation: Approximately 40 minutes with 2 mg of Versed and 125 Mcg of Fentanyl  Indications: The patient is a 48 year old woman who was admitted to [facility] with a malignant hypertensive crisis several days ago.  CT scan suggested bilateral high-grade renal artery stenosis. The patient has suboptimal blood pressure control despite multiple antihypertensives and a noninvasive study demonstrating hemodynamically significant bilateral renal artery stenosis. Given the clinical scenario and the noninvasive findings, angiogram is indicated for further evaluation of her renal artery and potential treatment. Risks and benefits are discussed and informed consent is obtained.  Procedure: The patient was identified and appropriate procedural time out was performed. The patient was then placed supine on the table and prepped and draped in the usual sterile fashion.Moderate conscious sedation was administered with a face to face encounter with the  patient throughout the procedure with my supervision of the RN administering medicines and monitoring the patients vital signs and mental status throughout from the start of the procedure until the patient was taken to the recovery room  Ultrasound was used to evaluate the right common femoral artery. It was patent . A digital ultrasound image was acquired. A micropuncture needle was used to access the right common femoral artery under direct ultrasound guidance and a permanent image was performed. Microwire followed by micro-sheath was then inserted without difficulty.  A 0.035 J wire was advanced without resistance and a 5Fr sheath was placed. Pigtail catheter was placed into the aorta at the L1 level and an AP aortogram was performed. This demonstrated greater than 90% stenosis of the left renal artery at its origin.  The right renal artery was poorly visualized as it has an anterior takeoff. The patient was then systemically heparinized with 5000 units of intravenous heparin. I used a V S1 catheter to cannulate the left renal artery and selective imaging was performed. This confirmed a greater than 90% stenosis of the left renal artery.  At this point I selected the Magic torque glide wire and crossed the lesion without difficulty.  Magnified imaging was then obtained.   I then selected a 6 mm mm diameter x 16 mm length balloon expandable lifestream stent and brought this across the lesion.  This was deployed encompassing the lesion with its proximal extent going back into the aorta for a mm or two.  This was inflated to 12 ATM and the waist resolved.  Completion angiogram showed moderate residual narrowing at the origin and a 7 mm x 20 mm ultra versed balloon was advanced across this portion of the stent and inflated to 6 atm..  Follow-up imaging demonstrated less than 5% residual stenosis with wide patency of the renal artery and filling of the entire kidney.  The sheath was then pulled more  distally  and the wire negotiated into the descending thoracic aorta.  V S1 catheter was reintroduced and the V S1 was used to cannulate the right renal artery.  LAO projection of the right renal artery was then obtained with hand-injection.  This demonstrated less than 30% ostial stenosis in the right renal artery.  No evidence for hemodynamically significant right renal artery restenosis.   The guide catheter was removed. Oblique arteriogram was performed of the right femoral artery and StarClose closure device was deployed in the usual fashion with excellent hemostatic result. The patient was taken to the recovery room in stable condition having tolerated the procedure well.  Findings:  Aortogram/Renal Arteries:Initial imaging demonstrated greater than 90% stenosis of the left renal artery.  This was then selected and a 6 mm lifestream stent was deployed with a 7 mm postdilatation at the ostia.  This yielded a less than 5% residual stenosis.  The right renal artery was then selected and imaging in an oblique view demonstrated mild atherosclerotic changes no hemodynamically significant stenosis.  No intervention is required.   Condition:  Stable  Complications: None   Angelica Herrera 11/17/2018 10:55 AM  This note was created with Dragon Medical transcription system. Any errors in dictation are purely unintentional.

## 2018-11-17 NOTE — H&P (Signed)
[facility] History & Physical Update  The patient was interviewed and re-examined.  The patient's previous History and Physical has been reviewed and is unchanged.  There is no change in the plan of care. We plan to proceed with the scheduled procedure.  Hortencia Pilar, MD  11/17/2018, 9:32 AM

## 2018-12-02 ENCOUNTER — Other Ambulatory Visit (INDEPENDENT_AMBULATORY_CARE_PROVIDER_SITE_OTHER): Payer: Self-pay | Admitting: Vascular Surgery

## 2018-12-02 DIAGNOSIS — Z9889 Other specified postprocedural states: Secondary | ICD-10-CM

## 2018-12-07 ENCOUNTER — Encounter (INDEPENDENT_AMBULATORY_CARE_PROVIDER_SITE_OTHER): Payer: Medicaid Other

## 2018-12-07 ENCOUNTER — Ambulatory Visit (INDEPENDENT_AMBULATORY_CARE_PROVIDER_SITE_OTHER): Payer: Medicaid Other | Admitting: Vascular Surgery

## 2019-01-14 ENCOUNTER — Other Ambulatory Visit: Payer: Self-pay

## 2019-01-14 ENCOUNTER — Emergency Department
Admission: EM | Admit: 2019-01-14 | Discharge: 2019-01-14 | Disposition: A | Discharge status: Home / Self Care | Payer: Medicaid Other | Attending: Emergency Medicine | Admitting: Emergency Medicine

## 2019-01-14 DIAGNOSIS — I5042 Chronic combined systolic (congestive) and diastolic (congestive) heart failure: Secondary | ICD-10-CM | POA: Insufficient documentation

## 2019-01-14 DIAGNOSIS — Z79899 Other long term (current) drug therapy: Secondary | ICD-10-CM | POA: Diagnosis not present

## 2019-01-14 DIAGNOSIS — Z7982 Long term (current) use of aspirin: Secondary | ICD-10-CM | POA: Diagnosis not present

## 2019-01-14 DIAGNOSIS — R079 Chest pain, unspecified: Secondary | ICD-10-CM | POA: Diagnosis present

## 2019-01-14 DIAGNOSIS — R1013 Epigastric pain: Secondary | ICD-10-CM | POA: Insufficient documentation

## 2019-01-14 DIAGNOSIS — F17210 Nicotine dependence, cigarettes, uncomplicated: Secondary | ICD-10-CM | POA: Diagnosis not present

## 2019-01-14 DIAGNOSIS — N184 Chronic kidney disease, stage 4 (severe): Secondary | ICD-10-CM | POA: Diagnosis not present

## 2019-01-14 DIAGNOSIS — E039 Hypothyroidism, unspecified: Secondary | ICD-10-CM | POA: Insufficient documentation

## 2019-01-14 DIAGNOSIS — I130 Hypertensive heart and chronic kidney disease with heart failure and stage 1 through stage 4 chronic kidney disease, or unspecified chronic kidney disease: Secondary | ICD-10-CM | POA: Insufficient documentation

## 2019-01-14 LAB — CBC WITH DIFFERENTIAL/PLATELET
Abs Immature Granulocytes: 0.03 10*3/uL (ref 0.00–0.07)
Basophils Absolute: 0.1 10*3/uL (ref 0.0–0.1)
Basophils Relative: 1 %
Eosinophils Absolute: 0.1 10*3/uL (ref 0.0–0.5)
Eosinophils Relative: 1 %
HCT: 48.3 % — ABNORMAL HIGH (ref 36.0–46.0)
Hemoglobin: 16.1 g/dL — ABNORMAL HIGH (ref 12.0–15.0)
Immature Granulocytes: 0 %
Lymphocytes Relative: 18 %
Lymphs Abs: 1.5 10*3/uL (ref 0.7–4.0)
MCH: 28.9 pg (ref 26.0–34.0)
MCHC: 33.3 g/dL (ref 30.0–36.0)
MCV: 86.7 fL (ref 80.0–100.0)
Monocytes Absolute: 0.4 10*3/uL (ref 0.1–1.0)
Monocytes Relative: 4 %
Neutro Abs: 6.7 10*3/uL (ref 1.7–7.7)
Neutrophils Relative %: 76 %
Platelets: 325 10*3/uL (ref 150–400)
RBC: 5.57 MIL/uL — ABNORMAL HIGH (ref 3.87–5.11)
RDW: 15.5 % (ref 11.5–15.5)
WBC: 8.8 10*3/uL (ref 4.0–10.5)
nRBC: 0 % (ref 0.0–0.2)

## 2019-01-14 LAB — COMPREHENSIVE METABOLIC PANEL
ALT: 14 U/L (ref 0–44)
AST: 24 U/L (ref 15–41)
Albumin: 4.5 g/dL (ref 3.5–5.0)
Alkaline Phosphatase: 101 U/L (ref 38–126)
Anion gap: 17 — ABNORMAL HIGH (ref 5–15)
BUN: 12 mg/dL (ref 6–20)
CO2: 22 mmol/L (ref 22–32)
Calcium: 10 mg/dL (ref 8.9–10.3)
Chloride: 103 mmol/L (ref 98–111)
Creatinine, Ser: 1.42 mg/dL — ABNORMAL HIGH (ref 0.44–1.00)
GFR calc Af Amer: 50 mL/min — ABNORMAL LOW (ref >60)
GFR calc non Af Amer: 43 mL/min — ABNORMAL LOW (ref >60)
Glucose, Bld: 136 mg/dL — ABNORMAL HIGH (ref 70–99)
Potassium: 3.1 mmol/L — ABNORMAL LOW (ref 3.5–5.1)
Sodium: 142 mmol/L (ref 135–145)
Total Bilirubin: 0.8 mg/dL (ref 0.3–1.2)
Total Protein: 8.4 g/dL — ABNORMAL HIGH (ref 6.5–8.1)

## 2019-01-14 LAB — LIPASE, BLOOD: Lipase: 56 U/L — ABNORMAL HIGH (ref 11–51)

## 2019-01-14 LAB — TROPONIN I (HIGH SENSITIVITY): Troponin I (High Sensitivity): 57 ng/L — ABNORMAL HIGH (ref <18)

## 2019-01-14 MED ORDER — MORPHINE SULFATE (PF) 4 MG/ML IV SOLN
4.0000 mg | Freq: Once | INTRAVENOUS | Status: AC
  Administered 2019-01-14: 4 mg via INTRAVENOUS
  Filled 2019-01-14: qty 1

## 2019-01-14 MED ORDER — LORAZEPAM 2 MG/ML IJ SOLN
1.0000 mg | Freq: Once | INTRAMUSCULAR | Status: AC
  Administered 2019-01-14: 1 mg via INTRAVENOUS
  Filled 2019-01-14: qty 1

## 2019-01-14 MED ORDER — ONDANSETRON HCL 4 MG/2ML IJ SOLN
4.0000 mg | Freq: Once | INTRAMUSCULAR | Status: AC
  Administered 2019-01-14: 4 mg via INTRAVENOUS
  Filled 2019-01-14: qty 2

## 2019-01-14 NOTE — ED Provider Notes (Signed)
[facility] Emergency Department Provider Note   ____________________________________________    I have reviewed the triage vital signs and the nursing notes.   HISTORY  Chief Complaint Epigastric pain    HPI Angelica Herrera is a 48 y.o. female with a history of CAD, CHF, CKD, renal artery stenosis, recent stent placement, pancreatitis who presents today with complaints of epigastric pain which she describes as consistent with prior episodes of pancreatitis.  She reports it started this morning, she was feeling well yesterday.  She describes it as a sharp burning pain in her epigastrium.  No radiation of pain.  No chest pain or shortness of breath.  Has not take anything for this.  No fevers or chills.  Past Medical History:  Diagnosis Date  . Arthritis    gout  . CAD (coronary artery disease)    a. 02/2012 s/p anterolateral STEMI/Cath/PCI: LM 100 (3.0x15 DES ), LAD sm, severely diseased (PTCA w/ 1.70mm balloon), LCX nl, RI moderate size, nl, RCA 30-93m, AM/PDA/PLA nl, R->L collats noted; b. cath 08/03/15: widely patent ostLM stent at 10%, mLAD 60%,  mRCA 30%, EF 35-40%, ant wall HK, mildly elevated LVEDP; c. 02/2017 MV: ant infarct, mild peri-infarct isch (similar to 2017 study)->Med Rx.  . Chronic combined systolic (congestive) and diastolic (congestive) heart failure ([place])    a. 02/2017 Echo: EF 30-35%, diff HK, sev HK/poss AK of ant and lat walls. Gr1 DD. Mild to mod MR. Nl RV fxn. Mod TR.  . CKD (chronic kidney disease), stage IV ([place])   . HTN (hypertension)   . Hyperlipidemia   . Hypothyroidism   . Ischemic cardiomyopathy    a. 02/2012 Echo: EF 40-45%, ant/lat HK, Gr1 DD, Mild MR, PASP 97mmHg; b. 02/2017 Echo: EF 30-35%.  . NSVT (nonsustained ventricular tachycardia) ([place])    a. 02/2012 post-MI  . Obesity   . Pancreatitis   . S/P primary angioplasty with coronary stent 03/04/2018   Dual antiplatelet therapy INDEFINITELY per cardiology note  Nov 2019  . Tobacco abuse     Patient Active Problem List   Diagnosis Date Noted  . Accelerated hypertension 11/13/2018  . Chest pain 11/12/2018  . S/P primary angioplasty with coronary stent 03/04/2018  . Depression, recurrent ([place]) 12/02/2017  . Chronic systolic heart failure ([place]) 11/21/2017  . Controlled substance agreement signed 09/07/2015  . Coronary artery disease involving native coronary artery   . Allergic rhinitis due to pollen 06/28/2015  . GAD (generalized anxiety disorder) 03/17/2015  . Degenerative disc disease, cervical 03/17/2015  . Hyperlipidemia   . Obesity   . CKD (chronic kidney disease), stage IV ([place])   . Ischemic cardiomyopathy   . NSVT (nonsustained ventricular tachycardia) ([place])   . Hypothyroidism   . History of acute anterior wall MI 02/12/2012  . Essential hypertension, malignant 02/12/2012  . Tobacco abuse 02/12/2012    Past Surgical History:  Procedure Laterality Date  . ABDOMINAL HYSTERECTOMY    . CARDIAC CATHETERIZATION  2013   [facility] s/p stent  . CARDIAC CATHETERIZATION Left 08/03/2015   Procedure: Left Heart Cath and Coronary Angiography;  Surgeon: Wellington Hampshire, MD;  Location: [place];  Service: Cardiovascular;  Laterality: Left;  . DILATION AND CURETTAGE OF UTERUS    . LEFT HEART CATHETERIZATION WITH CORONARY ANGIOGRAM N/A 02/11/2012   Procedure: LEFT HEART CATHETERIZATION WITH CORONARY ANGIOGRAM;  Surgeon: [name], MD;  Location: [facility] CATH LAB;  Service: Cardiovascular;  Laterality: N/A;  . RENAL INTERVENTION  N/A 11/17/2018   Procedure: RENAL INTERVENTION;  Surgeon: Katha Cabal, MD;  Location: [place];  Service: Cardiovascular;  Laterality: N/A;    Prior to Admission medications   Medication Sig Start Date End Date Taking? Authorizing Provider  ALLERGY RELIEF 10 MG tablet TAKE ONE TABLET BY MOUTH DAILY. 10/15/18   Poulose, Bethel Born, NP  aspirin 81 MG tablet Take 1 tablet (81 mg total) by mouth  daily. 02/15/12   Theora Gianotti, NP  atorvastatin (LIPITOR) 40 MG tablet Take 1 tablet (40 mg total) by mouth at bedtime. 11/17/18   Schnier, Dolores Lory, MD  carvedilol (COREG) 6.25 MG tablet Take 1 tablet (6.25 mg total) by mouth 2 (two) times daily with a meal. 11/14/18   Vaughan Basta, MD  clopidogrel (PLAVIX) 75 MG tablet TAKE (1) TABLET BY MOUTH EVERY DAY 08/28/18   Wellington Hampshire, MD  Colchicine (MITIGARE) 0.6 MG CAPS Two tablets by mouth once, then one pill one hour later; then one pill per day until flare resolves. No atorvastatin during medication 02/22/18   Laban Emperor, PA-C  furosemide (LASIX) 20 MG tablet Take 1 tablet (20 mg total) by mouth daily. 02/27/18   [name], PA-C  gabapentin (NEURONTIN) 300 MG capsule Take 1 capsule (300 mg total) by mouth 2 (two) times daily. 07/08/18   Poulose, Bethel Born, NP  hydrALAZINE (APRESOLINE) 25 MG tablet Take 1 tablet (25 mg total) by mouth every 8 (eight) hours. 11/14/18   Vaughan Basta, MD  levothyroxine (SYNTHROID, LEVOTHROID) 112 MCG tablet Take 1 tablet (112 mcg total) by mouth daily before breakfast. 07/08/18   Poulose, Bethel Born, NP  nitroGLYCERIN (NITROSTAT) 0.4 MG SL tablet Place 1 tablet (0.4 mg total) under the tongue every 5 (five) minutes x 3 doses as needed for chest pain. 02/15/12   Theora Gianotti, NP  ondansetron (ZOFRAN) 4 MG tablet Take 1 tablet (4 mg total) by mouth every 6 (six) hours as needed for nausea. 11/14/18   Vaughan Basta, MD  oxyCODONE-acetaminophen (PERCOCET/ROXICET) 5-325 MG tablet Take 1 tablet by mouth every 6 (six) hours as needed for moderate pain. 11/14/18   Vaughan Basta, MD  pantoprazole (PROTONIX) 40 MG tablet Take 1 tablet (40 mg total) by mouth daily. 11/15/18   Vaughan Basta, MD  PARoxetine (PAXIL) 40 MG tablet Take 1 tablet (40 mg total) by mouth daily. 11/14/18   Vaughan Basta, MD     Allergies Patient has no known allergies.  Family  History  Problem Relation Age of Onset  . Hypertension Father   . Thyroid disease Father   . AAA (abdominal aortic aneurysm) Father   . Hypertension Mother   . Hyperlipidemia Mother   . Diabetes Mother   . Kidney disease Mother   . Hypertension Sister   . Hypertension Sister   . Supraventricular tachycardia Sister   . Hypertension Brother   . Hypertension Brother   . Hypertension Daughter   . Cancer Paternal Grandfather        lung    Social History Social History   Tobacco Use  . Smoking status: Current Every Day Smoker    Packs/day: 0.25    Years: 30.00    Pack years: 7.50    Types: Cigarettes  . Smokeless tobacco: Never Used  Substance Use Topics  . Alcohol use: Not Currently    Comment: Says she previously drank socially - never a heavy drinker.  No etoh since 08/2018.  Marland Kitchen Drug use: No  Types: Marijuana    Review of Systems  Constitutional: No fever/chills Eyes: No visual changes.  ENT: No sore throat. Cardiovascular: As above Respiratory: Denies shortness of breath. Gastrointestinal: As above Genitourinary: Negative for dysuria. Musculoskeletal: Negative for back pain. Skin: Negative for rash. Neurological: Negative for headaches or weakness   ____________________________________________   PHYSICAL EXAM:  VITAL SIGNS: ED Triage Vitals  Enc Vitals Group     BP 01/14/19 0908 (!) 227/149     Pulse Rate 01/14/19 0908 85     Resp 01/14/19 0908 18     Temp --      Temp src --      SpO2 01/14/19 0908 100 %     Weight 01/14/19 0859 77.1 kg (170 lb)     Height 01/14/19 0859 1.702 m (5\' 7" )     Head Circumference --      Peak Flow --      Pain Score 01/14/19 0859 10     Pain Loc --      Pain Edu? --      Excl. in [place]? --     Constitutional: Alert and oriented.  Doubled over in pain  Nose: No congestion/rhinnorhea. Mouth/Throat: Mucous membranes are moist.    Cardiovascular: Normal rate, regular rhythm. Grossly normal heart sounds.  Good  peripheral circulation. Respiratory: Normal respiratory effort.  No retractions. Lungs CTAB. Gastrointestinal: Mild tenderness in epigastrium. No distention.  No CVA tenderness.  Musculoskeletal: .  Warm and well perfused Neurologic:  Normal speech and language. No gross focal neurologic deficits are appreciated.  Skin:  Skin is warm, dry and intact. No rash noted. Psychiatric: Mood and affect are normal. Speech and behavior are normal.  ____________________________________________   LABS (all labs ordered are listed, but only abnormal results are displayed)  Labs Reviewed  LIPASE, BLOOD - Abnormal; Notable for the following components:      Result Value   Lipase 56 (*)    All other components within normal limits  CBC WITH DIFFERENTIAL/PLATELET - Abnormal; Notable for the following components:   RBC 5.57 (*)    Hemoglobin 16.1 (*)    HCT 48.3 (*)    All other components within normal limits  COMPREHENSIVE METABOLIC PANEL - Abnormal; Notable for the following components:   Potassium 3.1 (*)    Glucose, Bld 136 (*)    Creatinine, Ser 1.42 (*)    Total Protein 8.4 (*)    GFR calc non Af Amer 43 (*)    GFR calc Af Amer 50 (*)    Anion gap 17 (*)    All other components within normal limits  TROPONIN I (HIGH SENSITIVITY) - Abnormal; Notable for the following components:   Troponin I (High Sensitivity) 57 (*)    All other components within normal limits   ____________________________________________  EKG  ED ECG REPORT I, Lavonia Drafts, the attending physician, personally viewed and interpreted this ECG.  Date: 01/14/2019   Rhythm: Sinus rhythm QRS Axis: normal Intervals: Abnormal ST/T Wave abnormalities: Abnormal, nonspecific changes Narrative Interpretation: no evidence of acute ischemia  ____________________________________________  RADIOLOGY  None ____________________________________________   PROCEDURES  Procedure(s) performed: No  Procedures    Critical Care performed: No ____________________________________________   INITIAL IMPRESSION / ASSESSMENT AND PLAN / ED COURSE  Pertinent labs & imaging results that were available during my care of the patient were reviewed by me and considered in my medical decision making (see chart for details).  Patient with a history as described above  presents with epigastric pain.  Differential includes pancreatitis, CAD, gastritis, PUD.  We will give IV morphine, IV Zofran, IV fluids.  She is also requesting something for anxiety.  IV Ativan ordered.  Pending labs, chest x-ray.  Recent CTA of the chest abdomen pelvis discovered renal artery stenosis which led to her having a stent placed by Dr. Delana Meyer in August  After receiving medications patient reports she feels "100% better" and is anxious to go home.  I discussed with her the fact that her troponin level is elevated although she appears to have a chronically elevated troponin.  She adamantly denies any chest pain and does not think her symptoms are at all related to her heart today and she still would like to go home despite my recommending admission.  She states that she has any return of her pain she will return to the emergency department    ____________________________________________   FINAL CLINICAL IMPRESSION(S) / ED DIAGNOSES  Final diagnoses:  Epigastric pain        Note:  This document was prepared using Dragon voice recognition software and may include unintentional dictation errors.   Lavonia Drafts, MD 01/14/19 1252

## 2019-01-14 NOTE — ED Notes (Signed)
Pt placed on 3L [place] due to sats 80-88%. Dr Corky Downs notified.

## 2019-01-14 NOTE — ED Triage Notes (Signed)
Pt points to her substernal chest and states "my pancreas is hurting" states she has a hx of pancreatitis. Having N/V with it. States it woke her from sleep this morning.,

## 2019-01-28 ENCOUNTER — Encounter: Payer: Self-pay | Admitting: Physician Assistant

## 2019-01-28 ENCOUNTER — Telehealth: Payer: Self-pay | Admitting: Physician Assistant

## 2019-01-28 ENCOUNTER — Other Ambulatory Visit: Payer: Self-pay

## 2019-01-28 ENCOUNTER — Ambulatory Visit: Payer: Medicaid Other

## 2019-01-28 ENCOUNTER — Ambulatory Visit (INDEPENDENT_AMBULATORY_CARE_PROVIDER_SITE_OTHER): Payer: Medicaid Other | Admitting: Physician Assistant

## 2019-01-28 VITALS — BP 160/100 | HR 71 | Temp 97.3°F | Ht 67.0 in | Wt 174.0 lb

## 2019-01-28 DIAGNOSIS — I5022 Chronic systolic (congestive) heart failure: Secondary | ICD-10-CM | POA: Diagnosis not present

## 2019-01-28 DIAGNOSIS — Z955 Presence of coronary angioplasty implant and graft: Secondary | ICD-10-CM

## 2019-01-28 DIAGNOSIS — N184 Chronic kidney disease, stage 4 (severe): Secondary | ICD-10-CM

## 2019-01-28 DIAGNOSIS — R0600 Dyspnea, unspecified: Secondary | ICD-10-CM | POA: Diagnosis not present

## 2019-01-28 DIAGNOSIS — E785 Hyperlipidemia, unspecified: Secondary | ICD-10-CM

## 2019-01-28 DIAGNOSIS — I2510 Atherosclerotic heart disease of native coronary artery without angina pectoris: Secondary | ICD-10-CM

## 2019-01-28 DIAGNOSIS — E876 Hypokalemia: Secondary | ICD-10-CM

## 2019-01-28 DIAGNOSIS — I701 Atherosclerosis of renal artery: Secondary | ICD-10-CM

## 2019-01-28 DIAGNOSIS — I255 Ischemic cardiomyopathy: Secondary | ICD-10-CM

## 2019-01-28 DIAGNOSIS — E038 Other specified hypothyroidism: Secondary | ICD-10-CM

## 2019-01-28 DIAGNOSIS — Z720 Tobacco use: Secondary | ICD-10-CM

## 2019-01-28 DIAGNOSIS — Z8679 Personal history of other diseases of the circulatory system: Secondary | ICD-10-CM

## 2019-01-28 DIAGNOSIS — I10 Essential (primary) hypertension: Secondary | ICD-10-CM

## 2019-01-28 DIAGNOSIS — R918 Other nonspecific abnormal finding of lung field: Secondary | ICD-10-CM

## 2019-01-28 DIAGNOSIS — R1013 Epigastric pain: Secondary | ICD-10-CM

## 2019-01-28 DIAGNOSIS — R002 Palpitations: Secondary | ICD-10-CM

## 2019-01-28 DIAGNOSIS — F1290 Cannabis use, unspecified, uncomplicated: Secondary | ICD-10-CM

## 2019-01-28 DIAGNOSIS — I340 Nonrheumatic mitral (valve) insufficiency: Secondary | ICD-10-CM

## 2019-01-28 MED ORDER — ATORVASTATIN CALCIUM 80 MG PO TABS: ORAL_TABLET | ORAL | 3 refills | Status: DC

## 2019-01-28 MED ORDER — CARVEDILOL 12.5 MG PO TABS: 12.5000 mg | ORAL_TABLET | Freq: Two times a day (BID) | ORAL | 3 refills | Status: DC

## 2019-01-28 NOTE — Progress Notes (Signed)
Office Visit    Patient Name: Angelica Herrera Date of Encounter: 01/28/2019  Primary Care Provider:  Arnetha Courser, MD Primary Cardiologist:  Kathlyn Sacramento, MD  Chief Complaint    48 year old female with history of CAD s/p anterior lateral ST elevation MI in 01/2012 s/p PCI/DES to left main as below, ischemic cardiomyopathy, poorly controlled HTN, RAS s/p L sided RAS stenting 11/17/2018, CKD stage IV, h/o NSVT, hyperlipidemia, hypothyroidism, current smoker (1/2 pk q3 days), pulmonary nodules by CTA, and obesity, and who presents for follow-up of her CAD.   Past Medical History    Past Medical History:  Diagnosis Date   Arthritis    gout   CAD (coronary artery disease)    a. 02/2012 s/p anterolateral STEMI/Cath/PCI: LM 100 (3.0x15 DES ), LAD sm, severely diseased (PTCA w/ 1.6mm balloon), LCX nl, RI moderate size, nl, RCA 30-56m, AM/PDA/PLA nl, R->L collats noted; b. cath 08/03/15: widely patent ostLM stent at 10%, mLAD 60%,  mRCA 30%, EF 35-40%, ant wall HK, mildly elevated LVEDP; c. 02/2017 MV: ant infarct, mild peri-infarct isch (similar to 2017 study)->Med Rx.   Chronic combined systolic (congestive) and diastolic (congestive) heart failure ([place])    a. 02/2017 Echo: EF 30-35%, diff HK, sev HK/poss AK of ant and lat walls. Gr1 DD. Mild to mod MR. Nl RV fxn. Mod TR.   CKD (chronic kidney disease), stage IV (H[facility])    HTN (hypertension)    Hyperlipidemia    Hypothyroidism    Ischemic cardiomyopathy    a. 02/2012 Echo: EF 40-45%, ant/lat HK, Gr1 DD, Mild MR, PASP 82mmHg; b. 02/2017 Echo: EF 30-35%.   NSVT (nonsustained ventricular tachycardia) ([place])    a. 02/2012 post-MI   Obesity    Pancreatitis    S/P primary angioplasty with coronary stent 03/04/2018   Dual antiplatelet therapy INDEFINITELY per cardiology note Nov 2019   Tobacco abuse    Past Surgical History:  Procedure Laterality Date   ABDOMINAL HYSTERECTOMY     CARDIAC CATHETERIZATION  2013   [facility]  s/p stent   CARDIAC CATHETERIZATION Left 08/03/2015   Procedure: Left Heart Cath and Coronary Angiography;  Surgeon: Wellington Hampshire, MD;  Location: [facility];  Service: Cardiovascular;  Laterality: Left;   DILATION AND CURETTAGE OF UTERUS     LEFT HEART CATHETERIZATION WITH CORONARY ANGIOGRAM N/A 02/11/2012   Procedure: LEFT HEART CATHETERIZATION WITH CORONARY ANGIOGRAM;  Surgeon: [name], MD;  Location: [facility] CATH LAB;  Service: Cardiovascular;  Laterality: N/A;   RENAL INTERVENTION N/A 11/17/2018   Procedure: RENAL INTERVENTION;  Surgeon: Katha Cabal, MD;  Location: [facility];  Service: Cardiovascular;  Laterality: N/A;    Allergies  No Known Allergies  History of Present Illness    48 year old female with PMH as above.  She was admitted 01/2012 with anterolateral STEMI.  Emergent cardiac catheterization showed occlusion of the left main coronary artery with successful PCI/DES and PTCA to the LAD 2/2 embolization from the left main.  Postprocedure, she had hemodynamic instability with VT and hypotension.  Echo showed EF 45% without significant MR.  She was lost to follow-up in 2013 and reestablished in 2017, at which time she continued to smoke and was off of her medications.  In 2017, she had atypical chest pain with subsequent stress test showing prior anterior infarct with mild periinfarct ischemia and EF 33%.  Repeat cardiac cath which showed patent left main stent with moderate mLAD stenosis and an occluded  D2 with faint collaterals.  The RCA was very large with mild mid stenosis.  EF 35 -40% with anterior wall hypokinesis.  Medical management was advised.  She had repeat atypical CP in 2018 with repeat nuclear stress test showing similar findings as the previous.  She was seen 02/2017 with improved atypical chest pain but noted generalized fatigue, anxiety, and orthostatic dizziness.  She continued to smoke. In 04/2017, she was seen in the ED with gastritis  and head trauma (with CT head negative) in the setting of increased alcohol intake.  She was seen 02/27/2018 by [name], PA-C and was felt to be doing well from a cardiac perspective. She continued to smoke approximately half pack daily and was not interested in quitting.  She was admitted 10/2018 and found to have elevated BP with RAS on CTA with L sided RAS greater than 90% and no hemodynamically significant R sided RAS. She was seen by vascular surgery with stent placed to the L side with 5% residual stenosis. CTA also showed pulmonary nodular opacities, largest measuring 7*39mm with recommendation for f/u non-contrast CT in 6-12 months. She c/o epigastric pain with suspicion for pancreatitis. Due to elevated BP, Toprol was changed back to Coreg. Her ACE was held in the setting of L sided RAS and to be restarted after intervention (but never restarted at discharge). Due to elevated LDL, her statin was increased to Lipitor 80mg  with repeat labs recommended in 6-8 weeks. However, patient was then discharged on Lipitor 40mg  and repeat labs never obtained. An updated echo was ordered but never obtained with notes recommending ICD consideration with further reduced EF from that of 2018.   Since that time, the patient reports continued stable epigastric discomfort but not CP. She has not used her nitro. She notes progressive DOE, which she feels has slowly worsened over the last two years. Within the past 2 weeks, she has been experiencing worsening palpitations with occasional SOB and racing HR at rest without clear triggers. She has cut back on her mountain dew and now drinks only water. No caffeine use reported. No presyncope or syncope. She reports controlled LEE on Lasix. No orthopnea, PND, early satiety, or abdominal distention. She continues to smoke 1/2 pack every 3 days but wishes to now quit smoking. She smokes marijuana daily for anxiety and neck pain 2/2 degenerative disks. No reported alcohol use. She  is relatively sedentary and denies any regular exercise program. She obtained a BP cuff and has been checking her BP at home with SBP 128-150s and DBP 90-110 at home. She does not weigh herself daily.  She reports medication compliance. No s/sx consistent with bleeding on DAPT. As above, her statin was increased to Lipitor 80mg  with discharge on Lipitor 40mg  daily.   Home Medications    Prior to Admission medications   Medication Sig Start Date End Date Taking? Authorizing Provider  ALLERGY RELIEF 10 MG tablet TAKE ONE TABLET BY MOUTH DAILY. 10/15/18   Poulose, Bethel Born, NP  aspirin 81 MG tablet Take 1 tablet (81 mg total) by mouth daily. 02/15/12   Theora Gianotti, NP  atorvastatin (LIPITOR) 40 MG tablet Take 1 tablet (40 mg total) by mouth at bedtime. 11/17/18   Schnier, Dolores Lory, MD  carvedilol (COREG) 6.25 MG tablet Take 1 tablet (6.25 mg total) by mouth 2 (two) times daily with a meal. 11/14/18   Vaughan Basta, MD  clopidogrel (PLAVIX) 75 MG tablet TAKE (1) TABLET BY MOUTH EVERY DAY 08/28/18  Wellington Hampshire, MD  Colchicine (MITIGARE) 0.6 MG CAPS Two tablets by mouth once, then one pill one hour later; then one pill per day until flare resolves. No atorvastatin during medication 02/22/18   Laban Emperor, PA-C  furosemide (LASIX) 20 MG tablet Take 1 tablet (20 mg total) by mouth daily. 02/27/18   [name], PA-C  gabapentin (NEURONTIN) 300 MG capsule Take 1 capsule (300 mg total) by mouth 2 (two) times daily. 07/08/18   Poulose, Bethel Born, NP  hydrALAZINE (APRESOLINE) 25 MG tablet Take 1 tablet (25 mg total) by mouth every 8 (eight) hours. 11/14/18   Vaughan Basta, MD  levothyroxine (SYNTHROID, LEVOTHROID) 112 MCG tablet Take 1 tablet (112 mcg total) by mouth daily before breakfast. 07/08/18   Poulose, Bethel Born, NP  nitroGLYCERIN (NITROSTAT) 0.4 MG SL tablet Place 1 tablet (0.4 mg total) under the tongue every 5 (five) minutes x 3 doses as needed for chest pain.  02/15/12   Theora Gianotti, NP  ondansetron (ZOFRAN) 4 MG tablet Take 1 tablet (4 mg total) by mouth every 6 (six) hours as needed for nausea. 11/14/18   Vaughan Basta, MD  oxyCODONE-acetaminophen (PERCOCET/ROXICET) 5-325 MG tablet Take 1 tablet by mouth every 6 (six) hours as needed for moderate pain. 11/14/18   Vaughan Basta, MD  pantoprazole (PROTONIX) 40 MG tablet Take 1 tablet (40 mg total) by mouth daily. 11/15/18   Vaughan Basta, MD  PARoxetine (PAXIL) 40 MG tablet Take 1 tablet (40 mg total) by mouth daily. 11/14/18   Vaughan Basta, MD    Review of Systems    She denies pnd, orthopnea, n, v, dizziness, syncope, edema, weight gain, or early satiety.   She reports stable / unchanged atypical chest pain, worsening palpitations, intermittent racing HR/SOB at rest, DOE progressive x2 years. All other systems reviewed and are otherwise negative except as noted above.  Physical Exam    VS:  BP (!) 160/100 (BP Location: Left Arm, Patient Position: Sitting, Cuff Size: Normal)    Pulse 71    Temp (!) 97.3 F (36.3 C)    Ht 5\' 7"  (1.702 m)    Wt 174 lb (78.9 kg)    SpO2 96%    BMI 27.25 kg/m  , BMI Body mass index is 27.25 kg/m. GEN: Well nourished, well developed, in no acute distress. HEENT: normal. Neck: Supple, no JVD, carotid bruits, or masses. Cardiac: RRR with extrasystole appreciated, 3/6 systolic murmur. No rubs or gallops. No clubbing, cyanosis, edema.  Radials/DP/PT 2+ and equal bilaterally.  Respiratory:  Respirations regular and unlabored, clear to auscultation bilaterally. GI: Soft, nontender, nondistended, BS + x 4. MS: no deformity or atrophy. Skin: warm and dry, no rash. Neuro:  Strength and sensation are intact. Psych: Normal affect.  Accessory Clinical Findings    ECG personally reviewed by me today - SR, 71bpm, PVCs, LAD, IVCD with LAFB, previous septal infarct, borderline LVH - no acute changes other than new ectopy.  Echo  02/21/2017 - Left ventricle: The cavity size was normal. Systolic function was   moderately to severely reduced. The estimated ejection fraction   was in the range of 30% to 35%. Diffuse hypokinesis with severe   hypokinesis /possible akinesis of the anterior and lateral walls.   Doppler parameters are consistent with abnormal left ventricular   relaxation (grade 1 diastolic dysfunction). - Mitral valve: There was mild to moderate regurgitation. - Left atrium: The atrium was normal in size. - Right ventricle: Systolic function was  normal. - Tricuspid valve: There was moderate regurgitation. - Pulmonary arteries: Systolic pressure was mildly elevated.   Ost LM to LM lesion, 10% stenosed. The lesion was previously treated with a stent (unknown type).  Mid LAD lesion, 60% stenosed.  Mid RCA lesion, 30% stenosed.  There is moderate left ventricular systolic dysfunction.   LHC 08/03/2015 1. Widely patent ostial left main stent with moderate mid LAD stenosis and occluded second diagonal with faint collaterals. The right coronary artery is very large with mild mid stenosis. 2. Moderately reduced LV systolic function with an ejection fraction of 35-40% with significant anterior wall hypokinesis. Mildly elevated left ventricular end-diastolic pressure. 3. Significant radial artery spasm which required increased amounts of sedation. Recommendations: Continue medical therapy for coronary artery disease and chronic systolic heart failure. We will need to transition her from clonidine to an ACE inhibitor or ARB with close monitoring of renal function.  Assessment & Plan    HTN, poorly controlled Renal artery stenosis s/p L sided stenting (11/2018) --Recent workup showed RAS with L RAS stenting by vascular surgery. BP 160/100 today with patient reporting home SBP 150s.  --Increase Coreg to 12.5mg  BID, given room in HR/elevated BP/palpitations.  --TID dosing of hydralazine stressed to prevent  rebound (with patient stating she often forgets evening dose). If TID dosing continues to be problematic, consider weaning and alternative agent at follow-up. Reviewed proper technique for BP home checks. She will record twice daily BP for next appointment.   Chronic systolic CHF 2/2 ICM --Euvolemic on exam. Progressive SOB over the last 2 years; however, no other s/sx of worsening HF. Intermittent SOB/racing HR at rest without clear triggers. --Update 2018 echo (above). With further EF decrease, consider repeat ischemic evaluation versus ICD evaluation and with consideration of results of 2 week Zio.  --Update BMET to reassess K+, renal function. ACE/ARB/ARNI +/- KCl supplementation, based on repeat BMET. [ACE held during 10/2018 admission and never restarted].  --Continue Coreg, Lasix.  CAD s/p PCI/DES  --S/p 2013 PCI/DES to left main with repeat cath in 2017 showing widely patent left main stent and additional findings as above. 2018 echo as above showed EF 30-33% with hypokinesis.  --Denies CP or nitro use since last visit. Stable epigastric pain 2/2 pancreatitis. Intermittent SOB, palpitations, and racing HR at rest x2 weeks. --Update echo with plan to reassess for acute structural changes, further decreased EF, or WMA. If EF further decreased, consider further ischemic evaluation and consider need for ICD. --Recheck BMET and start ACE/ARB/ARNI if BMET shows stable renal function. Continue DAPT, increased dose Coreg, statin, PRN SL nitro.  --Statin was increased at 10/2018 admission with patient discharged on home dose of Lipitor 40mg  (instead of Lipitor 80mg ). Increase back to Lipitor 80mg  with recheck LFT/lipids in 6-8 weeks as directly below.  Based on lipids, potentially add Zetia +/- PCSK9i.  Palpitations H/o VT s/p 2013 LHC --Worsening frequency of palpitations and intermittent racing HR/SOB x2 weeks. Recheck BMET, given hypokalemia with K 3.1 on 01/2019 labs. 2 week Zio to r/o arrhythmia.  Update echo.  Mitral regurgitation, mild to moderate --Systolic murmur on exam with progressive dyspnea reported over the last 2 years. Recheck echo with previous echo showing mild to moderate MR in 2018.  HLD --Atorvastatin increased to 80mg  10/2018 admission but discharged on 40mg . Increase back to Lipitor 80mg  with f/u lipid and liver in 6-8 weeks. Based on recheck of lipids, potentially add Zetia +/- PCSK9i if needed.  CKDIV  --Continue to monitor. Recheck BMET.  Hypokalemia --01/14/2019 K+ 3.1. Repeat BMET today. If K+ low, add KCl supplementation with f/u BMET in 1 week.   Tobacco abuse --Smoking cessation advised. Long discussion re: ways to overcome barriers to quitting. Patient indicates desire to quit smoking at this time.  Marijuana use --Daily use. Cessation advised.   Pulmonary nodules --CTA 10/2018 with largest 7*5 mm and recommendation for f/u noncontrast CT.  Epigastric pain, chronic/stable --Chronic with previous documentation attributing to pancreatitis. No alcohol use reported. Cut back on sugary drinks.   Disposition:  Update echo, 2 week Zio, BMET.  Increase Coreg to 12.5mg  BID.  Increase to Lipitor 80mg  daily with f/u labs in 6-8 weeks. (patient called same day and instructed this will be at her pharmacy). F/u 4-6w.  Future considerations: +/- ACE/ARB/ARNI +/- KCl tab (based on BMET).  Further recommendations based on Zio and Echo.   Arvil Chaco, PA-C 01/28/2019, 12:59 PM

## 2019-01-28 NOTE — Patient Instructions (Signed)
Medication Instructions:  1- INCREASE  Coreg Take 1 tablet (12.5 mg total) by mouth 2 (two) times daily with a meal *If you need a refill on your cardiac medications before your next appointment, please call your pharmacy*  Lab Work: Your physician recommends that you have lab work today(BMET) Your physician recommends that you return for lab work in:  weeks at the medical mall.(BMET)No appt is needed. Hours are M-F 7AM- 6 PM.   If you have labs (blood work) drawn today and your tests are completely normal, you will receive your results only by: Marland Kitchen MyChart Message (if you have MyChart) OR . A paper copy in the mail If you have any lab test that is abnormal or we need to change your treatment, we will call you to review the results.  Testing/Procedures: 1- Echo  Please return to [facility] on ______________ at _______________ AM/PM for an Echocardiogram. Your physician has requested that you have an echocardiogram. Echocardiography is a painless test that uses sound waves to create images of your heart. It provides your doctor with information about the size and shape of your heart and how well your heart's chambers and valves are working. This procedure takes approximately one hour. There are no restrictions for this procedure. Please note; depending on visual quality an IV may need to be placed.   2- A zio monitor was placed today. It will remain on for 14 days. You will then return monitor and event diary in provided box. It takes 1-2 weeks for report to be downloaded and returned to [place]. We will call you with the results. If monitor falls of or has orange flashing light, please call Zio for further instructions.    Follow-Up: At [facility], you and your health needs are our priority.  As part of our continuing mission to provide you with exceptional heart care, we have created designated Provider Care Teams.  These Care Teams include your primary Cardiologist (physician)  and Advanced Practice Providers (APPs -  Physician Assistants and Nurse Practitioners) who all work together to provide you with the care you need, when you need it.  Your next appointment:   4-6 weeks  The format for your next appointment:   In Person  Provider:    You may see Kathlyn Sacramento, MD or one of the following Advanced Practice Providers on your designated Care Team:    Murray Hodgkins, NP  [name], PA-C  Marrianne Mood, PA-C

## 2019-01-28 NOTE — Telephone Encounter (Signed)
Patient discharged on Lipitor 40mg . Called and confirmed with patient that this was not due to intolerance of Lipitor 80mg . Informed patient I would send Lipitor 80mg  to her pharmacy for pickup and we would do follow-up labs in 6-8 weeks with patient agreement and understanding.

## 2019-01-29 LAB — BASIC METABOLIC PANEL
BUN/Creatinine Ratio: 12 (ref 9–23)
BUN: 16 mg/dL (ref 6–24)
CO2: 21 mmol/L (ref 20–29)
Calcium: 9.2 mg/dL (ref 8.7–10.2)
Chloride: 106 mmol/L (ref 96–106)
Creatinine, Ser: 1.3 mg/dL — ABNORMAL HIGH (ref 0.57–1.00)
GFR calc Af Amer: 56 mL/min/{1.73_m2} — ABNORMAL LOW (ref >59)
GFR calc non Af Amer: 48 mL/min/{1.73_m2} — ABNORMAL LOW (ref >59)
Glucose: 82 mg/dL (ref 65–99)
Potassium: 3.4 mmol/L — ABNORMAL LOW (ref 3.5–5.2)
Sodium: 143 mmol/L (ref 134–144)

## 2019-02-02 ENCOUNTER — Telehealth: Payer: Self-pay | Admitting: *Deleted

## 2019-02-02 DIAGNOSIS — Z79899 Other long term (current) drug therapy: Secondary | ICD-10-CM

## 2019-02-02 DIAGNOSIS — I5022 Chronic systolic (congestive) heart failure: Secondary | ICD-10-CM

## 2019-02-02 MED ORDER — LOSARTAN POTASSIUM 25 MG PO TABS: 25.0000 mg | ORAL_TABLET | Freq: Every day | ORAL | 3 refills | Status: DC

## 2019-02-02 NOTE — Telephone Encounter (Signed)
-----   Message from Arvil Chaco, PA-C sent at 02/01/2019  4:12 PM EDT ----- Please let Angelica Herrera know that her renal function is stable to slightly improved from 2 weeks ago.   (1) Please start Losartan 25mg  daily (25mg  total).  --Depending on her echo results (current pump function), we may transition from Losartan to a medication called Entresto at her follow-up in 1 month with Dr. Fletcher Anon.  (2) Hold off on starting her on any additional potassium supplement for now.   ---Her potassium remains low though improved from previous labs. Given Losartan may increase her potassium, I will hold off on making too many changes at once.  (3) Order / schedule for recheck BMET in 1 week  --To see how her kidneys and electrolytes do with the start of this new medication.

## 2019-02-02 NOTE — Telephone Encounter (Signed)
Results called to pt. Pt verbalized understanding of results and plan of care. Patient aware to start losartan 25 mg daily and to get repeat lab work in 1 week (October 26 or 27) at the [facility].  Rx sent to pharmacy.   Says she took off monitor because it was itching her skin.  She is going to mail it back. Advised patient to go ahead and call ZIO to let them know what happened and that she would be mailing it back early.  She is aware to keep upcoming appointments date and time.

## 2019-02-02 NOTE — Addendum Note (Signed)
Addended by: Raelene Bott, BRANDY L on: 02/02/2019 03:21 PM   Modules accepted: Orders

## 2019-02-15 ENCOUNTER — Other Ambulatory Visit: Payer: Self-pay | Admitting: Physician Assistant

## 2019-02-16 ENCOUNTER — Other Ambulatory Visit: Payer: Self-pay | Admitting: Oncology

## 2019-02-16 DIAGNOSIS — R911 Solitary pulmonary nodule: Secondary | ICD-10-CM

## 2019-02-16 NOTE — Progress Notes (Signed)
  Pulmonary Nodule Clinic Telephone Note  Received referral from Suezanne Cheshire, NP.   Patient was evaluated in the emergency department for pancreatitis, abdominal pain nausea and vomiting.  She was also having chest pain which prompted a CT angiogram to rule out dissection.  CTA was positive for multiple pulmonary nodule opacities largest measuring 7 x 5 mm.  A noncontrasted chest CT is recommended in 6 to 12 months.  If stable at that time repeat chest CT 18 to 24 months from today's scan for high risk patients.  I recommend follow-up with noncontrasted CT scan of the chest in approximately 3 to 4 months.   Patient is a current everyday smoker. Has history of smoking for 20 + years.   High risk factors include: History of heavy smoking, exposure to asbestos, radium or uranium, personal family history of lung cancer, older age, sex (females greater than males), race (black and native [place] greater than weight), marginal speculation, upper lobe location, multiplicity (less than 5 nodules increases risk for malignancy) and emphysema and/or pulmonary fibrosis.   This recommendation follows the consensus statement: Guidelines for Management of Incidental Pulmonary Nodules Detected on CT Images: From the Fleischner Society 2017; Radiology 2017; 284:228-243.    I have placed order for CT scan without contrast to be completed approximately 6 months from previous CT scan.    I will touch base with patient and schedule him/her virtually for results of the CT scan and recommendations per Fleischner's guidelines and our pulmonary nodule clinic.  Faythe Casa, NP 02/16/2019 3:49 PM

## 2019-02-19 ENCOUNTER — Other Ambulatory Visit: Payer: Self-pay

## 2019-02-19 ENCOUNTER — Telehealth: Payer: Self-pay | Admitting: Cardiovascular Disease

## 2019-02-19 ENCOUNTER — Ambulatory Visit (INDEPENDENT_AMBULATORY_CARE_PROVIDER_SITE_OTHER): Payer: Medicaid Other

## 2019-02-19 DIAGNOSIS — R0602 Shortness of breath: Secondary | ICD-10-CM | POA: Diagnosis not present

## 2019-02-19 DIAGNOSIS — R0600 Dyspnea, unspecified: Secondary | ICD-10-CM

## 2019-02-19 MED ORDER — HYDRALAZINE HCL 25 MG PO TABS: 25.0000 mg | ORAL_TABLET | Freq: Three times a day (TID) | ORAL | 0 refills | Status: DC

## 2019-02-19 NOTE — Telephone Encounter (Signed)
°*  STAT* If patient is at the pharmacy, call can be transferred to refill team.   1. Which medications need to be refilled? (please list name of each medication and dose if known) hydralazine 25 MG 1 tablet every 8 hours   2. Which pharmacy/location (including street and city if local pharmacy) is medication to be sent to? [facility] in [place]  3. Do they need a 30 day or 90 day supply? 90 day

## 2019-02-19 NOTE — Telephone Encounter (Signed)
hydrALAZINE (APRESOLINE) 25 MG tablet 270 tablet 0 02/19/2019    Sig - Route: Take 1 tablet (25 mg total) by mouth every 8 (eight) hours. - Oral   Sent to pharmacy as: hydrALAZINE (APRESOLINE) 25 MG tablet   E-Prescribing Status: Receipt confirmed by pharmacy (02/19/2019 4:39 PM EST)   Pharmacy  [facility] [place] [address]

## 2019-02-24 ENCOUNTER — Encounter: Payer: Self-pay | Admitting: Physician Assistant

## 2019-02-25 ENCOUNTER — Ambulatory Visit (INDEPENDENT_AMBULATORY_CARE_PROVIDER_SITE_OTHER): Payer: Medicaid Other | Admitting: Family Medicine

## 2019-02-25 ENCOUNTER — Encounter: Payer: Self-pay | Admitting: Family Medicine

## 2019-02-25 ENCOUNTER — Ambulatory Visit
Admission: RE | Admit: 2019-02-25 | Discharge: 2019-02-25 | Disposition: A | Discharge status: Home / Self Care | Payer: Medicaid Other | Source: Ambulatory Visit | Attending: Family Medicine | Admitting: Family Medicine

## 2019-02-25 ENCOUNTER — Other Ambulatory Visit: Payer: Self-pay

## 2019-02-25 VITALS — BP 150/90 | HR 79 | Temp 97.8°F | Resp 14 | Ht 67.0 in | Wt 178.7 lb

## 2019-02-25 DIAGNOSIS — R053 Chronic cough: Secondary | ICD-10-CM

## 2019-02-25 DIAGNOSIS — R05 Cough: Secondary | ICD-10-CM

## 2019-02-25 DIAGNOSIS — Z1239 Encounter for other screening for malignant neoplasm of breast: Secondary | ICD-10-CM | POA: Diagnosis not present

## 2019-02-25 DIAGNOSIS — I739 Peripheral vascular disease, unspecified: Secondary | ICD-10-CM | POA: Diagnosis not present

## 2019-02-25 DIAGNOSIS — F411 Generalized anxiety disorder: Secondary | ICD-10-CM

## 2019-02-25 DIAGNOSIS — I701 Atherosclerosis of renal artery: Secondary | ICD-10-CM | POA: Diagnosis not present

## 2019-02-25 DIAGNOSIS — R0602 Shortness of breath: Secondary | ICD-10-CM | POA: Insufficient documentation

## 2019-02-25 DIAGNOSIS — Z9889 Other specified postprocedural states: Secondary | ICD-10-CM | POA: Diagnosis not present

## 2019-02-25 DIAGNOSIS — F3341 Major depressive disorder, recurrent, in partial remission: Secondary | ICD-10-CM

## 2019-02-25 DIAGNOSIS — F17200 Nicotine dependence, unspecified, uncomplicated: Secondary | ICD-10-CM

## 2019-02-25 DIAGNOSIS — I4729 Other ventricular tachycardia: Secondary | ICD-10-CM

## 2019-02-25 DIAGNOSIS — I25119 Atherosclerotic heart disease of native coronary artery with unspecified angina pectoris: Secondary | ICD-10-CM | POA: Diagnosis not present

## 2019-02-25 DIAGNOSIS — I472 Ventricular tachycardia: Secondary | ICD-10-CM | POA: Diagnosis not present

## 2019-02-25 MED ORDER — CLOPIDOGREL BISULFATE 75 MG PO TABS: ORAL_TABLET | ORAL | 3 refills | Status: DC

## 2019-02-25 MED ORDER — PAROXETINE HCL 40 MG PO TABS: 40.0000 mg | ORAL_TABLET | Freq: Every day | ORAL | 1 refills | Status: DC

## 2019-02-25 NOTE — Progress Notes (Signed)
Name: Angelica Herrera   MRN: [id number]    DOB: 04-27-1970   Date:02/25/2019       Progress Note  Chief Complaint  Patient presents with  . Annual Exam     Subjective:   Angelica Herrera is a 48 y.o. female, presents to clinic for routine follow up on the conditions listed above.  She was here for CPE but she has several concerns and complaints today including lesion on left back that is getting larger and that she recently scratched made bleed and it seems worse, painful, scabbed since then.  It has been much worse over the past 6 months  She is also concerned about SOB with very minimal activity. Exertional SOB has been worsening and noticeable for the past 2 years.  She has stairs at her appartment and for 2 years that has been difficult.  Also walking around she get SOB.  She estimates she can walk about 50 yard on a flat surface before getting winded.  She feels SOB, tightness in her chest, difficult to breath, and some wheeze.  Current smoker a pack every 3 days, 1/3 ppd, smoked since she was 7, was a heavier smoker 2-3 ppd for "all her life"  Up until her heart attack 2013.    PFT's pt agrees to get done with COVID testing.   She denies any associated palpitations, lower extremity edema, weight gain, orthopnea, PND, CP  She does have a past medical history of accelerated hypertension, systolic heart failure, coronary artery disease status post PCI, cardiomyopathy, hypothyroid, hyperlipidemia, CKD stage IV  Patient's PCP retired this year from the practice and patient is new to me is transferring care  He does request med refill on Plavix and Paxil.  She states that she takes Paxil for anxiety feels that her symptoms of anxiety and depression are currently well controlled with her medications and she does not have any concerns or side effects at this time.   Patient Active Problem List   Diagnosis Date Noted  . Accelerated hypertension 11/13/2018  . Chest pain  11/12/2018  . S/P primary angioplasty with coronary stent 03/04/2018  . Depression, recurrent ([place]) 12/02/2017  . Chronic systolic heart failure ([place]) 11/21/2017  . Controlled substance agreement signed 09/07/2015  . Coronary artery disease involving native coronary artery   . Allergic rhinitis due to pollen 06/28/2015  . GAD (generalized anxiety disorder) 03/17/2015  . Degenerative disc disease, cervical 03/17/2015  . Hyperlipidemia   . Obesity   . CKD (chronic kidney disease), stage IV ([place])   . Ischemic cardiomyopathy   . NSVT (nonsustained ventricular tachycardia) ([place])   . Hypothyroidism   . History of acute anterior wall MI 02/12/2012  . Essential hypertension, malignant 02/12/2012  . Tobacco abuse 02/12/2012    Past Surgical History:  Procedure Laterality Date  . ABDOMINAL HYSTERECTOMY    . CARDIAC CATHETERIZATION  2013   [facility] s/p stent  . CARDIAC CATHETERIZATION Left 08/03/2015   Procedure: Left Heart Cath and Coronary Angiography;  Surgeon: Wellington Hampshire, MD;  Location: [facility];  Service: Cardiovascular;  Laterality: Left;  . DILATION AND CURETTAGE OF UTERUS    . LEFT HEART CATHETERIZATION WITH CORONARY ANGIOGRAM N/A 02/11/2012   Procedure: LEFT HEART CATHETERIZATION WITH CORONARY ANGIOGRAM;  Surgeon: [name], MD;  Location: [facility] CATH LAB;  Service: Cardiovascular;  Laterality: N/A;  . RENAL INTERVENTION N/A 11/17/2018   Procedure: RENAL INTERVENTION;  Surgeon: Katha Cabal, MD;  Location: [facility]  INVASIVE CV LAB;  Service: Cardiovascular;  Laterality: N/A;    Family History  Problem Relation Age of Onset  . Hypertension Father   . Thyroid disease Father   . AAA (abdominal aortic aneurysm) Father   . Hypertension Mother   . Hyperlipidemia Mother   . Diabetes Mother   . Kidney disease Mother   . Hypertension Sister   . Hypertension Sister   . Supraventricular tachycardia Sister   . Hypertension Brother   . Hypertension Brother   .  Hypertension Daughter   . Cancer Paternal Grandfather        lung    Social History   Socioeconomic History  . Marital status: Legally Separated    Spouse name: Not on file  . Number of children: Not on file  . Years of education: Not on file  . Highest education level: Not on file  Occupational History  . Not on file  Social Needs  . Financial resource strain: Not on file  . Food insecurity    Worry: Not on file    Inability: Not on file  . Transportation needs    Medical: Not on file    Non-medical: Not on file  Tobacco Use  . Smoking status: Current Every Day Smoker    Packs/day: 0.25    Years: 30.00    Pack years: 7.50    Types: Cigarettes  . Smokeless tobacco: Never Used  Substance and Sexual Activity  . Alcohol use: Not Currently    Comment: Says she previously drank socially - never a heavy drinker.  No etoh since 08/2018.  Marland Kitchen Drug use: No    Types: Marijuana  . Sexual activity: Not on file  Lifestyle  . Physical activity    Days per week: Not on file    Minutes per session: Not on file  . Stress: Not on file  Relationships  . Social Herbalist on phone: Not on file    Gets together: Not on file    Attends religious service: Not on file    Active member of club or organization: Not on file    Attends meetings of clubs or organizations: Not on file    Relationship status: Not on file  . Intimate partner violence    Fear of current or ex partner: Not on file    Emotionally abused: Not on file    Physically abused: Not on file    Forced sexual activity: Not on file  Other Topics Concern  . Not on file  Social History Narrative   Lives in [place] w/ her dtr.  Does not routinely exercise.  Disabled/doesn't work.     Current Outpatient Medications:  .  ALLERGY RELIEF 10 MG tablet, TAKE ONE TABLET BY MOUTH DAILY., Disp: 90 tablet, Rfl: 1 .  aspirin 81 MG tablet, Take 1 tablet (81 mg total) by mouth daily., Disp: 30 tablet, Rfl:  .  atorvastatin  (LIPITOR) 80 MG tablet, Take 1 tablet (80 mg total) by mouth at bedtime., Disp: 90 tablet, Rfl: 3 .  carvedilol (COREG) 12.5 MG tablet, Take 1 tablet (12.5 mg total) by mouth 2 (two) times daily with a meal., Disp: 180 tablet, Rfl: 3 .  clopidogrel (PLAVIX) 75 MG tablet, TAKE (1) TABLET BY MOUTH EVERY DAY, Disp: 90 tablet, Rfl: 3 .  furosemide (LASIX) 20 MG tablet, TAKE (1) TABLET BY MOUTH EVERY DAY, Disp: 90 tablet, Rfl: 3 .  gabapentin (NEURONTIN) 300 MG capsule, Take 1  capsule (300 mg total) by mouth 2 (two) times daily., Disp: 60 capsule, Rfl: 5 .  hydrALAZINE (APRESOLINE) 25 MG tablet, Take 1 tablet (25 mg total) by mouth every 8 (eight) hours., Disp: 270 tablet, Rfl: 0 .  levothyroxine (SYNTHROID, LEVOTHROID) 112 MCG tablet, Take 1 tablet (112 mcg total) by mouth daily before breakfast., Disp: 90 tablet, Rfl: 1 .  losartan (COZAAR) 25 MG tablet, Take 1 tablet (25 mg total) by mouth daily., Disp: 90 tablet, Rfl: 3 .  nitroGLYCERIN (NITROSTAT) 0.4 MG SL tablet, Place 1 tablet (0.4 mg total) under the tongue every 5 (five) minutes x 3 doses as needed for chest pain., Disp: 25 tablet, Rfl: 3 .  ondansetron (ZOFRAN) 4 MG tablet, Take 1 tablet (4 mg total) by mouth every 6 (six) hours as needed for nausea., Disp: 20 tablet, Rfl: 0 .  oxyCODONE-acetaminophen (PERCOCET/ROXICET) 5-325 MG tablet, Take 1 tablet by mouth every 6 (six) hours as needed for moderate pain. (Patient not taking: Reported on 01/28/2019), Disp: 20 tablet, Rfl: 0 .  pantoprazole (PROTONIX) 40 MG tablet, Take 1 tablet (40 mg total) by mouth daily., Disp: 30 tablet, Rfl: 0 .  PARoxetine (PAXIL) 40 MG tablet, Take 1 tablet (40 mg total) by mouth daily., Disp: 90 tablet, Rfl: 1  No Known Allergies  I personally reviewed active problem list, medication list, allergies, family history, social history, health maintenance, notes from last encounter, lab results, imaging with the patient/caregiver today.  Review of Systems   Constitutional: Negative.   HENT: Negative.   Eyes: Negative.   Respiratory: Negative.   Cardiovascular: Negative.   Gastrointestinal: Negative.   Endocrine: Negative.   Genitourinary: Negative.   Musculoskeletal: Negative.   Skin: Negative.   Allergic/Immunologic: Negative.   Neurological: Negative.   Hematological: Negative.   Psychiatric/Behavioral: Negative.   All other systems reviewed and are negative.    Objective:    Vitals:   02/25/19 1435  BP: (!) 150/90  Pulse: 79  Resp: 14  Temp: 97.8 F (36.6 C)  SpO2: 99%  Weight: 178 lb 11.2 oz (81.1 kg)  Height: 5\' 7"  (1.702 m)    Body mass index is 27.99 kg/m.  Physical Exam Vitals signs and nursing note reviewed.  Constitutional:      General: She is not in acute distress.    Appearance: Normal appearance. She is well-developed. She is not ill-appearing, toxic-appearing or diaphoretic.     Interventions: Face mask in place.  HENT:     Head: Normocephalic and atraumatic.     Right Ear: External ear normal.     Left Ear: External ear normal.  Eyes:     General: Lids are normal. No scleral icterus.       Right eye: No discharge.        Left eye: No discharge.     Conjunctiva/sclera: Conjunctivae normal.  Neck:     Musculoskeletal: Normal range of motion and neck supple.     Trachea: Phonation normal. No tracheal deviation.  Cardiovascular:     Rate and Rhythm: Normal rate and regular rhythm.     Pulses: Normal pulses.          Radial pulses are 2+ on the right side and 2+ on the left side.       Posterior tibial pulses are 2+ on the right side and 2+ on the left side.     Heart sounds: Normal heart sounds. No murmur. No friction rub. No gallop.   Pulmonary:  Effort: Pulmonary effort is normal. No respiratory distress.     Breath sounds: No stridor. Wheezing present. No rhonchi or rales.     Comments: Slightly diminished breath sounds bilaterally at the bases, faint expiratory wheeze Chest:     Chest  wall: No tenderness.  Abdominal:     General: Bowel sounds are normal. There is no distension.     Palpations: Abdomen is soft.     Tenderness: There is no abdominal tenderness. There is no guarding or rebound.  Musculoskeletal: Normal range of motion.        General: No deformity.     Right lower leg: No edema.     Left lower leg: No edema.  Lymphadenopathy:     Cervical: No cervical adenopathy.  Skin:    General: Skin is warm and dry.     Capillary Refill: Capillary refill takes less than 2 seconds.     Coloration: Skin is not jaundiced or pale.     Findings: Lesion present. No rash.     Comments: Approximately 1.5 x 1 cm oval lesion raised, light brown and dried thickened skin with central abrasion/scabbing, no surrounding edema, erythema or induration.  No fluctuance or tenderness to palpation  Neurological:     Mental Status: She is alert and oriented to person, place, and time.     Motor: No abnormal muscle tone.     Gait: Gait normal.  Psychiatric:        Speech: Speech normal.        Behavior: Behavior normal.      Recent Results (from the past 2160 hour(s))  Troponin I (High Sensitivity)     Status: Abnormal   Collection Time: 01/14/19  9:40 AM  Result Value Ref Range   Troponin I (High Sensitivity) 57 (H) <18 ng/L    Comment: (NOTE) Elevated high sensitivity troponin I (hsTnI) values and significant  changes across serial measurements may suggest ACS but many other  chronic and acute conditions are known to elevate hsTnI results.  Refer to the "Links" section for chest pain algorithms and additional  guidance. Performed at [facility], [address] [place], [place] [address]   Lipase, blood     Status: Abnormal   Collection Time: 01/14/19  9:40 AM  Result Value Ref Range   Lipase 56 (H) 11 - 51 U/L    Comment: Performed at [facility], [place], [place] [address]  CBC with Differential     Status: Abnormal   Collection  Time: 01/14/19  9:40 AM  Result Value Ref Range   WBC 8.8 4.0 - 10.5 K/uL   RBC 5.57 (H) 3.87 - 5.11 MIL/uL   Hemoglobin 16.1 (H) 12.0 - 15.0 g/dL   HCT 48.3 (H) 36.0 - 46.0 %   MCV 86.7 80.0 - 100.0 fL   MCH 28.9 26.0 - 34.0 pg   MCHC 33.3 30.0 - 36.0 g/dL   RDW 15.5 11.5 - 15.5 %   Platelets 325 150 - 400 K/uL   nRBC 0.0 0.0 - 0.2 %   Neutrophils Relative % 76 %   Neutro Abs 6.7 1.7 - 7.7 K/uL   Lymphocytes Relative 18 %   Lymphs Abs 1.5 0.7 - 4.0 K/uL   Monocytes Relative 4 %   Monocytes Absolute 0.4 0.1 - 1.0 K/uL   Eosinophils Relative 1 %   Eosinophils Absolute 0.1 0.0 - 0.5 K/uL   Basophils Relative 1 %   Basophils Absolute  0.1 0.0 - 0.1 K/uL   Immature Granulocytes 0 %   Abs Immature Granulocytes 0.03 0.00 - 0.07 K/uL    Comment: Performed at [facility], [address] [place], [place] [address]  Comprehensive metabolic panel     Status: Abnormal   Collection Time: 01/14/19  9:40 AM  Result Value Ref Range   Sodium 142 135 - 145 mmol/L   Potassium 3.1 (L) 3.5 - 5.1 mmol/L   Chloride 103 98 - 111 mmol/L   CO2 22 22 - 32 mmol/L   Glucose, Bld 136 (H) 70 - 99 mg/dL   BUN 12 6 - 20 mg/dL   Creatinine, Ser 1.42 (H) 0.44 - 1.00 mg/dL   Calcium 10.0 8.9 - 10.3 mg/dL   Total Protein 8.4 (H) 6.5 - 8.1 g/dL   Albumin 4.5 3.5 - 5.0 g/dL   AST 24 15 - 41 U/L   ALT 14 0 - 44 U/L   Alkaline Phosphatase 101 38 - 126 U/L   Total Bilirubin 0.8 0.3 - 1.2 mg/dL   GFR calc non Af Amer 43 (L) >60 mL/min   GFR calc Af Amer 50 (L) >60 mL/min   Anion gap 17 (H) 5 - 15    Comment: Performed at [facility], [address] [place], [place] [address]  Basic metabolic panel     Status: Abnormal   Collection Time: 01/28/19 12:08 PM  Result Value Ref Range   Glucose 82 65 - 99 mg/dL   BUN 16 6 - 24 mg/dL   Creatinine, Ser 1.30 (H) 0.57 - 1.00 mg/dL   GFR calc non Af Amer 48 (L) >59 mL/min/1.73   GFR calc Af Amer 56 (L) >59 mL/min/1.73   BUN/Creatinine Ratio 12  9 - 23   Sodium 143 134 - 144 mmol/L   Potassium 3.4 (L) 3.5 - 5.2 mmol/L   Chloride 106 96 - 106 mmol/L   CO2 21 20 - 29 mmol/L   Calcium 9.2 8.7 - 10.2 mg/dL     PHQ2/9: Depression screen [facility] 2/9 02/25/2019 07/08/2018 11/21/2017 08/20/2017 07/23/2016  Decreased Interest 3 3 3  0 0  Down, Depressed, Hopeless 3 3 2  0 1  PHQ - 2 Score 6 6 5  0 1  Altered sleeping 3 3 3  - -  Tired, decreased energy 3 3 3  - -  Change in appetite 3 2 3  - -  Feeling bad or failure about yourself  3 0 3 - -  Trouble concentrating 3 3 3  - -  Moving slowly or fidgety/restless 3 3 3  - -  Suicidal thoughts 0 0 0 - -  PHQ-9 Score 24 20 23  - -  Difficult doing work/chores Somewhat difficult Very difficult Somewhat difficult - -    phq 9 is positive- but pt states she her mood is good, just physical sx made screening positive - no med changes today, discussed, pt denies SI, HI, AVH   Fall Risk: Fall Risk  02/25/2019 07/08/2018 11/21/2017 08/20/2017 07/23/2016  Falls in the past year? 0 0 No No No  Number falls in past yr: 0 - - - -  Injury with Fall? 0 - - - -  Follow up - - - - -      Functional Status Survey: Is the patient deaf or have difficulty hearing?: No Does the patient have difficulty seeing, even when wearing glasses/contacts?: No Does the patient have difficulty concentrating, remembering, or making decisions?: No Does the patient have difficulty walking  or climbing stairs?: No Does the patient have difficulty dressing or bathing?: No Does the patient have difficulty doing errands alone such as visiting a doctor's office or shopping?: No    Assessment & Plan:       ICD-10-CM   1. Encounter for breast cancer screening other than mammogram  Z12.39 MM 3D SCREEN BREAST BILATERAL   Will come back for CPE but would like to schedule mammogram  2. Recurrent major depressive disorder, in partial remission (HCC)  F33.41 PARoxetine (PAXIL) 40 MG tablet   Patient states mood is improved and well controlled  on current medications  3. GAD (generalized anxiety disorder)  F41.1 PARoxetine (PAXIL) 40 MG tablet   Anxiety improved with Paxil no concerns or side effects  4. Peripheral artery disease (HCC)  I73.9 clopidogrel (PLAVIX) 75 MG tablet   Compliant with medications including Lipitor Plavix and aspirin  5. History of stent insertion of renal artery  Z98.890 clopidogrel (PLAVIX) 75 MG tablet  6. Left renal artery stenosis (HCC)  I70.1 clopidogrel (PLAVIX) 75 MG tablet  7. NSVT (nonsustained ventricular tachycardia) (HCC) Chronic I47.2    Per specialist,  8. Coronary artery disease involving native coronary artery of native heart with angina pectoris ([place])  I25.119   9. Shortness of breath  R06.02 DG Chest 2 View    Pulmonary Function Test [facility] Only   gradually worsening DOE with chronic cough wheeze and sign smoking hx, suspect COPD, but may also be cardiac, work up pulm, pt seeing cardiology  10. Chronic cough  R05 DG Chest 2 View    Pulmonary Function Test [facility] Only   chronic daily productive cough, current smoker, suspect COPD  11. Current smoker  F17.200 DG Chest 2 View    Pulmonary Function Test [facility] Only   discussed smoking cessation     Return for need procedure appt for lesion on back and separate CPE before the end of the year.   Delsa Grana, PA-C 02/25/19 2:44 PM

## 2019-03-01 ENCOUNTER — Telehealth: Payer: Self-pay | Admitting: *Deleted

## 2019-03-01 NOTE — Telephone Encounter (Signed)
Pt made aware of referral to [facility] from PCP. Pt is in agreement to have upcoming CT scan and follow up as recommended.  Pt has been made aware of upcoming appts for follow up CT scan and follow up appt with Jennifer Burns, NP in the [facility]. Pt verbalized understanding. Nothing further needed at this time.  

## 2019-03-05 ENCOUNTER — Telehealth: Payer: Self-pay

## 2019-03-05 ENCOUNTER — Ambulatory Visit: Payer: Medicaid Other | Admitting: Cardiovascular Disease

## 2019-03-05 NOTE — Telephone Encounter (Signed)
Call to patient to review results of Echo.  Pt verbalized understanding and agreeable to POC> she reports upcoming appt with PCP for pulmonary testing.   Pt agrees to think about quitting smoking.   Pt unable to make appt this afternoon and is wanting to reschedule bc her monitor is still in her car and she will send soon.   Pt agreed to take daily weights and will call the office if she has SOB. Will bring readings to OV rescheduled for 12/11.  Advised pt to call for any further questions or concerns.

## 2019-03-05 NOTE — Telephone Encounter (Signed)
-----   Message from Arvil Chaco, [place] sent at 03/05/2019  8:56 AM EST ----- Please let Angelica Herrera know that the pump function of her heart has slightly improved from 30-35% to 35-40% (normal is 50-55%). It appears as if the wall motion of her heart is still less than normal in certain areas, but has improved somewhat from her last echo. Her heart is still stiff, so we will want to keep her blood pressure under control. 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 It also appears as if her right heart pressures are elevated. She has an upcoming appointment with Dr Fletcher Anon today, and if she appears to be holding on to some fluid, he can bump up her lasix (fluid pill) at that time. As we discussed in her appointment, quitting smoking will also be very beneficial.

## 2019-03-09 ENCOUNTER — Encounter: Payer: Self-pay | Admitting: Family Medicine

## 2019-03-19 ENCOUNTER — Other Ambulatory Visit: Payer: Self-pay | Admitting: Physician Assistant

## 2019-03-19 ENCOUNTER — Other Ambulatory Visit: Payer: Self-pay

## 2019-03-19 DIAGNOSIS — G8929 Other chronic pain: Secondary | ICD-10-CM

## 2019-03-19 MED ORDER — GABAPENTIN 300 MG PO CAPS: 300.0000 mg | ORAL_CAPSULE | Freq: Two times a day (BID) | ORAL | 5 refills | Status: DC

## 2019-03-24 ENCOUNTER — Telehealth: Payer: Self-pay | Admitting: Cardiovascular Disease

## 2019-03-24 DIAGNOSIS — R002 Palpitations: Secondary | ICD-10-CM

## 2019-03-24 NOTE — Telephone Encounter (Signed)
Per [place] in Clinical patient needs to reschedule appt if not acute and re apply zio.    Can schedule in office zio and fu out 1 month.     lmov

## 2019-03-25 ENCOUNTER — Encounter: Payer: Self-pay | Admitting: Family Medicine

## 2019-03-25 ENCOUNTER — Other Ambulatory Visit: Payer: Self-pay

## 2019-03-25 ENCOUNTER — Ambulatory Visit (INDEPENDENT_AMBULATORY_CARE_PROVIDER_SITE_OTHER): Payer: Medicaid Other | Admitting: Family Medicine

## 2019-03-25 VITALS — BP 140/88 | HR 72 | Temp 97.9°F | Resp 14 | Ht 67.0 in | Wt 174.1 lb

## 2019-03-25 DIAGNOSIS — L989 Disorder of the skin and subcutaneous tissue, unspecified: Secondary | ICD-10-CM | POA: Diagnosis not present

## 2019-03-25 DIAGNOSIS — C44529 Squamous cell carcinoma of skin of other part of trunk: Secondary | ICD-10-CM

## 2019-03-25 DIAGNOSIS — R05 Cough: Secondary | ICD-10-CM

## 2019-03-25 DIAGNOSIS — R0602 Shortness of breath: Secondary | ICD-10-CM

## 2019-03-25 DIAGNOSIS — R911 Solitary pulmonary nodule: Secondary | ICD-10-CM | POA: Diagnosis not present

## 2019-03-25 DIAGNOSIS — C4492 Squamous cell carcinoma of skin, unspecified: Secondary | ICD-10-CM | POA: Diagnosis not present

## 2019-03-25 DIAGNOSIS — Z720 Tobacco use: Secondary | ICD-10-CM

## 2019-03-25 DIAGNOSIS — R053 Chronic cough: Secondary | ICD-10-CM

## 2019-03-25 MED ORDER — ALBUTEROL SULFATE HFA 108 (90 BASE) MCG/ACT IN AERS: 2.0000 | INHALATION_SPRAY | RESPIRATORY_TRACT | 1 refills | Status: DC | PRN

## 2019-03-25 MED ORDER — LIDOCAINE HCL (PF) 1 % IJ SOLN
2.0000 mL | Freq: Once | INTRAMUSCULAR | Status: AC
  Administered 2019-03-25: 2 mL via INTRADERMAL

## 2019-03-25 NOTE — Telephone Encounter (Signed)
Yes 7 days is fine.

## 2019-03-25 NOTE — Telephone Encounter (Signed)
Spoke with patient to reschedule fu after replacement zio.    She states she was only able to tolerate patch for a few days due to skin reaction.  Please advise about doing the zio repeat / fu .

## 2019-03-25 NOTE — Telephone Encounter (Signed)
Attempted to reschedule .  Patient busy .

## 2019-03-25 NOTE — Progress Notes (Signed)
Patient ID: Angelica Herrera, female    DOB: [birth year], 47 y.o.   MRN: [id number]  PCP: Delsa Grana, PA-C  Chief Complaint  Patient presents with  . Skin Problem    shave biopsy on back    Subjective:   Angelica Herrera is a 48 y.o. female, presents to clinic with CC of the following:  HPI    Here for shave biopsy for lesion on back, since visit on 02/25/2019 the lesion has rapidly changed, still bothersome to patient. More trauma to the area since it is much more raised.  It continues to burn and itch.  It has stayed nearly the same size at the base, slightly oval and 54mm x 10 mm, erythematous base, and dry, thick, pale raised horn tissue centrally about 72mm in diameter, cracking and split in the middle.  It is mildly tender.    Discussed options for procedures, since her lesion has changed we discussed sending her to dermatology or other specialist to have more invasive procedure, such as a wide excision which I do not have everything I need for here in our clinic today.  She also did not hold her Plavix so there is risk for bleeding and discussed that she could come back we could do another day after I gather more supplies and she holds it for few days.  Also suggested I could take off the very top of the really thickened dried part of the lesion but she will likely need to have a follow-up procedure and there is still risk of bleeding infection and repeated procedure etc.  We discussed it at length-patient opted to take off today whenever we could she acknowledge the risk of having to stay in clinic little bit longer to control bleeding with some direct pressure or needing some cautery.  She stated that her irritation burning is so severe that she did not want to wait any longer.  She also inquired about inhaler stating she was previously on maintenance in combination inhalers and that she did not have any inhalers left and she thought I was prescribing some for her.  We did not  discuss that thoroughly at her initial visit.  I did order some lung function test but that has not been completed yet and there is no record in the chart or per the patient of what she was on previously.  [facility] [place]  Hasn't had inhalers in a long time - having chronic cough, wheeze SOB would like to get back on inhalers.   Patient Active Problem List   Diagnosis Date Noted  . Accelerated hypertension 11/13/2018  . Chest pain 11/12/2018  . S/P primary angioplasty with coronary stent 03/04/2018  . Depression, recurrent ([place]) 12/02/2017  . Chronic systolic heart failure ([place]) 11/21/2017  . Controlled substance agreement signed 09/07/2015  . Coronary artery disease involving native coronary artery   . Allergic rhinitis due to pollen 06/28/2015  . GAD (generalized anxiety disorder) 03/17/2015  . Degenerative disc disease, cervical 03/17/2015  . Hyperlipidemia   . Obesity   . CKD (chronic kidney disease), stage IV ([place])   . Ischemic cardiomyopathy   . NSVT (nonsustained ventricular tachycardia) ([place])   . Hypothyroidism   . History of acute anterior wall MI 02/12/2012  . Essential hypertension, malignant 02/12/2012  . Tobacco abuse 02/12/2012      Current Outpatient Medications:  .  ALLERGY RELIEF 10 MG tablet, TAKE ONE TABLET BY MOUTH DAILY., Disp: 90 tablet,  Rfl: 1 .  aspirin 81 MG tablet, Take 1 tablet (81 mg total) by mouth daily., Disp: 30 tablet, Rfl:  .  atorvastatin (LIPITOR) 80 MG tablet, Take 1 tablet (80 mg total) by mouth at bedtime., Disp: 90 tablet, Rfl: 3 .  carvedilol (COREG) 12.5 MG tablet, Take 1 tablet (12.5 mg total) by mouth 2 (two) times daily with a meal., Disp: 180 tablet, Rfl: 3 .  clopidogrel (PLAVIX) 75 MG tablet, TAKE (1) TABLET BY MOUTH EVERY DAY, Disp: 90 tablet, Rfl: 3 .  furosemide (LASIX) 20 MG tablet, TAKE (1) TABLET BY MOUTH EVERY DAY, Disp: 90 tablet, Rfl: 3 .  gabapentin (NEURONTIN) 300 MG capsule, Take 1 capsule (300 mg  total) by mouth 2 (two) times daily., Disp: 60 capsule, Rfl: 5 .  hydrALAZINE (APRESOLINE) 25 MG tablet, Take 1 tablet (25 mg total) by mouth every 8 (eight) hours., Disp: 270 tablet, Rfl: 0 .  levothyroxine (SYNTHROID, LEVOTHROID) 112 MCG tablet, Take 1 tablet (112 mcg total) by mouth daily before breakfast., Disp: 90 tablet, Rfl: 1 .  losartan (COZAAR) 25 MG tablet, Take 1 tablet (25 mg total) by mouth daily., Disp: 90 tablet, Rfl: 3 .  nitroGLYCERIN (NITROSTAT) 0.4 MG SL tablet, Place 1 tablet (0.4 mg total) under the tongue every 5 (five) minutes x 3 doses as needed for chest pain., Disp: 25 tablet, Rfl: 3 .  ondansetron (ZOFRAN) 4 MG tablet, Take 1 tablet (4 mg total) by mouth every 6 (six) hours as needed for nausea., Disp: 20 tablet, Rfl: 0 .  oxyCODONE-acetaminophen (PERCOCET/ROXICET) 5-325 MG tablet, Take 1 tablet by mouth every 6 (six) hours as needed for moderate pain. (Patient not taking: Reported on 01/28/2019), Disp: 20 tablet, Rfl: 0 .  pantoprazole (PROTONIX) 40 MG tablet, Take 1 tablet (40 mg total) by mouth daily., Disp: 30 tablet, Rfl: 0 .  PARoxetine (PAXIL) 40 MG tablet, Take 1 tablet (40 mg total) by mouth daily., Disp: 90 tablet, Rfl: 1   No Known Allergies   Family History  Problem Relation Age of Onset  . Hypertension Father   . Thyroid disease Father   . AAA (abdominal aortic aneurysm) Father   . Hypertension Mother   . Hyperlipidemia Mother   . Diabetes Mother   . Kidney disease Mother   . Hypertension Sister   . Hypertension Sister   . Supraventricular tachycardia Sister   . Hypertension Brother   . Hypertension Brother   . Hypertension Daughter   . Cancer Paternal Grandfather        lung     Social History   Socioeconomic History  . Marital status: Legally Separated    Spouse name: Not on file  . Number of children: Not on file  . Years of education: Not on file  . Highest education level: Not on file  Occupational History  . Not on file    Tobacco Use  . Smoking status: Current Every Day Smoker    Packs/day: 0.25    Years: 30.00    Pack years: 7.50    Types: Cigarettes  . Smokeless tobacco: Never Used  Substance and Sexual Activity  . Alcohol use: Not Currently    Comment: Says she previously drank socially - never a heavy drinker.  No etoh since 08/2018.  Marland Kitchen Drug use: No    Types: Marijuana  . Sexual activity: Not on file  Other Topics Concern  . Not on file  Social History Narrative   Lives in [place] w/  her dtr.  Does not routinely exercise.  Disabled/doesn't work.   Social Determinants of Health   Financial Resource Strain:   . Difficulty of Paying Living Expenses: Not on file  Food Insecurity:   . Worried About Charity fundraiser in the Last Year: Not on file  . Ran Out of Food in the Last Year: Not on file  Transportation Needs:   . Lack of Transportation (Medical): Not on file  . Lack of Transportation (Non-Medical): Not on file  Physical Activity:   . Days of Exercise per Week: Not on file  . Minutes of Exercise per Session: Not on file  Stress:   . Feeling of Stress : Not on file  Social Connections:   . Frequency of Communication with Friends and Family: Not on file  . Frequency of Social Gatherings with Friends and Family: Not on file  . Attends Religious Services: Not on file  . Active Member of Clubs or Organizations: Not on file  . Attends Archivist Meetings: Not on file  . Marital Status: Not on file  Intimate Partner Violence:   . Fear of Current or Ex-Partner: Not on file  . Emotionally Abused: Not on file  . Physically Abused: Not on file  . Sexually Abused: Not on file    I personally reviewed active problem list, medication list, allergies, family history, social history, health maintenance, notes from last encounter, lab results, imaging with the patient/caregiver today.   Review of Systems  Constitutional: Negative.   HENT: Negative.   Eyes: Negative.   Respiratory:  Negative.   Cardiovascular: Negative.   Gastrointestinal: Negative.   Endocrine: Negative.   Genitourinary: Negative.   Musculoskeletal: Negative.   Skin: Negative.   Allergic/Immunologic: Negative.   Neurological: Negative.   Hematological: Negative.   Psychiatric/Behavioral: Negative.   All other systems reviewed and are negative.      Objective:   Vitals:   03/25/19 1435  BP: 140/88  Pulse: 72  Resp: 14  Temp: 97.9 F (36.6 C)  SpO2: 94%  Weight: 174 lb 1.6 oz (79 kg)  Height: 5\' 7"  (1.702 m)    Body mass index is 27.27 kg/m.  Physical Exam Vitals and nursing note reviewed.  Constitutional:      Appearance: She is well-developed.  HENT:     Head: Normocephalic and atraumatic.     Nose: Nose normal.  Eyes:     General:        Right eye: No discharge.        Left eye: No discharge.     Conjunctiva/sclera: Conjunctivae normal.  Neck:     Trachea: No tracheal deviation.  Cardiovascular:     Rate and Rhythm: Normal rate and regular rhythm.  Pulmonary:     Effort: Pulmonary effort is normal. No respiratory distress.     Breath sounds: No stridor.  Musculoskeletal:        General: Normal range of motion.  Skin:    General: Skin is warm and dry.     Findings: Lesion (located to left mid thoracic back,  12 x 10 mm , round, raised 6-8 mm, 12- mm pink to erythematous tissue at the base then 6 mm thickened dried hard material cracked centrally located) present. No rash.     Comments: See images  Neurological:     Mental Status: She is alert.     Motor: No abnormal muscle tone.     Coordination: Coordination normal.  Psychiatric:  Behavior: Behavior normal.          Results for orders placed or performed in visit on 71/06/26  Basic metabolic panel  Result Value Ref Range   Glucose 82 65 - 99 mg/dL   BUN 16 6 - 24 mg/dL   Creatinine, Ser 1.30 (H) 0.57 - 1.00 mg/dL   GFR calc non Af Amer 48 (L) >59 mL/min/1.73   GFR calc Af Amer 56 (L) >59  mL/min/1.73   BUN/Creatinine Ratio 12 9 - 23   Sodium 143 134 - 144 mmol/L   Potassium 3.4 (L) 3.5 - 5.2 mmol/L   Chloride 106 96 - 106 mmol/L   CO2 21 20 - 29 mmol/L   Calcium 9.2 8.7 - 10.2 mg/dL   Procedures Shave biopsy Meds, vitals, and allergies reviewed.  Indication: suspicious lesion, trauma to lesion, enlarged over the past month  Location:  Left upper back  Size: 12 x 10 mm , round, raised 6-8 mm  Verbal informed consent obtained.  I did advise referring her to specialist for more invasive procedure, but she did prefer to have the top shaved off due to severe and worsening pain, understanding that she will likely need a follow up appt and/or procedure.   Pt aware of risks not limited to but including infection,  bleeding, damage to near by organs.  Prep: etoh/betadine  Anesthesia: 1%lidocaine with epi, good effect  Shave made with dermablade  Minimal bleeding, on some blood thinners, direct pressure held for up to roughly 10 min, then cautery was used to achieve hemostasis- no silver nitrate available in clinic.  Tolerated well  Routine postprocedure instructions d/w pt- keep area clean and bandaged, follow up if concerns/spreading erythema/pain.  Bandage applied, will do 1 week wound recheck and review pathology results.    Assessment & Plan:      ICD-10-CM   1. Back skin lesion  L98.9 lidocaine (PF) (XYLOCAINE) 1 % injection 2 mL    Pathology ([facility])  2. Chronic cough  R05 albuterol (VENTOLIN HFA) 108 (90 Base) MCG/ACT inhaler    Ambulatory referral to Pulmonology    budesonide-formoterol (SYMBICORT) 160-4.5 MCG/ACT inhaler  3. Shortness of breath  R06.02 albuterol (VENTOLIN HFA) 108 (90 Base) MCG/ACT inhaler    Ambulatory referral to Pulmonology    budesonide-formoterol (SYMBICORT) 160-4.5 MCG/ACT inhaler  4. Tobacco abuse  Z72.0 albuterol (VENTOLIN HFA) 108 (90 Base) MCG/ACT inhaler    Ambulatory referral to Pulmonology    budesonide-formoterol  (SYMBICORT) 160-4.5 MCG/ACT inhaler  5. Pulmonary nodule  R91.1 Ambulatory referral to Pulmonology    Pt requesting refills on inhalers which are not in the chart - reviewed CT from earlier this year - no emphysema or COPD dx, but bullae and multiple pulmonary nodules - can start symbicort and use albuterol inhaler PRN, but still want PFTs done.  Pt scheduled for f/up CT chest for lung nodule monitoring - scheduled in 06/2019.     Reviewed past meds again for a second time, in reviewing all previous respiratory agents, no inhalers -only Benadryl Flonase Claritin Phenergan Tessalon  Roselyn Reef is calling her old pharmacies to follow-up and see if she has previously been prescribed inhalers - this has now been done and no inhalers previously prescribed at two different pharmacies that pt reported using.  Further chart review-  CT angio chest/abd/pelvis while in hospital for CP with hx of pancreatitis -  11/12/2018: IMPRESSION: CT angiogram chest:  1. No thoracic aortic aneurysm or dissection. There are foci of  aortic atherosclerosis as well as foci of coronary artery calcification.  2. Cardiomegaly with pericardial effusion, findings noted previously.  3.  No demonstrable pulmonary embolus.  4. Multiple pulmonary nodular opacities, largest measuring 7 x 5 mm. Non-contrast chest CT at 6-12 months is recommended. If the nodule is stable at time of repeat CT, then future CT at 18-24 months (from today's scan) is considered optional for low-risk patients, but is recommended for high-risk patients. This recommendation follows the consensus statement: Guidelines for Management of Incidental Pulmonary Nodules Detected on CT Images: From the Fleischner Society 2017; Radiology 2017; 284:228-243. No lung edema or consolidation.  5.  No evident thoracic adenopathy.  CT angiogram abdomen; CT angiogram pelvis:  1. No aneurysm or dissection involving the aorta, major mesenteric, and major  pelvic arterial vessels. 2. Extensive aortic and proximal pelvic arterial atherosclerotic calcification. Areas of hemodynamically significant obstruction noted in the proximal left renal artery as well as in the left common iliac artery. 3. 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 There is also hepatic steatosis. No focal liver lesions appreciable on this study. 4. Reflux of contrast into the inferior vena cava and hepatic veins likely represents increase in right heart pressure. 5. No bowel obstruction. No abscess in the abdomen or pelvis. Appendix region appears normal. 6. No evident renal or ureteral calculus. No hydronephrosis. Urinary bladder wall thickness normal. 7. Stable adrenal hypertrophy bilaterally. Clinical significance of this finding uncertain. Appropriate laboratory correlation may be advisable, particularly if clinical symptoms should warrant. 8. Uterus absent.   Electronically Signed   By: Lowella Grip III M.D.   On: 11/12/2018 13:57  Since pt is new to me, she has complicated med hx, multiple lung nodules, significant cardiac disease and hx, as well as significant smoking hx, I will refer pt to pulmonology - hopefully she will be able to get assessed and get testing done sooner with with the outpt PFTs.     Delsa Grana, PA-C 03/25/19 2:54 PM

## 2019-03-25 NOTE — Patient Instructions (Signed)
Keep covered with bandage, replace once a day, replace more if bandage gets soiled  Apply thin layer of antibiotic ointment for the first 3-5 days, then can apply thin layer of vaseline daily after that.  Follow up if you have concerns of infection.  It will take several weeks to heal, scabbing should occur in the next week and start to gradually decrease in size after that.  Decreasing smoking can help your body help heal faster.  Wound Care, Adult Taking care of your wound properly can help to prevent pain, infection, and scarring. It can also help your wound to heal more quickly. How to care for your wound Wound care      Follow instructions from your health care provider about how to take care of your wound. Make sure you: ? Wash your hands with soap and water before you change the bandage (dressing). If soap and water are not available, use hand sanitizer. ? Change your dressing as told by your health care provider. ? Leave stitches (sutures), skin glue, or adhesive strips in place. These skin closures may need to stay in place for 2 weeks or longer. If adhesive strip edges start to loosen and curl up, you may trim the loose edges. Do not remove adhesive strips completely unless your health care provider tells you to do that.  Check your wound area every day for signs of infection. Check for: ? Redness, swelling, or pain. ? Fluid or blood. ? Warmth. ? Pus or a bad smell.  Ask your health care provider if you should clean the wound with mild soap and water. Doing this may include: ? Using a clean towel to pat the wound dry after cleaning it. Do not rub or scrub the wound. ? Applying a cream or ointment. Do this only as told by your health care provider. ? Covering the incision with a clean dressing.  Ask your health care provider when you can leave the wound uncovered.  Keep the dressing dry until your health care provider says it can be removed. Do not take baths, swim, use a  hot tub, or do anything that would put the wound underwater until your health care provider approves. Ask your health care provider if you can take showers. You may only be allowed to take sponge baths. Medicines   If you were prescribed an antibiotic medicine, cream, or ointment, take or use the antibiotic as told by your health care provider. Do not stop taking or using the antibiotic even if your condition improves.  Take over-the-counter and prescription medicines only as told by your health care provider. If you were prescribed pain medicine, take it 30 or more minutes before you do any wound care or as told by your health care provider. General instructions  Return to your normal activities as told by your health care provider. Ask your health care provider what activities are safe.  Do not scratch or pick at the wound.  Do not use any products that contain nicotine or tobacco, such as cigarettes and e-cigarettes. These may delay wound healing. If you need help quitting, ask your health care provider.  Keep all follow-up visits as told by your health care provider. This is important.  Eat a diet that includes protein, vitamin A, vitamin C, and other nutrient-rich foods to help the wound heal. ? Foods rich in protein include meat, dairy, beans, nuts, and other sources. ? Foods rich in vitamin A include carrots and dark green, leafy vegetables. ?  Foods rich in vitamin C include citrus, tomatoes, and other fruits and vegetables. ? Nutrient-rich foods have protein, carbohydrates, fat, vitamins, or minerals. Eat a variety of healthy foods including vegetables, fruits, and whole grains. Contact a health care provider if:  You received a tetanus shot and you have swelling, severe pain, redness, or bleeding at the injection site.  Your pain is not controlled with medicine.  You have redness, swelling, or pain around the wound.  You have fluid or blood coming from the wound.  Your wound  feels warm to the touch.  You have pus or a bad smell coming from the wound.  You have a fever or chills.  You are nauseous or you vomit.  You are dizzy. Get help right away if:  You have a red streak going away from your wound.  The edges of the wound open up and separate.  Your wound is bleeding, and the bleeding does not stop with gentle pressure.  You have a rash.  You faint.  You have trouble breathing. Summary  Always wash your hands with soap and water before changing your bandage (dressing).  To help with healing, eat foods that are rich in protein, vitamin A, vitamin C, and other nutrients.  Check your wound every day for signs of infection. Contact your health care provider if you suspect that your wound is infected. This information is not intended to replace advice given to you by your health care provider. Make sure you discuss any questions you have with your health care provider. Document Released: 01/09/2008 Document Revised: 07/20/2018 Document Reviewed: 10/17/2015 Elsevier Patient Education  2020 [employer].

## 2019-03-25 NOTE — Telephone Encounter (Addendum)
Message fwd to Dr. Fletcher Anon to advise. Dr. Fletcher Anon the patient had skin irritation when attempting to wear the previous zio monitor for 14 days. Would it be beneficial for the patient decrease the duration of the repeat zio to 7 days.  Order is in [place].

## 2019-03-26 ENCOUNTER — Ambulatory Visit: Payer: Medicaid Other | Admitting: Cardiovascular Disease

## 2019-03-26 MED ORDER — BUDESONIDE-FORMOTEROL FUMARATE 160-4.5 MCG/ACT IN AERO: 2.0000 | INHALATION_SPRAY | Freq: Two times a day (BID) | RESPIRATORY_TRACT | 12 refills | Status: DC

## 2019-03-29 ENCOUNTER — Encounter: Payer: Self-pay | Admitting: Family Medicine

## 2019-03-29 LAB — PATHOLOGY REPORT

## 2019-03-29 LAB — TISSUE SPECIMEN

## 2019-03-30 NOTE — Addendum Note (Signed)
Addended by: Delsa Grana on: 03/30/2019 05:51 PM   Modules accepted: Orders

## 2019-03-31 ENCOUNTER — Encounter: Payer: Medicaid Other | Admitting: Family Medicine

## 2019-04-01 ENCOUNTER — Ambulatory Visit: Payer: Medicaid Other | Admitting: Family Medicine

## 2019-04-01 ENCOUNTER — Encounter: Payer: Medicaid Other | Admitting: Family Medicine

## 2019-04-06 NOTE — Telephone Encounter (Signed)
No ans no vm   °

## 2019-04-20 ENCOUNTER — Other Ambulatory Visit: Payer: Self-pay

## 2019-04-20 ENCOUNTER — Encounter: Payer: Self-pay | Admitting: Family Medicine

## 2019-04-20 ENCOUNTER — Ambulatory Visit: Payer: Medicaid Other | Admitting: Family Medicine

## 2019-04-20 VITALS — BP 118/80 | Temp 96.8°F | Resp 12 | Ht 67.0 in | Wt 176.5 lb

## 2019-04-20 DIAGNOSIS — Z0000 Encounter for general adult medical examination without abnormal findings: Secondary | ICD-10-CM

## 2019-04-20 DIAGNOSIS — R05 Cough: Secondary | ICD-10-CM

## 2019-04-20 DIAGNOSIS — N184 Chronic kidney disease, stage 4 (severe): Secondary | ICD-10-CM

## 2019-04-20 DIAGNOSIS — R0602 Shortness of breath: Secondary | ICD-10-CM

## 2019-04-20 DIAGNOSIS — R911 Solitary pulmonary nodule: Secondary | ICD-10-CM | POA: Diagnosis not present

## 2019-04-20 DIAGNOSIS — J449 Chronic obstructive pulmonary disease, unspecified: Secondary | ICD-10-CM

## 2019-04-20 DIAGNOSIS — C44529 Squamous cell carcinoma of skin of other part of trunk: Secondary | ICD-10-CM

## 2019-04-20 DIAGNOSIS — E038 Other specified hypothyroidism: Secondary | ICD-10-CM

## 2019-04-20 DIAGNOSIS — Z5181 Encounter for therapeutic drug level monitoring: Secondary | ICD-10-CM | POA: Diagnosis not present

## 2019-04-20 DIAGNOSIS — E782 Mixed hyperlipidemia: Secondary | ICD-10-CM

## 2019-04-20 DIAGNOSIS — Z5320 Procedure and treatment not carried out because of patient's decision for unspecified reasons: Secondary | ICD-10-CM

## 2019-04-20 DIAGNOSIS — L989 Disorder of the skin and subcutaneous tissue, unspecified: Secondary | ICD-10-CM

## 2019-04-20 DIAGNOSIS — R0689 Other abnormalities of breathing: Secondary | ICD-10-CM

## 2019-04-20 DIAGNOSIS — R053 Chronic cough: Secondary | ICD-10-CM

## 2019-04-20 DIAGNOSIS — Z720 Tobacco use: Secondary | ICD-10-CM

## 2019-04-20 HISTORY — DX: Squamous cell carcinoma of skin of other part of trunk: C44.529

## 2019-04-20 MED ORDER — TRELEGY ELLIPTA 100-62.5-25 MCG/INH IN AEPB: 1.0000 | INHALATION_SPRAY | Freq: Every day | RESPIRATORY_TRACT | 2 refills | Status: DC

## 2019-04-20 NOTE — Progress Notes (Signed)
Patient: Angelica Herrera, Female    DOB: Aug 07, 1970, 49 y.o.   MRN: [id number] Angelica Grana, PA-C Visit Date: 04/20/2019  Today's Provider: Delsa Grana, PA-C   Chief Complaint  Patient presents with  . Annual Exam  . Wound Check   Subjective:   Annual physical exam:  Angelica Herrera is a 49 y.o. female who presents today for complete physical exam:  Exercise/Activity:  none  Diet/nutrition:  Good diet, no sweets  Sleep: well.  Pt wished to discuss acute complaints and do routine f/up on chronic conditions today in addition to CPE. Advised pt of separate visit billing/coding  Check up on shave biopsy to back - SCC skin itches still, getting larger again, raised.  Derm referral done previously, no appt for several months.  Pathology report reviewed today with pt as well as f/up care and wider excision.    Current smoker, pulmonary nodules, DOE Working on decreasing smoking since last visits also using inhaler.  Before pt was smoking 1 pack in 3 days, now smoking 4-5 cig a day, sleeping more to avoid smoking. She feels that decreased smoking and daily symbicort inhaler has improved her repiratory sx, but she still will get SOB with mild activity and she is using rescue inhaler.  She has CT chest in March as f/up from July 2020 CT angio which showed multiple pulm nodules.  She denies any CP, unintentional weight loss, night sweats, hemoptysis.    BP has been lower since decreasing smoking  Pt sees cardiology for hx of MI, CAD, denies angina   HLD - on statin, tolerating w/o myalgias  Also on plavix, and compliant, continues to bruise easily.  MDD - mood much better back on paxil, she is not as tearful, upset, anxious.  She denies SI, HI, AVH, any self harm.   USPSTF grade A and B recommendations - reviewed and addressed today  Depression:  Phq 9 completed today by patient, was reviewed by me with patient in the room, score is  positive, pt feels better - says PHQ  maybe filled out wrong - paxil 40 mg doing well, no SI    PHQ 2/9 Scores 04/20/2019 03/25/2019 02/25/2019 07/08/2018  PHQ - 2 Score 3 0 6 6  PHQ- 9 Score 16 0 24 20   Depression screen [facility] 2/9 04/20/2019 03/25/2019 02/25/2019 07/08/2018 11/21/2017  Decreased Interest 2 0 3 3 3   Down, Depressed, Hopeless 1 0 3 3 2   PHQ - 2 Score 3 0 6 6 5   Altered sleeping 2 0 3 3 3   Tired, decreased energy 3 0 3 3 3   Change in appetite 2 0 3 2 3   Feeling bad or failure about yourself  1 0 3 0 3  Trouble concentrating 3 0 3 3 3   Moving slowly or fidgety/restless 1 0 3 3 3   Suicidal thoughts 1 0 0 0 0  PHQ-9 Score 16 0 24 20 23   Difficult doing work/chores Not difficult at all Not difficult at all Somewhat difficult Very difficult Somewhat difficult   Alcohol screening:   Office Visit from 04/20/2019 in [facility]  AUDIT-C Score  2      Immunizations and Health Maintenance: Health Maintenance  Topic Date Due  . INFLUENZA VACCINE  07/14/2019 (Originally 11/14/2018)  . TETANUS/TDAP  03/24/2020 (Originally 08/15/1988)  . HIV Screening  Completed  . PAP SMEAR-Modifier  Discontinued     Hep C Screening:   STD testing and prevention (HIV/chl/gon/syphilis):  done, no STDs needed  Intimate partner violence:  Feels safe denies  Sexual History/Pain during Intercourse:   Legally Separated  Menstrual History/LMP/Abnormal Bleeding:  None, denies No LMP recorded. Patient has had a hysterectomy.  Incontinence Symptoms: none  Breast cancer:  Defers  Last Mammogram: several years, doesn't want to do BRCA gene screening: none  Cervical cancer screening: hysterectmy Family hx of cancers - breast, ovarian, uterine, colon:   Pt denies family hx   Osteoporosis:   Discussed high calcium and vitamin D supplementation, weight bearing exercises Pt is supplementing with daily calcium/Vit D. 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 Patient does not qualify.  Due to age Social History   Tobacco Use  . Smoking status: Current Every Day Smoker    Packs/day: 0.25    Years: 30.00    Pack years: 7.50    Types: Cigarettes  . Smokeless tobacco: Never Used  Substance Use Topics  . Alcohol use: Not Currently    Comment: Says she previously drank socially - never a heavy drinker.  No etoh since 08/2018.     ECG: sees cardiology  Blood pressure/Hypertension: BP Readings from Last 3 Encounters:  04/20/19 118/80  03/25/19 140/88  02/25/19 (!) 150/90   Weight/Obesity: Wt Readings from Last 3 Encounters:  04/20/19 176 lb 8 oz (80.1 kg)  03/25/19 174 lb 1.6 oz (79 kg)  02/25/19 178 lb 11.2 oz (81.1 kg)   BMI Readings from Last 3 Encounters:  04/20/19 27.64 kg/m  03/25/19 27.27 kg/m  02/25/19 27.99 kg/m    Lipids:  Lab Results  Component Value Date   CHOL 140 11/13/2018   CHOL 136 02/27/2018   CHOL 109 11/06/2015   Lab Results  Component Value Date   HDL 29 (L) 11/13/2018   HDL 53 02/27/2018   HDL 56 11/06/2015   Lab Results  Component Value Date   LDLCALC 96 11/13/2018   LDLCALC 67 02/27/2018   LDLCALC 42 11/06/2015   Lab Results  Component Value Date   TRIG 74 11/13/2018   TRIG 80 02/27/2018   TRIG 57 11/06/2015   Lab Results  Component Value Date   CHOLHDL 4.8 11/13/2018   CHOLHDL 2.6 02/27/2018   CHOLHDL 1.9 11/06/2015   No results found for: LDLDIRECT Based on the results of lipid panel his/her cardiovascular risk factor ( using [place] )  in the next 10 years is: The 10-year ASCVD risk score Mikey Bussing DC Brooke Bonito., et al., 2013) is: 5.3%   Values used to calculate the score:     Age: 67 years     Sex: Female     Is Non-Hispanic African American: No     Diabetic: No      Tobacco smoker: Yes     Systolic Blood Pressure: 001 mmHg     Is BP treated: Yes     HDL Cholesterol: 29 mg/dL     Total Cholesterol: 140 mg/dL  Glucose:  Glucose  Date Value Ref Range Status  01/28/2019 82 65 - 99 mg/dL Final  08/11/2014 110 (H) mg/dL Final    Comment:    65-99 NOTE: New Reference Range  06/21/14   10/17/2013 115 (H)  65 - 99 mg/dL Final  04/09/2012 82 65 - 99 mg/dL Final   Glucose, Bld  Date Value Ref Range Status  01/14/2019 136 (H) 70 - 99 mg/dL Final  11/14/2018 91 70 - 99 mg/dL Final  11/13/2018 91 70 - 99 mg/dL Final   Glucose-Capillary  Date Value Ref Range Status  05/13/2017 163 (H) 65 - 99 mg/dL Final      Office Visit from 04/20/2019 in [facility]  AUDIT-C Score  2     Depression: Phq 9 is  positive Depression screen [facility] 2/9 04/20/2019 03/25/2019 02/25/2019 07/08/2018 11/21/2017  Decreased Interest 2 0 3 3 3   Down, Depressed, Hopeless 1 0 3 3 2   PHQ - 2 Score 3 0 6 6 5   Altered sleeping 2 0 3 3 3   Tired, decreased energy 3 0 3 3 3   Change in appetite 2 0 3 2 3   Feeling bad or failure about yourself  1 0 3 0 3  Trouble concentrating 3 0 3 3 3   Moving slowly or fidgety/restless 1 0 3 3 3   Suicidal thoughts 1 0 0 0 0  PHQ-9 Score 16 0 24 20 23   Difficult doing work/chores Not difficult at all Not difficult at all Somewhat difficult Very difficult Somewhat difficult   Hypertension: BP Readings from Last 3 Encounters:  04/20/19 118/80  03/25/19 140/88  02/25/19 (!) 150/90   Obesity: Wt Readings from Last 3 Encounters:  04/20/19 176 lb 8 oz (80.1 kg)  03/25/19 174 lb 1.6 oz (79 kg)  02/25/19 178 lb 11.2 oz (81.1 kg)   BMI Readings from Last 3 Encounters:  04/20/19 27.64 kg/m  03/25/19 27.27 kg/m  02/25/19 27.99 kg/m      Advanced Care Planning:  A voluntary discussion about advance care planning including the explanation and discussion of advance directives.   Discussed health care proxy and Living will, and  the patient was able to identify a health care proxy as her sister, Carolan Clines.   Patient does not have a living will at present time.   Social History      She  reports that she has been smoking cigarettes. She has a 7.50 pack-year smoking history. She has never used smokeless tobacco. She reports previous alcohol use. She reports that she does not use drugs.       Social History   Socioeconomic History  . Marital status: Legally Separated    Spouse name: Not on file  . Number of children: Not on file  . Years of education: Not on file  . Highest education level: Not on file  Occupational History  . Not on file  Tobacco Use  . Smoking status: Current Every Day Smoker    Packs/day: 0.25    Years: 30.00    Pack years: 7.50    Types: Cigarettes  . Smokeless tobacco: Never Used  Substance and Sexual Activity  . Alcohol use: Not Currently    Comment: Says she previously drank socially - never a heavy drinker.  No etoh since 08/2018.  Marland Kitchen Drug use: No    Types: Marijuana  . Sexual activity: Not on file  Other Topics Concern  . Not on file  Social History Narrative   Lives in [place] w/ her dtr.  Does not routinely exercise.  Disabled/doesn't work.   Social Determinants of Health   Financial Resource Strain:   . Difficulty of Paying Living Expenses: Not on file  Food Insecurity:   .  Worried About Charity fundraiser in the Last Year: Not on file  . Ran Out of Food in the Last Year: Not on file  Transportation Needs:   . Lack of Transportation (Medical): Not on file  . Lack of Transportation (Non-Medical): Not on file  Physical Activity:   . Days of Exercise per Week: Not on file  . Minutes of Exercise per Session: Not on file  Stress:   . Feeling of Stress : Not on file  Social Connections:   . Frequency of Communication with Friends and Family: Not on file  . Frequency of Social Gatherings with Friends and Family: Not on file  . Attends Religious Services: Not on file  .  Active Member of Clubs or Organizations: Not on file  . Attends Archivist Meetings: Not on file  . Marital Status: Not on file    Family History        Family Status  Relation Name Status  . Father  Alive  . Mother  Deceased       kidney failure  . Sister  Alive  . Sister  Alive  . Brother  Alive  . Brother  Alive  . Daughter oldest [place]  . MGM  Deceased       unknown  . MGF  Deceased       unknown  . PGM  Deceased       unknown  . PGF  Deceased       lung cancer  . Daughter  Alive  . Daughter  Alive        Her family history includes AAA (abdominal aortic aneurysm) in her father; Cancer in her paternal grandfather; Diabetes in her mother; Hyperlipidemia in her mother; Hypertension in her brother, brother, daughter, father, mother, sister, and sister; Kidney disease in her mother; Supraventricular tachycardia in her sister; Thyroid disease in her father.       Family History  Problem Relation Age of Onset  . Hypertension Father   . Thyroid disease Father   . AAA (abdominal aortic aneurysm) Father   . Hypertension Mother   . Hyperlipidemia Mother   . Diabetes Mother   . Kidney disease Mother   . Hypertension Sister   . Hypertension Sister   . Supraventricular tachycardia Sister   . Hypertension Brother   . Hypertension Brother   . Hypertension Daughter   . Cancer Paternal Grandfather        lung    Patient Active Problem List   Diagnosis Date Noted  . Accelerated hypertension 11/13/2018  . Chest pain 11/12/2018  . S/P primary angioplasty with coronary stent 03/04/2018  . Depression, recurrent ([place]) 12/02/2017  . Chronic systolic heart failure ([place]) 11/21/2017  . Controlled substance agreement signed 09/07/2015  . Coronary artery disease involving native coronary artery   . Allergic rhinitis due to pollen 06/28/2015  . GAD (generalized anxiety disorder) 03/17/2015  . Degenerative disc disease, cervical 03/17/2015  . Hyperlipidemia   . Obesity     . CKD (chronic kidney disease), stage IV ([place])   . Ischemic cardiomyopathy   . NSVT (nonsustained ventricular tachycardia) ([place])   . Hypothyroidism   . History of acute anterior wall MI 02/12/2012  . Essential hypertension, malignant 02/12/2012  . Tobacco abuse 02/12/2012    Past Surgical History:  Procedure Laterality Date  . ABDOMINAL HYSTERECTOMY    . CARDIAC CATHETERIZATION  2013   [facility] s/p stent  . CARDIAC CATHETERIZATION Left 08/03/2015  Procedure: Left Heart Cath and Coronary Angiography;  Surgeon: Wellington Hampshire, MD;  Location: [facility];  Service: Cardiovascular;  Laterality: Left;  . DILATION AND CURETTAGE OF UTERUS    . LEFT HEART CATHETERIZATION WITH CORONARY ANGIOGRAM N/A 02/11/2012   Procedure: LEFT HEART CATHETERIZATION WITH CORONARY ANGIOGRAM;  Surgeon: [name], MD;  Location: [facility] CATH LAB;  Service: Cardiovascular;  Laterality: N/A;  . RENAL INTERVENTION N/A 11/17/2018   Procedure: RENAL INTERVENTION;  Surgeon: Katha Cabal, MD;  Location: [facility];  Service: Cardiovascular;  Laterality: N/A;     Current Outpatient Medications:  .  albuterol (VENTOLIN HFA) 108 (90 Base) MCG/ACT inhaler, Inhale 2 puffs into the lungs every 4 (four) hours as needed for wheezing or shortness of breath., Disp: 18 g, Rfl: 1 .  ALLERGY RELIEF 10 MG tablet, TAKE ONE TABLET BY MOUTH DAILY., Disp: 90 tablet, Rfl: 1 .  aspirin 81 MG tablet, Take 1 tablet (81 mg total) by mouth daily., Disp: 30 tablet, Rfl:  .  atorvastatin (LIPITOR) 80 MG tablet, Take 1 tablet (80 mg total) by mouth at bedtime., Disp: 90 tablet, Rfl: 3 .  budesonide-formoterol (SYMBICORT) 160-4.5 MCG/ACT inhaler, Inhale 2 puffs into the lungs 2 (two) times daily., Disp: 1 Inhaler, Rfl: 12 .  carvedilol (COREG) 12.5 MG tablet, Take 1 tablet (12.5 mg total) by mouth 2 (two) times daily with a meal., Disp: 180 tablet, Rfl: 3 .  clopidogrel (PLAVIX) 75 MG tablet, TAKE (1) TABLET BY MOUTH EVERY  DAY, Disp: 90 tablet, Rfl: 3 .  furosemide (LASIX) 20 MG tablet, TAKE (1) TABLET BY MOUTH EVERY DAY, Disp: 90 tablet, Rfl: 3 .  gabapentin (NEURONTIN) 300 MG capsule, Take 1 capsule (300 mg total) by mouth 2 (two) times daily., Disp: 60 capsule, Rfl: 5 .  hydrALAZINE (APRESOLINE) 25 MG tablet, Take 1 tablet (25 mg total) by mouth every 8 (eight) hours., Disp: 270 tablet, Rfl: 0 .  levothyroxine (SYNTHROID, LEVOTHROID) 112 MCG tablet, Take 1 tablet (112 mcg total) by mouth daily before breakfast., Disp: 90 tablet, Rfl: 1 .  losartan (COZAAR) 25 MG tablet, Take 1 tablet (25 mg total) by mouth daily., Disp: 90 tablet, Rfl: 3 .  pantoprazole (PROTONIX) 40 MG tablet, Take 1 tablet (40 mg total) by mouth daily., Disp: 30 tablet, Rfl: 0 .  PARoxetine (PAXIL) 40 MG tablet, Take 1 tablet (40 mg total) by mouth daily., Disp: 90 tablet, Rfl: 1 .  nitroGLYCERIN (NITROSTAT) 0.4 MG SL tablet, Place 1 tablet (0.4 mg total) under the tongue every 5 (five) minutes x 3 doses as needed for chest pain. (Patient not taking: Reported on 04/20/2019), Disp: 25 tablet, Rfl: 3 .  ondansetron (ZOFRAN) 4 MG tablet, Take 1 tablet (4 mg total) by mouth every 6 (six) hours as needed for nausea. (Patient not taking: Reported on 04/20/2019), Disp: 20 tablet, Rfl: 0 .  oxyCODONE-acetaminophen (PERCOCET/ROXICET) 5-325 MG tablet, Take 1 tablet by mouth every 6 (six) hours as needed for moderate pain. (Patient not taking: Reported on 01/28/2019), Disp: 20 tablet, Rfl: 0  No Known Allergies  Patient Care Team: Angelica Grana, PA-C as PCP - General (Family Medicine) Wellington Hampshire, MD as PCP - Cardiology (Cardiology) Lavonia Dana, MD as Consulting Physician (Internal Medicine) Wellington Hampshire, MD as Consulting Physician (Cardiology) Minna Merritts, MD as Consulting Physician (Cardiology) Idolina Primer Areta Haber, PA-C as Physician Assistant (Cardiology)  Review of Systems  Constitutional: Negative.  Negative for activity change, appetite  change, chills, diaphoresis, fatigue, fever and unexpected weight change.  HENT: Negative.   Eyes: Negative.   Respiratory: Negative.   Cardiovascular: Negative.   Gastrointestinal: Negative.   Endocrine: Negative.   Genitourinary: Negative.   Musculoskeletal: Negative.   Skin: Negative.   Allergic/Immunologic: Negative.   Neurological: Negative.   Hematological: Negative.   Psychiatric/Behavioral: Negative.   All other systems reviewed and are negative.    I personally reviewed active problem list, medication list, allergies, family history, social history, health maintenance, notes from last encounter, lab results, imaging with the patient/caregiver today.        Objective:   Vitals:  Vitals:   04/20/19 1102  BP: 118/80  Resp: 12  Temp: (!) 96.8 F (36 C)  TempSrc: Temporal  Weight: 176 lb 8 oz (80.1 kg)  Height: 5' 7"  (1.702 m)    Body mass index is 27.64 kg/m.  Physical Exam Constitutional:      General: She is not in acute distress.    Appearance: Normal appearance. She is well-developed. She is obese. She is not toxic-appearing or diaphoretic.  HENT:     Head: Normocephalic and atraumatic.     Right Ear: External ear normal.     Left Ear: External ear normal.     Nose: Nose normal.     Mouth/Throat:     Pharynx: Uvula midline.  Eyes:     General: Lids are normal.     Conjunctiva/sclera: Conjunctivae normal.     Pupils: Pupils are equal, round, and reactive to light.  Neck:     Trachea: Phonation normal. No tracheal deviation.  Cardiovascular:     Rate and Rhythm: Normal rate and regular rhythm.     Pulses: Normal pulses.          Radial pulses are 2+ on the right side and 2+ on the left side.       Posterior tibial pulses are 2+ on the right side and 2+ on the left side.     Heart sounds: Normal heart sounds. No murmur. No friction rub. No gallop.   Pulmonary:     Effort: Pulmonary effort is normal. No respiratory distress.     Breath sounds: No  stridor. Rales (diffuse rales to b/l lower lobes) present. No wheezing or rhonchi.  Chest:     Chest wall: No tenderness.     Breasts:        Right: Normal. No swelling, bleeding, inverted nipple, mass, nipple discharge, skin change or tenderness.        Left: Normal. No swelling, bleeding, inverted nipple, mass, nipple discharge, skin change or tenderness.  Abdominal:     General: Bowel sounds are normal. There is no distension.     Palpations: Abdomen is soft.     Tenderness: There is no abdominal tenderness. There is no guarding or rebound.  Musculoskeletal:        General: No deformity. Normal range of motion.     Cervical back: Normal range of motion and neck supple.  Lymphadenopathy:     Cervical: No cervical adenopathy.     Upper Body:     Right upper body: No supraclavicular, axillary or pectoral adenopathy.     Left upper body: No supraclavicular, axillary or pectoral adenopathy.  Skin:    General: Skin is warm and dry.     Capillary Refill: Capillary refill takes less than 2 seconds.     Coloration: Skin is not pale.     Findings: Lesion  present. No rash.     Comments: Raised thickened dry patch roughly 10 mm x 12 mm to left upper back  Neurological:     Mental Status: She is alert and oriented to person, place, and time.     Motor: No abnormal muscle tone.     Gait: Gait normal.  Psychiatric:        Mood and Affect: Mood normal.        Speech: Speech normal.        Behavior: Behavior normal.       Fall Risk: Fall Risk  04/20/2019 03/25/2019 02/25/2019 07/08/2018 11/21/2017  Falls in the past year? 1 0 0 0 No  Number falls in past yr: 0 0 0 - -  Injury with Fall? 1 0 0 - -  Follow up - - - - -    Functional Status Survey: Is the patient deaf or have difficulty hearing?: No Does the patient have difficulty seeing, even when wearing glasses/contacts?: No Does the patient have difficulty concentrating, remembering, or making decisions?: Yes(memory) Does the patient  have difficulty walking or climbing stairs?: No Does the patient have difficulty dressing or bathing?: No Does the patient have difficulty doing errands alone such as visiting a doctor's office or shopping?: No   Assessment & Plan:    CPE completed today  . USPSTF grade A and B recommendations reviewed with patient; age-appropriate recommendations, preventive care, screening tests, etc discussed and encouraged; healthy living encouraged; see AVS for patient education given to patient  . Discussed importance of 150 minutes of physical activity weekly, AHA exercise recommendations given to pt in AVS/handout  . Discussed importance of healthy diet:  eating lean meats and proteins, avoiding trans fats and saturated fats, avoid simple sugars and excessive carbs in diet, eat 6 servings of fruit/vegetables daily and drink plenty of water and avoid sweet beverages.    . Recommended pt to do annual eye exam and routine dental exams/cleanings  . Depression, alcohol, fall screening completed as documented above and per flowsheets  . Reviewed Health Maintenance: Health Maintenance  Topic Date Due  . INFLUENZA VACCINE  07/14/2019 (Originally 11/14/2018)  . TETANUS/TDAP  03/24/2020 (Originally 08/15/1988)  . HIV Screening  Completed  . PAP SMEAR-Modifier  Discontinued     ICD-10-CM   1. Adult general medical exam  Z00.00 CBC w/ Diff    CMP w GFR    Lipid Panel    TSH   done today  2. Back skin lesion  L98.9    shave biosy healed well, but lesion enlarging again rapidly - see below  3. Squamous cell carcinoma of back  C44.529 Ambulatory referral to General Surgery   referrred to sugery for f/up excision, derm not available for several month, lesion with rapid change cannot wait, still see derm for skin survey etc  4. CKD (chronic kidney disease), stage IV ([facility])  N18.4 CMP w GFR   close monitoing of renal function, pt BP good, she is focusing on hydration  5. Mixed hyperlipidemia  E78.2 CMP w GFR      Lipid Panel   on statin  6. Other specified hypothyroidism  E03.8 TSH  7. Encounter for medication monitoring  Z51.81 CBC w/ Diff    CMP w GFR    Lipid Panel    TSH  8. Pulmonary nodule  R91.1    has CT chest coming up and pulm [facility]  9. Chronic cough  R05   10.  Shortness of breath  R06.02 Fluticasone-Umeclidin-Vilant (TRELEGY ELLIPTA) 100-62.5-25 MCG/INH AEPB   some improvement but still some DOE with minor activity - will watch closely possiblity of chronic lung dx etiology, pt to f/up cardiology  11. Tobacco abuse  Z72.0 Fluticasone-Umeclidin-Vilant (TRELEGY ELLIPTA) 100-62.5-25 MCG/INH AEPB   decreasing cigarette use, congratulated on efforts   12. Chronic obstructive pulmonary disease, unspecified COPD type ([place])  J44.9 Fluticasone-Umeclidin-Vilant (TRELEGY ELLIPTA) 100-62.5-25 MCG/INH AEPB   tx with trelegy and SABA  13. Abnormal breath sounds  R06.89    b/l lower lobe rales with diminishes BS and poor inspiratory efforts, no rhonchi, no wheezing, concern for ILD? pulm edema? no other CHF sx, overall DOE improv        Angelica Grana, PA-C 04/20/19 11:22 AM  [facility]

## 2019-04-20 NOTE — Patient Instructions (Signed)

## 2019-04-21 LAB — COMPLETE METABOLIC PANEL WITH GFR
AG Ratio: 1.8 (calc) (ref 1.0–2.5)
ALT: 10 U/L (ref 6–29)
AST: 12 U/L (ref 10–35)
Albumin: 3.8 g/dL (ref 3.6–5.1)
Alkaline phosphatase (APISO): 78 U/L (ref 31–125)
BUN/Creatinine Ratio: 13 (calc) (ref 6–22)
BUN: 17 mg/dL (ref 7–25)
CO2: 29 mmol/L (ref 20–32)
Calcium: 9 mg/dL (ref 8.6–10.2)
Chloride: 106 mmol/L (ref 98–110)
Creat: 1.3 mg/dL — ABNORMAL HIGH (ref 0.50–1.10)
GFR, Est African American: 56 mL/min/{1.73_m2} — ABNORMAL LOW (ref >=60)
GFR, Est Non African American: 48 mL/min/{1.73_m2} — ABNORMAL LOW (ref >=60)
Globulin: 2.1 g/dL (calc) (ref 1.9–3.7)
Glucose, Bld: 80 mg/dL (ref 65–99)
Potassium: 3.1 mmol/L — ABNORMAL LOW (ref 3.5–5.3)
Sodium: 143 mmol/L (ref 135–146)
Total Bilirubin: 1 mg/dL (ref 0.2–1.2)
Total Protein: 5.9 g/dL — ABNORMAL LOW (ref 6.1–8.1)

## 2019-04-21 LAB — TSH: TSH: 4.46 mIU/L

## 2019-04-21 LAB — CBC WITH DIFFERENTIAL/PLATELET
Absolute Monocytes: 422 cells/uL (ref 200–950)
Basophils Absolute: 32 cells/uL (ref 0–200)
Basophils Relative: 0.5 %
Eosinophils Absolute: 63 cells/uL (ref 15–500)
Eosinophils Relative: 1 %
HCT: 42.1 % (ref 35.0–45.0)
Hemoglobin: 14.1 g/dL (ref 11.7–15.5)
Lymphs Abs: 1405 cells/uL (ref 850–3900)
MCH: 30.1 pg (ref 27.0–33.0)
MCHC: 33.5 g/dL (ref 32.0–36.0)
MCV: 89.8 fL (ref 80.0–100.0)
MPV: 9.8 fL (ref 7.5–12.5)
Monocytes Relative: 6.7 %
Neutro Abs: 4379 cells/uL (ref 1500–7800)
Neutrophils Relative %: 69.5 %
Platelets: 222 10*3/uL (ref 140–400)
RBC: 4.69 10*6/uL (ref 3.80–5.10)
RDW: 14.4 % (ref 11.0–15.0)
Total Lymphocyte: 22.3 %
WBC: 6.3 10*3/uL (ref 3.8–10.8)

## 2019-04-21 LAB — LIPID PANEL
Cholesterol: 82 mg/dL (ref <200)
HDL: 34 mg/dL — ABNORMAL LOW (ref >=50)
LDL Cholesterol (Calc): 34 mg/dL (calc)
Non-HDL Cholesterol (Calc): 48 mg/dL (calc) (ref <130)
Total CHOL/HDL Ratio: 2.4 (calc) (ref <5.0)
Triglycerides: 68 mg/dL (ref <150)

## 2019-04-22 NOTE — Telephone Encounter (Signed)
Attempted to schedule. Call answered and dropped .  Closing encounter.

## 2019-04-22 NOTE — Telephone Encounter (Signed)
Noted  

## 2019-04-26 ENCOUNTER — Ambulatory Visit: Payer: Medicaid Other | Admitting: Surgery

## 2019-04-28 ENCOUNTER — Ambulatory Visit: Payer: Medicaid Other | Admitting: Surgery

## 2019-04-30 ENCOUNTER — Ambulatory Visit: Payer: Medicaid Other | Admitting: Surgery

## 2019-05-06 ENCOUNTER — Institutional Professional Consult (permissible substitution): Payer: Medicaid Other | Admitting: Internal Medicine

## 2019-06-21 ENCOUNTER — Ambulatory Visit: Payer: Medicaid Other

## 2019-06-22 ENCOUNTER — Ambulatory Visit: Payer: Medicaid Other | Admitting: Family Medicine

## 2019-06-22 ENCOUNTER — Telehealth: Payer: Self-pay | Admitting: *Deleted

## 2019-06-22 ENCOUNTER — Inpatient Hospital Stay: Payer: Medicaid Other | Admitting: Oncology

## 2019-06-22 NOTE — Telephone Encounter (Signed)
Left message with patient that follow up visit with Sonia Baller, NP today has been cancelled since she did not have her CT scan yesterday. Instructed to call back when ready to reschedule appts for CT scan and follow up visit with Sonia Baller, NP in the lung nodule clinic.

## 2019-07-12 ENCOUNTER — Encounter: Payer: Self-pay | Admitting: *Deleted

## 2019-08-13 ENCOUNTER — Other Ambulatory Visit: Payer: Self-pay | Admitting: Physician Assistant

## 2019-08-16 ENCOUNTER — Other Ambulatory Visit: Payer: Self-pay

## 2019-08-16 DIAGNOSIS — E038 Other specified hypothyroidism: Secondary | ICD-10-CM

## 2019-08-16 MED ORDER — LEVOTHYROXINE SODIUM 112 MCG PO TABS: 112.0000 ug | ORAL_TABLET | Freq: Every day | ORAL | 1 refills | Status: DC

## 2019-08-17 ENCOUNTER — Encounter: Payer: Self-pay | Admitting: *Deleted

## 2019-08-17 NOTE — Progress Notes (Signed)
Pt has not returned call to reschedule appts in the lung nodule clinic. Letter mailed to pt to remind to reschedule appts. PCP made aware as well.

## 2019-08-20 ENCOUNTER — Other Ambulatory Visit: Payer: Self-pay | Admitting: Cardiovascular Disease

## 2019-08-20 DIAGNOSIS — E038 Other specified hypothyroidism: Secondary | ICD-10-CM

## 2019-08-20 DIAGNOSIS — Z5181 Encounter for therapeutic drug level monitoring: Secondary | ICD-10-CM

## 2019-08-20 NOTE — Telephone Encounter (Signed)
LVM to schedule

## 2019-08-20 NOTE — Telephone Encounter (Signed)
Please schedule overdue F/U appointment. Thank you! ?

## 2019-08-20 NOTE — Telephone Encounter (Signed)
*  STAT* If patient is at the pharmacy, call can be transferred to refill team.   1. Which medications need to be refilled? (please list name of each medication and dose if known) hydralazine 25 mg bid  2. Which pharmacy/location (including street and city if local pharmacy) is medication to be sent to? [facility] in [place]  3. Do they need a 30 day or 90 day supply? [place]

## 2019-08-24 ENCOUNTER — Other Ambulatory Visit: Payer: Self-pay | Admitting: Physician Assistant

## 2019-08-24 NOTE — Telephone Encounter (Signed)
*  STAT* If patient is at the pharmacy, call can be transferred to refill team.   1. Which medications need to be refilled? (please list name of each medication and dose if known) Hydralazine 2. Which pharmacy/location (including street and city if local pharmacy) is medication to be sent to? [facility]  [place]  3. Do they need a 30 day or 90 day supply? [place]

## 2019-08-25 ENCOUNTER — Other Ambulatory Visit: Payer: Self-pay | Admitting: Family Medicine

## 2019-08-25 ENCOUNTER — Encounter: Payer: Self-pay | Admitting: Family

## 2019-08-25 ENCOUNTER — Ambulatory Visit (INDEPENDENT_AMBULATORY_CARE_PROVIDER_SITE_OTHER): Payer: Medicaid Other | Admitting: Family

## 2019-08-25 ENCOUNTER — Other Ambulatory Visit: Payer: Self-pay

## 2019-08-25 VITALS — BP 134/90 | HR 57 | Ht 61.0 in | Wt 164.2 lb

## 2019-08-25 DIAGNOSIS — I701 Atherosclerosis of renal artery: Secondary | ICD-10-CM

## 2019-08-25 DIAGNOSIS — R002 Palpitations: Secondary | ICD-10-CM

## 2019-08-25 DIAGNOSIS — I10 Essential (primary) hypertension: Secondary | ICD-10-CM

## 2019-08-25 DIAGNOSIS — R911 Solitary pulmonary nodule: Secondary | ICD-10-CM | POA: Diagnosis not present

## 2019-08-25 DIAGNOSIS — I2510 Atherosclerotic heart disease of native coronary artery without angina pectoris: Secondary | ICD-10-CM

## 2019-08-25 DIAGNOSIS — E785 Hyperlipidemia, unspecified: Secondary | ICD-10-CM

## 2019-08-25 DIAGNOSIS — M542 Cervicalgia: Secondary | ICD-10-CM

## 2019-08-25 DIAGNOSIS — Z720 Tobacco use: Secondary | ICD-10-CM

## 2019-08-25 DIAGNOSIS — I255 Ischemic cardiomyopathy: Secondary | ICD-10-CM

## 2019-08-25 MED ORDER — HYDRALAZINE HCL 25 MG PO TABS: 25.0000 mg | ORAL_TABLET | Freq: Two times a day (BID) | ORAL | 1 refills | Status: DC

## 2019-08-25 MED ORDER — NITROGLYCERIN 0.4 MG SL SUBL: 0.4000 mg | SUBLINGUAL_TABLET | SUBLINGUAL | 3 refills | Status: AC | PRN

## 2019-08-25 NOTE — Patient Instructions (Addendum)
Medication Instructions:  No medication changes today.   *If you need a refill on your cardiac medications before your next appointment, please call your pharmacy*  Lab Work: No lab work today.   Your recent cholesterol numbers looked good!  Testing/Procedures: Your EKG today was stable compared to previous. This is a good result!  Follow-Up: At [facility], you and your health needs are our priority.  As part of our continuing mission to provide you with exceptional heart care, we have created designated Provider Care Teams.  These Care Teams include your primary Cardiologist (physician) and Advanced Practice Providers (APPs -  Physician Assistants and Nurse Practitioners) who all work together to provide you with the care you need, when you need it.  We recommend signing up for the patient portal called "MyChart".  Sign up information is provided on this After Visit Summary.  MyChart is used to connect with patients for Virtual Visits (Telemedicine).  Patients are able to view lab/test results, encounter notes, upcoming appointments, etc.  Non-urgent messages can be sent to your provider as well.   To learn more about what you can do with MyChart, go to [email or web address].    Your next appointment:   4 month(s)  The format for your next appointment:   Either In Person or Virtual  Provider:   You may see Kathlyn Sacramento, MD or one of the following Advanced Practice Providers on your designated Care Team:    Murray Hodgkins, NP  [name], PA-C  Marrianne Mood, PA-C  Laurann Montana, NP  Other Instructions  Follow up with your primary care regarding your CT scan of your chest and pulmonary (lung) nodules.   If your heart rate runs below 55 beats per minute and you feel weak, tired, dizzy - please call and let us know. We could consider decreasing your Carvedilol (Coreg) dose if you become symptomatic.   Your palpitations are likely PVCs which are early beats in the  bottom chamber of your hearts that are not dangerous. Your Carvedilol will help decrease how often these can happen. These can be triggered by stress, being dehydration, caffeine, alcohol, nicotine.    Coping with Quitting Smoking  Quitting smoking is a physical and mental challenge. You will face cravings, withdrawal symptoms, and temptation. Before quitting, work with your health care provider to make a plan that can help you cope. Preparation can help you quit and keep you from giving in. How can I cope with cravings? Cravings usually last for 5-10 minutes. If you get through it, the craving will pass. Consider taking the following actions to help you cope with cravings:  Keep your mouth busy: ? Chew sugar-free gum. ? Suck on hard candies or a straw. ? Brush your teeth.  Keep your hands and body busy: ? Immediately change to a different activity when you feel a craving. ? Squeeze or play with a ball. ? Do an activity or a hobby, like making bead jewelry, practicing needlepoint, or working with wood. ? Mix up your normal routine. ? Take a short exercise break. Go for a quick walk or run up and down stairs. ? Spend time in public places where smoking is not allowed.  Focus on doing something kind or helpful for someone else.  Call a friend or family member to talk during a craving.  Join a support group.  Call a quit line, such as 1-800-QUIT-NOW.  Talk with your health care provider about medicines that might help you cope with  cravings and make quitting easier for you. How can I deal with withdrawal symptoms? Your body may experience negative effects as it tries to get used to not having nicotine in the system. These effects are called withdrawal symptoms. They may include:  Feeling hungrier than normal.  Trouble concentrating.  Irritability.  Trouble sleeping.  Feeling depressed.  Restlessness and agitation.  Craving a cigarette. To manage withdrawal  symptoms:  Avoid places, people, and activities that trigger your cravings.  Remember why you want to quit.  Get plenty of sleep.  Avoid coffee and other caffeinated drinks. These may worsen some of your symptoms. How can I handle social situations? Social situations can be difficult when you are quitting smoking, especially in the first few weeks. To manage this, you can:  Avoid parties, bars, and other social situations where people might be smoking.  Avoid alcohol.  Leave right away if you have the urge to smoke.  Explain to your family and friends that you are quitting smoking. Ask for understanding and support.  Plan activities with friends or family where smoking is not an option. What are some ways I can cope with stress? Wanting to smoke may cause stress, and stress can make you want to smoke. Find ways to manage your stress. Relaxation techniques can help. For example:  Breathe slowly and deeply, in through your nose and out through your mouth.  Listen to soothing, relaxing music.  Talk with a family member or friend about your stress.  Light a candle.  Soak in a bath or take a shower.  Think about a peaceful place. What are some ways I can prevent weight gain? Be aware that many people gain weight after they quit smoking. However, not everyone does. To keep from gaining weight, have a plan in place before you quit and stick to the plan after you quit. Your plan should include:  Having healthy snacks. When you have a craving, it may help to: ? Eat plain popcorn, crunchy carrots, celery, or other cut vegetables. ? Chew sugar-free gum.  Changing how you eat: ? Eat small portion sizes at meals. ? Eat 4-6 small meals throughout the day instead of 1-2 large meals a day. ? Be mindful when you eat. Do not watch television or do other things that might distract you as you eat.  Exercising regularly: ? Make time to exercise each day. If you do not have time for a long  workout, do short bouts of exercise for 5-10 minutes several times a day. ? Do some form of strengthening exercise, like weight lifting, and some form of aerobic exercise, like running or swimming.  Drinking plenty of water or other low-calorie or no-calorie drinks. Drink 6-8 glasses of water daily, or as much as instructed by your health care provider. Summary  Quitting smoking is a physical and mental challenge. You will face cravings, withdrawal symptoms, and temptation to smoke again. Preparation can help you as you go through these challenges.  You can cope with cravings by keeping your mouth busy (such as by chewing gum), keeping your body and hands busy, and making calls to family, friends, or a helpline for people who want to quit smoking.  You can cope with withdrawal symptoms by avoiding places where people smoke, avoiding drinks with caffeine, and getting plenty of rest.  Ask your health care provider about the different ways to prevent weight gain, avoid stress, and handle social situations. This information is not intended to replace advice  given to you by your health care provider. Make sure you discuss any questions you have with your health care provider. Document Revised: 03/14/2017 Document Reviewed: 03/29/2016 Elsevier Patient Education  2020 [employer].

## 2019-08-25 NOTE — Progress Notes (Signed)
Office Visit    Patient Name: Angelica Herrera Date of Encounter: 08/25/2019  Primary Care Provider:  Delsa Grana, PA-C Primary Cardiologist:  Kathlyn Sacramento, MD Electrophysiologist:  None   Chief Complaint    Angelica Herrera is a 49 y.o. female with a hx of CAD s/p anterior STEMI 01/2012 s/p PCI/DES to LM, ICM, poorly controlled HTN, RAS s/p L sided RAS stenting 11/17/18, CKD IV, h/o NSVT, HLD, hypothyroidism, tobacco use, pulmonary nodules, obesity presents today for follow up of CAD.    Past Medical History    Past Medical History:  Diagnosis Date  . Arthritis    gout  . CAD (coronary artery disease)    a. 02/2012 s/p anterolateral STEMI/Cath/PCI: LM 100 (3.0x15 DES ), LAD sm, severely diseased (PTCA w/ 1.10mm balloon), LCX nl, RI moderate size, nl, RCA 30-63m, AM/PDA/PLA nl, R->L collats noted; b. cath 08/03/15: widely patent ostLM stent at 10%, mLAD 60%,  mRCA 30%, EF 35-40%, ant wall HK, mildly elevated LVEDP; c. 02/2017 MV: ant infarct, mild peri-infarct isch (similar to 2017 study)->Med Rx.  . Chronic combined systolic (congestive) and diastolic (congestive) heart failure ([place])    a. 02/2017 Echo: EF 30-35%, diff HK, sev HK/poss AK of ant and lat walls. Gr1 DD. Mild to mod MR. Nl RV fxn. Mod TR.  . CKD (chronic kidney disease), stage IV ([place])   . HTN (hypertension)   . Hyperlipidemia   . Hypothyroidism   . Ischemic cardiomyopathy    a. 02/2012 Echo: EF 40-45%, ant/lat HK, Gr1 DD, Mild MR, PASP 79mmHg; b. 02/2017 Echo: EF 30-35%.  . NSVT (nonsustained ventricular tachycardia) ([place])    a. 02/2012 post-MI  . Obesity   . Pancreatitis   . S/P primary angioplasty with coronary stent 03/04/2018   Dual antiplatelet therapy INDEFINITELY per cardiology note Nov 2019  . Tobacco abuse    Past Surgical History:  Procedure Laterality Date  . ABDOMINAL HYSTERECTOMY    . CARDIAC CATHETERIZATION  2013   [facility] s/p stent  . CARDIAC CATHETERIZATION Left 08/03/2015   Procedure:  Left Heart Cath and Coronary Angiography;  Surgeon: Wellington Hampshire, MD;  Location: [facility];  Service: Cardiovascular;  Laterality: Left;  . DILATION AND CURETTAGE OF UTERUS    . LEFT HEART CATHETERIZATION WITH CORONARY ANGIOGRAM N/A 02/11/2012   Procedure: LEFT HEART CATHETERIZATION WITH CORONARY ANGIOGRAM;  Surgeon: [name], MD;  Location: [facility];  Service: Cardiovascular;  Laterality: N/A;  . RENAL INTERVENTION N/A 11/17/2018   Procedure: RENAL INTERVENTION;  Surgeon: Katha Cabal, MD;  Location: [facility];  Service: Cardiovascular;  Laterality: N/A;    Allergies  No Known Allergies  History of Present Illness    Angelica Herrera is a 49 y.o. female with a hx of CAD s/p anterior STEMI 01/2012 s/p PCI/DES to LM, ICM, poorly controlled HTN, RAS s/p L sided RAS stenting 11/17/18, CKD IV, h/o NSVT, HLD, hypothyroidism, tobacco use, pulmonary nodules, obesity. She was last seen 01/28/19 by Elenor Quinones, PA.  Admitted 01/2012 with anterolateral STEMI. Urgent cath showed occlusion of LM with successful PCI/DES and PTCA to the LAD secondary to embolization from LM.  Postprocedure she had hemodynamic instability with DVT and hypertension.  Echo with LVEF 45% without significant MR.  Lost to follow-up in 2013 reestablish in 2017.  2017 she had atypical chest pain with subsequent stress showing prior anterior infarct with mild peri-infarct ischemia and EF 33%.  Repeat cardiac cath  with patent LM stent with moderate M LAD stenosis and occluded D2 with faint collaterals.  The RCA was very large with mild mid stenosis.  LVEF 35-40% with anterior wall hypokinesis.  Medical manage advised.  Repeat atypical chest pain in 2018 with repeat nuclear stress testing showing similar findings.  January 2019 she was seen in ED with gastritis and head trauma with a negative head CT in the setting of increased alcohol intake.  She has continued to smoke.  She was admitted 10/2018 found  to have elevated BP with RIS on CTA with left-sided RAS greater than 90% and no hemodynamically significant right-sided RAS.  Seen by vascular surgery and stent placed to the left side with 5% residual stenosis.  CTA also showed pulmonary nodular opacities, largest measuring 7.5 mm with recommendation for follow-up noncontrast CT in 6 to 12 months.  Epigastric pain with suspicion for pancreatitis.  Toprol was changed to Coreg due to elevated BP.  ACE was held in the setting of left-sided RAS and to be restarted after intervention but was not resumed at discharge.  Lipitor increased to 80 mg, however she was discharged on 40 mg and repeat labs were never obtained.  Updated echo was ordered but never obtained.  On last seen 01/2019 reported progressive dyspnea on exertion and palpitations.  Patient recommended for echocardiogram, 2 weeks ZIO.  Her carvedilol was increased to 12.5 mg twice daily and her Lipitor increased to 80 mg. Echo 02/19/19 showed improved LVEF to 35-40%, anterior and septal wall hypokinesis, gr2DD, mildly dilated LV, RV mildly enlarged with elevated right heart pressures, LA mild-moderately dilated, RA mildly dilated, moderately elevated PASP, small pericardial effusion. She wore monitor for a few days, had skin irritation, and never returned for monitor placement and initial monitor not sent back to company for data download.   Reports no shortness of breath at rest.  Reports her dyspnea on exertion is actually improved since starting Trelegy with her primary care provider and losing 10 pounds.. Reports no chest pain, pressure, or tightness. No edema, orthopnea, PND. Reports no palpitations.  Reports lightheadedness only with position changes.  We discussed orthostatic hypotension and preventative measures.  Congratulated on 12 pound weight loss over the last few months.  Tells me she cut out bread and sugars.  Notices marked improvement in her dyspnea on exertion since losing  weight.  Continues to work on smoking cessation.  Smoking 1 pack every 3-4 days. Has tried patch in the past without success.  We discussed the possibility of Chantix and she prefers to continue to reduce on her own first.   EKGs/Labs/Other Studies Reviewed:   The following studies were reviewed today:  Echo 02/19/19 1. Left ventricular ejection fraction, by visual estimation, is 35 to  40%. The left ventricle has moderately decreased function. There is no  left ventricular hypertrophy. Anterior and Septal wall hypokinesis.   2. Left ventricular diastolic parameters are consistent with Grade II  diastolic dysfunction (pseudonormalization).   3. Mildly dilated left ventricular internal cavity size.   4. Global right ventricle has normal systolic function.The right  ventricular size is mildly enlarged. No increase in right ventricular wall  thickness.   5. Left atrial size was mild-moderately dilated.   6. Right atrial size was mildly dilated.   7. Moderately elevated pulmonary artery systolic pressure.   8. The tricuspid regurgitant velocity is 2.80 m/s, and with an assumed  right atrial pressure of 20 mmHg, the estimated right ventricular systolic  pressure  is moderately elevated at 51.2 mmHg.   9. Small pericardial effusion.    EKG:  EKG is ordered today.  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  Stable compared to previous.  No acute ST/T wave changes.  Recent Labs: 11/13/2018: Magnesium 2.0 04/20/2019: ALT 10; BUN 17; Creat 1.30; Hemoglobin 14.1; Platelets 222; Potassium 3.1; Sodium 143; TSH 4.46  Recent Lipid Panel    Component Value Date/Time   CHOL 82 04/20/2019 1134   CHOL 136 02/27/2018 1207   TRIG 68 04/20/2019 1134   HDL 34 (L) 04/20/2019 1134   HDL 53 02/27/2018 1207   CHOLHDL 2.4 04/20/2019 1134   VLDL 15 11/13/2018 0628   LDLCALC 34 04/20/2019 1134     Home Medications   No outpatient medications have been marked as taking for the 08/25/19 encounter (Appointment) with Loel Dubonnet, NP.    Review of Systems   Review of Systems  Constitution: Negative for chills, fever and malaise/fatigue.  Cardiovascular: Positive for dyspnea on exertion. Negative for chest pain, irregular heartbeat, leg swelling, near-syncope, orthopnea, palpitations and syncope.  Respiratory: Negative for cough, shortness of breath and wheezing.   Gastrointestinal: Negative for melena, nausea and vomiting.  Genitourinary: Negative for hematuria.  Neurological: Negative for dizziness, light-headedness and weakness.   All other systems reviewed and are otherwise negative except as noted above.  Physical Exam    VS:  There were no vitals taken for this visit. , BMI There is no height or weight on file to calculate BMI. GEN: Well nourished, well developed, in no acute distress. HEENT: normal. Neck: Supple, no JVD, carotid bruits, or masses. Cardiac: RRR, no murmurs, rubs, or gallops. No clubbing, cyanosis, edema.  Radials/DP/PT 2+ and equal bilaterally.  Respiratory:  Respirations regular and unlabored.  Bilateral upper lobes clear to auscultation with no adventitious breath sounds.  Bilateral lower lobes with rhonchi. GI: Soft, nontender, nondistended, BS + x 4. MS: No deformity or atrophy. Skin: Warm and dry, no rash. Neuro:  Strength and sensation are intact. Psych: Normal affect.  Assessment & Plan    1. CAD - EKG today no acute ST/T wave changes. Reports no chest pain, pressure, tightness. Reports dyspnea on exertion has improved compared to when last seen. No indication for ischemic evaluation at this time. Continue GDMT which includes DAPT (aspirin/Plavix), beta blocker, statin, PRN nitroglycerin. She is mildly bradycardic at 59bpm but asymptomatic, she will monitor HR at home and report HR consistently <55 bpm for consideration of reduced dose of  Coreg.   2. ICM/Chronic systolic and diastolic HF - Echo 63/8466 with LVEF 35-40%, gr2DD.  NYHA II. Euvolemic and well compensated. Continue GDMT including ARB, Coreg, Lasix 20mg  daily, Hydralazine. She prefers not to make medication changes today as she feels well so we will defer any transition to [facility] or addition or MRA. May be financial constraint as she tells me she has to budget carefully.    3. HLD, LDL goal < 70 - 04/20/19 cholesterol panel with LDL 34. ALT/AST normal 04/20/19. Continue Atorvastatin 80mg  daily.   4. HTN - BP well controlled today. Continue present antihypertensive regimen.   5. RAS - s/p R renal stent. Follows with [facility].   6. Tobacco use -Continues to smoke 1 pack over 3 to 4 days.  Plans to continue to decrease.  Politely declines Chantix today. Smoking cessation encouraged. Recommend utilization of 1800QUITNOW.  7.  Pulmonary nodule - CT angio chest 11/12/18 with multiple pulmonary nodule opacities, largest 7x23mm with recommendation for non-contrast chest CT at 6-12 months. Repeat CT chest ordered but not completed.  Tells me she will schedule follow-up with her primary care to discuss.  Tells me there was an issue with Medicaid covering.  8. Palpitations - Reports intermittent palpitations that are not bothersome. Known hx of PVC. Unable to wear ZIO as previously had dermatitis.  Improved since reducing her caffeine intake.  No indication for monitoring at this time.  9. CKD IV - 04/20/19 labs creatinine 1.3, GFR 48. Careful titration of diuretics and antihypertensives.  10. COPD - Likely etiology of most of her DOE. Follows with PCP. Previously referred to pulmonology but missed appointment.   11. MDD - Follows with primary care.   12. GERD - Reports symptoms are well controlled. Continue Pantoprazole 40mg  daily.   Disposition: Follow up in 4 month(s) with Dr. Fletcher Anon or APP.  She does have transportation issues.  She was agreeable to schedule as an in office visit but  would be appropriate to transfer to a virtual visit if needed given transportation issues.  Loel Dubonnet, NP 08/25/2019, 7:51 AM

## 2019-09-02 ENCOUNTER — Ambulatory Visit (INDEPENDENT_AMBULATORY_CARE_PROVIDER_SITE_OTHER): Payer: Medicaid Other | Admitting: Family Medicine

## 2019-09-02 ENCOUNTER — Encounter: Payer: Self-pay | Admitting: Family Medicine

## 2019-09-02 ENCOUNTER — Other Ambulatory Visit: Payer: Self-pay

## 2019-09-02 VITALS — BP 122/84 | HR 61 | Temp 98.1°F | Resp 14 | Ht 61.0 in | Wt 169.9 lb

## 2019-09-02 DIAGNOSIS — Z1211 Encounter for screening for malignant neoplasm of colon: Secondary | ICD-10-CM

## 2019-09-02 DIAGNOSIS — E782 Mixed hyperlipidemia: Secondary | ICD-10-CM

## 2019-09-02 DIAGNOSIS — L989 Disorder of the skin and subcutaneous tissue, unspecified: Secondary | ICD-10-CM

## 2019-09-02 DIAGNOSIS — I10 Essential (primary) hypertension: Secondary | ICD-10-CM

## 2019-09-02 DIAGNOSIS — J019 Acute sinusitis, unspecified: Secondary | ICD-10-CM | POA: Diagnosis not present

## 2019-09-02 DIAGNOSIS — Z5181 Encounter for therapeutic drug level monitoring: Secondary | ICD-10-CM | POA: Diagnosis not present

## 2019-09-02 DIAGNOSIS — Z7185 Encounter for immunization safety counseling: Secondary | ICD-10-CM

## 2019-09-02 DIAGNOSIS — Z7189 Other specified counseling: Secondary | ICD-10-CM

## 2019-09-02 DIAGNOSIS — J441 Chronic obstructive pulmonary disease with (acute) exacerbation: Secondary | ICD-10-CM | POA: Diagnosis not present

## 2019-09-02 DIAGNOSIS — E038 Other specified hypothyroidism: Secondary | ICD-10-CM

## 2019-09-02 DIAGNOSIS — N184 Chronic kidney disease, stage 4 (severe): Secondary | ICD-10-CM

## 2019-09-02 DIAGNOSIS — J310 Chronic rhinitis: Secondary | ICD-10-CM

## 2019-09-02 DIAGNOSIS — J449 Chronic obstructive pulmonary disease, unspecified: Secondary | ICD-10-CM | POA: Diagnosis not present

## 2019-09-02 DIAGNOSIS — J329 Chronic sinusitis, unspecified: Secondary | ICD-10-CM

## 2019-09-02 HISTORY — DX: Chronic obstructive pulmonary disease, unspecified: J44.9

## 2019-09-02 HISTORY — DX: Chronic sinusitis, unspecified: J32.9

## 2019-09-02 MED ORDER — PREDNISONE 20 MG PO TABS: ORAL_TABLET | ORAL | 0 refills | Status: DC

## 2019-09-02 MED ORDER — FLUTICASONE PROPIONATE 50 MCG/ACT NA SUSP: 2.0000 | Freq: Every day | NASAL | 6 refills | Status: DC

## 2019-09-02 MED ORDER — IPRATROPIUM-ALBUTEROL 0.5-2.5 (3) MG/3ML IN SOLN: 3.0000 mL | Freq: Three times a day (TID) | RESPIRATORY_TRACT | 1 refills | Status: DC | PRN

## 2019-09-02 MED ORDER — CETIRIZINE HCL 10 MG PO TABS: 10.0000 mg | ORAL_TABLET | Freq: Every day | ORAL | 11 refills | Status: DC

## 2019-09-02 MED ORDER — TRELEGY ELLIPTA 100-62.5-25 MCG/INH IN AEPB: 1.0000 | INHALATION_SPRAY | Freq: Every day | RESPIRATORY_TRACT | 11 refills | Status: DC

## 2019-09-02 MED ORDER — NEBULIZER/TUBING/MOUTHPIECE KIT: PACK | 0 refills | Status: DC

## 2019-09-02 MED ORDER — ALBUTEROL SULFATE HFA 108 (90 BASE) MCG/ACT IN AERS: 2.0000 | INHALATION_SPRAY | RESPIRATORY_TRACT | 5 refills | Status: DC | PRN

## 2019-09-02 MED ORDER — DOXYCYCLINE HYCLATE 100 MG PO CAPS: 100.0000 mg | ORAL_CAPSULE | Freq: Two times a day (BID) | ORAL | 0 refills | Status: DC

## 2019-09-02 NOTE — Progress Notes (Signed)
Name: Angelica Herrera   MRN: [id number]    DOB: October 05, 1970   Date:09/02/2019       Progress Note  Chief Complaint  Patient presents with  . Nasal Congestion    chest congestion,cough ongoing thinks it may be from gas heat in home  . Injections    discuss covid vaccine, pt states has hx of blood clots  . skin check    check spot on back, that was removed     Subjective:   Angelica Herrera is a 49 y.o. female, presents to clinic for routine follow up on the conditions listed above- however, she is complaining today of severe headache, nasal symptoms congestion, postnasal drip, worsening productive cough at night and in the morning and worsening wheezing shortness of breath.  Will do office visit for her acute complaints today, will obtain labs for her chronic conditions but she will need to reschedule her routine office visit to follow-up on her chronic diagnoses and medications  Back lesion -we previously removed a lesion on her back about 5 months ago -shave biopsy, pathology needed f/up with dermatology and she was referred to dermatology, but was unable to go due to transportation, she wants it rechecked today.  She states that the raised patch that remained did fall off on its own and now the skin in the area is flat.  She is not having any raised itchy lesions no burning or discomfort like she had previously  She complains of recently worsening but not new nasal congestion/HA's, causing worsening URI sx, respiratory sx. her cough is much worse at bedtime she is propping herself up due to drainage and worsening productive cough.  She also feels very congested with pressure in her nose and face and increased chest congestion in the morning and she coughs up a lot of sputum tends to feel a little bit better in the daytime.  She has had headaches and facial pain more severely for the past week or so she has not had fever sweats or chills but she does feel generally ill and unwell. She  was unable to get trilogy due to her insurance coverage but has continued taking Symbicort daily and has been using her albuterol rescue inhaler and is in need of a refill.  She has been treating all of her URI and respiratory symptoms with DayQuil and NyQuil but is leaving her feeling very groggy and is not decreasing discharge and congestion.  She has questions about Covid vaccines she reports a history of blood clots  She has history of accelerated hypertension, coronary artery disease status post stent, chronic systolic heart failure, hyperlipidemia, CKD she does see cardiology and vascular specialist and is on Plavix.  She is having to space out her medications and states that today all the medicine prescribed she would not be able to get for another 2 to 3 weeks due to getting paid once a month she cannot afford the co-pays on her medicines and she is having difficulty with transportation as well.  BP today is well controlled. She has not LE edema or weight gain, is having worse breathing at night but she believes this is due to nasal symptoms chest congestion and COPD exacerbation and not due to congestive heart failure. Wt Readings from Last 5 Encounters:  09/02/19 169 lb 14.4 oz (77.1 kg)  08/25/19 164 lb 4 oz (74.5 kg)  04/20/19 176 lb 8 oz (80.1 kg)  03/25/19 174 lb 1.6 oz (79 kg)  02/25/19 178 lb 11.2 oz (81.1 kg)   BMI Readings from Last 5 Encounters:  09/02/19 32.10 kg/m  08/25/19 31.03 kg/m  04/20/19 27.64 kg/m  03/25/19 27.27 kg/m  02/25/19 27.99 kg/m     Patient Active Problem List   Diagnosis Date Noted  . Squamous cell carcinoma of back 04/20/2019  . Accelerated hypertension 11/13/2018  . Chest pain 11/12/2018  . S/P primary angioplasty with coronary stent 03/04/2018  . Depression, recurrent ([place]) 12/02/2017  . Chronic systolic heart failure ([place]) 11/21/2017  . Controlled substance agreement signed 09/07/2015  . Coronary artery disease involving native  coronary artery   . Allergic rhinitis due to pollen 06/28/2015  . GAD (generalized anxiety disorder) 03/17/2015  . Degenerative disc disease, cervical 03/17/2015  . Hyperlipidemia   . Obesity   . CKD (chronic kidney disease), stage IV ([place])   . Ischemic cardiomyopathy   . NSVT (nonsustained ventricular tachycardia) ([place])   . Hypothyroidism   . History of acute anterior wall MI 02/12/2012  . Essential hypertension, malignant 02/12/2012  . Tobacco abuse 02/12/2012    Past Surgical History:  Procedure Laterality Date  . ABDOMINAL HYSTERECTOMY    . CARDIAC CATHETERIZATION  2013   [facility] s/p stent  . CARDIAC CATHETERIZATION Left 08/03/2015   Procedure: Left Heart Cath and Coronary Angiography;  Surgeon: Wellington Hampshire, MD;  Location: [facility];  Service: Cardiovascular;  Laterality: Left;  . DILATION AND CURETTAGE OF UTERUS    . LEFT HEART CATHETERIZATION WITH CORONARY ANGIOGRAM N/A 02/11/2012   Procedure: LEFT HEART CATHETERIZATION WITH CORONARY ANGIOGRAM;  Surgeon: [name], MD;  Location: [facility] CATH LAB;  Service: Cardiovascular;  Laterality: N/A;  . RENAL INTERVENTION N/A 11/17/2018   Procedure: RENAL INTERVENTION;  Surgeon: Katha Cabal, MD;  Location: [place];  Service: Cardiovascular;  Laterality: N/A;    Family History  Problem Relation Age of Onset  . Hypertension Father   . Thyroid disease Father   . AAA (abdominal aortic aneurysm) Father   . Hypertension Mother   . Hyperlipidemia Mother   . Diabetes Mother   . Kidney disease Mother   . Hypertension Sister   . Hypertension Sister   . Supraventricular tachycardia Sister   . Hypertension Brother   . Hypertension Brother   . Hypertension Daughter   . Cancer Paternal Grandfather        lung    Social History   Tobacco Use  . Smoking status: Current Every Day Smoker    Packs/day: 0.25    Years: 30.00    Pack years: 7.50    Types: Cigarettes  . Smokeless tobacco: Never Used    Substance Use Topics  . Alcohol use: Not Currently    Comment: Says she previously drank socially - never a heavy drinker.  No etoh since 08/2018.  Marland Kitchen Drug use: No    Types: Marijuana      Current Outpatient Medications:  .  albuterol (VENTOLIN HFA) 108 (90 Base) MCG/ACT inhaler, Inhale 2 puffs into the lungs every 4 (four) hours as needed for wheezing or shortness of breath., Disp: 18 g, Rfl: 1 .  aspirin 81 MG tablet, Take 1 tablet (81 mg total) by mouth daily., Disp: 30 tablet, Rfl:  .  atorvastatin (LIPITOR) 80 MG tablet, Take 1 tablet (80 mg total) by mouth at bedtime., Disp: 90 tablet, Rfl: 3 .  budesonide-formoterol (SYMBICORT) 160-4.5 MCG/ACT inhaler, Inhale 2 puffs into the lungs 2 (two) times daily., Disp: 1  Inhaler, Rfl: 12 .  carvedilol (COREG) 12.5 MG tablet, Take 1 tablet (12.5 mg total) by mouth 2 (two) times daily with a meal., Disp: 180 tablet, Rfl: 3 .  clopidogrel (PLAVIX) 75 MG tablet, TAKE (1) TABLET BY MOUTH EVERY DAY, Disp: 90 tablet, Rfl: 3 .  furosemide (LASIX) 20 MG tablet, TAKE (1) TABLET BY MOUTH EVERY DAY, Disp: 90 tablet, Rfl: 3 .  gabapentin (NEURONTIN) 300 MG capsule, TAKE (1) CAPSULE BY MOUTH TWICE DAILY, Disp: 60 capsule, Rfl: 5 .  hydrALAZINE (APRESOLINE) 25 MG tablet, Take 1 tablet (25 mg total) by mouth in the morning and at bedtime., Disp: 180 tablet, Rfl: 1 .  levothyroxine (SYNTHROID) 112 MCG tablet, Take 112 mcg by mouth daily before breakfast., Disp: , Rfl:  .  nitroGLYCERIN (NITROSTAT) 0.4 MG SL tablet, Place 1 tablet (0.4 mg total) under the tongue every 5 (five) minutes x 3 doses as needed for chest pain., Disp: 25 tablet, Rfl: 3 .  PARoxetine (PAXIL) 40 MG tablet, Take 1 tablet (40 mg total) by mouth daily., Disp: 90 tablet, Rfl: 1 .  Fluticasone-Umeclidin-Vilant (TRELEGY ELLIPTA) 100-62.5-25 MCG/INH AEPB, Inhale 1 puff into the lungs daily. Rinse out mouth and spit after each use (Patient not taking: Reported on 09/02/2019), Disp: 60 each, Rfl:  2  No Known Allergies  Chart Review Today: I personally reviewed active problem list, medication list, allergies, family history, social history, health maintenance, notes from last encounter, lab results, imaging with the patient/caregiver today.   Review of Systems  10 Systems reviewed and are negative for acute change except as noted in the HPI.  Objective:    Vitals:   09/02/19 0951  BP: 122/84  Pulse: 61  Resp: 14  Temp: 98.1 F (36.7 C)  SpO2: 93%  Weight: 169 lb 14.4 oz (77.1 kg)  Height: 5' 1" (1.549 m)    Body mass index is 32.1 kg/m.  Physical Exam Vitals and nursing note reviewed.  Constitutional:      General: She is not in acute distress.    Appearance: Normal appearance. She is well-developed. She is not ill-appearing, toxic-appearing or diaphoretic.  HENT:     Head: Normocephalic and atraumatic.     Right Ear: Hearing, tympanic membrane, ear canal and external ear normal.     Left Ear: Hearing, tympanic membrane, ear canal and external ear normal.     Nose: Mucosal edema, congestion and rhinorrhea present.     Right Turbinates: Enlarged and swollen.     Left Turbinates: Enlarged and swollen.     Right Sinus: Maxillary sinus tenderness and frontal sinus tenderness present.     Left Sinus: Maxillary sinus tenderness and frontal sinus tenderness present.     Mouth/Throat:     Mouth: Mucous membranes are not pale and dry.     Pharynx: Uvula midline. Posterior oropharyngeal erythema present. No pharyngeal swelling, oropharyngeal exudate or uvula swelling.     Tonsils: No tonsillar exudate or tonsillar abscesses.  Eyes:     General:        Right eye: No discharge.        Left eye: No discharge.     Conjunctiva/sclera: Conjunctivae normal.  Neck:     Trachea: No tracheal deviation.  Cardiovascular:     Rate and Rhythm: Normal rate and regular rhythm.     Pulses: Normal pulses.  Pulmonary:     Effort: Pulmonary effort is normal. No respiratory  distress.     Breath sounds: No stridor.  Rhonchi present. No wheezing or rales.  Abdominal:     General: Bowel sounds are normal. There is no distension.     Palpations: Abdomen is soft.  Musculoskeletal:     Cervical back: Normal range of motion and neck supple.     Right lower leg: No edema.     Left lower leg: No edema.  Skin:    General: Skin is warm and dry.     Coloration: Skin is not pale.     Findings: No rash.     Comments: Flat oblong hyperpigments macule to left upper back roughly 15 x 10 mm  Neurological:     Mental Status: She is alert.     Motor: No abnormal muscle tone.     Coordination: Coordination normal.  Psychiatric:        Behavior: Behavior normal.        PHQ2/9: Depression screen [facility] 2/9 09/02/2019 04/20/2019 03/25/2019 02/25/2019 07/08/2018  Decreased Interest 0 2 0 3 3  Down, Depressed, Hopeless 0 1 0 3 3  PHQ - 2 Score 0 3 0 6 6  Altered sleeping 0 2 0 3 3  Tired, decreased energy 0 3 0 3 3  Change in appetite 0 2 0 3 2  Feeling bad or failure about yourself  0 1 0 3 0  Trouble concentrating 0 3 0 3 3  Moving slowly or fidgety/restless 0 1 0 3 3  Suicidal thoughts 0 1 0 0 0  PHQ-9 Score 0 16 0 24 20  Difficult doing work/chores Not difficult at all Not difficult at all Not difficult at all Somewhat difficult Very difficult  Some recent data might be hidden    phq 9 is reviewed and neg  Fall Risk: Fall Risk  09/02/2019 04/20/2019 03/25/2019 02/25/2019 07/08/2018  Falls in the past year? 1 1 0 0 0  Number falls in past yr: 1 0 0 0 -  Injury with Fall? 0 1 0 0 -  Follow up - - - - -    Functional Status Survey: Is the patient deaf or have difficulty hearing?: Yes Does the patient have difficulty seeing, even when wearing glasses/contacts?: No Does the patient have difficulty concentrating, remembering, or making decisions?: No Does the patient have difficulty walking or climbing stairs?: No Does the patient have difficulty dressing or bathing?:  No Does the patient have difficulty doing errands alone such as visiting a doctor's office or shopping?: No   Assessment & Plan:   1. COPD with acute exacerbation (HCC) Symptoms consistent with COPD exacerbation secondary to URI symptoms suspect from allergies Although patient is having financial difficulties encouraged her to do whatever she could to find $12 to pay for $3 co-pay on her steroid, antibiotic, nebs and get allergy meds to prevent continued exacerbations. She is interested in trying to get Trelegy although do not believe this is on her preferred drug list we may be able to do prior authorization.  She has been doing fairly well with the Symbicort with improved daily symptoms, she is able to ambulate further before she has shortness of breath she is having less wheeze  If Trelegy is still not approved will continue on Symbicort try and get DuoNebs to use 3 times daily as needed right now while she has an exacerbation encouraged her to try and use it before going to bed and Versed in the morning to help with her worsening symptoms when supine Refill on albuterol inhaler again to use  as needed reviewed use and dosing frequency  - doxycycline (VIBRAMYCIN) 100 MG capsule; Take 1 capsule (100 mg total) by mouth 2 (two) times daily.  Dispense: 20 capsule; Refill: 0 - predniSONE (DELTASONE) 20 MG tablet; 3 tabs poqday 1-3, 2 tabs poqday 4-6, 1 tab poqday 7-9  Dispense: 18 tablet; Refill: 0 - Respiratory Therapy Supplies (NEBULIZER/TUBING/MOUTHPIECE) KIT; Disp one nebulizer machine, tubing set and mouthpiece kit  Dispense: 1 kit; Refill: 0 - ipratropium-albuterol (DUONEB) 0.5-2.5 (3) MG/3ML SOLN; Take 3 mLs by nebulization 3 (three) times daily as needed.  Dispense: 360 mL; Refill: 1  2. Subacute sinusitis, unspecified location Several weeks to 1 to 2 months of worsening sinus congestion and drainage with recent headaches facial pain and pressure, will treat for bacterial sinusitis with  duration and worsening symptoms to with doxycycline - doxycycline (VIBRAMYCIN) 100 MG capsule; Take 1 capsule (100 mg total) by mouth 2 (two) times daily.  Dispense: 20 capsule; Refill: 0  3. Rhinosinusitis Patient has had her windows open likes the fresh air but she reports worsening symptoms of the last month or 2 which I suspect her spring allergies that she has not treating or managing at all except with NyQuil I encouraged her to start daily treatment with steroid nasal spray and 24-hour nondrowsy antihistamine and to continue doing this for the next few months.  Also encouraged her to use saline rinses and saline nasal spray  - fluticasone (FLONASE) 50 MCG/ACT nasal spray; Place 2 sprays into both nostrils daily.  Dispense: 16 g; Refill: 6 - cetirizine (ZYRTEC) 10 MG tablet; Take 1 tablet (10 mg total) by mouth at bedtime.  Dispense: 30 tablet; Refill: 11   4. Screen for colon cancer - Ambulatory referral to Gastroenterology  5. Chronic obstructive pulmonary disease, unspecified COPD type (HCC) - albuterol (VENTOLIN HFA) 108 (90 Base) MCG/ACT inhaler; Inhale 2 puffs into the lungs every 4 (four) hours as needed for wheezing or shortness of breath.  Dispense: 18 g; Refill: 5 - Fluticasone-Umeclidin-Vilant (TRELEGY ELLIPTA) 100-62.5-25 MCG/INH AEPB; Inhale 1 puff into the lungs daily. Rinse out mouth and spit after each use  Dispense: 28 each; Refill: 11 - Respiratory Therapy Supplies (NEBULIZER/TUBING/MOUTHPIECE) KIT; Disp one nebulizer machine, tubing set and mouthpiece kit  Dispense: 1 kit; Refill: 0 - ipratropium-albuterol (DUONEB) 0.5-2.5 (3) MG/3ML SOLN; Take 3 mLs by nebulization 3 (three) times daily as needed.  Dispense: 360 mL; Refill: 1  6. Encounter for medication monitoring  - Pt will get f/up appt for the dx listed below -due to transportation issues she is obtaining her labs today although all diagnoses were not fully addressed.  Her blood pressure was at goal, she appeared  euvolemic did not look fluid overloaded and although she is having worsening breathing symptoms at night I do believe this is from COPD exacerbation and not a CHF exacerbation  - CBC with Differential/Platelet - COMPLETE METABOLIC PANEL WITH GFR - Lipid panel - TSH  7. Other specified hypothyroidism - TSH  8. Mixed hyperlipidemia - COMPLETE METABOLIC PANEL WITH GFR - Lipid panel  9. CKD (chronic kidney disease), stage IV (HCC) - COMPLETE METABOLIC PANEL WITH GFR  10. Essential hypertension, malignant well controlled and BP at goal today - COMPLETE METABOLIC PANEL WITH GFR  11. Vaccine counseling Lengthy discussion on Covid vaccine options and patient's past medical history.  Encouraged her to get a Pfizer or [employer] vaccine due to effectiveness - pt is at moderate to high risk with chronic lung disease kidney  disease heart disease and current smoking We reviewed places which she could sign up for vaccine or see where they are offered  12. Back skin lesion Appears improved -no dry raised tissue does appear to be healing, patient did not follow-up with dermatology, encouraged her to continue to monitor that area and get into dermatology if there is any recurrence or worsening of the skin lesion   Return for phone call f/up on chronic conditions 3-4 weeks.   Delsa Grana, PA-C 09/02/19 10:06 AM

## 2019-09-03 ENCOUNTER — Other Ambulatory Visit: Payer: Self-pay

## 2019-09-03 DIAGNOSIS — J441 Chronic obstructive pulmonary disease with (acute) exacerbation: Secondary | ICD-10-CM

## 2019-09-03 LAB — CBC WITH DIFFERENTIAL/PLATELET
Absolute Monocytes: 322 cells/uL (ref 200–950)
Basophils Absolute: 28 cells/uL (ref 0–200)
Basophils Relative: 0.4 %
Eosinophils Absolute: 140 cells/uL (ref 15–500)
Eosinophils Relative: 2 %
HCT: 44.3 % (ref 35.0–45.0)
Hemoglobin: 14.6 g/dL (ref 11.7–15.5)
Lymphs Abs: 1512 cells/uL (ref 850–3900)
MCH: 29.7 pg (ref 27.0–33.0)
MCHC: 33 g/dL (ref 32.0–36.0)
MCV: 90.2 fL (ref 80.0–100.0)
MPV: 10.2 fL (ref 7.5–12.5)
Monocytes Relative: 4.6 %
Neutro Abs: 4998 cells/uL (ref 1500–7800)
Neutrophils Relative %: 71.4 %
Platelets: 225 10*3/uL (ref 140–400)
RBC: 4.91 10*6/uL (ref 3.80–5.10)
RDW: 15.4 % — ABNORMAL HIGH (ref 11.0–15.0)
Total Lymphocyte: 21.6 %
WBC: 7 10*3/uL (ref 3.8–10.8)

## 2019-09-03 LAB — LIPID PANEL
Cholesterol: 140 mg/dL (ref <200)
HDL: 62 mg/dL (ref >=50)
LDL Cholesterol (Calc): 62 mg/dL (calc)
Non-HDL Cholesterol (Calc): 78 mg/dL (calc) (ref <130)
Total CHOL/HDL Ratio: 2.3 (calc) (ref <5.0)
Triglycerides: 80 mg/dL (ref <150)

## 2019-09-03 LAB — COMPLETE METABOLIC PANEL WITH GFR
AG Ratio: 1.8 (calc) (ref 1.0–2.5)
ALT: 25 U/L (ref 6–29)
AST: 18 U/L (ref 10–35)
Albumin: 4.4 g/dL (ref 3.6–5.1)
Alkaline phosphatase (APISO): 125 U/L (ref 37–153)
BUN/Creatinine Ratio: 16 (calc) (ref 6–22)
BUN: 22 mg/dL (ref 7–25)
CO2: 26 mmol/L (ref 20–32)
Calcium: 9.7 mg/dL (ref 8.6–10.4)
Chloride: 104 mmol/L (ref 98–110)
Creat: 1.4 mg/dL — ABNORMAL HIGH (ref 0.50–1.05)
GFR, Est African American: 51 mL/min/{1.73_m2} — ABNORMAL LOW (ref >=60)
GFR, Est Non African American: 44 mL/min/{1.73_m2} — ABNORMAL LOW (ref >=60)
Globulin: 2.5 g/dL (calc) (ref 1.9–3.7)
Glucose, Bld: 76 mg/dL (ref 65–99)
Potassium: 4.6 mmol/L (ref 3.5–5.3)
Sodium: 140 mmol/L (ref 135–146)
Total Bilirubin: 0.5 mg/dL (ref 0.2–1.2)
Total Protein: 6.9 g/dL (ref 6.1–8.1)

## 2019-09-03 LAB — TSH: TSH: 25.16 mIU/L — ABNORMAL HIGH

## 2019-09-03 MED ORDER — LEVOTHYROXINE SODIUM 112 MCG PO TABS: 112.0000 ug | ORAL_TABLET | Freq: Every day | ORAL | 3 refills | Status: DC

## 2019-09-03 MED ORDER — MOMETASONE FURO-FORMOTEROL FUM 200-5 MCG/ACT IN AERO: 2.0000 | INHALATION_SPRAY | Freq: Two times a day (BID) | RESPIRATORY_TRACT | Status: AC

## 2019-09-03 NOTE — Telephone Encounter (Signed)
-----   Message from Delsa Grana, [place] sent at 09/03/2019 12:35 PM EDT ----- Please notify pt of labwork  Everything looked good except for kidney function we will watch - she needs to work on pushing fluids - and we'll monitor her kidney function - just down a little from last labs- may be cause she hasn't been feeling well  Her thyroid lab was abnormal.   Can you ask how she takes her levothyroxine - daily? What time? With food? Other pills?  We will need to adjust meds or med administration to improve her TSH - right now looks like she is not getting enough thyroid hormone.     Please let me know so I can change/adjust med   She will need 6 week f/up on TSH - come into office if she has other concerns or sx, if feeling good can do lab only

## 2019-09-03 NOTE — Addendum Note (Signed)
Addended by: Delsa Grana on: 09/03/2019 03:33 PM   Modules accepted: Orders

## 2019-10-08 ENCOUNTER — Ambulatory Visit: Payer: Medicaid Other | Admitting: Family Medicine

## 2019-10-14 DIAGNOSIS — Z419 Encounter for procedure for purposes other than remedying health state, unspecified: Secondary | ICD-10-CM | POA: Diagnosis not present

## 2019-10-18 ENCOUNTER — Other Ambulatory Visit: Payer: Self-pay | Admitting: Family Medicine

## 2019-10-18 DIAGNOSIS — F411 Generalized anxiety disorder: Secondary | ICD-10-CM

## 2019-10-18 DIAGNOSIS — F3341 Major depressive disorder, recurrent, in partial remission: Secondary | ICD-10-CM

## 2019-11-14 DIAGNOSIS — Z419 Encounter for procedure for purposes other than remedying health state, unspecified: Secondary | ICD-10-CM | POA: Diagnosis not present

## 2019-12-15 DIAGNOSIS — Z419 Encounter for procedure for purposes other than remedying health state, unspecified: Secondary | ICD-10-CM | POA: Diagnosis not present

## 2019-12-28 ENCOUNTER — Telehealth: Payer: Self-pay | Admitting: Cardiovascular Disease

## 2019-12-28 ENCOUNTER — Telehealth (INDEPENDENT_AMBULATORY_CARE_PROVIDER_SITE_OTHER): Payer: Medicaid Other | Admitting: Cardiovascular Disease

## 2019-12-28 ENCOUNTER — Encounter: Payer: Self-pay | Admitting: Cardiovascular Disease

## 2019-12-28 VITALS — BP 149/92 | HR 77 | Ht 61.0 in | Wt 165.0 lb

## 2019-12-28 DIAGNOSIS — I701 Atherosclerosis of renal artery: Secondary | ICD-10-CM | POA: Diagnosis not present

## 2019-12-28 DIAGNOSIS — I10 Essential (primary) hypertension: Secondary | ICD-10-CM | POA: Diagnosis not present

## 2019-12-28 DIAGNOSIS — I5022 Chronic systolic (congestive) heart failure: Secondary | ICD-10-CM

## 2019-12-28 MED ORDER — ENTRESTO 24-26 MG PO TABS: 1.0000 | ORAL_TABLET | Freq: Two times a day (BID) | ORAL | 5 refills | Status: DC

## 2019-12-28 NOTE — Patient Instructions (Signed)
Medication Instructions:  Your physician has recommended you make the following change in your medication:   STOP Hydralazine  START Entresto 24/26 mg twice daily. An Rx has been sent to your pharmacy  *If you need a refill on your cardiac medications before your next appointment, please call your pharmacy*   Lab Work: Your physician recommends that you return for lab work (bmet) 1 week after starting [employer].  Please have your lab drawn at the [facility] medical mall. Lab hours are Mon-Fri 7:30am-6pm.   If you have labs (blood work) drawn today and your tests are completely normal, you will receive your results only by: Marland Kitchen MyChart Message (if you have MyChart) OR . A paper copy in the mail If you have any lab test that is abnormal or we need to change your treatment, we will call you to review the results.   Testing/Procedures: Your physician has requested that you have a renal artery duplex. During this test, an ultrasound is used to evaluate blood flow to the kidneys. Allow one hour for this exam. Do not eat after midnight the day before and avoid carbonated beverages. Take your medications as you usually do.     Follow-Up: At [facility], you and your health needs are our priority.  As part of our continuing mission to provide you with exceptional heart care, we have created designated Provider Care Teams.  These Care Teams include your primary Cardiologist (physician) and Advanced Practice Providers (APPs -  Physician Assistants and Nurse Practitioners) who all work together to provide you with the care you need, when you need it.  We recommend signing up for the patient portal called "MyChart".  Sign up information is provided on this After Visit Summary.  MyChart is used to connect with patients for Virtual Visits (Telemedicine).  Patients are able to view lab/test results, encounter notes, upcoming appointments, etc.  Non-urgent messages can be sent to your provider as well.   To  learn more about what you can do with MyChart, go to [email or web address].    Your next appointment:   4 month(s)  The format for your next appointment:   In Person  Provider:    You may see Kathlyn Sacramento, MD or one of the following Advanced Practice Providers on your designated Care Team:    Murray Hodgkins, NP  [name], PA-C  Marrianne Mood, PA-C    Other Instructions N/A

## 2019-12-28 NOTE — Telephone Encounter (Signed)
  Patient Consent for Virtual Visit         Angelica Herrera has provided verbal consent on 12/28/2019 for a virtual visit (video or telephone).   CONSENT FOR VIRTUAL VISIT FOR:  Angelica Herrera  By participating in this virtual visit I agree to the following:  I hereby voluntarily request, consent and authorize [facility] and its employed or contracted physicians, physician assistants, nurse practitioners or other licensed health care professionals (the Practitioner), to provide me with telemedicine health care services (the "Services") as deemed necessary by the treating Practitioner. I acknowledge and consent to receive the Services by the Practitioner via telemedicine. I understand that the telemedicine visit will involve communicating with the Practitioner through live audiovisual communication technology and the disclosure of certain medical information by electronic transmission. I acknowledge that I have been given the opportunity to request an in-person assessment or other available alternative prior to the telemedicine visit and am voluntarily participating in the telemedicine visit.  I understand that I have the right to withhold or withdraw my consent to the use of telemedicine in the course of my care at any time, without affecting my right to future care or treatment, and that the Practitioner or I may terminate the telemedicine visit at any time. I understand that I have the right to inspect all information obtained and/or recorded in the course of the telemedicine visit and may receive copies of available information for a reasonable fee.  I understand that some of the potential risks of receiving the Services via telemedicine include:  Marland Kitchen Delay or interruption in medical evaluation due to technological equipment failure or disruption; . Information transmitted may not be sufficient (e.g. poor resolution of images) to allow for appropriate medical decision making by the  Practitioner; and/or  . In rare instances, security protocols could fail, causing a breach of personal health information.  Furthermore, I acknowledge that it is my responsibility to provide information about my medical history, conditions and care that is complete and accurate to the best of my ability. I acknowledge that Practitioner's advice, recommendations, and/or decision may be based on factors not within their control, such as incomplete or inaccurate data provided by me or distortions of diagnostic images or specimens that may result from electronic transmissions. I understand that the practice of medicine is not an exact science and that Practitioner makes no warranties or guarantees regarding treatment outcomes. I acknowledge that a copy of this consent can be made available to me via my patient portal ([place]), or I can request a printed copy by calling the office of [facility].    I understand that my insurance will be billed for this visit.   I have read or had this consent read to me. . I understand the contents of this consent, which adequately explains the benefits and risks of the Services being provided via telemedicine.  . I have been provided ample opportunity to ask questions regarding this consent and the Services and have had my questions answered to my satisfaction. . I give my informed consent for the services to be provided through the use of telemedicine in my medical care

## 2019-12-28 NOTE — Progress Notes (Signed)
Virtual Visit via Telephone Note   This visit type was conducted due to national recommendations for restrictions regarding the COVID-19 Pandemic (e.g. social distancing) in an effort to limit this patient's exposure and mitigate transmission in our community.  Due to her co-morbid illnesses, this patient is at least at moderate risk for complications without adequate follow up.  This format is felt to be most appropriate for this patient at this time.  The patient did not have access to video technology/had technical difficulties with video requiring transitioning to audio format only (telephone).  All issues noted in this document were discussed and addressed.  No physical exam could be performed with this format.  Please refer to the patient's chart for her  consent to telehealth for [facility].    Date:  12/28/2019   ID:  Angelica Herrera, DOB [birth year], MRN [id number] The patient was identified using 2 identifiers.  Patient Location: Home Provider Location: Home Office  PCP:  Delsa Grana, PA-C  Cardiologist:  Kathlyn Sacramento, MD  Electrophysiologist:  None   Evaluation Performed:  Follow-Up Visit  Chief Complaint: Doing well with no complaints at the present time.  History of Present Illness:    Angelica Herrera is a 49 y.o. female was reached via phone for a follow-up visit regarding coronary artery disease and chronic systolic heart failure.  She presented in October 2013 with acute anterolateral ST elevation myocardial infarction. She underwent emergent cardiac catheterization  which showed flush occlusion of left main coronary artery. There was significant difficulty in engaging the left main. Left main balloon angioplasty followed by drug-eluting stent placement was performed. She also had balloon angioplasty done to the LAD due to embolization from the left main. The patient had significant hemodynamic instability with ventricular tachycardia and hypotension.   She is  a smoker. She had atypical chest pain in 2017. A pharmacologic nuclear stress test  showed prior anterior infarct with mild peri-infarct ischemia and  ejection fraction of 33%.  Cardiac catheterization at that time showed patent left main stent with moderate mid LAD stenosis and occluded second diagonal. Ejection fraction was 35-40% with anterior wall hypokinesis. She has history of renal artery stenosis status post left renal artery stenting in August of last year.  In addition, she has chronic kidney disease, hyperlipidemia and tobacco use. Most recent echocardiogram in November of last year showed an EF of 35 to 40% with moderate pulmonary hypertension. She has been doing reasonably well and denies chest pain or worsening dyspnea.  No significant leg edema.   The patient does not have symptoms concerning for COVID-19 infection (fever, chills, cough, or new shortness of breath).    Past Medical History:  Diagnosis Date  . Arthritis    gout  . CAD (coronary artery disease)    a. 02/2012 s/p anterolateral STEMI/Cath/PCI: LM 100 (3.0x15 DES ), LAD sm, severely diseased (PTCA w/ 1.71m balloon), LCX nl, RI moderate size, nl, RCA 30-440mAM/PDA/PLA nl, R->L collats noted; b. cath 08/03/15: widely patent ostLM stent at 10%, mLAD 60%,  mRCA 30%, EF 35-40%, ant wall HK, mildly elevated LVEDP; c. 02/2017 MV: ant infarct, mild peri-infarct isch (similar to 2017 study)->Med Rx.  . Chronic combined systolic (congestive) and diastolic (congestive) heart failure ([place]   a. 02/2017 Echo: EF 30-35%, diff HK, sev HK/poss AK of ant and lat walls. Gr1 DD. Mild to mod MR. Nl RV fxn. Mod TR.  . CKD (chronic kidney disease), stage IV ([place]  .  HTN (hypertension)   . Hyperlipidemia   . Hypothyroidism   . Ischemic cardiomyopathy    a. 02/2012 Echo: EF 40-45%, ant/lat HK, Gr1 DD, Mild MR, PASP 40mHg; b. 02/2017 Echo: EF 30-35%.  . NSVT (nonsustained ventricular tachycardia) ([place]    a. 02/2012 post-MI  . Obesity    . Pancreatitis   . S/P primary angioplasty with coronary stent 03/04/2018   Dual antiplatelet therapy INDEFINITELY per cardiology note Nov 2019  . Tobacco abuse    Past Surgical History:  Procedure Laterality Date  . ABDOMINAL HYSTERECTOMY    . CARDIAC CATHETERIZATION  2013   [facility] s/p stent  . CARDIAC CATHETERIZATION Left 08/03/2015   Procedure: Left Heart Cath and Coronary Angiography;  Surgeon: MWellington Hampshire MD;  [facility];  Service: Cardiovascular;  Laterality: Left;  . DILATION AND CURETTAGE OF UTERUS    . LEFT HEART CATHETERIZATION WITH CORONARY ANGIOGRAM N/A 02/11/2012   Procedure: LEFT HEART CATHETERIZATION WITH CORONARY ANGIOGRAM;  Surgeon: MSherren Mocha MD;  [facility];  Service: Cardiovascular;  Laterality: N/A;  . RENAL INTERVENTION N/A 11/17/2018   Procedure: RENAL INTERVENTION;  Surgeon: SKatha Cabal MD;  [facility];  Service: Cardiovascular;  Laterality: N/A;     Current Meds  Medication Sig  . albuterol (VENTOLIN HFA) 108 (90 Base) MCG/ACT inhaler Inhale 2 puffs into the lungs every 4 (four) hours as needed for wheezing or shortness of breath.  .Marland Kitchenaspirin 81 MG tablet Take 1 tablet (81 mg total) by mouth daily.  .Marland Kitchenatorvastatin (LIPITOR) 80 MG tablet Take 1 tablet (80 mg total) by mouth at bedtime.  . carvedilol (COREG) 12.5 MG tablet Take 1 tablet (12.5 mg total) by mouth 2 (two) times daily with a meal.  . cetirizine (ZYRTEC) 10 MG tablet Take 1 tablet (10 mg total) by mouth at bedtime.  . clopidogrel (PLAVIX) 75 MG tablet TAKE (1) TABLET BY MOUTH EVERY DAY  . Fluticasone-Umeclidin-Vilant (TRELEGY ELLIPTA) 100-62.5-25 MCG/INH AEPB Inhale 1 puff into the lungs daily. Rinse out mouth and spit after each use  . furosemide (LASIX) 20 MG tablet TAKE (1) TABLET BY MOUTH EVERY DAY  . gabapentin (NEURONTIN) 300 MG capsule TAKE (1) CAPSULE BY MOUTH TWICE DAILY  . hydrALAZINE (APRESOLINE) 25 MG tablet Take 1 tablet (25  mg total) by mouth in the morning and at bedtime.  .Marland Kitchenipratropium-albuterol (DUONEB) 0.5-2.5 (3) MG/3ML SOLN Take 3 mLs by nebulization 3 (three) times daily as needed.  .Marland Kitchenlevothyroxine (SYNTHROID) 112 MCG tablet Take 1 tablet (112 mcg total) by mouth daily before breakfast.  . nitroGLYCERIN (NITROSTAT) 0.4 MG SL tablet Place 1 tablet (0.4 mg total) under the tongue every 5 (five) minutes x 3 doses as needed for chest pain.  .Marland KitchenPARoxetine (PAXIL) 40 MG tablet TAKE ONE (1) TABLET BY MOUTH ONCE DAILY  . Respiratory Therapy Supplies (NEBULIZER/TUBING/MOUTHPIECE) KIT Disp one nebulizer machine, tubing set and mouthpiece kit   Current Facility-Administered Medications for the 12/28/19 encounter (Telemedicine) with AWellington Hampshire MD  Medication  . mometasone-formoterol (DULERA) 200-5 MCG/ACT inhaler 2 puff     Allergies:   Patient has no known allergies.   Social History   Tobacco Use  . Smoking status: Current Every Day Smoker    Packs/day: 0.25    Years: 30.00    Pack years: 7.50    Types: Cigarettes  . Smokeless tobacco: Never Used  Vaping Use  . Vaping Use: Never used  Substance Use  Topics  . Alcohol use: Not Currently    Comment: Says she previously drank socially - never a heavy drinker.  No etoh since 08/2018.  Marland Kitchen Drug use: No    Types: Marijuana     Family Hx: The patient's family history includes AAA (abdominal aortic aneurysm) in her father; Cancer in her paternal grandfather; Diabetes in her mother; Hyperlipidemia in her mother; Hypertension in her brother, brother, daughter, father, mother, sister, and sister; Kidney disease in her mother; Supraventricular tachycardia in her sister; Thyroid disease in her father.  ROS:   Please see the history of present illness.     All other systems reviewed and are negative.   Prior CV studies:   The following studies were reviewed today:  Reviewed most recent echocardiogram with her.  Labs/Other Tests and Data Reviewed:     EKG:  No ECG reviewed.  Recent Labs: 09/02/2019: ALT 25; BUN 22; Creat 1.40; Hemoglobin 14.6; Platelets 225; Potassium 4.6; Sodium 140; TSH 25.16   Recent Lipid Panel Lab Results  Component Value Date/Time   CHOL 140 09/02/2019 10:30 AM   CHOL 136 02/27/2018 12:07 PM   TRIG 80 09/02/2019 10:30 AM   HDL 62 09/02/2019 10:30 AM   HDL 53 02/27/2018 12:07 PM   CHOLHDL 2.3 09/02/2019 10:30 AM   LDLCALC 62 09/02/2019 10:30 AM    Wt Readings from Last 3 Encounters:  12/28/19 165 lb (74.8 kg)  09/02/19 169 lb 14.4 oz (77.1 kg)  08/25/19 164 lb 4 oz (74.5 kg)     Objective:    Vital Signs:  BP (!) 149/92   Pulse 77   Ht 5' 1"  (1.549 m)   Wt 165 lb (74.8 kg)   BMI 31.18 kg/m    VITAL SIGNS:  reviewed  ASSESSMENT & PLAN:     1.  Coronary artery disease involving native coronary arteries with other forms of angina: She is doing reasonably well overall with stable symptoms.  Continue medical therapy.  She is on long-term dual antiplatelet therapy.  2. Chronic systolic heart failure: Due to ischemic cardiomyopathy with most recent ejection fraction 35-40%.  She is currently on carvedilol and hydralazine.  Most recent creatinine was 1.4.  I elected to switch hydralazine to Entresto.  Check basic metabolic profile in 1 week.  3. Essential hypertension: Blood pressure is mildly elevated.  Hydralazine was switched to [facility] as outlined above.  4. Hyperlipidemia: Continue high-dose atorvastatin.  Most recent lipid profile showed an LDL of 62.  5. Tobacco use: I discussed with her the importance of cessation. She reports being under significant stress.  6. Chronic kidney disease: Stable.  7.  Status post left renal artery stent: I requested a follow-up renal artery duplex.    COVID-19 Education: The signs and symptoms of COVID-19 were discussed with the patient and how to seek care for testing (follow up with PCP or arrange E-visit).  The importance of social distancing was  discussed today.  Time:   Today, I have spent 10 minutes with the patient with telehealth technology discussing the above problems.     Medication Adjustments/Labs and Tests Ordered: Current medicines are reviewed at length with the patient today.  Concerns regarding medicines are outlined above.   Tests Ordered: No orders of the defined types were placed in this encounter.   Medication Changes: No orders of the defined types were placed in this encounter.   Follow Up:  In Person in 4 month(s)  Signed, Kathlyn Sacramento, MD  12/28/2019 4:38 PM    [facility]

## 2019-12-29 ENCOUNTER — Telehealth: Payer: Self-pay

## 2019-12-29 NOTE — Telephone Encounter (Signed)
Fax received from [employer]. Prior Authorization required for Entresto 24-26mg  tablets. PA completed and sent to [facility] in [email or web address] KEY: [id number] PA Case ID: [id number]  Waiting for determination.

## 2019-12-29 NOTE — Telephone Encounter (Addendum)
Contacted the patient to f/u on her 12/28/19 telemedicine appt with Dr. Fletcher Anon.  Reviewed Dr. Tyrell Antonio recommendation  STOP Hydralazine START Entresto 24/26 mg twice daily. An Rx has been sent to the patients pharmacy. Bmet 1 week after starting Entresto. Renal artery duplex F/u in 4 months.  Advised the patient a copy of her AVS has been mailed to her home. Patient verbalized understanding and has no questions or concerns at this time.  Patients 4 mo fu has been scheduled but the renal artery duplex has not. Adv the patient that I will fwd the message to scheduling to contact to schedule the test.

## 2019-12-29 NOTE — Telephone Encounter (Signed)
Attempted to schedule lmov  

## 2020-01-04 NOTE — Telephone Encounter (Signed)
PA denial received from [facility].  Faxed a rqst for an appeal for Entresto. Included Dr. Tyrell Antonio most recent 12/28/19 telemedicine to help support the appeal.  Will await a response/decision.

## 2020-01-10 NOTE — Telephone Encounter (Signed)
Fax received from [facility]. Patient must sign an appeal request form authorizing [place] to file an appeal on her behalf for [facility].  Spoke with the patient and made her aware. Patient sts that she will stop by our office tomorrow to sign the form.  Form left at the front desk.

## 2020-01-14 DIAGNOSIS — Z419 Encounter for procedure for purposes other than remedying health state, unspecified: Secondary | ICD-10-CM | POA: Diagnosis not present

## 2020-01-19 NOTE — Telephone Encounter (Signed)
Attempted to schedule no ans no vm  

## 2020-01-21 MED ORDER — LOSARTAN POTASSIUM 50 MG PO TABS
50.0000 mg | ORAL_TABLET | Freq: Every day | ORAL | 1 refills | Status: DC
Start: 2020-01-21 — End: 2020-02-22

## 2020-01-21 NOTE — Telephone Encounter (Addendum)
Spoke with the patient. Patient rqst that the appeal letter to be signed be mailed to her. Placed the [employer] appeal auth letter in the mail. Patient is to sign it and return it to our office. Adv the patient that it is time sensitive.  Patient made aware of Dr. Tyrell Antonio recommendation for her start Losartan 50 mg daily until we can get Entresto approved. Rx has been sent to the patients pharmacy.

## 2020-01-21 NOTE — Addendum Note (Signed)
Addended by: Lamar Laundry on: 01/21/2020 12:21 PM   Modules accepted: Orders

## 2020-01-21 NOTE — Telephone Encounter (Signed)
Patient did not show to sign the authorization letter to allow [place] to appeal her insurances denial for [facility].  Called the patient Angelica Herrera.

## 2020-01-21 NOTE — Telephone Encounter (Signed)
we can use losartan 50 mg once daily until we are able to get her on Entresto.

## 2020-01-24 NOTE — Telephone Encounter (Signed)
Attempted to schedule no ans no vm  

## 2020-02-08 NOTE — Telephone Encounter (Signed)
Patient dropped off signed form Placed in nurse box

## 2020-02-09 NOTE — Telephone Encounter (Signed)
Appeal rqst and documentation faxed to [facility].

## 2020-02-14 DIAGNOSIS — Z419 Encounter for procedure for purposes other than remedying health state, unspecified: Secondary | ICD-10-CM | POA: Diagnosis not present

## 2020-02-16 ENCOUNTER — Other Ambulatory Visit: Payer: Self-pay

## 2020-02-16 ENCOUNTER — Other Ambulatory Visit: Payer: Self-pay | Admitting: Physician Assistant

## 2020-02-16 ENCOUNTER — Telehealth: Payer: Self-pay | Admitting: Physician Assistant

## 2020-02-16 DIAGNOSIS — E785 Hyperlipidemia, unspecified: Secondary | ICD-10-CM

## 2020-02-16 DIAGNOSIS — I2510 Atherosclerotic heart disease of native coronary artery without angina pectoris: Secondary | ICD-10-CM

## 2020-02-16 MED ORDER — CARVEDILOL 12.5 MG PO TABS: 12.5000 mg | ORAL_TABLET | Freq: Two times a day (BID) | ORAL | 1 refills | Status: DC

## 2020-02-16 MED ORDER — ATORVASTATIN CALCIUM 80 MG PO TABS: ORAL_TABLET | ORAL | 3 refills | Status: DC

## 2020-02-16 NOTE — Telephone Encounter (Signed)
This is a [place] pt, Dr. Arida °

## 2020-02-16 NOTE — Telephone Encounter (Signed)
Rx request sent to pharmacy.  

## 2020-02-16 NOTE — Telephone Encounter (Signed)
Received fax from [facility] requesting refill for Atorvastatin 80 mg. Rx request sent to pharmacy.

## 2020-02-17 ENCOUNTER — Telehealth: Payer: Self-pay | Admitting: Cardiovascular Disease

## 2020-02-17 NOTE — Telephone Encounter (Signed)
Correct tel# for [place] appeal [phone]. Ref call # [id number]  Spoke with a representative in reference to the appeal for Entresto and the additional questions were answered. -Patients CHF dx -Patients current EF -Is the patient a diabetic -Is the patient on a Ace or Arb  Requested info has been update on the patients Entresto appeal. Jackquline Denmark will process and fax their decision.

## 2020-02-17 NOTE — Telephone Encounter (Addendum)
Correct tel# for [place] appeal [phone]. Ref call # [id number]  Spoke with a representative in reference to the appeal for Entresto and the additional questions were answered. - Confirmed patients CHF dx -Patients current EF -Is the patient a diabetic -Is the patient on a Ace or Arb  Requested info has been updated on the patients Entresto appeal. Jackquline Denmark will process and fax their decision.

## 2020-02-17 NOTE — Telephone Encounter (Signed)
Angelica Herrera with [facility] calling  Needs additional clinical information for pre authorization  Please call [phone]

## 2020-02-18 ENCOUNTER — Ambulatory Visit: Payer: Medicaid Other | Admitting: Internal Medicine

## 2020-02-18 ENCOUNTER — Encounter: Payer: Self-pay | Admitting: Internal Medicine

## 2020-02-18 ENCOUNTER — Other Ambulatory Visit: Payer: Self-pay

## 2020-02-18 ENCOUNTER — Ambulatory Visit: Payer: Self-pay

## 2020-02-18 ENCOUNTER — Telehealth: Payer: Self-pay

## 2020-02-18 VITALS — BP 122/86 | HR 78 | Temp 98.0°F | Resp 18 | Ht 66.0 in | Wt 177.0 lb

## 2020-02-18 DIAGNOSIS — F17200 Nicotine dependence, unspecified, uncomplicated: Secondary | ICD-10-CM

## 2020-02-18 DIAGNOSIS — F418 Other specified anxiety disorders: Secondary | ICD-10-CM

## 2020-02-18 DIAGNOSIS — R1011 Right upper quadrant pain: Secondary | ICD-10-CM

## 2020-02-18 DIAGNOSIS — R1012 Left upper quadrant pain: Secondary | ICD-10-CM

## 2020-02-18 DIAGNOSIS — N1832 Chronic kidney disease, stage 3b: Secondary | ICD-10-CM | POA: Insufficient documentation

## 2020-02-18 DIAGNOSIS — R109 Unspecified abdominal pain: Secondary | ICD-10-CM | POA: Diagnosis not present

## 2020-02-18 DIAGNOSIS — E038 Other specified hypothyroidism: Secondary | ICD-10-CM

## 2020-02-18 HISTORY — DX: Nicotine dependence, unspecified, uncomplicated: F17.200

## 2020-02-18 HISTORY — DX: Chronic kidney disease, stage 3b: N18.32

## 2020-02-18 HISTORY — DX: Other specified anxiety disorders: F41.8

## 2020-02-18 NOTE — Telephone Encounter (Signed)
Patient saw Dr.Hendrickson

## 2020-02-18 NOTE — Telephone Encounter (Signed)
Patient was informed that she has been scheduled to have her ultrasound on Friday, February 25, 2020 at 10:30 at [facility]. Patient was instructed not to have anything to eat or drink after midnight and to arrive by 10 am a the the medical mall entrance. She was also told that as soon as she finishes that scan to start drinking 32 oz of water (w/o using the restroom) to prepare for her 2nd scan at 11am (same location).   She expressed verbal understanding and said thanks.

## 2020-02-18 NOTE — Patient Instructions (Signed)
Please obtain the ultrasound studies ordered today.

## 2020-02-18 NOTE — Progress Notes (Signed)
Patient ID: Angelica Herrera, female    DOB: 02/21/1971, 49 y.o.   MRN: [id number]  PCP: Delsa Grana, PA-C  Chief Complaint  Patient presents with  . Abdominal Pain    x1 month    Subjective:   Angelica Herrera is a 49 y.o. female, presents to clinic with CC of the following:  Chief Complaint  Patient presents with  . Abdominal Pain    x1 month    HPI:  Patient is a 49 year old female patient of Delsa Grana Last visit with her was in May 2021 She has history of accelerated hypertension, coronary artery disease status post stent, chronic systolic heart failure, hyperlipidemia, CKD, COPD, and she does see cardiology and vascular specialist and is on Plavix. At her last visit with [facility], it was noted that she is having to space out her medications and states that today all the medicine prescribed she would not be able to get for another 2 to 3 weeks due to getting paid once a month she cannot afford the co-pays on her medicines and she is having difficulty with transportation as well.  Message from the triage nurse this morning is as follows: Pt. Reports she has had abdominal pain x 1 month. Gradual. Pain is to the right of belly button. Has noticed mucus in stool. No nausea,diarrhea or vomiting. Pain comes and goes. Appointment made for today. Appointment was scheduled for her today.  She notes the abdominal pain started about a month ago, Has been constant, intermittently worse Pain described as sharp at times, mostly dull  Location of pain is above and right of the bellybutton and radiates toward the side, occas feels on it on the left side, same area. Notes it hurts when he touches the area. No relationship to meals No N/V,  No fevers,  No diarrhea or constipation  No blood with BM's , no dark stools, question some mucus at times in the stool. He denies any dysuria, no hematuria. No chest pains or shortness of breath. No h/o major abdominal pains Tried  tylenol, helps a little No regular NSAID use Alcohol  - occas, 3-4 days a month, not heavy  Activity level - walking some  Tobacco-current every day smoker  Last labs were reviewed, with her TSH significantly elevated in May (noted with not taking her thyroid supplement 3+ weeks at least before that lab drawn). Noted on thyroid supp now more regularly. Her kidney function was slightly elevated at that time as well.  A follow-up TSH check was ordered, although not yet obtained. She notes she has gained some weight recently. Wt Readings from Last 3 Encounters:  02/18/20 177 lb (80.3 kg)  12/28/19 165 lb (74.8 kg)  09/02/19 169 lb 14.4 oz (77.1 kg)     Patient Active Problem List   Diagnosis Date Noted  . Stage 3b chronic kidney disease ([place]) 02/18/2020  . Rhinosinusitis 09/02/2019  . Chronic obstructive pulmonary disease ([place]) 09/02/2019  . Squamous cell carcinoma of back 04/20/2019  . Accelerated hypertension 11/13/2018  . Chest pain 11/12/2018  . S/P primary angioplasty with coronary stent 03/04/2018  . Depression, recurrent ([place]) 12/02/2017  . Chronic systolic heart failure ([place]) 11/21/2017  . Controlled substance agreement signed 09/07/2015  . Coronary artery disease involving native coronary artery   . Allergic rhinitis due to pollen 06/28/2015  . GAD (generalized anxiety disorder) 03/17/2015  . Degenerative disc disease, cervical 03/17/2015  . Hyperlipidemia   . Obesity   .  CKD (chronic kidney disease), stage IV ([place])   . Ischemic cardiomyopathy   . NSVT (nonsustained ventricular tachycardia) ([place])   . Hypothyroidism   . History of acute anterior wall MI 02/12/2012  . Essential hypertension, malignant 02/12/2012  . Tobacco abuse 02/12/2012      Current Outpatient Medications:  .  albuterol (VENTOLIN HFA) 108 (90 Base) MCG/ACT inhaler, Inhale 2 puffs into the lungs every 4 (four) hours as needed for wheezing or shortness of breath., Disp: 18 g, Rfl: 5 .  aspirin 81  MG tablet, Take 1 tablet (81 mg total) by mouth daily., Disp: 30 tablet, Rfl:  .  atorvastatin (LIPITOR) 80 MG tablet, Take 1 tablet (80 mg total) by mouth at bedtime., Disp: 90 tablet, Rfl: 3 .  carvedilol (COREG) 12.5 MG tablet, Take 1 tablet (12.5 mg total) by mouth 2 (two) times daily with a meal., Disp: 180 tablet, Rfl: 1 .  cetirizine (ZYRTEC) 10 MG tablet, Take 1 tablet (10 mg total) by mouth at bedtime., Disp: 30 tablet, Rfl: 11 .  clopidogrel (PLAVIX) 75 MG tablet, TAKE (1) TABLET BY MOUTH EVERY DAY, Disp: 90 tablet, Rfl: 3 .  Fluticasone-Umeclidin-Vilant (TRELEGY ELLIPTA) 100-62.5-25 MCG/INH AEPB, Inhale 1 puff into the lungs daily. Rinse out mouth and spit after each use, Disp: 28 each, Rfl: 11 .  furosemide (LASIX) 20 MG tablet, TAKE (1) TABLET BY MOUTH EVERY DAY, Disp: 90 tablet, Rfl: 1 .  gabapentin (NEURONTIN) 300 MG capsule, TAKE (1) CAPSULE BY MOUTH TWICE DAILY, Disp: 60 capsule, Rfl: 5 .  levothyroxine (SYNTHROID) 112 MCG tablet, Take 1 tablet (112 mcg total) by mouth daily before breakfast., Disp: 90 tablet, Rfl: 3 .  losartan (COZAAR) 50 MG tablet, Take 1 tablet (50 mg total) by mouth daily., Disp: 90 tablet, Rfl: 1 .  nitroGLYCERIN (NITROSTAT) 0.4 MG SL tablet, Place 1 tablet (0.4 mg total) under the tongue every 5 (five) minutes x 3 doses as needed for chest pain., Disp: 25 tablet, Rfl: 3 .  PARoxetine (PAXIL) 40 MG tablet, TAKE ONE (1) TABLET BY MOUTH ONCE DAILY, Disp: 90 tablet, Rfl: 1 .  sacubitril-valsartan (ENTRESTO) 24-26 MG, Take 1 tablet by mouth 2 (two) times daily., Disp: 60 tablet, Rfl: 5 .  ipratropium-albuterol (DUONEB) 0.5-2.5 (3) MG/3ML SOLN, Take 3 mLs by nebulization 3 (three) times daily as needed. (Patient not taking: Reported on 02/18/2020), Disp: 360 mL, Rfl: 1 .  Respiratory Therapy Supplies (NEBULIZER/TUBING/MOUTHPIECE) KIT, Disp one nebulizer machine, tubing set and mouthpiece kit (Patient not taking: Reported on 02/18/2020), Disp: 1 kit, Rfl: 0  Current  Facility-Administered Medications:  .  mometasone-formoterol (DULERA) 200-5 MCG/ACT inhaler 2 puff, 2 puff, Inhalation, BID, Lucio Edward, Leisa, PA-C   No Known Allergies   Past Surgical History:  Procedure Laterality Date  . ABDOMINAL HYSTERECTOMY    . CARDIAC CATHETERIZATION  2013   [facility] s/p stent  . CARDIAC CATHETERIZATION Left 08/03/2015   Procedure: Left Heart Cath and Coronary Angiography;  Surgeon: Wellington Hampshire, MD;  Location: [place] [facility];  Service: Cardiovascular;  Laterality: Left;  . DILATION AND CURETTAGE OF UTERUS    . LEFT HEART CATHETERIZATION WITH CORONARY ANGIOGRAM N/A 02/11/2012   Procedure: LEFT HEART CATHETERIZATION WITH CORONARY ANGIOGRAM;  Surgeon: [name], MD;  Location: [facility] CATH LAB;  Service: Cardiovascular;  Laterality: N/A;  . RENAL INTERVENTION N/A 11/17/2018   Procedure: RENAL INTERVENTION;  Surgeon: Katha Cabal, MD;  Location: [place] [facility];  Service: Cardiovascular;  Laterality: N/A;  Family History  Problem Relation Age of Onset  . Hypertension Father   . Thyroid disease Father   . AAA (abdominal aortic aneurysm) Father   . Hypertension Mother   . Hyperlipidemia Mother   . Diabetes Mother   . Kidney disease Mother   . Hypertension Sister   . Hypertension Sister   . Supraventricular tachycardia Sister   . Hypertension Brother   . Hypertension Brother   . Hypertension Daughter   . Cancer Paternal Grandfather        lung     Social History   Tobacco Use  . Smoking status: Current Every Day Smoker    Packs/day: 0.25    Years: 30.00    Pack years: 7.50    Types: Cigarettes  . Smokeless tobacco: Never Used  Substance Use Topics  . Alcohol use: Not Currently    Comment: Says she previously drank socially - never a heavy drinker.  No etoh since 08/2018.    With staff assistance, above reviewed with the patient today.  ROS: As per HPI, otherwise no specific complaints on a limited and focused system review    No results found for this or any previous visit (from the past 72 hour(s)).   PHQ2/9: Depression screen [facility] 2/9 02/18/2020 09/02/2019 04/20/2019 03/25/2019 02/25/2019  Decreased Interest 1 0 2 0 3  Down, Depressed, Hopeless 2 0 1 0 3  PHQ - 2 Score 3 0 3 0 6  Altered sleeping 3 0 2 0 3  Tired, decreased energy 3 0 3 0 3  Change in appetite 3 0 2 0 3  Feeling bad or failure about yourself  2 0 1 0 3  Trouble concentrating 3 0 3 0 3  Moving slowly or fidgety/restless 1 0 1 0 3  Suicidal thoughts 0 0 1 0 0  PHQ-9 Score 18 0 16 0 24  Difficult doing work/chores Very difficult Not difficult at all Not difficult at all Not difficult at all Somewhat difficult  Some recent data might be hidden   PHQ-2/9 Result reviewed, history of anxiety/depression noted and on medications to help.  No thoughts of hurting self or hurting others presently.  Fall Risk: Fall Risk  02/18/2020 09/02/2019 04/20/2019 03/25/2019 02/25/2019  Falls in the past year? 0 1 1 0 0  Number falls in past yr: 0 1 0 0 0  Injury with Fall? 0 0 1 0 0  Follow up - - - - -      Objective:   Vitals:   02/18/20 1401  BP: 122/86  Pulse: 78  Resp: 18  Temp: 98 F (36.7 C)  TempSrc: Oral  SpO2: 94%  Weight: 177 lb (80.3 kg)  Height: 5' 6"  (1.676 m)    Body mass index is 28.57 kg/m.  Physical Exam   NAD, masked HEENT - [place]/AT, sclera anicteric, PERRL, EOMI, conj - non-inj'ed,  pharynx clear Neck - supple, no adenopathy,  carotids 2+ and = without bruits bilat Car - RRR with a 1 out of 6 to 2 out of 6 systolic murmur heard along the left sternal border, no gallop or rub (she notes that she has a history of a murmur previously, and this is not new when I asked) Pulm- RR and effort normal at rest, CTA without wheeze or rales Abd - soft, obese, tender on the right side adjacent to the umbilicus and up towards the right upper quadrant and extending towards the flank, nontender in the right lower quadrant  or left lower  quadrant, much less tender in the left upper quadrant with more tenderness progressing towards the left flank, no rebound or guarding present, no obvious masses palpated, no obvious HSM, bowel sounds positive, nondistended, Back -positive left CVA tenderness with the discomfort not just focally at the CVA but extending towards the left side and axillary line region., no right CVA tenderness, nontender in the lower back muscle regions bilaterally. Ext - no LE edema,  Neuro/psychiatric - affect was not flat, appropriate with conversation  Alert with normal speech  Grossly non-focal     Results for orders placed or performed in visit on 09/02/19  CBC with Differential/Platelet  Result Value Ref Range   WBC 7.0 3.8 - 10.8 Thousand/uL   RBC 4.91 3.80 - 5.10 Million/uL   Hemoglobin 14.6 11.7 - 15.5 g/dL   HCT 44.3 35 - 45 %   MCV 90.2 80.0 - 100.0 fL   MCH 29.7 27.0 - 33.0 pg   MCHC 33.0 32.0 - 36.0 g/dL   RDW 15.4 (H) 11.0 - 15.0 %   Platelets 225 140 - 400 Thousand/uL   MPV 10.2 7.5 - 12.5 fL   Neutro Abs 4,998 1,500 - 7,800 cells/uL   Lymphs Abs 1,512 850 - 3,900 cells/uL   Absolute Monocytes 322 200 - 950 cells/uL   Eosinophils Absolute 140 15.0 - 500.0 cells/uL   Basophils Absolute 28 0.0 - 200.0 cells/uL   Neutrophils Relative % 71.4 %   Total Lymphocyte 21.6 %   Monocytes Relative 4.6 %   Eosinophils Relative 2.0 %   Basophils Relative 0.4 %  COMPLETE METABOLIC PANEL WITH GFR  Result Value Ref Range   Glucose, Bld 76 65 - 99 mg/dL   BUN 22 7 - 25 mg/dL   Creat 1.40 (H) 0.50 - 1.05 mg/dL   GFR, Est Non African American 44 (L) > OR = 60 mL/min/1.56m   GFR, Est African American 51 (L) > OR = 60 mL/min/1.750m  BUN/Creatinine Ratio 16 6 - 22 (calc)   Sodium 140 135 - 146 mmol/L   Potassium 4.6 3.5 - 5.3 mmol/L   Chloride 104 98 - 110 mmol/L   CO2 26 20 - 32 mmol/L   Calcium 9.7 8.6 - 10.4 mg/dL   Total Protein 6.9 6.1 - 8.1 g/dL   Albumin 4.4 3.6 - 5.1 g/dL   Globulin 2.5  1.9 - 3.7 g/dL (calc)   AG Ratio 1.8 1.0 - 2.5 (calc)   Total Bilirubin 0.5 0.2 - 1.2 mg/dL   Alkaline phosphatase (APISO) 125 37 - 153 U/L   AST 18 10 - 35 U/L   ALT 25 6 - 29 U/L  Lipid panel  Result Value Ref Range   Cholesterol 140 <200 mg/dL   HDL 62 > OR = 50 mg/dL   Triglycerides 80 <150 mg/dL   LDL Cholesterol (Calc) 62 mg/dL (calc)   Total CHOL/HDL Ratio 2.3 <5.0 (calc)   Non-HDL Cholesterol (Calc) 78 <130 mg/dL (calc)  TSH  Result Value Ref Range   TSH 25.16 (H) mIU/L   Last labs reviewed    Assessment & Plan:   1. Right upper quadrant abdominal pain > left upper quadrant pain Exact source unclear, and has been present for over a month now per her history. No meal relationships, does have a history of vascular disease, both cardiac and renal artery noted.  Denies marked constipation or chronic diarrhea concerns.  Did note some occasional mucus in the stools. Felt  best to send lab tests initially, and also will get an ultrasound, as this is a relatively noninvasive study to help further assess. Numerous entities in the differential presently, and cannot totally exclude an ischemic source noting her vascular history and smoking history. Await initial lab tests ordered an ultrasound results presently. Recommended staying bland with her diet, and Tylenol type products as needed for discomfort presently, as would not recommend anything stronger in the short-term as we discussed.  COMPLETE METABOLIC PANEL WITH GFR - CBC with Differential/Platelet - Lipase - US Abdomen Complete; Future  2. Other specified hypothyroidism We will recheck a TSH today, and was high previously, likely due to noncompliance issues. Did discuss how hypothyroidism can cause utility issues of the gut, although she denies marked constipation issues in the recent past.  - COMPLETE METABOLIC PANEL WITH GFR - TSH  3. Stage 3b chronic kidney disease ([place]) She does have a history of kidney disease, and  has some left flank pain with CVA tenderness noted on exam.  No dysuria noted, Will check a UA as well with the labs, Recommended staying hydrated presently. Also will add an ultrasound of the kidneys - COMPLETE METABOLIC PANEL WITH GFR - US Renal; Future  4. Flank pain As above noted. - Urinalysis, Complete - US Renal; Future   5. Tobacco dependence Current every day smoker.  6.  Anxiety with depression PHQ was reviewed, and she does take medications to help presently. Denied any thoughts of hurting self or hurting others presently.   Await results of initial tests ordered, and tentatively, schedule a follow-up in a couple weeks time, follow-up sooner as needed. If pains become more severe, or she has more concerning symptoms such as nausea or vomiting, fevers, she may need to be seen more emergently in an ER setting. Also recommended she schedule follow-up with her PCP in approximately 4 weeks time, to continue to follow over time with some of her chronic medical conditions including her depression and anxiety issues.       Towanda Malkin, MD 02/18/20 2:19 PM

## 2020-02-18 NOTE — Telephone Encounter (Signed)
Pt. Reports she has had abdominal pain x 1 month. Gradual. Pain is to the right of belly button. Has noticed mucus in stool. No nausea,diarrhea or vomiting. Pain comes and goes. Appointment made for today.  Reason for Disposition . [1] MODERATE pain (e.g., interferes with normal activities) AND [2] pain comes and goes (cramps) AND [3] present > 24 hours  (Exception: pain with Vomiting or Diarrhea - see that Guideline)  Answer Assessment - Initial Assessment Questions 1. LOCATION: "Where does it hurt?"      Above belly button on the right 2. RADIATION: "Does the pain shoot anywhere else?" (e.g., chest, back)     YES 3. ONSET: "When did the pain begin?" (e.g., minutes, hours or days ago)      1 month ago 4. SUDDEN: "Gradual or sudden onset?"     Gradual 5. PATTERN "Does the pain come and go, or is it constant?"    - If constant: "Is it getting better, staying the same, or worsening?"      (Note: Constant means the pain never goes away completely; most serious pain is constant and it progresses)     - If intermittent: "How long does it last?" "Do you have pain now?"     (Note: Intermittent means the pain goes away completely between bouts)     Comes and goes 6. SEVERITY: "How bad is the pain?"  (e.g., Scale 1-10; mild, moderate, or severe)   - MILD (1-3): doesn't interfere with normal activities, abdomen soft and not tender to touch    - MODERATE (4-7): interferes with normal activities or awakens from sleep, tender to touch    - SEVERE (8-10): excruciating pain, doubled over, unable to do any normal activities      Now - 4 7. RECURRENT SYMPTOM: "Have you ever had this type of stomach pain before?" If Yes, ask: "When was the last time?" and "What happened that time?"      No 8. CAUSE: "What do you think is causing the stomach pain?"     No 9. RELIEVING/AGGRAVATING FACTORS: "What makes it better or worse?" (e.g., movement, antacids, bowel movement)     No 10. OTHER SYMPTOMS: "Has there  been any vomiting, diarrhea, constipation, or urine problems?"       Mucus 11. PREGNANCY: "Is there any chance you are pregnant?" "When was your last menstrual period?"       No  Protocols used: ABDOMINAL PAIN - [facility]

## 2020-02-19 LAB — COMPLETE METABOLIC PANEL WITH GFR
AG Ratio: 1.8 (calc) (ref 1.0–2.5)
ALT: 18 U/L (ref 6–29)
AST: 21 U/L (ref 10–35)
Albumin: 4.2 g/dL (ref 3.6–5.1)
Alkaline phosphatase (APISO): 100 U/L (ref 37–153)
BUN/Creatinine Ratio: 17 (calc) (ref 6–22)
BUN: 26 mg/dL — ABNORMAL HIGH (ref 7–25)
CO2: 26 mmol/L (ref 20–32)
Calcium: 9 mg/dL (ref 8.6–10.4)
Chloride: 108 mmol/L (ref 98–110)
Creat: 1.51 mg/dL — ABNORMAL HIGH (ref 0.50–1.05)
GFR, Est African American: 46 mL/min/{1.73_m2} — ABNORMAL LOW (ref >=60)
GFR, Est Non African American: 40 mL/min/{1.73_m2} — ABNORMAL LOW (ref >=60)
Globulin: 2.3 g/dL (calc) (ref 1.9–3.7)
Glucose, Bld: 85 mg/dL (ref 65–99)
Potassium: 4.2 mmol/L (ref 3.5–5.3)
Sodium: 141 mmol/L (ref 135–146)
Total Bilirubin: 0.7 mg/dL (ref 0.2–1.2)
Total Protein: 6.5 g/dL (ref 6.1–8.1)

## 2020-02-19 LAB — URINALYSIS, COMPLETE
Bacteria, UA: NONE SEEN /HPF
Bilirubin Urine: NEGATIVE
Glucose, UA: NEGATIVE
Hgb urine dipstick: NEGATIVE
Hyaline Cast: NONE SEEN /LPF
Ketones, ur: NEGATIVE
Leukocytes,Ua: NEGATIVE
Nitrite: NEGATIVE
Protein, ur: NEGATIVE
RBC / HPF: NONE SEEN /HPF (ref 0–2)
Specific Gravity, Urine: 1.008 (ref 1.001–1.03)
Squamous Epithelial / HPF: NONE SEEN /HPF (ref <=5)
pH: 5.5 (ref 5.0–8.0)

## 2020-02-19 LAB — CBC WITH DIFFERENTIAL/PLATELET
Absolute Monocytes: 580 cells/uL (ref 200–950)
Basophils Absolute: 69 cells/uL (ref 0–200)
Basophils Relative: 1 %
Eosinophils Absolute: 138 cells/uL (ref 15–500)
Eosinophils Relative: 2 %
HCT: 41 % (ref 35.0–45.0)
Hemoglobin: 13.8 g/dL (ref 11.7–15.5)
Lymphs Abs: 1877 cells/uL (ref 850–3900)
MCH: 31.4 pg (ref 27.0–33.0)
MCHC: 33.7 g/dL (ref 32.0–36.0)
MCV: 93.4 fL (ref 80.0–100.0)
MPV: 10.3 fL (ref 7.5–12.5)
Monocytes Relative: 8.4 %
Neutro Abs: 4237 cells/uL (ref 1500–7800)
Neutrophils Relative %: 61.4 %
Platelets: 240 10*3/uL (ref 140–400)
RBC: 4.39 10*6/uL (ref 3.80–5.10)
RDW: 13.6 % (ref 11.0–15.0)
Total Lymphocyte: 27.2 %
WBC: 6.9 10*3/uL (ref 3.8–10.8)

## 2020-02-19 LAB — TSH: TSH: 4.23 mIU/L

## 2020-02-19 LAB — LIPASE: Lipase: 124 U/L — ABNORMAL HIGH (ref 7–60)

## 2020-02-21 ENCOUNTER — Other Ambulatory Visit: Payer: Self-pay | Admitting: Internal Medicine

## 2020-02-21 DIAGNOSIS — R1011 Right upper quadrant pain: Secondary | ICD-10-CM

## 2020-02-21 DIAGNOSIS — R748 Abnormal levels of other serum enzymes: Secondary | ICD-10-CM

## 2020-02-21 DIAGNOSIS — R1012 Left upper quadrant pain: Secondary | ICD-10-CM

## 2020-02-21 NOTE — Progress Notes (Signed)
After the labs were reviewed, the lipase was elevated, higher than previous checks, with concerns for an acute on chronic pancreatitis possible.  Felt best to get gastroenterology input and a referral was placed. The kidney function remains abnormal, slightly worse than previous also noted. Question if a CT scan would be best presently, and await gastroenterology's input presently.

## 2020-02-21 NOTE — Telephone Encounter (Signed)
Incoming fax from [place] is approved from 12/29/2019 to 02/20/2021 With the amount of 60 tablets

## 2020-02-21 NOTE — Progress Notes (Signed)
Patient called.  Patient aware.  

## 2020-02-22 NOTE — Addendum Note (Signed)
Addended by: Lamar Laundry on: 02/22/2020 01:21 PM   Modules accepted: Orders

## 2020-02-22 NOTE — Telephone Encounter (Signed)
Great. Thank you.

## 2020-02-22 NOTE — Telephone Encounter (Addendum)
Contacted the patients pharmacy [facility] and spoke with Louie Casa. Nile Dear that we filed an appeal with the patients insurance and Delene Loll has now been approved. Louie Casa ran it through and got the approval. They will prepare the Rx for the patient to pick up. Nile Dear to d/c the patients Losartan prescription.  Spoke with the patient adv her that we have received an approval for [facility] and that her pharmacy will be contacting her to pick up the prescription. Adv her to stop Losartan and start the Entresto 24-26 mg bid the next day.  Patient sts that she never started losartan and stopped hydralazine back in September. The losartan Rx was sent to her pharmacy. Adv her to double check her medication bottles to ensure that she is not taking losartan (since losartan and entresto should NOT be taken together).   Adv her to start [facility] asap and to have a [facility]met [facility]. Patient verbalized understanding and has no questions or concerns at this time.

## 2020-02-25 ENCOUNTER — Ambulatory Visit: Admission: RE | Admit: 2020-02-25 | Payer: Medicaid Other | Source: Ambulatory Visit

## 2020-02-25 ENCOUNTER — Telehealth: Payer: Self-pay | Admitting: Family Medicine

## 2020-02-25 NOTE — Telephone Encounter (Signed)
FYI, she no showed

## 2020-02-25 NOTE — Telephone Encounter (Signed)
Reached out to patient to see if I could get her scheduled with a member of our Managed medicaid care team but she declined these services.

## 2020-02-25 NOTE — Telephone Encounter (Signed)
[name] at ultrasound dept at [facility] is calling to let Angelica Herrera know that the patient did not show up for her appt today 02/25/20 at 10;30p [phone]

## 2020-02-29 NOTE — Telephone Encounter (Signed)
Lab reminder letter mailed to the patient.

## 2020-03-01 NOTE — Progress Notes (Deleted)
Patient is a 49 year old female patient of Delsa Grana She has history of accelerated hypertension, coronary artery disease status post stent, chronic systolic heart failure, hyperlipidemia, CKD, COPD, and she does see cardiology and vascular specialist and is on Plavix. Last visit with me was 02/18/20 for abdominal pain Follow-up communication from the lab results after that visit was as follows:  The complete metabolic panel showed that the kidney function remains abnormal, slightly worse from last check about 5 months ago. The glucose, liver function tests, electrolytes, and calcium level were normal. The TSH was much improved at 4.23 and normal. The complete blood count was normal with no increased white count noted. The urinalysis was normal. The lipase was elevated at 124, higher than the check a year ago which was 56. The concern is could this be some type of acute on chronic pancreatitis. I do feel consulting gastroenterology is needed, with possibly a CT scan to help further evaluate, and await their input presently. I will place a referral to GI to get their input. She follows up today.   She notes the abdominal pain started about a month ago, Has been constant, intermittently worse Pain described as sharp at times, mostly dull  Location of pain is above and right of the bellybutton and radiates toward the side, occas feels on it on the left side, same area. Notes it hurts when he touches the area. No relationship to meals No N/V,  No fevers,  No diarrhea or constipation  No blood with BM's , no dark stools, question some mucus at times in the stool. He denies any dysuria, no hematuria. No chest pains or shortness of breath. No h/o major abdominal pains Tried tylenol, helps a little No regular NSAID use Alcohol  - occas, 3-4 days a month, not heavy  Activity level - walking some  Tobacco-current every day smoker  Last labs were reviewed, with her TSH significantly  elevated in May (noted with not taking her thyroid supplement 3+ weeks at least before that lab drawn). Noted on thyroid supp now more regularly. Her kidney function was slightly elevated at that time as well.  A follow-up TSH check was ordered, although not yet obtained. She notes she has gained some weight recently.    Wt Readings from Last 3 Encounters:  02/18/20 177 lb (80.3 kg)  12/28/19 165 lb (74.8 kg)  09/02/19 169 lb 14.4 oz (77.1 kg)   1. Right upper quadrant abdominal pain > left upper quadrant pain Exact source unclear, and has been present for over a month now per her history. No meal relationships, does have a history of vascular disease, both cardiac and renal artery noted.  Denies marked constipation or chronic diarrhea concerns.  Did note some occasional mucus in the stools. Felt best to send lab tests initially, and also will get an ultrasound, as this is a relatively noninvasive study to help further assess. Numerous entities in the differential presently, and cannot totally exclude an ischemic source noting her vascular history and smoking history. Await initial lab tests ordered an ultrasound results presently. Recommended staying bland with her diet, and Tylenol type products as needed for discomfort presently, as would not recommend anything stronger in the short-term as we discussed.  COMPLETE METABOLIC PANEL WITH GFR - CBC with Differential/Platelet - Lipase - US Abdomen Complete; Future  2. Other specified hypothyroidism We will recheck a TSH today, and was high previously, likely due to noncompliance issues. Did discuss how hypothyroidism can cause utility  issues of the gut, although she denies marked constipation issues in the recent past.  - COMPLETE METABOLIC PANEL WITH GFR - TSH  3. Stage 3b chronic kidney disease ([place]) She does have a history of kidney disease, and has some left flank pain with CVA tenderness noted on exam.  No dysuria noted, Will  check a UA as well with the labs, Recommended staying hydrated presently. Also will add an ultrasound of the kidneys - COMPLETE METABOLIC PANEL WITH GFR - US Renal; Future  4. Flank pain As above noted. - Urinalysis, Complete - US Renal; Future   5. Tobacco dependence Current every day smoker.  6.  Anxiety with depression PHQ was reviewed, and she does take medications to help presently. Denied any thoughts of hurting self or hurting others presently.   Await results of initial tests ordered, and tentatively, schedule a follow-up in a couple weeks time, follow-up sooner as needed. If pains become more severe, or she has more concerning symptoms such as nausea or vomiting, fevers, she may need to be seen more emergently in an ER setting. Also recommended she schedule follow-up with her PCP in approximately 4 weeks time, to continue to follow over time with some of her chronic medical conditions including her depression and anxiety issues.

## 2020-03-02 ENCOUNTER — Ambulatory Visit: Payer: Medicaid Other | Admitting: Internal Medicine

## 2020-03-03 ENCOUNTER — Ambulatory Visit: Payer: Medicaid Other | Attending: Internal Medicine

## 2020-03-03 ENCOUNTER — Telehealth: Payer: Self-pay

## 2020-03-03 NOTE — Telephone Encounter (Signed)
Copied from [place] [id number]. Topic: General - Other >> Mar 03, 2020 10:06 AM [name] wrote: Reason for CRM: [facility] called to inform Angelica Herrera that patient did not show up for her ultrasound this morning as scheduled. Please advise

## 2020-03-06 ENCOUNTER — Ambulatory Visit: Payer: Self-pay

## 2020-03-15 DIAGNOSIS — Z419 Encounter for procedure for purposes other than remedying health state, unspecified: Secondary | ICD-10-CM | POA: Diagnosis not present

## 2020-03-17 ENCOUNTER — Ambulatory Visit (INDEPENDENT_AMBULATORY_CARE_PROVIDER_SITE_OTHER): Payer: Medicaid Other | Admitting: Family Medicine

## 2020-03-17 ENCOUNTER — Other Ambulatory Visit: Payer: Self-pay

## 2020-03-17 ENCOUNTER — Encounter: Payer: Self-pay | Admitting: Family Medicine

## 2020-03-17 VITALS — BP 122/74 | HR 82 | Temp 98.2°F | Resp 16 | Ht 66.0 in | Wt 187.9 lb

## 2020-03-17 DIAGNOSIS — R109 Unspecified abdominal pain: Secondary | ICD-10-CM | POA: Diagnosis not present

## 2020-03-17 DIAGNOSIS — R748 Abnormal levels of other serum enzymes: Secondary | ICD-10-CM

## 2020-03-17 MED ORDER — PANTOPRAZOLE SODIUM 40 MG PO TBEC: 40.0000 mg | DELAYED_RELEASE_TABLET | Freq: Every day | ORAL | 0 refills | Status: DC

## 2020-03-17 NOTE — Progress Notes (Signed)
Name: Angelica Herrera   MRN: [id number]    DOB: 12-28-1970   Date:03/17/2020       Progress Note  Chief Complaint  Patient presents with  . Follow-up    4 week follow up abdominal pains     Subjective:   Angelica Herrera is a 49 y.o. female, presents to clinic for f/up on abd pain and other chronic conditions.  Pt saw Dr. Lemmie Evens a month ago for abdominal pain onset 1 month prior  Patient continues to have moderate to severe abdominal pain and has been constant and dull but occasionally has waves of sharp and stabbing severe pain.  Still no association with meals or certain foods, no aggravating or alleviating factors that patient can tell me.  She denies any change with positional changes with deep breathing.  She denies any change in her bowel movements no melena, hematochezia, nausea, vomiting.  She does feel pain like a band around her flank to mid back area bilaterally that wraps around her abdomen like a band across her midsection sometimes feels lower sometimes feels higher and is occasionally associated with some indigestion which she uses Tums for.  She denies any bloating, early satiety, dysphagia No fever chills sweats She rarely to occasionally drinks alcohol No frequent NSAID use Lipase was elevated previously and she believes she has 1 or 2 family members with history of pancreatic issues  She denies any urinary symptoms denies hematuria, dysuria, urinary frequency or urgency She denies any vaginal discharge or vaginal symptoms she is status post total hysterectomy with unilateral oophorectomy she is not sure which ovary she has remaining but she does have intermittent pain going to right lower quadrant and sometimes left lower quadrant at different times when the pain is more severe  A few family members with pancreatic issues     Current Outpatient Medications:  .  albuterol (VENTOLIN HFA) 108 (90 Base) MCG/ACT inhaler, Inhale 2 puffs into the lungs every 4 (four)  hours as needed for wheezing or shortness of breath., Disp: 18 g, Rfl: 5 .  aspirin 81 MG tablet, Take 1 tablet (81 mg total) by mouth daily., Disp: 30 tablet, Rfl:  .  atorvastatin (LIPITOR) 80 MG tablet, Take 1 tablet (80 mg total) by mouth at bedtime., Disp: 90 tablet, Rfl: 3 .  carvedilol (COREG) 12.5 MG tablet, Take 1 tablet (12.5 mg total) by mouth 2 (two) times daily with a meal., Disp: 180 tablet, Rfl: 1 .  cetirizine (ZYRTEC) 10 MG tablet, Take 1 tablet (10 mg total) by mouth at bedtime., Disp: 30 tablet, Rfl: 11 .  clopidogrel (PLAVIX) 75 MG tablet, TAKE (1) TABLET BY MOUTH EVERY DAY, Disp: 90 tablet, Rfl: 3 .  Fluticasone-Umeclidin-Vilant (TRELEGY ELLIPTA) 100-62.5-25 MCG/INH AEPB, Inhale 1 puff into the lungs daily. Rinse out mouth and spit after each use, Disp: 28 each, Rfl: 11 .  furosemide (LASIX) 20 MG tablet, TAKE (1) TABLET BY MOUTH EVERY DAY, Disp: 90 tablet, Rfl: 1 .  gabapentin (NEURONTIN) 300 MG capsule, TAKE (1) CAPSULE BY MOUTH TWICE DAILY, Disp: 60 capsule, Rfl: 5 .  levothyroxine (SYNTHROID) 112 MCG tablet, Take 1 tablet (112 mcg total) by mouth daily before breakfast., Disp: 90 tablet, Rfl: 3 .  PARoxetine (PAXIL) 40 MG tablet, TAKE ONE (1) TABLET BY MOUTH ONCE DAILY, Disp: 90 tablet, Rfl: 1 .  sacubitril-valsartan (ENTRESTO) 24-26 MG, Take 1 tablet by mouth 2 (two) times daily., Disp: 60 tablet, Rfl: 5 .  ipratropium-albuterol (DUONEB) 0.5-2.5 (  3) MG/3ML SOLN, Take 3 mLs by nebulization 3 (three) times daily as needed. (Patient not taking: Reported on 03/17/2020), Disp: 360 mL, Rfl: 1 .  nitroGLYCERIN (NITROSTAT) 0.4 MG SL tablet, Place 1 tablet (0.4 mg total) under the tongue every 5 (five) minutes x 3 doses as needed for chest pain. (Patient not taking: Reported on 03/17/2020), Disp: 25 tablet, Rfl: 3 .  Respiratory Therapy Supplies (NEBULIZER/TUBING/MOUTHPIECE) KIT, Disp one nebulizer machine, tubing set and mouthpiece kit (Patient not taking: Reported on 02/18/2020), Disp:  1 kit, Rfl: 0  Current Facility-Administered Medications:  .  mometasone-formoterol (DULERA) 200-5 MCG/ACT inhaler 2 puff, 2 puff, Inhalation, BID, Delsa Grana, PA-C  Patient Active Problem List   Diagnosis Date Noted  . Stage 3b chronic kidney disease ([place]) 02/18/2020  . Anxiety with depression 02/18/2020  . Tobacco dependence 02/18/2020  . Rhinosinusitis 09/02/2019  . Chronic obstructive pulmonary disease ([place]) 09/02/2019  . Squamous cell carcinoma of back 04/20/2019  . Accelerated hypertension 11/13/2018  . Chest pain 11/12/2018  . S/P primary angioplasty with coronary stent 03/04/2018  . Depression, recurrent ([place]) 12/02/2017  . Chronic systolic heart failure ([place]) 11/21/2017  . Controlled substance agreement signed 09/07/2015  . Coronary artery disease involving native coronary artery   . Allergic rhinitis due to pollen 06/28/2015  . GAD (generalized anxiety disorder) 03/17/2015  . Degenerative disc disease, cervical 03/17/2015  . Hyperlipidemia   . Obesity   . CKD (chronic kidney disease), stage IV ([place])   . Ischemic cardiomyopathy   . NSVT (nonsustained ventricular tachycardia) ([place])   . Hypothyroidism   . History of acute anterior wall MI 02/12/2012  . Essential hypertension, malignant 02/12/2012  . Tobacco abuse 02/12/2012    Past Surgical History:  Procedure Laterality Date  . ABDOMINAL HYSTERECTOMY    . CARDIAC CATHETERIZATION  2013   [facility] s/p stent  . CARDIAC CATHETERIZATION Left 08/03/2015   Procedure: Left Heart Cath and Coronary Angiography;  Surgeon: Wellington Hampshire, MD;  Location: [facility];  Service: Cardiovascular;  Laterality: Left;  . DILATION AND CURETTAGE OF UTERUS    . LEFT HEART CATHETERIZATION WITH CORONARY ANGIOGRAM N/A 02/11/2012   Procedure: LEFT HEART CATHETERIZATION WITH CORONARY ANGIOGRAM;  Surgeon: [name], MD;  [facility];  Service: Cardiovascular;  Laterality: N/A;  . RENAL INTERVENTION N/A 11/17/2018    Procedure: RENAL INTERVENTION;  Surgeon: Katha Cabal, MD;  Location: [facility] CV LAB;  Service: Cardiovascular;  Laterality: N/A;    Family History  Problem Relation Age of Onset  . Hypertension Father   . Thyroid disease Father   . AAA (abdominal aortic aneurysm) Father   . Hypertension Mother   . Hyperlipidemia Mother   . Diabetes Mother   . Kidney disease Mother   . Hypertension Sister   . Hypertension Sister   . Supraventricular tachycardia Sister   . Hypertension Brother   . Hypertension Brother   . Hypertension Daughter   . Cancer Paternal Grandfather        lung    Social History   Tobacco Use  . Smoking status: Current Every Day Smoker    Packs/day: 0.25    Years: 30.00    Pack years: 7.50    Types: Cigarettes  . Smokeless tobacco: Never Used  Vaping Use  . Vaping Use: Never used  Substance Use Topics  . Alcohol use: Not Currently    Comment: Says she previously drank socially - never a heavy drinker.  No etoh  since 08/2018.  Marland Kitchen Drug use: No    Types: Marijuana     No Known Allergies  Health Maintenance  Topic Date Due  . COVID-19 Vaccine (1) Never done  . MAMMOGRAM  Never done  . TETANUS/TDAP  03/24/2020 (Originally 08/15/1988)  . INFLUENZA VACCINE  07/13/2020 (Originally 11/14/2019)  . COLONOSCOPY  02/17/2021 (Originally 08/16/2019)  . Hepatitis C Screening  02/17/2021 (Originally May 12, 1970)  . HIV Screening  Completed  . PAP SMEAR-Modifier  Discontinued    Chart Review Today: I personally reviewed active problem list, medication list, allergies, family history, social history, health maintenance, notes from last encounter, lab results, imaging with the patient/caregiver today.   Review of Systems  10 Systems reviewed and are negative for acute change except as noted in the HPI.  Objective:   Vitals:   03/17/20 1010  BP: 122/74  Pulse: 82  Resp: 16  Temp: 98.2 F (36.8 C)  TempSrc: Oral  SpO2: 95%  Weight: 187 lb 14.4 oz (85.2 kg)    Height: 5' 6"  (1.676 m)    Body mass index is 30.33 kg/m.  Physical Exam Vitals and nursing note reviewed.  Constitutional:      General: She is not in acute distress.    Appearance: Normal appearance. She is well-developed. She is obese. She is not ill-appearing, toxic-appearing or diaphoretic.     Interventions: Face mask in place.  HENT:     Head: Normocephalic and atraumatic.     Right Ear: External ear normal.     Left Ear: External ear normal.  Eyes:     General: Lids are normal. No scleral icterus.       Right eye: No discharge.        Left eye: No discharge.     Conjunctiva/sclera: Conjunctivae normal.  Neck:     Trachea: Phonation normal. No tracheal deviation.  Cardiovascular:     Rate and Rhythm: Normal rate and regular rhythm.     Pulses: Normal pulses.          Radial pulses are 2+ on the right side and 2+ on the left side.       Posterior tibial pulses are 2+ on the right side and 2+ on the left side.     Heart sounds: Normal heart sounds. No murmur heard.  No friction rub. No gallop.   Pulmonary:     Effort: Pulmonary effort is normal. No respiratory distress.     Breath sounds: Normal breath sounds. No stridor. No wheezing, rhonchi or rales.  Chest:     Chest wall: No tenderness.  Abdominal:     General: Abdomen is protuberant. Bowel sounds are normal. There is no distension or abdominal bruit.     Palpations: Abdomen is soft. There is no hepatomegaly, splenomegaly, mass or pulsatile mass.     Tenderness: There is generalized abdominal tenderness and tenderness in the epigastric area and left upper quadrant. There is guarding (voluntary). There is no right CVA tenderness, left CVA tenderness or rebound.  Musculoskeletal:     Right lower leg: No edema.     Left lower leg: No edema.  Skin:    General: Skin is warm and dry.     Coloration: Skin is not jaundiced or pale.     Findings: No rash.  Neurological:     Mental Status: She is alert.     Motor: No  abnormal muscle tone.     Gait: Gait normal.  Psychiatric:  Mood and Affect: Mood normal.        Speech: Speech normal.        Behavior: Behavior normal.         Assessment & Plan:   1. Abdominal pain, unspecified abdominal location Pain is been ongoing for 2 months constant with intermittent sharp and stabbing pain to various locations today she is very tender on exam, no urinary symptoms, vaginal symptoms Previous office visit and labs reviewed she has elevated lipase without any history of pancreatitis She has history of hysterectomy and unilateral oophorectomy she is unsure which ovary she has remaining but I have low suspicion for any GU etiology since pain is on both lower quadrants Urinalysis to screen for UTI although last urinary labs were unremarkable   - Urinalysis, Routine w reflex microscopic - Lipase - COMPLETE METABOLIC PANEL WITH GFR - CBC with Differential/Platelet - Urine Culture - CT Abdomen Pelvis Wo Contrast  2. Elevated lipase CT scan to rule out pancreatitis - Lipase - COMPLETE METABOLIC PANEL WITH GFR   We have tried to get the patient a stat CT scan while she was here today with tenderness on exam and more than 2 months of symptoms, however the stat CT scan was not approved  They will order a routine CT abdomen pelvis without contrast due to her chronic kidney disease  She was instructed to start Protonix 40 mg every evening on empty stomach and to if not eating for 2 to 3 hours prior to bedtime  She does have a GI follow-up scheduled  ER precautions reviewed with pt who verbalized understanding  Delsa Grana, PA-C 03/17/20 10:17 AM

## 2020-03-18 LAB — URINE CULTURE
MICRO NUMBER:: 11275333
Result:: NO GROWTH
SPECIMEN QUALITY:: ADEQUATE

## 2020-03-18 LAB — URINALYSIS, ROUTINE W REFLEX MICROSCOPIC
Bacteria, UA: NONE SEEN /HPF
Bilirubin Urine: NEGATIVE
Glucose, UA: NEGATIVE
Hgb urine dipstick: NEGATIVE
Hyaline Cast: NONE SEEN /LPF
Ketones, ur: NEGATIVE
Nitrite: NEGATIVE
RBC / HPF: NONE SEEN /HPF (ref 0–2)
Specific Gravity, Urine: 1.02 (ref 1.001–1.03)
pH: 6 (ref 5.0–8.0)

## 2020-03-18 LAB — CBC WITH DIFFERENTIAL/PLATELET
Absolute Monocytes: 421 cells/uL (ref 200–950)
Basophils Absolute: 49 cells/uL (ref 0–200)
Basophils Relative: 0.8 %
Eosinophils Absolute: 140 cells/uL (ref 15–500)
Eosinophils Relative: 2.3 %
HCT: 42 % (ref 35.0–45.0)
Hemoglobin: 14.4 g/dL (ref 11.7–15.5)
Lymphs Abs: 1574 cells/uL (ref 850–3900)
MCH: 32.2 pg (ref 27.0–33.0)
MCHC: 34.3 g/dL (ref 32.0–36.0)
MCV: 94 fL (ref 80.0–100.0)
MPV: 10.5 fL (ref 7.5–12.5)
Monocytes Relative: 6.9 %
Neutro Abs: 3916 cells/uL (ref 1500–7800)
Neutrophils Relative %: 64.2 %
Platelets: 218 10*3/uL (ref 140–400)
RBC: 4.47 10*6/uL (ref 3.80–5.10)
RDW: 12.9 % (ref 11.0–15.0)
Total Lymphocyte: 25.8 %
WBC: 6.1 10*3/uL (ref 3.8–10.8)

## 2020-03-18 LAB — COMPLETE METABOLIC PANEL WITH GFR
AG Ratio: 1.9 (calc) (ref 1.0–2.5)
ALT: 33 U/L — ABNORMAL HIGH (ref 6–29)
AST: 26 U/L (ref 10–35)
Albumin: 4.3 g/dL (ref 3.6–5.1)
Alkaline phosphatase (APISO): 101 U/L (ref 37–153)
BUN/Creatinine Ratio: 15 (calc) (ref 6–22)
BUN: 25 mg/dL (ref 7–25)
CO2: 20 mmol/L (ref 20–32)
Calcium: 9.3 mg/dL (ref 8.6–10.4)
Chloride: 114 mmol/L — ABNORMAL HIGH (ref 98–110)
Creat: 1.72 mg/dL — ABNORMAL HIGH (ref 0.50–1.05)
GFR, Est African American: 39 mL/min/{1.73_m2} — ABNORMAL LOW (ref >=60)
GFR, Est Non African American: 34 mL/min/{1.73_m2} — ABNORMAL LOW (ref >=60)
Globulin: 2.3 g/dL (calc) (ref 1.9–3.7)
Glucose, Bld: 96 mg/dL (ref 65–99)
Potassium: 3.9 mmol/L (ref 3.5–5.3)
Sodium: 145 mmol/L (ref 135–146)
Total Bilirubin: 0.5 mg/dL (ref 0.2–1.2)
Total Protein: 6.6 g/dL (ref 6.1–8.1)

## 2020-03-18 LAB — LIPASE: Lipase: 48 U/L (ref 7–60)

## 2020-03-21 ENCOUNTER — Ambulatory Visit: Payer: Medicaid Other | Admitting: Gastroenterology

## 2020-03-27 ENCOUNTER — Other Ambulatory Visit: Payer: Self-pay | Admitting: Emergency Medicine

## 2020-03-27 DIAGNOSIS — R748 Abnormal levels of other serum enzymes: Secondary | ICD-10-CM

## 2020-03-27 DIAGNOSIS — R109 Unspecified abdominal pain: Secondary | ICD-10-CM

## 2020-04-15 DIAGNOSIS — Z419 Encounter for procedure for purposes other than remedying health state, unspecified: Secondary | ICD-10-CM | POA: Diagnosis not present

## 2020-05-01 ENCOUNTER — Ambulatory Visit: Payer: Medicaid Other | Admitting: Physician Assistant

## 2020-05-15 ENCOUNTER — Ambulatory Visit: Payer: Medicaid Other | Admitting: Gastroenterology

## 2020-05-15 ENCOUNTER — Encounter: Payer: Self-pay | Admitting: *Deleted

## 2020-05-16 DIAGNOSIS — Z419 Encounter for procedure for purposes other than remedying health state, unspecified: Secondary | ICD-10-CM | POA: Diagnosis not present

## 2020-05-25 ENCOUNTER — Telehealth: Payer: Self-pay | Admitting: Family Medicine

## 2020-05-25 DIAGNOSIS — F3341 Major depressive disorder, recurrent, in partial remission: Secondary | ICD-10-CM

## 2020-05-25 DIAGNOSIS — F411 Generalized anxiety disorder: Secondary | ICD-10-CM

## 2020-05-25 DIAGNOSIS — G8929 Other chronic pain: Secondary | ICD-10-CM

## 2020-05-25 DIAGNOSIS — M542 Cervicalgia: Secondary | ICD-10-CM

## 2020-05-26 NOTE — Telephone Encounter (Signed)
lvm for scheduling °

## 2020-06-13 DIAGNOSIS — Z419 Encounter for procedure for purposes other than remedying health state, unspecified: Secondary | ICD-10-CM | POA: Diagnosis not present

## 2020-06-30 ENCOUNTER — Other Ambulatory Visit: Payer: Self-pay | Admitting: Family Medicine

## 2020-06-30 DIAGNOSIS — Z9889 Other specified postprocedural states: Secondary | ICD-10-CM

## 2020-06-30 DIAGNOSIS — I739 Peripheral vascular disease, unspecified: Secondary | ICD-10-CM

## 2020-06-30 DIAGNOSIS — I701 Atherosclerosis of renal artery: Secondary | ICD-10-CM

## 2020-07-05 NOTE — Telephone Encounter (Signed)
Pt needs 3 month followup per Dr Lucio Edward

## 2020-07-12 ENCOUNTER — Telehealth: Payer: Self-pay

## 2020-07-12 ENCOUNTER — Other Ambulatory Visit: Payer: Self-pay | Admitting: *Deleted

## 2020-07-12 DIAGNOSIS — I701 Atherosclerosis of renal artery: Secondary | ICD-10-CM

## 2020-07-12 DIAGNOSIS — I10 Essential (primary) hypertension: Secondary | ICD-10-CM

## 2020-07-12 NOTE — Telephone Encounter (Signed)
-----   Message from Valora Corporal, RN sent at 07/12/2020  7:21 AM EDT ----- Patient needs to reschedule appointment for both provider and renal artery ultrasound.   Thanks ----- Message ----- From: Kavin Leech, RN Sent: 07/11/2020   1:35 PM EDT To: Valora Corporal, RN   ----- Message ----- From: Britt Bottom, CMA Sent: 07/11/2020  11:04 AM EDT To: Rebeca Alert [facility] Triage  Pt has upcoming appointment overdue 4 month f/u pt never had renal artery ultrasound testing completed. Please advise if ok to keep future appointment.

## 2020-07-12 NOTE — Telephone Encounter (Signed)
Unable to leave vm to schedule renal ultrasound

## 2020-07-14 DIAGNOSIS — Z419 Encounter for procedure for purposes other than remedying health state, unspecified: Secondary | ICD-10-CM | POA: Diagnosis not present

## 2020-07-17 ENCOUNTER — Other Ambulatory Visit: Payer: Self-pay

## 2020-07-17 ENCOUNTER — Emergency Department
Admission: EM | Admit: 2020-07-17 | Discharge: 2020-07-17 | Disposition: A | Discharge status: Home / Self Care | Payer: Medicaid Other | Attending: Emergency Medicine | Admitting: Emergency Medicine

## 2020-07-17 ENCOUNTER — Emergency Department: Payer: Medicaid Other

## 2020-07-17 DIAGNOSIS — Z79899 Other long term (current) drug therapy: Secondary | ICD-10-CM | POA: Diagnosis not present

## 2020-07-17 DIAGNOSIS — J449 Chronic obstructive pulmonary disease, unspecified: Secondary | ICD-10-CM | POA: Insufficient documentation

## 2020-07-17 DIAGNOSIS — K047 Periapical abscess without sinus: Secondary | ICD-10-CM

## 2020-07-17 DIAGNOSIS — I130 Hypertensive heart and chronic kidney disease with heart failure and stage 1 through stage 4 chronic kidney disease, or unspecified chronic kidney disease: Secondary | ICD-10-CM | POA: Diagnosis not present

## 2020-07-17 DIAGNOSIS — Z7982 Long term (current) use of aspirin: Secondary | ICD-10-CM | POA: Insufficient documentation

## 2020-07-17 DIAGNOSIS — R519 Headache, unspecified: Secondary | ICD-10-CM | POA: Diagnosis not present

## 2020-07-17 DIAGNOSIS — N184 Chronic kidney disease, stage 4 (severe): Secondary | ICD-10-CM | POA: Insufficient documentation

## 2020-07-17 DIAGNOSIS — E039 Hypothyroidism, unspecified: Secondary | ICD-10-CM | POA: Insufficient documentation

## 2020-07-17 DIAGNOSIS — K056 Periodontal disease, unspecified: Secondary | ICD-10-CM | POA: Diagnosis not present

## 2020-07-17 DIAGNOSIS — I5042 Chronic combined systolic (congestive) and diastolic (congestive) heart failure: Secondary | ICD-10-CM | POA: Diagnosis not present

## 2020-07-17 DIAGNOSIS — K029 Dental caries, unspecified: Secondary | ICD-10-CM | POA: Diagnosis not present

## 2020-07-17 DIAGNOSIS — I2510 Atherosclerotic heart disease of native coronary artery without angina pectoris: Secondary | ICD-10-CM | POA: Insufficient documentation

## 2020-07-17 DIAGNOSIS — F17210 Nicotine dependence, cigarettes, uncomplicated: Secondary | ICD-10-CM | POA: Diagnosis not present

## 2020-07-17 DIAGNOSIS — K0889 Other specified disorders of teeth and supporting structures: Secondary | ICD-10-CM | POA: Diagnosis present

## 2020-07-17 DIAGNOSIS — Z8739 Personal history of other diseases of the musculoskeletal system and connective tissue: Secondary | ICD-10-CM | POA: Diagnosis not present

## 2020-07-17 LAB — CBC WITH DIFFERENTIAL/PLATELET
Abs Immature Granulocytes: 0.04 10*3/uL (ref 0.00–0.07)
Basophils Absolute: 0.1 10*3/uL (ref 0.0–0.1)
Basophils Relative: 0 %
Eosinophils Absolute: 0.3 10*3/uL (ref 0.0–0.5)
Eosinophils Relative: 3 %
HCT: 45.3 % (ref 36.0–46.0)
Hemoglobin: 15.1 g/dL — ABNORMAL HIGH (ref 12.0–15.0)
Immature Granulocytes: 0 %
Lymphocytes Relative: 10 %
Lymphs Abs: 1.2 10*3/uL (ref 0.7–4.0)
MCH: 30.9 pg (ref 26.0–34.0)
MCHC: 33.3 g/dL (ref 30.0–36.0)
MCV: 92.6 fL (ref 80.0–100.0)
Monocytes Absolute: 1 10*3/uL (ref 0.1–1.0)
Monocytes Relative: 8 %
Neutro Abs: 9.4 10*3/uL — ABNORMAL HIGH (ref 1.7–7.7)
Neutrophils Relative %: 79 %
Platelets: 213 10*3/uL (ref 150–400)
RBC: 4.89 MIL/uL (ref 3.87–5.11)
RDW: 13.9 % (ref 11.5–15.5)
WBC: 12 10*3/uL — ABNORMAL HIGH (ref 4.0–10.5)
nRBC: 0 % (ref 0.0–0.2)

## 2020-07-17 LAB — COMPREHENSIVE METABOLIC PANEL
ALT: 18 U/L (ref 0–44)
AST: 17 U/L (ref 15–41)
Albumin: 3.9 g/dL (ref 3.5–5.0)
Alkaline Phosphatase: 82 U/L (ref 38–126)
Anion gap: 13 (ref 5–15)
BUN: 41 mg/dL — ABNORMAL HIGH (ref 6–20)
CO2: 20 mmol/L — ABNORMAL LOW (ref 22–32)
Calcium: 9.1 mg/dL (ref 8.9–10.3)
Chloride: 105 mmol/L (ref 98–111)
Creatinine, Ser: 1.65 mg/dL — ABNORMAL HIGH (ref 0.44–1.00)
GFR, Estimated: 38 mL/min — ABNORMAL LOW (ref >60)
Glucose, Bld: 97 mg/dL (ref 70–99)
Potassium: 3.6 mmol/L (ref 3.5–5.1)
Sodium: 138 mmol/L (ref 135–145)
Total Bilirubin: 1.1 mg/dL (ref 0.3–1.2)
Total Protein: 7.7 g/dL (ref 6.5–8.1)

## 2020-07-17 MED ORDER — LIDOCAINE VISCOUS HCL 2 % MT SOLN: 15.0000 mL | OROMUCOSAL | 0 refills | Status: DC | PRN

## 2020-07-17 MED ORDER — AMOXICILLIN-POT CLAVULANATE 875-125 MG PO TABS
1.0000 | ORAL_TABLET | Freq: Once | ORAL | Status: AC
  Administered 2020-07-17: 1 via ORAL
  Filled 2020-07-17: qty 1

## 2020-07-17 MED ORDER — IOHEXOL 300 MG/ML  SOLN
60.0000 mL | Freq: Once | INTRAMUSCULAR | Status: AC | PRN
  Administered 2020-07-17: 60 mL via INTRAVENOUS
  Filled 2020-07-17: qty 60

## 2020-07-17 MED ORDER — SODIUM CHLORIDE 0.9 % IV BOLUS
500.0000 mL | Freq: Once | INTRAVENOUS | Status: AC
  Administered 2020-07-17: 500 mL via INTRAVENOUS

## 2020-07-17 MED ORDER — MORPHINE SULFATE (PF) 4 MG/ML IV SOLN
4.0000 mg | Freq: Once | INTRAVENOUS | Status: AC
  Administered 2020-07-17: 4 mg via INTRAVENOUS
  Filled 2020-07-17: qty 1

## 2020-07-17 MED ORDER — LIDOCAINE VISCOUS HCL 2 % MT SOLN
15.0000 mL | Freq: Once | OROMUCOSAL | Status: AC
  Administered 2020-07-17: 15 mL via OROMUCOSAL
  Filled 2020-07-17: qty 15

## 2020-07-17 MED ORDER — AMOXICILLIN-POT CLAVULANATE 875-125 MG PO TABS: 1.0000 | ORAL_TABLET | Freq: Two times a day (BID) | ORAL | 0 refills | Status: AC

## 2020-07-17 MED ORDER — ACETAMINOPHEN 325 MG PO TABS
325.0000 mg | ORAL_TABLET | Freq: Once | ORAL | Status: AC
  Administered 2020-07-17: 325 mg via ORAL
  Filled 2020-07-17: qty 1

## 2020-07-17 MED ORDER — HYDROCODONE-ACETAMINOPHEN 5-325 MG PO TABS
1.0000 | ORAL_TABLET | Freq: Once | ORAL | Status: AC
Start: 2020-07-17 — End: 2020-07-17
  Administered 2020-07-17: 1 via ORAL
  Filled 2020-07-17: qty 1

## 2020-07-17 NOTE — Discharge Instructions (Addendum)
Please take your antibiotic as prescribed. Use viscous lidocaine for oral pain. Please follow up with a dentist ASAP.  OPTIONS FOR DENTAL FOLLOW UP CARE  [place] and [place] [email or web address] [phone])  [facility] [phone])  [place] [phone])  [place] [phone])  [facility] [phone]) This clinic caters to the indigent population and is on a lottery system. Location: [employer] [facility], [place], [place], [place] Clinic Hours: Wednesdays from 6pm - 9pm, patients seen by a lottery system. For dates, call or go to [email or web address] Services: Cleanings, fillings and simple extractions. Payment Options: DENTAL WORK IS FREE OF CHARGE. Bring proof of income or support. Best way to get seen: Arrive at 5:15 pm - this is a lottery, NOT first come/first serve, so arriving earlier will not increase your chances of being seen.     [facility] [phone] Select option 1 for emergencies   Location: [facility], [place], [address] [place] Clinic Hours: No walk-ins accepted - call the day before to schedule an appointment. Check in times are 9:30 am and 1:30 pm. Services: Simple extractions, temporary fillings, pulpectomy/pulp debridement, uncomplicated abscess drainage. Payment Options: PAYMENT IS DUE AT THE TIME OF SERVICE.  Fee is usually $100-200, additional surgical procedures (e.g. abscess drainage) may be extra. Cash, checks, Visa/MasterCard accepted.  Can file Medicaid if patient is covered for dental - patient should call case worker to check. No discount for [facility] patients. Best way to get seen: MUST call the day before and get onto the schedule.  Can usually be seen the next 1-2 days. No walk-ins accepted.     [facility] [phone]   Location: [place], [place] Clinic Hours: M, W, Th, F 8am or 1:30pm, Tues 9a or 1:30 - first come/first served. Services: Simple extractions, temporary fillings, uncomplicated abscess drainage.  You do not need to be an [facility] resident. Payment Options: PAYMENT IS DUE AT THE TIME OF SERVICE. Dental insurance, otherwise sliding scale - bring proof of income or support. Depending on income and treatment needed, cost is usually $50-200. Best way to get seen: Arrive early as it is first come/first served.     [facility] [phone]   Location: [place] [facility] Hours: Mon-Thu 8a-5p Services: Most basic dental services including extractions and fillings. Payment Options: PAYMENT IS DUE AT THE TIME OF SERVICE. Sliding scale, up to 50% off - bring proof if income or support. Medicaid with dental option accepted. Best way to get seen: Call to schedule an appointment, can usually be seen within 2 weeks OR they will try to see walk-ins - show up at [place] or 2p (you may have to wait).     [facility] [place] RESIDENTS ONLY   Location: [facility], [place] [address], [place],  [address] Clinic Hours: By appointment only. Monday - Thursday 8am-5pm, Friday 8am-12pm Services: Cleanings, fillings, extractions. Payment Options: PAYMENT IS DUE AT THE TIME OF SERVICE. Cash, Visa or MasterCard. Sliding scale - $30 minimum per service. Best way to get seen: Come in to office, complete packet and make an appointment - need proof of income or support monies for each household member and proof of [facility] residence. Usually takes about a  month to get in.     [facility] [phone]    Location: [address] [place] [facility] Hours: Walk-in Urgent Care Dental Services are offered Monday-Friday mornings only. The numbers of emergencies accepted daily is limited to the number of providers available. Maximum 15 - Mondays, Wednesdays & Thursdays Maximum 10 - Tuesdays & Fridays Services: You do not need to be a [facility] resident to be seen for a dental emergency. Emergencies are defined as pain, swelling, abnormal bleeding, or dental trauma. Walkins will receive x-rays if needed. NOTE: Dental cleaning is not an emergency. Payment Options: PAYMENT IS DUE AT THE TIME OF SERVICE. Minimum co-pay is $40.00 for uninsured patients. Minimum co-pay is $3.00 for Medicaid with dental coverage. Dental Insurance is accepted and must be presented at time of visit. Medicare does not cover dental. Forms of payment: Cash, credit card, checks. Best way to get seen: If not previously registered with the clinic, walk-in dental registration begins at 7:15 am and is on a first come/first serve basis. If previously registered with the clinic, call to make an appointment.     [facility] [place] ONLY   Location: [address] [place], [place] Clinic Hours: Mon-Thu 10a-2p Services: Extractions only! Payment Options: FREE (donations accepted) - bring proof of income or support Best way to get seen: Call and schedule an appointment OR come at 8am on the 1st Monday of every month (except for holidays) when it is first come/first served.     [facility] [phone]   Location: [place], [place] Clinic Hours: Friday mornings Services, Payment Options, Best way to get seen: Call for info

## 2020-07-17 NOTE — ED Provider Notes (Signed)
[facility] Emergency Department Provider Note  ____________________________________________   Event Date/Time   First MD Initiated Contact with Patient 07/17/20 1509     (approximate)  I have reviewed the triage vital signs and the nursing notes.   HISTORY  Chief Complaint Dental Pain   HPI Angelica Herrera is a 50 y.o. female who presents to the emergency department for evaluation of left-sided lower dental pain and facial swelling this been present over the last week or so.  Patient states that pain began approximately 1 week ago just under the left mandible.  She reports that she knew she had a broken tooth on the left lower side, but has never had any difficulty or problems out of it.  She reports that the patient swelling began becoming more significant over the last 2 to 3 days.  She has been treating at home with ibuprofen, but states that she knows she isto take this due to her renal function.  She has not tried any other alleviating measures and has not had any recent dental visits.  She denies any fevers, chills or other systemic symptoms.  She denies any pain under the tongue.         Past Medical History:  Diagnosis Date  . Arthritis    gout  . CAD (coronary artery disease)    a. 02/2012 s/p anterolateral STEMI/Cath/PCI: LM 100 (3.0x15 DES ), LAD sm, severely diseased (PTCA w/ 1.90m balloon), LCX nl, RI moderate size, nl, RCA 30-458mAM/PDA/PLA nl, R->L collats noted; b. cath 08/03/15: widely patent ostLM stent at 10%, mLAD 60%,  mRCA 30%, EF 35-40%, ant wall HK, mildly elevated LVEDP; c. 02/2017 MV: ant infarct, mild peri-infarct isch (similar to 2017 study)->Med Rx.  . Chronic combined systolic (congestive) and diastolic (congestive) heart failure (HCArroyo Gardens   a. 02/2017 Echo: EF 30-35%, diff HK, sev HK/poss AK of ant and lat walls. Gr1 DD. Mild to mod MR. Nl RV fxn. Mod TR.  . CKD (chronic kidney disease), stage IV (HCB[place]  . HTN (hypertension)    . Hyperlipidemia   . Hypothyroidism   . Ischemic cardiomyopathy    a. 02/2012 Echo: EF 40-45%, ant/lat HK, Gr1 DD, Mild MR, PASP 4330m; b. 02/2017 Echo: EF 30-35%.  . NSVT (nonsustained ventricular tachycardia) (HCC[place]  a. 02/2012 post-MI  . Obesity   . Pancreatitis   . S/P primary angioplasty with coronary stent 03/04/2018   Dual antiplatelet therapy INDEFINITELY per cardiology note Nov 2019  . Tobacco abuse     Patient Active Problem List   Diagnosis Date Noted  . Stage 3b chronic kidney disease (HCCPond Creek1/08/2019  . Anxiety with depression 02/18/2020  . Tobacco dependence 02/18/2020  . Rhinosinusitis 09/02/2019  . Chronic obstructive pulmonary disease (HCCTekam[place]5/20/2021  . Squamous cell carcinoma of back 04/20/2019  . Accelerated hypertension 11/13/2018  . Chest pain 11/12/2018  . S/P primary angioplasty with coronary stent 03/04/2018  . Depression, recurrent (HCCSmarts[place]8/20/2019  . Chronic systolic heart failure (HCCCal[place]8/12/2017  . Controlled substance agreement signed 09/07/2015  . Coronary artery disease involving native coronary artery   . Allergic rhinitis due to pollen 06/28/2015  . GAD (generalized anxiety disorder) 03/17/2015  . Degenerative disc disease, cervical 03/17/2015  . Hyperlipidemia   . Obesity   . CKD (chronic kidney disease), stage IV (HCCBoulevard Gardens . Ischemic cardiomyopathy   . NSVT (nonsustained ventricular tachycardia) (HCCSeba Dalk[place] . Hypothyroidism   . History of acute  anterior wall MI 02/12/2012  . Essential hypertension, malignant 02/12/2012  . Tobacco abuse 02/12/2012    Past Surgical History:  Procedure Laterality Date  . ABDOMINAL HYSTERECTOMY    . CARDIAC CATHETERIZATION  2013   [facility] s/p stent  . CARDIAC CATHETERIZATION Left 08/03/2015   Procedure: Left Heart Cath and Coronary Angiography;  Surgeon: Wellington Hampshire, MD;  Location: [facility];  Service: Cardiovascular;  Laterality: Left;  . DILATION AND CURETTAGE OF UTERUS    . LEFT HEART  CATHETERIZATION WITH CORONARY ANGIOGRAM N/A 02/11/2012   Procedure: LEFT HEART CATHETERIZATION WITH CORONARY ANGIOGRAM;  Surgeon: [name], MD;  Location: [facility] CATH LAB;  Service: Cardiovascular;  Laterality: N/A;  . RENAL INTERVENTION N/A 11/17/2018   Procedure: RENAL INTERVENTION;  Surgeon: Katha Cabal, MD;  Location: [facility];  Service: Cardiovascular;  Laterality: N/A;    Prior to Admission medications   Medication Sig Start Date End Date Taking? Authorizing Provider  amoxicillin-clavulanate (AUGMENTIN) 875-125 MG tablet Take 1 tablet by mouth 2 (two) times daily for 10 days. 07/17/20 07/27/20 Yes Jeri Rawlins, Farrel Gordon, PA  lidocaine (XYLOCAINE) 2 % solution Use as directed 15 mLs in the mouth or throat as needed for mouth pain. 07/17/20  Yes Marlana Salvage, PA  albuterol (VENTOLIN HFA) 108 (90 Base) MCG/ACT inhaler Inhale 2 puffs into the lungs every 4 (four) hours as needed for wheezing or shortness of breath. 09/02/19   Delsa Grana, PA-C  aspirin 81 MG tablet Take 1 tablet (81 mg total) by mouth daily. 02/15/12   Theora Gianotti, NP  atorvastatin (LIPITOR) 80 MG tablet Take 1 tablet (80 mg total) by mouth at bedtime. 02/16/20   Wellington Hampshire, MD  carvedilol (COREG) 12.5 MG tablet Take 1 tablet (12.5 mg total) by mouth 2 (two) times daily with a meal. 02/16/20   Wellington Hampshire, MD  cetirizine (ZYRTEC) 10 MG tablet Take 1 tablet (10 mg total) by mouth at bedtime. 09/02/19   Delsa Grana, PA-C  clopidogrel (PLAVIX) 75 MG tablet TAKE (1) TABLET BY MOUTH EVERY DAY 07/03/20   Delsa Grana, PA-C  Fluticasone-Umeclidin-Vilant (TRELEGY ELLIPTA) 100-62.5-25 MCG/INH AEPB Inhale 1 puff into the lungs daily. Rinse out mouth and spit after each use 09/02/19   Delsa Grana, PA-C  furosemide (LASIX) 20 MG tablet TAKE (1) TABLET BY MOUTH EVERY DAY 02/16/20   Wellington Hampshire, MD  gabapentin (NEURONTIN) 300 MG capsule TAKE (1) CAPSULE BY MOUTH TWICE DAILY 05/25/20   Delsa Grana,  PA-C  ipratropium-albuterol (DUONEB) 0.5-2.5 (3) MG/3ML SOLN Take 3 mLs by nebulization 3 (three) times daily as needed. Patient not taking: Reported on 03/17/2020 09/02/19   Delsa Grana, PA-C  levothyroxine (SYNTHROID) 112 MCG tablet Take 1 tablet (112 mcg total) by mouth daily before breakfast. 09/03/19   Delsa Grana, PA-C  nitroGLYCERIN (NITROSTAT) 0.4 MG SL tablet Place 1 tablet (0.4 mg total) under the tongue every 5 (five) minutes x 3 doses as needed for chest pain. Patient not taking: Reported on 03/17/2020 08/25/19   Loel Dubonnet, NP  pantoprazole (PROTONIX) 40 MG tablet Take 1 tablet (40 mg total) by mouth at bedtime. Do not eat 2 hours before taking 03/17/20   Delsa Grana, PA-C  PARoxetine (PAXIL) 40 MG tablet TAKE ONE (1) TABLET BY MOUTH ONCE DAILY 05/25/20   Delsa Grana, PA-C  Respiratory Therapy Supplies (NEBULIZER/TUBING/MOUTHPIECE) KIT Disp one nebulizer machine, tubing set and mouthpiece kit Patient not taking: Reported on 02/18/2020 09/02/19  Delsa Grana, PA-C  sacubitril-valsartan (ENTRESTO) 24-26 MG Take 1 tablet by mouth 2 (two) times daily. 12/28/19   Wellington Hampshire, MD    Allergies Patient has no known allergies.  Family History  Problem Relation Age of Onset  . Hypertension Father   . Thyroid disease Father   . AAA (abdominal aortic aneurysm) Father   . Hypertension Mother   . Hyperlipidemia Mother   . Diabetes Mother   . Kidney disease Mother   . Hypertension Sister   . Hypertension Sister   . Supraventricular tachycardia Sister   . Hypertension Brother   . Hypertension Brother   . Hypertension Daughter   . Cancer Paternal Grandfather        lung    Social History Social History   Tobacco Use  . Smoking status: Current Every Day Smoker    Packs/day: 0.25    Years: 30.00    Pack years: 7.50    Types: Cigarettes  . Smokeless tobacco: Never Used  Vaping Use  . Vaping Use: Never used  Substance Use Topics  . Alcohol use: Not Currently     Comment: Says she previously drank socially - never a heavy drinker.  No etoh since 08/2018.  Marland Kitchen Drug use: No    Types: Marijuana    Review of Systems Constitutional: No fever/chills Eyes: No visual changes. ENT: + Left-sided dental pain and facial swelling, no sore throat. Cardiovascular: Denies chest pain. Respiratory: Denies shortness of breath. Gastrointestinal: No abdominal pain.  No nausea, no vomiting.  No diarrhea.  No constipation. Genitourinary: Negative for dysuria. Musculoskeletal: Negative for back pain. Skin: Negative for rash. Neurological: Negative for headaches, focal weakness or numbness.  ____________________________________________   PHYSICAL EXAM:  VITAL SIGNS: ED Triage Vitals  Enc Vitals Group     BP 07/17/20 1454 (!) 142/88     Pulse Rate 07/17/20 1454 69     Resp 07/17/20 1454 17     Temp 07/17/20 1454 98.8 F (37.1 C)     Temp Source 07/17/20 1454 Oral     SpO2 07/17/20 1454 98 %     Weight 07/17/20 1455 160 lb (72.6 kg)     Height 07/17/20 1455 5' 6"  (1.676 m)     Head Circumference --      Peak Flow --      Pain Score 07/17/20 1454 8     Pain Loc --      Pain Edu? --      Excl. in [place]? --    Constitutional: Alert and oriented. Well appearing and in no acute distress. Eyes: Conjunctivae are normal. PERRL. EOMI. Head: Atraumatic. Nose: No congestion/rhinnorhea. Mouth/Throat: Mucous membranes are moist.  There is tenderness to palpation the external aspect of the left lower mandible with associated large amount of soft tissue swelling.  No erythema or warmth to the area.  Upon internal inspection, there is a broken tooth on the left lower mandible with surrounding periapical abscess.  No pain or swelling under the tongue.  Pain and swelling does not come to the midline under the mandible. Neck: No stridor.   Lymphatic: No cervical lymphadenopathy Cardiovascular: Normal rate, regular rhythm. Grossly normal heart sounds.  Good peripheral  circulation. Respiratory: Normal respiratory effort.  No retractions. Lungs CTAB. Gastrointestinal: Soft and nontender. No distention. No abdominal bruits. No CVA tenderness. Musculoskeletal: No lower extremity tenderness nor edema.  No joint effusions. Neurologic:  Normal speech and language. No gross focal neurologic deficits are appreciated. No  gait instability. Skin:  Skin is warm, dry and intact. No rash noted. Psychiatric: Mood and affect are normal. Speech and behavior are normal.  ____________________________________________   LABS (all labs ordered are listed, but only abnormal results are displayed)  Labs Reviewed  COMPREHENSIVE METABOLIC PANEL - Abnormal; Notable for the following components:      Result Value   CO2 20 (*)    BUN 41 (*)    Creatinine, Ser 1.65 (*)    GFR, Estimated 38 (*)    All other components within normal limits  CBC WITH DIFFERENTIAL/PLATELET - Abnormal; Notable for the following components:   WBC 12.0 (*)    Hemoglobin 15.1 (*)    Neutro Abs 9.4 (*)    All other components within normal limits   ____________________________________________  RADIOLOGY  Official radiology report(s): CT Maxillofacial W Contrast  Result Date: 07/17/2020 CLINICAL DATA:  Left lower pain and facial swelling. EXAM: CT MAXILLOFACIAL WITH CONTRAST TECHNIQUE: Multidetector CT imaging of the maxillofacial structures was performed with intravenous contrast. Multiplanar CT image reconstructions were also generated. CONTRAST:  75m OMNIPAQUE IOHEXOL 300 MG/ML  SOLN COMPARISON:  None. FINDINGS: Osseous: Advanced dental and periodontal disease affecting left mandibular tooth 20 with lucency around the root and lateral cortical breakthrough. Other residual dentition shows scattered caries. Minimal 2 fragment of the left maxillary molar. Orbits: Normal Sinuses: Clear Soft tissues: Left facial swelling consistent with cellulitis. Small subperiosteal or adjacent crescentic fluid  collection on the outer edge of the mandible adjacent to the lateral cortical erosion of tooth 20. Limited intracranial: Normal IMPRESSION: Advanced dental and periodontal disease affecting left mandibular tooth 20 with lucency around the root and lateral cortical breakthrough. Small subperiosteal or adjacent crescentic fluid collection on the outer edge of the mandible adjacent to the lateral cortical erosion of tooth 20. Nonspecific edema pattern within the left lower face consistent with regional cellulitis Electronically Signed   By: MNelson ChimesM.D.   On: 07/17/2020 17:38    ____________________________________________   INITIAL IMPRESSION / ASSESSMENT AND PLAN / ED COURSE  As part of my medical decision making, I reviewed the following data within the eDun[place]notes reviewed and incorporated, Labs reviewed, Notes from prior ED visits and [place] Controlled Substance Database        Patient is a 50year old female who presents to the emergency department for evaluation of left lower mandible pain and swelling.  See HPI for further details.  In triage, patient is afebrile has grossly normal vital signs.  On physical exam, she does have external soft tissue swelling as well as periapical abscess around a left lower incisor.  She does not seem to have any pain under the tongue or pain under the midline of the mandible, however given the persistence of amount of time this has been present as well as the amount of soft tissue swelling, further evaluation will be obtained.  CMP does show stable kidney disease, but is otherwise normal.  CBC does demonstrate a mildly elevated white count of 12.0, otherwise is grossly within normal limits.  Will evaluate with CT maxillofacial with contrast.  This demonstrates infection around the left lower incisor with surrounding cellulitis.  There is no Ludwig's or lemmiere's.  Will initiate treatment with oral Augmentin and topical viscous  lidocaine for pain control.  Advised the patient she should have close follow-up with dentist for probable removal of that tooth.  Patient is amenable with this plan, return precautions were discussed and  she stable this time for outpatient follow-up.      ____________________________________________   FINAL CLINICAL IMPRESSION(S) / ED DIAGNOSES  Final diagnoses:  Dental abscess     ED Discharge Orders         Ordered    lidocaine (XYLOCAINE) 2 % solution  As needed        07/17/20 1819    amoxicillin-clavulanate (AUGMENTIN) 875-125 MG tablet  2 times daily        07/17/20 1819          *Please note:  Angelica Herrera was evaluated in Emergency Department on 07/17/2020 for the symptoms described in the history of present illness. She was evaluated in the context of the global COVID-19 pandemic, which necessitated consideration that the patient might be at risk for infection with the SARS-CoV-2 virus that causes COVID-19. Institutional protocols and algorithms that pertain to the evaluation of patients at risk for COVID-19 are in a state of rapid change based on information released by regulatory bodies including the CDC and federal and state organizations. These policies and algorithms were followed during the patient's care in the ED.  Some ED evaluations and interventions may be delayed as a result of limited staffing during and the pandemic.*   Note:  This document was prepared using Dragon voice recognition software and may include unintentional dictation errors.   Marlana Salvage, PA 07/17/20 2021    Lucrezia Starch, MD 07/17/20 2053

## 2020-07-17 NOTE — ED Triage Notes (Signed)
Pt c/o left lower tooth pain with facial swelling for the past couple of days.

## 2020-07-18 ENCOUNTER — Telehealth: Payer: Self-pay

## 2020-07-18 NOTE — Telephone Encounter (Signed)
Transition Care Management Unsuccessful Follow-up Telephone Call  Date of discharge and from where:  07/17/2020 [facility].   Attempts:  1st Attempt  Reason for unsuccessful TCM follow-up call:  Left voice message

## 2020-07-19 NOTE — Progress Notes (Deleted)
Cardiology Office Note    Date:  07/19/2020   ID:  Angelica Herrera, DOB 1970-06-09, MRN [id number]  PCP:  Delsa Grana, PA-C  Cardiologist:  Kathlyn Sacramento, MD  Electrophysiologist:  None   Chief Complaint: Follow-up  History of Present Illness:   Angelica Herrera is a 50 y.o. female with history of CAD with anterolateral STEMI status post PCI/DES to the left main as outlined below, HFrEF secondary to ICM, CKD stage II, NSVT, RAS status post left renal artery stenting in 11/2018, HTN, HLD, hypothyroidism, and obesity who presents for follow-up of her CAD and cardiomyopathy.  She was admitted to the hospital in 01/2012 with an anterolateral ST EMI.  Emergent cardiac cath showed occlusion of the left main coronary artery.  There was significant difficulty engaging the left main, though she ultimately underwent successful PCI/DES to the left main.  She also had PTCA done to the LAD due to embolization from the left main.  Post procedure, she had significant hemodynamic instability with VT and hypotension.  Initially, she was noted to have severe MR, however subsequent echo showed an EF of 45% with no significant MR.  She was lost to follow-up from 2013 through 2017.  Upon reestablishing with our group in 2017, she continued to smoke and had been off her medications.  In 2017 she had atypical chest pain with pharmacological stress test showing prior anterior infarct with mild peri-infarct ischemia with an EF of 33%.  Repeat cardiac cath at that time showed a patent left main stent with moderate mid LAD stenosis and an occluded D2 with faint collaterals.  The RCA was very large with mild mid stenosis.  EF was 35 to 40% with anterior wall hypokinesis.  Continued medical therapy was advised.  Repeat nuclear stress testing in 02/2017, for recurrent chest pain, showed a similar finding of prior anterior infarct with mild peri-infarct ischemia.  Most recent echo from 02/2019 showed an EF of 35 to 40% with  moderate pulmonary hypertension.  She was last seen virtually in 12/2019 and was doing well from a cardiac perspective.  At that time hydralazine was transition to [facility].  Follow-up renal artery duplex was recommended, though not completed.  She was recently seen in the [facility] ED on 07/17/2020 for a dental abscess/regional cellulitis.  ***   Labs independently reviewed: 07/2020 - Hgb 15.1, PLT 213, potassium 3.6, BUN 41, serum creatinine 1.65, albumin 3.9, AST/ALT normal 02/2020 - TSH normal 08/2019 - TC 140, TG 80, HDL 62, LDL 62 10/2018 - A1c 5.9  Past Medical History:  Diagnosis Date  . Arthritis    gout  . CAD (coronary artery disease)    a. 02/2012 s/p anterolateral STEMI/Cath/PCI: LM 100 (3.0x15 DES ), LAD sm, severely diseased (PTCA w/ 1.29mm balloon), LCX nl, RI moderate size, nl, RCA 30-77m, AM/PDA/PLA nl, R->L collats noted; b. cath 08/03/15: widely patent ostLM stent at 10%, mLAD 60%,  mRCA 30%, EF 35-40%, ant wall HK, mildly elevated LVEDP; c. 02/2017 MV: ant infarct, mild peri-infarct isch (similar to 2017 study)->Med Rx.  . Chronic combined systolic (congestive) and diastolic (congestive) heart failure ([place])    a. 02/2017 Echo: EF 30-35%, diff HK, sev HK/poss AK of ant and lat walls. Gr1 DD. Mild to mod MR. Nl RV fxn. Mod TR.  . CKD (chronic kidney disease), stage IV ([place])   . HTN (hypertension)   . Hyperlipidemia   . Hypothyroidism   . Ischemic cardiomyopathy    a. 02/2012 Echo:  EF 40-45%, ant/lat HK, Gr1 DD, Mild MR, PASP 33mmHg; b. 02/2017 Echo: EF 30-35%.  . NSVT (nonsustained ventricular tachycardia) ([place])    a. 02/2012 post-MI  . Obesity   . Pancreatitis   . S/P primary angioplasty with coronary stent 03/04/2018   Dual antiplatelet therapy INDEFINITELY per cardiology note Nov 2019  . Tobacco abuse     Past Surgical History:  Procedure Laterality Date  . ABDOMINAL HYSTERECTOMY    . CARDIAC CATHETERIZATION  2013   [facility] s/p stent  . CARDIAC CATHETERIZATION Left  08/03/2015   Procedure: Left Heart Cath and Coronary Angiography;  Surgeon: Wellington Hampshire, MD;  Location: [facility];  Service: Cardiovascular;  Laterality: Left;  . DILATION AND CURETTAGE OF UTERUS    . LEFT HEART CATHETERIZATION WITH CORONARY ANGIOGRAM N/A 02/11/2012   Procedure: LEFT HEART CATHETERIZATION WITH CORONARY ANGIOGRAM;  Surgeon: [name], MD;  Location: [facility] CATH LAB;  Service: Cardiovascular;  Laterality: N/A;  . RENAL INTERVENTION N/A 11/17/2018   Procedure: RENAL INTERVENTION;  Surgeon: Katha Cabal, MD;  Location: [facility];  Service: Cardiovascular;  Laterality: N/A;    Current Medications: No outpatient medications have been marked as taking for the 07/20/20 encounter (Appointment) with [name], PA-C.   Current Facility-Administered Medications for the 07/20/20 encounter (Appointment) with [name], PA-C  Medication  . mometasone-formoterol (DULERA) 200-5 MCG/ACT inhaler 2 puff    Allergies:   Patient has no known allergies.   Social History   Socioeconomic History  . Marital status: Legally Separated    Spouse name: Not on file  . Number of children: Not on file  . Years of education: Not on file  . Highest education level: Not on file  Occupational History  . Not on file  Tobacco Use  . Smoking status: Current Every Day Smoker    Packs/day: 0.25    Years: 30.00    Pack years: 7.50    Types: Cigarettes  . Smokeless tobacco: Never Used  Vaping Use  . Vaping Use: Never used  Substance and Sexual Activity  . Alcohol use: Not Currently    Comment: Says she previously drank socially - never a heavy drinker.  No etoh since 08/2018.  Marland Kitchen Drug use: No    Types: Marijuana  . Sexual activity: Not on file  Other Topics Concern  . Not on file  Social History Narrative   Lives in [place] w/ her dtr.  Does not routinely exercise.  Disabled/doesn't work.   Social Determinants of Health   Financial Resource Strain: Not on file   Food Insecurity: Not on file  Transportation Needs: Not on file  Physical Activity: Not on file  Stress: Not on file  Social Connections: Not on file     Family History:  The patient's family history includes AAA (abdominal aortic aneurysm) in her father; Cancer in her paternal grandfather; Diabetes in her mother; Hyperlipidemia in her mother; Hypertension in her brother, brother, daughter, father, mother, sister, and sister; Kidney disease in her mother; Supraventricular tachycardia in her sister; Thyroid disease in her father.  ROS:   ROS   EKGs/Labs/Other Studies Reviewed:    Studies reviewed were summarized above. The additional studies were reviewed today:  2D echo 02/2019: 1. Left ventricular ejection fraction, by visual estimation, is 35 to  40%. The left ventricle has moderately decreased function. There is no  left ventricular hypertrophy. Anterior and Septal wall hypokinesis.  2. Left ventricular diastolic parameters  are consistent with Grade II  diastolic dysfunction (pseudonormalization).  3. Mildly dilated left ventricular internal cavity size.  4. Global right ventricle has normal systolic function.The right  ventricular size is mildly enlarged. No increase in right ventricular wall  thickness.  5. Left atrial size was mild-moderately dilated.  6. Right atrial size was mildly dilated.  7. Moderately elevated pulmonary artery systolic pressure.  8. The tricuspid regurgitant velocity is 2.80 m/s, and with an assumed  right atrial pressure of 20 mmHg, the estimated right ventricular systolic  pressure is moderately elevated at 51.2 mmHg.  9. Small pericardial effusion.  __________  Carlton Adam MPI 02/2017:  There was no ST segment deviation noted during stress.  No T wave inversion was noted during stress.  Defect 1: There is a medium defect of severe severity present in the basal anterior, basal anterolateral, mid anterior and mid anterolateral  location.  This is an intermediate risk study.  The left ventricular ejection fraction is moderately decreased (30-44%).  Findings consistent with prior myocardial infarction with mild peri-infarct ischemia. __________  2D echo 02/2017: - Left ventricle: The cavity size was normal. Systolic function was  moderately to severely reduced. The estimated ejection fraction  was in the range of 30% to 35%. Diffuse hypokinesis with severe  hypokinesis /possible akinesis of the anterior and lateral walls.  Doppler parameters are consistent with abnormal left ventricular  relaxation (grade 1 diastolic dysfunction).  - Mitral valve: There was mild to moderate regurgitation.  - Left atrium: The atrium was normal in size.  - Right ventricle: Systolic function was normal.  - Tricuspid valve: There was moderate regurgitation.  - Pulmonary arteries: Systolic pressure was mildly elevated. __________  ABIs 02/2017: Right: Resting right ankle-brachial index is within normal range. No  evidence of significant right lower extremity arterial disease. The right  toe-brachial index is abnormal. TBIs are unreliable. PPG tracings appear  dampened.   Left: Resting left ankle-brachial index is within normal range. No  evidence of significant left lower extremity arterial disease. The left  toe-brachial index is abnormal. TBIs are unreliable. PPG tracings appear  dampened.  __________  2D echo 09/2015: - Left ventricle: The cavity size was normal. Systolic function was  moderately reduced. The estimated ejection fraction was in the  range of 35% to 40%. Hypokinesis of the anterior myocardium.  Hypokinesis of the anteroseptal myocardium. Doppler parameters  are consistent with abnormal left ventricular relaxation (grade 1  diastolic dysfunction).  - Mitral valve: Calcified annulus. There was mild regurgitation.  - Left atrium: The atrium was normal in size.  - Right ventricle:  Systolic function was normal.  - Pulmonary arteries: Systolic pressure was within the normal  range.  __________  LHC 07/2015:  Ost LM to LM lesion, 10% stenosed. The lesion was previously treated with a stent (unknown type).  Mid LAD lesion, 60% stenosed.  Mid RCA lesion, 30% stenosed.  There is moderate left ventricular systolic dysfunction.   1. Widely patent ostial left main stent with moderate mid LAD stenosis and occluded second diagonal with faint collaterals. The right coronary artery is very large with mild mid stenosis. 2. Moderately reduced LV systolic function with an ejection fraction of 35-40% with significant anterior wall hypokinesis. Mildly elevated left ventricular end-diastolic pressure. 3. Significant radial artery spasm which required increased amounts of sedation.  Recommendations: Continue medical therapy for coronary artery disease and chronic systolic heart failure. We will need to transition her from clonidine to an ACE inhibitor or  ARB with close monitoring of renal function. __________  Carlton Adam MPI 07/2015: Pharmacological myocardial perfusion imaging study with attenuation corrected images suggesting mild anterior wall ischemia, mid to apical region Nonattenuation corrected images also with mild mid to apical anterior wall ischemia Patient attempted exercise on bruce protocol though unable to reach target heart rate, changed to lexiscan Anteroseptal wall hypokinesis noted, EF estimated at 33% No EKG changes concerning for ischemia at peak stress or in recovery. Moderate risk scan with mild ischemia as above.   EKG:  EKG is ordered today.  The EKG ordered today demonstrates ***  Recent Labs: 02/18/2020: TSH 4.23 07/17/2020: ALT 18; BUN 41; Creatinine, Ser 1.65; Hemoglobin 15.1; Platelets 213; Potassium 3.6; Sodium 138  Recent Lipid Panel    Component Value Date/Time   CHOL 140 09/02/2019 1030   CHOL 136 02/27/2018 1207   TRIG 80 09/02/2019 1030    HDL 62 09/02/2019 1030   HDL 53 02/27/2018 1207   CHOLHDL 2.3 09/02/2019 1030   VLDL 15 11/13/2018 0628   LDLCALC 62 09/02/2019 1030    PHYSICAL EXAM:    VS:  There were no vitals taken for this visit.  BMI: There is no height or weight on file to calculate BMI.  Physical Exam  Wt Readings from Last 3 Encounters:  07/17/20 160 lb (72.6 kg)  03/17/20 187 lb 14.4 oz (85.2 kg)  02/18/20 177 lb (80.3 kg)     ASSESSMENT & PLAN:   1. CAD involving the native coronary arteries without***angina:  2. HFrEF secondary to ICM:  3. HTN with RAS: Blood pressure ***  4. HLD: LDL 62 in 08/2019.  ***  5. Tobacco use:  6. CKD stage II:  Disposition: F/u with Dr. Fletcher Anon or an APP in ***.   Medication Adjustments/Labs and Tests Ordered: Current medicines are reviewed at length with the patient today.  Concerns regarding medicines are outlined above. Medication changes, Labs and Tests ordered today are summarized above and listed in the Patient Instructions accessible in Encounters.   Signed, [name], PA-C 07/19/2020 11:03 AM     [facility] [address] [place] [address] [place], [place] [address] [phone]

## 2020-07-20 ENCOUNTER — Ambulatory Visit: Payer: Medicaid Other | Admitting: Physician Assistant

## 2020-07-20 NOTE — Telephone Encounter (Signed)
Transition Care Management Unsuccessful Follow-up Telephone Call  Date of discharge and from where:  07/17/2020 from [facility] Attempts:  2nd Attempt  Reason for unsuccessful TCM follow-up call:  Left voice message

## 2020-07-21 NOTE — Telephone Encounter (Signed)
Transition Care Management Unsuccessful Follow-up Telephone Call  Date of discharge and from where:  07/17/2020 from [facility]  Attempts:  1st Attempt  Reason for unsuccessful TCM follow-up call:  Unable to reach patient

## 2020-07-27 ENCOUNTER — Telehealth: Payer: Self-pay

## 2020-07-27 NOTE — Telephone Encounter (Signed)
Copied from [place] [phone]. Topic: General - Other >> Jul 27, 2020  8:19 AM Keene Breath wrote: Reason for CRM: Patient called to inform the doctor that she has gout in her left foot and wanted some medication called into her local pharmacy.  Please call to confirm.  CB# [phone]

## 2020-07-27 NOTE — Telephone Encounter (Signed)
lvm to get pt scheduled for an appt with Merleen Nicely Just FNP

## 2020-08-01 NOTE — Progress Notes (Deleted)
Cardiology Office Note    Date:  08/01/2020   ID:  Angelica Herrera, DOB 08/26/1970, MRN [id number]  PCP:  Delsa Grana, PA-C  Cardiologist:  Kathlyn Sacramento, MD  Electrophysiologist:  None   Chief Complaint: Follow-up  History of Present Illness:   Angelica Herrera is a 50 y.o. female with history of CAD with anterolateral STEMI status post PCI/DES to the left main as outlined below, HFrEF secondary to ICM, CKD stage II, NSVT, RAS status post left renal artery stenting in 11/2018, HTN, HLD, hypothyroidism, and obesity who presents for follow-up of her CAD and cardiomyopathy.  She was admitted to the hospital in 01/2012 with an anterolateral ST EMI.  Emergent cardiac cath showed occlusion of the left main coronary artery.  There was significant difficulty engaging the left main, though she ultimately underwent successful PCI/DES to the left main.  She also had PTCA done to the LAD due to embolization from the left main.  Post procedure, she had significant hemodynamic instability with VT and hypotension.  Initially, she was noted to have severe MR, however subsequent echo showed an EF of 45% with no significant MR.  She was lost to follow-up from 2013 through 2017.  Upon reestablishing with our group in 2017, she continued to smoke and had been off her medications.  In 2017 she had atypical chest pain with pharmacological stress test showing prior anterior infarct with mild peri-infarct ischemia with an EF of 33%.  Repeat cardiac cath at that time showed a patent left main stent with moderate mid LAD stenosis and an occluded D2 with faint collaterals.  The RCA was very large with mild mid stenosis.  EF was 35 to 40% with anterior wall hypokinesis.  Continued medical therapy was advised.  Repeat nuclear stress testing in 02/2017, for recurrent chest pain, showed a similar finding of prior anterior infarct with mild peri-infarct ischemia.  Most recent echo from 02/2019 showed an EF of 35 to 40%  with moderate pulmonary hypertension.  She was last seen virtually in 12/2019 and was doing well from a cardiac perspective.  At that time hydralazine was transition to [facility].  Follow-up renal artery duplex was recommended, though not completed.  She was recently seen in the [facility] ED on 07/17/2020 for a dental abscess/regional cellulitis.  ***   Labs independently reviewed: 07/2020 - Hgb 15.1, PLT 213, potassium 3.6, BUN 41, serum creatinine 1.65, albumin 3.9, AST/ALT normal 02/2020 - TSH normal 08/2019 - TC 140, TG 80, HDL 62, LDL 62 10/2018 - A1c 5.9   Past Medical History:  Diagnosis Date  . Arthritis    gout  . CAD (coronary artery disease)    a. 02/2012 s/p anterolateral STEMI/Cath/PCI: LM 100 (3.0x15 DES ), LAD sm, severely diseased (PTCA w/ 1.23mm balloon), LCX nl, RI moderate size, nl, RCA 30-46m, AM/PDA/PLA nl, R->L collats noted; b. cath 08/03/15: widely patent ostLM stent at 10%, mLAD 60%,  mRCA 30%, EF 35-40%, ant wall HK, mildly elevated LVEDP; c. 02/2017 MV: ant infarct, mild peri-infarct isch (similar to 2017 study)->Med Rx.  . Chronic combined systolic (congestive) and diastolic (congestive) heart failure ([place])    a. 02/2017 Echo: EF 30-35%, diff HK, sev HK/poss AK of ant and lat walls. Gr1 DD. Mild to mod MR. Nl RV fxn. Mod TR.  . CKD (chronic kidney disease), stage IV ([place])   . HTN (hypertension)   . Hyperlipidemia   . Hypothyroidism   . Ischemic cardiomyopathy    a. 02/2012  Echo: EF 40-45%, ant/lat HK, Gr1 DD, Mild MR, PASP 13mmHg; b. 02/2017 Echo: EF 30-35%.  . NSVT (nonsustained ventricular tachycardia) ([place])    a. 02/2012 post-MI  . Obesity   . Pancreatitis   . S/P primary angioplasty with coronary stent 03/04/2018   Dual antiplatelet therapy INDEFINITELY per cardiology note Nov 2019  . Tobacco abuse     Past Surgical History:  Procedure Laterality Date  . ABDOMINAL HYSTERECTOMY    . CARDIAC CATHETERIZATION  2013   [facility] s/p stent  . CARDIAC CATHETERIZATION  Left 08/03/2015   Procedure: Left Heart Cath and Coronary Angiography;  Surgeon: Wellington Hampshire, MD;  Location: [place] [facility];  Service: Cardiovascular;  Laterality: Left;  . DILATION AND CURETTAGE OF UTERUS    . LEFT HEART CATHETERIZATION WITH CORONARY ANGIOGRAM N/A 02/11/2012   Procedure: LEFT HEART CATHETERIZATION WITH CORONARY ANGIOGRAM;  Surgeon: [name], MD;  Location[facility] C[facility];  Service: Cardiovascular;  Laterality: N/A;  . RENAL INTERVENTION N/A 11/17/2018   Procedure: RENAL INTERVENTION;  Surgeon: Katha Cabal, MD;  Location: [place] [facility];  Service: Cardiovascular;  Laterality: N/A;    Current Medications: No outpatient medications have been marked as taking for the 08/04/20 encounter (Appointment) with [name], PA-C.   Current Facility-Administered Medications for the 08/04/20 encounter (Appointment) with [name], PA-C  Medication  . mometasone-formoterol (DULERA) 200-5 MCG/ACT inhaler 2 puff    Allergies:   Patient has no known allergies.   Social History   Socioeconomic History  . Marital status: Legally Separated    Spouse name: Not on file  . Number of children: Not on file  . Years of education: Not on file  . Highest education level: Not on file  Occupational History  . Not on file  Tobacco Use  . Smoking status: Current Every Day Smoker    Packs/day: 0.25    Years: 30.00    Pack years: 7.50    Types: Cigarettes  . Smokeless tobacco: Never Used  Vaping Use  . Vaping Use: Never used  Substance and Sexual Activity  . Alcohol use: Not Currently    Comment: Says she previously drank socially - never a heavy drinker.  No etoh since 08/2018.  Marland Kitchen Drug use: No    Types: Marijuana  . Sexual activity: Not on file  Other Topics Concern  . Not on file  Social History Narrative   Lives in [place] w/ her dtr.  Does not routinely exercise.  Disabled/doesn't work.   Social Determinants of Health   Financial Resource Strain: Not on  file  Food Insecurity: Not on file  Transportation Needs: Not on file  Physical Activity: Not on file  Stress: Not on file  Social Connections: Not on file     Family History:  The patient's family history includes AAA (abdominal aortic aneurysm) in her father; Cancer in her paternal grandfather; Diabetes in her mother; Hyperlipidemia in her mother; Hypertension in her brother, brother, daughter, father, mother, sister, and sister; Kidney disease in her mother; Supraventricular tachycardia in her sister; Thyroid disease in her father.  ROS:   ROS   EKGs/Labs/Other Studies Reviewed:    Studies reviewed were summarized above. The additional studies were reviewed today:  2D echo 02/2019: 1. Left ventricular ejection fraction, by visual estimation, is 35 to  40%. The left ventricle has moderately decreased function. There is no  left ventricular hypertrophy. Anterior and Septal wall hypokinesis.  2. Left ventricular diastolic  parameters are consistent with Grade II  diastolic dysfunction (pseudonormalization).  3. Mildly dilated left ventricular internal cavity size.  4. Global right ventricle has normal systolic function.The right  ventricular size is mildly enlarged. No increase in right ventricular wall  thickness.  5. Left atrial size was mild-moderately dilated.  6. Right atrial size was mildly dilated.  7. Moderately elevated pulmonary artery systolic pressure.  8. The tricuspid regurgitant velocity is 2.80 m/s, and with an assumed  right atrial pressure of 20 mmHg, the estimated right ventricular systolic  pressure is moderately elevated at 51.2 mmHg.  9. Small pericardial effusion.  __________  Carlton Adam MPI 02/2017:  There was no ST segment deviation noted during stress.  No T wave inversion was noted during stress.  Defect 1: There is a medium defect of severe severity present in the basal anterior, basal anterolateral, mid anterior and mid anterolateral  location.  This is an intermediate risk study.  The left ventricular ejection fraction is moderately decreased (30-44%).  Findings consistent with prior myocardial infarction with mild peri-infarct ischemia. __________  2D echo 02/2017: - Left ventricle: The cavity size was normal. Systolic function was  moderately to severely reduced. The estimated ejection fraction  was in the range of 30% to 35%. Diffuse hypokinesis with severe  hypokinesis /possible akinesis of the anterior and lateral walls.  Doppler parameters are consistent with abnormal left ventricular  relaxation (grade 1 diastolic dysfunction).  - Mitral valve: There was mild to moderate regurgitation.  - Left atrium: The atrium was normal in size.  - Right ventricle: Systolic function was normal.  - Tricuspid valve: There was moderate regurgitation.  - Pulmonary arteries: Systolic pressure was mildly elevated. __________  ABIs 02/2017: Right: Resting right ankle-brachial index is within normal range. No  evidence of significant right lower extremity arterial disease. The right  toe-brachial index is abnormal. TBIs are unreliable. PPG tracings appear  dampened.   Left: Resting left ankle-brachial index is within normal range. No  evidence of significant left lower extremity arterial disease. The left  toe-brachial index is abnormal. TBIs are unreliable. PPG tracings appear  dampened.  __________  2D echo 09/2015: - Left ventricle: The cavity size was normal. Systolic function was  moderately reduced. The estimated ejection fraction was in the  range of 35% to 40%. Hypokinesis of the anterior myocardium.  Hypokinesis of the anteroseptal myocardium. Doppler parameters  are consistent with abnormal left ventricular relaxation (grade 1  diastolic dysfunction).  - Mitral valve: Calcified annulus. There was mild regurgitation.  - Left atrium: The atrium was normal in size.  - Right ventricle:  Systolic function was normal.  - Pulmonary arteries: Systolic pressure was within the normal  range.  __________  LHC 07/2015:  Ost LM to LM lesion, 10% stenosed. The lesion was previously treated with a stent (unknown type).  Mid LAD lesion, 60% stenosed.  Mid RCA lesion, 30% stenosed.  There is moderate left ventricular systolic dysfunction.   1. Widely patent ostial left main stent with moderate mid LAD stenosis and occluded second diagonal with faint collaterals. The right coronary artery is very large with mild mid stenosis. 2. Moderately reduced LV systolic function with an ejection fraction of 35-40% with significant anterior wall hypokinesis. Mildly elevated left ventricular end-diastolic pressure. 3. Significant radial artery spasm which required increased amounts of sedation.  Recommendations: Continue medical therapy for coronary artery disease and chronic systolic heart failure. We will need to transition her from clonidine to an ACE inhibitor  or ARB with close monitoring of renal function. __________  Carlton Adam MPI 07/2015: Pharmacological myocardial perfusion imaging study with attenuation corrected images suggesting mild anterior wall ischemia, mid to apical region Nonattenuation corrected images also with mild mid to apical anterior wall ischemia Patient attempted exercise on bruce protocol though unable to reach target heart rate, changed to lexiscan Anteroseptal wall hypokinesis noted, EF estimated at 33% No EKG changes concerning for ischemia at peak stress or in recovery. Moderate risk scan with mild ischemia as above.    EKG:  EKG is ordered today.  The EKG ordered today demonstrates ***  Recent Labs: 02/18/2020: TSH 4.23 07/17/2020: ALT 18; BUN 41; Creatinine, Ser 1.65; Hemoglobin 15.1; Platelets 213; Potassium 3.6; Sodium 138  Recent Lipid Panel    Component Value Date/Time   CHOL 140 09/02/2019 1030   CHOL 136 02/27/2018 1207   TRIG 80 09/02/2019 1030    HDL 62 09/02/2019 1030   HDL 53 02/27/2018 1207   CHOLHDL 2.3 09/02/2019 1030   VLDL 15 11/13/2018 0628   LDLCALC 62 09/02/2019 1030    PHYSICAL EXAM:    VS:  There were no vitals taken for this visit.  BMI: There is no height or weight on file to calculate BMI.  Physical Exam  Wt Readings from Last 3 Encounters:  07/17/20 160 lb (72.6 kg)  03/17/20 187 lb 14.4 oz (85.2 kg)  02/18/20 177 lb (80.3 kg)     ASSESSMENT & PLAN:   1. CAD involving the native coronary arteries without***angina: ***  2. HFrEF secondary to ICM: ***  3. HTN with RAS: Blood pressure ***  4. HLD: LDL 62 in 08/2019.  ***  5. Tobacco use: ***  6. CKD stage II: ***   Disposition: F/u with Dr. Fletcher Anon or an APP in ***.   Medication Adjustments/Labs and Tests Ordered: Current medicines are reviewed at length with the patient today.  Concerns regarding medicines are outlined above. Medication changes, Labs and Tests ordered today are summarized above and listed in the Patient Instructions accessible in Encounters.   Signed, [name], PA-C 08/01/2020 3:30 PM     [address] [place], [place] [address] [phone]

## 2020-08-04 ENCOUNTER — Ambulatory Visit: Payer: Medicaid Other | Admitting: Physician Assistant

## 2020-08-07 ENCOUNTER — Encounter: Payer: Self-pay | Admitting: Physician Assistant

## 2020-08-09 NOTE — Telephone Encounter (Signed)
Left voicemail message that if she should want to reschedule her kidney ultrasound and follow up appointment to please give [place] a call back here to the office and we will be happy to get that scheduled.

## 2020-08-13 DIAGNOSIS — Z419 Encounter for procedure for purposes other than remedying health state, unspecified: Secondary | ICD-10-CM | POA: Diagnosis not present

## 2020-08-15 ENCOUNTER — Other Ambulatory Visit: Payer: Self-pay | Admitting: Family Medicine

## 2020-08-15 ENCOUNTER — Other Ambulatory Visit: Payer: Self-pay | Admitting: Cardiovascular Disease

## 2020-08-15 DIAGNOSIS — G8929 Other chronic pain: Secondary | ICD-10-CM

## 2020-08-15 NOTE — Telephone Encounter (Signed)
Patient needs appt for refills. Please call pt to schedule appointment. Thank you! (Cancelled and no showed last 2 scheduled appts)

## 2020-08-15 NOTE — Telephone Encounter (Signed)
LVM for patient to call and schedule

## 2020-08-16 NOTE — Telephone Encounter (Signed)
Attempted to schedule.  LMOV to call office.  ° °

## 2020-08-22 NOTE — Telephone Encounter (Signed)
    3 attempts to schedule fu appt.  Closing encounter    Attempted to schedule.  LMOV to call office.

## 2020-09-01 ENCOUNTER — Other Ambulatory Visit: Payer: Self-pay | Admitting: Cardiovascular Disease

## 2020-09-01 NOTE — Telephone Encounter (Signed)
Pt overdue for 4 month f/u last seen 12/2019. Please contact pt for future appointment. Pt needing refills, Thank you.

## 2020-09-04 NOTE — Telephone Encounter (Signed)
Attempted to schedule.  LMOV to call office.  ° °

## 2020-09-13 DIAGNOSIS — Z419 Encounter for procedure for purposes other than remedying health state, unspecified: Secondary | ICD-10-CM | POA: Diagnosis not present

## 2020-09-23 ENCOUNTER — Other Ambulatory Visit: Payer: Self-pay | Admitting: Family Medicine

## 2020-09-23 ENCOUNTER — Other Ambulatory Visit: Payer: Self-pay | Admitting: Cardiovascular Disease

## 2020-09-23 DIAGNOSIS — J329 Chronic sinusitis, unspecified: Secondary | ICD-10-CM

## 2020-10-04 NOTE — Telephone Encounter (Signed)
Attempted to schedule.  LMOV to call office.  ° °

## 2020-10-13 DIAGNOSIS — Z419 Encounter for procedure for purposes other than remedying health state, unspecified: Secondary | ICD-10-CM | POA: Diagnosis not present

## 2020-10-18 ENCOUNTER — Telehealth: Payer: Self-pay | Admitting: Family Medicine

## 2020-10-18 ENCOUNTER — Other Ambulatory Visit: Payer: Self-pay | Admitting: Family Medicine

## 2020-10-18 ENCOUNTER — Other Ambulatory Visit: Payer: Self-pay | Admitting: Cardiovascular Disease

## 2020-10-18 DIAGNOSIS — Z9889 Other specified postprocedural states: Secondary | ICD-10-CM

## 2020-10-18 DIAGNOSIS — E038 Other specified hypothyroidism: Secondary | ICD-10-CM

## 2020-10-18 DIAGNOSIS — G8929 Other chronic pain: Secondary | ICD-10-CM

## 2020-10-18 DIAGNOSIS — I739 Peripheral vascular disease, unspecified: Secondary | ICD-10-CM

## 2020-10-18 DIAGNOSIS — I701 Atherosclerosis of renal artery: Secondary | ICD-10-CM

## 2020-10-19 NOTE — Telephone Encounter (Signed)
Called pt two times and got hung up both times, with the second phone call the pt answered and states" what do you want bro" and I told her who I was and where I was calling from and she hangup again. No appt was made

## 2020-11-02 ENCOUNTER — Other Ambulatory Visit: Payer: Self-pay | Admitting: Cardiovascular Disease

## 2020-11-02 NOTE — Telephone Encounter (Signed)
Pt overdue for follow up. Pt hasn't been seen in over a yr. Pt needing refills. Please contact pt for future appointment.

## 2020-11-13 DIAGNOSIS — Z419 Encounter for procedure for purposes other than remedying health state, unspecified: Secondary | ICD-10-CM | POA: Diagnosis not present

## 2020-11-20 NOTE — Telephone Encounter (Signed)
Attempted to schedule.  

## 2020-12-14 DIAGNOSIS — Z419 Encounter for procedure for purposes other than remedying health state, unspecified: Secondary | ICD-10-CM | POA: Diagnosis not present

## 2021-01-13 DIAGNOSIS — Z419 Encounter for procedure for purposes other than remedying health state, unspecified: Secondary | ICD-10-CM | POA: Diagnosis not present

## 2021-01-31 ENCOUNTER — Other Ambulatory Visit: Payer: Self-pay

## 2021-01-31 ENCOUNTER — Ambulatory Visit (INDEPENDENT_AMBULATORY_CARE_PROVIDER_SITE_OTHER): Payer: Medicaid Other | Admitting: Nurse Practitioner

## 2021-01-31 ENCOUNTER — Encounter: Payer: Self-pay | Admitting: Nurse Practitioner

## 2021-01-31 ENCOUNTER — Other Ambulatory Visit: Payer: Self-pay | Admitting: Family Medicine

## 2021-01-31 VITALS — BP 138/88 | HR 84 | Temp 98.1°F | Resp 16 | Ht 66.0 in | Wt 189.8 lb

## 2021-01-31 DIAGNOSIS — Z13220 Encounter for screening for lipoid disorders: Secondary | ICD-10-CM

## 2021-01-31 DIAGNOSIS — Z1211 Encounter for screening for malignant neoplasm of colon: Secondary | ICD-10-CM | POA: Diagnosis not present

## 2021-01-31 DIAGNOSIS — G8929 Other chronic pain: Secondary | ICD-10-CM

## 2021-01-31 DIAGNOSIS — F3341 Major depressive disorder, recurrent, in partial remission: Secondary | ICD-10-CM | POA: Diagnosis not present

## 2021-01-31 DIAGNOSIS — Z131 Encounter for screening for diabetes mellitus: Secondary | ICD-10-CM

## 2021-01-31 DIAGNOSIS — N1832 Chronic kidney disease, stage 3b: Secondary | ICD-10-CM | POA: Diagnosis not present

## 2021-01-31 DIAGNOSIS — E038 Other specified hypothyroidism: Secondary | ICD-10-CM

## 2021-01-31 DIAGNOSIS — J441 Chronic obstructive pulmonary disease with (acute) exacerbation: Secondary | ICD-10-CM | POA: Diagnosis not present

## 2021-01-31 DIAGNOSIS — F411 Generalized anxiety disorder: Secondary | ICD-10-CM

## 2021-01-31 DIAGNOSIS — I10 Essential (primary) hypertension: Secondary | ICD-10-CM

## 2021-01-31 DIAGNOSIS — M542 Cervicalgia: Secondary | ICD-10-CM | POA: Diagnosis not present

## 2021-01-31 DIAGNOSIS — N184 Chronic kidney disease, stage 4 (severe): Secondary | ICD-10-CM | POA: Diagnosis not present

## 2021-01-31 MED ORDER — GABAPENTIN 300 MG PO CAPS: ORAL_CAPSULE | ORAL | 1 refills | Status: DC

## 2021-01-31 MED ORDER — LEVOTHYROXINE SODIUM 112 MCG PO TABS: ORAL_TABLET | ORAL | 1 refills | Status: DC

## 2021-01-31 MED ORDER — PAROXETINE HCL 40 MG PO TABS: ORAL_TABLET | ORAL | 1 refills | Status: DC

## 2021-01-31 NOTE — Progress Notes (Signed)
BP 138/88   Pulse 84   Temp 98.1 F (36.7 C) (Oral)   Resp 16   Ht _0  (1.676 m)   Wt 189 lb 12.8 oz (86.1 kg)   SpO2 99%   BMI 30.63 kg/m    Subjective:    Patient ID: Angelica Herrera, female    DOB: 11-Aug-1970, 50 y.o.   MRN: [id number]  HPI: Angelica Herrera is a 50 y.o. female, here alone  Chief Complaint  Patient presents with   Depression    Discuss medication   Depression/anxiety:  She has been out of her Paxil for about a week or so.  She says that since running out of her medication her depression has been out of control.  She says she has not been able to stop crying.  She says that recently her father was diagnosed with cancer and that two girls that were friends with her daughter died from a drug overdose.  She has done counseling before but is not interested in it now.  She denies any suicidal thoughts.  PHQ9 score is 21 today.  GAD score today is 18. We will refill her paxil and she will follow up in a month regarding how she is doing.   Hypothyroidism:  She has been on levothyroxine 112 mcg for a long period of time.  She says that she has been out of this medication for about a week too.  She denies any palpitations, hot and cold intolerance or changes in skin.  Will get labs today  Chronic neck pain:  She says her neck has bothered her so long she does not remember when it started. She describes the pain as sharp.  She says when she is taking her gabapentin her neck pain is tolerable but she has been out for about a week.  Will refill gabapentin.   HTN: Her blood pressure today in the office is 138/88.  She is currently on sacubitril-valstartan 24-26 mg daily.  She says she gets this medication from her cardiologist.  She says she is supposed to take it twice a day but sometimes she forgets to take her afternoon dose. She denies any chest pain or shortness of breath.   CKDIII:  Will get labs today.  Last eGFR was 38 in 07/17/2020.    COPD: She says she has  not had any issues with breathing.  She says she uses her Dulera when she remembers.  Depression screen [facility] 2/9 01/31/2021 03/17/2020 02/18/2020 09/02/2019 04/20/2019  Decreased Interest _1 0 2  Down, Depressed, Hopeless _2 0 1  PHQ - 2 Score _3 0 3  Altered sleeping _4 0 2  Tired, decreased energy _5 0 3  Change in appetite _6 0 2  Feeling bad or failure about yourself  3 0 2 0 1  Trouble concentrating _7 0 3  Moving slowly or fidgety/restless 0 3 1 0 1  Suicidal thoughts 0 0 0 0 1  PHQ-9 Score _8 0 16  Difficult doing work/chores Very difficult Somewhat difficult Very difficult Not difficult at all Not difficult at all  Some recent data might be hidden    GAD 7 : Generalized Anxiety Score 01/31/2021 03/17/2020 02/18/2020 08/20/2017  Nervous, Anxious, on Edge _9 0  Control/stop worrying _10 0  Worry too much - different things _11 0  Trouble relaxing 3  _0 Restless 0 3 2 0  Easily annoyed or irritable _1 0  Afraid - awful might happen _2 0  Total GAD 7 Score _3 Anxiety Difficulty Very difficult - Somewhat difficult Not difficult at all     Relevant past medical, surgical, family and social history reviewed and updated as indicated. Interim medical history since our last visit reviewed. Allergies and medications reviewed and updated.  Review of Systems  Constitutional: Negative for fever or weight change.  Respiratory: Negative for cough and shortness of breath.   Cardiovascular: Negative for chest pain or palpitations.  Gastrointestinal: Negative for abdominal pain, no bowel changes.  Musculoskeletal: Negative for gait problem or joint swelling. Positive for neck pain Skin: Negative for rash.  Neurological: Negative for dizziness or headache.  No other specific complaints in a complete review of systems (except as listed in HPI above).      Objective:    BP 138/88   Pulse 84   Temp 98.1 F (36.7 C) (Oral)   Resp 16   Ht 5'  6" (1.676 m)   Wt 189 lb 12.8 oz (86.1 kg)   SpO2 99%   BMI 30.63 kg/m   Wt Readings from Last 3 Encounters:  01/31/21 189 lb 12.8 oz (86.1 kg)  07/17/20 160 lb (72.6 kg)  03/17/20 187 lb 14.4 oz (85.2 kg)    Physical Exam  Constitutional: Patient appears well-developed and well-nourished. No distress.  HEENT: head atraumatic, normocephalic, pupils equal and reactive to light, neck supple Cardiovascular: Normal rate, regular rhythm and normal heart sounds.  No murmur heard. No BLE edema. Pulmonary/Chest: Effort normal and breath sounds normal. No respiratory distress. Abdominal: Soft.  There is no tenderness. Psychiatric: Patient has a depressed mood and affect. behavior is normal. Judgment and thought content normal.   Results for orders placed or performed during the hospital encounter of 07/17/20  Comprehensive metabolic panel  Result Value Ref Range   Sodium 138 135 - 145 mmol/L   Potassium 3.6 3.5 - 5.1 mmol/L   Chloride 105 98 - 111 mmol/L   CO2 20 (L) 22 - 32 mmol/L   Glucose, Bld 97 70 - 99 mg/dL   BUN 41 (H) 6 - 20 mg/dL   Creatinine, Ser 1.65 (H) 0.44 - 1.00 mg/dL   Calcium 9.1 8.9 - 10.3 mg/dL   Total Protein 7.7 6.5 - 8.1 g/dL   Albumin 3.9 3.5 - 5.0 g/dL   AST 17 15 - 41 U/L   ALT 18 0 - 44 U/L   Alkaline Phosphatase 82 38 - 126 U/L   Total Bilirubin 1.1 0.3 - 1.2 mg/dL   GFR, Estimated 38 (L) >60 mL/min   Anion gap 13 5 - 15  CBC with Differential  Result Value Ref Range   WBC 12.0 (H) 4.0 - 10.5 K/uL   RBC 4.89 3.87 - 5.11 MIL/uL   Hemoglobin 15.1 (H) 12.0 - 15.0 g/dL   HCT 45.3 36.0 - 46.0 %   MCV 92.6 80.0 - 100.0 fL   MCH 30.9 26.0 - 34.0 pg   MCHC 33.3 30.0 - 36.0 g/dL   RDW 13.9 11.5 - 15.5 %   Platelets 213 150 - 400 K/uL   nRBC 0.0 0.0 - 0.2 %   Neutrophils Relative % 79 %   Neutro Abs 9.4 (H) 1.7 - 7.7 K/uL   Lymphocytes Relative 10 %   Lymphs Abs 1.2 0.7 - 4.0  K/uL   Monocytes Relative 8 %   Monocytes Absolute 1.0 0.1 - 1.0 K/uL    Eosinophils Relative 3 %   Eosinophils Absolute 0.3 0.0 - 0.5 K/uL   Basophils Relative 0 %   Basophils Absolute 0.1 0.0 - 0.1 K/uL   Immature Granulocytes 0 %   Abs Immature Granulocytes 0.04 0.00 - 0.07 K/uL      Assessment & Plan:   1. Recurrent major depressive disorder (HCC)  - PARoxetine (PAXIL) 40 MG tablet; TAKE ONE (1) TABLET BY MOUTH ONCE DAILY  Dispense: 90 tablet; Refill: 1  2. GAD (generalized anxiety disorder)  - PARoxetine (PAXIL) 40 MG tablet; TAKE ONE (1) TABLET BY MOUTH ONCE DAILY  Dispense: 90 tablet; Refill: 1  3. Other specified hypothyroidism  - levothyroxine (SYNTHROID) 112 MCG tablet; TAKE (1) TABLET BY MOUTH EVERY DAY BEFORE BREAKFAST  Dispense: 90 tablet; Refill: 1 - TSH  4. Chronic neck pain  - gabapentin (NEURONTIN) 300 MG capsule; TAKE (1) CAPSULE BY MOUTH TWICE DAILY  Dispense: 120 capsule; Refill: 1  5. Essential hypertension, malignant  - CBC with Differential/Platelet - COMPLETE METABOLIC PANEL WITH GFR  6. Stage 3b chronic kidney disease (HCC)  - COMPLETE METABOLIC PANEL WITH GFR  7. COPD with acute exacerbation ([place])   8. Screening for diabetes mellitus  - Hemoglobin A1c  9. Screening for cholesterol level  - Lipid panel  10. Screening for colon cancer  - Ambulatory referral to Gastroenterology   Follow up plan:  Follow-up in one month

## 2021-01-31 NOTE — Telephone Encounter (Signed)
Copied from [place] [id number]. Topic: Quick Communication - Rx Refill/Question >> Jan 31, 2021 11:49 AM Tessa Lerner A wrote: Medication: gabapentin (NEURONTIN) 300 MG capsule [[id number]]   levothyroxine (SYNTHROID) 112 MCG tablet [[id number]]   PARoxetine (PAXIL) 40 MG tablet [[id number]]  Has the patient contacted their pharmacy? Yes.  The patient has had issues coordinating their prescriptions because of phone issues  (Agent: If no, request that the patient contact the pharmacy for the refill.) (Agent: If yes, when and what did the pharmacy advise?)  Preferred Pharmacy (with phone number or street name): [place], [place]  Phone:  [phone] Fax:  [phone]   Has the patient been seen for an appointment in the last year OR does the patient have an upcoming appointment? Yes.    Agent: Please be advised that RX refills may take up to 3 business days. We ask that you follow-up with your pharmacy.

## 2021-02-01 ENCOUNTER — Ambulatory Visit: Payer: Medicaid Other | Admitting: Unknown Physician Specialty

## 2021-02-01 LAB — CBC WITH DIFFERENTIAL/PLATELET
Absolute Monocytes: 534 cells/uL (ref 200–950)
Basophils Absolute: 53 cells/uL (ref 0–200)
Basophils Relative: 0.6 %
Eosinophils Absolute: 116 cells/uL (ref 15–500)
Eosinophils Relative: 1.3 %
HCT: 49.1 % — ABNORMAL HIGH (ref 35.0–45.0)
Hemoglobin: 16.6 g/dL — ABNORMAL HIGH (ref 11.7–15.5)
Lymphs Abs: 2020 cells/uL (ref 850–3900)
MCH: 30.5 pg (ref 27.0–33.0)
MCHC: 33.8 g/dL (ref 32.0–36.0)
MCV: 90.1 fL (ref 80.0–100.0)
MPV: 11.1 fL (ref 7.5–12.5)
Monocytes Relative: 6 %
Neutro Abs: 6177 cells/uL (ref 1500–7800)
Neutrophils Relative %: 69.4 %
Platelets: 263 10*3/uL (ref 140–400)
RBC: 5.45 10*6/uL — ABNORMAL HIGH (ref 3.80–5.10)
RDW: 14.4 % (ref 11.0–15.0)
Total Lymphocyte: 22.7 %
WBC: 8.9 10*3/uL (ref 3.8–10.8)

## 2021-02-01 LAB — LIPID PANEL
Cholesterol: 204 mg/dL — ABNORMAL HIGH (ref <200)
HDL: 66 mg/dL (ref >=50)
LDL Cholesterol (Calc): 97 mg/dL (calc)
Non-HDL Cholesterol (Calc): 138 mg/dL (calc) — ABNORMAL HIGH (ref <130)
Total CHOL/HDL Ratio: 3.1 (calc) (ref <5.0)
Triglycerides: 288 mg/dL — ABNORMAL HIGH (ref <150)

## 2021-02-01 LAB — HEMOGLOBIN A1C
Hgb A1c MFr Bld: 5.5 % of total Hgb (ref <5.7)
Mean Plasma Glucose: 111 mg/dL
eAG (mmol/L): 6.2 mmol/L

## 2021-02-01 LAB — COMPLETE METABOLIC PANEL WITH GFR
AG Ratio: 1.7 (calc) (ref 1.0–2.5)
ALT: 23 U/L (ref 6–29)
AST: 20 U/L (ref 10–35)
Albumin: 4.4 g/dL (ref 3.6–5.1)
Alkaline phosphatase (APISO): 91 U/L (ref 37–153)
BUN/Creatinine Ratio: 13 (calc) (ref 6–22)
BUN: 20 mg/dL (ref 7–25)
CO2: 26 mmol/L (ref 20–32)
Calcium: 9.5 mg/dL (ref 8.6–10.4)
Chloride: 106 mmol/L (ref 98–110)
Creat: 1.58 mg/dL — ABNORMAL HIGH (ref 0.50–1.03)
Globulin: 2.6 g/dL (calc) (ref 1.9–3.7)
Glucose, Bld: 84 mg/dL (ref 65–99)
Potassium: 3.8 mmol/L (ref 3.5–5.3)
Sodium: 144 mmol/L (ref 135–146)
Total Bilirubin: 0.8 mg/dL (ref 0.2–1.2)
Total Protein: 7 g/dL (ref 6.1–8.1)
eGFR: 39 mL/min/{1.73_m2} — ABNORMAL LOW (ref >=60)

## 2021-02-01 LAB — TSH: TSH: 27.39 mIU/L — ABNORMAL HIGH

## 2021-02-01 NOTE — Telephone Encounter (Signed)
Paxil, Gabapentin and Levothyroxine last ordered 01/31/21. Verified with [facility]-received all three orders. This is a duplicate and not needed-refusing at this time.

## 2021-02-01 NOTE — Telephone Encounter (Signed)
Attempted to call patient to see if she was able to obtain Rx- no answer. Pharmacy is not open yet to verify receipt of Rx.

## 2021-02-02 ENCOUNTER — Encounter: Payer: Self-pay | Admitting: Emergency Medicine

## 2021-02-13 ENCOUNTER — Other Ambulatory Visit: Payer: Self-pay | Admitting: Cardiovascular Disease

## 2021-02-13 DIAGNOSIS — Z419 Encounter for procedure for purposes other than remedying health state, unspecified: Secondary | ICD-10-CM | POA: Diagnosis not present

## 2021-02-13 DIAGNOSIS — I5022 Chronic systolic (congestive) heart failure: Secondary | ICD-10-CM

## 2021-02-13 NOTE — Telephone Encounter (Signed)
Patient will need an appt for further refills.  LS 12/2019. Was told to follow up in 4 months.   Thanks!

## 2021-02-16 ENCOUNTER — Other Ambulatory Visit: Payer: Self-pay

## 2021-02-16 ENCOUNTER — Encounter: Payer: Self-pay | Admitting: Cardiovascular Disease

## 2021-02-16 ENCOUNTER — Ambulatory Visit (INDEPENDENT_AMBULATORY_CARE_PROVIDER_SITE_OTHER): Payer: Medicaid Other | Admitting: Cardiovascular Disease

## 2021-02-16 VITALS — BP 178/120 | HR 76 | Ht 66.0 in | Wt 193.1 lb

## 2021-02-16 DIAGNOSIS — I2510 Atherosclerotic heart disease of native coronary artery without angina pectoris: Secondary | ICD-10-CM

## 2021-02-16 DIAGNOSIS — I10 Essential (primary) hypertension: Secondary | ICD-10-CM | POA: Diagnosis not present

## 2021-02-16 DIAGNOSIS — I5022 Chronic systolic (congestive) heart failure: Secondary | ICD-10-CM

## 2021-02-16 DIAGNOSIS — E782 Mixed hyperlipidemia: Secondary | ICD-10-CM

## 2021-02-16 DIAGNOSIS — I701 Atherosclerosis of renal artery: Secondary | ICD-10-CM

## 2021-02-16 MED ORDER — CARVEDILOL 12.5 MG PO TABS: 12.5000 mg | ORAL_TABLET | Freq: Two times a day (BID) | ORAL | 2 refills | Status: DC

## 2021-02-16 MED ORDER — ENTRESTO 24-26 MG PO TABS: 1.0000 | ORAL_TABLET | Freq: Two times a day (BID) | ORAL | 2 refills | Status: DC

## 2021-02-16 MED ORDER — FUROSEMIDE 20 MG PO TABS: ORAL_TABLET | ORAL | 2 refills | Status: DC

## 2021-02-16 NOTE — Progress Notes (Signed)
Cardiology Office Note   Date:  02/16/2021   ID:  Angelica Herrera, DOB 31-Jul-1970, MRN [id number]  PCP:  Delsa Grana, PA-C  Cardiologist:   Kathlyn Sacramento, MD  Nephrologist: Dr. Rolly Salter  Chief Complaint  Patient presents with   Other    Pt never had Renal U/S transportation issues/ pt needing refill.No complaints today. Meds reviewed verbally with pt.       History of Present Illness: Angelica Herrera is a 50 y.o. female who For a follow-up visit regarding coronary artery disease and chronic systolic heart failure.  She presented in October 2013 with acute anterolateral ST elevation myocardial infarction. She underwent emergent cardiac catheterization  which showed flush occlusion of left main coronary artery. There was significant difficulty in engaging the left main. Left main balloon angioplasty followed by drug-eluting stent placement was performed. She also had balloon angioplasty done to the LAD due to embolization from the left main. The patient had significant hemodynamic instability with ventricular tachycardia and hypotension.   She is a smoker. She had atypical chest pain in 2017. A pharmacologic nuclear stress test  showed prior anterior infarct with mild peri-infarct ischemia and  ejection fraction of 33%.  Cardiac catheterization at that time showed patent left main stent with moderate mid LAD stenosis and occluded second diagonal. Ejection fraction was 35-40% with anterior wall hypokinesis. She has history of renal artery stenosis status post left renal artery stenting in August of 2020 by Dr. Delana Meyer..  In addition, she has chronic kidney disease, hyperlipidemia and tobacco use. Most recent echocardiogram in November of 2020 showed an EF of 35 to 40% with moderate pulmonary hypertension. During last visit, she was switched from hydralazine to [facility].  She ran out of carvedilol, Entresto and furosemide recently.  She reports that her blood pressure is well  controlled when she takes the medications regularly.  She did notice worsening shortness of breath but no chest pain. She continues to struggle with depression and continues to smoke one fourth of a pack per day.  She also gained significant weight over the last 6 months.     Past Medical History:  Diagnosis Date   Arthritis    gout   CAD (coronary artery disease)    a. 02/2012 s/p anterolateral STEMI/Cath/PCI: LM 100 (3.0x15 DES ), LAD sm, severely diseased (PTCA w/ 1.32mm balloon), LCX nl, RI moderate size, nl, RCA 30-23m, AM/PDA/PLA nl, R->L collats noted; b. cath 08/03/15: widely patent ostLM stent at 10%, mLAD 60%,  mRCA 30%, EF 35-40%, ant wall HK, mildly elevated LVEDP; c. 02/2017 MV: ant infarct, mild peri-infarct isch (similar to 2017 study)->Med Rx.   Chronic combined systolic (congestive) and diastolic (congestive) heart failure ([place])    a. 02/2017 Echo: EF 30-35%, diff HK, sev HK/poss AK of ant and lat walls. Gr1 DD. Mild to mod MR. Nl RV fxn. Mod TR.   CKD (chronic kidney disease), stage IV (HCC)    HTN (hypertension)    Hyperlipidemia    Hypothyroidism    Ischemic cardiomyopathy    a. 02/2012 Echo: EF 40-45%, ant/lat HK, Gr1 DD, Mild MR, PASP 101mmHg; b. 02/2017 Echo: EF 30-35%.   NSVT (nonsustained ventricular tachycardia)    a. 02/2012 post-MI   Obesity    Pancreatitis    S/P primary angioplasty with coronary stent 03/04/2018   Dual antiplatelet therapy INDEFINITELY per cardiology note Nov 2019   Tobacco abuse     Past Surgical History:  Procedure Laterality Date  ABDOMINAL HYSTERECTOMY     CARDIAC CATHETERIZATION  2013   [facility] s/p stent   CARDIAC CATHETERIZATION Left 08/03/2015   Procedure: Left Heart Cath and Coronary Angiography;  Surgeon: Wellington Hampshire, MD;  Location: [place] [facility];  Service: Cardiovascular;  Laterality: Left;   DILATION AND CURETTAGE OF UTERUS     LEFT HEART CATHETERIZATION WITH CORONARY ANGIOGRAM N/A 02/11/2012   Procedure: LEFT HEART  CATHETERIZATION WITH CORONARY ANGIOGRAM;  Surgeon: [name], MD;  Location: [facility] C[facility];  Service: Cardiovascular;  Laterality: N/A;   RENAL INTERVENTION N/A 11/17/2018   Procedure: RENAL INTERVENTION;  Surgeon: Katha Cabal, MD;  Location: [place] [facility];  Service: Cardiovascular;  Laterality: N/A;     Current Outpatient Medications  Medication Sig Dispense Refill   aspirin 81 MG tablet Take 1 tablet (81 mg total) by mouth daily. 30 tablet    atorvastatin (LIPITOR) 80 MG tablet Take 1 tablet (80 mg total) by mouth at bedtime. 90 tablet 3   carvedilol (COREG) 12.5 MG tablet Take 1 tablet (12.5 mg total) by mouth 2 (two) times daily with a meal. 180 tablet 1   cetirizine (ZYRTEC) 10 MG tablet TAKE (1) TABLET BY MOUTH DAILY AT BEDTIME 30 tablet 11   clopidogrel (PLAVIX) 75 MG tablet TAKE (1) TABLET BY MOUTH EVERY DAY 90 tablet 0   furosemide (LASIX) 20 MG tablet Take 1 tablet (20 mg total) by mouth daily. Pt needs to make appt with provider before further refills - 1st attempt 90 tablet 0   gabapentin (NEURONTIN) 300 MG capsule TAKE (1) CAPSULE BY MOUTH TWICE DAILY 120 capsule 1   levothyroxine (SYNTHROID) 112 MCG tablet TAKE (1) TABLET BY MOUTH EVERY DAY BEFORE BREAKFAST 90 tablet 1   nitroGLYCERIN (NITROSTAT) 0.4 MG SL tablet Place 1 tablet (0.4 mg total) under the tongue every 5 (five) minutes x 3 doses as needed for chest pain. 25 tablet 3   PARoxetine (PAXIL) 40 MG tablet TAKE ONE (1) TABLET BY MOUTH ONCE DAILY 90 tablet 1   sacubitril-valsartan (ENTRESTO) 24-26 MG Take 1 tablet by mouth 2 (two) times daily. 60 tablet 5   Current Facility-Administered Medications  Medication Dose Route Frequency Provider Last Rate Last Admin   mometasone-formoterol (DULERA) 200-5 MCG/ACT inhaler 2 puff  2 puff Inhalation BID Delsa Grana, PA-C        Allergies:   Patient has no known allergies.    Social History:  The patient  reports that she has been smoking cigarettes. She has a 7.50  pack-year smoking history. She has never used smokeless tobacco. She reports that she does not currently use alcohol. She reports that she does not use drugs.   Family History:  The patient's family history includes AAA (abdominal aortic aneurysm) in her father; Cancer in her paternal grandfather; Diabetes in her mother; Hyperlipidemia in her mother; Hypertension in her brother, brother, daughter, father, mother, sister, and sister; Kidney disease in her mother; Supraventricular tachycardia in her sister; Thyroid disease in her father.    ROS:  Please see the history of present illness.   Otherwise, review of systems are positive for none.   All other systems are reviewed and negative.    PHYSICAL EXAM: VS:  BP (!) 178/120 (BP Location: Left Arm, Patient Position: Sitting, Cuff Size: Normal)   Pulse 76   Ht 5\' 6"  (1.676 m)   Wt 193 lb 2 oz (87.6 kg)   SpO2 98%   BMI 31.17 kg/m  ,  BMI Body mass index is 31.17 kg/m. GEN: Well nourished, well developed, in no acute distress  HEENT: normal  Neck: no JVD, carotid bruits, or masses Cardiac: RRR; no murmurs, rubs, or gallops,no edema  Respiratory:  clear to auscultation bilaterally, normal work of breathing GI: soft, nontender, nondistended, + BS MS: no deformity or atrophy  Skin: warm and dry, no rash Neuro:  Strength and sensation are intact Psych: euthymic mood, full affect   EKG:  EKG is ordered today. The ekg ordered today demonstrates sinus rhythm with left anterior fascicular block, minimal LVH with repolarization abnormalities.   Recent Labs: 01/31/2021: ALT 23; BUN 20; Creat 1.58; Hemoglobin 16.6; Platelets 263; Potassium 3.8; Sodium 144; TSH 27.39    Lipid Panel    Component Value Date/Time   CHOL 204 (H) 01/31/2021 1428   CHOL 136 02/27/2018 1207   TRIG 288 (H) 01/31/2021 1428   HDL 66 01/31/2021 1428   HDL 53 02/27/2018 1207   CHOLHDL 3.1 01/31/2021 1428   VLDL 15 11/13/2018 0628   LDLCALC 97 01/31/2021 1428       Wt Readings from Last 3 Encounters:  02/16/21 193 lb 2 oz (87.6 kg)  01/31/21 189 lb 12.8 oz (86.1 kg)  07/17/20 160 lb (72.6 kg)         ASSESSMENT AND PLAN:  1.  Coronary artery disease involving native coronary arteries with other forms of angina: She is doing reasonably well overall with stable symptoms.  Continue medical therapy.  She is on long-term dual antiplatelet therapy with aspirin and clopidogrel given previous left main stent.  2. Chronic systolic heart failure: Due to ischemic cardiomyopathy with most recent ejection fraction 35-40%.  Unfortunately, she ran out of carvedilol, Entresto and furosemide recently.  These were refilled today and discussed with her the importance of taking medications regularly.  She reports that her blood pressure is well controlled when she takes the medications.  Thus, I made no adjustment today.  3. Essential hypertension: Blood pressure is elevated but she has been out of carvedilol and Entresto.  Resume both and reevaluate blood pressure upon follow-up.  4. Hyperlipidemia: Her LDL used to be below 70 on high-dose atorvastatin.  However, most recent lipid profile showed an LDL of 97.  This is likely due to her weight gain and unhealthy eating habits.  Discussed with her the importance of healthy lifestyle changes including diet and regular exercise.  She is thinking about getting a treadmill for exercise.  We will reevaluate upon follow-up and if LDL remains above 70, will consider switching to high-dose rosuvastatin and adding Zetia.  5. Tobacco use: I discussed with her the importance of cessation. She reports being under significant stress.  6. Chronic kidney disease: Stable.  7.  Status post left renal artery stent: She could not make her appointment for renal artery duplex.  She reports issues with transportation at this time.    Disposition:   FU with me in 6 months  Signed,  Kathlyn Sacramento, MD  02/16/2021 3:24 PM    [place]

## 2021-02-16 NOTE — Patient Instructions (Signed)
Medication Instructions:  - Your physician recommends that you continue on your current medications as directed. Please refer to the Current Medication list given to you today.  *If you need a refill on your cardiac medications before your next appointment, please call your pharmacy*   Lab Work: - none ordered  If you have labs (blood work) drawn today and your tests are completely normal, you will receive your results only by: [place] (if you have MyChart) OR A paper copy in the mail If you have any lab test that is abnormal or we need to change your treatment, we will call you to review the results.   Testing/Procedures: - none ordered   Follow-Up: At [facility], you and your health needs are our priority.  As part of our continuing mission to provide you with exceptional heart care, we have created designated Provider Care Teams.  These Care Teams include your primary Cardiologist (physician) and Advanced Practice Providers (APPs -  Physician Assistants and Nurse Practitioners) who all work together to provide you with the care you need, when you need it.  We recommend signing up for the patient portal called "MyChart".  Sign up information is provided on this After Visit Summary.  MyChart is used to connect with patients for Virtual Visits (Telemedicine).  Patients are able to view lab/test results, encounter notes, upcoming appointments, etc.  Non-urgent messages can be sent to your provider as well.   To learn more about what you can do with MyChart, go to [email or web address].    Your next appointment:   6 month(s)  The format for your next appointment:   In Person  Provider:   You may see Kathlyn Sacramento, MD or one of the following Advanced Practice Providers on your designated Care Team:   Murray Hodgkins, NP [name], PA-C Cadence Kathlen Mody, [place]    Other Instructions N/a

## 2021-02-19 ENCOUNTER — Other Ambulatory Visit: Payer: Self-pay

## 2021-02-19 DIAGNOSIS — Z800 Family history of malignant neoplasm of digestive organs: Secondary | ICD-10-CM

## 2021-02-19 DIAGNOSIS — Z1211 Encounter for screening for malignant neoplasm of colon: Secondary | ICD-10-CM

## 2021-02-19 MED ORDER — NA SULFATE-K SULFATE-MG SULF 17.5-3.13-1.6 GM/177ML PO SOLN: 1.0000 | Freq: Once | ORAL | 0 refills | Status: AC

## 2021-02-19 NOTE — Progress Notes (Signed)
Gastroenterology Pre-Procedure Review  Request Date: 03/05/21 Requesting Physician: Dr. Allen Norris  PATIENT REVIEW QUESTIONS: The patient responded to the following health history questions as indicated:    1. Are you having any GI issues?  Some Gas lower abdomen. 2. Do you have a personal history of Polyps? no 3. Do you have a family history of Colon Cancer or Polyps? yes (Father- colon cancer) 4. Diabetes Mellitus? no 5. Joint replacements in the past 12 months?no 6. Major health problems in the past 3 months?no 7. Any artificial heart valves, MVP, or defibrillator?yes (coronary Stents)    MEDICATIONS & ALLERGIES:    Patient reports the following regarding taking any anticoagulation/antiplatelet therapy:   Plavix, Coumadin, Eliquis, Xarelto, Lovenox, Pradaxa, Brilinta, or Effient? yes (Plavix 75 mg) Aspirin? yes (81 mg)  Patient confirms/reports the following medications:  Current Outpatient Medications  Medication Sig Dispense Refill   aspirin 81 MG tablet Take 1 tablet (81 mg total) by mouth daily. 30 tablet    atorvastatin (LIPITOR) 80 MG tablet Take 1 tablet (80 mg total) by mouth at bedtime. 90 tablet 3   carvedilol (COREG) 12.5 MG tablet Take 1 tablet (12.5 mg total) by mouth 2 (two) times daily with a meal. 180 tablet 2   cetirizine (ZYRTEC) 10 MG tablet TAKE (1) TABLET BY MOUTH DAILY AT BEDTIME 30 tablet 11   clopidogrel (PLAVIX) 75 MG tablet TAKE (1) TABLET BY MOUTH EVERY DAY 90 tablet 0   furosemide (LASIX) 20 MG tablet Take 1 tablet (20 mg) by mouth once daily 90 tablet 2   gabapentin (NEURONTIN) 300 MG capsule TAKE (1) CAPSULE BY MOUTH TWICE DAILY 120 capsule 1   levothyroxine (SYNTHROID) 112 MCG tablet TAKE (1) TABLET BY MOUTH EVERY DAY BEFORE BREAKFAST 90 tablet 1   nitroGLYCERIN (NITROSTAT) 0.4 MG SL tablet Place 1 tablet (0.4 mg total) under the tongue every 5 (five) minutes x 3 doses as needed for chest pain. 25 tablet 3   PARoxetine (PAXIL) 40 MG tablet TAKE ONE (1)  TABLET BY MOUTH ONCE DAILY 90 tablet 1   sacubitril-valsartan (ENTRESTO) 24-26 MG Take 1 tablet by mouth 2 (two) times daily. 180 tablet 2   Current Facility-Administered Medications  Medication Dose Route Frequency Provider Last Rate Last Admin   mometasone-formoterol (DULERA) 200-5 MCG/ACT inhaler 2 puff  2 puff Inhalation BID Delsa Grana, PA-C        Patient confirms/reports the following allergies:  No Known Allergies  No orders of the defined types were placed in this encounter.   AUTHORIZATION INFORMATION Primary Insurance: 1D#: Group #:  Secondary Insurance: 1D#: Group #:  SCHEDULE INFORMATION: Date: 03/05/21 Time: Location: [place]

## 2021-02-20 ENCOUNTER — Telehealth: Payer: Self-pay | Admitting: Cardiovascular Disease

## 2021-02-20 ENCOUNTER — Other Ambulatory Visit: Payer: Self-pay

## 2021-02-20 ENCOUNTER — Encounter: Payer: Self-pay | Admitting: Gastroenterology

## 2021-02-20 NOTE — Telephone Encounter (Signed)
   [facility] Pre-operative Risk Assessment    Patient Name: SHEREECE WELLBORN  DOB: 1970-06-08 MRN: [id number]  HEARTCARE STAFF:  - IMPORTANT!!!!!! Under Visit Info/Reason for Call, type in Other and utilize the format Clearance MM/DD/YY or Clearance TBD. Do not use dashes or single digits. - Please review there is not already an duplicate clearance open for this procedure. - If request is for dental extraction, please clarify the # of teeth to be extracted. - If the patient is currently at the dentist's office, call Pre-Op Callback Staff (MA/nurse) to input urgent request.  - If the patient is not currently in the dentist office, please route to the Pre-Op pool.  Request for surgical clearance:  What type of surgery is being performed? Colonoscopy   When is this surgery scheduled? 03/05/21  What type of clearance is required (medical clearance vs. Pharmacy clearance to hold med vs. Both)? both  Are there any medications that need to be held prior to surgery and how long? Not listed, please advise if needed  Practice name and name of physician performing surgery? [facility]   What is the office phone number? [phone]   7.   What is the office fax number? [phone]  8.   Anesthesia type (None, local, MAC, general) ? General    Caryl Pina Gerringer 02/20/2021, 11:18 AM  _________________________________________________________________   (provider comments below)

## 2021-02-21 ENCOUNTER — Other Ambulatory Visit: Payer: Self-pay

## 2021-02-21 ENCOUNTER — Encounter: Payer: Self-pay | Admitting: Anesthesiology

## 2021-02-21 NOTE — Telephone Encounter (Signed)
Hi Dr. Fletcher Anon. Patient has a colonoscopy scheduled for later this month on 03/05/2021. She has a history of CAD, chronic systolic CHF, and renal artery stenosis s/p stenting in 2020. You recently saw her on 02/16/2021 at which time she had run out of her CHF medications and reported some mild worsening shortness of breath but no chest pain. Overall, sounds like she was stable from a cardiac standpoint. Based off this, I think she should be OK for a low risk colonoscopy, but since you just saw her a few days ago, I wanted to make sure you agree? Also, can you please comment on how long patient can hold Plavix for this?  Thank you! Saul Dorsi

## 2021-02-21 NOTE — Progress Notes (Signed)
Patient had to be schedule at [facility] [address] due to cardiology history. Pt verbalized understanding.

## 2021-02-23 ENCOUNTER — Telehealth: Payer: Self-pay

## 2021-02-23 NOTE — Telephone Encounter (Signed)
Patient has been informed of blood thinner clearance. Per Dr. Rogue Jury patient may hold Plavix (5) days prior to procedure and restart after. Pt verbalized understanding.

## 2021-02-23 NOTE — Telephone Encounter (Signed)
She is at low risk for colonoscopy.  Can hold Plavix 5 days before the procedure and resume after.

## 2021-02-28 ENCOUNTER — Ambulatory Visit (INDEPENDENT_AMBULATORY_CARE_PROVIDER_SITE_OTHER): Payer: Medicaid Other | Admitting: Nurse Practitioner

## 2021-02-28 ENCOUNTER — Encounter: Payer: Self-pay | Admitting: Nurse Practitioner

## 2021-02-28 ENCOUNTER — Other Ambulatory Visit: Payer: Self-pay

## 2021-02-28 VITALS — BP 106/64 | HR 70 | Temp 97.6°F | Resp 16 | Ht 66.0 in | Wt 193.4 lb

## 2021-02-28 DIAGNOSIS — F3341 Major depressive disorder, recurrent, in partial remission: Secondary | ICD-10-CM | POA: Diagnosis not present

## 2021-02-28 DIAGNOSIS — F411 Generalized anxiety disorder: Secondary | ICD-10-CM | POA: Diagnosis not present

## 2021-02-28 MED ORDER — BUSPIRONE HCL 5 MG PO TABS: 5.0000 mg | ORAL_TABLET | Freq: Two times a day (BID) | ORAL | 0 refills | Status: DC

## 2021-02-28 NOTE — Progress Notes (Signed)
BP 106/64   Pulse 70   Temp 97.6 F (36.4 C) (Oral)   Resp 16   Ht 5' 6"  (1.676 m)   Wt 193 lb 6.4 oz (87.7 kg)   SpO2 96%   BMI 31.22 kg/m    Subjective:    Patient ID: Angelica Herrera, female    DOB: November 10, 1970, 50 y.o.   MRN: [id number]  HPI: Angelica Herrera is a 50 y.o. female, here alone  Chief Complaint  Patient presents with   Follow-up   Depression   Depression/Anxiety:  She says she can tell that her mood is getting better.  She says that she still has thoughts that something bad is going to happen and she feels like her anxiety has not changed that much.  She denies any suicidal thoughts.  She is currently taking Paxil 40 mg daily.  She says she has been taking her medication every day.  Her PHQ9 is 11 today.  This is an improvement from 21 at her last visit.  Her GAD score is 15 today and was 18 at last visit.  Will add Buspar PRN for her anxiety.    Depression screen [facility] 2/9 02/28/2021 01/31/2021 03/17/2020 02/18/2020 09/02/2019  Decreased Interest 2 3 1 1  0  Down, Depressed, Hopeless 2 3 2 2  0  PHQ - 2 Score 4 6 3 3  0  Altered sleeping 1 3 3 3  0  Tired, decreased energy 3 3 3 3  0  Change in appetite 0 3 1 3  0  Feeling bad or failure about yourself  0 3 0 2 0  Trouble concentrating 3 3 3 3  0  Moving slowly or fidgety/restless 0 0 3 1 0  Suicidal thoughts 0 0 0 0 0  PHQ-9 Score 11 21 16 18  0  Difficult doing work/chores Somewhat difficult Very difficult Somewhat difficult Very difficult Not difficult at all  Some recent data might be hidden   GAD 7 : Generalized Anxiety Score 02/28/2021 01/31/2021 03/17/2020 02/18/2020  Nervous, Anxious, on Edge 3 3 3 2   Control/stop worrying 3 3 3 3   Worry too much - different things 3 3 3 3   Trouble relaxing 0 3 3 3   Restless 0 0 3 2  Easily annoyed or irritable 3 3 3 3   Afraid - awful might happen 3 3 3 3   Total GAD 7 Score 15 18 21 19   Anxiety Difficulty - Very difficult - Somewhat difficult     Relevant past  medical, surgical, family and social history reviewed and updated as indicated. Interim medical history since our last visit reviewed. Allergies and medications reviewed and updated.  Review of Systems  Constitutional: Negative for fever or weight change.  Respiratory: Negative for cough and shortness of breath.   Cardiovascular: Negative for chest pain or palpitations.  Gastrointestinal: Negative for abdominal pain, no bowel changes.  Musculoskeletal: Negative for gait problem or joint swelling.  Skin: Negative for rash.  Neurological: Negative for dizziness or headache.  No other specific complaints in a complete review of systems (except as listed in HPI above).    Objective:    BP 106/64   Pulse 70   Temp 97.6 F (36.4 C) (Oral)   Resp 16   Ht 5' 6"  (1.676 m)   Wt 193 lb 6.4 oz (87.7 kg)   SpO2 96%   BMI 31.22 kg/m   Wt Readings from Last 3 Encounters:  02/28/21 193 lb 6.4 oz (87.7 kg)  02/16/21 193  lb 2 oz (87.6 kg)  01/31/21 189 lb 12.8 oz (86.1 kg)    Physical Exam  Constitutional: Patient appears well-developed and well-nourished. Overweight No distress.  HEENT: head atraumatic, normocephalic, pupils equal and reactive to light, neck supple, throat within normal limits Cardiovascular: Normal rate, regular rhythm and normal heart sounds.  No murmur heard. No BLE edema. Pulmonary/Chest: Effort normal and breath sounds normal. No respiratory distress. Abdominal: Soft.  There is no tenderness. Psychiatric: Patient has a normal mood and affect. behavior is normal. Judgment and thought content normal.   Results for orders placed or performed in visit on 01/31/21  CBC with Differential/Platelet  Result Value Ref Range   WBC 8.9 3.8 - 10.8 Thousand/uL   RBC 5.45 (H) 3.80 - 5.10 Million/uL   Hemoglobin 16.6 (H) 11.7 - 15.5 g/dL   HCT 49.1 (H) 35.0 - 45.0 %   MCV 90.1 80.0 - 100.0 fL   MCH 30.5 27.0 - 33.0 pg   MCHC 33.8 32.0 - 36.0 g/dL   RDW 14.4 11.0 - 15.0 %    Platelets 263 140 - 400 Thousand/uL   MPV 11.1 7.5 - 12.5 fL   Neutro Abs 6,177 1,500 - 7,800 cells/uL   Lymphs Abs 2,020 850 - 3,900 cells/uL   Absolute Monocytes 534 200 - 950 cells/uL   Eosinophils Absolute 116 15 - 500 cells/uL   Basophils Absolute 53 0 - 200 cells/uL   Neutrophils Relative % 69.4 %   Total Lymphocyte 22.7 %   Monocytes Relative 6.0 %   Eosinophils Relative 1.3 %   Basophils Relative 0.6 %  COMPLETE METABOLIC PANEL WITH GFR  Result Value Ref Range   Glucose, Bld 84 65 - 99 mg/dL   BUN 20 7 - 25 mg/dL   Creat 1.58 (H) 0.50 - 1.03 mg/dL   eGFR 39 (L) > OR = 60 mL/min/1.64m   BUN/Creatinine Ratio 13 6 - 22 (calc)   Sodium 144 135 - 146 mmol/L   Potassium 3.8 3.5 - 5.3 mmol/L   Chloride 106 98 - 110 mmol/L   CO2 26 20 - 32 mmol/L   Calcium 9.5 8.6 - 10.4 mg/dL   Total Protein 7.0 6.1 - 8.1 g/dL   Albumin 4.4 3.6 - 5.1 g/dL   Globulin 2.6 1.9 - 3.7 g/dL (calc)   AG Ratio 1.7 1.0 - 2.5 (calc)   Total Bilirubin 0.8 0.2 - 1.2 mg/dL   Alkaline phosphatase (APISO) 91 37 - 153 U/L   AST 20 10 - 35 U/L   ALT 23 6 - 29 U/L  Lipid panel  Result Value Ref Range   Cholesterol 204 (H) <200 mg/dL   HDL 66 > OR = 50 mg/dL   Triglycerides 288 (H) <150 mg/dL   LDL Cholesterol (Calc) 97 mg/dL (calc)   Total CHOL/HDL Ratio 3.1 <5.0 (calc)   Non-HDL Cholesterol (Calc) 138 (H) <130 mg/dL (calc)  Hemoglobin A1c  Result Value Ref Range   Hgb A1c MFr Bld 5.5 <5.7 % of total Hgb   Mean Plasma Glucose 111 mg/dL   eAG (mmol/L) 6.2 mmol/L  TSH  Result Value Ref Range   TSH 27.39 (H) mIU/L      Assessment & Plan:   1. Recurrent major depressive disorder ([place] -continue taking Paxil 40 mg daily - busPIRone (BUSPAR) 5 MG tablet; Take 1 tablet (5 mg total) by mouth 2 (two) times daily.  Dispense: 30 tablet; Refill: 0  2. GAD (generalized anxiety disorder) -continue taking Paxil 40  mg daily - busPIRone (BUSPAR) 5 MG tablet; Take 1 tablet (5 mg total) by mouth 2 (two) times  daily.  Dispense: 30 tablet; Refill: 0   Follow up plan: Return in about 4 weeks (around 03/28/2021) for follow up.

## 2021-03-05 ENCOUNTER — Ambulatory Visit: Admission: RE | Admit: 2021-03-05 | Payer: Medicaid Other | Source: Ambulatory Visit | Admitting: Gastroenterology

## 2021-03-05 ENCOUNTER — Telehealth: Payer: Self-pay | Admitting: Gastroenterology

## 2021-03-05 SURGERY — COLONOSCOPY WITH PROPOFOL
Anesthesia: Choice

## 2021-03-05 NOTE — Telephone Encounter (Signed)
Procedure was scheduled for 11/22. Order was put in for the date 11/21. Patient needs to cancel procedure. Pt states something came up and she will contact the office to reschedule. Endo unit has been notified.

## 2021-03-05 NOTE — Telephone Encounter (Signed)
Patient wants to cancel procedure. Clinical staff will follow up with patient.

## 2021-03-15 DIAGNOSIS — Z419 Encounter for procedure for purposes other than remedying health state, unspecified: Secondary | ICD-10-CM | POA: Diagnosis not present

## 2021-03-19 ENCOUNTER — Other Ambulatory Visit: Payer: Self-pay | Admitting: Nurse Practitioner

## 2021-03-19 ENCOUNTER — Other Ambulatory Visit: Payer: Self-pay | Admitting: Family Medicine

## 2021-03-19 ENCOUNTER — Other Ambulatory Visit: Payer: Self-pay | Admitting: Cardiovascular Disease

## 2021-03-19 DIAGNOSIS — I701 Atherosclerosis of renal artery: Secondary | ICD-10-CM

## 2021-03-19 DIAGNOSIS — I2510 Atherosclerotic heart disease of native coronary artery without angina pectoris: Secondary | ICD-10-CM

## 2021-03-19 DIAGNOSIS — E785 Hyperlipidemia, unspecified: Secondary | ICD-10-CM

## 2021-03-19 DIAGNOSIS — F3341 Major depressive disorder, recurrent, in partial remission: Secondary | ICD-10-CM

## 2021-03-19 DIAGNOSIS — Z9889 Other specified postprocedural states: Secondary | ICD-10-CM

## 2021-03-19 DIAGNOSIS — F411 Generalized anxiety disorder: Secondary | ICD-10-CM

## 2021-03-19 DIAGNOSIS — I739 Peripheral vascular disease, unspecified: Secondary | ICD-10-CM

## 2021-03-19 NOTE — Telephone Encounter (Signed)
Requested Prescriptions  Pending Prescriptions Disp Refills  . busPIRone (BUSPAR) 5 MG tablet [Pharmacy Med Name: BUSPIRONE HCL 5 MG TAB] 30 tablet 0    Sig: TAKE (1) TABLET BY MOUTH TWICE DAILY     Psychiatry: Anxiolytics/Hypnotics - Non-controlled Passed - 03/19/2021  9:48 AM      Passed - Valid encounter within last 6 months    Recent Outpatient Visits          2 weeks ago Recurrent major depressive disorder [facility])   Jette, FNP   1 month ago Recurrent major depressive disorder [facility])   [facility] Bo Merino, FNP   1 year ago Abdominal pain, unspecified abdominal location   [facility] Delsa Grana, PA-C   1 year ago Right upper quadrant abdominal pain   [place], MD   1 year ago COPD with acute exacerbation [facility])   [facility] Delsa Grana, PA-C      Future Appointments            In 1 week Reece Packer, Myna Hidalgo, [facility], [facility]

## 2021-03-28 ENCOUNTER — Ambulatory Visit: Payer: Medicaid Other | Admitting: Family Medicine

## 2021-04-15 DIAGNOSIS — Z419 Encounter for procedure for purposes other than remedying health state, unspecified: Secondary | ICD-10-CM | POA: Diagnosis not present

## 2021-05-01 ENCOUNTER — Other Ambulatory Visit: Payer: Self-pay

## 2021-05-01 ENCOUNTER — Encounter: Payer: Self-pay | Admitting: Emergency Medicine

## 2021-05-01 ENCOUNTER — Emergency Department
Admission: EM | Admit: 2021-05-01 | Discharge: 2021-05-01 | Disposition: A | Discharge status: Home / Self Care | Payer: Medicaid Other | Attending: Emergency Medicine | Admitting: Emergency Medicine

## 2021-05-01 DIAGNOSIS — K047 Periapical abscess without sinus: Secondary | ICD-10-CM | POA: Insufficient documentation

## 2021-05-01 DIAGNOSIS — K0889 Other specified disorders of teeth and supporting structures: Secondary | ICD-10-CM | POA: Diagnosis present

## 2021-05-01 MED ORDER — HYDROCODONE-ACETAMINOPHEN 5-325 MG PO TABS
1.0000 | ORAL_TABLET | Freq: Four times a day (QID) | ORAL | 0 refills | Status: AC | PRN
Start: 2021-05-01 — End: 2021-05-04

## 2021-05-01 MED ORDER — AMOXICILLIN 500 MG PO TABS: 500.0000 mg | ORAL_TABLET | Freq: Three times a day (TID) | ORAL | 0 refills | Status: DC

## 2021-05-01 NOTE — ED Provider Notes (Signed)
[facility] Provider Note    Event Date/Time   First MD Initiated Contact with Patient 05/01/21 1215     (approximate)   History   Dental Pain   HPI  Angelica Herrera is a 51 y.o. female  presents to the ER for treatment and evaluation of dental pain and associated facial swelling. Symptoms started 2-3 days ago. No relief with viscous lidocaine or warm compresses.       Physical Exam   Triage Vital Signs: ED Triage Vitals [05/01/21 1155]  Enc Vitals Group     BP (!) 166/96     Pulse Rate 70     Resp 19     Temp 98.3 F (36.8 C)     Temp Source Oral     SpO2 97 %     Weight      Height      Head Circumference      Peak Flow      Pain Score 8     Pain Loc      Pain Edu?      Excl. in [place]?     Most recent vital signs: Vitals:   05/01/21 1155  BP: (!) 166/96  Pulse: 70  Resp: 19  Temp: 98.3 F (36.8 C)  SpO2: 97%   General: Awake, no distress.  CV:  Good peripheral perfusion.  Resp:  Normal effort.  Abd:  No distention.  Other:  Chronic appearing broken left molar left lower with associated facial swelling. Sublingual surface is soft.    ED Results / Procedures / Treatments   Labs (all labs ordered are listed, but only abnormal results are displayed) Labs Reviewed - No data to display   EKG  Not indicated.   RADIOLOGY Not indicated  PROCEDURES:  Critical Care performed: No  Procedures   MEDICATIONS ORDERED IN ED: Medications - No data to display   IMPRESSION / MDM / Many / ED COURSE  I reviewed the triage vital signs and the nursing notes.                              Differential diagnosis includes, but is not limited to, dental infection, dental abscess  51 year old female presenting to the emergency department for treatment and evaluation of dental pain and abscess.  See HPI for further details.  Plan will be to treat with amoxicillin and give a few days of Norco. She was encouraged  to call today to schedule a dentist appointment and a list of community resources was provided.  She was also encouraged to use warm salt water rinse several times per day.  She is to return to the emergency department for symptoms of change or worsen if she is unable to see  a dentist.      FINAL CLINICAL IMPRESSION(S) / ED DIAGNOSES   Final diagnoses:  Dental abscess     Rx / DC Orders   ED Discharge Orders          Ordered    amoxicillin (AMOXIL) 500 MG tablet  3 times daily        05/01/21 1225    HYDROcodone-acetaminophen (NORCO/VICODIN) 5-325 MG tablet  Every 6 hours PRN        05/01/21 1225             Note:  This document was prepared using Dragon voice recognition software and may include unintentional  dictation errors.   Victorino Dike, FNP 05/01/21 1231    Duffy Bruce, MD 05/03/21 1942

## 2021-05-01 NOTE — ED Triage Notes (Signed)
Pt presents to ED with recurrent dental abscess to L side of face. Pt denies fevers or chills. Pt has no dentist at this time. NAD noted.

## 2021-05-01 NOTE — Discharge Instructions (Signed)
Please call and schedule a dental appointment as soon as possible. You will need to be seen within the next 14 days. Return to the emergency department for symptoms that change or worsen if you're unable to schedule an appointment.  OPTIONS FOR DENTAL FOLLOW UP CARE  [facility] and [place] [email or web address] [phone])  [facility] [phone])  [place] [phone] ext [phone])  [place] [phone])  [facility] [phone]) This clinic caters to the indigent population and is on a lottery system. Location: [employer] [facility], [place], [place], [place] Clinic Hours: Wednesdays from 6pm - 9pm, patients seen by a lottery system. For dates, call or go to [email or web address] Services: Cleanings, fillings and simple extractions. Payment Options: DENTAL WORK IS FREE OF CHARGE. Bring proof of income or support. Best way to get seen: Arrive at 5:15 pm - this is a lottery, NOT first come/first serve, so arriving earlier will not increase your chances of being seen.     [facility] [phone] Select option 1 for emergencies   Location: [facility], [place], [address], [place] Clinic Hours: No walk-ins accepted - call the day before to schedule an appointment. Check in times are 9:30 am and 1:30 pm. Services: Simple extractions, temporary fillings, pulpectomy/pulp debridement, uncomplicated abscess drainage. Payment Options: PAYMENT IS DUE AT THE TIME OF SERVICE.  Fee is usually $100-200, additional surgical procedures (e.g. abscess drainage) may be extra. Cash, checks, Visa/MasterCard accepted.  Can file Medicaid if patient is covered for dental - patient should call case worker to check. No  discount for [facility] patients. Best way to get seen: MUST call the day before and get onto the schedule. Can usually be seen the next 1-2 days. No walk-ins accepted.     [place] [phone]   Location: [place], [place] Clinic Hours: M, W, Th, F 8am or 1:30pm, Tues 9a or 1:30 - first come/first served. Services: Simple extractions, temporary fillings, uncomplicated abscess drainage.  You do not need to be an [facility] resident. Payment Options: PAYMENT IS DUE AT THE TIME OF SERVICE. Dental insurance, otherwise sliding scale - bring proof of income or support. Depending on income and treatment needed, cost is usually $50-200. Best way to get seen: Arrive early as it is first come/first served.     [facility] [phone]   Location: [place] [facility] Hours: Mon-Thu 8a-5p Services: Most basic dental services including extractions and fillings. Payment Options: PAYMENT IS DUE AT THE TIME OF SERVICE. Sliding scale, up to 50% off - bring proof if income or support. Medicaid with dental option accepted. Best way to get seen: Call to schedule an appointment, can usually be seen within 2 weeks OR they will try to see walk-ins - show up at [place] or 2p (you may have to wait).     [facility] [place] RESIDENTS ONLY   Location: [facility], [place] [address] [place], [place] [address] Clinic Hours: By appointment only. Monday - Thursday 8am-5pm, Friday 8am-12pm Services: Cleanings, fillings, extractions. Payment Options: PAYMENT IS DUE AT THE TIME OF SERVICE. Cash, Visa or MasterCard. Sliding scale - $30 minimum per service. Best way to get seen: Come in to office, complete packet and make an appointment -  need proof of income or support monies for each household member and proof of [facility]  residence. Usually takes about a month to get in.     [facility] [phone]   Location: [address], [place] Clinic Hours: Walk-in Urgent Care Dental Services are offered Monday-Friday mornings only. The numbers of emergencies accepted daily is limited to the number of providers available. Maximum 15 - Mondays, Wednesdays & Thursdays Maximum 10 - Tuesdays & Fridays Services: You do not need to be a [facility] resident to be seen for a dental emergency. Emergencies are defined as pain, swelling, abnormal bleeding, or dental trauma. Walkins will receive x-rays if needed. NOTE: Dental cleaning is not an emergency. Payment Options: PAYMENT IS DUE AT THE TIME OF SERVICE. Minimum co-pay is $40.00 for uninsured patients. Minimum co-pay is $3.00 for Medicaid with dental coverage. Dental Insurance is accepted and must be presented at time of visit. Medicare does not cover dental. Forms of payment: Cash, credit card, checks. Best way to get seen: If not previously registered with the clinic, walk-in dental registration begins at 7:15 am and is on a first come/first serve basis. If previously registered with the clinic, call to make an appointment.     [facility] [place] ONLY   Location: [address] [place], [place] Clinic Hours: Mon-Thu 10a-2p Services: Extractions only! Payment Options: FREE (donations accepted) - bring proof of income or support Best way to get seen: Call and schedule an appointment OR come at 8am on the 1st Monday of every month (except for holidays) when it is first come/first served.     [facility] [phone]   Location: [place], [place] Clinic Hours: Friday mornings Services, Payment Options, Best way to get seen: Call for info

## 2021-05-02 ENCOUNTER — Telehealth: Payer: Self-pay

## 2021-05-02 NOTE — Telephone Encounter (Signed)
Transition Care Management Unsuccessful Follow-up Telephone Call  Date of discharge and from where:  05/01/2021 from [facility]  Attempts:  1st Attempt  Reason for unsuccessful TCM follow-up call:  Left voice message

## 2021-05-03 NOTE — Telephone Encounter (Signed)
Transition Care Management Unsuccessful Follow-up Telephone Call  Date of discharge and from where:  05/01/2021 from [facility]  Attempts:  2nd Attempt  Reason for unsuccessful TCM follow-up call:  Left voice message

## 2021-05-04 NOTE — Telephone Encounter (Signed)
Transition Care Management Unsuccessful Follow-up Telephone Call  Date of discharge and from where:  05/01/2021 from [facility]  Attempts:  3rd Attempt  Reason for unsuccessful TCM follow-up call:  Unable to reach patient

## 2021-05-10 ENCOUNTER — Other Ambulatory Visit: Payer: Self-pay | Admitting: Family Medicine

## 2021-05-10 DIAGNOSIS — F3341 Major depressive disorder, recurrent, in partial remission: Secondary | ICD-10-CM

## 2021-05-10 DIAGNOSIS — F411 Generalized anxiety disorder: Secondary | ICD-10-CM

## 2021-05-10 NOTE — Telephone Encounter (Signed)
Requested medications are due for refill today.  yes  Requested medications are on the active medications list.  yes  Last refill. 03/19/2021  Future visit scheduled.   no  Notes to clinic.  Pt was seen 02/28/2021. Pt was to RTC on 03/28/2021 - no show.  Please advise.    Requested Prescriptions  Pending Prescriptions Disp Refills   busPIRone (BUSPAR) 5 MG tablet [Pharmacy Med Name: BUSPIRONE HCL 5 MG TAB] 30 tablet 0    Sig: TAKE (1) TABLET BY MOUTH TWICE DAILY     Psychiatry: Anxiolytics/Hypnotics - Non-controlled Passed - 05/10/2021  4:31 PM      Passed - Valid encounter within last 6 months    Recent Outpatient Visits           2 months ago Recurrent major depressive disorder [facility]   [facility] Serafina Royals F, FNP   3 months ago Recurrent major depressive disorder [facility])   [facility] Bo Merino, FNP   1 year ago Abdominal pain, unspecified abdominal location   [facility] Delsa Grana, PA-C   1 year ago Right upper quadrant abdominal pain   [facility] Towanda Malkin, MD   1 year ago COPD with acute exacerbation [facility])   [facility] Delsa Grana, [place]

## 2021-05-11 ENCOUNTER — Telehealth: Payer: Self-pay | Admitting: *Deleted

## 2021-05-11 NOTE — Telephone Encounter (Signed)
Pt was last seen by you.

## 2021-05-11 NOTE — Telephone Encounter (Signed)
PA required for Entresto 24-26 mg tablet. PA has been submitted via covermymeds.

## 2021-05-11 NOTE — Telephone Encounter (Signed)
[employer] has not yet replied to your PA request. You may close this dialog, return to your dashboard, and perform other tasks.  To check for an update later, open this request again from your dashboard.  If [employer] has not replied to your request within 24 hours, please reach out to the plan using the phone number located on the back on the [employer] card.

## 2021-05-14 NOTE — Telephone Encounter (Signed)
Per fax received from [facility]. Delene Loll has been approved.Approved quantity: 60 tablets per 30 days. May fill up to a 34 day supply.

## 2021-05-16 DIAGNOSIS — Z419 Encounter for procedure for purposes other than remedying health state, unspecified: Secondary | ICD-10-CM | POA: Diagnosis not present

## 2021-05-25 ENCOUNTER — Ambulatory Visit: Payer: Self-pay

## 2021-05-25 NOTE — Telephone Encounter (Signed)
Summary: gout flare up   Big toe gout flare for 3 days requesting gout medication and would like it expedited   [place], [place] [facility] [place]  Phone: [phone]  Fax: [phone]      Chief Complaint: Right Great toe and foot Symptoms: Pain, swelling, redness Frequency: Started 3 days ago Pertinent Negatives: Patient denies fever Disposition: [] ED /[] Urgent Care (no appt availability in office) / [] Appointment(In office/virtual)/ []  [place] [facility]/ [] Home Care/ [] Refused Recommended Disposition /[] [place] [facility]/ []  Follow-up with PCP Additional Notes: States she has been treated for Gout "about 1 year ago." States it "would be hard for me to come in because I can hardly walk on my foot." Requesting medication be sent to her pharmacy. Please advise.  Answer Assessment - Initial Assessment Questions 1. ONSET: "When did the pain start?"      3 days ago 2. LOCATION: "Where is the pain located?"      Right Great toe and foot 3. PAIN: "How bad is the pain?"    (Scale 1-10; or mild, moderate, severe)  - MILD (1-3): doesn't interfere with normal activities.   - MODERATE (4-7): interferes with normal activities (e.g., work or school) or awakens from sleep, limping.   - SEVERE (8-10): excruciating pain, unable to do any normal activities, unable to walk.      10 4. WORK OR EXERCISE: "Has there been any recent work or exercise that involved this part of the body?"      No 5. CAUSE: "What do you think is causing the foot pain?"     Gout  6. OTHER SYMPTOMS: "Do you have any other symptoms?" (e.g., leg pain, rash, fever, numbness)     Swollen, red 7. PREGNANCY: "Is there any chance you are pregnant?" "When was your last menstrual period?"     No  Protocols used: Foot Pain-A-AH

## 2021-05-25 NOTE — Telephone Encounter (Signed)
Angelica Herrera since you last seen her, could you prescribe anything for her? Or she would have to be seen in office/mychart visit?

## 2021-05-25 NOTE — Telephone Encounter (Signed)
Pt has been notified.

## 2021-06-01 ENCOUNTER — Ambulatory Visit: Payer: Self-pay | Admitting: *Deleted

## 2021-06-01 ENCOUNTER — Encounter: Payer: Self-pay | Admitting: Internal Medicine

## 2021-06-01 ENCOUNTER — Telehealth (INDEPENDENT_AMBULATORY_CARE_PROVIDER_SITE_OTHER): Payer: Medicaid Other | Admitting: Internal Medicine

## 2021-06-01 DIAGNOSIS — M25562 Pain in left knee: Secondary | ICD-10-CM | POA: Diagnosis not present

## 2021-06-01 DIAGNOSIS — M10479 Other secondary gout, unspecified ankle and foot: Secondary | ICD-10-CM | POA: Diagnosis not present

## 2021-06-01 DIAGNOSIS — M25561 Pain in right knee: Secondary | ICD-10-CM | POA: Diagnosis not present

## 2021-06-01 MED ORDER — COLCHICINE 0.6 MG PO TABS: 0.6000 mg | ORAL_TABLET | Freq: Every day | ORAL | 0 refills | Status: DC | PRN

## 2021-06-01 MED ORDER — METHYLPREDNISOLONE 4 MG PO TBPK: ORAL_TABLET | ORAL | 0 refills | Status: DC

## 2021-06-01 NOTE — Progress Notes (Deleted)
Acute Office Visit  Subjective:    Patient ID: Angelica Herrera, female    DOB: 1970/05/07, 51 y.o.   MRN: [id number]  No chief complaint on file.   HPI Patient is in today for gout flare.   GOUT Duration:{Blank single:19197::"chronic","days","weeks","months"} Right 1st metatarsophalangeal pain: {Blank single:19197::"yes","no"} Left 1st metatarsophalangeal pain: {Blank single:19197::"yes","no"} Right knee pain: {Blank single:19197::"yes","no"} Left knee pain: {Blank single:19197::"yes","no"} Severity: {Blank single:19197::"mild","moderate","severe","1/10","2/10","3/10","4/10","5/10","6/10","7/10","8/10","9/10","10/10"}  Quality: {Blank multiple:19196::"sharp","dull","aching","burning","cramping","ill-defined","itchy","pressure-like","pulling","shooting","sore","stabbing","tender","tearing","throbbing"} Swelling: {Blank single:19197::"yes","no"} Redness: {Blank single:19197::"yes","no"} Trauma: {Blank single:19197::"yes","no"} Recent dietary change or indiscretion: {Blank single:19197::"yes","no"} Fevers: {Blank single:19197::"yes","no"} Nausea/vomiting: {Blank single:19197::"yes","no"} Aggravating factors: Alleviating factors:  Status:  {Blank multiple:19196::"better","worse","stable","fluctuating"} Treatments attempted:   Past Medical History:  Diagnosis Date   Arthritis    gout   CAD (coronary artery disease)    a. 02/2012 s/p anterolateral STEMI/Cath/PCI: LM 100 (3.0x15 DES ), LAD sm, severely diseased (PTCA w/ 1.17m balloon), LCX nl, RI moderate size, nl, RCA 30-478mAM/PDA/PLA nl, R->L collats noted; b. cath 08/03/15: widely patent ostLM stent at 10%, mLAD 60%,  mRCA 30%, EF 35-40%, ant wall HK, mildly elevated LVEDP; c. 02/2017 MV: ant infarct, mild peri-infarct isch (similar to 2017 study)->Med Rx.   Chronic combined systolic (congestive) and diastolic (congestive) heart failure ([place]   a. 02/2017 Echo: EF 30-35%, diff HK, sev HK/poss AK of ant and lat walls. Gr1 DD.  Mild to mod MR. Nl RV fxn. Mod TR.   CKD (chronic kidney disease), stage IV (HCC)    HTN (hypertension)    Hyperlipidemia    Hypothyroidism    Ischemic cardiomyopathy    a. 02/2012 Echo: EF 40-45%, ant/lat HK, Gr1 DD, Mild MR, PASP 4375m; b. 02/2017 Echo: EF 30-35%.   NSVT (nonsustained ventricular tachycardia)    a. 02/2012 post-MI   Obesity    Pancreatitis    S/P primary angioplasty with coronary stent 03/04/2018   Dual antiplatelet therapy INDEFINITELY per cardiology note Nov 2019   Tobacco abuse     Past Surgical History:  Procedure Laterality Date   ABDOMINAL HYSTERECTOMY     CARDIAC CATHETERIZATION  2013   [facility] s/p stent   CARDIAC CATHETERIZATION Left 08/03/2015   Procedure: Left Heart Cath and Coronary Angiography;  Surgeon: MuhWellington HampshireD;  [facility];  Service: Cardiovascular;  Laterality: Left;   DILATION AND CURETTAGE OF UTERUS     LEFT HEART CATHETERIZATION WITH CORONARY ANGIOGRAM N/A 02/11/2012   Procedure: LEFT HEART CATHETERIZATION WITH CORONARY ANGIOGRAM;  Surgeon: MicSherren MochaD;  [facility];  Service: Cardiovascular;  Laterality: N/A;   RENAL INTERVENTION N/A 11/17/2018   Procedure: RENAL INTERVENTION;  Surgeon: SchKatha CabalD;  [facility];  Service: Cardiovascular;  Laterality: N/A;    Family History  Problem Relation Age of Onset   Hypertension Father    Thyroid disease Father    AAA (abdominal aortic aneurysm) Father    Hypertension Mother    Hyperlipidemia Mother    Diabetes Mother    Kidney disease Mother    Hypertension Sister    Hypertension Sister    Supraventricular tachycardia Sister    Hypertension Brother    Hypertension Brother    Hypertension Daughter    Cancer Paternal Grandfather        lung    Social History   Socioeconomic History   Marital status: Legally Separated    Spouse name: Not on file   Number of children: Not on file   Years of education: Not on  file    Highest education level: Not on file  Occupational History   Not on file  Tobacco Use   Smoking status: Every Day    Packs/day: 0.25    Years: 30.00    Pack years: 7.50    Types: Cigarettes   Smokeless tobacco: Never  Vaping Use   Vaping Use: Never used  Substance and Sexual Activity   Alcohol use: Not Currently    Comment: Says she previously drank socially - never a heavy drinker.  No etoh since 08/2018.   Drug use: No    Types: Marijuana   Sexual activity: Not on file  Other Topics Concern   Not on file  Social History Narrative   Lives in [place] w/ her dtr.  Does not routinely exercise.  Disabled/doesn't work.   Social Determinants of Health   Financial Resource Strain: Not on file  Food Insecurity: Not on file  Transportation Needs: Not on file  Physical Activity: Not on file  Stress: Not on file  Social Connections: Not on file  Intimate Partner Violence: Not on file    Outpatient Medications Prior to Visit  Medication Sig Dispense Refill   amoxicillin (AMOXIL) 500 MG tablet Take 1 tablet (500 mg total) by mouth 3 (three) times daily. 30 tablet 0   aspirin 81 MG tablet Take 1 tablet (81 mg total) by mouth daily. 30 tablet    atorvastatin (LIPITOR) 80 MG tablet TAKE ONE TABLET BY MOUTH AT BEDTIME. 90 tablet 1   busPIRone (BUSPAR) 5 MG tablet TAKE (1) TABLET BY MOUTH TWICE DAILY 30 tablet 0   carvedilol (COREG) 12.5 MG tablet Take 1 tablet (12.5 mg total) by mouth 2 (two) times daily with a meal. 180 tablet 2   cetirizine (ZYRTEC) 10 MG tablet TAKE (1) TABLET BY MOUTH DAILY AT BEDTIME 30 tablet 11   clopidogrel (PLAVIX) 75 MG tablet TAKE (1) TABLET BY MOUTH EVERY DAY 90 tablet 0   furosemide (LASIX) 20 MG tablet Take 1 tablet (20 mg) by mouth once daily 90 tablet 2   gabapentin (NEURONTIN) 300 MG capsule TAKE (1) CAPSULE BY MOUTH TWICE DAILY 120 capsule 1   levothyroxine (SYNTHROID) 112 MCG tablet TAKE (1) TABLET BY MOUTH EVERY DAY BEFORE BREAKFAST 90 tablet 1    nitroGLYCERIN (NITROSTAT) 0.4 MG SL tablet Place 1 tablet (0.4 mg total) under the tongue every 5 (five) minutes x 3 doses as needed for chest pain. 25 tablet 3   PARoxetine (PAXIL) 40 MG tablet TAKE ONE (1) TABLET BY MOUTH ONCE DAILY 90 tablet 1   sacubitril-valsartan (ENTRESTO) 24-26 MG Take 1 tablet by mouth 2 (two) times daily. 180 tablet 2   Facility-Administered Medications Prior to Visit  Medication Dose Route Frequency Provider Last Rate Last Admin   mometasone-formoterol (DULERA) 200-5 MCG/ACT inhaler 2 puff  2 puff Inhalation BID Delsa Grana, PA-C        No Known Allergies  Review of Systems     Objective:    Physical Exam  There were no vitals taken for this visit. Wt Readings from Last 3 Encounters:  05/01/21 193 lb 5.5 oz (87.7 kg)  02/28/21 193 lb 6.4 oz (87.7 kg)  02/16/21 193 lb 2 oz (87.6 kg)    Health Maintenance Due  Topic Date Due   Hepatitis C Screening  Never done   Zoster Vaccines- Shingrix (1 of 2) Never done   COLONOSCOPY (Pts 45-29yr Insurance coverage will need to be confirmed)  Never done  MAMMOGRAM  Never done    There are no preventive care reminders to display for this patient.   Lab Results  Component Value Date   TSH 27.39 (H) 01/31/2021   Lab Results  Component Value Date   WBC 8.9 01/31/2021   HGB 16.6 (H) 01/31/2021   HCT 49.1 (H) 01/31/2021   MCV 90.1 01/31/2021   PLT 263 01/31/2021   Lab Results  Component Value Date   NA 144 01/31/2021   K 3.8 01/31/2021   CO2 26 01/31/2021   GLUCOSE 84 01/31/2021   BUN 20 01/31/2021   CREATININE 1.58 (H) 01/31/2021   BILITOT 0.8 01/31/2021   ALKPHOS 82 07/17/2020   AST 20 01/31/2021   ALT 23 01/31/2021   PROT 7.0 01/31/2021   ALBUMIN 3.9 07/17/2020   CALCIUM 9.5 01/31/2021   ANIONGAP 13 07/17/2020   EGFR 39 (L) 01/31/2021   Lab Results  Component Value Date   CHOL 204 (H) 01/31/2021   Lab Results  Component Value Date   HDL 66 01/31/2021   Lab Results  Component  Value Date   LDLCALC 97 01/31/2021   Lab Results  Component Value Date   TRIG 288 (H) 01/31/2021   Lab Results  Component Value Date   CHOLHDL 3.1 01/31/2021   Lab Results  Component Value Date   HGBA1C 5.5 01/31/2021       Assessment & Plan:   Problem List Items Addressed This Visit   None    No orders of the defined types were placed in this encounter.    Teodora Medici, DO

## 2021-06-01 NOTE — Progress Notes (Signed)
Virtual Visit via Video Note  I connected with Angelica Herrera on 06/01/21 at 11:40 AM EST by a video enabled telemedicine application and verified that I am speaking with the correct person using two identifiers.  Location: Patient: Home Provider: [facility]   I discussed the limitations of evaluation and management by telemedicine and the availability of in person appointments. The patient expressed understanding and agreed to proceed.  History of Present Illness:  Patient is presenting via telemedicine for gout flare. Started in right foot for 1 week and now also in left foot and both knees. Ate more ham recently. Last flare about 1 year ago. On Plavix.   GOUT Duration: 2 weeks Right 1st metatarsophalangeal pain: yes Left 1st metatarsophalangeal pain: yes Right knee pain: yes Left knee pain: yes Severity: 10/10  Quality: sharp and sore Swelling: yes Redness: yes Trauma: no Recent dietary change or indiscretion: yes Fevers: no Nausea/vomiting: no Aggravating factors: Alleviating factors:  Status:  worse Treatments attempted: Tylenol - ran out but didn't help     Observations/Objective:  General: no acute distress Neuro: answers questions appropriately MSK: difficult to examine over bad connection but some redness and swelling noted in the right 1st metatarsophalangeal  Assessment and Plan:  1. Acute gout due to other secondary cause involving toe, unspecified laterality: Avoid NSAIDs due to Plavix, treat with Medrol dose pack and Colchicine as needed for symptoms. Discuss starting Allopurinol after flare. Discussed foods to avoid to prevent future flares.   - colchicine 0.6 MG tablet; Take 1 tablet (0.6 mg total) by mouth daily as needed (gout).  Dispense: 15 tablet; Refill: 0 - methylPREDNISolone (MEDROL DOSEPAK) 4 MG TBPK tablet; Day 1: Take 8 mg (2 tablets) before breakfast, 4 mg (1 tablet) after lunch, 4 mg (1 tablet) after supper, and 8 mg (2 tablets) at bedtime. Day  2:Take 4 mg (1 tablet) before breakfast, 4 mg (1 tablet) after lunch, 4 mg (1 tablet) after supper, and 8 mg (2 tablets) at bedtime. Day 3: Take 4 mg (1 tablet) before breakfast, 4 mg (1 tablet) after lunch, 4 mg (1 tablet) after supper, and 4 mg (1 tablet) at bedtime. Day 4: Take 4 mg (1 tablet) before breakfast, 4 mg (1 tablet) after lunch, and 4 mg (1 tablet) at bedtime. Day 5: Take 4 mg (1 tablet) before breakfast and 4 mg (1 tablet) at bedtime. Day 6: Take 4 mg (1 tablet) before breakfast.  Dispense: 1 each; Refill: 0  Follow Up Instructions: PRN    I discussed the assessment and treatment plan with the patient. The patient was provided an opportunity to ask questions and all were answered. The patient agreed with the plan and demonstrated an understanding of the instructions.   The patient was advised to call back or seek an in-person evaluation if the symptoms worsen or if the condition fails to improve as anticipated.  I provided 13 minutes of non-face-to-face time during this encounter.   Teodora Medici, DO

## 2021-06-01 NOTE — Telephone Encounter (Signed)
Pt has appt today

## 2021-06-01 NOTE — Telephone Encounter (Signed)
° ° ° ° °  Chief Complaint: Bilateral foot pain Symptoms: 10/10 pain, both feet, "Gout flare up" Warm to touch Frequency: 2 weeks ago, worsening Pertinent Negatives: Patient denies  Disposition: [] ED /[] Urgent Care (no appt availability in office) / [x] Appointment(In office/virtual)/ []  [place] [facility]/ [] Home Care/ [] Refused Recommended Disposition /[] [place] [facility]/ []  Follow-up with PCP Additional Notes: Pt triaged 05/25/21, aware needed appt. Pt tearful, states she cannot get in for appt, cannot walk due to pain. Requesting "Just a few meds so I can get over this, then I can come in." Secured virtual for this AM with Dr. Rosana Berger if appropriate. Pt called before practice open.  Please advise: [phone]     Reason for Disposition  [1] SEVERE pain (e.g., excruciating, unable to do any normal activities) AND [2] not improved after 2 hours of pain medicine  Answer Assessment - Initial Assessment Questions 1. ONSET: "When did the pain start?"      2 weeks ago, worsening 2. LOCATION: "Where is the pain located?"      Both feet 3. PAIN: "How bad is the pain?"    (Scale 1-10; or mild, moderate, severe)  - MILD (1-3): doesn't interfere with normal activities.   - MODERATE (4-7): interferes with normal activities (e.g., work or school) or awakens from sleep, limping.   - SEVERE (8-10): excruciating pain, unable to do any normal activities, unable to walk.      10/10 4. WORK OR EXERCISE: "Has there been any recent work or exercise that involved this part of the body?"      no 5. CAUSE: "What do you think is causing the foot pain?"     gout 6. OTHER SYMPTOMS: "Do you have any other symptoms?" (e.g., leg pain, rash, fever, numbness)     Feet warm to touch  Protocols used: Foot Pain-A-AH

## 2021-06-04 ENCOUNTER — Other Ambulatory Visit: Payer: Self-pay | Admitting: Nurse Practitioner

## 2021-06-04 DIAGNOSIS — G8929 Other chronic pain: Secondary | ICD-10-CM

## 2021-06-05 NOTE — Telephone Encounter (Signed)
Requested Prescriptions  Pending Prescriptions Disp Refills   gabapentin (NEURONTIN) 300 MG capsule [Pharmacy Med Name: GABAPENTIN 300 MG CAP] 120 capsule 1    Sig: TAKE (1) CAPSULE BY MOUTH TWICE DAILY     Neurology: Anticonvulsants - gabapentin Failed - 06/04/2021 10:37 AM      Failed - Cr in normal range and within 360 days    Creat  Date Value Ref Range Status  01/31/2021 1.58 (H) 0.50 - 1.03 mg/dL Final         Passed - Completed PHQ-2 or PHQ-9 in the last 360 days      Passed - Valid encounter within last 12 months    Recent Outpatient Visits          4 days ago Acute gout due to other secondary cause involving toe, unspecified laterality   [place], DO   3 months ago Recurrent major depressive disorder [facility])   [facility] Bo Merino, FNP   4 months ago Recurrent major depressive disorder [facility])   [facility] Bo Merino, FNP   1 year ago Abdominal pain, unspecified abdominal location   [facility] Delsa Grana, PA-C   1 year ago Right upper quadrant abdominal pain   [facility] Towanda Malkin, MD

## 2021-06-13 DIAGNOSIS — Z419 Encounter for procedure for purposes other than remedying health state, unspecified: Secondary | ICD-10-CM | POA: Diagnosis not present

## 2021-07-14 DIAGNOSIS — Z419 Encounter for procedure for purposes other than remedying health state, unspecified: Secondary | ICD-10-CM | POA: Diagnosis not present

## 2021-07-17 ENCOUNTER — Other Ambulatory Visit: Payer: Self-pay | Admitting: Nurse Practitioner

## 2021-07-17 DIAGNOSIS — I701 Atherosclerosis of renal artery: Secondary | ICD-10-CM

## 2021-07-17 DIAGNOSIS — F411 Generalized anxiety disorder: Secondary | ICD-10-CM

## 2021-07-17 DIAGNOSIS — F3341 Major depressive disorder, recurrent, in partial remission: Secondary | ICD-10-CM

## 2021-07-17 DIAGNOSIS — I739 Peripheral vascular disease, unspecified: Secondary | ICD-10-CM

## 2021-07-17 DIAGNOSIS — Z9889 Other specified postprocedural states: Secondary | ICD-10-CM

## 2021-07-18 NOTE — Telephone Encounter (Signed)
Requested Prescriptions  ?Pending Prescriptions Disp Refills  ?? clopidogrel (PLAVIX) 75 MG tablet [Pharmacy Med Name: CLOPIDOGREL BISULFATE 75 MG TAB] 90 tablet 0  ?  Sig: TAKE (1) TABLET BY MOUTH EVERY DAY  ?  ? Hematology: Antiplatelets - clopidogrel Failed - 07/17/2021  4:40 PM  ?  ?  Failed - HCT in normal range and within 180 days  ?  HCT  ?Date Value Ref Range Status  ?01/31/2021 49.1 (H) 35.0 - 45.0 % Final  ? ?Hematocrit  ?Date Value Ref Range Status  ?02/27/2018 51.5 (H) 34.0 - 46.6 % Final  ?   ?  ?  Failed - HGB in normal range and within 180 days  ?  Hemoglobin  ?Date Value Ref Range Status  ?01/31/2021 16.6 (H) 11.7 - 15.5 g/dL Final  ?02/27/2018 17.6 (H) 11.1 - 15.9 g/dL Final  ?   ?  ?  Failed - Cr in normal range and within 360 days  ?  Creat  ?Date Value Ref Range Status  ?01/31/2021 1.58 (H) 0.50 - 1.03 mg/dL Final  ?   ?  ?  Passed - PLT in normal range and within 180 days  ?  Platelets  ?Date Value Ref Range Status  ?01/31/2021 263 140 - 400 Thousand/uL Final  ?02/27/2018 315 150 - 450 x10E3/uL Final  ?   ?  ?  Passed - Valid encounter within last 6 months  ?  Recent Outpatient Visits   ?      ? 1 month ago Acute gout due to other secondary cause involving toe, unspecified laterality  ? [facility] Teodora Medici, DO  ? 4 months ago Recurrent major depressive disorder [facility])  ? [facility] Bo Merino, FNP  ? 5 months ago Recurrent major depressive disorder ([place])  ? [place], FNP  ? 1 year ago Abdominal pain, unspecified abdominal location  ? [facility] Delsa Grana, [place]  ? 1 year ago Right upper quadrant abdominal pain  ? [facility] Lebron Conners D, MD  ?  ?  ? ?  ?  ?  ?? busPIRone (BUSPAR) 5 MG tablet [Pharmacy Med Name: BUSPIRONE HCL 5 MG TAB] 30 tablet 0  ?  Sig: TAKE (1) TABLET BY MOUTH TWICE DAILY  ?  ? Psychiatry: Anxiolytics/Hypnotics - Non-controlled  Passed - 07/17/2021  4:40 PM  ?  ?  Passed - Valid encounter within last 12 months  ?  Recent Outpatient Visits   ?      ? 1 month ago Acute gout due to other secondary cause involving toe, unspecified laterality  ? [facility] Teodora Medici, DO  ? 4 months ago Recurrent major depressive disorder [facility])  ? [facility] Bo Merino, FNP  ? 5 months ago Recurrent major depressive disorder ([place])  ? [place], FNP  ? 1 year ago Abdominal pain, unspecified abdominal location  ? [facility] Delsa Grana, [place]  ? 1 year ago Right upper quadrant abdominal pain  ? [facility] Lebron Conners D, MD  ?  ?  ? ?  ?  ?  ? ? ?

## 2021-08-07 NOTE — Telephone Encounter (Signed)
Attempted to schedule no ans no vm  

## 2021-08-13 DIAGNOSIS — Z419 Encounter for procedure for purposes other than remedying health state, unspecified: Secondary | ICD-10-CM | POA: Diagnosis not present

## 2021-08-23 NOTE — Telephone Encounter (Signed)
scheduled

## 2021-08-28 ENCOUNTER — Other Ambulatory Visit: Payer: Self-pay | Admitting: Family Medicine

## 2021-08-28 DIAGNOSIS — F3341 Major depressive disorder, recurrent, in partial remission: Secondary | ICD-10-CM

## 2021-08-28 DIAGNOSIS — F411 Generalized anxiety disorder: Secondary | ICD-10-CM

## 2021-09-03 NOTE — Progress Notes (Unsigned)
Cardiology Office Note    Date:  09/05/2021   ID:  Angelica Herrera, DOB 06/28/1970, MRN [id number]  PCP:  Angelica Grana, PA-C  Cardiologist:  Kathlyn Sacramento, MD  Electrophysiologist:  None   Chief Complaint: Follow-up  History of Present Illness:   Angelica Herrera is a 51 y.o. female with history of CAD with anterolateral ST elevation MI in 01/2012 with PTCA/DES to the left main at that time as detailed below complicated by hemodynamic instability and VT, HFrEF secondary to ICM, left renal artery stenosis status post left renal artery stenting in 11/2018 by vascular surgery, CKD stage III, HLD, hypothyroidism, depression, and tobacco use who presents for follow-up of her CAD and cardiomyopathy.  She was admitted to the hospital in 01/2012 with an anterolateral STEMI. She underwent emergent cardiac catheterization which showed occlusion of the left main coronary artery. There was significant difficulty engaging the left main, though she ultimately underwent successful PCI/DES. She also had PTCA done to the LAD due to embolization from the left main. Post procedure she had significant hemodynamic instablity with VT and hypotension. She was noted to have severe MR initially, however subsequent echocardiogram showed an EF of 45% with no significant MR. She was lost to follow up from 2013-2017. Upon re-establishing with our group in 2017, she continued to smoke and had been off her medications. In 2017, she had atypical chest pain with a Lexiscan MPI showing prior anterior infarct with mild peri-infarct ischemia and an EF of 33%. She underwent repeat cardiac cath at that time which showed a patent left main stent with moderate mid LAD stenosis and an occluded D2 with faint collaterals. The RCA was very large with mild mid stenosis. EF was 35-40% with anterior wall hypokinesis. Continued medical therapy was advised. She underwent repeat nuclear stress testing in 02/2017 for recurrent chest pain that  showed a similar finding of prior anterior infarct with mild peri-infarct ischemia.  Echo at that time demonstrated an EF of 30 to 35%, diffuse hypokinesis with severe hypokinesis/possible akinesis of the anterior and lateral walls, grade 1 diastolic dysfunction, mild to moderate mitral regurgitation, normal RV systolic function, and mildly elevated PASP.  Most recent echo from 02/2019 demonstrated an EF of 35 to 40%, anterior and septal wall hypokinesis, grade 2 diastolic dysfunction, mildly dilated LV internal cavity size, normal RV systolic function with mildly enlarged ventricular cavity, mildly to moderately dilated left atrium, mildly dilated right atrium, moderately elevated PASP estimated at 51.2 mmHg.  She was last seen in the office in 02/2021 and had been out of carvedilol, Entresto, and furosemide.  She was without symptoms of angina or decompensation.  She continued to smoke one fourth a pack of cigarettes per day.  She had gained significant weight over the preceding 6 months.  GDMT was reinitiated.  She comes in doing well from a cardiac perspective and is without symptoms of angina or decompensation.  No dizziness, presyncope, or syncope.  No lower extremity swelling, abdominal distention, or orthopnea.  She does try and watch her salt intake.  She reports frequently missing her p.m. doses of Entresto and carvedilol.  She reports adherence to morning medications, including DAPT.  No falls or symptoms concerning for bleeding.  She reports her blood pressure is well controlled at home.  She indicates her blood pressure is typically elevated in doctor's offices secondary to anxiety.  She also feels like her blood pressure is elevated this morning in the context of having dispensed a  [employer].  She does continue to smoke one fourth a pack of cigarettes per day.  Her weight is stable.   Labs independently reviewed: 01/2021 - TSH 27.39, A1c 5.5, TC 204, TG 288, HDL 66, LDL 97, BUN 20, serum  creatinine 1.58, potassium 3.8, albumin 4.4, AST/ALT normal, Hgb 16.6, PLT 263  Past Medical History:  Diagnosis Date   Accelerated hypertension 11/13/2018   Allergic rhinitis due to pollen 06/28/2015   Anxiety with depression 02/18/2020   Arthritis    gout   Chest pain 11/12/2018   Chronic combined systolic (congestive) and diastolic (congestive) heart failure ([place])    a. 02/2017 Echo: EF 30-35%, diff HK, sev HK/poss AK of ant and lat walls. Gr1 DD. Mild to mod MR. Nl RV fxn. Mod TR.   Chronic obstructive pulmonary disease ([place]) 09/02/2019   tx with trelegy and SABA   Chronic systolic heart failure ([place]) 11/21/2017   CKD (chronic kidney disease), stage IV ([place])    Controlled substance agreement signed 09/07/2015   Coronary artery disease involving native coronary artery    Degenerative disc disease, cervical 03/17/2015   Depression, recurrent ([place]) 12/02/2017   Essential hypertension, malignant 02/12/2012   GAD (generalized anxiety disorder) 03/17/2015   History of acute anterior wall MI 02/12/2012   Hyperlipidemia    Hypothyroidism    Ischemic cardiomyopathy    a. 02/2012 Echo: EF 40-45%, ant/lat HK, Gr1 DD, Mild MR, PASP 71mHg; b. 02/2017 Echo: EF 30-35%.   NSVT (nonsustained ventricular tachycardia) [place]    a. 02/2012 post-MI   Obesity    Pancreatitis    Rhinosinusitis 09/02/2019   S/P primary angioplasty with coronary stent 03/04/2018   Dual antiplatelet therapy INDEFINITELY per cardiology note Nov 2019   Squamous cell carcinoma of back 04/20/2019   referrred to sugery for f/up excision, derm not available for several month, lesion with rapid change cannot wait, still see derm for skin survey etc   Stage 3b chronic kidney disease ([place] 02/18/2020   Tobacco abuse    Tobacco dependence 02/18/2020    Past Surgical History:  Procedure Laterality Date   ABDOMINAL HYSTERECTOMY     CARDIAC CATHETERIZATION  2013   [facility] s/p stent   CARDIAC CATHETERIZATION Left 08/03/2015   Procedure: Left  Heart Cath and Coronary Angiography;  Surgeon: MWellington Hampshire MD;  [facility];  Service: Cardiovascular;  Laterality: Left;   DILATION AND CURETTAGE OF UTERUS     LEFT HEART CATHETERIZATION WITH CORONARY ANGIOGRAM N/A 02/11/2012   Procedure: LEFT HEART CATHETERIZATION WITH CORONARY ANGIOGRAM;  Surgeon: MSherren Mocha MD;  [facility];  Service: Cardiovascular;  Laterality: N/A;   RENAL INTERVENTION N/A 11/17/2018   Procedure: RENAL INTERVENTION;  Surgeon: SKatha Cabal MD;  [facility];  Service: Cardiovascular;  Laterality: N/A;    Current Medications: Current Meds  Medication Sig   aspirin 81 MG tablet Take 1 tablet (81 mg total) by mouth daily.   atorvastatin (LIPITOR) 80 MG tablet TAKE ONE TABLET BY MOUTH AT BEDTIME.   busPIRone (BUSPAR) 5 MG tablet TAKE (1) TABLET BY MOUTH TWICE DAILY   carvedilol (COREG) 12.5 MG tablet Take 1 tablet (12.5 mg total) by mouth 2 (two) times daily with a meal.   cetirizine (ZYRTEC) 10 MG tablet TAKE (1) TABLET BY MOUTH DAILY AT BEDTIME   clopidogrel (PLAVIX) 75 MG tablet TAKE (1) TABLET BY MOUTH EVERY DAY   colchicine 0.6 MG tablet Take 1 tablet (0.6  mg total) by mouth daily as needed (gout).   ENTRESTO 24-26 MG TAKE ONE TABLET BY MOUTH TWICE DAILY.   furosemide (LASIX) 20 MG tablet TAKE (1) TABLET BY MOUTH EVERY DAY   furosemide (LASIX) 20 MG tablet Take 1 tablet (20 mg) by mouth once daily   gabapentin (NEURONTIN) 300 MG capsule TAKE (1) CAPSULE BY MOUTH TWICE DAILY   levothyroxine (SYNTHROID) 112 MCG tablet TAKE (1) TABLET BY MOUTH EVERY DAY BEFORE BREAKFAST   nitroGLYCERIN (NITROSTAT) 0.4 MG SL tablet Place 1 tablet (0.4 mg total) under the tongue every 5 (five) minutes x 3 doses as needed for chest pain.   PARoxetine (PAXIL) 40 MG tablet TAKE ONE (1) TABLET BY MOUTH ONCE DAILY   sacubitril-valsartan (ENTRESTO) 24-26 MG Take 1 tablet by mouth 2 (two) times daily.   Current Facility-Administered  Medications for the 09/05/21 encounter (Office Visit) with [name], PA-C  Medication   mometasone-formoterol (DULERA) 200-5 MCG/ACT inhaler 2 puff    Allergies:   Patient has no known allergies.   Social History   Socioeconomic History   Marital status: Legally Separated    Spouse name: Not on file   Number of children: Not on file   Years of education: Not on file   Highest education level: Not on file  Occupational History   Not on file  Tobacco Use   Smoking status: Every Day    Packs/day: 0.25    Years: 30.00    Pack years: 7.50    Types: Cigarettes   Smokeless tobacco: Never  Vaping Use   Vaping Use: Never used  Substance and Sexual Activity   Alcohol use: Not Currently    Comment: Says she previously drank socially - never a heavy drinker.  No etoh since 08/2018.   Drug use: No    Types: Marijuana   Sexual activity: Not on file  Other Topics Concern   Not on file  Social History Narrative   Lives in [place] w/ her dtr.  Does not routinely exercise.  Disabled/doesn't work.   Social Determinants of Health   Financial Resource Strain: Not on file  Food Insecurity: Not on file  Transportation Needs: Not on file  Physical Activity: Not on file  Stress: Not on file  Social Connections: Not on file     Family History:  The patient's family history includes AAA (abdominal aortic aneurysm) in her father; Cancer in her paternal grandfather; Diabetes in her mother; Hyperlipidemia in her mother; Hypertension in her brother, brother, daughter, father, mother, sister, and sister; Kidney disease in her mother; Supraventricular tachycardia in her sister; Thyroid disease in her father.  ROS:   12-point review of systems is negative unless otherwise noted in the HPI.   EKGs/Labs/Other Studies Reviewed:    Studies reviewed were summarized above. The additional studies were reviewed today:  2D echo 02/19/2019:  1. Left ventricular ejection fraction, by visual estimation,  is 35 to  40%. The left ventricle has moderately decreased function. There is no  left ventricular hypertrophy. Anterior and Septal wall hypokinesis.   2. Left ventricular diastolic parameters are consistent with Grade II  diastolic dysfunction (pseudonormalization).   3. Mildly dilated left ventricular internal cavity size.   4. Global right ventricle has normal systolic function.The right  ventricular size is mildly enlarged. No increase in right ventricular wall  thickness.   5. Left atrial size was mild-moderately dilated.   6. Right atrial size was mildly dilated.   7. Moderately elevated  pulmonary artery systolic pressure.   8. The tricuspid regurgitant velocity is 2.80 m/s, and with an assumed  right atrial pressure of 20 mmHg, the estimated right ventricular systolic  pressure is moderately elevated at 51.2 mmHg.   9. Small pericardial effusion. __________  2D echo 02/21/2017: - Left ventricle: The cavity size was normal. Systolic function was    moderately to severely reduced. The estimated ejection fraction    was in the range of 30% to 35%. Diffuse hypokinesis with severe    hypokinesis /possible akinesis of the anterior and lateral walls.    Doppler parameters are consistent with abnormal left ventricular    relaxation (grade 1 diastolic dysfunction).  - Mitral valve: There was mild to moderate regurgitation.  - Left atrium: The atrium was normal in size.  - Right ventricle: Systolic function was normal.  - Tricuspid valve: There was moderate regurgitation.  - Pulmonary arteries: Systolic pressure was mildly elevated. __________  Carlton Adam MPI 02/14/2017: There was no ST segment deviation noted during stress. No T wave inversion was noted during stress. Defect 1: There is a medium defect of severe severity present in the basal anterior, basal anterolateral, mid anterior and mid anterolateral location. This is an intermediate risk study. The left ventricular ejection  fraction is moderately decreased (30-44%). Findings consistent with prior myocardial infarction with mild peri-infarct ischemia. __________  2D echo 10/06/2015: - Left ventricle: The cavity size was normal. Systolic function was    moderately reduced. The estimated ejection fraction was in the    range of 35% to 40%. Hypokinesis of the anterior myocardium.    Hypokinesis of the anteroseptal myocardium. Doppler parameters    are consistent with abnormal left ventricular relaxation (grade 1    diastolic dysfunction).  - Mitral valve: Calcified annulus. There was mild regurgitation.  - Left atrium: The atrium was normal in size.  - Right ventricle: Systolic function was normal.  - Pulmonary arteries: Systolic pressure was within the normal    range. ___________  LHC 08/03/2015: Ost LM to LM lesion, 10% stenosed. The lesion was previously treated with a stent (unknown type). Mid LAD lesion, 60% stenosed. Mid RCA lesion, 30% stenosed. There is moderate left ventricular systolic dysfunction.   1. Widely patent ostial left main stent with moderate mid LAD stenosis and occluded second diagonal with faint collaterals. The right coronary artery is very large with mild mid stenosis. 2. Moderately reduced LV systolic function with an ejection fraction of 35-40% with significant anterior wall hypokinesis. Mildly elevated left ventricular end-diastolic pressure. 3. Significant radial artery spasm which required increased amounts of sedation.   Recommendations: Continue medical therapy for coronary artery disease and chronic systolic heart failure. We will need to transition her from clonidine to an ACE inhibitor or ARB with close monitoring of renal function. __________  Carlton Adam MPI 07/28/2015: Pharmacological myocardial perfusion imaging study with attenuation corrected images suggesting mild anterior wall ischemia, mid to apical region Nonattenuation corrected images also with mild mid to apical  anterior wall ischemia Patient attempted exercise on bruce protocol though unable to reach target heart rate, changed to lexiscan Anteroseptal wall hypokinesis noted, EF estimated at 33% No EKG changes concerning for ischemia at peak stress or in recovery. Moderate risk scan with mild ischemia as above __________  2D echo 02/12/2012: - Left ventricle: The cavity size was normal. Wall thickness    was increased in a pattern of mild LVH. Systolic function    was mildly to moderately reduced. The estimated  ejection    fraction was in the range of 40% to 45%. Hypokinesis of    the entireanterior and lateral myocardium. Doppler    parameters are consistent with abnormal left ventricular    relaxation (grade 1 diastolic dysfunction).  - Mitral valve: Mild regurgitation.  - Pulmonary arteries: PA peak pressure: 12m Hg (S).   EKG:  EKG is ordered today.  The EKG ordered today demonstrates NSR, 69 bpm, incomplete RBBB, prior septal infarct, consistent with prior tracings  Recent Labs: 01/31/2021: ALT 23; BUN 20; Creat 1.58; Hemoglobin 16.6; Platelets 263; Potassium 3.8; Sodium 144; TSH 27.39  Recent Lipid Panel    Component Value Date/Time   CHOL 204 (H) 01/31/2021 1428   CHOL 136 02/27/2018 1207   TRIG 288 (H) 01/31/2021 1428   HDL 66 01/31/2021 1428   HDL 53 02/27/2018 1207   CHOLHDL 3.1 01/31/2021 1428   VLDL 15 11/13/2018 0628   LDLCALC 97 01/31/2021 1428    PHYSICAL EXAM:    VS:  BP (!) 188/122   Pulse 69   Ht 5' 6.6" (1.692 m)   Wt 192 lb 12.8 oz (87.5 kg)   SpO2 98%   BMI 30.56 kg/m   BMI: Body mass index is 30.56 kg/m.  Physical Exam Constitutional:      Appearance: She is well-developed.  HENT:     Head: Normocephalic and atraumatic.  Eyes:     General:        Right eye: No discharge.        Left eye: No discharge.  Neck:     Vascular: No JVD.  Cardiovascular:     Rate and Rhythm: Normal rate and regular rhythm.     Pulses:          Posterior tibial  pulses are 2+ on the right side and 2+ on the left side.     Heart sounds: Normal heart sounds, S1 normal and S2 normal. Heart sounds not distant. No midsystolic click and no opening snap. No murmur heard.   No friction rub.  Pulmonary:     Effort: Pulmonary effort is normal. No respiratory distress.     Breath sounds: Normal breath sounds. No decreased breath sounds, wheezing or rales.  Chest:     Chest wall: No tenderness.  Abdominal:     General: There is no distension.     Palpations: Abdomen is soft.  Musculoskeletal:     Cervical back: Normal range of motion.     Right lower leg: No edema.     Left lower leg: No edema.  Skin:    General: Skin is warm and dry.     Nails: There is no clubbing.  Neurological:     Mental Status: She is alert and oriented to person, place, and time.  Psychiatric:        Speech: Speech normal.        Behavior: Behavior normal.        Thought Content: Thought content normal.        Judgment: Judgment normal.    Wt Readings from Last 3 Encounters:  09/05/21 192 lb 12.8 oz (87.5 kg)  05/01/21 193 lb 5.5 oz (87.7 kg)  02/28/21 193 lb 6.4 oz (87.7 kg)     ASSESSMENT & PLAN:   CAD involving the native coronary arteries with stable angina: She is doing well without any symptoms concerning for progressive angina.  Continue aggressive secondary prevention and risk factor modification including aspirin and clopidogrel given  a prior left main stenting along with atorvastatin and carvedilol.  Aggressive risk factor modification including complete smoking cessation is encouraged.  No indication for further ischemic testing at this time.  HFrEF secondary to ICM: She appears euvolemic and well compensated.  She does frequently miss evening doses of carvedilol and Entresto.  The importance of taking medications on a daily basis was discussed.  We will need to see how she feels and what her blood pressure trend is with regular medication usage.  If, there is  adequate BP room with regular GDMT adherence, would recommend escalation of medical therapy with SGLT2 inhibitor followed by MRA.  However, this will require close follow-up and patient adherence.  She remains on low-dose furosemide.  CHF education.  Future orders for labs placed.  HTN: Blood pressure is poorly controlled at triage with a reading of 188/122.  This is likely in the setting of frequently missing medications and [facility].  She reports her blood pressure is well controlled at home.  She will continue to check her blood pressure over the next couple of weeks and call us with these readings.  For now, she was encouraged to take medications on a regular basis without missing doses with continued medical therapy as outlined above.  However, if we see her blood pressures continue to run on the high side, would recommend escalation of medical therapy.  HLD: LDL LDL 97.  Goal LDL less than 70.  Currently on atorvastatin 80 mg, reports adherence.  Not currently fasting.  Future orders placed for CMP and lipid panel.  If LDL remains above goal at that time would recommend transitioning atorvastatin to rosuvastatin and adding ezetimibe.  CKD stage III: Stable on last check.  Future lab orders placed.  Renal artery stenosis: Status post left renal artery stenting.  Follow-up with vascular surgery as directed.  Tobacco use: Complete cessation is encouraged.  She will try nicotine lozenges.  Obesity: Weight loss is encouraged through heart healthy diet and regular exercise.  Abnormal thyroid function: She remains on replacement therapy.  She was encouraged to discuss abnormal thyroid function with her PCP.    Disposition: F/u with Dr. Fletcher Anon or an APP in 6 months.   Medication Adjustments/Labs and Tests Ordered: Current medicines are reviewed at length with the patient today.  Concerns regarding medicines are outlined above. Medication changes, Labs and Tests ordered today are summarized  above and listed in the Patient Instructions accessible in Encounters.   Signed, [name], PA-C 09/05/2021 12:36 PM     [address], [place] [id number] [phone]

## 2021-09-04 ENCOUNTER — Encounter: Payer: Self-pay | Admitting: *Deleted

## 2021-09-04 DIAGNOSIS — Z006 Encounter for examination for normal comparison and control in clinical research program: Secondary | ICD-10-CM

## 2021-09-04 DIAGNOSIS — I5042 Chronic combined systolic (congestive) and diastolic (congestive) heart failure: Secondary | ICD-10-CM | POA: Insufficient documentation

## 2021-09-04 DIAGNOSIS — K852 Alcohol induced acute pancreatitis without necrosis or infection: Secondary | ICD-10-CM | POA: Insufficient documentation

## 2021-09-04 DIAGNOSIS — K859 Acute pancreatitis without necrosis or infection, unspecified: Secondary | ICD-10-CM | POA: Insufficient documentation

## 2021-09-04 DIAGNOSIS — M1990 Unspecified osteoarthritis, unspecified site: Secondary | ICD-10-CM | POA: Insufficient documentation

## 2021-09-04 NOTE — Research (Signed)
Spoke with Angelica Herrera about essence research states she would like more information. Emailed her a copy of the consent to review.

## 2021-09-05 ENCOUNTER — Encounter: Payer: Self-pay | Admitting: Physician Assistant

## 2021-09-05 ENCOUNTER — Ambulatory Visit (INDEPENDENT_AMBULATORY_CARE_PROVIDER_SITE_OTHER): Payer: Medicaid Other | Admitting: Physician Assistant

## 2021-09-05 VITALS — BP 188/122 | HR 69 | Ht 66.6 in | Wt 192.8 lb

## 2021-09-05 DIAGNOSIS — I701 Atherosclerosis of renal artery: Secondary | ICD-10-CM | POA: Diagnosis not present

## 2021-09-05 DIAGNOSIS — I10 Essential (primary) hypertension: Secondary | ICD-10-CM

## 2021-09-05 DIAGNOSIS — E785 Hyperlipidemia, unspecified: Secondary | ICD-10-CM

## 2021-09-05 DIAGNOSIS — N183 Chronic kidney disease, stage 3 unspecified: Secondary | ICD-10-CM | POA: Diagnosis not present

## 2021-09-05 DIAGNOSIS — I255 Ischemic cardiomyopathy: Secondary | ICD-10-CM

## 2021-09-05 DIAGNOSIS — I5022 Chronic systolic (congestive) heart failure: Secondary | ICD-10-CM | POA: Diagnosis not present

## 2021-09-05 DIAGNOSIS — I25118 Atherosclerotic heart disease of native coronary artery with other forms of angina pectoris: Secondary | ICD-10-CM

## 2021-09-05 DIAGNOSIS — Z720 Tobacco use: Secondary | ICD-10-CM

## 2021-09-05 NOTE — Patient Instructions (Addendum)
Please give [place] a call in 1-2 weeks with your blood pressure readings or send them to [place] using your My Chart.   Please monitor blood pressures and keep a log of your readings.   Make sure to check 2 hours after your medications.   AVOID these things for 30 minutes before checking your blood pressure: No Drinking caffeine. No Drinking alcohol. No Eating. No Smoking. No Exercising.  Five minutes before checking your blood pressure: Pee. Sit in a dining chair. Avoid sitting in a soft couch or armchair. Be quiet. Do not talk.   Medication Instructions:  No changes at this time.   *If you need a refill on your cardiac medications before your next appointment, please call your pharmacy*   Lab Work: CMET, Lipid panel, and CBC to be done at your convenience. These are fasting labs so nothing to eat or drink after midnight before except sip of water with your medications. Go to Medical [facility] Entrance at [facility] and check in at 1st desk on the right to check in (REGISTRATION)  Lab hours: Monday- Friday (7:30 am- 5:30 pm)     If you have labs (blood work) drawn today and your tests are completely normal, you will receive your results only by: [place] (if you have MyChart) OR A paper copy in the mail If you have any lab test that is abnormal or we need to change your treatment, we will call you to review the results.   Testing/Procedures: None    Follow-Up: At [facility], you and your health needs are our priority.  As part of our continuing mission to provide you with exceptional heart care, we have created designated Provider Care Teams.  These Care Teams include your primary Cardiologist (physician) and Advanced Practice Providers (APPs -  Physician Assistants and Nurse Practitioners) who all work together to provide you with the care you need, when you need it.  We recommend signing up for the patient portal called "MyChart".  Sign up information is provided on this After  Visit Summary.  MyChart is used to connect with patients for Virtual Visits (Telemedicine).  Patients are able to view lab/test results, encounter notes, upcoming appointments, etc.  Non-urgent messages can be sent to your provider as well.   To learn more about what you can do with MyChart, go to [email or web address].    Your next appointment:   6 month(s)  The format for your next appointment:   In Person  Provider:   Kathlyn Sacramento, MD or [name], PA-C        Important Information About Sugar

## 2021-09-11 ENCOUNTER — Other Ambulatory Visit: Payer: Self-pay | Admitting: Nurse Practitioner

## 2021-09-11 DIAGNOSIS — E038 Other specified hypothyroidism: Secondary | ICD-10-CM

## 2021-09-11 DIAGNOSIS — F411 Generalized anxiety disorder: Secondary | ICD-10-CM

## 2021-09-11 DIAGNOSIS — F3341 Major depressive disorder, recurrent, in partial remission: Secondary | ICD-10-CM

## 2021-09-12 NOTE — Telephone Encounter (Signed)
Requested Prescriptions  Pending Prescriptions Disp Refills  . PARoxetine (PAXIL) 40 MG tablet [Pharmacy Med Name: PAROXETINE HCL 40 MG TAB] 90 tablet 1    Sig: TAKE (1) TABLET BY MOUTH EVERY DAY     Psychiatry:  Antidepressants - SSRI Passed - 09/11/2021 12:53 PM      Passed - Completed PHQ-2 or PHQ-9 in the last 360 days      Passed - Valid encounter within last 6 months    Recent Outpatient Visits          3 months ago Acute gout due to other secondary cause involving toe, unspecified laterality   [place], DO   6 months ago Recurrent major depressive disorder [facility])   [facility] Bo Merino, FNP   7 months ago Recurrent major depressive disorder [facility])   [facility] Bo Merino, FNP   1 year ago Abdominal pain, unspecified abdominal location   [facility] Delsa Grana, PA-C   1 year ago Right upper quadrant abdominal pain   [place], MD      Future Appointments            In 6 months Dunn, Areta Haber, PA-C [facility]           . levothyroxine (SYNTHROID) 112 MCG tablet [Pharmacy Med Name: LEVOTHYROXINE SODIUM 112 MCG TAB] 90 tablet 1    Sig: TAKE ONE TABLET BY MOUTH EVERY DAY BEFORE BREAKFAST     Endocrinology:  Hypothyroid Agents Failed - 09/11/2021 12:53 PM      Failed - TSH in normal range and within 360 days    TSH  Date Value Ref Range Status  01/31/2021 27.39 (H) mIU/L Final    Comment:              Reference Range .           > or = 20 Years  0.40-4.50 .                Pregnancy Ranges           First trimester    0.26-2.66           Second trimester   0.55-2.73           Third trimester    0.43-2.91          Passed - Valid encounter within last 12 months    Recent Outpatient Visits          3 months ago Acute gout due to other secondary cause involving toe, unspecified  laterality   [place], DO   6 months ago Recurrent major depressive disorder [facility])   [facility] Bo Merino, FNP   7 months ago Recurrent major depressive disorder [facility])   [facility] Bo Merino, FNP   1 year ago Abdominal pain, unspecified abdominal location   [facility] Delsa Grana, PA-C   1 year ago Right upper quadrant abdominal pain   [place], MD      Future Appointments            In 6 months Dunn, Areta Haber, PA-C [facility], [facility]

## 2021-09-13 DIAGNOSIS — Z419 Encounter for procedure for purposes other than remedying health state, unspecified: Secondary | ICD-10-CM | POA: Diagnosis not present

## 2021-10-13 DIAGNOSIS — Z419 Encounter for procedure for purposes other than remedying health state, unspecified: Secondary | ICD-10-CM | POA: Diagnosis not present

## 2021-11-01 ENCOUNTER — Other Ambulatory Visit: Payer: Self-pay | Admitting: Family Medicine

## 2021-11-01 DIAGNOSIS — Z9889 Other specified postprocedural states: Secondary | ICD-10-CM

## 2021-11-01 DIAGNOSIS — F411 Generalized anxiety disorder: Secondary | ICD-10-CM

## 2021-11-01 DIAGNOSIS — G8929 Other chronic pain: Secondary | ICD-10-CM

## 2021-11-01 DIAGNOSIS — I701 Atherosclerosis of renal artery: Secondary | ICD-10-CM

## 2021-11-01 DIAGNOSIS — J329 Chronic sinusitis, unspecified: Secondary | ICD-10-CM

## 2021-11-01 DIAGNOSIS — I739 Peripheral vascular disease, unspecified: Secondary | ICD-10-CM

## 2021-11-01 DIAGNOSIS — F3341 Major depressive disorder, recurrent, in partial remission: Secondary | ICD-10-CM

## 2021-11-13 DIAGNOSIS — Z419 Encounter for procedure for purposes other than remedying health state, unspecified: Secondary | ICD-10-CM | POA: Diagnosis not present

## 2021-11-19 ENCOUNTER — Other Ambulatory Visit: Payer: Self-pay | Admitting: Cardiovascular Disease

## 2021-11-19 DIAGNOSIS — E785 Hyperlipidemia, unspecified: Secondary | ICD-10-CM

## 2021-11-19 DIAGNOSIS — I2510 Atherosclerotic heart disease of native coronary artery without angina pectoris: Secondary | ICD-10-CM

## 2021-12-14 DIAGNOSIS — Z419 Encounter for procedure for purposes other than remedying health state, unspecified: Secondary | ICD-10-CM | POA: Diagnosis not present

## 2021-12-20 ENCOUNTER — Other Ambulatory Visit: Payer: Self-pay | Admitting: Cardiovascular Disease

## 2021-12-20 ENCOUNTER — Other Ambulatory Visit: Payer: Self-pay | Admitting: Nurse Practitioner

## 2021-12-20 DIAGNOSIS — F411 Generalized anxiety disorder: Secondary | ICD-10-CM

## 2021-12-20 DIAGNOSIS — E038 Other specified hypothyroidism: Secondary | ICD-10-CM

## 2021-12-20 DIAGNOSIS — F3341 Major depressive disorder, recurrent, in partial remission: Secondary | ICD-10-CM

## 2021-12-21 NOTE — Telephone Encounter (Signed)
Last in-person 02/2021, video 05/2021. No follow-up scheduled.

## 2021-12-21 NOTE — Telephone Encounter (Signed)
Rx Levothyroxine and Paroxetine both filled 09/12/21 #90 1RF- too soon Over due visit and repeat lab Requested Prescriptions  Pending Prescriptions Disp Refills  . levothyroxine (SYNTHROID) 112 MCG tablet [Pharmacy Med Name: LEVOTHYROXINE SODIUM 112 MCG TAB] 90 tablet 1    Sig: TAKE ONE TABLET BY MOUTH EVERY DAY BEFORE BREAKFAST     Endocrinology:  Hypothyroid Agents Failed - 12/20/2021 12:10 PM      Failed - TSH in normal range and within 360 days    TSH  Date Value Ref Range Status  01/31/2021 27.39 (H) mIU/L Final    Comment:              Reference Range .           > or = 20 Years  0.40-4.50 .                Pregnancy Ranges           First trimester    0.26-2.66           Second trimester   0.55-2.73           Third trimester    0.43-2.91          Passed - Valid encounter within last 12 months    Recent Outpatient Visits          6 months ago Acute gout due to other secondary cause involving toe, unspecified laterality   [place], DO   9 months ago Recurrent major depressive disorder [facility])   [facility] Bo Merino, FNP   10 months ago Recurrent major depressive disorder [facility])   [facility] Bo Merino, FNP   1 year ago Abdominal pain, unspecified abdominal location   [facility] Delsa Grana, PA-C   1 year ago Right upper quadrant abdominal pain   [facility] Towanda Malkin, MD      Future Appointments            In 2 months Dunn, Areta Haber, PA-C [place]. [facility]           . busPIRone (BUSPAR) 5 MG tablet [Pharmacy Med Name: BUSPIRONE HCL 5 MG TAB] 30 tablet 0    Sig: TAKE (1) TABLET BY MOUTH TWICE DAILY     Psychiatry: Anxiolytics/Hypnotics - Non-controlled Passed - 12/20/2021 12:10 PM      Passed - Valid encounter within last 12 months    Recent Outpatient Visits          6  months ago Acute gout due to other secondary cause involving toe, unspecified laterality   [facility] Teodora Medici, DO   9 months ago Recurrent major depressive disorder [facility])   [facility] Bo Merino, FNP   10 months ago Recurrent major depressive disorder [facility])   [facility] Bo Merino, FNP   1 year ago Abdominal pain, unspecified abdominal location   [facility] Delsa Grana, PA-C   1 year ago Right upper quadrant abdominal pain   [facility] Towanda Malkin, MD      Future Appointments            In 2 months Dunn, Areta Haber, PA-C [place]. [place]           .  PARoxetine (PAXIL) 40 MG tablet [Pharmacy Med Name: PAROXETINE HCL 40 MG TAB] 90 tablet 1    Sig: TAKE (1) TABLET BY MOUTH EVERY DAY     Psychiatry:  Antidepressants - SSRI Failed - 12/20/2021 12:10 PM      Failed - Valid encounter within last 6 months    Recent Outpatient Visits          6 months ago Acute gout due to other secondary cause involving toe, unspecified laterality   [facility] Teodora Medici, DO   9 months ago Recurrent major depressive disorder [facility])   [facility] Bo Merino, FNP   10 months ago Recurrent major depressive disorder [facility])   [facility] Bo Merino, FNP   1 year ago Abdominal pain, unspecified abdominal location   [facility] Delsa Grana, PA-C   1 year ago Right upper quadrant abdominal pain   [facility] Towanda Malkin, MD      Future Appointments            In 2 months Dunn, Areta Haber, PA-C [place]. [facility]           Passed - Completed PHQ-2 or PHQ-9 in the last 360 days

## 2021-12-21 NOTE — Telephone Encounter (Signed)
Requested medication (s) are due for refill today - provider review   Requested medication (s) are on the active medication list -yes  Future visit scheduled -no  Last refill: 11/01/21 #30  Notes to clinic: Attempted to call patient to schedule follow up- left message to call office. Request sent for review   Requested Prescriptions  Pending Prescriptions Disp Refills   busPIRone (BUSPAR) 5 MG tablet [Pharmacy Med Name: BUSPIRONE HCL 5 MG TAB] 30 tablet 0    Sig: TAKE (1) TABLET BY MOUTH TWICE DAILY     Psychiatry: Anxiolytics/Hypnotics - Non-controlled Passed - 12/20/2021 12:10 PM      Passed - Valid encounter within last 12 months    Recent Outpatient Visits           6 months ago Acute gout due to other secondary cause involving toe, unspecified laterality   [facility] Teodora Medici, DO   9 months ago Recurrent major depressive disorder [facility])   [facility] Bo Merino, FNP   10 months ago Recurrent major depressive disorder [facility])   [facility] Bo [facility], FNP   1 year ago Abdominal pain, unspecified abdominal location   [facility] Delsa Grana, PA-C   1 year ago Right upper quadrant abdominal pain   [facility] Towanda Malkin, MD       Future Appointments             In 2 months Dunn, Areta Haber, PA-C [place]. [facility]            Refused Prescriptions Disp Refills   levothyroxine (SYNTHROID) 112 MCG tablet [Pharmacy Med Name: LEVOTHYROXINE SODIUM 112 MCG TAB] 90 tablet 1    Sig: TAKE ONE TABLET BY MOUTH EVERY DAY BEFORE BREAKFAST     Endocrinology:  Hypothyroid Agents Failed - 12/20/2021 12:10 PM      Failed - TSH in normal range and within 360 days    TSH  Date Value Ref Range Status  01/31/2021 27.39 (H) mIU/L Final    Comment:              Reference Range .           > or = 20 Years   0.40-4.50 .                Pregnancy Ranges           First trimester    0.26-2.66           Second trimester   0.55-2.73           Third trimester    0.43-2.91          Passed - Valid encounter within last 12 months    Recent Outpatient Visits           6 months ago Acute gout due to other secondary cause involving toe, unspecified laterality   [facility] Teodora Medici, DO   9 months ago Recurrent major depressive disorder [facility])   [facility] Bo Merino, FNP   10 months ago Recurrent major depressive disorder [facility])   [facility] Bo Merino, FNP   1 year ago Abdominal pain, unspecified abdominal location   [facility] Delsa Grana, PA-C   1 year ago Right upper quadrant abdominal pain   [place],  [place], MD       Future Appointments             In 2 months Dunn, Areta Haber, PA-C [place]. [facility]             PARoxetine (PAXIL) 40 MG tablet [Pharmacy Med Name: PAROXETINE HCL 40 MG TAB] 90 tablet 1    Sig: TAKE (1) TABLET BY MOUTH EVERY DAY     Psychiatry:  Antidepressants - SSRI Failed - 12/20/2021 12:10 PM      Failed - Valid encounter within last 6 months    Recent Outpatient Visits           6 months ago Acute gout due to other secondary cause involving toe, unspecified laterality   [facility] Teodora Medic[facility], DO   9 months ago Recurrent major depressive disorder [facility])   [facility] Bo Merino, FNP   10 months ago Recurrent major depressive disorder [facility])   [facility] Bo Merino, FNP   1 year ago Abdominal pain, unspecified abdominal location   [facility] Delsa Grana, PA-C   1 year ago Right upper quadrant abdominal pain   [facility] Towanda Malkin, MD        Future Appointments             In 2 months Dunn, Areta Haber, PA-C [place]. [facility]            Passed - Completed PHQ-2 or PHQ-9 in the last 360 days         Requested Prescriptions  Pending Prescriptions Disp Refills   busPIRone (BUSPAR) 5 MG tablet [Pharmacy Med Name: BUSPIRONE HCL 5 MG TAB] 30 tablet 0    Sig: TAKE (1) TABLET BY MOUTH TWICE DAILY     Psychiatry: Anxiolytics/Hypnotics - Non-controlled Passed - 12/20/2021 12:10 PM      Passed - Valid encounter within last 12 months    Recent Outpatient Visits           6 months ago Acute gout due to other secondary cause involving toe, unspecified laterality   [facility] Teodora Medic[facility], DO   9 months ago Recurrent major depressive disorder [facility])   [facility], FNP   10 months ago Recurrent major depressive disorder [facility])   [facility] Bo Merino, FNP   1 year ago Abdominal pain, unspecified abdominal location   [facility] Delsa Grana, PA-C   1 year ago Right upper quadrant abdominal pain   [facility] Towanda Malkin, MD       Future Appointments             In 2 months Dunn, Areta Haber, PA-C [place]. [facility]            Refused Prescriptions Disp Refills   levothyroxine (SYNTHROID) 112 MCG tablet [Pharmacy Med Name: LEVOTHYROXINE SODIUM 112 MCG TAB] 90 tablet 1    Sig: TAKE ONE TABLET BY MOUTH EVERY DAY BEFORE BREAKFAST     Endocrinology:  Hypothyroid Agents Failed - 12/20/2021 12:10 PM      Failed - TSH in normal range and within 360 days    TSH  Date Value Ref Range Status  01/31/2021 27.39 (H) mIU/L Final    Comment:              Reference Range .           > or = 20 Years  0.40-4.50 .                Pregnancy Ranges           First trimester    0.26-2.66           Second  trimester   0.55-2.73           Third trimester    0.43-2.91          Passed - Valid encounter within last 12 months    Recent Outpatient Visits           6 months ago Acute gout due to other secondary cause involving toe, unspecified laterality   [place], DO   9 months ago Recurrent major depressive disorder [facility])   [facility] Bo Merino, FNP   10 months ago Recurrent major depressive disorder [facility])   [facility] Bo Merino, FNP   1 year ago Abdominal pain, unspecified abdominal location   [facility] Delsa Grana, PA-C   1 year ago Right upper quadrant abdominal pain   [facility] Towanda Malkin, MD       Future Appointments             In 2 months Dunn, Areta Haber, PA-C [place]. [facility]             PARoxetine (PAXIL) 40 MG tablet [Pharmacy Med Name: PAROXETINE HCL 40 MG TAB] 90 tablet 1    Sig: TAKE (1) TABLET BY MOUTH EVERY DAY     Psychiatry:  Antidepressants - SSRI Failed - 12/20/2021 12:10 PM      Failed - Valid encounter within last 6 months    Recent Outpatient Visits           6 months ago Acute gout due to other secondary cause involving toe, unspecified laterality   [facility] Teodora Medic[facility], DO   9 months ago Recurrent major depressive disorder [facility])   [facility] Bo Merino, FNP   10 months ago Recurrent major depressive disorder [facility])   [facility] Bo Merino, FNP   1 year ago Abdominal pain, unspecified abdominal location   [facility] Delsa Grana, PA-C   1 year ago Right upper quadrant abdominal pain   [facility] Towanda Malkin, MD       Future Appointments             In 2 months Dunn, Areta Haber, PA-C [place]. [facility]            Passed - Completed PHQ-2 or PHQ-9 in the last 360 days

## 2021-12-24 ENCOUNTER — Other Ambulatory Visit: Payer: Self-pay | Admitting: Nurse Practitioner

## 2021-12-24 DIAGNOSIS — F3341 Major depressive disorder, recurrent, in partial remission: Secondary | ICD-10-CM

## 2021-12-24 DIAGNOSIS — F411 Generalized anxiety disorder: Secondary | ICD-10-CM

## 2021-12-25 NOTE — Telephone Encounter (Signed)
Requested Prescriptions  Pending Prescriptions Disp Refills  . busPIRone (BUSPAR) 5 MG tablet [Pharmacy Med Name: BUSPIRONE HCL 5 MG TAB] 30 tablet 0    Sig: TAKE (1) TABLET BY MOUTH TWICE DAILY     Psychiatry: Anxiolytics/Hypnotics - Non-controlled Passed - 12/24/2021  1:08 PM      Passed - Valid encounter within last 12 months    Recent Outpatient Visits          6 months ago Acute gout due to other secondary cause involving toe, unspecified laterality   [facility] Teodora Medic[facility], DO   10 months ago Recurrent major depressive disorder [facility])   [facility] Bo Merino, FNP   10 months ago Recurrent major depressive disorder [facility])   [facility] Bo Merino, FNP   1 year ago Abdominal pain, unspecified abdominal location   [facility] Delsa Grana, PA-C   1 year ago Right upper quadrant abdominal pain   [facility] Towanda Malkin, MD      Future Appointments            Tomorrow Delsa Grana, PA-C [facility] [place]   In 2 months Dunn, Areta Haber, PA-C [place]. [place]

## 2021-12-25 NOTE — Telephone Encounter (Signed)
Called pt and made appointment for 9/132023.

## 2021-12-26 ENCOUNTER — Ambulatory Visit: Payer: Medicaid Other | Admitting: Family Medicine

## 2021-12-27 ENCOUNTER — Ambulatory Visit (INDEPENDENT_AMBULATORY_CARE_PROVIDER_SITE_OTHER): Payer: Medicaid Other | Admitting: Family Medicine

## 2021-12-27 ENCOUNTER — Encounter: Payer: Self-pay | Admitting: Family Medicine

## 2021-12-27 VITALS — BP 170/102 | HR 67 | Temp 98.1°F | Resp 16 | Ht 67.0 in | Wt 190.1 lb

## 2021-12-27 DIAGNOSIS — I5022 Chronic systolic (congestive) heart failure: Secondary | ICD-10-CM

## 2021-12-27 DIAGNOSIS — E038 Other specified hypothyroidism: Secondary | ICD-10-CM

## 2021-12-27 DIAGNOSIS — I5042 Chronic combined systolic (congestive) and diastolic (congestive) heart failure: Secondary | ICD-10-CM

## 2021-12-27 DIAGNOSIS — I25118 Atherosclerotic heart disease of native coronary artery with other forms of angina pectoris: Secondary | ICD-10-CM

## 2021-12-27 DIAGNOSIS — F3341 Major depressive disorder, recurrent, in partial remission: Secondary | ICD-10-CM | POA: Diagnosis not present

## 2021-12-27 DIAGNOSIS — Z23 Encounter for immunization: Secondary | ICD-10-CM

## 2021-12-27 DIAGNOSIS — E785 Hyperlipidemia, unspecified: Secondary | ICD-10-CM

## 2021-12-27 DIAGNOSIS — N1832 Chronic kidney disease, stage 3b: Secondary | ICD-10-CM

## 2021-12-27 DIAGNOSIS — Z1211 Encounter for screening for malignant neoplasm of colon: Secondary | ICD-10-CM | POA: Diagnosis not present

## 2021-12-27 DIAGNOSIS — Z5982 Transportation insecurity: Secondary | ICD-10-CM | POA: Insufficient documentation

## 2021-12-27 DIAGNOSIS — Z5181 Encounter for therapeutic drug level monitoring: Secondary | ICD-10-CM

## 2021-12-27 DIAGNOSIS — M25562 Pain in left knee: Secondary | ICD-10-CM

## 2021-12-27 DIAGNOSIS — I10 Essential (primary) hypertension: Secondary | ICD-10-CM

## 2021-12-27 DIAGNOSIS — F411 Generalized anxiety disorder: Secondary | ICD-10-CM | POA: Diagnosis not present

## 2021-12-27 DIAGNOSIS — I255 Ischemic cardiomyopathy: Secondary | ICD-10-CM | POA: Diagnosis not present

## 2021-12-27 DIAGNOSIS — Z1159 Encounter for screening for other viral diseases: Secondary | ICD-10-CM | POA: Diagnosis not present

## 2021-12-27 DIAGNOSIS — Z1231 Encounter for screening mammogram for malignant neoplasm of breast: Secondary | ICD-10-CM

## 2021-12-27 MED ORDER — BUSPIRONE HCL 5 MG PO TABS: 5.0000 mg | ORAL_TABLET | Freq: Two times a day (BID) | ORAL | 2 refills | Status: DC | PRN

## 2021-12-27 NOTE — Assessment & Plan Note (Signed)
Per Dr. Arida/cardiology, patient on Entresto and Lasix, ran out of Lasix yesterday and has not been taking Entresto twice daily as prescribed her blood pressure is elevated, no lower extremity edema, dyspnea on exertion or orthopnea, she endorses gradual weight gain no acute increase in her weight or fluid retention

## 2021-12-27 NOTE — Progress Notes (Addendum)
Name: Angelica Herrera   MRN: [id number]    DOB: 1970-04-17   Date:12/27/2021       Progress Note  Chief Complaint  Patient presents with   Follow-up   Hypertension   Hypothyroidism   Anxiety   Mass    Lump behind left knee     Subjective:   Angelica Herrera is a 51 y.o. female, presents to clinic for routine f/up and med refill She often has barriers to her care including difficulty with transportation, severe anxiety which prohibits her from getting transportation or rides from certain people or organizations, she does tend to run out of medications because of this barrier Most recently she has been very compliant with her levothyroxine dose, but has been out of her statin and has been forgetting some of her blood pressure medications she often does not take the second dose and her blood pressure is currently elevated, it did not improve when rechecked today  Sometimes she can feel a little bit of pressure in her lower chest or abdomen when blood pressure is very high today she has had a headache but she does have carvedilol and Entresto at home and these medications are managed by cardiology  Hypertension:  Currently managed on Lasix 20 mg daily, Entresto and carvedilol twice daily, she took her last Lasix yesterday and forgets to take the second dose of her other medicines Pt reports poor med compliance and denies any SE.   Blood pressure today is uncontrolled BP Readings from Last 3 Encounters:  12/27/21 (!) 170/102  09/05/21 (!) 188/122  05/01/21 (!) 166/96   Pt denies CP, SOB, exertional sx, LE edema, palpitation, visual disturbances, lightheadedness, hypotension, syncope.   Hyperlipidemia: Currently treated with atorvastatin 80 mg, pt reports poor med compliance - inconsistent - runs out of meds often, otherwise when she has it she takes it -she was instructed to do follow-up fasting lab work for the cardiologist, she is here this afternoon and unfortunately has not  fasted or done a lot of the recommendations of the cardiologist but she is been unable to get rides to appointment otherwise Last Lipids: Lab Results  Component Value Date   CHOL 204 (H) 01/31/2021   HDL 66 01/31/2021   [place] 97 01/31/2021   TRIG 288 (H) 01/31/2021   CHOLHDL 3.1 01/31/2021   - Reports: Chest pain, shortness of breath, myalgias, claudication  Hypothyroidism: History: She has a history of variable TSHs and not many med dose changes she is on 112 mcg levothyroxine she has been taking it consistently for the past couple months She endorses increased weight despite not changing her diet or activity level, depressed mood  Most recent results are below; we will be repeating labs today. Lab Results  Component Value Date   TSH 27.39 (H) 01/31/2021    Her anxiety and mood are worse/uncontrolled right now Tearful in the exam room, having severe anxiety and what she describes as panic attacks often with various different triggers, she is not established with a psychiatrist and has not done therapy again because it is difficult to get to appointments She is on BuSpar 5 mg up to twice a day which is helpful but she would like to try higher dose she also inquires about increasing her Paxil dose which is currently at 40 mg once daily    12/27/2021    2:09 PM 06/01/2021    8:39 AM 02/28/2021    1:12 PM  Depression screen PHQ 2/9  Decreased Interest 3 0 2  Down, Depressed, Hopeless 3 0 2  PHQ - 2 Score 6 0 4  Altered sleeping 3 0 1  Tired, decreased energy 3 0 3  Change in appetite 3 0 0  Feeling bad or failure about yourself  3 0 0  Trouble concentrating 0 0 3  Moving slowly or fidgety/restless 0 0 0  Suicidal thoughts 0 0 0  PHQ-9 Score 18 0 11  Difficult doing work/chores Very difficult Not difficult at all Somewhat difficult    Left knee/leg pain -a little over a week ago she woke up with feeling some discomfort behind her left knee it felt like a charley horse or like  she pulled or stretched something she then had swelling to her anterior knee and it was painful to walk on it has improved gradually over the last week but she noticed a bulge to the back of her knee which is currently not tender she reports having history of arthritis and intermittent pain to both of her knees, she has not had any redness or swelling to her legs, no shortness of breath     Current Outpatient Medications:    aspirin 81 MG tablet, Take 1 tablet (81 mg total) by mouth daily., Disp: 30 tablet, Rfl:    atorvastatin (LIPITOR) 80 MG tablet, TAKE ONE TABLET BY MOUTH AT BEDTIME., Disp: 90 tablet, Rfl: 0   busPIRone (BUSPAR) 5 MG tablet, TAKE (1) TABLET BY MOUTH TWICE DAILY, Disp: 90 tablet, Rfl: 0   carvedilol (COREG) 12.5 MG tablet, Take 1 tablet (12.5 mg total) by mouth 2 (two) times daily with a meal., Disp: 180 tablet, Rfl: 2   cetirizine (ZYRTEC) 10 MG tablet, TAKE (1) TABLET BY MOUTH DAILY AT BEDTIME, Disp: 30 tablet, Rfl: 11   clopidogrel (PLAVIX) 75 MG tablet, TAKE (1) TABLET BY MOUTH EVERY DAY, Disp: 90 tablet, Rfl: 0   colchicine 0.6 MG tablet, Take 1 tablet (0.6 mg total) by mouth daily as needed (gout)., Disp: 15 tablet, Rfl: 0   ENTRESTO 24-26 MG, TAKE ONE TABLET BY MOUTH TWICE DAILY., Disp: 60 tablet, Rfl: 5   furosemide (LASIX) 20 MG tablet, TAKE (1) TABLET BY MOUTH EVERY DAY, Disp: 90 tablet, Rfl: 0   furosemide (LASIX) 20 MG tablet, TAKE (1) TABLET BY MOUTH EVERY DAY, Disp: 90 tablet, Rfl: 0   gabapentin (NEURONTIN) 300 MG capsule, TAKE (1) CAPSULE BY MOUTH TWICE DAILY, Disp: 120 capsule, Rfl: 1   levothyroxine (SYNTHROID) 112 MCG tablet, TAKE ONE TABLET BY MOUTH EVERY DAY BEFORE BREAKFAST, Disp: 90 tablet, Rfl: 1   nitroGLYCERIN (NITROSTAT) 0.4 MG SL tablet, Place 1 tablet (0.4 mg total) under the tongue every 5 (five) minutes x 3 doses as needed for chest pain., Disp: 25 tablet, Rfl: 3   PARoxetine (PAXIL) 40 MG tablet, TAKE (1) TABLET BY MOUTH EVERY DAY, Disp: 90  tablet, Rfl: 1   sacubitril-valsartan (ENTRESTO) 24-26 MG, Take 1 tablet by mouth 2 (two) times daily., Disp: 180 tablet, Rfl: 2  Current Facility-Administered Medications:    mometasone-formoterol (DULERA) 200-5 MCG/ACT inhaler 2 puff, 2 puff, Inhalation, BID, Delsa Grana, PA-C  Patient Active Problem List   Diagnosis Date Noted   Arthritis 09/04/2021   Chronic combined systolic (congestive) and diastolic (congestive) heart failure ([place]) 09/04/2021   Pancreatitis 09/04/2021   Stage 3b chronic kidney disease ([place]) 02/18/2020   Anxiety with depression 02/18/2020   Tobacco dependence 02/18/2020   Rhinosinusitis 09/02/2019   Chronic obstructive pulmonary disease (  [place]) 09/02/2019   Squamous cell carcinoma of back 04/20/2019   Accelerated hypertension 11/13/2018   Chest pain 11/12/2018   S/P primary angioplasty with coronary stent 03/04/2018   Depression, recurrent ([place]) 26/37/8588   Chronic systolic heart failure ([place]) 11/21/2017   Controlled substance agreement signed 09/07/2015   Coronary artery disease involving native coronary artery    Allergic rhinitis due to pollen 06/28/2015   GAD (generalized anxiety disorder) 03/17/2015   Degenerative disc disease, cervical 03/17/2015   Hyperlipidemia    Obesity    CKD (chronic kidney disease), stage IV (HCC)    Ischemic cardiomyopathy    NSVT (nonsustained ventricular tachycardia) (HCC)    Hypothyroidism    History of acute anterior wall MI 02/12/2012   Essential hypertension, malignant 02/12/2012   Tobacco abuse 02/12/2012    Past Surgical History:  Procedure Laterality Date   ABDOMINAL HYSTERECTOMY     CARDIAC CATHETERIZATION  2013   [facility] s/p stent   CARDIAC CATHETERIZATION Left 08/03/2015   Procedure: Left Heart Cath and Coronary Angiography;  Surgeon: Wellington Hampshire, MD;  Location: [facility];  Service: Cardiovascular;  Laterality: Left;   DILATION AND CURETTAGE OF UTERUS     LEFT HEART CATHETERIZATION WITH  CORONARY ANGIOGRAM N/A 02/11/2012   Procedure: LEFT HEART CATHETERIZATION WITH CORONARY ANGIOGRAM;  Surgeon: [name], MD;  Location: [facility];  Service: Cardiovascular;  Laterality: N/A;   RENAL INTERVENTION N/A 11/17/2018   Procedure: RENAL INTERVENTION;  Surgeon: Katha Cabal, MD;  Location: [facility];  Service: Cardiovascular;  Laterality: N/A;    Family History  Problem Relation Age of Onset   Hypertension Father    Thyroid disease Father    AAA (abdominal aortic aneurysm) Father    Hypertension Mother    Hyperlipidemia Mother    Diabetes Mother    Kidney disease Mother    Hypertension Sister    Hypertension Sister    Supraventricular tachycardia Sister    Hypertension Brother    Hypertension Brother    Hypertension Daughter    Cancer Paternal Grandfather        lung    Social History   Tobacco Use   Smoking status: Every Day    Packs/day: 0.25    Years: 30.00    Total pack years: 7.50    Types: Cigarettes   Smokeless tobacco: Never  Vaping Use   Vaping Use: Never used  Substance Use Topics   Alcohol use: Not Currently    Comment: Says she previously drank socially - never a heavy drinker.  No etoh since 08/2018.   Drug use: No    Types: Marijuana     No Known Allergies  Health Maintenance  Topic Date Due   Hepatitis C Screening  Never done   COLONOSCOPY (Pts 45-27yr Insurance coverage will need to be confirmed)  Never done   HIV Screening  Completed   HPV VACCINES  Aged Out   INFLUENZA VACCINE  Discontinued   MAMMOGRAM  Discontinued   PAP SMEAR-Modifier  Discontinued   TETANUS/TDAP  Discontinued   COVID-19 Vaccine  Discontinued   Zoster Vaccines- Shingrix  Discontinued    Chart Review Today: I personally reviewed active problem list, medication list, allergies, family history, social history, health maintenance, notes from last encounter, lab results, imaging with the patient/caregiver today.   Review of Systems   Constitutional: Negative.   HENT: Negative.    Eyes: Negative.   Respiratory: Negative.    Cardiovascular: Negative.  Gastrointestinal: Negative.   Endocrine: Negative.   Genitourinary: Negative.   Musculoskeletal: Negative.   Skin: Negative.   Allergic/Immunologic: Negative.   Neurological: Negative.   Hematological: Negative.   Psychiatric/Behavioral: Negative.    All other systems reviewed and are negative.    Objective:   Vitals:   12/27/21 1411 12/27/21 1419  BP: (!) 170/102 (!) 170/102  Pulse: 67   Resp: 16   Temp: 98.1 F (36.7 C)   TempSrc: Oral   SpO2: 93%   Weight: 190 lb 1.6 oz (86.2 kg)   Height: '5\' 7"'$  (1.702 m)     Body mass index is 29.77 kg/m.  Physical Exam Vitals and nursing note reviewed.  Constitutional:      General: She is not in acute distress.    Appearance: She is obese. She is not ill-appearing, toxic-appearing or diaphoretic.  HENT:     Head: Normocephalic and atraumatic.     Right Ear: External ear normal.     Left Ear: External ear normal.  Cardiovascular:     Rate and Rhythm: Normal rate and regular rhythm.     Pulses: Normal pulses.     Heart sounds: Normal heart sounds.  Pulmonary:     Effort: Pulmonary effort is normal. No respiratory distress.     Breath sounds: Normal breath sounds.  Musculoskeletal:     Left knee: No swelling, deformity, erythema or bony tenderness. Normal range of motion. No tenderness.     Right lower leg: Normal. No swelling. No edema.     Left lower leg: Normal. No swelling. No edema.     Comments: Symmetrical calf circumference to bilateral legs - ~34 cm  Skin:    Coloration: Skin is not jaundiced or pale.  Neurological:     Mental Status: She is alert. Mental status is at baseline.     Gait: Gait normal.  Psychiatric:        Attention and Perception: Attention normal.        Mood and Affect: Mood is anxious and depressed. Affect is tearful.        Speech: Speech normal.        Behavior:  Behavior is cooperative.        Judgment: Judgment normal.         Assessment & Plan:   Problem List Items Addressed This Visit       Cardiovascular and Mediastinum   Chronic systolic heart failure (HCC) (Chronic)    Per Dr. Arida/cardiology, patient on Entresto and Lasix, ran out of Lasix yesterday and has not been taking Entresto twice daily as prescribed her blood pressure is elevated, no lower extremity edema, dyspnea on exertion or orthopnea, she endorses gradual weight gain no acute increase in her weight or fluid retention      Essential hypertension, malignant - Primary    Blood pressure currently uncontrolled, patient has not taken all of her medications as prescribed for the past 3 to 4 days, she endorses having well-controlled blood pressure when she does take her medicines correctly, she just got all of her meds refilled from cardiology, she will take as prescribed and we will do a close follow-up with checking her home blood pressure readings Patient may want to consider consulting with the chronic care management or care coordination pharmacist to help patient get consistent refills and avoid running out of meds and allowing conditions to become uncontrolled      Ischemic cardiomyopathy   Relevant Orders   Lipid Panel  w/reflex Direct LDL   Coronary artery disease involving native coronary artery   Chronic combined systolic (congestive) and diastolic (congestive) heart failure (HCC)   Relevant Orders   COMPLETE METABOLIC PANEL WITH GFR     Endocrine   Hypothyroidism    No recent levothyroxine dose changes that she has frequently abnormal labs due to running out of medication she endorses taking it consistently for the past couple months we will recheck TSH today she is having some symptoms consistent with chemical hypothyroid, if TSH is high or even in the high end of normal range we can increase her dose slightly and monitor closely, she is mostly concerned with  severely depressed mood, weight gain without diet or lifestyle changes      Relevant Orders   TSH     Genitourinary   Stage 3b chronic kidney disease ([place])    Monitoring, will recheck her labs today she was given careful instructions by the cardiologist regarding how to prepare for her ordered lab work but she was unable to do that she was instructed to do morning fasting labs and to avoid soda      Relevant Orders   COMPLETE METABOLIC PANEL WITH GFR     Other   Hyperlipidemia    Unfortunately off of statin, will likely need to get patient her medications again and have her take consistently for at least 3 months before rechecking      Relevant Orders   COMPLETE METABOLIC PANEL WITH GFR   Lipid Panel w/reflex Direct LDL   GAD (generalized anxiety disorder)    She is experiencing extreme anxiety symptoms which seem to be in general, some are social anxiety type symptoms she is having some panic attacks with various triggers I encouraged her strongly to establish with psychiatry and a therapist to work on other treatments We will screen her TSH, adjust medications, but I explained that she will most likely have the best control of her symptoms with a trained mental health specialist  Increase buspar dose PRN After checking TSH - may increase paxil - reviewed max dosing with pt ~60 mg       Relevant Medications   busPIRone (BUSPAR) 5 MG tablet   Other Relevant Orders   Ambulatory referral to Psychiatry   Lack of access to transportation   Other Visit Diagnoses     Recurrent major depressive disorder ([place])       Relevant Medications   busPIRone (BUSPAR) 5 MG tablet   Other Relevant Orders   Ambulatory referral to Psychiatry   Need for hepatitis C screening test       Then today   Relevant Orders   Hepatitis C antibody   Need for influenza vaccination       Patient refused   Need for shingles vaccine       Screening for colon cancer       Referral for colonoscopy was  entered but patient may be able to do Cologuard from home   Relevant Orders   Cologuard   Encounter for screening mammogram for malignant neoplasm of breast       Patient declines mammogram at this time having difficulty getting to any appointments or tests   Acute pain of left knee       onset upon waking with swelling to both front and back of knee, pain with weight bearing, improved gradually over the past week, no injury   Encounter for medication monitoring  Relevant Orders   CBC with Differential/Platelet   COMPLETE METABOLIC PANEL WITH GFR   TSH   Lipid Panel w/reflex Direct LDL        Return for 2-3 week virtual or phone f/up on BP at home with cuff monitoring.   Delsa Grana, PA-C 12/27/21 2:37 PM

## 2021-12-27 NOTE — Assessment & Plan Note (Signed)
She is experiencing extreme anxiety symptoms which seem to be in general, some are social anxiety type symptoms she is having some panic attacks with various triggers I encouraged her strongly to establish with psychiatry and a therapist to work on other treatments We will screen her TSH, adjust medications, but I explained that she will most likely have the best control of her symptoms with a trained mental health specialist  Increase buspar dose PRN After checking TSH - may increase paxil - reviewed max dosing with pt ~60 mg

## 2021-12-27 NOTE — Assessment & Plan Note (Signed)
Unfortunately off of statin, will likely need to get patient her medications again and have her take consistently for at least 3 months before rechecking

## 2021-12-27 NOTE — Assessment & Plan Note (Signed)
Monitoring, will recheck her labs today she was given careful instructions by the cardiologist regarding how to prepare for her ordered lab work but she was unable to do that she was instructed to do morning fasting labs and to avoid soda

## 2021-12-27 NOTE — Assessment & Plan Note (Signed)
No recent levothyroxine dose changes that she has frequently abnormal labs due to running out of medication she endorses taking it consistently for the past couple months we will recheck TSH today she is having some symptoms consistent with chemical hypothyroid, if TSH is high or even in the high end of normal range we can increase her dose slightly and monitor closely, she is mostly concerned with severely depressed mood, weight gain without diet or lifestyle changes

## 2021-12-27 NOTE — Assessment & Plan Note (Signed)
Blood pressure currently uncontrolled, patient has not taken all of her medications as prescribed for the past 3 to 4 days, she endorses having well-controlled blood pressure when she does take her medicines correctly, she just got all of her meds refilled from cardiology, she will take as prescribed and we will do a close follow-up with checking her home blood pressure readings Patient may want to consider consulting with the chronic care management or care coordination pharmacist to help patient get consistent refills and avoid running out of meds and allowing conditions to become uncontrolled

## 2021-12-28 ENCOUNTER — Other Ambulatory Visit: Payer: Self-pay | Admitting: Family Medicine

## 2021-12-28 DIAGNOSIS — E038 Other specified hypothyroidism: Secondary | ICD-10-CM

## 2021-12-28 LAB — COMPLETE METABOLIC PANEL WITH GFR
AG Ratio: 1.6 (calc) (ref 1.0–2.5)
ALT: 8 U/L (ref 6–29)
AST: 13 U/L (ref 10–35)
Albumin: 3.9 g/dL (ref 3.6–5.1)
Alkaline phosphatase (APISO): 94 U/L (ref 37–153)
BUN/Creatinine Ratio: 10 (calc) (ref 6–22)
BUN: 12 mg/dL (ref 7–25)
CO2: 25 mmol/L (ref 20–32)
Calcium: 9.3 mg/dL (ref 8.6–10.4)
Chloride: 104 mmol/L (ref 98–110)
Creat: 1.19 mg/dL — ABNORMAL HIGH (ref 0.50–1.03)
Globulin: 2.5 g/dL (calc) (ref 1.9–3.7)
Glucose, Bld: 91 mg/dL (ref 65–99)
Potassium: 3.3 mmol/L — ABNORMAL LOW (ref 3.5–5.3)
Sodium: 142 mmol/L (ref 135–146)
Total Bilirubin: 1 mg/dL (ref 0.2–1.2)
Total Protein: 6.4 g/dL (ref 6.1–8.1)
eGFR: 55 mL/min/{1.73_m2} — ABNORMAL LOW (ref >=60)

## 2021-12-28 LAB — CBC WITH DIFFERENTIAL/PLATELET
Absolute Monocytes: 465 cells/uL (ref 200–950)
Basophils Absolute: 38 cells/uL (ref 0–200)
Basophils Relative: 0.5 %
Eosinophils Absolute: 120 cells/uL (ref 15–500)
Eosinophils Relative: 1.6 %
HCT: 45.6 % — ABNORMAL HIGH (ref 35.0–45.0)
Hemoglobin: 16.2 g/dL — ABNORMAL HIGH (ref 11.7–15.5)
Lymphs Abs: 1598 cells/uL (ref 850–3900)
MCH: 32.3 pg (ref 27.0–33.0)
MCHC: 35.5 g/dL (ref 32.0–36.0)
MCV: 91 fL (ref 80.0–100.0)
MPV: 10 fL (ref 7.5–12.5)
Monocytes Relative: 6.2 %
Neutro Abs: 5280 cells/uL (ref 1500–7800)
Neutrophils Relative %: 70.4 %
Platelets: 238 10*3/uL (ref 140–400)
RBC: 5.01 10*6/uL (ref 3.80–5.10)
RDW: 13.5 % (ref 11.0–15.0)
Total Lymphocyte: 21.3 %
WBC: 7.5 10*3/uL (ref 3.8–10.8)

## 2021-12-28 LAB — LIPID PANEL W/REFLEX DIRECT LDL
Cholesterol: 109 mg/dL (ref <200)
HDL: 52 mg/dL (ref >=50)
LDL Cholesterol (Calc): 36 mg/dL (calc)
Non-HDL Cholesterol (Calc): 57 mg/dL (calc) (ref <130)
Total CHOL/HDL Ratio: 2.1 (calc) (ref <5.0)
Triglycerides: 128 mg/dL (ref <150)

## 2021-12-28 LAB — HEPATITIS C ANTIBODY: Hepatitis C Ab: NONREACTIVE

## 2021-12-28 LAB — TSH: TSH: 10.03 mIU/L — ABNORMAL HIGH

## 2021-12-28 MED ORDER — LEVOTHYROXINE SODIUM 125 MCG PO TABS: 125.0000 ug | ORAL_TABLET | Freq: Every day | ORAL | 1 refills | Status: DC

## 2022-01-10 ENCOUNTER — Encounter: Payer: Self-pay | Admitting: Family Medicine

## 2022-01-10 ENCOUNTER — Ambulatory Visit (INDEPENDENT_AMBULATORY_CARE_PROVIDER_SITE_OTHER): Payer: Medicaid Other | Admitting: Family Medicine

## 2022-01-10 VITALS — BP 132/84 | HR 68 | Temp 98.0°F | Resp 16 | Ht 67.0 in | Wt 194.2 lb

## 2022-01-10 DIAGNOSIS — I10 Essential (primary) hypertension: Secondary | ICD-10-CM

## 2022-01-10 DIAGNOSIS — E038 Other specified hypothyroidism: Secondary | ICD-10-CM | POA: Diagnosis not present

## 2022-01-10 NOTE — Patient Instructions (Signed)
Thyroid med should be 125 mcg daily right now Once you figure out when you start that dose, we will want to recheck labs about 6 weeks after

## 2022-01-10 NOTE — Progress Notes (Signed)
Name: Angelica Herrera   MRN: [id number]    DOB: [birth year]   Date:01/10/2022       Progress Note  Chief Complaint  Patient presents with   Follow-up   Hypertension     Subjective:   Angelica Herrera is a 51 y.o. female, presents to clinic for f/up on BP  She was able to get back on all her medications, she feels symptomatically better, her blood pressure is at goal today and has been well controlled at home. BP Readings from Last 3 Encounters:  01/10/22 132/84  12/27/21 (!) 170/102  09/05/21 (!) 188/122  She does have follow-up with her cardiologist soon her lab work was reviewed with her today and previously sent to her cardiologist   She complains of continued weight gain -reviewed her weights below Wt Readings from Last 5 Encounters:  01/10/22 194 lb 3.2 oz (88.1 kg)  12/27/21 190 lb 1.6 oz (86.2 kg)  09/05/21 192 lb 12.8 oz (87.5 kg)  05/01/21 193 lb 5.5 oz (87.7 kg)  02/28/21 193 lb 6.4 oz (87.7 kg)   BMI Readings from Last 5 Encounters:  01/10/22 30.42 kg/m  12/27/21 29.77 kg/m  09/05/21 30.56 kg/m  05/01/21 31.21 kg/m  02/28/21 31.22 kg/m   Her TSH was elevated and her medications were changed however patient was unaware of this Reviewed with her her dose changes and her lab work Lab Results  Component Value Date   TSH 10.03 (H) 12/27/2021  She was taking levothyroxine 112 mcg daily and this was changed to 125    Current Outpatient Medications:    aspirin 81 MG tablet, Take 1 tablet (81 mg total) by mouth daily., Disp: 30 tablet, Rfl:    atorvastatin (LIPITOR) 80 MG tablet, TAKE ONE TABLET BY MOUTH AT BEDTIME., Disp: 90 tablet, Rfl: 0   busPIRone (BUSPAR) 5 MG tablet, Take 1-3 tablets (5-15 mg total) by mouth 2 (two) times daily as needed (anxiety, irritability)., Disp: 120 tablet, Rfl: 2   carvedilol (COREG) 12.5 MG tablet, Take 1 tablet (12.5 mg total) by mouth 2 (two) times daily with a meal., Disp: 180 tablet, Rfl: 2   cetirizine (ZYRTEC) 10  MG tablet, TAKE (1) TABLET BY MOUTH DAILY AT BEDTIME, Disp: 30 tablet, Rfl: 11   clopidogrel (PLAVIX) 75 MG tablet, TAKE (1) TABLET BY MOUTH EVERY DAY, Disp: 90 tablet, Rfl: 0   colchicine 0.6 MG tablet, Take 1 tablet (0.6 mg total) by mouth daily as needed (gout)., Disp: 15 tablet, Rfl: 0   ENTRESTO 24-26 MG, TAKE ONE TABLET BY MOUTH TWICE DAILY., Disp: 60 tablet, Rfl: 5   furosemide (LASIX) 20 MG tablet, TAKE (1) TABLET BY MOUTH EVERY DAY, Disp: 90 tablet, Rfl: 0   gabapentin (NEURONTIN) 300 MG capsule, TAKE (1) CAPSULE BY MOUTH TWICE DAILY, Disp: 120 capsule, Rfl: 1   levothyroxine (SYNTHROID) 125 MCG tablet, Take 1 tablet (125 mcg total) by mouth daily before breakfast., Disp: 90 tablet, Rfl: 1   nitroGLYCERIN (NITROSTAT) 0.4 MG SL tablet, Place 1 tablet (0.4 mg total) under the tongue every 5 (five) minutes x 3 doses as needed for chest pain., Disp: 25 tablet, Rfl: 3   PARoxetine (PAXIL) 40 MG tablet, TAKE (1) TABLET BY MOUTH EVERY DAY, Disp: 90 tablet, Rfl: 1   sacubitril-valsartan (ENTRESTO) 24-26 MG, Take 1 tablet by mouth 2 (two) times daily., Disp: 180 tablet, Rfl: 2  Current Facility-Administered Medications:    mometasone-formoterol (DULERA) 200-5 MCG/ACT inhaler 2 puff, 2 puff, Inhalation,  BID, Delsa Grana, PA-C  Patient Active Problem List   Diagnosis Date Noted   Lack of access to transportation 12/27/2021   Arthritis 09/04/2021   Chronic combined systolic (congestive) and diastolic (congestive) heart failure ([place]) 09/04/2021   Pancreatitis 09/04/2021   Stage 3b chronic kidney disease ([place]) 02/18/2020   Anxiety with depression 02/18/2020   Tobacco dependence 02/18/2020   Rhinosinusitis 09/02/2019   Chronic obstructive pulmonary disease ([place]) 09/02/2019   Squamous cell carcinoma of back 04/20/2019   Accelerated hypertension 11/13/2018   Chest pain 11/12/2018   S/P primary angioplasty with coronary stent 03/04/2018   Depression, recurrent ([place]) 12/02/2017   Chronic  systolic heart failure ([place]) 11/21/2017   Controlled substance agreement signed 09/07/2015   Coronary artery disease involving native coronary artery    Allergic rhinitis due to pollen 06/28/2015   GAD (generalized anxiety disorder) 03/17/2015   Degenerative disc disease, cervical 03/17/2015   Hyperlipidemia    Obesity    CKD (chronic kidney disease), stage IV ([place])    Ischemic cardiomyopathy    Hypothyroidism    History of acute anterior wall MI 02/12/2012   Essential hypertension, malignant 02/12/2012   Tobacco abuse 02/12/2012    Past Surgical History:  Procedure Laterality Date   ABDOMINAL HYSTERECTOMY     CARDIAC CATHETERIZATION  2013   [facility] s/p stent   CARDIAC CATHETERIZATION Left 08/03/2015   Procedure: Left Heart Cath and Coronary Angiography;  Surgeon: Wellington Hampshire, MD;  Location: [place] [facility];  Service: Cardiovascular;  Laterality: Left;   DILATION AND CURETTAGE OF UTERUS     LEFT HEART CATHETERIZATION WITH CORONARY ANGIOGRAM N/A 02/11/2012   Procedure: LEFT HEART CATHETERIZATION WITH CORONARY ANGIOGRAM;  Surgeon: [name], MD;  Location: [facility] C[facility];  Service: Cardiovascular;  Laterality: N/A;   RENAL INTERVENTION N/A 11/17/2018   Procedure: RENAL INTERVENTION;  Surgeon: Katha Cabal, MD;  Location: [place] [facility];  Service: Cardiovascular;  Laterality: N/A;    Family History  Problem Relation Age of Onset   Hypertension Father    Thyroid disease Father    AAA (abdominal aortic aneurysm) Father    Hypertension Mother    Hyperlipidemia Mother    Diabetes Mother    Kidney disease Mother    Hypertension Sister    Hypertension Sister    Supraventricular tachycardia Sister    Hypertension Brother    Hypertension Brother    Hypertension Daughter    Cancer Paternal Grandfather        lung    Social History   Tobacco Use   Smoking status: Every Day    Packs/day: 0.25    Years: 30.00    Total pack years: 7.50    Types: Cigarettes    Smokeless tobacco: Never  Vaping Use   Vaping Use: Never used  Substance Use Topics   Alcohol use: Not Currently    Comment: Says she previously drank socially - never a heavy drinker.  No etoh since 08/2018.   Drug use: No    Types: Marijuana     No Known Allergies  Health Maintenance  Topic Date Due   Fecal DNA (Cologuard)  Never done   Hepatitis C Screening  Completed   HIV Screening  Completed   HPV VACCINES  Aged Out   INFLUENZA VACCINE  Discontinued   MAMMOGRAM  Discontinued   PAP SMEAR-Modifier  Discontinued   TETANUS/TDAP  Discontinued   COVID-19 Vaccine  Discontinued   Zoster Vaccines- Shingrix  Discontinued  Chart Review Today: I personally reviewed active problem list, medication list, allergies, family history, social history, health maintenance, notes from last encounter, lab results, imaging with the patient/caregiver today.   Review of Systems  Constitutional: Negative.   HENT: Negative.    Eyes: Negative.   Respiratory: Negative.    Cardiovascular: Negative.   Gastrointestinal: Negative.   Endocrine: Negative.   Genitourinary: Negative.   Musculoskeletal: Negative.   Skin: Negative.   Allergic/Immunologic: Negative.   Neurological: Negative.   Hematological: Negative.   Psychiatric/Behavioral: Negative.    All other systems reviewed and are negative.    Objective:   Vitals:   01/10/22 1400  BP: 132/84  Pulse: 68  Resp: 16  Temp: 98 F (36.7 C)  TempSrc: Oral  SpO2: 93%  Weight: 194 lb 3.2 oz (88.1 kg)  Height: '5\' 7"'$  (1.702 m)    Body mass index is 30.42 kg/m.  Physical Exam Vitals and nursing note reviewed.  Constitutional:      General: She is not in acute distress.    Appearance: She is well-developed. She is obese. She is not ill-appearing, toxic-appearing or diaphoretic.  HENT:     Head: Normocephalic and atraumatic.     Nose: Nose normal.  Eyes:     General:        Right eye: No discharge.        Left eye: No  discharge.     Conjunctiva/sclera: Conjunctivae normal.  Neck:     Trachea: No tracheal deviation.  Cardiovascular:     Rate and Rhythm: Normal rate and regular rhythm.     Pulses: Normal pulses.     Heart sounds: Normal heart sounds.  Pulmonary:     Effort: Pulmonary effort is normal. No respiratory distress.     Breath sounds: No stridor.  Musculoskeletal:        General: Normal range of motion.  Skin:    General: Skin is warm and dry.     Findings: No rash.  Neurological:     Mental Status: She is alert. Mental status is at baseline.     Motor: No abnormal muscle tone.     Coordination: Coordination normal.  Psychiatric:        Mood and Affect: Mood normal.        Behavior: Behavior normal.         Assessment & Plan:     ICD-10-CM   1. Essential hypertension, malignant  I10    BP at goal today, here for recheck, last OV was not taking all her meds, now compliant and BP well controlled, sx improved    2. Other specified hypothyroidism  E03.8 TSH    CANCELED: TSH   Pt to confirm meds at home are 125 mcg, f/up labs in 6 weeks after dose adjustment and f/up to discuss labs and sx      F/up in ~6 weeks for thyroid - virtual ok Labs given to pt to check TSH at [place] or here at [facility], PA-C 01/10/22 2:17 PM

## 2022-01-13 DIAGNOSIS — Z419 Encounter for procedure for purposes other than remedying health state, unspecified: Secondary | ICD-10-CM | POA: Diagnosis not present

## 2022-02-06 ENCOUNTER — Other Ambulatory Visit: Payer: Self-pay | Admitting: Nurse Practitioner

## 2022-02-06 DIAGNOSIS — Z9889 Other specified postprocedural states: Secondary | ICD-10-CM

## 2022-02-06 DIAGNOSIS — I701 Atherosclerosis of renal artery: Secondary | ICD-10-CM

## 2022-02-06 DIAGNOSIS — I739 Peripheral vascular disease, unspecified: Secondary | ICD-10-CM

## 2022-02-07 NOTE — Telephone Encounter (Signed)
Requested Prescriptions  Pending Prescriptions Disp Refills  . clopidogrel (PLAVIX) 75 MG tablet [Pharmacy Med Name: CLOPIDOGREL BISULFATE 75 MG TAB] 90 tablet 0    Sig: TAKE (1) TABLET BY MOUTH EVERY DAY     Hematology: Antiplatelets - clopidogrel Failed - 02/06/2022 10:21 AM      Failed - HCT in normal range and within 180 days    HCT  Date Value Ref Range Status  12/27/2021 45.6 (H) 35.0 - 45.0 % Final   Hematocrit  Date Value Ref Range Status  02/27/2018 51.5 (H) 34.0 - 46.6 % Final         Failed - HGB in normal range and within 180 days    Hemoglobin  Date Value Ref Range Status  12/27/2021 16.2 (H) 11.7 - 15.5 g/dL Final  02/27/2018 17.6 (H) 11.1 - 15.9 g/dL Final         Failed - Cr in normal range and within 360 days    Creat  Date Value Ref Range Status  12/27/2021 1.19 (H) 0.50 - 1.03 mg/dL Final         Passed - PLT in normal range and within 180 days    Platelets  Date Value Ref Range Status  12/27/2021 238 140 - 400 Thousand/uL Final  02/27/2018 315 150 - 450 x10E3/uL Final         Passed - Valid encounter within last 6 months    Recent Outpatient Visits          4 weeks ago Essential hypertension, malignant   [facility] Delsa Grana, PA-C   1 month ago Essential hypertension, malignant   [facility] [place], Kristeen Miss, PA-C   8 months ago Acute gout due to other secondary cause involving toe, unspecified laterality   [facility] Teodora Medici, DO   11 months ago Recurrent major depressive disorder [facility])   [facility] Bo Merino, FNP   1 year ago Recurrent major depressive disorder [facility])   [facility] Bo Merino, FNP      Future Appointments            In 2 weeks Delsa Grana, PA-C [facility], [place]   In 1 month [place], Areta Haber, PA-C [place]. [place]

## 2022-02-13 DIAGNOSIS — Z419 Encounter for procedure for purposes other than remedying health state, unspecified: Secondary | ICD-10-CM | POA: Diagnosis not present

## 2022-02-21 ENCOUNTER — Ambulatory Visit: Payer: Medicaid Other | Admitting: Family Medicine

## 2022-02-21 DIAGNOSIS — E038 Other specified hypothyroidism: Secondary | ICD-10-CM

## 2022-03-11 NOTE — Progress Notes (Deleted)
Cardiology Office Note    Date:  03/11/2022   ID:  FEVEN ALDERFER, DOB 12/14/1970, MRN [id number]  PCP:  Delsa Grana, PA-C  Cardiologist:  Kathlyn Sacramento, MD  Electrophysiologist:  None   Chief Complaint: Follow-up  History of Present Illness:   Angelica Herrera is a 51 y.o. female with history of CAD with anterolateral ST elevation MI in 01/2012 with PTCA/DES to the left main at that time as detailed below complicated by hemodynamic instability and VT, HFrEF secondary to ICM, left renal artery stenosis status post left renal artery stenting in 11/2018 by vascular surgery, CKD stage III, HLD, hypothyroidism, depression, and tobacco use who presents for follow-up of her CAD and cardiomyopathy.   She was admitted to the hospital in 01/2012 with an anterolateral STEMI. She underwent emergent cardiac catheterization which showed occlusion of the left main coronary artery. There was significant difficulty engaging the left main, though she ultimately underwent successful PCI/DES. She also had PTCA done to the LAD due to embolization from the left main. Post procedure she had significant hemodynamic instablity with VT and hypotension. She was noted to have severe MR initially, however subsequent echocardiogram showed an EF of 45% with no significant MR. She was lost to follow up from 2013-2017. Upon re-establishing with our group in 2017, she continued to smoke and had been off her medications. In 2017, she had atypical chest pain with a Lexiscan MPI showing prior anterior infarct with mild peri-infarct ischemia and an EF of 33%. She underwent repeat cardiac cath at that time which showed a patent left main stent with moderate mid LAD stenosis and an occluded D2 with faint collaterals. The RCA was very large with mild mid stenosis. EF was 35-40% with anterior wall hypokinesis. Continued medical therapy was advised. She underwent repeat nuclear stress testing in 02/2017 for recurrent chest pain  that showed a similar finding of prior anterior infarct with mild peri-infarct ischemia.  Echo at that time demonstrated an EF of 30 to 35%, diffuse hypokinesis with severe hypokinesis/possible akinesis of the anterior and lateral walls, grade 1 diastolic dysfunction, mild to moderate mitral regurgitation, normal RV systolic function, and mildly elevated PASP.  Most recent echo from 02/2019 demonstrated an EF of 35 to 40%, anterior and septal wall hypokinesis, grade 2 diastolic dysfunction, mildly dilated LV internal cavity size, normal RV systolic function with mildly enlarged ventricular cavity, mildly to moderately dilated left atrium, mildly dilated right atrium, moderately elevated PASP estimated at 51.2 mmHg.   She was seen in the office in 02/2021 and had been out of carvedilol, Entresto, and furosemide.  She was without symptoms of angina or decompensation.  She continued to smoke one fourth a pack of cigarettes per day.  She had gained significant weight over the preceding 6 months.  GDMT was reinitiated.  She was last seen in the office in 08/2021 and was without symptoms of angina or decompensation.  She reported frequently missing her p.m. doses of Entresto and carvedilol.  She continues to smoke 1/2 pack/day.  She reported her blood pressure was well-controlled at home and indicated her blood pressure was typically elevated in medical practices secondary to anxiety.  It was felt her elevated BP was also in the setting of frequently missing her medications.  ***   Labs independently reviewed: 12/2021 - TC 109, TG 128, HDL 52, LDL 36, TSH 10.03, BUN 12, serum creatinine 1.19, potassium 3.3, albumin 3.9, AST/ALT normal, Hgb 16.2, PLT 238  Past Medical  History:  Diagnosis Date   Accelerated hypertension 11/13/2018   Allergic rhinitis due to pollen 06/28/2015   Anxiety with depression 02/18/2020   Arthritis    gout   Chest pain 11/12/2018   Chronic combined systolic (congestive) and diastolic  (congestive) heart failure ([place])    a. 02/2017 Echo: EF 30-35%, diff HK, sev HK/poss AK of ant and lat walls. Gr1 DD. Mild to mod MR. Nl RV fxn. Mod TR.   Chronic obstructive pulmonary disease ([place]) 09/02/2019   tx with trelegy and SABA   Chronic systolic heart failure ([place]) 11/21/2017   CKD (chronic kidney disease), stage IV ([place])    Controlled substance agreement signed 09/07/2015   Coronary artery disease involving native coronary artery    Degenerative disc disease, cervical 03/17/2015   Depression, recurrent ([place]) 12/02/2017   Essential hypertension, malignant 02/12/2012   GAD (generalized anxiety disorder) 03/17/2015   History of acute anterior wall MI 02/12/2012   Hyperlipidemia    Hypothyroidism    Ischemic cardiomyopathy    a. 02/2012 Echo: EF 40-45%, ant/lat HK, Gr1 DD, Mild MR, PASP 50mHg; b. 02/2017 Echo: EF 30-35%.   NSVT (nonsustained ventricular tachycardia) ([place]    a. 02/2012 post-MI   Obesity    Pancreatitis    Rhinosinusitis 09/02/2019   S/P primary angioplasty with coronary stent 03/04/2018   Dual antiplatelet therapy INDEFINITELY per cardiology note Nov 2019   Squamous cell carcinoma of back 04/20/2019   referrred to sugery for f/up excision, derm not available for several month, lesion with rapid change cannot wait, still see derm for skin survey etc   Stage 3b chronic kidney disease ([place] 02/18/2020   Tobacco abuse    Tobacco dependence 02/18/2020    Past Surgical History:  Procedure Laterality Date   ABDOMINAL HYSTERECTOMY     CARDIAC CATHETERIZATION  2013   [facility] s/p stent   CARDIAC CATHETERIZATION Left 08/03/2015   Procedure: Left Heart Cath and Coronary Angiography;  Surgeon: MWellington Hampshire MD;  [facility];  Service: Cardiovascular;  Laterality: Left;   DILATION AND CURETTAGE OF UTERUS     LEFT HEART CATHETERIZATION WITH CORONARY ANGIOGRAM N/A 02/11/2012   Procedure: LEFT HEART CATHETERIZATION WITH CORONARY ANGIOGRAM;  Surgeon: MSherren Mocha MD;  [facility];  Service: Cardiovascular;  Laterality: N/A;   RENAL INTERVENTION N/A 11/17/2018   Procedure: RENAL INTERVENTION;  Surgeon: SKatha Cabal MD;  [facility];  Service: Cardiovascular;  Laterality: N/A;    Current Medications: No outpatient medications have been marked as taking for the 03/15/22 encounter (Appointment) with DRise Mu PA-C.   Current Facility-Administered Medications for the 03/15/22 encounter (Appointment) with DRise Mu PA-C  Medication   mometasone-formoterol (DULERA) 200-5 MCG/ACT inhaler 2 puff    Allergies:   Patient has no known allergies.   Social History   Socioeconomic History   Marital status: Legally Separated    Spouse name: Not on file   Number of children: Not on file   Years of education: Not on file   Highest education level: Not on file  Occupational History   Not on file  Tobacco Use   Smoking status: Every Day    Packs/day: 0.25    Years: 30.00    Total pack years: 7.50    Types: Cigarettes   Smokeless tobacco: Never  Vaping Use   Vaping Use: Never used  Substance and Sexual Activity   Alcohol use: Not Currently  Comment: Says she previously drank socially - never a heavy drinker.  No etoh since 08/2018.   Drug use: No    Types: Marijuana   Sexual activity: Not on file  Other Topics Concern   Not on file  Social History Narrative   Lives in [place] w/ her dtr.  Does not routinely exercise.  Disabled/doesn't work.   Social Determinants of Health   Financial Resource Strain: Not on file  Food Insecurity: Not on file  Transportation Needs: Not on file  Physical Activity: Not on file  Stress: Not on file  Social Connections: Not on file     Family History:  The patient's family history includes AAA (abdominal aortic aneurysm) in her father; Cancer in her paternal grandfather; Diabetes in her mother; Hyperlipidemia in her mother; Hypertension in her brother, brother,  daughter, father, mother, sister, and sister; Kidney disease in her mother; Supraventricular tachycardia in her sister; Thyroid disease in her father.  ROS:   ROS   EKGs/Labs/Other Studies Reviewed:    Studies reviewed were summarized above. The additional studies were reviewed today:  2D echo 02/19/2019:  1. Left ventricular ejection fraction, by visual estimation, is 35 to  40%. The left ventricle has moderately decreased function. There is no  left ventricular hypertrophy. Anterior and Septal wall hypokinesis.   2. Left ventricular diastolic parameters are consistent with Grade II  diastolic dysfunction (pseudonormalization).   3. Mildly dilated left ventricular internal cavity size.   4. Global right ventricle has normal systolic function.The right  ventricular size is mildly enlarged. No increase in right ventricular wall  thickness.   5. Left atrial size was mild-moderately dilated.   6. Right atrial size was mildly dilated.   7. Moderately elevated pulmonary artery systolic pressure.   8. The tricuspid regurgitant velocity is 2.80 m/s, and with an assumed  right atrial pressure of 20 mmHg, the estimated right ventricular systolic  pressure is moderately elevated at 51.2 mmHg.   9. Small pericardial effusion. __________   2D echo 02/21/2017: - Left ventricle: The cavity size was normal. Systolic function was    moderately to severely reduced. The estimated ejection fraction    was in the range of 30% to 35%. Diffuse hypokinesis with severe    hypokinesis /possible akinesis of the anterior and lateral walls.    Doppler parameters are consistent with abnormal left ventricular    relaxation (grade 1 diastolic dysfunction).  - Mitral valve: There was mild to moderate regurgitation.  - Left atrium: The atrium was normal in size.  - Right ventricle: Systolic function was normal.  - Tricuspid valve: There was moderate regurgitation.  - Pulmonary arteries: Systolic pressure was  mildly elevated. __________   Carlton Adam MPI 02/14/2017: There was no ST segment deviation noted during stress. No T wave inversion was noted during stress. Defect 1: There is a medium defect of severe severity present in the basal anterior, basal anterolateral, mid anterior and mid anterolateral location. This is an intermediate risk study. The left ventricular ejection fraction is moderately decreased (30-44%). Findings consistent with prior myocardial infarction with mild peri-infarct ischemia. __________   2D echo 10/06/2015: - Left ventricle: The cavity size was normal. Systolic function was    moderately reduced. The estimated ejection fraction was in the    range of 35% to 40%. Hypokinesis of the anterior myocardium.    Hypokinesis of the anteroseptal myocardium. Doppler parameters    are consistent with abnormal left ventricular relaxation (grade 1  diastolic dysfunction).  - Mitral valve: Calcified annulus. There was mild regurgitation.  - Left atrium: The atrium was normal in size.  - Right ventricle: Systolic function was normal.  - Pulmonary arteries: Systolic pressure was within the normal    range. ___________   LHC 08/03/2015: Ost LM to LM lesion, 10% stenosed. The lesion was previously treated with a stent (unknown type). Mid LAD lesion, 60% stenosed. Mid RCA lesion, 30% stenosed. There is moderate left ventricular systolic dysfunction.   1. Widely patent ostial left main stent with moderate mid LAD stenosis and occluded second diagonal with faint collaterals. The right coronary artery is very large with mild mid stenosis. 2. Moderately reduced LV systolic function with an ejection fraction of 35-40% with significant anterior wall hypokinesis. Mildly elevated left ventricular end-diastolic pressure. 3. Significant radial artery spasm which required increased amounts of sedation.   Recommendations: Continue medical therapy for coronary artery disease and chronic  systolic heart failure. We will need to transition her from clonidine to an ACE inhibitor or ARB with close monitoring of renal function. __________   Carlton Adam MPI 07/28/2015: Pharmacological myocardial perfusion imaging study with attenuation corrected images suggesting mild anterior wall ischemia, mid to apical region Nonattenuation corrected images also with mild mid to apical anterior wall ischemia Patient attempted exercise on bruce protocol though unable to reach target heart rate, changed to lexiscan Anteroseptal wall hypokinesis noted, EF estimated at 33% No EKG changes concerning for ischemia at peak stress or in recovery. Moderate risk scan with mild ischemia as above __________   2D echo 02/12/2012: - Left ventricle: The cavity size was normal. Wall thickness    was increased in a pattern of mild LVH. Systolic function    was mildly to moderately reduced. The estimated ejection    fraction was in the range of 40% to 45%. Hypokinesis of    the entireanterior and lateral myocardium. Doppler    parameters are consistent with abnormal left ventricular    relaxation (grade 1 diastolic dysfunction).  - Mitral valve: Mild regurgitation.  - Pulmonary arteries: PA peak pressure: 61m Hg (S).   EKG:  EKG is ordered today.  The EKG ordered today demonstrates ***  Recent Labs: 12/27/2021: ALT 8; BUN 12; Creat 1.19; Hemoglobin 16.2; Platelets 238; Potassium 3.3; Sodium 142; TSH 10.03  Recent Lipid Panel    Component Value Date/Time   CHOL 109 12/27/2021 1506   CHOL 136 02/27/2018 1207   TRIG 128 12/27/2021 1506   HDL 52 12/27/2021 1506   HDL 53 02/27/2018 1207   CHOLHDL 2.1 12/27/2021 1506   VLDL 15 11/13/2018 0628   LDLCALC 36 12/27/2021 1506    PHYSICAL EXAM:    VS:  There were no vitals taken for this visit.  BMI: There is no height or weight on file to calculate BMI.  Physical Exam  Wt Readings from Last 3 Encounters:  01/10/22 194 lb 3.2 oz (88.1 kg)  12/27/21 190 lb  1.6 oz (86.2 kg)  09/05/21 192 lb 12.8 oz (87.5 kg)     ASSESSMENT & PLAN:   CAD involving the native coronary arteries with stable***angina:  HFrEF secondary to ICM:  HTN: Blood pressure  HLD: LDL 36 in 12/2021 with normal AST/ALT at that time.  CKD stage III:  Renal artery stenosis:  Tobacco use:  Obesity:  Abnormal thyroid function:   {Are you ordering a CV Procedure (e.g. stress test, cath, DCCV, TEE, etc)?   Press F2        :  [id number]}     Disposition: F/u with Dr. Fletcher Anon or an APP in ***.   Medication Adjustments/Labs and Tests Ordered: Current medicines are reviewed at length with the patient today.  Concerns regarding medicines are outlined above. Medication changes, Labs and Tests ordered today are summarized above and listed in the Patient Instructions accessible in Encounters.   Signed, Venia Carbon, NP  [facility] [place], [place] [address] [phone]

## 2022-03-15 ENCOUNTER — Encounter: Payer: Self-pay | Admitting: Physician Assistant

## 2022-03-15 ENCOUNTER — Ambulatory Visit: Payer: Medicaid Other | Attending: Physician Assistant | Admitting: Physician Assistant

## 2022-03-15 DIAGNOSIS — Z419 Encounter for procedure for purposes other than remedying health state, unspecified: Secondary | ICD-10-CM | POA: Diagnosis not present

## 2022-03-27 ENCOUNTER — Other Ambulatory Visit: Payer: Self-pay | Admitting: Cardiovascular Disease

## 2022-03-27 ENCOUNTER — Other Ambulatory Visit: Payer: Self-pay | Admitting: Nurse Practitioner

## 2022-03-27 DIAGNOSIS — F411 Generalized anxiety disorder: Secondary | ICD-10-CM

## 2022-03-27 DIAGNOSIS — I2510 Atherosclerotic heart disease of native coronary artery without angina pectoris: Secondary | ICD-10-CM

## 2022-03-27 DIAGNOSIS — E785 Hyperlipidemia, unspecified: Secondary | ICD-10-CM

## 2022-03-27 DIAGNOSIS — F3341 Major depressive disorder, recurrent, in partial remission: Secondary | ICD-10-CM

## 2022-03-27 NOTE — Telephone Encounter (Signed)
Requested Prescriptions  Pending Prescriptions Disp Refills   PARoxetine (PAXIL) 40 MG tablet [Pharmacy Med Name: PAROXETINE HCL 40 MG TAB] 90 tablet 1    Sig: TAKE (1) TABLET BY MOUTH EVERY DAY     Psychiatry:  Antidepressants - SSRI Passed - 03/27/2022 10:38 AM      Passed - Completed PHQ-2 or PHQ-9 in the last 360 days      Passed - Valid encounter within last 6 months    Recent Outpatient Visits           2 months ago Essential hypertension, malignant   [facility] Delsa Grana, PA-C   3 months ago Essential hypertension, malignant   [facility] Delsa Grana, PA-C   9 months ago Acute gout due to other secondary cause involving toe, unspecified laterality   [facility] Teodora Medici, DO   1 year ago Recurrent major depressive disorder [facility])   [place], FNP   1 year ago Recurrent major depressive disorder [facility])   [facility] Bo Merino, FNP

## 2022-04-15 DIAGNOSIS — Z419 Encounter for procedure for purposes other than remedying health state, unspecified: Secondary | ICD-10-CM | POA: Diagnosis not present

## 2022-04-17 ENCOUNTER — Other Ambulatory Visit: Payer: Self-pay | Admitting: Nurse Practitioner

## 2022-04-17 DIAGNOSIS — G8929 Other chronic pain: Secondary | ICD-10-CM

## 2022-04-18 NOTE — Telephone Encounter (Signed)
Requested Prescriptions  Pending Prescriptions Disp Refills   gabapentin (NEURONTIN) 300 MG capsule [Pharmacy Med Name: GABAPENTIN 300 MG CAP] 180 capsule 0    Sig: TAKE (1) CAPSULE BY MOUTH TWICE DAILY     Neurology: Anticonvulsants - gabapentin Failed - 04/17/2022 12:19 PM      Failed - Cr in normal range and within 360 days    Creat  Date Value Ref Range Status  12/27/2021 1.19 (H) 0.50 - 1.03 mg/dL Final         Passed - Completed PHQ-2 or PHQ-9 in the last 360 days      Passed - Valid encounter within last 12 months    Recent Outpatient Visits           3 months ago Essential hypertension, malignant   [facility] Delsa Grana, PA-C   3 months ago Essential hypertension, malignant   [facility] Delsa Grana, PA-C   10 months ago Acute gout due to other secondary cause involving toe, unspecified laterality   [facility] Teodora Medici, DO   1 year ago Recurrent major depressive disorder [facility])   [place], FNP   1 year ago Recurrent major depressive disorder [facility])   [facility] Bo Merino, FNP

## 2022-05-13 ENCOUNTER — Other Ambulatory Visit: Payer: Self-pay | Admitting: Cardiovascular Disease

## 2022-05-13 ENCOUNTER — Other Ambulatory Visit: Payer: Self-pay | Admitting: Nurse Practitioner

## 2022-05-13 DIAGNOSIS — I739 Peripheral vascular disease, unspecified: Secondary | ICD-10-CM

## 2022-05-13 DIAGNOSIS — I2510 Atherosclerotic heart disease of native coronary artery without angina pectoris: Secondary | ICD-10-CM

## 2022-05-13 DIAGNOSIS — E785 Hyperlipidemia, unspecified: Secondary | ICD-10-CM

## 2022-05-13 DIAGNOSIS — I701 Atherosclerosis of renal artery: Secondary | ICD-10-CM

## 2022-05-13 DIAGNOSIS — Z9889 Other specified postprocedural states: Secondary | ICD-10-CM

## 2022-05-16 ENCOUNTER — Telehealth: Payer: Self-pay | Admitting: Cardiovascular Disease

## 2022-05-16 ENCOUNTER — Emergency Department: Payer: Medicaid Other

## 2022-05-16 ENCOUNTER — Other Ambulatory Visit: Payer: Self-pay

## 2022-05-16 ENCOUNTER — Emergency Department
Admission: EM | Admit: 2022-05-16 | Discharge: 2022-05-16 | Disposition: A | Discharge status: Home / Self Care | Payer: Medicaid Other | Attending: Emergency Medicine | Admitting: Emergency Medicine

## 2022-05-16 DIAGNOSIS — I130 Hypertensive heart and chronic kidney disease with heart failure and stage 1 through stage 4 chronic kidney disease, or unspecified chronic kidney disease: Secondary | ICD-10-CM | POA: Insufficient documentation

## 2022-05-16 DIAGNOSIS — M25562 Pain in left knee: Secondary | ICD-10-CM

## 2022-05-16 DIAGNOSIS — Z419 Encounter for procedure for purposes other than remedying health state, unspecified: Secondary | ICD-10-CM | POA: Diagnosis not present

## 2022-05-16 DIAGNOSIS — M25462 Effusion, left knee: Secondary | ICD-10-CM | POA: Diagnosis not present

## 2022-05-16 DIAGNOSIS — N189 Chronic kidney disease, unspecified: Secondary | ICD-10-CM | POA: Insufficient documentation

## 2022-05-16 DIAGNOSIS — I10 Essential (primary) hypertension: Secondary | ICD-10-CM

## 2022-05-16 DIAGNOSIS — G8929 Other chronic pain: Secondary | ICD-10-CM | POA: Diagnosis not present

## 2022-05-16 DIAGNOSIS — I509 Heart failure, unspecified: Secondary | ICD-10-CM | POA: Diagnosis not present

## 2022-05-16 MED ORDER — OXYCODONE-ACETAMINOPHEN 5-325 MG PO TABS
1.0000 | ORAL_TABLET | Freq: Once | ORAL | Status: AC
  Administered 2022-05-16: 1 via ORAL
  Filled 2022-05-16: qty 1

## 2022-05-16 MED ORDER — OXYCODONE-ACETAMINOPHEN 7.5-325 MG PO TABS: 1.0000 | ORAL_TABLET | Freq: Four times a day (QID) | ORAL | 0 refills | Status: AC | PRN

## 2022-05-16 MED ORDER — SACUBITRIL-VALSARTAN 24-26 MG PO TABS
1.0000 | ORAL_TABLET | Freq: Once | ORAL | Status: AC
  Administered 2022-05-16: 1 via ORAL
  Filled 2022-05-16: qty 1

## 2022-05-16 MED ORDER — CARVEDILOL 6.25 MG PO TABS
12.5000 mg | ORAL_TABLET | Freq: Once | ORAL | Status: AC
  Administered 2022-05-16: 12.5 mg via ORAL
  Filled 2022-05-16: qty 2

## 2022-05-16 MED ORDER — OXYCODONE HCL 5 MG PO TABS
5.0000 mg | ORAL_TABLET | Freq: Once | ORAL | Status: AC
  Administered 2022-05-16: 5 mg via ORAL
  Filled 2022-05-16: qty 1

## 2022-05-16 NOTE — ED Notes (Signed)
X-ray at bedside

## 2022-05-16 NOTE — ED Triage Notes (Signed)
Pt states left knee pain that started 2 days ago. Pt states no known trauma. Pt states pain radiates to the hip.  Pt states pain with walking. Pt states high blood pressure due to being out of blood pressure medication for the last several days as well.

## 2022-05-16 NOTE — ED Provider Notes (Signed)
[facility] Provider Note    Event Date/Time   First MD Initiated Contact with Patient 05/16/22 1118     (approximate)   History   Knee Pain (left)   HPI  Angelica Herrera is a 52 y.o. female with past medical history of CHF, hypertension, CKD, arthritis and as listed in EMR presents to the emergency department for treatment and evaluation of nontraumatic left knee pain. Pain and swelling worsening over the past 24 hours. Pain radiates above and below the knee. No relief with tylenol. She is also out of BP medications but has refills ready at the pharmacy.      Physical Exam   Triage Vital Signs: ED Triage Vitals  Enc Vitals Group     BP 05/16/22 1110 (!) 184/132     Pulse Rate 05/16/22 1110 94     Resp 05/16/22 1110 20     Temp 05/16/22 1110 (!) 97.5 F (36.4 C)     Temp Source 05/16/22 1110 Oral     SpO2 05/16/22 1110 94 %     Weight 05/16/22 1111 180 lb (81.6 kg)     Height 05/16/22 1111 '5\' 6"'$  (1.676 m)     Head Circumference --      Peak Flow --      Pain Score 05/16/22 1111 10     Pain Loc --      Pain Edu? --      Excl. in [place]? --     Most recent vital signs: Vitals:   05/16/22 1300 05/16/22 1400  BP: (!) 176/121 (!) 180/125  Pulse: 81 79  Resp:    Temp:    SpO2: 93% 94%    General: Awake, no distress.  CV:  Good peripheral perfusion.  Resp:  Normal effort.  Abd:  No distention.  Other:  Left knee edematous without erythema or increase in skin temperature. Able to demonstrate active flexion and extension although painful. Compartments are soft.   ED Results / Procedures / Treatments   Labs (all labs ordered are listed, but only abnormal results are displayed) Labs Reviewed - No data to display   EKG  Not indicated   RADIOLOGY  Image and radiology report reviewed and interpreted by me. Radiology report consistent with the same.  X-ray of the left knee without acute bony abnormality.  Moderate joint effusion  noted.  PROCEDURES:  Critical Care performed: No  Procedures   MEDICATIONS ORDERED IN ED:  Medications  carvedilol (COREG) tablet 12.5 mg (12.5 mg Oral Given 05/16/22 1202)  sacubitril-valsartan (ENTRESTO) 24-26 mg per tablet (1 tablet Oral Given 05/16/22 1217)  oxyCODONE-acetaminophen (PERCOCET/ROXICET) 5-325 MG per tablet 1 tablet (1 tablet Oral Given 05/16/22 1202)  oxyCODONE (Oxy IR/ROXICODONE) immediate release tablet 5 mg (5 mg Oral Given 05/16/22 1419)     IMPRESSION / MDM / ASSESSMENT AND PLAN / ED COURSE   I have reviewed the triage note.  Differential diagnosis includes, but is not limited to, arthritis, joint effusion, septic joint, gout  Patient's presentation is most consistent with acute complicated illness / injury requiring diagnostic workup.  52 year old female presenting to the emergency department for treatment and evaluation of nontraumatic left knee pain and swelling.  She is also noted to be hypertensive but has not had her medications for the past couple of days.  She is asymptomatic regarding hypertension.  X-ray of the knee shows a moderate joint effusion.  The knee is not hot or red and she is  still able to demonstrate flexion and extension although painful.  I do not suspect septic joint.  She is currently on Plavix therefore drawing fluid off for symptomatic treatment will not be performed in the emergency department today.  She will be placed in a hinged knee brace.  She is already using a walker at home and was encouraged to continue to do so.  She was encouraged to rest, ice, and elevate often throughout the day.  Follow-up information for orthopedics will be provided.  Short course of pain medication submitted to her pharmacy after reviewing her controlled substance report.  In regards to her blood pressure, she does have refills waiting her at the pharmacy.  She was encouraged to stop on her way home and pick those up.  She is to follow-up with her primary care  provider for management of hypertension.  If any symptom changes or worsens and she is unable to be evaluated by primary care or orthopedics she is to return to the emergency department.      FINAL CLINICAL IMPRESSION(S) / ED DIAGNOSES   Final diagnoses:  Acute pain of left knee  Hypertension, unspecified type     Rx / DC Orders   ED Discharge Orders          Ordered    oxyCODONE-acetaminophen (PERCOCET) 7.5-325 MG tablet  Every 6 hours PRN        05/16/22 1357             Note:  This document was prepared using Dragon voice recognition software and may include unintentional dictation errors.   Victorino Dike, FNP 05/16/22 1638    Vanessa Abernathy, MD 05/16/22 [phone]

## 2022-05-16 NOTE — Telephone Encounter (Signed)
Pt c/o BP issue: STAT if pt c/o blurred vision, one-sided weakness or slurred speech  1. What are your last 5 BP readings?  183/100's while in the hospital today  2. Are you having any other symptoms (ex. Dizziness, headache, blurred vision, passed out)?  headache  3. What is your BP issue?   Patient states her BP has been very elevated. She mentions that the highest it has gotten to was 209/176.   Pt c/o medication issue:  1. Name of Medication:  (ENTRESTO) 24-26 MG  2. How are you currently taking this medication (dosage and times per day)?   3. Are you having a reaction (difficulty breathing--STAT)?   4. What is your medication issue?   Patient states [facility] is requesting a PA for this medication.

## 2022-05-17 ENCOUNTER — Telehealth: Payer: Self-pay

## 2022-05-17 NOTE — Telephone Encounter (Signed)
Thank you for the FYI.  Recommend patient take medications daily as directed.  This will help with her blood pressure readings.

## 2022-05-17 NOTE — Telephone Encounter (Signed)
Returned the call to the patient. She stated that she has been having elevated blood pressure readings which has been an ongoing situation. The patient stated that she has not been compliant with her medications. She has not taken her Entresto for a while (she was not sure how long it has been). Her blood pressure stays in the 180/100's and heart rates range from 50-70.   She went to the ED last night for ongoing knee pain and her blood pressure was 184/132, heart rate 94.  Current medications: Carvedilol 12.5 mg bid Entresto 24-26 bid Furosemide 20 mg daily  She has a follow up on 2/13 with [name], PA. She was offered an appointment today but refused stating she wants to wait and keep her appointment on 2/13.  She has also not been taking her aspirin but stated that she will restart when she picks up her medications at the pharmacy today. She has been advised to keep a blood pressure log and call back if she becomes symptomatic.  Samples have been given of Entresto. She stated she will get the rest of her medications today.  Drug name: Delene Loll       Strength: 24-26 mg        Qty: 2 bottles  LOT: [id number]  Exp.Date: 10/2023  Patient made aware of ED precautions should new or worsening symptoms occur. Patient verbalized understanding.

## 2022-05-17 NOTE — Telephone Encounter (Signed)
Transition Care Management Unsuccessful Follow-up Telephone Call  Date of discharge and from where:  05/16/22 [facility]   Attempts:  1st Attempt  Reason for unsuccessful TCM follow-up call:  Left voice message

## 2022-05-23 ENCOUNTER — Telehealth: Payer: Self-pay | Admitting: Cardiovascular Disease

## 2022-05-23 NOTE — Telephone Encounter (Signed)
  Pt c/o medication issue:  1. Name of Medication:   ENTRESTO 24-26 MG    2. How are you currently taking this medication (dosage and times per day)? TAKE ONE TABLET BY MOUTH TWICE DAILY.   3. Are you having a reaction (difficulty breathing--STAT)? No   4. What is your medication issue? Pt is calling to follow up the prior auth for her entresto. Also, she said, she can pick up the samples today and checking if the samples is still available

## 2022-05-23 NOTE — Telephone Encounter (Signed)
Called patients pharmacy  Confirmed that they are able to run the medication through.   Copay - $4  Called patient to make her aware.

## 2022-05-23 NOTE — Telephone Encounter (Signed)
Approved today Approved. This drug has been approved. Approved quantity: 60 tablets per 30 day(s). You may fill up to a 34 day supply at a retail pharmacy. You may fill up to a 90 day supply for maintenance drugs, please refer to the formulary for details. Please call the pharmacy to process your prescription claim.

## 2022-05-23 NOTE — Telephone Encounter (Signed)
PA started through Covermymeds.   Eloy End (Key: [id number]) PA Case ID #: [id number]

## 2022-05-28 ENCOUNTER — Encounter: Payer: Self-pay | Admitting: Family Medicine

## 2022-05-28 ENCOUNTER — Other Ambulatory Visit
Admission: RE | Admit: 2022-05-28 | Discharge: 2022-05-28 | Disposition: A | Discharge status: Home / Self Care | Payer: Medicaid Other | Source: Ambulatory Visit | Attending: Physician Assistant | Admitting: Physician Assistant

## 2022-05-28 ENCOUNTER — Ambulatory Visit (INDEPENDENT_AMBULATORY_CARE_PROVIDER_SITE_OTHER): Payer: Medicaid Other | Admitting: Family Medicine

## 2022-05-28 ENCOUNTER — Ambulatory Visit: Payer: Medicaid Other | Attending: Physician Assistant | Admitting: Physician Assistant

## 2022-05-28 ENCOUNTER — Encounter: Payer: Self-pay | Admitting: Physician Assistant

## 2022-05-28 VITALS — BP 168/110 | HR 76 | Temp 97.9°F | Resp 16 | Ht 67.0 in | Wt 192.0 lb

## 2022-05-28 VITALS — BP 178/131 | HR 72 | Ht 67.0 in | Wt 192.4 lb

## 2022-05-28 DIAGNOSIS — R1010 Upper abdominal pain, unspecified: Secondary | ICD-10-CM | POA: Diagnosis not present

## 2022-05-28 DIAGNOSIS — D582 Other hemoglobinopathies: Secondary | ICD-10-CM

## 2022-05-28 DIAGNOSIS — I10 Essential (primary) hypertension: Secondary | ICD-10-CM

## 2022-05-28 DIAGNOSIS — I5042 Chronic combined systolic (congestive) and diastolic (congestive) heart failure: Secondary | ICD-10-CM | POA: Diagnosis not present

## 2022-05-28 DIAGNOSIS — Z5181 Encounter for therapeutic drug level monitoring: Secondary | ICD-10-CM | POA: Diagnosis not present

## 2022-05-28 DIAGNOSIS — E785 Hyperlipidemia, unspecified: Secondary | ICD-10-CM

## 2022-05-28 DIAGNOSIS — I25118 Atherosclerotic heart disease of native coronary artery with other forms of angina pectoris: Secondary | ICD-10-CM

## 2022-05-28 DIAGNOSIS — R946 Abnormal results of thyroid function studies: Secondary | ICD-10-CM

## 2022-05-28 DIAGNOSIS — Z1211 Encounter for screening for malignant neoplasm of colon: Secondary | ICD-10-CM

## 2022-05-28 DIAGNOSIS — N183 Chronic kidney disease, stage 3 unspecified: Secondary | ICD-10-CM | POA: Diagnosis not present

## 2022-05-28 DIAGNOSIS — N1832 Chronic kidney disease, stage 3b: Secondary | ICD-10-CM

## 2022-05-28 DIAGNOSIS — E038 Other specified hypothyroidism: Secondary | ICD-10-CM

## 2022-05-28 DIAGNOSIS — J449 Chronic obstructive pulmonary disease, unspecified: Secondary | ICD-10-CM | POA: Diagnosis not present

## 2022-05-28 DIAGNOSIS — Z7902 Long term (current) use of antithrombotics/antiplatelets: Secondary | ICD-10-CM

## 2022-05-28 DIAGNOSIS — I252 Old myocardial infarction: Secondary | ICD-10-CM

## 2022-05-28 DIAGNOSIS — M7989 Other specified soft tissue disorders: Secondary | ICD-10-CM | POA: Diagnosis not present

## 2022-05-28 DIAGNOSIS — I701 Atherosclerosis of renal artery: Secondary | ICD-10-CM

## 2022-05-28 DIAGNOSIS — Z5982 Transportation insecurity: Secondary | ICD-10-CM | POA: Diagnosis not present

## 2022-05-28 DIAGNOSIS — I255 Ischemic cardiomyopathy: Secondary | ICD-10-CM

## 2022-05-28 DIAGNOSIS — Z720 Tobacco use: Secondary | ICD-10-CM

## 2022-05-28 DIAGNOSIS — Z8719 Personal history of other diseases of the digestive system: Secondary | ICD-10-CM

## 2022-05-28 DIAGNOSIS — I5022 Chronic systolic (congestive) heart failure: Secondary | ICD-10-CM

## 2022-05-28 LAB — D-DIMER, QUANTITATIVE: D-Dimer, Quant: 2.47 ug{FEU}/mL — ABNORMAL HIGH (ref 0.00–0.50)

## 2022-05-28 MED ORDER — CARVEDILOL 25 MG PO TABS: 25.0000 mg | ORAL_TABLET | Freq: Two times a day (BID) | ORAL | 1 refills | Status: DC

## 2022-05-28 MED ORDER — PANTOPRAZOLE SODIUM 40 MG PO TBEC: 40.0000 mg | DELAYED_RELEASE_TABLET | Freq: Every day | ORAL | 3 refills | Status: DC

## 2022-05-28 MED ORDER — FAMOTIDINE 20 MG PO TABS: 20.0000 mg | ORAL_TABLET | Freq: Two times a day (BID) | ORAL | 2 refills | Status: DC | PRN

## 2022-05-28 MED ORDER — NICOTINE 14 MG/24HR TD PT24: 14.0000 mg | MEDICATED_PATCH | Freq: Every day | TRANSDERMAL | 1 refills | Status: DC

## 2022-05-28 NOTE — Assessment & Plan Note (Signed)
Continues to be a barrier to pt compliance with med instructions and ability to get meds, scans or do timely f/up

## 2022-05-28 NOTE — Assessment & Plan Note (Signed)
Per cardiology 

## 2022-05-28 NOTE — Assessment & Plan Note (Signed)
Lungs CTA today  She endorses generalized fatigue, no respiratory sx or recent exacerbations Pt likely not on any inhalers right now  Previously treated with trelegy and rescue inhaler and then more recently dulera

## 2022-05-28 NOTE — Progress Notes (Signed)
Name: Angelica Herrera   MRN: [id number]    DOB: Dec 23, 1970   Date:05/28/2022       Progress Note  Chief Complaint  Patient presents with   Follow-up   Hypothyroidism   Hypertension    Pt has not been complaint with her meds     Subjective:   Angelica Herrera is a 52 y.o. female, presents to clinic for routine f/up  Complicated medical hx with multiple SDOH barriers to care Recent msg to cardiology reported BP high - difficulty with getting and taking all meds regularly - managed by Dr. Fletcher Anon previously on diuretics, DAPT, entresto, coreg, hx of MI, ischemic cardiomyopathy, heart failure She just got her meds refilled and she has f/up with cardiology this afternoon BP Readings from Last 3 Encounters:  05/28/22 (!) 168/110  05/16/22 (!) 180/125  01/10/22 132/84  Bp elevated today, rechecked and remained elevated  Hypothyroid Lab Results  Component Value Date   TSH 10.03 (H) 12/27/2021  Meds doses were adjusted and labs ordered for pt to complete at lab close to her - that was not done since last OV. Levothyroxine dose 125 mcg daily Generally feels better but still zero energy, no sig weight changes  Wt Readings from Last 5 Encounters:  05/28/22 192 lb (87.1 kg)  05/16/22 180 lb (81.6 kg)  01/10/22 194 lb 3.2 oz (88.1 kg)  12/27/21 190 lb 1.6 oz (86.2 kg)  09/05/21 192 lb 12.8 oz (87.5 kg)   BMI Readings from Last 5 Encounters:  05/28/22 30.07 kg/m  05/16/22 29.05 kg/m  01/10/22 30.42 kg/m  12/27/21 29.77 kg/m  09/05/21 30.56 kg/m   Upper abd pain which she states is her pancreas - she points to epigastrum left upper quadrant and upper central chest she reports burning and pain which is worse with eating sometimes she feels extremely full after just 1 bite she also states it feels like it tube there.  And her medical history there is some note of prior pancreatitis (looks like it was put on chart/problem list May 2023 by a CMA - I cannot find any other info  related to that - no imaging, A&P or provider info/notes) her most recent abdominal imaging (2020) did not show any active pancreatitis and I do not believe there is a diagnosis of chronic pancreatitis.  In 2021 I ordered CT abd/pelvis and it was never completed She has never seen a GI specialist.  She reports she is taking Pepto-Bismol every day at bedtime to get some sleep.  She states she has a bowel movement about once daily.  She also has other aches and pains in her abdomen she points to her right lower to right mid abdomen as well.  Sometimes the pain and fullness are associated with nausea or even vomiting she states she does drink alcohol, does not take NSAIDs.     Current Outpatient Medications:    acetaminophen (TYLENOL) 500 MG tablet, Take 1,000 mg by mouth every 6 (six) hours as needed., Disp: , Rfl:    aspirin 81 MG tablet, Take 1 tablet (81 mg total) by mouth daily., Disp: 30 tablet, Rfl:    atorvastatin (LIPITOR) 80 MG tablet, TAKE ONE TABLET BY MOUTH AT BEDTIME., Disp: 30 tablet, Rfl: 0   busPIRone (BUSPAR) 5 MG tablet, Take 1-3 tablets (5-15 mg total) by mouth 2 (two) times daily as needed (anxiety, irritability)., Disp: 120 tablet, Rfl: 2   carvedilol (COREG) 12.5 MG tablet, TAKE (1) TABLET BY MOUTH  TWICE DAILY WITH A MEAL, Disp: 60 tablet, Rfl: 0   cetirizine (ZYRTEC) 10 MG tablet, TAKE (1) TABLET BY MOUTH DAILY AT BEDTIME, Disp: 30 tablet, Rfl: 11   clopidogrel (PLAVIX) 75 MG tablet, TAKE (1) TABLET BY MOUTH EVERY DAY, Disp: 90 tablet, Rfl: 0   colchicine 0.6 MG tablet, Take 1 tablet (0.6 mg total) by mouth daily as needed (gout)., Disp: 15 tablet, Rfl: 0   ENTRESTO 24-26 MG, TAKE ONE TABLET BY MOUTH TWICE DAILY., Disp: 60 tablet, Rfl: 5   famotidine (PEPCID) 20 MG tablet, Take 1 tablet (20 mg total) by mouth 2 (two) times daily as needed for heartburn or indigestion., Disp: 60 tablet, Rfl: 2   furosemide (LASIX) 20 MG tablet, TAKE (1) TABLET BY MOUTH EVERY DAY, Disp: 90  tablet, Rfl: 0   gabapentin (NEURONTIN) 300 MG capsule, TAKE (1) CAPSULE BY MOUTH TWICE DAILY, Disp: 180 capsule, Rfl: 0   levothyroxine (SYNTHROID) 125 MCG tablet, Take 1 tablet (125 mcg total) by mouth daily before breakfast., Disp: 90 tablet, Rfl: 1   nitroGLYCERIN (NITROSTAT) 0.4 MG SL tablet, Place 1 tablet (0.4 mg total) under the tongue every 5 (five) minutes x 3 doses as needed for chest pain., Disp: 25 tablet, Rfl: 3   pantoprazole (PROTONIX) 40 MG tablet, Take 1 tablet (40 mg total) by mouth daily. Empty stomach - take more than 4 hours after taking levothyroxine, Disp: 30 tablet, Rfl: 3   PARoxetine (PAXIL) 40 MG tablet, TAKE (1) TABLET BY MOUTH EVERY DAY, Disp: 90 tablet, Rfl: 1   sacubitril-valsartan (ENTRESTO) 24-26 MG, Take 1 tablet by mouth 2 (two) times daily., Disp: 180 tablet, Rfl: 2  Current Facility-Administered Medications:    mometasone-formoterol (DULERA) 200-5 MCG/ACT inhaler 2 puff, 2 puff, Inhalation, BID, Delsa Grana, PA-C  Patient Active Problem List   Diagnosis Date Noted   Lack of access to transportation 12/27/2021   Arthritis 09/04/2021   Chronic combined systolic (congestive) and diastolic (congestive) heart failure ([place]) 09/04/2021   Pancreatitis 09/04/2021   Stage 3b chronic kidney disease ([place]) 02/18/2020   Anxiety with depression 02/18/2020   Tobacco dependence 02/18/2020   Rhinosinusitis 09/02/2019   Chronic obstructive pulmonary disease ([place]) 09/02/2019   Squamous cell carcinoma of back 04/20/2019   Accelerated hypertension 11/13/2018   Chest pain 11/12/2018   S/P primary angioplasty with coronary stent 03/04/2018   Depression, recurrent ([place]) [id number]   Chronic systolic heart failure ([place]) 11/21/2017   Controlled substance agreement signed 09/07/2015   Coronary artery disease involving native coronary artery    Allergic rhinitis due to pollen 06/28/2015   GAD (generalized anxiety disorder) 03/17/2015   Degenerative disc disease, cervical  03/17/2015   Hyperlipidemia    Obesity    CKD (chronic kidney disease), stage IV (HCC)    Ischemic cardiomyopathy    Hypothyroidism    History of acute anterior wall MI 02/12/2012   Essential hypertension, malignant 02/12/2012   Tobacco abuse 02/12/2012    Past Surgical History:  Procedure Laterality Date   ABDOMINAL HYSTERECTOMY     CARDIAC CATHETERIZATION  2013   [facility] s/p stent   CARDIAC CATHETERIZATION Left 08/03/2015   Procedure: Left Heart Cath and Coronary Angiography;  Surgeon: Wellington Hampshire, MD;  Location: [facility];  Service: Cardiovascular;  Laterality: Left;   DILATION AND CURETTAGE OF UTERUS     LEFT HEART CATHETERIZATION WITH CORONARY ANGIOGRAM N/A 02/11/2012   Procedure: LEFT HEART CATHETERIZATION WITH CORONARY ANGIOGRAM;  Surgeon: [name], MD;  Location: [facility];  Service: Cardiovascular;  Laterality: N/A;   RENAL INTERVENTION N/A 11/17/2018   Procedure: RENAL INTERVENTION;  Surgeon: Katha Cabal, MD;  Location: [facility];  Service: Cardiovascular;  Laterality: N/A;    Family History  Problem Relation Age of Onset   Hypertension Father    Thyroid disease Father    AAA (abdominal aortic aneurysm) Father    Hypertension Mother    Hyperlipidemia Mother    Diabetes Mother    Kidney disease Mother    Hypertension Sister    Hypertension Sister    Supraventricular tachycardia Sister    Hypertension Brother    Hypertension Brother    Hypertension Daughter    Cancer Paternal Grandfather        lung    Social History   Tobacco Use   Smoking status: Every Day    Packs/day: 0.25    Years: 30.00    Total pack years: 7.50    Types: Cigarettes   Smokeless tobacco: Never  Vaping Use   Vaping Use: Never used  Substance Use Topics   Alcohol use: Not Currently    Comment: Says she previously drank socially - never a heavy drinker.  No etoh since 08/2018.   Drug use: No    Types: Marijuana     No Known Allergies  Health  Maintenance  Topic Date Due   DTaP/Tdap/Td (1 - Tdap) Never done   INFLUENZA VACCINE  07/14/2022 (Originally 11/13/2021)   Fecal DNA (Cologuard)  05/29/2023 (Originally 08/16/2014)   Hepatitis C Screening  Completed   HIV Screening  Completed   HPV VACCINES  Aged Out   MAMMOGRAM  Discontinued   PAP SMEAR-Modifier  Discontinued   COVID-19 Vaccine  Discontinued   Zoster Vaccines- Shingrix  Discontinued    Chart Review Today: I personally reviewed active problem list, medication list, allergies, family history, social history, health maintenance, notes from last encounter, lab results, imaging with the patient/caregiver today.   Review of Systems  Constitutional: Negative.   HENT: Negative.    Eyes: Negative.   Respiratory: Negative.    Cardiovascular: Negative.   Gastrointestinal: Negative.   Endocrine: Negative.   Genitourinary: Negative.   Musculoskeletal: Negative.   Skin: Negative.   Allergic/Immunologic: Negative.   Neurological: Negative.   Hematological: Negative.   Psychiatric/Behavioral: Negative.    All other systems reviewed and are negative.    Objective:   Vitals:   05/28/22 1106 05/28/22 1122  BP: (!) 168/110 (!) 168/110  Pulse: 76   Resp: 16   Temp: 97.9 F (36.6 C)   TempSrc: Oral   SpO2: 92%   Weight: 192 lb (87.1 kg)   Height: 5' 7"$  (1.702 m)     Body mass index is 30.07 kg/m.  Physical Exam Vitals and nursing note reviewed.  Constitutional:      General: She is not in acute distress.    Appearance: Normal appearance. She is well-developed. She is not ill-appearing, toxic-appearing or diaphoretic.  HENT:     Head: Normocephalic and atraumatic.     Nose: Nose normal.  Eyes:     General: No scleral icterus.       Right eye: No discharge.        Left eye: No discharge.     Conjunctiva/sclera: Conjunctivae normal.  Neck:     Trachea: No tracheal deviation.  Cardiovascular:     Rate and Rhythm: Normal rate and regular rhythm.     Pulses:  Normal pulses.     Heart sounds:     No gallop.  Pulmonary:     Effort: Pulmonary effort is normal. No respiratory distress.     Breath sounds: No stridor. No wheezing, rhonchi or rales.  Abdominal:     General: Bowel sounds are normal. There is no distension.     Palpations: Abdomen is soft.     Tenderness: There is abdominal tenderness. There is no guarding or rebound.  Musculoskeletal:        General: Normal range of motion.     Right lower leg: Edema present.     Left lower leg: Edema (1+) present.  Skin:    General: Skin is warm and dry.     Coloration: Skin is not jaundiced or pale.     Findings: No rash.  Neurological:     Mental Status: She is alert. Mental status is at baseline.     Motor: No abnormal muscle tone.     Coordination: Coordination normal.     Gait: Gait normal.  Psychiatric:        Mood and Affect: Mood normal.        Behavior: Behavior normal.         Assessment & Plan:   Problem List Items Addressed This Visit       Cardiovascular and Mediastinum   Accelerated hypertension    Bp elevated today - she has contacted her cardiologist, was able to get some meds she was out of and she has f/up later today BP per specialist - I would expect it to improve with improved med compliance Some LE edema, she does have her lasix, no orthopnea or CP      Relevant Orders   COMPLETE METABOLIC PANEL WITH GFR   Chronic combined systolic (congestive) and diastolic (congestive) heart failure (HCC)    Pt still having difficulty with getting and staying on all meds, high BP can easily lead to exacerbation - some pitting edema to LE, but no recent/sudden weight changes, orthopnea, PND, CP - seeing cardiology later today        Respiratory   Chronic obstructive pulmonary disease ([place])    Lungs CTA today  She endorses generalized fatigue, no respiratory sx or recent exacerbations Pt likely not on any inhalers right now  Previously treated with trelegy and rescue  inhaler and then more recently dulera      Relevant Orders   COMPLETE METABOLIC PANEL WITH GFR   CBC with Differential/Platelet     Endocrine   Hypothyroidism - Primary    Last OV in Sept TSH was high and levothyroxine dose increase She has been taking 125 mcg daily  She did not f/up or recheck labs as instructed She feels generally better after the dose increase Due for TSH recheck today      Relevant Orders   TSH     Genitourinary   Stage 3b chronic kidney disease (HCC)    BP not optimally controlled, monitoring renal function      Relevant Orders   COMPLETE METABOLIC PANEL WITH GFR     Other   History of acute anterior wall MI    Per cardiology      Tobacco abuse   Hyperlipidemia    Unclear if pt was able to get back on and stay on statin Last labs were not as high as anticipated - statin per cardiology  Did not feel recheck of lipids today was indicated Lab Results  Component  Value Date   CHOL 109 12/27/2021   HDL 52 12/27/2021   LDLCALC 36 12/27/2021   TRIG 128 12/27/2021   CHOLHDL 2.1 12/27/2021  Pt should strive for med compliance and healthy diet/lifestyle efforts       Relevant Orders   COMPLETE METABOLIC PANEL WITH GFR   Lack of access to transportation    Continues to be a barrier to pt compliance with med instructions and ability to get meds, scans or do timely f/up      Elevated hemoglobin ([place])    Fairly chronic and stable, likely from smoking      Relevant Orders   CBC with Differential/Platelet   History of pancreatitis    Past lipase elevation w/o CT findings of pancreatitis Prior attempts (multiple since 2020) from this office to help her get f/up with imaging, labs and GI consult have not been completed by the pt Sx seem to be fairly chronic and stable - some generalized abd pain - RLQ and across upper abdomen, worse with eating Labs today Will see if we needs imaging - CT vs upper abd [place] Pt likely still needs to consult with GI   She was instructed to avoid all ETOH      Other Visit Diagnoses     Encounter for monitoring antiplatelet therapy       on DAPT for years - she ran out of ASA, no bleeding or bruising concerns reported   Relevant Orders   COMPLETE METABOLIC PANEL WITH GFR   CBC with Differential/Platelet   Encounter for medication monitoring       Relevant Orders   COMPLETE METABOLIC PANEL WITH GFR   CBC with Differential/Platelet   TSH   Pain of upper abdomen       ttp across upper abd w/o guarding or rebound.  lipase to assess for pancreatitis, avoid ETOH, gastritis/esophagitis/PUD also in DDX, protonix trial   Relevant Medications   famotidine (PEPCID) 20 MG tablet   pantoprazole (PROTONIX) 40 MG tablet   Other Relevant Orders   COMPLETE METABOLIC PANEL WITH GFR   Lipase   Ambulatory referral to Gastroenterology   Screening for malignant neoplasm of colon       Relevant Orders   Ambulatory referral to Gastroenterology        Return for 1 month abd pain f/up and BP recheck.   Delsa Grana, PA-C 05/28/22 12:13 PM

## 2022-05-28 NOTE — Assessment & Plan Note (Signed)
Unclear if pt was able to get back on and stay on statin Last labs were not as high as anticipated - statin per cardiology  Did not feel recheck of lipids today was indicated Lab Results  Component Value Date   CHOL 109 12/27/2021   HDL 52 12/27/2021   LDLCALC 36 12/27/2021   TRIG 128 12/27/2021   CHOLHDL 2.1 12/27/2021  Pt should strive for med compliance and healthy diet/lifestyle efforts

## 2022-05-28 NOTE — Assessment & Plan Note (Signed)
Past lipase elevation w/o CT findings of pancreatitis Prior attempts (multiple since 2020) from this office to help her get f/up with imaging, labs and GI consult have not been completed by the pt Sx seem to be fairly chronic and stable - some generalized abd pain - RLQ and across upper abdomen, worse with eating Labs today Will see if we needs imaging - CT vs upper abd [place] Pt likely still needs to consult with GI  She was instructed to avoid all ETOH

## 2022-05-28 NOTE — Assessment & Plan Note (Signed)
Fairly chronic and stable, likely from smoking

## 2022-05-28 NOTE — Assessment & Plan Note (Signed)
Bp elevated today - she has contacted her cardiologist, was able to get some meds she was out of and she has f/up later today BP per specialist - I would expect it to improve with improved med compliance Some LE edema, she does have her lasix, no orthopnea or CP

## 2022-05-28 NOTE — Assessment & Plan Note (Signed)
BP not optimally controlled, monitoring renal function

## 2022-05-28 NOTE — Progress Notes (Addendum)
Cardiology Office Note    Date:  05/28/2022   ID:  Angelica Herrera, DOB 1970-12-06, MRN [id number]  PCP:  Delsa Grana, PA-C  Cardiologist:  Kathlyn Sacramento, MD  Electrophysiologist:  None   Chief Complaint: Follow-up  History of Present Illness:   Angelica Herrera is a 52 y.o. female with history of CAD with anterolateral ST elevation MI in 01/2012 with PTCA/DES to the left main at that time as detailed below complicated by hemodynamic instability and VT, HFrEF secondary to ICM, left renal artery stenosis status post left renal artery stenting in 11/2018 by vascular surgery, CKD stage III, HLD, hypothyroidism, depression, and tobacco use who presents for follow-up of her CAD and cardiomyopathy.   She was admitted to the hospital in 01/2012 with an anterolateral STEMI. She underwent emergent cardiac catheterization which showed occlusion of the left main coronary artery. There was significant difficulty engaging the left main, though she ultimately underwent successful PCI/DES. She also had PTCA done to the LAD due to embolization from the left main. Post procedure she had significant hemodynamic instablity with VT and hypotension. She was noted to have severe MR initially, however subsequent echocardiogram showed an EF of 45% with no significant MR. She was lost to follow up from 2013-2017. Upon re-establishing with our group in 2017, she continued to smoke and had been off her medications. In 2017, she had atypical chest pain with a Lexiscan MPI showing prior anterior infarct with mild peri-infarct ischemia and an EF of 33%. She underwent repeat cardiac cath at that time which showed a patent left main stent with moderate mid LAD stenosis and an occluded D2 with faint collaterals. The RCA was very large with mild mid stenosis. EF was 35-40% with anterior wall hypokinesis. Continued medical therapy was advised. She underwent repeat nuclear stress testing in 02/2017 for recurrent chest pain that  showed a similar finding of prior anterior infarct with mild peri-infarct ischemia.  Echo at that time demonstrated an EF of 30 to 35%, diffuse hypokinesis with severe hypokinesis/possible akinesis of the anterior and lateral walls, grade 1 diastolic dysfunction, mild to moderate mitral regurgitation, normal RV systolic function, and mildly elevated PASP.  Most recent echo from 02/2019 demonstrated an EF of 35 to 40%, anterior and septal wall hypokinesis, grade 2 diastolic dysfunction, mildly dilated LV internal cavity size, normal RV systolic function with mildly enlarged ventricular cavity, mildly to moderately dilated left atrium, mildly dilated right atrium, moderately elevated PASP estimated at 51.2 mmHg.   She was seen in the office in 02/2021 and had been out of carvedilol, Entresto, and furosemide.  She was without symptoms of angina or decompensation.  She continued to smoke one fourth a pack of cigarettes per day.  She had gained significant weight over the preceding 6 months.  GDMT was reinitiated.  She was last seen in the office in 08/2021 and remained without symptoms of angina or decompensation.  She reported frequently missing her p.m. doses of Entresto and carvedilol.  Blood pressure was well-controlled at home, though BP was elevated in the office at 188/122 which was felt to be in the setting of frequently missing medications and [facility] intake.  She continued to smoke a fourth of a pack of cigarettes per day.  Smoking cessation and medication adherence were recommended.  She was seen in the ED on 05/16/2022 with pain and swelling involving left knee without a known trauma.  Plain film imaging showed a moderate joint effusion with no  acute fracture or dislocation.  She was placed in a hinged knee brace with recommendation to follow-up with orthopedics.  She comes in today and is doing reasonably well from a cardiac perspective.  She is without symptoms of angina or cardiac  decompensation.  No palpitations, dizziness, presyncope, or syncope.  She is now back on cardiac medications.  She reported having been out of Entresto for approximately 2 weeks with resumption of this medication on 05/25/2022.  She has been adherent to medications on a daily basis and has been taking twice daily medications without missing any doses over the past several days.  She reports continued left lower extremity swelling dating back to earlier this month when she was evaluated in the ED.  No recent prolonged sedentary time frames, vascular trauma, or known hypercoagulable state.  Weight stable by our scale.  She continues to smoke 1/4 pack of cigarettes per day and is interested in quitting with nicotine patches today.    Labs independently reviewed: 12/2021 - TC 109, TG 128, HDL 52, LDL 36, TSH 10.03, BUN 12, serum creatinine 1.19, potassium 3.3, albumin 3.9, AST/ALT normal, Hgb 16.2, PLT 238 01/2021 - A1c 5.5  Past Medical History:  Diagnosis Date   Accelerated hypertension 11/13/2018   Allergic rhinitis due to pollen 06/28/2015   Anxiety with depression 02/18/2020   Arthritis    gout   Chest pain 11/12/2018   Chronic combined systolic (congestive) and diastolic (congestive) heart failure ([place])    a. 02/2017 Echo: EF 30-35%, diff HK, sev HK/poss AK of ant and lat walls. Gr1 DD. Mild to mod MR. Nl RV fxn. Mod TR.   Chronic obstructive pulmonary disease ([place]) 09/02/2019   tx with trelegy and SABA   Chronic systolic heart failure ([place]) 11/21/2017   CKD (chronic kidney disease), stage IV ([place])    Controlled substance agreement signed 09/07/2015   Coronary artery disease involving native coronary artery    Degenerative disc disease, cervical 03/17/2015   Depression, recurrent ([place]) 12/02/2017   Essential hypertension, malignant 02/12/2012   GAD (generalized anxiety disorder) 03/17/2015   History of acute anterior wall MI 02/12/2012   Hyperlipidemia    Hypothyroidism    Ischemic cardiomyopathy     a. 02/2012 Echo: EF 40-45%, ant/lat HK, Gr1 DD, Mild MR, PASP 72mHg; b. 02/2017 Echo: EF 30-35%.   NSVT (nonsustained ventricular tachycardia) ([place]    a. 02/2012 post-MI   Obesity    Pancreatitis    Rhinosinusitis 09/02/2019   S/P primary angioplasty with coronary stent 03/04/2018   Dual antiplatelet therapy INDEFINITELY per cardiology note Nov 2019   Squamous cell carcinoma of back 04/20/2019   referrred to sugery for f/up excision, derm not available for several month, lesion with rapid change cannot wait, still see derm for skin survey etc   Stage 3b chronic kidney disease ([place] 02/18/2020   Tobacco abuse    Tobacco dependence 02/18/2020    Past Surgical History:  Procedure Laterality Date   ABDOMINAL HYSTERECTOMY     CARDIAC CATHETERIZATION  2013   [facility] s/p stent   CARDIAC CATHETERIZATION Left 08/03/2015   Procedure: Left Heart Cath and Coronary Angiography;  Surgeon: MWellington Hampshire MD;  [facility];  Service: Cardiovascular;  Laterality: Left;   DILATION AND CURETTAGE OF UTERUS     LEFT HEART CATHETERIZATION WITH CORONARY ANGIOGRAM N/A 02/11/2012   Procedure: LEFT HEART CATHETERIZATION WITH CORONARY ANGIOGRAM;  Surgeon: MSherren Mocha MD;  [facility];  Service: Cardiovascular;  Laterality: N/A;   RENAL INTERVENTION N/A 11/17/2018   Procedure: RENAL INTERVENTION;  Surgeon: Katha Cabal, MD;  Location: [place] [facility];  Service: Cardiovascular;  Laterality: N/A;    Current Medications: Current Meds  Medication Sig   acetaminophen (TYLENOL) 500 MG tablet Take 1,000 mg by mouth every 6 (six) hours as needed.   aspirin 81 MG tablet Take 1 tablet (81 mg total) by mouth daily.   atorvastatin (LIPITOR) 80 MG tablet TAKE ONE TABLET BY MOUTH AT BEDTIME.   busPIRone (BUSPAR) 5 MG tablet Take 1-3 tablets (5-15 mg total) by mouth 2 (two) times daily as needed (anxiety, irritability).   carvedilol (COREG) 25 MG tablet Take 1 tablet (25 mg total)  by mouth 2 (two) times daily.   cetirizine (ZYRTEC) 10 MG tablet TAKE (1) TABLET BY MOUTH DAILY AT BEDTIME   clopidogrel (PLAVIX) 75 MG tablet TAKE (1) TABLET BY MOUTH EVERY DAY   colchicine 0.6 MG tablet Take 1 tablet (0.6 mg total) by mouth daily as needed (gout).   ENTRESTO 24-26 MG TAKE ONE TABLET BY MOUTH TWICE DAILY.   famotidine (PEPCID) 20 MG tablet Take 1 tablet (20 mg total) by mouth 2 (two) times daily as needed for heartburn or indigestion.   furosemide (LASIX) 20 MG tablet TAKE (1) TABLET BY MOUTH EVERY DAY   gabapentin (NEURONTIN) 300 MG capsule TAKE (1) CAPSULE BY MOUTH TWICE DAILY   levothyroxine (SYNTHROID) 125 MCG tablet Take 1 tablet (125 mcg total) by mouth daily before breakfast.   nicotine (NICODERM CQ - DOSED IN MG/24 HOURS) 14 mg/24hr patch Place 1 patch (14 mg total) onto the skin daily.   nitroGLYCERIN (NITROSTAT) 0.4 MG SL tablet Place 1 tablet (0.4 mg total) under the tongue every 5 (five) minutes x 3 doses as needed for chest pain.   pantoprazole (PROTONIX) 40 MG tablet Take 1 tablet (40 mg total) by mouth daily. Empty stomach - take more than 4 hours after taking levothyroxine   sacubitril-valsartan (ENTRESTO) 24-26 MG Take 1 tablet by mouth 2 (two) times daily.   [DISCONTINUED] carvedilol (COREG) 12.5 MG tablet TAKE (1) TABLET BY MOUTH TWICE DAILY WITH A MEAL   Current Facility-Administered Medications for the 05/28/22 encounter (Office Visit) with [name], PA-C  Medication   mometasone-formoterol (DULERA) 200-5 MCG/ACT inhaler 2 puff    Allergies:   Patient has no known allergies.   Social History   Socioeconomic History   Marital status: Legally Separated    Spouse name: Not on file   Number of children: Not on file   Years of education: Not on file   Highest education level: Not on file  Occupational History   Not on file  Tobacco Use   Smoking status: Every Day    Packs/day: 0.25    Years: 30.00    Total pack years: 7.50    Types: Cigarettes    Smokeless tobacco: Never  Vaping Use   Vaping Use: Never used  Substance and Sexual Activity   Alcohol use: Not Currently    Comment: Says she previously drank socially - never a heavy drinker.  No etoh since 08/2018.   Drug use: No    Types: Marijuana   Sexual activity: Not on file  Other Topics Concern   Not on file  Social History Narrative   Lives in [place] w/ her dtr.  Does not routinely exercise.  Disabled/doesn't work.   Social Determinants of Health   Financial Resource Strain: Not on  file  Food Insecurity: Not on file  Transportation Needs: Not on file  Physical Activity: Not on file  Stress: Not on file  Social Connections: Not on file     Family History:  The patient's family history includes AAA (abdominal aortic aneurysm) in her father; Cancer in her paternal grandfather; Diabetes in her mother; Hyperlipidemia in her mother; Hypertension in her brother, brother, daughter, father, mother, sister, and sister; Kidney disease in her mother; Supraventricular tachycardia in her sister; Thyroid disease in her father.  ROS:   12-point review of systems is negative unless otherwise noted in HPI.   EKGs/Labs/Other Studies Reviewed:    Studies reviewed were summarized above. The additional studies were reviewed today:  2D echo 02/19/2019:  1. Left ventricular ejection fraction, by visual estimation, is 35 to  40%. The left ventricle has moderately decreased function. There is no  left ventricular hypertrophy. Anterior and Septal wall hypokinesis.   2. Left ventricular diastolic parameters are consistent with Grade II  diastolic dysfunction (pseudonormalization).   3. Mildly dilated left ventricular internal cavity size.   4. Global right ventricle has normal systolic function.The right  ventricular size is mildly enlarged. No increase in right ventricular wall  thickness.   5. Left atrial size was mild-moderately dilated.   6. Right atrial size was mildly dilated.    7. Moderately elevated pulmonary artery systolic pressure.   8. The tricuspid regurgitant velocity is 2.80 m/s, and with an assumed  right atrial pressure of 20 mmHg, the estimated right ventricular systolic  pressure is moderately elevated at 51.2 mmHg.   9. Small pericardial effusion. __________   2D echo 02/21/2017: - Left ventricle: The cavity size was normal. Systolic function was    moderately to severely reduced. The estimated ejection fraction    was in the range of 30% to 35%. Diffuse hypokinesis with severe    hypokinesis /possible akinesis of the anterior and lateral walls.    Doppler parameters are consistent with abnormal left ventricular    relaxation (grade 1 diastolic dysfunction).  - Mitral valve: There was mild to moderate regurgitation.  - Left atrium: The atrium was normal in size.  - Right ventricle: Systolic function was normal.  - Tricuspid valve: There was moderate regurgitation.  - Pulmonary arteries: Systolic pressure was mildly elevated. __________   Carlton Adam MPI 02/14/2017: There was no ST segment deviation noted during stress. No T wave inversion was noted during stress. Defect 1: There is a medium defect of severe severity present in the basal anterior, basal anterolateral, mid anterior and mid anterolateral location. This is an intermediate risk study. The left ventricular ejection fraction is moderately decreased (30-44%). Findings consistent with prior myocardial infarction with mild peri-infarct ischemia. __________   2D echo 10/06/2015: - Left ventricle: The cavity size was normal. Systolic function was    moderately reduced. The estimated ejection fraction was in the    range of 35% to 40%. Hypokinesis of the anterior myocardium.    Hypokinesis of the anteroseptal myocardium. Doppler parameters    are consistent with abnormal left ventricular relaxation (grade 1    diastolic dysfunction).  - Mitral valve: Calcified annulus. There was mild  regurgitation.  - Left atrium: The atrium was normal in size.  - Right ventricle: Systolic function was normal.  - Pulmonary arteries: Systolic pressure was within the normal    range. ___________   LHC 08/03/2015: Ost LM to LM lesion, 10% stenosed. The lesion was previously treated with a stent (  unknown type). Mid LAD lesion, 60% stenosed. Mid RCA lesion, 30% stenosed. There is moderate left ventricular systolic dysfunction.   1. Widely patent ostial left main stent with moderate mid LAD stenosis and occluded second diagonal with faint collaterals. The right coronary artery is very large with mild mid stenosis. 2. Moderately reduced LV systolic function with an ejection fraction of 35-40% with significant anterior wall hypokinesis. Mildly elevated left ventricular end-diastolic pressure. 3. Significant radial artery spasm which required increased amounts of sedation.   Recommendations: Continue medical therapy for coronary artery disease and chronic systolic heart failure. We will need to transition her from clonidine to an ACE inhibitor or ARB with close monitoring of renal function. __________   Carlton Adam MPI 07/28/2015: Pharmacological myocardial perfusion imaging study with attenuation corrected images suggesting mild anterior wall ischemia, mid to apical region Nonattenuation corrected images also with mild mid to apical anterior wall ischemia Patient attempted exercise on bruce protocol though unable to reach target heart rate, changed to lexiscan Anteroseptal wall hypokinesis noted, EF estimated at 33% No EKG changes concerning for ischemia at peak stress or in recovery. Moderate risk scan with mild ischemia as above __________   2D echo 02/12/2012: - Left ventricle: The cavity size was normal. Wall thickness    was increased in a pattern of mild LVH. Systolic function    was mildly to moderately reduced. The estimated ejection    fraction was in the range of 40% to 45%.  Hypokinesis of    the entireanterior and lateral myocardium. Doppler    parameters are consistent with abnormal left ventricular    relaxation (grade 1 diastolic dysfunction).  - Mitral valve: Mild regurgitation.  - Pulmonary arteries: PA peak pressure: 60m Hg (S).   EKG:  EKG is ordered today.  The EKG ordered today demonstrates NSR, 72 bpm, incomplete RBBB, consistent with prior tracing  Recent Labs: 12/27/2021: ALT 8; BUN 12; Creat 1.19; Hemoglobin 16.2; Platelets 238; Potassium 3.3; Sodium 142; TSH 10.03  Recent Lipid Panel    Component Value Date/Time   CHOL 109 12/27/2021 1506   CHOL 136 02/27/2018 1207   TRIG 128 12/27/2021 1506   HDL 52 12/27/2021 1506   HDL 53 02/27/2018 1207   CHOLHDL 2.1 12/27/2021 1506   VLDL 15 11/13/2018 0628   LDLCALC 36 12/27/2021 1506    PHYSICAL EXAM:    VS:  BP (!) 178/131 (BP Location: Left Arm, Patient Position: Sitting, Cuff Size: Normal)   Pulse 72   Ht 5' 7"$  (1.702 m)   Wt 192 lb 6.4 oz (87.3 kg)   SpO2 93%   BMI 30.13 kg/m   BMI: Body mass index is 30.13 kg/m.  Physical Exam Vitals reviewed.  Constitutional:      Appearance: She is well-developed.  HENT:     Head: Normocephalic and atraumatic.  Eyes:     General:        Right eye: No discharge.        Left eye: No discharge.  Neck:     Vascular: No JVD.  Cardiovascular:     Rate and Rhythm: Normal rate and regular rhythm.     Heart sounds: S1 normal and S2 normal. Heart sounds not distant. No midsystolic click and no opening snap. Murmur heard.     Systolic murmur is present at the upper left sternal border.     No friction rub.  Pulmonary:     Effort: Pulmonary effort is normal. No respiratory distress.  Breath sounds: Normal breath sounds. No decreased breath sounds, wheezing or rales.  Chest:     Chest wall: No tenderness.  Abdominal:     General: There is no distension.     Palpations: Abdomen is soft.     Tenderness: There is no abdominal tenderness.   Musculoskeletal:     Cervical back: Normal range of motion.     Left lower leg: Edema present.     Comments: Mild left lower extremity pitting edema to the knee without evidence of erythema, swelling, warmth, or cording.  Skin:    General: Skin is warm and dry.     Nails: There is no clubbing.  Neurological:     Mental Status: She is alert and oriented to person, place, and time.  Psychiatric:        Speech: Speech normal.        Behavior: Behavior normal.        Thought Content: Thought content normal.        Judgment: Judgment normal.     Wt Readings from Last 3 Encounters:  05/28/22 192 lb 6.4 oz (87.3 kg)  05/28/22 192 lb (87.1 kg)  05/16/22 180 lb (81.6 kg)     ASSESSMENT & PLAN:   CAD involving the native coronaries without angina: She is doing well and is without symptoms concerning for progressive angina or cardiac decompensation.  Continue aggressive secondary prevention and risk factor modification including aspirin and clopidogrel, given prior left main stenting, indefinitely along with continuation of carvedilol and atorvastatin.  Aggressive risk factor modification, including complete smoking cessation is encouraged.  No indication for further ischemic testing at this time.  HFrEF secondary to ICM: She is euvolemic and well compensated.  She has now been back on Entresto for several days after being out of the medication for approximately 2 weeks.  Titrate carvedilol to 25 mg twice daily.  We deferred titration of Entresto at this time given she just picked up her prescription 3 days ago.  We will plan to titrate Entresto at next visit as BP and labs allow.  She otherwise remains on low-dose furosemide.  Not currently on MRA or SGLT2 inhibitor secondary to prior renal dysfunction.  She has labs pending through PCP's office.  We will consider further escalation of GDMT in follow-up based on these labs.  Would look to repeat echo in several months time on maximally tolerated  GDMT to evaluate for improvement in LV systolic function following medication adherence.  Should her cardiomyopathy persist, would need to consider repeat ischemic evaluation as well as potential cardiac MRI.  HTN: Blood pressure remains suboptimally controlled, more recently exacerbated by medical nonadherence.  Titrate carvedilol as outlined above with continuation of Entresto.  Medication adherence is recommended.  HLD: LDL 36.  She remains on atorvastatin 80 mg.  CKD stage III: Followed by PCP.  Labs pending.  Renal artery stenosis: Status post left renal artery stenting.  Schedule renal artery ultrasound given elevated BP readings.  Tobacco use: Continues to smoke 1/4 pack of cigarettes per day.  Complete cessation is encouraged.  She requests a prescription for nicotine patches.  Obesity: Weight loss is encouraged throughout healthy diet and regular exercise.  Abnormal thyroid function: Remains on replacement therapy.  Ongoing management per PCP.  Left lower extremity swelling: Noted after effusion involving the left knee.  Obtain stat D-dimer.  If abnormal, patient will need to proceed to the ED for further evaluation.   Disposition: F/u with Dr. Fletcher Anon  or an APP in 1 month.   Medication Adjustments/Labs and Tests Ordered: Current medicines are reviewed at length with the patient today.  Concerns regarding medicines are outlined above. Medication changes, Labs and Tests ordered today are summarized above and listed in the Patient Instructions accessible in Encounters.   Signed, [name], PA-C 05/28/2022 4:39 PM     [facility] [address] [place], [place] [address] [phone]

## 2022-05-28 NOTE — Patient Instructions (Signed)
Medication Instructions:  INCREASE carvedilol to 25 mg by mouth twice a day START Nicotine patch 14 mg daily  *If you need a refill on your cardiac medications before your next appointment, please call your pharmacy*  Lab Work: STAT D-dimer - Please go to the [facility] You will check in at the front desk to the right as you walk into the atrium. Valet Parking is offered if needed. - No appointment needed. You may go any day between 7 am and 6 pm.  If you have labs (blood work) drawn today and your tests are completely normal, you will receive your results only by: [facility] (if you have MyChart) OR A paper copy in the mail If you have any lab test that is abnormal or we need to change your treatment, we will call you to review the results.  Testing/Procedures: Your physician has requested that you have a renal artery duplex. During this test, an ultrasound is used to evaluate blood flow to the kidneys. Allow one hour for this exam. Do not eat after midnight the day before and avoid carbonated beverages. Take your medications as you usually do.  Follow-Up: At [facility], you and your health needs are our priority.  As part of our continuing mission to provide you with exceptional heart care, we have created designated Provider Care Teams.  These Care Teams include your primary Cardiologist (physician) and Advanced Practice Providers (APPs -  Physician Assistants and Nurse Practitioners) who all work together to provide you with the care you need, when you need it.  We recommend signing up for the patient portal called "MyChart".  Sign up information is provided on this After Visit Summary.  MyChart is used to connect with patients for Virtual Visits (Telemedicine).  Patients are able to view lab/test results, encounter notes, upcoming appointments, etc.  Non-urgent messages can be sent to your provider as well.   To learn more about what you can do with MyChart, go to  [email or web address].    Your next appointment:   1 month(s)  Provider:   You may see Kathlyn Sacramento, MD or one of the following Advanced Practice Providers on your designated Care Team:   Murray Hodgkins, NP [name], PA-C Cadence Kathlen Mody, PA-C Gerrie Nordmann, NP

## 2022-05-28 NOTE — Assessment & Plan Note (Signed)
Pt still having difficulty with getting and staying on all meds, high BP can easily lead to exacerbation - some pitting edema to LE, but no recent/sudden weight changes, orthopnea, PND, CP - seeing cardiology later today

## 2022-05-28 NOTE — Assessment & Plan Note (Addendum)
Last OV in Sept TSH was high and levothyroxine dose increase She has been taking 125 mcg daily  She did not f/up or recheck labs as instructed She feels generally better after the dose increase Due for TSH recheck today

## 2022-05-29 ENCOUNTER — Emergency Department: Payer: Medicaid Other

## 2022-05-29 ENCOUNTER — Emergency Department
Admission: EM | Admit: 2022-05-29 | Discharge: 2022-05-29 | Disposition: A | Discharge status: Home / Self Care | Payer: Medicaid Other | Attending: Emergency Medicine | Admitting: Emergency Medicine

## 2022-05-29 ENCOUNTER — Other Ambulatory Visit: Payer: Self-pay | Admitting: Family Medicine

## 2022-05-29 DIAGNOSIS — I509 Heart failure, unspecified: Secondary | ICD-10-CM | POA: Diagnosis not present

## 2022-05-29 DIAGNOSIS — I10 Essential (primary) hypertension: Secondary | ICD-10-CM | POA: Diagnosis not present

## 2022-05-29 DIAGNOSIS — M7122 Synovial cyst of popliteal space [Baker], left knee: Secondary | ICD-10-CM | POA: Diagnosis not present

## 2022-05-29 DIAGNOSIS — R600 Localized edema: Secondary | ICD-10-CM | POA: Diagnosis not present

## 2022-05-29 DIAGNOSIS — I130 Hypertensive heart and chronic kidney disease with heart failure and stage 1 through stage 4 chronic kidney disease, or unspecified chronic kidney disease: Secondary | ICD-10-CM | POA: Insufficient documentation

## 2022-05-29 DIAGNOSIS — M7989 Other specified soft tissue disorders: Secondary | ICD-10-CM | POA: Diagnosis not present

## 2022-05-29 DIAGNOSIS — E038 Other specified hypothyroidism: Secondary | ICD-10-CM

## 2022-05-29 DIAGNOSIS — N189 Chronic kidney disease, unspecified: Secondary | ICD-10-CM | POA: Diagnosis not present

## 2022-05-29 LAB — CBC WITH DIFFERENTIAL/PLATELET
Absolute Monocytes: 329 cells/uL (ref 200–950)
Basophils Absolute: 61 cells/uL (ref 0–200)
Basophils Relative: 1 %
Eosinophils Absolute: 92 cells/uL (ref 15–500)
Eosinophils Relative: 1.5 %
HCT: 43.4 % (ref 35.0–45.0)
Hemoglobin: 14.2 g/dL (ref 11.7–15.5)
Lymphs Abs: 1556 cells/uL (ref 850–3900)
MCH: 29.2 pg (ref 27.0–33.0)
MCHC: 32.7 g/dL (ref 32.0–36.0)
MCV: 89.3 fL (ref 80.0–100.0)
MPV: 9.3 fL (ref 7.5–12.5)
Monocytes Relative: 5.4 %
Neutro Abs: 4063 cells/uL (ref 1500–7800)
Neutrophils Relative %: 66.6 %
Platelets: 380 10*3/uL (ref 140–400)
RBC: 4.86 10*6/uL (ref 3.80–5.10)
RDW: 14.1 % (ref 11.0–15.0)
Total Lymphocyte: 25.5 %
WBC: 6.1 10*3/uL (ref 3.8–10.8)

## 2022-05-29 LAB — COMPLETE METABOLIC PANEL WITH GFR
AG Ratio: 1.3 (calc) (ref 1.0–2.5)
ALT: 19 U/L (ref 6–29)
AST: 16 U/L (ref 10–35)
Albumin: 3.8 g/dL (ref 3.6–5.1)
Alkaline phosphatase (APISO): 114 U/L (ref 37–153)
BUN/Creatinine Ratio: 19 (calc) (ref 6–22)
BUN: 29 mg/dL — ABNORMAL HIGH (ref 7–25)
CO2: 22 mmol/L (ref 20–32)
Calcium: 9.3 mg/dL (ref 8.6–10.4)
Chloride: 108 mmol/L (ref 98–110)
Creat: 1.5 mg/dL — ABNORMAL HIGH (ref 0.50–1.03)
Globulin: 3 g/dL (calc) (ref 1.9–3.7)
Glucose, Bld: 81 mg/dL (ref 65–99)
Potassium: 3.9 mmol/L (ref 3.5–5.3)
Sodium: 145 mmol/L (ref 135–146)
Total Bilirubin: 0.5 mg/dL (ref 0.2–1.2)
Total Protein: 6.8 g/dL (ref 6.1–8.1)
eGFR: 42 mL/min/{1.73_m2} — ABNORMAL LOW (ref >=60)

## 2022-05-29 LAB — LIPASE: Lipase: 55 U/L (ref 7–60)

## 2022-05-29 LAB — TSH: TSH: 1.88 mIU/L

## 2022-05-29 MED ORDER — CLONIDINE HCL 0.1 MG PO TABS
0.2000 mg | ORAL_TABLET | Freq: Once | ORAL | Status: AC
  Administered 2022-05-29: 0.2 mg via ORAL
  Filled 2022-05-29: qty 2

## 2022-05-29 MED ORDER — ACETAMINOPHEN 325 MG PO TABS
650.0000 mg | ORAL_TABLET | Freq: Once | ORAL | Status: AC
  Administered 2022-05-29: 650 mg via ORAL
  Filled 2022-05-29: qty 2

## 2022-05-29 MED ORDER — LEVOTHYROXINE SODIUM 125 MCG PO TABS: 125.0000 ug | ORAL_TABLET | Freq: Every day | ORAL | 1 refills | Status: DC

## 2022-05-29 MED ORDER — CLONIDINE HCL 0.2 MG PO TABS: 0.2000 mg | ORAL_TABLET | Freq: Two times a day (BID) | ORAL | 11 refills | Status: DC

## 2022-05-29 NOTE — ED Notes (Signed)
EDP made aware of pts complaint, EDP to order [place]

## 2022-05-29 NOTE — ED Notes (Signed)
Dr. Cherylann Banas informed of pts BP. Pt states she took her BP meds today. Pt c/o of a headache. Dr. Cherylann Banas also informed of this. He is going to the pts room to assess the pt

## 2022-05-29 NOTE — ED Notes (Signed)
Called ultrasound, pt is in ultrasound at this time and they will bring to [facility] once complete.

## 2022-05-29 NOTE — Addendum Note (Signed)
Addended by: Delsa Grana on: 05/29/2022 04:26 PM   Modules accepted: Orders

## 2022-05-29 NOTE — ED Triage Notes (Signed)
Pt sent from Dr. Trish Fountain office for DVT rule out to left leg. Pt endorses swelling to left leg for 2 weeks.

## 2022-05-29 NOTE — ED Provider Notes (Signed)
[facility] Provider Note    Event Date/Time   First MD Initiated Contact with Patient 05/29/22 1304     (approximate)   History   Leg Swelling   HPI  Angelica Herrera is a 52 y.o. female with a history of CHF, hypertension, CKD, and arthritis who presents with left leg pain and swelling intermittent over the last 2 weeks, occurring behind the knee and throughout the lower leg.  The patient denies any trauma or injury.  She has no weakness or numbness to the leg.  She has no shortness of breath or chest pain.  She was seen by cardiology and sent into the ED for DVT rule out.  I reviewed the past medical records.  The patient was seen by cardiology yesterday.  At that time her cardiac symptoms were noted to be stable but she reported persistent left leg swelling and had an elevated D-dimer so was advised to come to the ED for DVT rule out.   Physical Exam   Triage Vital Signs: ED Triage Vitals [05/29/22 1231]  Enc Vitals Group     BP (!) 213/144     Pulse Rate 78     Resp 16     Temp 98.2 F (36.8 C)     Temp Source Oral     SpO2 96 %     Weight      Height      Head Circumference      Peak Flow      Pain Score      Pain Loc      Pain Edu?      Excl. in [place]?     Most recent vital signs: Vitals:   05/29/22 1231 05/29/22 1433  BP: (!) 213/144 (!) 188/126  Pulse: 78   Resp: 16   Temp: 98.2 F (36.8 C)   SpO2: 96%      General: Awake, no distress.  CV:  Good peripheral perfusion.  Resp:  Normal effort.  Abd:  No distention.  Other:  Left lower extremity with soft palpable mass behind the left knee.  No significant swelling or tenderness.  No erythema or induration.  Full range of motion at the knee and ankle.   ED Results / Procedures / Treatments   Labs (all labs ordered are listed, but only abnormal results are displayed) Labs Reviewed - No data to display   EKG     RADIOLOGY  US venous LLE: I independently viewed and  interpreted the images; there is no evidence of acute DVT.  Radiology report indicates a Baker's cyst.  PROCEDURES:  Critical Care performed: No  Procedures   MEDICATIONS ORDERED IN ED: Medications  acetaminophen (TYLENOL) tablet 650 mg (650 mg Oral Given 05/29/22 1447)  cloNIDine (CATAPRES) tablet 0.2 mg (0.2 mg Oral Given 05/29/22 1459)     IMPRESSION / MDM / ASSESSMENT AND PLAN / ED COURSE  I reviewed the triage vital signs and the nursing notes.  52 year old female with PMH as noted above presents for left leg pain for the last few weeks and DVT rule out after labs yesterday showed an elevated D-dimer.  Differential diagnosis includes, but is not limited to, DVT, peripheral edema, Baker's cyst, other benign etiology.  We will obtain ultrasound for further evaluation.  Patient's presentation is most consistent with acute complicated illness / injury requiring diagnostic workup.  ----------------------------------------- 3:12 PM on 05/29/2022 -----------------------------------------  Ultrasound shows findings consistent with a Baker's cyst which  is compatible with the patient's exam.  There is no evidence of DVT.  Her blood pressure is somewhat elevated.  She has a mild headache but denies chest pain, shortness of breath, or other acute symptoms.  There is no evidence of end organ dysfunction and no indication for further workup.  We will give a p.o. clonidine and Tylenol; I anticipate discharge home when the patient's blood pressure is improved.  ----------------------------------------- 3:50 PM on 05/29/2022 -----------------------------------------  Headache is resolved.  Blood pressure is still elevated.  The patient has known chronic high blood pressure which is being treated.  Given that she is asymptomatic at this time, there is no clinical indication for acute intervention and she is appropriate for discharge.  The patient herself feels comfortable and would like to go  home.  I counseled her on the results of the workup and plan of care.  I gave her strict return precautions and she expressed understanding.  FINAL CLINICAL IMPRESSION(S) / ED DIAGNOSES   Final diagnoses:  Synovial cyst of left popliteal space  Hypertension, unspecified type     Rx / DC Orders   ED Discharge Orders          Ordered    cloNIDine (CATAPRES) 0.2 MG tablet  2 times daily,   Status:  Discontinued        05/29/22 1442             Note:  This document was prepared using Dragon voice recognition software and may include unintentional dictation errors.    Arta Silence, MD 05/29/22 [id number]

## 2022-05-29 NOTE — ED Notes (Signed)
Pt not in hallway at this time

## 2022-05-29 NOTE — ED Notes (Addendum)
Pt is hypertensive, Dr. Ellender Hose informed of the pts BP. Dr. Bland Span is okay with the pt being discharged with a blood pressure this high because she normally is high. Pt states that she will take blood pressure medications at home.

## 2022-05-30 ENCOUNTER — Telehealth: Payer: Self-pay | Admitting: Family Medicine

## 2022-05-30 ENCOUNTER — Ambulatory Visit: Payer: Self-pay

## 2022-05-30 NOTE — Telephone Encounter (Signed)
Chief Complaint: Blood in urine Symptoms: Lower back pain, 5/10, frequency and not urinating a lot when goes Frequency: Blood once a couple of days ago, none since Pertinent Negatives: Patient denies fever Disposition: []$ ED /[]$ Urgent Care (no appt availability in office) / [x]$ Appointment(In office/virtual)/ []$  [place] [facility]/ []$ Home Care/ []$ Refused Recommended Disposition /[]$ [place] [facility]/ []$  Follow-up with PCP Additional Notes: N/A    Reason for Disposition  Blood in urine  (Exception: Could be normal menstrual bleeding.)  Answer Assessment - Initial Assessment Questions 1. COLOR of URINE: "Describe the color of the urine."  (e.g., tea-colored, pink, red, bloody) "Do you have blood clots in your urine?" (e.g., none, pea, grape, small coin)     Bright red 2. ONSET: "When did the bleeding start?"      A couple days ago only once 3. EPISODES: "How many times has there been blood in the urine?" or "How many times today?"     Once 4. PAIN with URINATION: "Is there any pain with passing your urine?" If Yes, ask: "How bad is the pain?"  (Scale 1-10; or mild, moderate, severe)    - MILD: Complains slightly about urination hurting.    - MODERATE: Interferes with normal activities.      - SEVERE: Excruciating, unwilling or unable to urinate because of the pain.      Yes sometimes 5 5. FEVER: "Do you have a fever?" If Yes, ask: "What is your temperature, how was it measured, and when did it start?"     No 6. ASSOCIATED SYMPTOMS: "Are you passing urine more frequently than usual?"     Yes 7. OTHER SYMPTOMS: "Do you have any other symptoms?" (e.g., back/flank pain, abdomen pain, vomiting)     Back pain to the left  Protocols used: Urine - Blood In-A-AH

## 2022-05-30 NOTE — Telephone Encounter (Signed)
See result notes, given.

## 2022-05-30 NOTE — Telephone Encounter (Signed)
Pt returned call for lab results. Pt requests call back. Cb# [phone]

## 2022-05-31 ENCOUNTER — Ambulatory Visit (INDEPENDENT_AMBULATORY_CARE_PROVIDER_SITE_OTHER): Payer: Medicaid Other | Admitting: Family Medicine

## 2022-05-31 ENCOUNTER — Encounter: Payer: Self-pay | Admitting: Family Medicine

## 2022-05-31 VITALS — BP 170/102 | HR 82 | Temp 98.1°F | Resp 16 | Ht 67.0 in | Wt 194.2 lb

## 2022-05-31 DIAGNOSIS — M545 Low back pain, unspecified: Secondary | ICD-10-CM | POA: Diagnosis not present

## 2022-05-31 DIAGNOSIS — R319 Hematuria, unspecified: Secondary | ICD-10-CM

## 2022-05-31 DIAGNOSIS — N390 Urinary tract infection, site not specified: Secondary | ICD-10-CM

## 2022-05-31 LAB — POCT URINALYSIS DIPSTICK
Bilirubin, UA: NEGATIVE
Glucose, UA: NEGATIVE
Ketones, UA: NEGATIVE
Nitrite, UA: POSITIVE
Protein, UA: POSITIVE — AB
Spec Grav, UA: 1.01 (ref 1.010–1.025)
Urobilinogen, UA: 0.2 E.U./dL
pH, UA: 6 (ref 5.0–8.0)

## 2022-05-31 MED ORDER — ONDANSETRON 4 MG PO TBDP: 4.0000 mg | ORAL_TABLET | Freq: Three times a day (TID) | ORAL | 0 refills | Status: DC | PRN

## 2022-05-31 MED ORDER — CEFTRIAXONE SODIUM 500 MG IJ SOLR
1000.0000 mg | Freq: Once | INTRAMUSCULAR | Status: AC
  Administered 2022-05-31: 1000 mg via INTRAMUSCULAR

## 2022-05-31 MED ORDER — LIDOCAINE HCL (PF) 1 % IJ SOLN
4.0000 mL | Freq: Once | INTRAMUSCULAR | Status: AC
  Administered 2022-05-31: 4 mL via INTRADERMAL

## 2022-05-31 MED ORDER — CEPHALEXIN 500 MG PO CAPS: 500.0000 mg | ORAL_CAPSULE | Freq: Four times a day (QID) | ORAL | 0 refills | Status: DC

## 2022-05-31 NOTE — Progress Notes (Signed)
Patient ID: Angelica Herrera, female    DOB: 09-23-1970, 52 y.o.   MRN: [id number]  PCP: Delsa Grana, PA-C  Chief Complaint  Patient presents with   Urinary Frequency   Hematuria   Back Pain    Low back pain onset several days    Subjective:   Angelica Herrera is a 52 y.o. female, presents to clinic with CC of the following:  HPI  Urinary frequency, blood in urine, urgency and some back pain onset a few days ago No hx of recurrent UTI's  Some nausea - hasn't gotten meds from the other day yet  Results for orders placed or performed in visit on 05/31/22  POCT urinalysis dipstick  Result Value Ref Range   Color, UA Yellow    Clarity, UA Clear    Glucose, UA Negative Negative   Bilirubin, UA Negative    Ketones, UA Negative    Spec Grav, UA 1.010 1.010 - 1.025   Blood, UA Large    pH, UA 6.0 5.0 - 8.0   Protein, UA Positive (A) Negative   Urobilinogen, UA 0.2 0.2 or 1.0 E.U./dL   Nitrite, UA Positive    Leukocytes, UA Moderate (2+) (A) Negative   Appearance Yellow    Odor Foul    N no vomiting Urinary started 3+ days ago, had abd pain previously see last OV, pain then was upper abd Her lower abd pain worsened, blood in urine gross blood 2 episodes - one a few days ago and this am Back pain low to mid back both sides Did not get her GI meds yet from the pharmacy     Patient Active Problem List   Diagnosis Date Noted   Elevated hemoglobin ([place]) 05/28/2022   History of pancreatitis 05/28/2022   Lack of access to transportation 12/27/2021   Arthritis 09/04/2021   Chronic combined systolic (congestive) and diastolic (congestive) heart failure ([place]) 09/04/2021   Pancreatitis 09/04/2021   Stage 3b chronic kidney disease ([place]) 02/18/2020   Anxiety with depression 02/18/2020   Tobacco dependence 02/18/2020   Rhinosinusitis 09/02/2019   Chronic obstructive pulmonary disease ([place]) 09/02/2019   Squamous cell carcinoma of back 04/20/2019   Accelerated  hypertension 11/13/2018   Chest pain 11/12/2018   S/P primary angioplasty with coronary stent 03/04/2018   Depression, recurrent ([place]) [id number]   Chronic systolic heart failure ([place]) 11/21/2017   Controlled substance agreement signed 09/07/2015   Coronary artery disease involving native coronary artery    Allergic rhinitis due to pollen 06/28/2015   GAD (generalized anxiety disorder) 03/17/2015   Degenerative disc disease, cervical 03/17/2015   Hyperlipidemia    Obesity    CKD (chronic kidney disease), stage IV (HCC)    Ischemic cardiomyopathy    Hypothyroidism    History of acute anterior wall MI 02/12/2012   Essential hypertension, malignant 02/12/2012   Tobacco abuse 02/12/2012      Current Outpatient Medications:    acetaminophen (TYLENOL) 500 MG tablet, Take 1,000 mg by mouth every 6 (six) hours as needed., Disp: , Rfl:    aspirin 81 MG tablet, Take 1 tablet (81 mg total) by mouth daily., Disp: 30 tablet, Rfl:    atorvastatin (LIPITOR) 80 MG tablet, TAKE ONE TABLET BY MOUTH AT BEDTIME., Disp: 30 tablet, Rfl: 0   busPIRone (BUSPAR) 5 MG tablet, Take 1-3 tablets (5-15 mg total) by mouth 2 (two) times daily as needed (anxiety, irritability)., Disp: 120 tablet, Rfl: 2   carvedilol (COREG) 25 MG  tablet, Take 1 tablet (25 mg total) by mouth 2 (two) times daily., Disp: 60 tablet, Rfl: 1   cephALEXin (KEFLEX) 500 MG capsule, Take 1 capsule (500 mg total) by mouth 4 (four) times daily for 5 days., Disp: 20 capsule, Rfl: 0   cetirizine (ZYRTEC) 10 MG tablet, TAKE (1) TABLET BY MOUTH DAILY AT BEDTIME, Disp: 30 tablet, Rfl: 11   clopidogrel (PLAVIX) 75 MG tablet, TAKE (1) TABLET BY MOUTH EVERY DAY, Disp: 90 tablet, Rfl: 0   colchicine 0.6 MG tablet, Take 1 tablet (0.6 mg total) by mouth daily as needed (gout)., Disp: 15 tablet, Rfl: 0   ENTRESTO 24-26 MG, TAKE ONE TABLET BY MOUTH TWICE DAILY., Disp: 60 tablet, Rfl: 5   famotidine (PEPCID) 20 MG tablet, Take 1 tablet (20 mg total) by mouth  2 (two) times daily as needed for heartburn or indigestion., Disp: 60 tablet, Rfl: 2   furosemide (LASIX) 20 MG tablet, TAKE (1) TABLET BY MOUTH EVERY DAY, Disp: 90 tablet, Rfl: 0   gabapentin (NEURONTIN) 300 MG capsule, TAKE (1) CAPSULE BY MOUTH TWICE DAILY, Disp: 180 capsule, Rfl: 0   levothyroxine (SYNTHROID) 125 MCG tablet, Take 1 tablet (125 mcg total) by mouth daily before breakfast., Disp: 90 tablet, Rfl: 1   nicotine (NICODERM CQ - DOSED IN MG/24 HOURS) 14 mg/24hr patch, Place 1 patch (14 mg total) onto the skin daily., Disp: 28 patch, Rfl: 1   nitroGLYCERIN (NITROSTAT) 0.4 MG SL tablet, Place 1 tablet (0.4 mg total) under the tongue every 5 (five) minutes x 3 doses as needed for chest pain., Disp: 25 tablet, Rfl: 3   ondansetron (ZOFRAN-ODT) 4 MG disintegrating tablet, Take 1 tablet (4 mg total) by mouth every 8 (eight) hours as needed for nausea or vomiting., Disp: 20 tablet, Rfl: 0   pantoprazole (PROTONIX) 40 MG tablet, Take 1 tablet (40 mg total) by mouth daily. Empty stomach - take more than 4 hours after taking levothyroxine, Disp: 30 tablet, Rfl: 3   PARoxetine (PAXIL) 40 MG tablet, TAKE (1) TABLET BY MOUTH EVERY DAY, Disp: 90 tablet, Rfl: 1   sacubitril-valsartan (ENTRESTO) 24-26 MG, Take 1 tablet by mouth 2 (two) times daily., Disp: 180 tablet, Rfl: 2  Current Facility-Administered Medications:    mometasone-formoterol (DULERA) 200-5 MCG/ACT inhaler 2 puff, 2 puff, Inhalation, BID, Devine Klingel, PA-C   No Known Allergies   Social History   Tobacco Use   Smoking status: Every Day    Packs/day: 0.25    Years: 30.00    Total pack years: 7.50    Types: Cigarettes   Smokeless tobacco: Never  Vaping Use   Vaping Use: Never used  Substance Use Topics   Alcohol use: Not Currently    Comment: Says she previously drank socially - never a heavy drinker.  No etoh since 08/2018.   Drug use: No    Types: Marijuana      Chart Review Today: I personally reviewed active problem  list, medication list, allergies, family history, social history, health maintenance, notes from last encounter, lab results, imaging with the patient/caregiver today.   Review of Systems  Constitutional: Negative.   HENT: Negative.    Eyes: Negative.   Respiratory: Negative.    Cardiovascular: Negative.   Gastrointestinal: Negative.   Endocrine: Negative.   Genitourinary: Negative.   Musculoskeletal: Negative.   Skin: Negative.   Allergic/Immunologic: Negative.   Neurological: Negative.   Hematological: Negative.   Psychiatric/Behavioral: Negative.    All other systems reviewed and  are negative.      Objective:   Vitals:   05/31/22 1108 05/31/22 1156  BP: (!) 178/100 (!) 170/102  Pulse: 82   Resp: 16   Temp: 98.1 F (36.7 C)   TempSrc: Oral   SpO2: 97%   Weight: 194 lb 3.2 oz (88.1 kg)   Height: 5' 7"$  (1.702 m)     Body mass index is 30.42 kg/m.  Physical Exam Vitals and nursing note reviewed.  Constitutional:      General: She is not in acute distress.    Appearance: Normal appearance. She is well-developed. She is obese. She is not ill-appearing, toxic-appearing or diaphoretic.  HENT:     Head: Normocephalic and atraumatic.     Nose: Nose normal.  Eyes:     General:        Right eye: No discharge.        Left eye: No discharge.     Conjunctiva/sclera: Conjunctivae normal.  Neck:     Trachea: No tracheal deviation.  Cardiovascular:     Rate and Rhythm: Normal rate and regular rhythm.     Pulses: Normal pulses.     Heart sounds: Murmur heard.  Pulmonary:     Effort: Pulmonary effort is normal. No respiratory distress.     Breath sounds: Normal breath sounds. No stridor.  Abdominal:     General: Bowel sounds are normal. There is no distension.     Palpations: Abdomen is soft.     Tenderness: There is abdominal tenderness. There is right CVA tenderness and left CVA tenderness. There is no guarding or rebound.  Musculoskeletal:        General: Normal  range of motion.  Skin:    General: Skin is warm and dry.     Findings: No rash.  Neurological:     Mental Status: She is alert.     Motor: No abnormal muscle tone.     Coordination: Coordination normal.  Psychiatric:        Mood and Affect: Mood normal.        Behavior: Behavior normal.      Results for orders placed or performed in visit on 05/31/22  POCT urinalysis dipstick  Result Value Ref Range   Color, UA Yellow    Clarity, UA Clear    Glucose, UA Negative Negative   Bilirubin, UA Negative    Ketones, UA Negative    Spec Grav, UA 1.010 1.010 - 1.025   Blood, UA Large    pH, UA 6.0 5.0 - 8.0   Protein, UA Positive (A) Negative   Urobilinogen, UA 0.2 0.2 or 1.0 E.U./dL   Nitrite, UA Positive    Leukocytes, UA Moderate (2+) (A) Negative   Appearance Yellow    Odor Foul        Assessment & Plan:   Pt was seen the other day for routine f up and abd pain, she returns today with new sx, gross hematuria and dysuria x 3+ days.  Abd pain is worse than the other day with flank pain - generalized ttp on exam, CVA ttp bilaterally.  She is a little nauseated but has not thrown up. 1 g rocephin for UTI today in office Zofran sent into her pharmacy Also urged her to get the PPI and pepcid to help settle stomach Empiric coverage with keflex Push fluids Urine sent for culture  Concern for pyelo/complicated UTI but pt is nontoxic-appearing hemodynamically stable and she will try oral antibiotics first we  did discuss ER or urgent care precautions -namely worsening severe abdominal pain with inability to tolerate Po and take abx.  She verbalized understanding    ICD-10-CM   1. Urinary tract infection with hematuria, site unspecified  N39.0 ondansetron (ZOFRAN-ODT) 4 MG disintegrating tablet   R31.9 cephALEXin (KEFLEX) 500 MG capsule    cefTRIAXone (ROCEPHIN) injection 1,000 mg    lidocaine (PF) (XYLOCAINE) 1 % injection 4 mL    2. Hematuria, unspecified type  R31.9 POCT  urinalysis dipstick    Urine Culture   dip positive for blood, leukocytes, nitrites - consistent with UTI, sent for culture    3. Low back pain, unspecified back pain laterality, unspecified chronicity, unspecified whether sciatica present  M54.50 POCT urinalysis dipstick    Urine Culture   more consistent with flank pain b/l     F/up if not improving in 2-3 d     Delsa Grana, PA-C 05/31/22 9:24 PM

## 2022-06-02 LAB — URINE CULTURE
MICRO NUMBER:: 14576409
SPECIMEN QUALITY:: ADEQUATE

## 2022-06-03 ENCOUNTER — Other Ambulatory Visit: Payer: Self-pay | Admitting: Family Medicine

## 2022-06-03 MED ORDER — NITROFURANTOIN MONOHYD MACRO 100 MG PO CAPS: 100.0000 mg | ORAL_CAPSULE | Freq: Two times a day (BID) | ORAL | 0 refills | Status: DC

## 2022-06-03 NOTE — Addendum Note (Signed)
Addended by: Delsa Grana on: 06/03/2022 02:44 PM   Modules accepted: Orders

## 2022-06-04 ENCOUNTER — Telehealth: Payer: Self-pay | Admitting: Cardiovascular Disease

## 2022-06-04 ENCOUNTER — Ambulatory Visit
Admission: RE | Admit: 2022-06-04 | Discharge: 2022-06-04 | Disposition: A | Discharge status: Home / Self Care | Payer: Medicaid Other | Source: Ambulatory Visit | Attending: Family Medicine | Admitting: Family Medicine

## 2022-06-04 DIAGNOSIS — R1010 Upper abdominal pain, unspecified: Secondary | ICD-10-CM

## 2022-06-04 DIAGNOSIS — Z8719 Personal history of other diseases of the digestive system: Secondary | ICD-10-CM

## 2022-06-04 DIAGNOSIS — I5022 Chronic systolic (congestive) heart failure: Secondary | ICD-10-CM

## 2022-06-04 DIAGNOSIS — R109 Unspecified abdominal pain: Secondary | ICD-10-CM | POA: Diagnosis not present

## 2022-06-04 NOTE — Telephone Encounter (Signed)
Returned the call to the patient. She stated that she went to the ED on 2/14 with hypertension. They prescribed her Clonidine 0.2 mg bid but then this was discontinued. She is currently taking the Carvedilol 25 mg bid and blood pressures are still elevated.  Thursday: 177/122 Saturday: 181/117 HR 77 Sunday: 171/110 HR 73  She wants to know if she can take the Clonidine 0.2 mg bid as well.

## 2022-06-04 NOTE — Telephone Encounter (Signed)
Pt c/o medication issue:  1. Name of Medication:   carvedilol (COREG) 25 MG tablet  cloNIDine (CATAPRES) 0.2 MG table   2. How are you currently taking this medication (dosage and times per day)?   As prescribed  3. Are you having a reaction (difficulty breathing--STAT)?   No  4. What is your medication issue?   Patient stated she has not been taking the Clonidine.  Patient wants to know if she should cut back on the Carvedilol.  Patient states the Carvedilol makes her feel as though she has no energy and a little difficulty breathing.

## 2022-06-06 NOTE — Telephone Encounter (Signed)
Left a message for the patient to call back.  

## 2022-06-06 NOTE — Addendum Note (Signed)
Addended by: James Ivanoff D on: 06/06/2022 08:35 AM   Modules accepted: Orders

## 2022-06-06 NOTE — Telephone Encounter (Signed)
I do not recommend resuming clonidine. Recommend increasing Entresto to 49/51 mg twice daily.  Check basic metabolic profile 1 week after increasing the dose.

## 2022-06-10 MED ORDER — SACUBITRIL-VALSARTAN 49-51 MG PO TABS: 1.0000 | ORAL_TABLET | Freq: Two times a day (BID) | ORAL | 11 refills | Status: DC

## 2022-06-10 NOTE — Telephone Encounter (Signed)
Patient has been made aware. Entresto 49/51 mg twice daily.

## 2022-06-14 DIAGNOSIS — Z419 Encounter for procedure for purposes other than remedying health state, unspecified: Secondary | ICD-10-CM | POA: Diagnosis not present

## 2022-06-18 ENCOUNTER — Encounter: Payer: Self-pay | Admitting: Cardiovascular Disease

## 2022-06-18 ENCOUNTER — Telehealth: Payer: Self-pay | Admitting: Cardiovascular Disease

## 2022-06-18 NOTE — Progress Notes (Signed)
Samples of Entresto 24/26 mg tablets placed at our front desk for patient pick up on 05/17/22. The patient has not retrieved these as of today.  Reviewed with Dr. Tyrell Antonio nurse, Cristopher Peru.- the patient's dose has recently been increased to Entresto 49/51 mg- she will not need the 24/26 mg samples. ' Ok to return to inventory.  Returning to sample inventory: Entresto 24/26 mg Lot [id number] Exp: 10/2023 # 2 bottles

## 2022-06-18 NOTE — Telephone Encounter (Signed)
Pt c/o BP issue: STAT if pt c/o blurred vision, one-sided weakness or slurred speech  1. What are your last 5 BP readings?  3/03: 181/125 83 3/04: 174/120 75 3/05: 191/134 78  2. Are you having any other symptoms (ex. Dizziness, headache, blurred vision, passed out)?  Weakness especially in upper limbs, overall just feels bad  3. What is your BP issue?  BP is elevated

## 2022-06-18 NOTE — Telephone Encounter (Signed)
Returned the call to the patient. She had her Entresto increased on 2/26 and was calling to state that her pressures have still be elevated.   She takes: Entresto 49/51 mg bid Carvedilol 12.5 mg bid (she stated that the 25 mg bid made her feel poorly)  She did try the Clonidine 0.2 once daily for three days but this did not help lower her blood pressure.   Blood pressures after medication: 3/03: 181/125  83 3/04: 174/120  75 3/05: 191/134  78  Ed precautions have been given for worsening symptoms. As of today, she does feel weak and fatigued but stated that it is not any worse that it has been.

## 2022-06-19 NOTE — Telephone Encounter (Signed)
Check basic metabolic profile.  If renal function remains stable, we will plan on increasing Entresto.

## 2022-06-19 NOTE — Telephone Encounter (Signed)
Spoke with the patient. She will try and come tomorrow for labs.

## 2022-07-02 ENCOUNTER — Ambulatory Visit: Payer: Medicaid Other | Attending: Physician Assistant

## 2022-07-02 DIAGNOSIS — I701 Atherosclerosis of renal artery: Secondary | ICD-10-CM

## 2022-07-04 ENCOUNTER — Encounter: Payer: Self-pay | Admitting: Cardiovascular Disease

## 2022-07-04 ENCOUNTER — Other Ambulatory Visit: Payer: Self-pay | Admitting: Family Medicine

## 2022-07-04 ENCOUNTER — Ambulatory Visit: Payer: Medicaid Other | Attending: Cardiovascular Disease | Admitting: Cardiovascular Disease

## 2022-07-04 ENCOUNTER — Other Ambulatory Visit: Payer: Self-pay | Admitting: Cardiovascular Disease

## 2022-07-04 VITALS — BP 140/90 | HR 58 | Ht 66.0 in | Wt 200.0 lb

## 2022-07-04 DIAGNOSIS — I25118 Atherosclerotic heart disease of native coronary artery with other forms of angina pectoris: Secondary | ICD-10-CM | POA: Diagnosis not present

## 2022-07-04 DIAGNOSIS — I2510 Atherosclerotic heart disease of native coronary artery without angina pectoris: Secondary | ICD-10-CM

## 2022-07-04 DIAGNOSIS — Z720 Tobacco use: Secondary | ICD-10-CM

## 2022-07-04 DIAGNOSIS — I10 Essential (primary) hypertension: Secondary | ICD-10-CM

## 2022-07-04 DIAGNOSIS — E785 Hyperlipidemia, unspecified: Secondary | ICD-10-CM | POA: Diagnosis not present

## 2022-07-04 DIAGNOSIS — F411 Generalized anxiety disorder: Secondary | ICD-10-CM

## 2022-07-04 DIAGNOSIS — I5023 Acute on chronic systolic (congestive) heart failure: Secondary | ICD-10-CM

## 2022-07-04 DIAGNOSIS — E038 Other specified hypothyroidism: Secondary | ICD-10-CM

## 2022-07-04 DIAGNOSIS — F3341 Major depressive disorder, recurrent, in partial remission: Secondary | ICD-10-CM

## 2022-07-04 MED ORDER — POTASSIUM CHLORIDE CRYS ER 20 MEQ PO TBCR: 20.0000 meq | EXTENDED_RELEASE_TABLET | Freq: Every day | ORAL | 3 refills | Status: DC

## 2022-07-04 MED ORDER — TORSEMIDE 40 MG PO TABS: 40.0000 mg | ORAL_TABLET | Freq: Every day | ORAL | 3 refills | Status: DC

## 2022-07-04 MED ORDER — SACUBITRIL-VALSARTAN 97-103 MG PO TABS: 1.0000 | ORAL_TABLET | Freq: Two times a day (BID) | ORAL | 11 refills | Status: DC

## 2022-07-04 NOTE — Patient Instructions (Addendum)
Medication Instructions:  STOP the Clonidine STOP the Furosemide  START Torsemide 40 mg once daily START Potassium 20 mEq daily  INCREASE the Entresto to 97-103 mg twice daily  *If you need a refill on your cardiac medications before your next appointment, please call your pharmacy*   Lab Work: Your provider would like for you to return in one week to have the following labs drawn: BMET.   Please go to the [facility] entrance and check in at the front desk.  You do not need an appointment.  They are open from 7am-6 pm.  You will not need to be fasting.  If you have labs (blood work) drawn today and your tests are completely normal, you will receive your results only by: [place] (if you have MyChart) OR A paper copy in the mail If you have any lab test that is abnormal or we need to change your treatment, we will call you to review the results.   Testing/Procedures: None ordered   Follow-Up: At [facility], you and your health needs are our priority.  As part of our continuing mission to provide you with exceptional heart care, we have created designated Provider Care Teams.  These Care Teams include your primary Cardiologist (physician) and Advanced Practice Providers (APPs -  Physician Assistants and Nurse Practitioners) who all work together to provide you with the care you need, when you need it.  We recommend signing up for the patient portal called "MyChart".  Sign up information is provided on this After Visit Summary.  MyChart is used to connect with patients for Virtual Visits (Telemedicine).  Patients are able to view lab/test results, encounter notes, upcoming appointments, etc.  Non-urgent messages can be sent to your provider as well.   To learn more about what you can do with MyChart, go to [email or web address].    Your next appointment:   Follow up in one month with [name], PA

## 2022-07-04 NOTE — Progress Notes (Signed)
Cardiology Office Note   Date:  07/04/2022   ID:  Angelica Herrera, DOB 29-Jan-1971, MRN [id number]  PCP:  Delsa Grana, PA-C  Cardiologist:   Kathlyn Sacramento, MD  Nephrologist: Dr. Rolly Salter  Chief Complaint  Patient presents with   Other    Pt not feeling well feels like she needs to be admitted C/o weight retention, sob, and elevated BP. Meds reviewed verbally with pt.       History of Present Illness: Angelica Herrera is a 52 y.o. female who for a follow-up visit regarding coronary artery disease and chronic systolic heart failure.  She presented in October 2013 with acute anterolateral ST elevation myocardial infarction. She underwent emergent cardiac catheterization  which showed flush occlusion of left main coronary artery. There was significant difficulty in engaging the left main. Left main balloon angioplasty followed by drug-eluting stent placement was performed. She also had balloon angioplasty done to the LAD due to embolization from the left main. The patient had significant hemodynamic instability with ventricular tachycardia and hypotension.   She is a smoker. She had atypical chest pain in 2017. A pharmacologic nuclear stress test  showed prior anterior infarct with mild peri-infarct ischemia and  ejection fraction of 33%.  Cardiac catheterization at that time showed patent left main stent with moderate mid LAD stenosis and occluded second diagonal. Ejection fraction was 35-40% with anterior wall hypokinesis. She has history of renal artery stenosis status post left renal artery stenting in August of 2020 by Dr. Delana Meyer..  In addition, she has chronic kidney disease, hyperlipidemia and tobacco use.  Echocardiogram in November of 2020 showed an EF of 35 to 40% with moderate pulmonary hypertension.  She has intermittent periods of noncompliance with medications causing elevated blood pressure decompensated heart failure.  She was seen by [name] last month and she  was compliant with her medications at that time.  She was noted to be hypertensive and thus the dose of carvedilol was increased to 25 mg twice daily.  She ran out of Entresto 3 days ago and has been taking clonidine 0.1 mg twice daily instead.  She noticed increased shortness of breath and lower extremity edema over the last month with 10 pounds of weight gain.  She has gained weight gradually.  Her previous weight was in the 170 pounds range.  She has been taking furosemide 20 mg once daily but has not been effective.  She even the doubled the dose to 40 mg for few days but there was no significant change.  She does report worsening chest pain as well.   Past Medical History:  Diagnosis Date   Accelerated hypertension 11/13/2018   Allergic rhinitis due to pollen 06/28/2015   Anxiety with depression 02/18/2020   Arthritis    gout   Chest pain 11/12/2018   Chronic combined systolic (congestive) and diastolic (congestive) heart failure ([place])    a. 02/2017 Echo: EF 30-35%, diff HK, sev HK/poss AK of ant and lat walls. Gr1 DD. Mild to mod MR. Nl RV fxn. Mod TR.   Chronic obstructive pulmonary disease ([place]) 09/02/2019   tx with trelegy and SABA   Chronic systolic heart failure ([place]) 11/21/2017   CKD (chronic kidney disease), stage IV ([place])    Controlled substance agreement signed 09/07/2015   Coronary artery disease involving native coronary artery    Degenerative disc disease, cervical 03/17/2015   Depression, recurrent ([place]) 12/02/2017   Essential hypertension, malignant 02/12/2012   GAD (generalized anxiety  disorder) 03/17/2015   History of acute anterior wall MI 02/12/2012   Hyperlipidemia    Hypothyroidism    Ischemic cardiomyopathy    a. 02/2012 Echo: EF 40-45%, ant/lat HK, Gr1 DD, Mild MR, PASP 51mmHg; b. 02/2017 Echo: EF 30-35%.   NSVT (nonsustained ventricular tachycardia) ([place])    a. 02/2012 post-MI   Obesity    Pancreatitis    Rhinosinusitis 09/02/2019   S/P primary angioplasty with  coronary stent 03/04/2018   Dual antiplatelet therapy INDEFINITELY per cardiology note Nov 2019   Squamous cell carcinoma of back 04/20/2019   referrred to sugery for f/up excision, derm not available for several month, lesion with rapid change cannot wait, still see derm for skin survey etc   Stage 3b chronic kidney disease ([place]) 02/18/2020   Tobacco abuse    Tobacco dependence 02/18/2020    Past Surgical History:  Procedure Laterality Date   ABDOMINAL HYSTERECTOMY     CARDIAC CATHETERIZATION  2013   [facility] s/p stent   CARDIAC CATHETERIZATION Left 08/03/2015   Procedure: Left Heart Cath and Coronary Angiography;  Surgeon: Wellington Hampshire, MD;  Location: [facility];  Service: Cardiovascular;  Laterality: Left;   DILATION AND CURETTAGE OF UTERUS     LEFT HEART CATHETERIZATION WITH CORONARY ANGIOGRAM N/A 02/11/2012   Procedure: LEFT HEART CATHETERIZATION WITH CORONARY ANGIOGRAM;  Surgeon: [name], MD;  Location: [facility] CATH LAB;  Service: Cardiovascular;  Laterality: N/A;   RENAL INTERVENTION N/A 11/17/2018   Procedure: RENAL INTERVENTION;  Surgeon: Katha Cabal, MD;  Location: [place] [facility];  Service: Cardiovascular;  Laterality: N/A;     Current Outpatient Medications  Medication Sig Dispense Refill   acetaminophen (TYLENOL) 500 MG tablet Take 1,000 mg by mouth every 6 (six) hours as needed.     aspirin 81 MG tablet Take 1 tablet (81 mg total) by mouth daily. 30 tablet    atorvastatin (LIPITOR) 80 MG tablet TAKE ONE TABLET BY MOUTH AT BEDTIME. 30 tablet 0   busPIRone (BUSPAR) 5 MG tablet Take 1-3 tablets (5-15 mg total) by mouth 2 (two) times daily as needed (anxiety, irritability). 120 tablet 2   carvedilol (COREG) 25 MG tablet Take 1 tablet (25 mg total) by mouth 2 (two) times daily. (Patient taking differently: Take 12.5 mg by mouth 2 (two) times daily.) 60 tablet 1   cetirizine (ZYRTEC) 10 MG tablet TAKE (1) TABLET BY MOUTH DAILY AT BEDTIME 30 tablet 11    cloNIDine (CATAPRES) 0.2 MG tablet Take 0.2 mg by mouth 2 (two) times daily.     clopidogrel (PLAVIX) 75 MG tablet TAKE (1) TABLET BY MOUTH EVERY DAY 90 tablet 0   colchicine 0.6 MG tablet Take 1 tablet (0.6 mg total) by mouth daily as needed (gout). 15 tablet 0   famotidine (PEPCID) 20 MG tablet Take 1 tablet (20 mg total) by mouth 2 (two) times daily as needed for heartburn or indigestion. 60 tablet 2   furosemide (LASIX) 20 MG tablet TAKE (1) TABLET BY MOUTH EVERY DAY 90 tablet 0   gabapentin (NEURONTIN) 300 MG capsule TAKE (1) CAPSULE BY MOUTH TWICE DAILY 180 capsule 0   levothyroxine (SYNTHROID) 125 MCG tablet Take 1 tablet (125 mcg total) by mouth daily before breakfast. 90 tablet 1   nitroGLYCERIN (NITROSTAT) 0.4 MG SL tablet Place 1 tablet (0.4 mg total) under the tongue every 5 (five) minutes x 3 doses as needed for chest pain. 25 tablet 3   ondansetron (ZOFRAN-ODT) 4 MG  disintegrating tablet Take 1 tablet (4 mg total) by mouth every 8 (eight) hours as needed for nausea or vomiting. 20 tablet 0   pantoprazole (PROTONIX) 40 MG tablet Take 1 tablet (40 mg total) by mouth daily. Empty stomach - take more than 4 hours after taking levothyroxine 30 tablet 3   PARoxetine (PAXIL) 40 MG tablet TAKE (1) TABLET BY MOUTH EVERY DAY 90 tablet 1   sacubitril-valsartan (ENTRESTO) 49-51 MG Take 1 tablet by mouth 2 (two) times daily. (Patient not taking: Reported on 07/04/2022) 60 tablet 11   Current Facility-Administered Medications  Medication Dose Route Frequency Provider Last Rate Last Admin   mometasone-formoterol (DULERA) 200-5 MCG/ACT inhaler 2 puff  2 puff Inhalation BID Delsa Grana, PA-C        Allergies:   Patient has no known allergies.    Social History:  The patient  reports that she has been smoking cigarettes. She has a 7.50 pack-year smoking history. She has never used smokeless tobacco. She reports that she does not currently use alcohol. She reports that she does not use drugs.    Family History:  The patient's family history includes AAA (abdominal aortic aneurysm) in her father; Cancer in her paternal grandfather; Diabetes in her mother; Hyperlipidemia in her mother; Hypertension in her brother, brother, daughter, father, mother, sister, and sister; Kidney disease in her mother; Supraventricular tachycardia in her sister; Thyroid disease in her father.    ROS:  Please see the history of present illness.   Otherwise, review of systems are positive for none.   All other systems are reviewed and negative.    PHYSICAL EXAM: VS:  BP (!) 140/90 (BP Location: Left Arm, Patient Position: Sitting, Cuff Size: Large)   Ht 5\' 6"  (1.676 m)   Wt 200 lb (90.7 kg)   SpO2 96%   BMI 32.28 kg/m  , BMI Body mass index is 32.28 kg/m. GEN: Well nourished, well developed, in no acute distress  HEENT: normal  Neck: no  carotid bruits, or masses.  Moderate JVD Cardiac: RRR; no murmurs, rubs, or gallops.  Moderate bilateral leg edema Respiratory:  clear to auscultation bilaterally, normal work of breathing GI: soft, nontender, nondistended, + BS MS: no deformity or atrophy  Skin: warm and dry, no rash Neuro:  Strength and sensation are intact Psych: euthymic mood, full affect   EKG:  EKG is ordered today. The ekg ordered today demonstrates sinus bradycardia with minimal LVH and repolarization abnormalities.   Recent Labs: 05/28/2022: ALT 19; BUN 29; Creat 1.50; Hemoglobin 14.2; Platelets 380; Potassium 3.9; Sodium 145; TSH 1.88    Lipid Panel    Component Value Date/Time   CHOL 109 12/27/2021 1506   CHOL 136 02/27/2018 1207   TRIG 128 12/27/2021 1506   HDL 52 12/27/2021 1506   HDL 53 02/27/2018 1207   CHOLHDL 2.1 12/27/2021 1506   VLDL 15 11/13/2018 0628   LDLCALC 36 12/27/2021 1506      Wt Readings from Last 3 Encounters:  07/04/22 200 lb (90.7 kg)  05/31/22 194 lb 3.2 oz (88.1 kg)  05/28/22 192 lb 6.4 oz (87.3 kg)         ASSESSMENT AND PLAN:  1.   Coronary artery disease involving native coronary arteries with other forms of angina:  She is on long-term dual antiplatelet therapy with aspirin and clopidogrel given previous left main stent.  Increased anginal symptoms in the setting of progressive volume overload. If anginal and heart or symptoms do not improve  with treatment of volume overload, recommend a right and left cardiac catheterization.  2.  Acute on chronic systolic heart failure: Due to ischemic cardiomyopathy with most recent ejection fraction 35-40%.  She is significantly volume overloaded and blood pressure has not been controlled.  She ran out of [place] 2 days ago.  I discussed with her again the importance of compliance.  Will go ahead and refill Entresto at the higher dose of 97/103 mg twice daily and I asked her to stop clonidine.  Continue carvedilol. I elected to stop furosemide and switch to torsemide 40 mg once daily with potassium chloride 20 mEq once daily.  3. Essential hypertension: Blood pressure is elevated.  Entresto was increased.  4. Hyperlipidemia: Continue atorvastatin 80 mg once daily.  Most recent lipid profile showed an LDL of 36.  5. Tobacco use: Still not able to quit.  6. Chronic kidney disease: Stable.  7.  Status post left renal artery stent: Renal artery duplex 2 days ago showed patent stent.    Disposition: Check labs in 1 week and follow-up in 1 month.  Signed,  Kathlyn Sacramento, MD  07/04/2022 11:54 AM    [place]

## 2022-07-08 ENCOUNTER — Other Ambulatory Visit (HOSPITAL_COMMUNITY): Payer: Self-pay

## 2022-07-08 ENCOUNTER — Telehealth: Payer: Self-pay | Admitting: Cardiovascular Disease

## 2022-07-08 ENCOUNTER — Telehealth: Payer: Self-pay

## 2022-07-08 NOTE — Telephone Encounter (Signed)
Pharmacy Patient Advocate Encounter  Prior Authorization for ENTRESTO 97-103 MG  has been approved.    PA# [id number] Effective dates: 06/24/22 through 07/08/23  Karie Soda, [place] Patient Advocate Specialist Direct Number: [phone] Fax: [phone]

## 2022-07-08 NOTE — Telephone Encounter (Signed)
PA approved, pt made aware.

## 2022-07-08 NOTE — Telephone Encounter (Signed)
Pt states that her pharmacy, [facility], needs a prior authorization asap for sacubitril-valsartan (ENTRESTO) 97-103 MG.

## 2022-07-08 NOTE — Telephone Encounter (Signed)
Pharmacy Patient Advocate Encounter   Received notification from [place] that prior authorization for ENTRESTO 97-103 MG is needed.    PA submitted on 07/08/22 Key [id number] Status is pending  Karie Soda, [place] Patient Advocate Specialist Direct Number: [phone] Fax: [phone]

## 2022-07-08 NOTE — Telephone Encounter (Signed)
Pt made aware of PA approval.

## 2022-07-15 DIAGNOSIS — Z419 Encounter for procedure for purposes other than remedying health state, unspecified: Secondary | ICD-10-CM | POA: Diagnosis not present

## 2022-07-17 ENCOUNTER — Other Ambulatory Visit: Payer: Self-pay | Admitting: Family Medicine

## 2022-07-17 DIAGNOSIS — G8929 Other chronic pain: Secondary | ICD-10-CM

## 2022-07-17 NOTE — Telephone Encounter (Signed)
Pt is completely out and she scheduled appt with Leisa for 4.16.2024. she was seen in feb for med fu. Does she need to keep this new appt?

## 2022-07-30 ENCOUNTER — Ambulatory Visit (INDEPENDENT_AMBULATORY_CARE_PROVIDER_SITE_OTHER): Payer: Medicaid Other | Admitting: Family Medicine

## 2022-07-30 ENCOUNTER — Encounter: Payer: Self-pay | Admitting: Family Medicine

## 2022-07-30 VITALS — BP 144/100 | HR 74 | Temp 98.4°F | Resp 16 | Ht 66.0 in | Wt 176.6 lb

## 2022-07-30 DIAGNOSIS — G8929 Other chronic pain: Secondary | ICD-10-CM

## 2022-07-30 DIAGNOSIS — M542 Cervicalgia: Secondary | ICD-10-CM | POA: Diagnosis not present

## 2022-07-30 DIAGNOSIS — R1010 Upper abdominal pain, unspecified: Secondary | ICD-10-CM | POA: Diagnosis not present

## 2022-07-30 DIAGNOSIS — I2510 Atherosclerotic heart disease of native coronary artery without angina pectoris: Secondary | ICD-10-CM | POA: Diagnosis not present

## 2022-07-30 DIAGNOSIS — N390 Urinary tract infection, site not specified: Secondary | ICD-10-CM

## 2022-07-30 DIAGNOSIS — E785 Hyperlipidemia, unspecified: Secondary | ICD-10-CM | POA: Diagnosis not present

## 2022-07-30 DIAGNOSIS — I739 Peripheral vascular disease, unspecified: Secondary | ICD-10-CM

## 2022-07-30 DIAGNOSIS — Z9889 Other specified postprocedural states: Secondary | ICD-10-CM

## 2022-07-30 DIAGNOSIS — Z1211 Encounter for screening for malignant neoplasm of colon: Secondary | ICD-10-CM

## 2022-07-30 DIAGNOSIS — I701 Atherosclerosis of renal artery: Secondary | ICD-10-CM | POA: Diagnosis not present

## 2022-07-30 MED ORDER — GABAPENTIN 300 MG PO CAPS: 300.0000 mg | ORAL_CAPSULE | Freq: Two times a day (BID) | ORAL | 1 refills | Status: DC

## 2022-07-30 MED ORDER — CLOPIDOGREL BISULFATE 75 MG PO TABS
ORAL_TABLET | ORAL | 1 refills | Status: DC
Start: 2022-07-30 — End: 2023-03-20

## 2022-07-30 MED ORDER — ATORVASTATIN CALCIUM 80 MG PO TABS: 80.0000 mg | ORAL_TABLET | Freq: Every day | ORAL | 2 refills | Status: DC

## 2022-07-30 MED ORDER — PANTOPRAZOLE SODIUM 40 MG PO TBEC: 40.0000 mg | DELAYED_RELEASE_TABLET | Freq: Two times a day (BID) | ORAL | 1 refills | Status: DC

## 2022-07-30 MED ORDER — ONDANSETRON 4 MG PO TBDP
4.0000 mg | ORAL_TABLET | Freq: Three times a day (TID) | ORAL | 0 refills | Status: DC | PRN
Start: 2022-07-30 — End: 2023-03-20

## 2022-07-30 NOTE — Progress Notes (Signed)
Name: Angelica Herrera   MRN: [id number]    DOB: Sep 05, 1970   Date:07/30/2022       Progress Note  Chief Complaint  Patient presents with   Medication Refill    For future use pt would like gabapentin to be prescribed by Sheliah Mends     Subjective:   Angelica Herrera is a 52 y.o. female, presents to clinic for med refills and f/up  Here with continued nausea/vomiting and continued abd pain and needs refills, She has GI appt coming up Continues to try ppi, pepcid, zofran Triggered by variety of foods - sugary foods, easter candy, tomato   Lab Results  Component Value Date   CHOL 109 12/27/2021   HDL 52 12/27/2021   LDLCALC 36 12/27/2021   TRIG 128 12/27/2021   CHOLHDL 2.1 12/27/2021        Current Outpatient Medications:    acetaminophen (TYLENOL) 500 MG tablet, Take 1,000 mg by mouth every 6 (six) hours as needed., Disp: , Rfl:    aspirin 81 MG tablet, Take 1 tablet (81 mg total) by mouth daily., Disp: 30 tablet, Rfl:    atorvastatin (LIPITOR) 80 MG tablet, TAKE ONE TABLET BY MOUTH AT BEDTIME., Disp: 30 tablet, Rfl: 0   busPIRone (BUSPAR) 5 MG tablet, TAKE 1-3 TABLETS BY MOUTH TWICE DAILY AS NEEDED., Disp: 120 tablet, Rfl: 2   carvedilol (COREG) 25 MG tablet, Take 1 tablet (25 mg total) by mouth 2 (two) times daily. (Patient taking differently: Take 12.5 mg by mouth 2 (two) times daily.), Disp: 60 tablet, Rfl: 1   cetirizine (ZYRTEC) 10 MG tablet, TAKE (1) TABLET BY MOUTH DAILY AT BEDTIME, Disp: 30 tablet, Rfl: 11   clopidogrel (PLAVIX) 75 MG tablet, TAKE (1) TABLET BY MOUTH EVERY DAY, Disp: 90 tablet, Rfl: 0   colchicine 0.6 MG tablet, Take 1 tablet (0.6 mg total) by mouth daily as needed (gout)., Disp: 15 tablet, Rfl: 0   famotidine (PEPCID) 20 MG tablet, Take 1 tablet (20 mg total) by mouth 2 (two) times daily as needed for heartburn or indigestion., Disp: 60 tablet, Rfl: 2   gabapentin (NEURONTIN) 300 MG capsule, TAKE (1) CAPSULE BY MOUTH TWICE DAILY, Disp: 120  capsule, Rfl: 0   levothyroxine (SYNTHROID) 125 MCG tablet, TAKE (1) TABLET BY MOUTH EVERY MORNING BEFORE BREAKFAST, Disp: 90 tablet, Rfl: 1   nitroGLYCERIN (NITROSTAT) 0.4 MG SL tablet, Place 1 tablet (0.4 mg total) under the tongue every 5 (five) minutes x 3 doses as needed for chest pain., Disp: 25 tablet, Rfl: 3   ondansetron (ZOFRAN-ODT) 4 MG disintegrating tablet, Take 1 tablet (4 mg total) by mouth every 8 (eight) hours as needed for nausea or vomiting., Disp: 20 tablet, Rfl: 0   pantoprazole (PROTONIX) 40 MG tablet, Take 1 tablet (40 mg total) by mouth daily. Empty stomach - take more than 4 hours after taking levothyroxine, Disp: 30 tablet, Rfl: 3   PARoxetine (PAXIL) 40 MG tablet, TAKE (1) TABLET BY MOUTH EVERY DAY, Disp: 90 tablet, Rfl: 1   potassium chloride SA (KLOR-CON M) 20 MEQ tablet, Take 1 tablet (20 mEq total) by mouth daily., Disp: 90 tablet, Rfl: 3   sacubitril-valsartan (ENTRESTO) 97-103 MG, Take 1 tablet by mouth 2 (two) times daily., Disp: 60 tablet, Rfl: 11   torsemide 40 MG TABS, Take 40 mg by mouth daily., Disp: 90 tablet, Rfl: 3  Current Facility-Administered Medications:    mometasone-formoterol (DULERA) 200-5 MCG/ACT inhaler 2 puff, 2 puff, Inhalation, BID, Sherilyn Windhorst,  Sheliah Mends, PA-C  Patient Active Problem List   Diagnosis Date Noted   Elevated hemoglobin 05/28/2022   History of pancreatitis 05/28/2022   Lack of access to transportation 12/27/2021   Arthritis 09/04/2021   Chronic combined systolic (congestive) and diastolic (congestive) heart failure 09/04/2021   Pancreatitis 09/04/2021   Stage 3b chronic kidney disease 02/18/2020   Anxiety with depression 02/18/2020   Tobacco dependence 02/18/2020   Rhinosinusitis 09/02/2019   Chronic obstructive pulmonary disease 09/02/2019   Squamous cell carcinoma of back 04/20/2019   Accelerated hypertension 11/13/2018   Chest pain 11/12/2018   S/P primary angioplasty with coronary stent 03/04/2018   Depression, recurrent  12/02/2017   Chronic systolic heart failure 11/21/2017   Controlled substance agreement signed 09/07/2015   Coronary artery disease involving native coronary artery    Allergic rhinitis due to pollen 06/28/2015   GAD (generalized anxiety disorder) 03/17/2015   Degenerative disc disease, cervical 03/17/2015   Hyperlipidemia    Obesity    CKD (chronic kidney disease), stage IV    Ischemic cardiomyopathy    Hypothyroidism    History of acute anterior wall MI 02/12/2012   Essential hypertension, malignant 02/12/2012   Tobacco abuse 02/12/2012    Past Surgical History:  Procedure Laterality Date   ABDOMINAL HYSTERECTOMY     CARDIAC CATHETERIZATION  2013   [facility] s/p stent   CARDIAC CATHETERIZATION Left 08/03/2015   Procedure: Left Heart Cath and Coronary Angiography;  Surgeon: Iran Ouch, MD;  Location: [facility] INVASIVE CV LAB;  Service: Cardiovascular;  Laterality: Left;   DILATION AND CURETTAGE OF UTERUS     LEFT HEART CATHETERIZATION WITH CORONARY ANGIOGRAM N/A 02/11/2012   Procedure: LEFT HEART CATHETERIZATION WITH CORONARY ANGIOGRAM;  Surgeon: Tonny Bollman, MD;  Location: [facility] CATH LAB;  Service: Cardiovascular;  Laterality: N/A;   RENAL INTERVENTION N/A 11/17/2018   Procedure: RENAL INTERVENTION;  Surgeon: Renford Dills, MD;  Location: [facility] INVASIVE CV LAB;  Service: Cardiovascular;  Laterality: N/A;    Family History  Problem Relation Age of Onset   Hypertension Father    Thyroid disease Father    AAA (abdominal aortic aneurysm) Father    Hypertension Mother    Hyperlipidemia Mother    Diabetes Mother    Kidney disease Mother    Hypertension Sister    Hypertension Sister    Supraventricular tachycardia Sister    Hypertension Brother    Hypertension Brother    Hypertension Daughter    Cancer Paternal Grandfather        lung    Social History   Tobacco Use   Smoking status: Every Day    Packs/day: 0.25    Years: 30.00    Additional pack years: 0.00    Total  pack years: 7.50    Types: Cigarettes   Smokeless tobacco: Never  Vaping Use   Vaping Use: Never used  Substance Use Topics   Alcohol use: Not Currently    Comment: Says she previously drank socially - never a heavy drinker.  No etoh since 08/2018.   Drug use: No    Types: Marijuana     No Known Allergies  Health Maintenance  Topic Date Due   DTaP/Tdap/Td (1 - Tdap) Never done   COLONOSCOPY (Pts 45-74yrs Insurance coverage will need to be confirmed)  Never done   INFLUENZA VACCINE  11/14/2022   Hepatitis C Screening  Completed   HIV Screening  Completed   HPV VACCINES  Aged Out   MAMMOGRAM  Discontinued  PAP SMEAR-Modifier  Discontinued   COVID-19 Vaccine  Discontinued   Zoster Vaccines- Shingrix  Discontinued    Chart Review Today: I personally reviewed active problem list, medication list, allergies, family history, social history, health maintenance, notes from last encounter, lab results, imaging with the patient/caregiver today.   Review of Systems  Constitutional: Negative.   HENT: Negative.    Eyes: Negative.   Respiratory: Negative.    Cardiovascular: Negative.   Gastrointestinal: Negative.   Endocrine: Negative.   Genitourinary: Negative.   Musculoskeletal: Negative.   Skin: Negative.   Allergic/Immunologic: Negative.   Neurological: Negative.   Hematological: Negative.   Psychiatric/Behavioral: Negative.    All other systems reviewed and are negative.    Objective:   Vitals:   07/30/22 0904 07/30/22 0921  BP: (!) 144/100 (!) 144/100  Pulse: 74   Resp: 16   Temp: 98.4 F (36.9 C)   TempSrc: Oral   SpO2: 93%   Weight: 176 lb 9.6 oz (80.1 kg)   Height: 5\' 6"  (1.676 m)     Body mass index is 28.5 kg/m.  Physical Exam Vitals and nursing note reviewed.  Constitutional:      General: She is not in acute distress.    Appearance: Normal appearance. She is well-developed. She is not ill-appearing, toxic-appearing or diaphoretic.  HENT:      Head: Normocephalic and atraumatic.     Nose: Nose normal.  Eyes:     General: No scleral icterus.       Right eye: No discharge.        Left eye: No discharge.     Conjunctiva/sclera: Conjunctivae normal.  Neck:     Trachea: No tracheal deviation.  Cardiovascular:     Rate and Rhythm: Normal rate and regular rhythm.     Pulses: Normal pulses.     Heart sounds:     No gallop.  Pulmonary:     Effort: Pulmonary effort is normal. No respiratory distress.     Breath sounds: No stridor. No wheezing, rhonchi or rales.  Abdominal:     General: Bowel sounds are normal. There is no distension.     Palpations: Abdomen is soft.     Tenderness: There is abdominal tenderness. There is no guarding or rebound.  Skin:    General: Skin is warm and dry.     Coloration: Skin is not jaundiced or pale.     Findings: No rash.  Neurological:     Mental Status: She is alert. Mental status is at baseline.     Motor: No abnormal muscle tone.     Coordination: Coordination normal.     Gait: Gait normal.  Psychiatric:        Mood and Affect: Mood normal.        Behavior: Behavior normal.         Assessment & Plan:     ICD-10-CM   1. Hyperlipidemia LDL goal <70  E78.5 atorvastatin (LIPITOR) 80 MG tablet   on statin, labs checked Sept, continue same meds, refills ordered, labs due annually    2. Coronary artery disease involving native coronary artery of native heart without angina pectoris  I25.10 atorvastatin (LIPITOR) 80 MG tablet   follows with cardiology regularly    3. Peripheral artery disease  I73.9 clopidogrel (PLAVIX) 75 MG tablet   Compliant with medications including Lipitor Plavix and aspirin    4. Left renal artery stenosis  I70.1 clopidogrel (PLAVIX) 75 MG tablet   refill on  plavix - monitoring with specialists    5. History of stent insertion of renal artery  Z98.890 clopidogrel (PLAVIX) 75 MG tablet    6. Chronic neck pain  M54.2 gabapentin (NEURONTIN) 300 MG capsule    G89.29    refill on chronic meds    7. Pain of upper abdomen  R10.10 ondansetron (ZOFRAN-ODT) 4 MG disintegrating tablet    pantoprazole (PROTONIX) 40 MG tablet   no change to sx, still waiting for GI appt, refill on zofran and PPI, last labs x 2 neg    8. Screening for colon cancer  Z12.11    seeing GI soon - with her other sx I expect them to do colonscopy         Return for 5 month f/up (sept routine f/up and labs).   Danelle Berry, PA-C 07/30/22 9:41 AM

## 2022-08-05 ENCOUNTER — Ambulatory Visit: Payer: Medicaid Other | Admitting: Physician Assistant

## 2022-08-08 ENCOUNTER — Encounter: Payer: Self-pay | Admitting: Family Medicine

## 2022-08-14 DIAGNOSIS — Z419 Encounter for procedure for purposes other than remedying health state, unspecified: Secondary | ICD-10-CM | POA: Diagnosis not present

## 2022-08-16 ENCOUNTER — Ambulatory Visit: Payer: Medicaid Other | Admitting: Physician Assistant

## 2022-08-16 NOTE — Progress Notes (Deleted)
Cardiology Office Note    Date:  08/16/2022   ID:  Angelica Herrera, DOB November 07, 1970, MRN [id number]  PCP:  Danelle Berry, PA-C  Cardiologist:  Lorine Bears, MD  Electrophysiologist:  None   Chief Complaint: Follow up  History of Present Illness:   Angelica Herrera is a 52 y.o. female with history of ***  ***   Labs independently reviewed: ***  Past Medical History:  Diagnosis Date   Accelerated hypertension 11/13/2018   Allergic rhinitis due to pollen 06/28/2015   Anxiety with depression 02/18/2020   Arthritis    gout   Chest pain 11/12/2018   Chronic combined systolic (congestive) and diastolic (congestive) heart failure (HCC)    a. 02/2017 Echo: EF 30-35%, diff HK, sev HK/poss AK of ant and lat walls. Gr1 DD. Mild to mod MR. Nl RV fxn. Mod TR.   Chronic obstructive pulmonary disease (HCC) 09/02/2019   tx with trelegy and SABA   Chronic systolic heart failure (HCC) 11/21/2017   CKD (chronic kidney disease), stage IV (HCC)    Controlled substance agreement signed 09/07/2015   Coronary artery disease involving native coronary artery    Degenerative disc disease, cervical 03/17/2015   Depression, recurrent (HCC) 12/02/2017   Essential hypertension, malignant 02/12/2012   GAD (generalized anxiety disorder) 03/17/2015   History of acute anterior wall MI 02/12/2012   Hyperlipidemia    Hypothyroidism    Ischemic cardiomyopathy    a. 02/2012 Echo: EF 40-45%, ant/lat HK, Gr1 DD, Mild MR, PASP ; b. 02/2017 Echo: EF 30-35%.   NSVT (nonsustained ventricular tachycardia) (HCC)    a. 02/2012 post-MI   Obesity    Pancreatitis    Rhinosinusitis 09/02/2019   S/P primary angioplasty with coronary stent 03/04/2018   Dual antiplatelet therapy INDEFINITELY per cardiology note Nov 2019   Squamous cell carcinoma of back 04/20/2019   referrred to sugery for f/up excision, derm not available for several month, lesion with rapid change cannot wait, still see derm for skin survey etc    Stage 3b chronic kidney disease (HCC) 02/18/2020   Tobacco abuse    Tobacco dependence 02/18/2020    Past Surgical History:  Procedure Laterality Date   ABDOMINAL HYSTERECTOMY     CARDIAC CATHETERIZATION  2013   [facility] s/p stent   CARDIAC CATHETERIZATION Left 08/03/2015   Procedure: Left Heart Cath and Coronary Angiography;  Surgeon: Iran Ouch, MD;  Location: [facility] INVASIVE CV LAB;  Service: Cardiovascular;  Laterality: Left;   DILATION AND CURETTAGE OF UTERUS     LEFT HEART CATHETERIZATION WITH CORONARY ANGIOGRAM N/A 02/11/2012   Procedure: LEFT HEART CATHETERIZATION WITH CORONARY ANGIOGRAM;  Surgeon: Tonny Bollman, MD;  Location: [facility] CATH LAB;  Service: Cardiovascular;  Laterality: N/A;   RENAL INTERVENTION N/A 11/17/2018   Procedure: RENAL INTERVENTION;  Surgeon: Renford Dills, MD;  Location: [facility] INVASIVE CV LAB;  Service: Cardiovascular;  Laterality: N/A;    Current Medications: No outpatient medications have been marked as taking for the 08/16/22 encounter (Appointment) with Sondra Barges, PA-C.   Current Facility-Administered Medications for the 08/16/22 encounter (Appointment) with Sondra Barges, PA-C  Medication   mometasone-formoterol (DULERA) 200-5 MCG/ACT inhaler 2 puff    Allergies:   Patient has no known allergies.   Social History   Socioeconomic History   Marital status: Legally Separated    Spouse name: Not on file   Number of children: Not on file   Years of education: Not on file  Highest education level: Not on file  Occupational History   Not on file  Tobacco Use   Smoking status: Every Day    Packs/day: 0.25    Years: 30.00    Additional pack years: 0.00    Total pack years: 7.50    Types: Cigarettes   Smokeless tobacco: Never  Vaping Use   Vaping Use: Never used  Substance and Sexual Activity   Alcohol use: Not Currently    Comment: Says she previously drank socially - never a heavy drinker.  No etoh since 08/2018.   Drug use: No    Types:  Marijuana   Sexual activity: Not on file  Other Topics Concern   Not on file  Social History Narrative   Lives in [place] w/ her dtr.  Does not routinely exercise.  Disabled/doesn't work.   Social Determinants of Health   Financial Resource Strain: Not on file  Food Insecurity: Not on file  Transportation Needs: Not on file  Physical Activity: Not on file  Stress: Not on file  Social Connections: Not on file     Family History:  The patient's family history includes AAA (abdominal aortic aneurysm) in her father; Cancer in her paternal grandfather; Diabetes in her mother; Hyperlipidemia in her mother; Hypertension in her brother, brother, daughter, father, mother, sister, and sister; Kidney disease in her mother; Supraventricular tachycardia in her sister; Thyroid disease in her father.  ROS:   12-point review of systems is negative unless otherwise noted in the HPI.   EKGs/Labs/Other Studies Reviewed:    Studies reviewed were summarized above. The additional studies were reviewed today:  Renal artery ultrasound 07/02/2022: Largest Aortic Diameter: 2.4 cm  Renal:  Right: Normal size right kidney. No evidence of right renal artery         stenosis. Normal right Resisitive Index. Normal cortical         thickness of right kidney. RRV flow present.  Left:  Normal size of left kidney. No evidence of left renal artery         stenosis. LRV flow present. Normal left Resistive Index.         Normal cortical thickness of the left kidney.  Mesenteric:  Normal Celiac artery and Superior Mesenteric artery findings.  ____________  2D echo 02/19/2019:  1. Left ventricular ejection fraction, by visual estimation, is 35 to  40%. The left ventricle has moderately decreased function. There is no  left ventricular hypertrophy. Anterior and Septal wall hypokinesis.   2. Left ventricular diastolic parameters are consistent with Grade II  diastolic dysfunction (pseudonormalization).   3. Mildly  dilated left ventricular internal cavity size.   4. Global right ventricle has normal systolic function.The right  ventricular size is mildly enlarged. No increase in right ventricular wall  thickness.   5. Left atrial size was mild-moderately dilated.   6. Right atrial size was mildly dilated.   7. Moderately elevated pulmonary artery systolic pressure.   8. The tricuspid regurgitant velocity is 2.80 m/s, and with an assumed  right atrial pressure of 20 mmHg, the estimated right ventricular systolic  pressure is moderately elevated at 51.2 mmHg.   9. Small pericardial effusion. __________   2D echo 02/21/2017: - Left ventricle: The cavity size was normal. Systolic function was    moderately to severely reduced. The estimated ejection fraction    was in the range of 30% to 35%. Diffuse hypokinesis with severe    hypokinesis /possible akinesis of the anterior and  lateral walls.    Doppler parameters are consistent with abnormal left ventricular    relaxation (grade 1 diastolic dysfunction).  - Mitral valve: There was mild to moderate regurgitation.  - Left atrium: The atrium was normal in size.  - Right ventricle: Systolic function was normal.  - Tricuspid valve: There was moderate regurgitation.  - Pulmonary arteries: Systolic pressure was mildly elevated. __________   Eugenie Birks MPI 02/14/2017: There was no ST segment deviation noted during stress. No T wave inversion was noted during stress. Defect 1: There is a medium defect of severe severity present in the basal anterior, basal anterolateral, mid anterior and mid anterolateral location. This is an intermediate risk study. The left ventricular ejection fraction is moderately decreased (30-44%). Findings consistent with prior myocardial infarction with mild peri-infarct ischemia. __________   2D echo 10/06/2015: - Left ventricle: The cavity size was normal. Systolic function was    moderately reduced. The estimated ejection  fraction was in the    range of 35% to 40%. Hypokinesis of the anterior myocardium.    Hypokinesis of the anteroseptal myocardium. Doppler parameters    are consistent with abnormal left ventricular relaxation (grade 1    diastolic dysfunction).  - Mitral valve: Calcified annulus. There was mild regurgitation.  - Left atrium: The atrium was normal in size.  - Right ventricle: Systolic function was normal.  - Pulmonary arteries: Systolic pressure was within the normal    range. ___________   LHC 08/03/2015: Ost LM to LM lesion, 10% stenosed. The lesion was previously treated with a stent (unknown type). Mid LAD lesion, 60% stenosed. Mid RCA lesion, 30% stenosed. There is moderate left ventricular systolic dysfunction.   1. Widely patent ostial left main stent with moderate mid LAD stenosis and occluded second diagonal with faint collaterals. The right coronary artery is very large with mild mid stenosis. 2. Moderately reduced LV systolic function with an ejection fraction of 35-40% with significant anterior wall hypokinesis. Mildly elevated left ventricular end-diastolic pressure. 3. Significant radial artery spasm which required increased amounts of sedation.   Recommendations: Continue medical therapy for coronary artery disease and chronic systolic heart failure. We will need to transition her from clonidine to an ACE inhibitor or ARB with close monitoring of renal function. __________   Eugenie Birks MPI 07/28/2015: Pharmacological myocardial perfusion imaging study with attenuation corrected images suggesting mild anterior wall ischemia, mid to apical region Nonattenuation corrected images also with mild mid to apical anterior wall ischemia Patient attempted exercise on bruce protocol though unable to reach target heart rate, changed to lexiscan Anteroseptal wall hypokinesis noted, EF estimated at 33% No EKG changes concerning for ischemia at peak stress or in recovery. Moderate risk  scan with mild ischemia as above __________   2D echo 02/12/2012: - Left ventricle: The cavity size was normal. Wall thickness    was increased in a pattern of mild LVH. Systolic function    was mildly to moderately reduced. The estimated ejection    fraction was in the range of 40% to 45%. Hypokinesis of    the entireanterior and lateral myocardium. Doppler    parameters are consistent with abnormal left ventricular    relaxation (grade 1 diastolic dysfunction).  - Mitral valve: Mild regurgitation.  - Pulmonary arteries: PA peak pressure: 43mm Hg (S).   EKG:  EKG is ordered today.  The EKG ordered today demonstrates ***  Recent Labs: 05/28/2022: ALT 19; BUN 29; Creat 1.50; Hemoglobin 14.2; Platelets 380; Potassium 3.9; Sodium 145;  TSH 1.88  Recent Lipid Panel    Component Value Date/Time   CHOL 109 12/27/2021 1506   CHOL 136 02/27/2018 1207   TRIG 128 12/27/2021 1506   HDL 52 12/27/2021 1506   HDL 53 02/27/2018 1207   CHOLHDL 2.1 12/27/2021 1506   VLDL 15 11/13/2018 0628   LDLCALC 36 12/27/2021 1506    PHYSICAL EXAM:    VS:  There were no vitals taken for this visit.  BMI: There is no height or weight on file to calculate BMI.  Physical Exam  Wt Readings from Last 3 Encounters:  07/30/22 176 lb 9.6 oz (80.1 kg)  07/04/22 200 lb (90.7 kg)  05/31/22 194 lb 3.2 oz (88.1 kg)     ASSESSMENT & PLAN:   ***   {Are you ordering a CV Procedure (e.g. stress test, cath, DCCV, TEE, etc)?   Press F2        :[id number]}     Disposition: F/u with Dr. Kirke Corin or an APP in ***.   Medication Adjustments/Labs and Tests Ordered: Current medicines are reviewed at length with the patient today.  Concerns regarding medicines are outlined above. Medication changes, Labs and Tests ordered today are summarized above and listed in the Patient Instructions accessible in Encounters.   Signed, Eula Listen, PA-C 08/16/2022 8:10 AM     [facility] -  [address]  [address] [place], [place] [address] [phone]

## 2022-09-14 DIAGNOSIS — Z419 Encounter for procedure for purposes other than remedying health state, unspecified: Secondary | ICD-10-CM | POA: Diagnosis not present

## 2022-09-17 ENCOUNTER — Other Ambulatory Visit: Payer: Self-pay

## 2022-09-17 ENCOUNTER — Encounter: Payer: Self-pay | Admitting: Gastroenterology

## 2022-09-17 ENCOUNTER — Ambulatory Visit (INDEPENDENT_AMBULATORY_CARE_PROVIDER_SITE_OTHER): Payer: Medicaid Other | Admitting: Gastroenterology

## 2022-09-17 VITALS — BP 120/86 | HR 93 | Temp 98.7°F | Ht 66.0 in | Wt 175.6 lb

## 2022-09-17 DIAGNOSIS — Z800 Family history of malignant neoplasm of digestive organs: Secondary | ICD-10-CM | POA: Diagnosis not present

## 2022-09-17 DIAGNOSIS — R1084 Generalized abdominal pain: Secondary | ICD-10-CM

## 2022-09-17 MED ORDER — NA SULFATE-K SULFATE-MG SULF 17.5-3.13-1.6 GM/177ML PO SOLN: 354.0000 mL | Freq: Once | ORAL | 0 refills | Status: AC

## 2022-09-17 NOTE — Addendum Note (Signed)
Addended by: Adela Ports on: 09/17/2022 02:58 PM   Modules accepted: Orders

## 2022-09-17 NOTE — Progress Notes (Signed)
Wyline Mood MD, MRCP(U.K) [address]  [address]  [place], [place] [address]  Main: [phone]  Fax: [phone]   Gastroenterology Consultation  Referring Provider:     Danelle Berry, PA-C Primary Care Physician:  Danelle Berry, PA-C Primary Gastroenterologist:  Dr. Wyline Mood  Reason for Consultation:     Abdominal pain        HPI:   Angelica Herrera is a 52 y.o. y/o female referred for consultation & management  by Danelle Berry, PA-C.     Has been referred for abdominal pain as well as colon cancer screening.  She says she has had abdominal pain for many years points to her right and left side of the abdomen at least 2 times a week and each episode last for hours.  Feels like a pressure.  Denies any NSAID use.  Nonradiating.  Not always relieved by bowel movement but often is she does sometimes pass hard stools.  Weight gain rather than weight loss.  Family history of colon cancer in her father in his 91s.  She has never had a colonoscopy.  No change in bowel habits.  No blood in the stool.   06/04/2022 ultrasound abdomen showed gallbladder is contracted 3.9 mm thick neck 05/28/2022 CMP LFTs normal.  Creatinine 1.5.  Hemoglobin 14.2.  Lipase normal, TSH normal No prior endoscopic evaluation.  Past Medical History:  Diagnosis Date   Accelerated hypertension 11/13/2018   Allergic rhinitis due to pollen 06/28/2015   Anxiety with depression 02/18/2020   Arthritis    gout   Chest pain 11/12/2018   Chronic combined systolic (congestive) and diastolic (congestive) heart failure (HCC)    a. 02/2017 Echo: EF 30-35%, diff HK, sev HK/poss AK of ant and lat walls. Gr1 DD. Mild to mod MR. Nl RV fxn. Mod TR.   Chronic obstructive pulmonary disease (HCC) 09/02/2019   tx with trelegy and SABA   Chronic systolic heart failure (HCC) 11/21/2017   CKD (chronic kidney disease), stage IV (HCC)    Controlled substance agreement signed 09/07/2015   Coronary artery disease involving native  coronary artery    Degenerative disc disease, cervical 03/17/2015   Depression, recurrent (HCC) 12/02/2017   Essential hypertension, malignant 02/12/2012   GAD (generalized anxiety disorder) 03/17/2015   History of acute anterior wall MI 02/12/2012   Hyperlipidemia    Hypothyroidism    Ischemic cardiomyopathy    a. 02/2012 Echo: EF 40-45%, ant/lat HK, Gr1 DD, Mild MR, PASP ; b. 02/2017 Echo: EF 30-35%.   NSVT (nonsustained ventricular tachycardia) (HCC)    a. 02/2012 post-MI   Obesity    Pancreatitis    Rhinosinusitis 09/02/2019   S/P primary angioplasty with coronary stent 03/04/2018   Dual antiplatelet therapy INDEFINITELY per cardiology note Nov 2019   Squamous cell carcinoma of back 04/20/2019   referrred to sugery for f/up excision, derm not available for several month, lesion with rapid change cannot wait, still see derm for skin survey etc   Stage 3b chronic kidney disease (HCC) 02/18/2020   Tobacco abuse    Tobacco dependence 02/18/2020    Past Surgical History:  Procedure Laterality Date   ABDOMINAL HYSTERECTOMY     CARDIAC CATHETERIZATION  2013   [facility] s/p stent   CARDIAC CATHETERIZATION Left 08/03/2015   Procedure: Left Heart Cath and Coronary Angiography;  Surgeon: Iran Ouch, MD;  Location: [facility] INVASIVE CV LAB;  Service: Cardiovascular;  Laterality: Left;   DILATION AND CURETTAGE OF UTERUS  LEFT HEART CATHETERIZATION WITH CORONARY ANGIOGRAM N/A 02/11/2012   Procedure: LEFT HEART CATHETERIZATION WITH CORONARY ANGIOGRAM;  Surgeon: Tonny Bollman, MD;  Location: [facility] CATH LAB;  Service: Cardiovascular;  Laterality: N/A;   RENAL INTERVENTION N/A 11/17/2018   Procedure: RENAL INTERVENTION;  Surgeon: Renford Dills, MD;  Location: [facility] INVASIVE CV LAB;  Service: Cardiovascular;  Laterality: N/A;    Prior to Admission medications   Medication Sig Start Date End Date Taking? Authorizing Provider  acetaminophen (TYLENOL) 500 MG tablet Take 1,000 mg by mouth every 6  (six) hours as needed.    [provider]  aspirin 81 MG tablet Take 1 tablet (81 mg total) by mouth daily. 02/15/12   Creig Hines, NP  atorvastatin (LIPITOR) 80 MG tablet Take 1 tablet (80 mg total) by mouth at bedtime. 07/30/22   Danelle Berry, PA-C  busPIRone (BUSPAR) 5 MG tablet TAKE 1-3 TABLETS BY MOUTH TWICE DAILY AS NEEDED. 07/04/22   Danelle Berry, PA-C  carvedilol (COREG) 25 MG tablet Take 1 tablet (25 mg total) by mouth 2 (two) times daily. Patient taking differently: Take 12.5 mg by mouth 2 (two) times daily. 05/28/22 08/26/22  Sondra Barges, PA-C  cetirizine (ZYRTEC) 10 MG tablet TAKE (1) TABLET BY MOUTH DAILY AT BEDTIME 11/01/21   Berniece Salines, FNP  clopidogrel (PLAVIX) 75 MG tablet TAKE (1) TABLET BY MOUTH EVERY DAY 07/30/22   Danelle Berry, PA-C  colchicine 0.6 MG tablet Take 1 tablet (0.6 mg total) by mouth daily as needed (gout). 06/01/21   Margarita Mail, DO  famotidine (PEPCID) 20 MG tablet Take 1 tablet (20 mg total) by mouth 2 (two) times daily as needed for heartburn or indigestion. 05/28/22   Danelle Berry, PA-C  gabapentin (NEURONTIN) 300 MG capsule Take 1 capsule (300 mg total) by mouth 2 (two) times daily. 07/30/22   Danelle Berry, PA-C  levothyroxine (SYNTHROID) 125 MCG tablet TAKE (1) TABLET BY MOUTH EVERY MORNING BEFORE BREAKFAST 07/04/22   Danelle Berry, PA-C  nitroGLYCERIN (NITROSTAT) 0.4 MG SL tablet Place 1 tablet (0.4 mg total) under the tongue every 5 (five) minutes x 3 doses as needed for chest pain. 08/25/19   Alver Sorrow, NP  ondansetron (ZOFRAN-ODT) 4 MG disintegrating tablet Take 1 tablet (4 mg total) by mouth every 8 (eight) hours as needed for nausea or vomiting. 07/30/22   Danelle Berry, PA-C  pantoprazole (PROTONIX) 40 MG tablet Take 1 tablet (40 mg total) by mouth 2 (two) times daily. Empty stomach - take more than 4 hours after taking levothyroxine 07/30/22   Danelle Berry, PA-C  PARoxetine (PAXIL) 40 MG tablet TAKE (1) TABLET BY MOUTH EVERY  DAY 03/27/22   Danelle Berry, PA-C  potassium chloride SA (KLOR-CON M) 20 MEQ tablet Take 1 tablet (20 mEq total) by mouth daily. 07/04/22 06/29/23  Iran Ouch, MD  sacubitril-valsartan (ENTRESTO) 97-103 MG Take 1 tablet by mouth 2 (two) times daily. 07/04/22   Iran Ouch, MD  torsemide 40 MG TABS Take 40 mg by mouth daily. 07/04/22   Iran Ouch, MD    Family History  Problem Relation Age of Onset   Hypertension Father    Thyroid disease Father    AAA (abdominal aortic aneurysm) Father    Hypertension Mother    Hyperlipidemia Mother    Diabetes Mother    Kidney disease Mother    Hypertension Sister    Hypertension Sister    Supraventricular tachycardia Sister    Hypertension Brother  Hypertension Brother    Hypertension Daughter    Cancer Paternal Grandfather        lung     Social History   Tobacco Use   Smoking status: Every Day    Packs/day: 0.25    Years: 30.00    Additional pack years: 0.00    Total pack years: 7.50    Types: Cigarettes   Smokeless tobacco: Never  Vaping Use   Vaping Use: Never used  Substance Use Topics   Alcohol use: Not Currently    Comment: Says she previously drank socially - never a heavy drinker.  No etoh since 08/2018.   Drug use: No    Types: Marijuana    Allergies as of 09/17/2022   (No Known Allergies)    Review of Systems:    All systems reviewed and negative except where noted in HPI.   Physical Exam:  BP (!) 142/95   Pulse (!) 111   Temp 98.7 F (37.1 C) (Oral)   Ht 5\' 6"  (1.676 m)   Wt 175 lb 9.6 oz (79.7 kg)   BMI 28.34 kg/m  No LMP recorded. Patient has had a hysterectomy. Psych:  Alert and cooperative. Normal mood and affect. General:   Alert,  Well-developed, well-nourished, pleasant and cooperative in NAD Head:  Normocephalic and atraumatic. Eyes:  Sclera clear, no icterus.   Conjunctiva pink. Heart:  Regular rate and rhythm; no murmurs, clicks, rubs, or gallops. Abdomen:  Normal bowel sounds.   No bruits.  Soft, non-tender and non-distended without masses, hepatosplenomegaly or hernias noted.  No guarding or rebound tenderness.    Neurologic:  Alert and oriented x3;  grossly normal neurologically. Psych:  Alert and cooperative. Normal mood and affect.  Imaging Studies: No results found.  Assessment and Plan:   Angelica Herrera is a 51 y.o. y/o female has been referred for: Cancer screening and abdominal pain.  Right upper quadrant ultrasound shows thickened gallbladder wall.  The description of her pain is  nonspecific does have some features of IBS constipation.  Family history of colon cancer, never had a colonoscopy  Plan 1.  EGD and colonoscopy 2.  Trial of Linzess 145 mcg samples will be provided for a week if it helps she should call our office to get a prescription if does not help we can increase the dose. 3.  She has had a CT scan ordered which I have advised her to pursue. 4.  The pain she describes is not typically biliary in nature but if all tests are negative and she continues to have the pain we could pursue with the HIDA scan   I have discussed alternative options, risks & benefits,  which include, but are not limited to, bleeding, infection, perforation,respiratory complication & drug reaction.  The patient agrees with this plan & written consent will be obtained.     Follow up in 8-10 weeks with PA  Dr Wyline Mood MD,MRCP(U.K)

## 2022-09-28 ENCOUNTER — Other Ambulatory Visit: Payer: Self-pay | Admitting: Physician Assistant

## 2022-10-02 ENCOUNTER — Ambulatory Visit: Payer: Medicaid Other

## 2022-10-08 NOTE — Progress Notes (Deleted)
Cardiology Office Note    Date:  10/08/2022   ID:  Angelica Herrera, DOB 26-Jan-1971, MRN [id number]  PCP:  Danelle Berry, PA-C  Cardiologist:  Lorine Bears, MD  Electrophysiologist:  None   Chief Complaint: Follow-up  History of Present Illness:   Angelica Herrera is a 52 y.o. female with history of CAD with anterolateral ST elevation MI in 01/2012 with PTCA/DES to the left main at that time as detailed below complicated by hemodynamic instability and VT, HFrEF secondary to ICM, left renal artery stenosis status post left renal artery stenting in 11/2018 by vascular surgery, CKD stage III, HLD, hypothyroidism, depression, and tobacco use who presents for follow-up of volume overload.   She was admitted to the hospital in 01/2012 with an anterolateral STEMI. She underwent emergent cardiac catheterization which showed occlusion of the left main coronary artery. There was significant difficulty engaging the left main, though she ultimately underwent successful PCI/DES. She also had PTCA done to the LAD due to embolization from the left main. Post procedure she had significant hemodynamic instablity with VT and hypotension. She was noted to have severe MR initially, however subsequent echocardiogram showed an EF of 45% with no significant MR. She was lost to follow up from 2013-2017. Upon re-establishing with our group in 2017, she continued to smoke and had been off her medications. In 2017, she had atypical chest pain with a Lexiscan MPI showing prior anterior infarct with mild peri-infarct ischemia and an EF of 33%. She underwent repeat cardiac cath at that time which showed a patent left main stent with moderate mid LAD stenosis and an occluded D2 with faint collaterals. The RCA was very large with mild mid stenosis. EF was 35-40% with anterior wall hypokinesis. Continued medical therapy was advised. She underwent repeat nuclear stress testing in 02/2017 for recurrent chest pain that showed a  similar finding of prior anterior infarct with mild peri-infarct ischemia.  Echo at that time demonstrated an EF of 30 to 35%, diffuse hypokinesis with severe hypokinesis/possible akinesis of the anterior and lateral walls, grade 1 diastolic dysfunction, mild to moderate mitral regurgitation, normal RV systolic function, and mildly elevated PASP.  Echo from 02/2019 demonstrated an EF of 35 to 40%, anterior and septal wall hypokinesis, grade 2 diastolic dysfunction, mildly dilated LV internal cavity size, normal RV systolic function with mildly enlarged ventricular cavity, mildly to moderately dilated left atrium, mildly dilated right atrium, moderately elevated PASP estimated at 51.2 mmHg.  She has had intermittent periods of noncompliance with medications causing elevated blood pressure and decompensated heart failure.  She has continued to smoke.  Renal artery duplex in 06/2022 showed a patent renal artery stent.  She was last seen in the office in 06/2022 and had ran out of [place] 3 days prior.  In this setting, she was taking clonidine 0.1 mg twice daily instead.  She reported increased shortness of breath and lower extremity swelling over the preceding month with a 10 pound weight gain.  She reported taking furosemide 20 mg once daily without significant urine output and even doubled the dose to 40 mg for a few days without significant change.  She reported worsening chest pain as well.  Those were felt to be in the setting of progressive volume overload.  Entresto was refilled at higher dose 97/103 mg twice daily with recommendation to stop clonidine and continue carvedilol.  Furosemide was transitioned to torsemide 40 mg daily with KCl repletion.  She continued to smoke.  ***  Labs independently reviewed: 05/2022 - TSH normal, Hgb 14.2, PLT 380, BUN 29, serum creatinine 1.5, potassium 3.9, albumin 3.8, AST/ALT normal 12/2021 - TC 109, TG 128, HDL 52, LDL 36 01/2021 - A1c 5.5  Past Medical History:   Diagnosis Date   Accelerated hypertension 11/13/2018   Allergic rhinitis due to pollen 06/28/2015   Anxiety with depression 02/18/2020   Arthritis    gout   Chest pain 11/12/2018   Chronic combined systolic (congestive) and diastolic (congestive) heart failure (HCC)    a. 02/2017 Echo: EF 30-35%, diff HK, sev HK/poss AK of ant and lat walls. Gr1 DD. Mild to mod MR. Nl RV fxn. Mod TR.   Chronic obstructive pulmonary disease (HCC) 09/02/2019   tx with trelegy and SABA   Chronic systolic heart failure (HCC) 11/21/2017   CKD (chronic kidney disease), stage IV (HCC)    Controlled substance agreement signed 09/07/2015   Coronary artery disease involving native coronary artery    Degenerative disc disease, cervical 03/17/2015   Depression, recurrent (HCC) 12/02/2017   Essential hypertension, malignant 02/12/2012   GAD (generalized anxiety disorder) 03/17/2015   History of acute anterior wall MI 02/12/2012   Hyperlipidemia    Hypothyroidism    Ischemic cardiomyopathy    a. 02/2012 Echo: EF 40-45%, ant/lat HK, Gr1 DD, Mild MR, PASP ; b. 02/2017 Echo: EF 30-35%.   NSVT (nonsustained ventricular tachycardia) (HCC)    a. 02/2012 post-MI   Obesity    Pancreatitis    Rhinosinusitis 09/02/2019   S/P primary angioplasty with coronary stent 03/04/2018   Dual antiplatelet therapy INDEFINITELY per cardiology note Nov 2019   Squamous cell carcinoma of back 04/20/2019   referrred to sugery for f/up excision, derm not available for several month, lesion with rapid change cannot wait, still see derm for skin survey etc   Stage 3b chronic kidney disease (HCC) 02/18/2020   Tobacco abuse    Tobacco dependence 02/18/2020    Past Surgical History:  Procedure Laterality Date   ABDOMINAL HYSTERECTOMY     CARDIAC CATHETERIZATION  2013   [facility] s/p stent   CARDIAC CATHETERIZATION Left 08/03/2015   Procedure: Left Heart Cath and Coronary Angiography;  Surgeon: Iran Ouch, MD;  Location: [facility] INVASIVE CV LAB;   Service: Cardiovascular;  Laterality: Left;   DILATION AND CURETTAGE OF UTERUS     LEFT HEART CATHETERIZATION WITH CORONARY ANGIOGRAM N/A 02/11/2012   Procedure: LEFT HEART CATHETERIZATION WITH CORONARY ANGIOGRAM;  Surgeon: Tonny Bollman, MD;  Location: [facility] CATH LAB;  Service: Cardiovascular;  Laterality: N/A;   RENAL INTERVENTION N/A 11/17/2018   Procedure: RENAL INTERVENTION;  Surgeon: Renford Dills, MD;  Location: [facility] INVASIVE CV LAB;  Service: Cardiovascular;  Laterality: N/A;    Current Medications: No outpatient medications have been marked as taking for the 10/10/22 encounter (Appointment) with Sondra Barges, PA-C.   Current Facility-Administered Medications for the 10/10/22 encounter (Appointment) with Sondra Barges, PA-C  Medication   mometasone-formoterol (DULERA) 200-5 MCG/ACT inhaler 2 puff    Allergies:   Patient has no known allergies.   Social History   Socioeconomic History   Marital status: Legally Separated    Spouse name: Not on file   Number of children: Not on file   Years of education: Not on file   Highest education level: Not on file  Occupational History   Not on file  Tobacco Use   Smoking status: Every Day    Packs/day: 0.25    Years: 30.00  Additional pack years: 0.00    Total pack years: 7.50    Types: Cigarettes   Smokeless tobacco: Never  Vaping Use   Vaping Use: Never used  Substance and Sexual Activity   Alcohol use: Not Currently    Comment: Says she previously drank socially - never a heavy drinker.  No etoh since 08/2018.   Drug use: No    Types: Marijuana   Sexual activity: Not on file  Other Topics Concern   Not on file  Social History Narrative   Lives in [place] w/ her dtr.  Does not routinely exercise.  Disabled/doesn't work.   Social Determinants of Health   Financial Resource Strain: Not on file  Food Insecurity: Not on file  Transportation Needs: Not on file  Physical Activity: Not on file  Stress: Not on file  Social  Connections: Not on file     Family History:  The patient's family history includes AAA (abdominal aortic aneurysm) in her father; Cancer in her paternal grandfather; Diabetes in her mother; Hyperlipidemia in her mother; Hypertension in her brother, brother, daughter, father, mother, sister, and sister; Kidney disease in her mother; Supraventricular tachycardia in her sister; Thyroid disease in her father.  ROS:   12-point review of systems is negative unless otherwise noted in the HPI.   EKGs/Labs/Other Studies Reviewed:    Studies reviewed were summarized above. The additional studies were reviewed today:  Renal artery ultrasound 07/02/2022: Largest Aortic Diameter: 2.4 cm    Renal:    Right: Normal size right kidney. No evidence of right renal artery         stenosis. Normal right Resisitive Index. Normal cortical         thickness of right kidney. RRV flow present.  Left:  Normal size of left kidney. No evidence of left renal artery         stenosis. LRV flow present. Normal left Resistive Index.         Normal cortical thickness of the left kidney.  Mesenteric:  Normal Celiac artery and Superior Mesenteric artery findings.  __________  2D echo 02/19/2019:  1. Left ventricular ejection fraction, by visual estimation, is 35 to  40%. The left ventricle has moderately decreased function. There is no  left ventricular hypertrophy. Anterior and Septal wall hypokinesis.   2. Left ventricular diastolic parameters are consistent with Grade II  diastolic dysfunction (pseudonormalization).   3. Mildly dilated left ventricular internal cavity size.   4. Global right ventricle has normal systolic function.The right  ventricular size is mildly enlarged. No increase in right ventricular wall  thickness.   5. Left atrial size was mild-moderately dilated.   6. Right atrial size was mildly dilated.   7. Moderately elevated pulmonary artery systolic pressure.   8. The tricuspid regurgitant  velocity is 2.80 m/s, and with an assumed  right atrial pressure of 20 mmHg, the estimated right ventricular systolic  pressure is moderately elevated at 51.2 mmHg.   9. Small pericardial effusion. __________   2D echo 02/21/2017: - Left ventricle: The cavity size was normal. Systolic function was    moderately to severely reduced. The estimated ejection fraction    was in the range of 30% to 35%. Diffuse hypokinesis with severe    hypokinesis /possible akinesis of the anterior and lateral walls.    Doppler parameters are consistent with abnormal left ventricular    relaxation (grade 1 diastolic dysfunction).  - Mitral valve: There was mild to moderate regurgitation.  -  Left atrium: The atrium was normal in size.  - Right ventricle: Systolic function was normal.  - Tricuspid valve: There was moderate regurgitation.  - Pulmonary arteries: Systolic pressure was mildly elevated. __________   Eugenie Birks MPI 02/14/2017: There was no ST segment deviation noted during stress. No T wave inversion was noted during stress. Defect 1: There is a medium defect of severe severity present in the basal anterior, basal anterolateral, mid anterior and mid anterolateral location. This is an intermediate risk study. The left ventricular ejection fraction is moderately decreased (30-44%). Findings consistent with prior myocardial infarction with mild peri-infarct ischemia. __________   2D echo 10/06/2015: - Left ventricle: The cavity size was normal. Systolic function was    moderately reduced. The estimated ejection fraction was in the    range of 35% to 40%. Hypokinesis of the anterior myocardium.    Hypokinesis of the anteroseptal myocardium. Doppler parameters    are consistent with abnormal left ventricular relaxation (grade 1    diastolic dysfunction).  - Mitral valve: Calcified annulus. There was mild regurgitation.  - Left atrium: The atrium was normal in size.  - Right ventricle: Systolic  function was normal.  - Pulmonary arteries: Systolic pressure was within the normal    range. ___________   LHC 08/03/2015: Ost LM to LM lesion, 10% stenosed. The lesion was previously treated with a stent (unknown type). Mid LAD lesion, 60% stenosed. Mid RCA lesion, 30% stenosed. There is moderate left ventricular systolic dysfunction.   1. Widely patent ostial left main stent with moderate mid LAD stenosis and occluded second diagonal with faint collaterals. The right coronary artery is very large with mild mid stenosis. 2. Moderately reduced LV systolic function with an ejection fraction of 35-40% with significant anterior wall hypokinesis. Mildly elevated left ventricular end-diastolic pressure. 3. Significant radial artery spasm which required increased amounts of sedation.   Recommendations: Continue medical therapy for coronary artery disease and chronic systolic heart failure. We will need to transition her from clonidine to an ACE inhibitor or ARB with close monitoring of renal function. __________   Eugenie Birks MPI 07/28/2015: Pharmacological myocardial perfusion imaging study with attenuation corrected images suggesting mild anterior wall ischemia, mid to apical region Nonattenuation corrected images also with mild mid to apical anterior wall ischemia Patient attempted exercise on bruce protocol though unable to reach target heart rate, changed to lexiscan Anteroseptal wall hypokinesis noted, EF estimated at 33% No EKG changes concerning for ischemia at peak stress or in recovery. Moderate risk scan with mild ischemia as above __________   2D echo 02/12/2012: - Left ventricle: The cavity size was normal. Wall thickness    was increased in a pattern of mild LVH. Systolic function    was mildly to moderately reduced. The estimated ejection    fraction was in the range of 40% to 45%. Hypokinesis of    the entireanterior and lateral myocardium. Doppler    parameters are consistent  with abnormal left ventricular    relaxation (grade 1 diastolic dysfunction).  - Mitral valve: Mild regurgitation.  - Pulmonary arteries: PA peak pressure: 43mm Hg (S).   EKG:  EKG is ordered today.  The EKG ordered today demonstrates ***  Recent Labs: 05/28/2022: ALT 19; BUN 29; Creat 1.50; Hemoglobin 14.2; Platelets 380; Potassium 3.9; Sodium 145; TSH 1.88  Recent Lipid Panel    Component Value Date/Time   CHOL 109 12/27/2021 1506   CHOL 136 02/27/2018 1207   TRIG 128 12/27/2021 1506   HDL  52 12/27/2021 1506   HDL 53 02/27/2018 1207   CHOLHDL 2.1 12/27/2021 1506   VLDL 15 11/13/2018 0628   LDLCALC 36 12/27/2021 1506    PHYSICAL EXAM:    VS:  There were no vitals taken for this visit.  BMI: There is no height or weight on file to calculate BMI.  Physical Exam  Wt Readings from Last 3 Encounters:  09/17/22 175 lb 9.6 oz (79.7 kg)  07/30/22 176 lb 9.6 oz (80.1 kg)  07/04/22 200 lb (90.7 kg)     ASSESSMENT & PLAN:   CAD involving native coronary arteries with ***:  Acute on chronic HFrEF secondary to ICM:  HTN: Blood pressure  HLD: LDL 36 in 12/2021.  Tobacco use:  CKD stage III:  Renal artery stenosis: Renal artery duplex in 06/2022 showed patent left renal artery stent.   {Are you ordering a CV Procedure (e.g. stress test, cath, DCCV, TEE, etc)?   Press F2        :[id number]}     Disposition: F/u with Dr. Kirke Corin or an APP in ***.   Medication Adjustments/Labs and Tests Ordered: Current medicines are reviewed at length with the patient today.  Concerns regarding medicines are outlined above. Medication changes, Labs and Tests ordered today are summarized above and listed in the Patient Instructions accessible in Encounters.   Signed, Eula Listen, PA-C 10/08/2022 4:22 PM     [facility] [place] [address] [place], [place] [address] [phone]

## 2022-10-09 ENCOUNTER — Ambulatory Visit: Payer: Medicaid Other

## 2022-10-09 NOTE — Telephone Encounter (Signed)
Angelica Herrera is aware pt has an appt on 10/15/22 for surgical clearance. Will fax forms back to there office once pt come for the appointment and they are filled out.

## 2022-10-09 NOTE — Telephone Encounter (Unsigned)
Copied from CRM [id number]. Topic: General - Other >> Oct 09, 2022  9:40 AM Franchot Heidelberg wrote: Reason for CRM: Maritza from [facility] called to see if the patient's blood thinner can be held she has a procedure 10/30/2022. Says this has been faxed thrice   Best contact: [phone]  [facility] with Dr. Tobi Bastos

## 2022-10-10 ENCOUNTER — Ambulatory Visit: Payer: Medicaid Other | Admitting: Physician Assistant

## 2022-10-10 ENCOUNTER — Other Ambulatory Visit: Payer: Self-pay | Admitting: Family Medicine

## 2022-10-10 DIAGNOSIS — I10 Essential (primary) hypertension: Secondary | ICD-10-CM

## 2022-10-10 DIAGNOSIS — N183 Chronic kidney disease, stage 3 unspecified: Secondary | ICD-10-CM

## 2022-10-10 DIAGNOSIS — F411 Generalized anxiety disorder: Secondary | ICD-10-CM

## 2022-10-10 DIAGNOSIS — I25118 Atherosclerotic heart disease of native coronary artery with other forms of angina pectoris: Secondary | ICD-10-CM

## 2022-10-10 DIAGNOSIS — F3341 Major depressive disorder, recurrent, in partial remission: Secondary | ICD-10-CM

## 2022-10-10 DIAGNOSIS — E785 Hyperlipidemia, unspecified: Secondary | ICD-10-CM

## 2022-10-10 DIAGNOSIS — Z720 Tobacco use: Secondary | ICD-10-CM

## 2022-10-10 DIAGNOSIS — I5023 Acute on chronic systolic (congestive) heart failure: Secondary | ICD-10-CM

## 2022-10-10 DIAGNOSIS — I701 Atherosclerosis of renal artery: Secondary | ICD-10-CM

## 2022-10-14 DIAGNOSIS — Z419 Encounter for procedure for purposes other than remedying health state, unspecified: Secondary | ICD-10-CM | POA: Diagnosis not present

## 2022-10-14 NOTE — Progress Notes (Deleted)
There were no vitals taken for this visit.   Subjective:    Patient ID: Angelica Herrera, female    DOB: 1970/07/08, 52 y.o.   MRN: [id number]  HPI: Angelica Herrera is a 52 y.o. female  No chief complaint on file.   Relevant past medical, surgical, family and social history reviewed and updated as indicated. Interim medical history since our last visit reviewed. Allergies and medications reviewed and updated.  Review of Systems  Constitutional: Negative for fever or weight change.  Respiratory: Negative for cough and shortness of breath.   Cardiovascular: Negative for chest pain or palpitations.  Gastrointestinal: Negative for abdominal pain, no bowel changes.  Musculoskeletal: Negative for gait problem or joint swelling.  Skin: Negative for rash.  Neurological: Negative for dizziness or headache.  No other specific complaints in a complete review of systems (except as listed in HPI above).      Objective:    There were no vitals taken for this visit.  Wt Readings from Last 3 Encounters:  09/17/22 175 lb 9.6 oz (79.7 kg)  07/30/22 176 lb 9.6 oz (80.1 kg)  07/04/22 200 lb (90.7 kg)    Physical Exam  Constitutional: Patient appears well-developed and well-nourished. Obese *** No distress.  HEENT: head atraumatic, normocephalic, pupils equal and reactive to light, ears ***, neck supple, throat within normal limits Cardiovascular: Normal rate, regular rhythm and normal heart sounds.  No murmur heard. No BLE edema. Pulmonary/Chest: Effort normal and breath sounds normal. No respiratory distress. Abdominal: Soft.  There is no tenderness. Psychiatric: Patient has a normal mood and affect. behavior is normal. Judgment and thought content normal.  Results for orders placed or performed in visit on 05/31/22  Urine Culture   Specimen: Urine  Result Value Ref Range   MICRO NUMBER: [id number]    SPECIMEN QUALITY: Adequate    Sample Source URINE    STATUS: FINAL    ISOLATE  1: Escherichia coli (A)       Susceptibility   Escherichia coli - URINE CULTURE, REFLEX    AMOX/CLAVULANIC >=32 Resistant     AMPICILLIN >=32 Resistant     AMPICILLIN/SULBACTAM 16 Intermediate     CEFAZOLIN* 8 Resistant      * For uncomplicated UTI caused by E. coli, K. pneumoniae or P. mirabilis: Cefazolin is susceptible if MIC <32 mcg/mL and predicts susceptible to the oral agents cefaclor, cefdinir, cefpodoxime, cefprozil, cefuroxime, cephalexin and loracarbef.     CEFTAZIDIME <=1 Sensitive     CEFEPIME <=1 Sensitive     CEFTRIAXONE <=1 Sensitive     CIPROFLOXACIN <=0.25 Sensitive     LEVOFLOXACIN <=0.12 Sensitive     GENTAMICIN <=1 Sensitive     IMIPENEM <=0.25 Sensitive     NITROFURANTOIN <=16 Sensitive     PIP/TAZO <=4 Sensitive     TOBRAMYCIN <=1 Sensitive     TRIMETH/SULFA* <=20 Sensitive      * For uncomplicated UTI caused by E. coli, K. pneumoniae or P. mirabilis: Cefazolin is susceptible if MIC <32 mcg/mL and predicts susceptible to the oral agents cefaclor, cefdinir, cefpodoxime, cefprozil, cefuroxime, cephalexin and loracarbef. Legend: S = Susceptible  I = Intermediate R = Resistant  NS = Not susceptible * = Not tested  NR = Not reported **NN = See antimicrobic comments   POCT urinalysis dipstick  Result Value Ref Range   Color, UA Yellow    Clarity, UA Clear    Glucose, UA Negative Negative   Bilirubin, UA Negative  Ketones, UA Negative    Spec Grav, UA 1.010 1.010 - 1.025   Blood, UA Large    pH, UA 6.0 5.0 - 8.0   Protein, UA Positive (A) Negative   Urobilinogen, UA 0.2 0.2 or 1.0 E.U./dL   Nitrite, UA Positive    Leukocytes, UA Moderate (2+) (A) Negative   Appearance Yellow    Odor Foul       Assessment & Plan:   Problem List Items Addressed This Visit   None    Follow up plan: No follow-ups on file.

## 2022-10-15 ENCOUNTER — Ambulatory Visit: Payer: Medicaid Other | Admitting: Nurse Practitioner

## 2022-10-16 ENCOUNTER — Ambulatory Visit
Admission: RE | Admit: 2022-10-16 | Discharge: 2022-10-16 | Disposition: A | Discharge status: Home / Self Care | Payer: Medicaid Other | Source: Ambulatory Visit | Attending: Gastroenterology | Admitting: Gastroenterology

## 2022-10-16 DIAGNOSIS — R1084 Generalized abdominal pain: Secondary | ICD-10-CM | POA: Diagnosis not present

## 2022-10-16 DIAGNOSIS — R1013 Epigastric pain: Secondary | ICD-10-CM | POA: Diagnosis not present

## 2022-10-30 ENCOUNTER — Other Ambulatory Visit: Payer: Self-pay

## 2022-10-30 ENCOUNTER — Encounter
Admission: RE | Disposition: A | Discharge status: Home / Self Care | Payer: Self-pay | Source: Home / Self Care | Attending: Gastroenterology

## 2022-10-30 ENCOUNTER — Ambulatory Visit: Payer: Medicaid Other | Admitting: Registered Nurse

## 2022-10-30 ENCOUNTER — Ambulatory Visit
Admission: RE | Admit: 2022-10-30 | Discharge: 2022-10-30 | Disposition: A | Discharge status: Home / Self Care | Payer: Medicaid Other | Attending: Gastroenterology | Admitting: Gastroenterology

## 2022-10-30 ENCOUNTER — Encounter: Payer: Self-pay | Admitting: Gastroenterology

## 2022-10-30 DIAGNOSIS — M109 Gout, unspecified: Secondary | ICD-10-CM | POA: Diagnosis not present

## 2022-10-30 DIAGNOSIS — J449 Chronic obstructive pulmonary disease, unspecified: Secondary | ICD-10-CM | POA: Insufficient documentation

## 2022-10-30 DIAGNOSIS — N184 Chronic kidney disease, stage 4 (severe): Secondary | ICD-10-CM | POA: Insufficient documentation

## 2022-10-30 DIAGNOSIS — I5042 Chronic combined systolic (congestive) and diastolic (congestive) heart failure: Secondary | ICD-10-CM | POA: Insufficient documentation

## 2022-10-30 DIAGNOSIS — D126 Benign neoplasm of colon, unspecified: Secondary | ICD-10-CM | POA: Diagnosis not present

## 2022-10-30 DIAGNOSIS — E785 Hyperlipidemia, unspecified: Secondary | ICD-10-CM | POA: Diagnosis not present

## 2022-10-30 DIAGNOSIS — I2510 Atherosclerotic heart disease of native coronary artery without angina pectoris: Secondary | ICD-10-CM | POA: Diagnosis not present

## 2022-10-30 DIAGNOSIS — Z800 Family history of malignant neoplasm of digestive organs: Secondary | ICD-10-CM | POA: Diagnosis not present

## 2022-10-30 DIAGNOSIS — E039 Hypothyroidism, unspecified: Secondary | ICD-10-CM | POA: Diagnosis not present

## 2022-10-30 DIAGNOSIS — F411 Generalized anxiety disorder: Secondary | ICD-10-CM | POA: Insufficient documentation

## 2022-10-30 DIAGNOSIS — R1084 Generalized abdominal pain: Secondary | ICD-10-CM

## 2022-10-30 DIAGNOSIS — N1832 Chronic kidney disease, stage 3b: Secondary | ICD-10-CM | POA: Diagnosis not present

## 2022-10-30 DIAGNOSIS — F17210 Nicotine dependence, cigarettes, uncomplicated: Secondary | ICD-10-CM | POA: Insufficient documentation

## 2022-10-30 DIAGNOSIS — I252 Old myocardial infarction: Secondary | ICD-10-CM | POA: Insufficient documentation

## 2022-10-30 DIAGNOSIS — D123 Benign neoplasm of transverse colon: Secondary | ICD-10-CM | POA: Insufficient documentation

## 2022-10-30 DIAGNOSIS — I130 Hypertensive heart and chronic kidney disease with heart failure and stage 1 through stage 4 chronic kidney disease, or unspecified chronic kidney disease: Secondary | ICD-10-CM | POA: Insufficient documentation

## 2022-10-30 DIAGNOSIS — I255 Ischemic cardiomyopathy: Secondary | ICD-10-CM | POA: Diagnosis not present

## 2022-10-30 DIAGNOSIS — Z1211 Encounter for screening for malignant neoplasm of colon: Secondary | ICD-10-CM | POA: Diagnosis not present

## 2022-10-30 DIAGNOSIS — K635 Polyp of colon: Secondary | ICD-10-CM | POA: Diagnosis not present

## 2022-10-30 DIAGNOSIS — D120 Benign neoplasm of cecum: Secondary | ICD-10-CM | POA: Diagnosis not present

## 2022-10-30 DIAGNOSIS — K2970 Gastritis, unspecified, without bleeding: Secondary | ICD-10-CM | POA: Diagnosis not present

## 2022-10-30 HISTORY — PX: BIOPSY: SHX5522

## 2022-10-30 HISTORY — PX: ESOPHAGOGASTRODUODENOSCOPY (EGD) WITH PROPOFOL: SHX5813

## 2022-10-30 HISTORY — PX: COLONOSCOPY WITH PROPOFOL: SHX5780

## 2022-10-30 HISTORY — PX: POLYPECTOMY: SHX5525

## 2022-10-30 SURGERY — COLONOSCOPY WITH PROPOFOL
Anesthesia: General

## 2022-10-30 MED ORDER — PROPOFOL 10 MG/ML IV BOLUS
INTRAVENOUS | Status: DC | PRN
Start: 2022-10-30 — End: 2022-10-30
  Administered 2022-10-30: 50 mg via INTRAVENOUS
  Administered 2022-10-30: 100 mg via INTRAVENOUS
  Administered 2022-10-30 (×2): 50 mg via INTRAVENOUS

## 2022-10-30 MED ORDER — PHENYLEPHRINE 80 MCG/ML (10ML) SYRINGE FOR IV PUSH (FOR BLOOD PRESSURE SUPPORT)
PREFILLED_SYRINGE | INTRAVENOUS | Status: DC | PRN
  Administered 2022-10-30 (×2): 160 ug via INTRAVENOUS
  Administered 2022-10-30: 240 ug via INTRAVENOUS
  Administered 2022-10-30: 200 ug via INTRAVENOUS
  Administered 2022-10-30: 80 ug via INTRAVENOUS
  Administered 2022-10-30: 240 ug via INTRAVENOUS
  Administered 2022-10-30: 80 ug via INTRAVENOUS

## 2022-10-30 MED ORDER — LIDOCAINE HCL (CARDIAC) PF 100 MG/5ML IV SOSY
PREFILLED_SYRINGE | INTRAVENOUS | Status: DC | PRN
  Administered 2022-10-30: 100 mg via INTRAVENOUS

## 2022-10-30 MED ORDER — SODIUM CHLORIDE 0.9 % IV SOLN: INTRAVENOUS | Status: DC

## 2022-10-30 MED ORDER — PROPOFOL 500 MG/50ML IV EMUL
INTRAVENOUS | Status: DC | PRN
  Administered 2022-10-30: 100 ug/kg/min via INTRAVENOUS

## 2022-10-30 NOTE — Transfer of Care (Signed)
Immediate Anesthesia Transfer of Care Note  Patient: Angelica Herrera  Procedure(s) Performed: COLONOSCOPY WITH PROPOFOL ESOPHAGOGASTRODUODENOSCOPY (EGD) WITH PROPOFOL POLYPECTOMY  Patient Location: PACU and Endoscopy Unit  Anesthesia Type:General  Level of Consciousness: drowsy and patient cooperative  Airway & Oxygen Therapy: Patient Spontanous Breathing and Patient connected to face mask oxygen  Post-op Assessment: Report given to RN and Patient moving all extremities X 4  Post vital signs: Reviewed and stable  Last Vitals:  Vitals Value Taken Time  BP 97/69 10/30/22 1333  Temp 36.8 C 10/30/22 1328  Pulse 75 10/30/22 1334  Resp 20 10/30/22 1334  SpO2 100 % 10/30/22 1334  Vitals shown include unfiled device data.  Last Pain:  Vitals:   10/30/22 1328  TempSrc: Temporal  PainSc: Asleep         Complications: No notable events documented.

## 2022-10-30 NOTE — Anesthesia Procedure Notes (Signed)
Procedure Name: General with mask airway Date/Time: 10/30/2022 1:00 PM  Performed by: Lily Lovings, CRNAPre-anesthesia Checklist: Patient identified, Suction available, Emergency Drugs available, Patient being monitored and Timeout performed Oxygen Delivery Method: Simple face mask Preoxygenation: Pre-oxygenation with 100% oxygen Induction Type: IV induction

## 2022-10-30 NOTE — Anesthesia Postprocedure Evaluation (Signed)
Anesthesia Post Note  Patient: STEPHANE JUNKINS  Procedure(s) Performed: COLONOSCOPY WITH PROPOFOL ESOPHAGOGASTRODUODENOSCOPY (EGD) WITH PROPOFOL POLYPECTOMY  Patient location during evaluation: Endoscopy Anesthesia Type: General Level of consciousness: awake and alert Pain management: pain level controlled Vital Signs Assessment: post-procedure vital signs reviewed and stable Respiratory status: spontaneous breathing, nonlabored ventilation, respiratory function stable and patient connected to nasal cannula oxygen Cardiovascular status: blood pressure returned to baseline and stable Postop Assessment: no apparent nausea or vomiting Anesthetic complications: no   No notable events documented.   Last Vitals:  Vitals:   10/30/22 1328 10/30/22 1348  BP: 97/69 114/80  Pulse: 72   Resp: 20   Temp: 36.8 C   SpO2: 100%     Last Pain:  Vitals:   10/30/22 1348  TempSrc:   PainSc: 0-No pain                 Corinda Gubler

## 2022-10-30 NOTE — Anesthesia Preprocedure Evaluation (Addendum)
Anesthesia Evaluation  Patient identified by MRN, date of birth, ID band Patient awake    Reviewed: Allergy & Precautions, NPO status , Patient's Chart, lab work & pertinent test results  History of Anesthesia Complications Negative for: history of anesthetic complications  Airway Mallampati: III  TM Distance: >3 FB Neck ROM: Full    Dental no notable dental hx. (+) Teeth Intact   Pulmonary neg sleep apnea, COPD, Current SmokerPatient did not abstain from smoking.   Pulmonary exam normal breath sounds clear to auscultation       Cardiovascular Exercise Tolerance: Good METShypertension, + CAD and +CHF  (-) Past MI (-) dysrhythmias  Rhythm:Regular Rate:Normal - Systolic murmurs TTE 2020: 1. Left ventricular ejection fraction, by visual estimation, is 35 to  40%. The left ventricle has moderately decreased function. There is no  left ventricular hypertrophy. Anterior and Septal wall hypokinesis.   2. Left ventricular diastolic parameters are consistent with Grade II  diastolic dysfunction (pseudonormalization).   3. Mildly dilated left ventricular internal cavity size.   4. Global right ventricle has normal systolic function.The right  ventricular size is mildly enlarged. No increase in right ventricular wall  thickness.   5. Left atrial size was mild-moderately dilated.   6. Right atrial size was mildly dilated.   7. Moderately elevated pulmonary artery systolic pressure.   8. The tricuspid regurgitant velocity is 2.80 m/s, and with an assumed  right atrial pressure of 20 mmHg, the estimated right ventricular systolic  pressure is moderately elevated at 51.2 mmHg.   9. Small pericardial effusion.     Neuro/Psych  PSYCHIATRIC DISORDERS Anxiety Depression    negative neurological ROS     GI/Hepatic ,neg GERD  ,,(+)     (-) substance abuse    Endo/Other  neg diabetesHypothyroidism    Renal/GU CRFRenal disease      Musculoskeletal   Abdominal   Peds  Hematology   Anesthesia Other Findings Past Medical History: 11/13/2018: Accelerated hypertension 06/28/2015: Allergic rhinitis due to pollen 02/18/2020: Anxiety with depression No date: Arthritis     Comment:  gout 11/12/2018: Chest pain No date: Chronic combined systolic (congestive) and diastolic  (congestive) heart failure (HCC)     Comment:  a. 02/2017 Echo: EF 30-35%, diff HK, sev HK/poss AK of               ant and lat walls. Gr1 DD. Mild to mod MR. Nl RV fxn. Mod              TR. 09/02/2019: Chronic obstructive pulmonary disease (HCC)     Comment:  tx with trelegy and SABA 11/21/2017: Chronic systolic heart failure (HCC) No date: CKD (chronic kidney disease), stage IV (HCC) 09/07/2015: Controlled substance agreement signed No date: Coronary artery disease involving native coronary artery 03/17/2015: Degenerative disc disease, cervical 12/02/2017: Depression, recurrent (HCC) 02/12/2012: Essential hypertension, malignant 03/17/2015: GAD (generalized anxiety disorder) 02/12/2012: History of acute anterior wall MI No date: Hyperlipidemia No date: Hypothyroidism No date: Ischemic cardiomyopathy     Comment:  a. 02/2012 Echo: EF 40-45%, ant/lat HK, Gr1 DD, Mild MR,              PASP ; b. 02/2017 Echo: EF 30-35%. No date: NSVT (nonsustained ventricular tachycardia) (HCC)     Comment:  a. 02/2012 post-MI No date: Obesity No date: Pancreatitis 09/02/2019: Rhinosinusitis 03/04/2018: S/P primary angioplasty with coronary stent     Comment:  Dual antiplatelet therapy INDEFINITELY per cardiology  note Nov 2019 04/20/2019: Squamous cell carcinoma of back     Comment:  referrred to sugery for f/up excision, derm not               available for several month, lesion with rapid change               cannot wait, still see derm for skin survey etc 02/18/2020: Stage 3b chronic kidney disease (HCC) No date: Tobacco abuse 02/18/2020:  Tobacco dependence  Reproductive/Obstetrics                             Anesthesia Physical Anesthesia Plan  ASA: 3  Anesthesia Plan: General   Post-op Pain Management: Minimal or no pain anticipated   Induction: Intravenous  PONV Risk Score and Plan: 2 and Propofol infusion, TIVA and Ondansetron  Airway Management Planned: Nasal Cannula  Additional Equipment: None  Intra-op Plan:   Post-operative Plan:   Informed Consent: I have reviewed the patients History and Physical, chart, labs and discussed the procedure including the risks, benefits and alternatives for the proposed anesthesia with the patient or authorized representative who has indicated his/her understanding and acceptance.     Dental advisory given  Plan Discussed with: CRNA and Surgeon  Anesthesia Plan Comments: (Discussed risks of anesthesia with patient, including possibility of difficulty with spontaneous ventilation under anesthesia necessitating airway intervention, PONV, and rare risks such as cardiac or respiratory or neurological events, and allergic reactions. Discussed the role of CRNA in patient's perioperative care. Patient understands.)       Anesthesia Quick Evaluation

## 2022-10-30 NOTE — Op Note (Signed)
[facility] Patient Name: Angelica Herrera Procedure Date: 10/30/2022 12:36 PM MRN: [id number] Account #: [id number] Date of Birth: 12-02-1970 Admit Type: Outpatient Age: 52 Room: [facility] ENDO ROOM 3 Gender: Female Note Status: Finalized Instrument Name: Patton Salles Endoscope [id number] Procedure:             Upper GI endoscopy Indications:           Abdominal pain Providers:             Wyline Mood MD, MD Medicines:             Monitored Anesthesia Care Complications:         No immediate complications. Procedure:             Pre-Anesthesia Assessment:                        - Prior to the procedure, a History and Physical was                         performed, and patient medications, allergies and                         sensitivities were reviewed. The patient's tolerance                         of previous anesthesia was reviewed.                        - The risks and benefits of the procedure and the                         sedation options and risks were discussed with the                         patient. All questions were answered and informed                         consent was obtained.                        - ASA Grade Assessment: II - A patient with mild                         systemic disease.                        After obtaining informed consent, the endoscope was                         passed under direct vision. Throughout the procedure,                         the patient's blood pressure, pulse, and oxygen                         saturations were monitored continuously. The Endoscope                         was introduced through the mouth, and advanced to the  third part of duodenum. The upper GI endoscopy was                         accomplished with ease. The patient tolerated the                         procedure well. Findings:      The esophagus was normal.      The examined duodenum was normal.       Patchy moderate inflammation characterized by congestion (edema),       erythema and aphthous ulcerations was found on the greater curvature of       the gastric antrum. Biopsies were taken with a cold forceps for       histology.      The cardia and gastric fundus were normal on retroflexion. Impression:            - Normal esophagus.                        - Normal examined duodenum.                        - Gastritis. Biopsied. Recommendation:        - Await pathology results.                        - Perform a colonoscopy today. Procedure Code(s):     --- Professional ---                        [phone], Esophagogastroduodenoscopy, flexible,                         transoral; with biopsy, single or multiple Diagnosis Code(s):     --- Professional ---                        K29.70, Gastritis, unspecified, without bleeding                        R10.9, Unspecified abdominal pain CPT copyright 2022 American Medical Association. All rights reserved. The codes documented in this report are preliminary and upon coder review may  be revised to meet current compliance requirements. Wyline Mood, MD Wyline Mood MD, MD 10/30/2022 1:08:22 PM This report has been signed electronically. Number of Addenda: 0 Note Initiated On: 10/30/2022 12:36 PM Estimated Blood Loss:  Estimated blood loss: none.      [facility]

## 2022-10-30 NOTE — Op Note (Signed)
[facility] Patient Name: Angelica Herrera Procedure Date: 10/30/2022 12:35 PM MRN: [id number] Account #: [id number] Date of Birth: 03-26-1971 Admit Type: Outpatient Age: 52 Room: [facility] ENDO ROOM 3 Gender: Female Note Status: Finalized Instrument Name: Prentice Docker [id number] Procedure:             Colonoscopy Indications:           Screening for colorectal malignant neoplasm Providers:             Wyline Mood MD, MD Medicines:             Monitored Anesthesia Care Complications:         No immediate complications. Procedure:             Pre-Anesthesia Assessment:                        - Prior to the procedure, a History and Physical was                         performed, and patient medications, allergies and                         sensitivities were reviewed. The patient's tolerance                         of previous anesthesia was reviewed.                        - The risks and benefits of the procedure and the                         sedation options and risks were discussed with the                         patient. All questions were answered and informed                         consent was obtained.                        - ASA Grade Assessment: II - A patient with mild                         systemic disease.                        After obtaining informed consent, the colonoscope was                         passed under direct vision. Throughout the procedure,                         the patient's blood pressure, pulse, and oxygen                         saturations were monitored continuously. The                         Colonoscope was introduced through the anus and  advanced to the the cecum, identified by appendiceal                         orifice and ileocecal valve. The colonoscopy was                         performed with ease. The patient tolerated the                         procedure well. The quality of  the bowel preparation                         was excellent. The ileocecal valve, appendiceal                         orifice, and rectum were photographed. Findings:      The perianal and digital rectal examinations were normal.      Two sessile polyps were found in the transverse colon and cecum. The       polyps were 3 to 4 mm in size. These polyps were removed with a jumbo       cold forceps. Resection and retrieval were complete.      An 8 mm polyp was found in the cecum. The polyp was sessile. The polyp       was removed with a cold snare. Resection and retrieval were complete.      The exam was otherwise without abnormality on direct and retroflexion       views. Impression:            - Two 3 to 4 mm polyps in the transverse colon and in                         the cecum, removed with a jumbo cold forceps. Resected                         and retrieved.                        - One 8 mm polyp in the cecum, removed with a cold                         snare. Resected and retrieved.                        - The examination was otherwise normal on direct and                         retroflexion views. Recommendation:        - Discharge patient to home (with escort).                        - Resume previous diet.                        - Continue present medications.                        - Await pathology results.                        -  Repeat colonoscopy for surveillance based on                         pathology results. Procedure Code(s):     --- Professional ---                        [phone], Colonoscopy, flexible; with removal of                         tumor(s), polyp(s), or other lesion(s) by snare                         technique                        [id number], 59, Colonoscopy, flexible; with biopsy, single                         or multiple Diagnosis Code(s):     --- Professional ---                        Z12.11, Encounter for screening for malignant neoplasm                          of colon                        D12.3, Benign neoplasm of transverse colon (hepatic                         flexure or splenic flexure)                        D12.0, Benign neoplasm of cecum CPT copyright 2022 American Medical Association. All rights reserved. The codes documented in this report are preliminary and upon coder review may  be revised to meet current compliance requirements. Wyline Mood, MD Wyline Mood MD, MD 10/30/2022 1:27:15 PM This report has been signed electronically. Number of Addenda: 0 Note Initiated On: 10/30/2022 12:35 PM Scope Withdrawal Time: 0 hours 13 minutes 13 seconds  Total Procedure Duration: 0 hours 15 minutes 47 seconds  Estimated Blood Loss:  Estimated blood loss: none.      [facility]

## 2022-10-30 NOTE — H&P (Signed)
Wyline Mood, MD [address], [address], [place], [place], [address] [address], [address], [place], [place], [address] Phone: [phone]  Fax: [phone]  Primary Care Physician:  Danelle Berry, PA-C   Pre-Procedure History & Physical: HPI:  Angelica Herrera is a 52 y.o. female is here for an endoscopy and colonoscopy    Past Medical History:  Diagnosis Date   Accelerated hypertension 11/13/2018   Allergic rhinitis due to pollen 06/28/2015   Anxiety with depression 02/18/2020   Arthritis    gout   Chest pain 11/12/2018   Chronic combined systolic (congestive) and diastolic (congestive) heart failure (HCC)    a. 02/2017 Echo: EF 30-35%, diff HK, sev HK/poss AK of ant and lat walls. Gr1 DD. Mild to mod MR. Nl RV fxn. Mod TR.   Chronic obstructive pulmonary disease (HCC) 09/02/2019   tx with trelegy and SABA   Chronic systolic heart failure (HCC) 11/21/2017   CKD (chronic kidney disease), stage IV (HCC)    Controlled substance agreement signed 09/07/2015   Coronary artery disease involving native coronary artery    Degenerative disc disease, cervical 03/17/2015   Depression, recurrent (HCC) 12/02/2017   Essential hypertension, malignant 02/12/2012   GAD (generalized anxiety disorder) 03/17/2015   History of acute anterior wall MI 02/12/2012   Hyperlipidemia    Hypothyroidism    Ischemic cardiomyopathy    a. 02/2012 Echo: EF 40-45%, ant/lat HK, Gr1 DD, Mild MR, PASP ; b. 02/2017 Echo: EF 30-35%.   NSVT (nonsustained ventricular tachycardia) (HCC)    a. 02/2012 post-MI   Obesity    Pancreatitis    Rhinosinusitis 09/02/2019   S/P primary angioplasty with coronary stent 03/04/2018   Dual antiplatelet therapy INDEFINITELY per cardiology note Nov 2019   Squamous cell carcinoma of back 04/20/2019   referrred to sugery for f/up excision, derm not available for several month, lesion with rapid change cannot wait, still see derm for skin survey etc   Stage 3b chronic kidney  disease (HCC) 02/18/2020   Tobacco abuse    Tobacco dependence 02/18/2020    Past Surgical History:  Procedure Laterality Date   ABDOMINAL HYSTERECTOMY     CARDIAC CATHETERIZATION  2013   [facility] s/p stent   CARDIAC CATHETERIZATION Left 08/03/2015   Procedure: Left Heart Cath and Coronary Angiography;  Surgeon: Iran Ouch, MD;  Location: [facility] INVASIVE CV LAB;  Service: Cardiovascular;  Laterality: Left;   DILATION AND CURETTAGE OF UTERUS     LEFT HEART CATHETERIZATION WITH CORONARY ANGIOGRAM N/A 02/11/2012   Procedure: LEFT HEART CATHETERIZATION WITH CORONARY ANGIOGRAM;  Surgeon: Tonny Bollman, MD;  Location: [facility] CATH LAB;  Service: Cardiovascular;  Laterality: N/A;   RENAL INTERVENTION N/A 11/17/2018   Procedure: RENAL INTERVENTION;  Surgeon: Renford Dills, MD;  Location: [facility] INVASIVE CV LAB;  Service: Cardiovascular;  Laterality: N/A;    Prior to Admission medications   Medication Sig Start Date End Date Taking? Authorizing Provider  acetaminophen (TYLENOL) 500 MG tablet Take 1,000 mg by mouth every 6 (six) hours as needed.   Yes [provider]  aspirin 81 MG tablet Take 1 tablet (81 mg total) by mouth daily. 02/15/12  Yes Creig Hines, NP  atorvastatin (LIPITOR) 80 MG tablet Take 1 tablet (80 mg total) by mouth at bedtime. 07/30/22  Yes Danelle Berry, PA-C  busPIRone (BUSPAR) 5 MG tablet TAKE 1-3 TABLETS BY MOUTH TWICE DAILY AS NEEDED. 07/04/22  Yes Danelle Berry, PA-C  carvedilol (COREG) 25  MG tablet TAKE (1) TABLET BY MOUTH TWICE DAILY 09/30/22  Yes Dunn, Raymon Mutton, PA-C  cetirizine (ZYRTEC) 10 MG tablet TAKE (1) TABLET BY MOUTH DAILY AT BEDTIME 11/01/21  Yes Berniece Salines, FNP  colchicine 0.6 MG tablet Take 1 tablet (0.6 mg total) by mouth daily as needed (gout). 06/01/21  Yes Margarita Mail, DO  famotidine (PEPCID) 20 MG tablet Take 1 tablet (20 mg total) by mouth 2 (two) times daily as needed for heartburn or indigestion. 05/28/22  Yes Danelle Berry, PA-C   gabapentin (NEURONTIN) 300 MG capsule Take 1 capsule (300 mg total) by mouth 2 (two) times daily. 07/30/22  Yes Danelle Berry, PA-C  levothyroxine (SYNTHROID) 125 MCG tablet TAKE (1) TABLET BY MOUTH EVERY MORNING BEFORE BREAKFAST 07/04/22  Yes Danelle Berry, PA-C  pantoprazole (PROTONIX) 40 MG tablet Take 1 tablet (40 mg total) by mouth 2 (two) times daily. Empty stomach - take more than 4 hours after taking levothyroxine 07/30/22  Yes Danelle Berry, PA-C  PARoxetine (PAXIL) 40 MG tablet TAKE (1) TABLET BY MOUTH EVERY DAY 10/10/22  Yes Danelle Berry, PA-C  potassium chloride SA (KLOR-CON M) 20 MEQ tablet Take 1 tablet (20 mEq total) by mouth daily. 07/04/22 06/29/23 Yes Iran Ouch, MD  sacubitril-valsartan (ENTRESTO) 97-103 MG Take 1 tablet by mouth 2 (two) times daily. 07/04/22  Yes Iran Ouch, MD  torsemide 40 MG TABS Take 40 mg by mouth daily. 07/04/22  Yes Iran Ouch, MD  clopidogrel (PLAVIX) 75 MG tablet TAKE (1) TABLET BY MOUTH EVERY DAY 07/30/22   Danelle Berry, PA-C  nitroGLYCERIN (NITROSTAT) 0.4 MG SL tablet Place 1 tablet (0.4 mg total) under the tongue every 5 (five) minutes x 3 doses as needed for chest pain. 08/25/19   Alver Sorrow, NP  ondansetron (ZOFRAN-ODT) 4 MG disintegrating tablet Take 1 tablet (4 mg total) by mouth every 8 (eight) hours as needed for nausea or vomiting. 07/30/22   Danelle Berry, PA-C    Allergies as of 09/17/2022   (No Known Allergies)    Family History  Problem Relation Age of Onset   Hypertension Father    Thyroid disease Father    AAA (abdominal aortic aneurysm) Father    Hypertension Mother    Hyperlipidemia Mother    Diabetes Mother    Kidney disease Mother    Hypertension Sister    Hypertension Sister    Supraventricular tachycardia Sister    Hypertension Brother    Hypertension Brother    Hypertension Daughter    Cancer Paternal Grandfather        lung    Social History   Socioeconomic History   Marital status: Legally  Separated    Spouse name: Not on file   Number of children: Not on file   Years of education: Not on file   Highest education level: Not on file  Occupational History   Not on file  Tobacco Use   Smoking status: Every Day    Current packs/day: 0.25    Average packs/day: 0.3 packs/day for 30.0 years (7.5 ttl pk-yrs)    Types: Cigarettes   Smokeless tobacco: Never  Vaping Use   Vaping status: Never Used  Substance and Sexual Activity   Alcohol use: Not Currently    Comment: Says she previously drank socially - never a heavy drinker.  No etoh since 08/2018.   Drug use: No    Types: Marijuana   Sexual activity: Not on file  Other Topics Concern  Not on file  Social History Narrative   Lives in [place] w/ her dtr.  Does not routinely exercise.  Disabled/doesn't work.   Social Determinants of Health   Financial Resource Strain: Not on file  Food Insecurity: Not on file  Transportation Needs: Not on file  Physical Activity: Not on file  Stress: Not on file  Social Connections: Not on file  Intimate Partner Violence: Not on file    Review of Systems: See HPI, otherwise negative ROS  Physical Exam: BP 119/82   Pulse 76   Temp (!) 97.2 F (36.2 C) (Temporal)   Wt 78.4 kg   SpO2 99%   BMI 27.89 kg/m  General:   Alert,  pleasant and cooperative in NAD Head:  Normocephalic and atraumatic. Neck:  Supple; no masses or thyromegaly. Lungs:  Clear throughout to auscultation, normal respiratory effort.    Heart:  +S1, +S2, Regular rate and rhythm, No edema. Abdomen:  Soft, nontender and nondistended. Normal bowel sounds, without guarding, and without rebound.   Neurologic:  Alert and  oriented x4;  grossly normal neurologically.  Impression/Plan: Angelica Herrera is here for an endoscopy and colonoscopy  to be performed for  evaluation of abdominal pain and colon cancer screening     Risks, benefits, limitations, and alternatives regarding endoscopy have been reviewed with  the patient.  Questions have been answered.  All parties agreeable.   Wyline Mood, MD  10/30/2022, 12:55 PM

## 2022-10-31 ENCOUNTER — Encounter: Payer: Self-pay | Admitting: Gastroenterology

## 2022-11-06 ENCOUNTER — Encounter: Payer: Self-pay | Admitting: Gastroenterology

## 2022-11-14 ENCOUNTER — Other Ambulatory Visit: Payer: Self-pay | Admitting: Nurse Practitioner

## 2022-11-14 DIAGNOSIS — Z419 Encounter for procedure for purposes other than remedying health state, unspecified: Secondary | ICD-10-CM | POA: Diagnosis not present

## 2022-11-14 DIAGNOSIS — J329 Chronic sinusitis, unspecified: Secondary | ICD-10-CM

## 2022-11-14 NOTE — Progress Notes (Signed)
Cardiology Office Note    Date:  11/15/2022   ID:  KYRIN GARN, DOB 1970/11/18, MRN [id number]  PCP:  Danelle Berry, PA-C  Cardiologist:  Lorine Bears, MD  Electrophysiologist:  None   Chief Complaint: Follow-up  History of Present Illness:   Angelica Herrera is a 52 y.o. female with history of CAD with anterolateral ST elevation MI in 01/2012 with PTCA/DES to the left main at that time as detailed below complicated by hemodynamic instability and VT, HFrEF secondary to ICM, left renal artery stenosis status post left renal artery stenting in 11/2018 by vascular surgery, CKD stage III, HLD, hypothyroidism, depression, and tobacco use who presents for follow-up of her CAD and cardiomyopathy.   She was admitted to the hospital in 01/2012 with an anterolateral STEMI. She underwent emergent cardiac catheterization which showed occlusion of the left main coronary artery. There was significant difficulty engaging the left main, though she ultimately underwent successful PCI/DES. She also had PTCA done to the LAD due to embolization from the left main. Post procedure she had significant hemodynamic instablity with VT and hypotension. She was noted to have severe MR initially, however subsequent echocardiogram showed an EF of 45% with no significant MR. She was lost to follow up from 2013-2017. Upon re-establishing with our group in 2017, she continued to smoke and had been off her medications. In 2017, she had atypical chest pain with a Lexiscan MPI showing prior anterior infarct with mild peri-infarct ischemia and an EF of 33%. She underwent repeat cardiac cath at that time which showed a patent left main stent with moderate mid LAD stenosis and an occluded D2 with faint collaterals. The RCA was very large with mild mid stenosis. EF was 35-40% with anterior wall hypokinesis. Continued medical therapy was advised. She underwent repeat nuclear stress testing in 02/2017 for recurrent chest pain that  showed a similar finding of prior anterior infarct with mild peri-infarct ischemia.  Echo at that time demonstrated an EF of 30 to 35%, diffuse hypokinesis with severe hypokinesis/possible akinesis of the anterior and lateral walls, grade 1 diastolic dysfunction, mild to moderate mitral regurgitation, normal RV systolic function, and mildly elevated PASP.  Most recent echo from 02/2019 demonstrated an EF of 35 to 40%, anterior and septal wall hypokinesis, grade 2 diastolic dysfunction, mildly dilated LV internal cavity size, normal RV systolic function with mildly enlarged ventricular cavity, mildly to moderately dilated left atrium, mildly dilated right atrium, moderately elevated PASP estimated at 51.2 mmHg.   She was seen in the office in 02/2021 and had been out of carvedilol, Entresto, and furosemide.  She was without symptoms of angina or decompensation.  She continued to smoke one fourth a pack of cigarettes per day.  She had gained significant weight over the preceding 6 months.  GDMT was reinitiated.   She was seen in the office in 08/2021 and remained without symptoms of angina or decompensation.  She reported frequently missing her p.m. doses of Entresto and carvedilol.  Blood pressure was well-controlled at home, though BP was elevated in the office at 188/122 which was felt to be in the setting of frequently missing medications and [facility] intake.  She continued to smoke a fourth of a pack of cigarettes per day.  Smoking cessation and medication adherence were recommended.   She was seen in the ED on 05/16/2022 with pain and swelling involving left knee without a known trauma.  Plain film imaging showed a moderate joint effusion with  no acute fracture or dislocation.  She was placed in a hinged knee brace with recommendation to follow-up with orthopedics.  She was last seen in the office on 07/04/2022 and had ran out of [place] 3 days prior.  She was taking clonidine 0.1 mg twice daily.  She  reported shortness of breath and lower extremity swelling over the past month with a 10 pound weight gain.  She was taking furosemide 20 mg once daily and had even doubled to 40 mg daily for a few days without significant change.  She reported worsening chest pain.  Her weight was up 8 pounds at that visit when compared to her visit in 05/2022.  Sherryll Burger was refilled and she was transition from furosemide to torsemide 40 mg daily.  She comes in today noting significant improvement in her dyspnea and resolution of chest pain.  Her weight is down 21 pounds today by our scale when compared to her last visit in the office in 06/2022.  Feels back to baseline.  No progressive orthopnea or lower extremity swelling.  Has been out of aspirin for several weeks.  She did note some dizziness following her last visit.  This improved after she realized she was taking 25 mg twice daily of carvedilol rather than 12.5 mg twice daily.     Labs independently reviewed: 05/2022 - TSH normal, Hgb 14.2, PLT 380, BUN 29, serum creatinine 1.5, potassium 3.9, AST/ALT normal, albumin 3.8 12/2021 - TC 109, TG 128, HDL 52, LDL 36  01/2021 - Z6X 5.5   Past Medical History:  Diagnosis Date   Accelerated hypertension 11/13/2018   Allergic rhinitis due to pollen 06/28/2015   Anxiety with depression 02/18/2020   Arthritis    gout   Chest pain 11/12/2018   Chronic combined systolic (congestive) and diastolic (congestive) heart failure (HCC)    a. 02/2017 Echo: EF 30-35%, diff HK, sev HK/poss AK of ant and lat walls. Gr1 DD. Mild to mod MR. Nl RV fxn. Mod TR.   Chronic obstructive pulmonary disease (HCC) 09/02/2019   tx with trelegy and SABA   Chronic systolic heart failure (HCC) 11/21/2017   CKD (chronic kidney disease), stage IV (HCC)    Controlled substance agreement signed 09/07/2015   Coronary artery disease involving native coronary artery    Degenerative disc disease, cervical 03/17/2015   Depression, recurrent (HCC) 12/02/2017    Essential hypertension, malignant 02/12/2012   GAD (generalized anxiety disorder) 03/17/2015   History of acute anterior wall MI 02/12/2012   Hyperlipidemia    Hypothyroidism    Ischemic cardiomyopathy    a. 02/2012 Echo: EF 40-45%, ant/lat HK, Gr1 DD, Mild MR, PASP ; b. 02/2017 Echo: EF 30-35%.   NSVT (nonsustained ventricular tachycardia) (HCC)    a. 02/2012 post-MI   Obesity    Pancreatitis    Rhinosinusitis 09/02/2019   S/P primary angioplasty with coronary stent 03/04/2018   Dual antiplatelet therapy INDEFINITELY per cardiology note Nov 2019   Squamous cell carcinoma of back 04/20/2019   referrred to sugery for f/up excision, derm not available for several month, lesion with rapid change cannot wait, still see derm for skin survey etc   Stage 3b chronic kidney disease (HCC) 02/18/2020   Tobacco abuse    Tobacco dependence 02/18/2020    Past Surgical History:  Procedure Laterality Date   ABDOMINAL HYSTERECTOMY     BIOPSY  10/30/2022   Procedure: BIOPSY;  Surgeon: Wyline Mood, MD;  Location: [facility] ENDOSCOPY;  Service: Gastroenterology;;  CARDIAC CATHETERIZATION  2013   [facility] s/p stent   CARDIAC CATHETERIZATION Left 08/03/2015   Procedure: Left Heart Cath and Coronary Angiography;  Surgeon: Iran Ouch, MD;  Location: [facility] INVASIVE CV LAB;  Service: Cardiovascular;  Laterality: Left;   COLONOSCOPY WITH PROPOFOL N/A 10/30/2022   Procedure: COLONOSCOPY WITH PROPOFOL;  Surgeon: Wyline Mood, MD;  Location: [facility] ENDOSCOPY;  Service: Gastroenterology;  Laterality: N/A;   DILATION AND CURETTAGE OF UTERUS     ESOPHAGOGASTRODUODENOSCOPY (EGD) WITH PROPOFOL N/A 10/30/2022   Procedure: ESOPHAGOGASTRODUODENOSCOPY (EGD) WITH PROPOFOL;  Surgeon: Wyline Mood, MD;  Location: [facility] ENDOSCOPY;  Service: Gastroenterology;  Laterality: N/A;   LEFT HEART CATHETERIZATION WITH CORONARY ANGIOGRAM N/A 02/11/2012   Procedure: LEFT HEART CATHETERIZATION WITH CORONARY ANGIOGRAM;  Surgeon: Tonny Bollman,  MD;  Location[facility] CATH LAB;  Service: Cardiovascular;  Laterality: N/A;   POLYPECTOMY  10/30/2022   Procedure: POLYPECTOMY;  Surgeon: Wyline Mood, MD;  Location: [facility] ENDOSCOPY;  Service: Gastroenterology;;   RENAL INTERVENTION N/A 11/17/2018   Procedure: RENAL INTERVENTION;  Surgeon: Renford Dills, MD;  Location: [facility] INVASIVE CV LAB;  Service: Cardiovascular;  Laterality: N/A;    Current Medications: Current Meds  Medication Sig   acetaminophen (TYLENOL) 500 MG tablet Take 1,000 mg by mouth every 6 (six) hours as needed.   aspirin 81 MG tablet Take 1 tablet (81 mg total) by mouth daily.   atorvastatin (LIPITOR) 80 MG tablet Take 1 tablet (80 mg total) by mouth at bedtime.   busPIRone (BUSPAR) 5 MG tablet TAKE 1-3 TABLETS BY MOUTH TWICE DAILY AS NEEDED.   carvedilol (COREG) 25 MG tablet TAKE (1) TABLET BY MOUTH TWICE DAILY (Patient taking differently: Take 12.5 mg by mouth 2 (two) times daily with a meal.)   cetirizine (ZYRTEC) 10 MG tablet TAKE (1) TABLET BY MOUTH  AT BEDTIME   clopidogrel (PLAVIX) 75 MG tablet TAKE (1) TABLET BY MOUTH EVERY DAY   colchicine 0.6 MG tablet Take 1 tablet (0.6 mg total) by mouth daily as needed (gout).   famotidine (PEPCID) 20 MG tablet Take 1 tablet (20 mg total) by mouth 2 (two) times daily as needed for heartburn or indigestion.   gabapentin (NEURONTIN) 300 MG capsule Take 1 capsule (300 mg total) by mouth 2 (two) times daily.   levothyroxine (SYNTHROID) 125 MCG tablet TAKE (1) TABLET BY MOUTH EVERY MORNING BEFORE BREAKFAST   nicotine (NICODERM CQ - DOSED IN MG/24 HR) 7 mg/24hr patch Place 1 patch (7 mg total) onto the skin daily.   nitroGLYCERIN (NITROSTAT) 0.4 MG SL tablet Place 1 tablet (0.4 mg total) under the tongue every 5 (five) minutes x 3 doses as needed for chest pain.   ondansetron (ZOFRAN-ODT) 4 MG disintegrating tablet Take 1 tablet (4 mg total) by mouth every 8 (eight) hours as needed for nausea or vomiting.   pantoprazole (PROTONIX) 40 MG  tablet Take 1 tablet (40 mg total) by mouth 2 (two) times daily. Empty stomach - take more than 4 hours after taking levothyroxine   PARoxetine (PAXIL) 40 MG tablet TAKE (1) TABLET BY MOUTH EVERY DAY   potassium chloride SA (KLOR-CON M) 20 MEQ tablet Take 1 tablet (20 mEq total) by mouth daily.   sacubitril-valsartan (ENTRESTO) 97-103 MG Take 1 tablet by mouth 2 (two) times daily.   torsemide 40 MG TABS Take 40 mg by mouth daily.   Current Facility-Administered Medications for the 11/15/22 encounter (Office Visit) with Sondra Barges, PA-C  Medication   mometasone-formoterol (DULERA) 200-5 MCG/ACT  inhaler 2 puff    Allergies:   Patient has no known allergies.   Social History   Socioeconomic History   Marital status: Legally Separated    Spouse name: Not on file   Number of children: Not on file   Years of education: Not on file   Highest education level: Not on file  Occupational History   Not on file  Tobacco Use   Smoking status: Every Day    Current packs/day: 0.25    Average packs/day: 0.3 packs/day for 30.0 years (7.5 ttl pk-yrs)    Types: Cigarettes   Smokeless tobacco: Never  Vaping Use   Vaping status: Never Used  Substance and Sexual Activity   Alcohol use: Not Currently    Comment: Says she previously drank socially - never a heavy drinker.  No etoh since 08/2018.   Drug use: No    Types: Marijuana   Sexual activity: Not on file  Other Topics Concern   Not on file  Social History Narrative   Lives in [place] w/ her dtr.  Does not routinely exercise.  Disabled/doesn't work.   Social Determinants of Health   Financial Resource Strain: Not on file  Food Insecurity: Not on file  Transportation Needs: Not on file  Physical Activity: Not on file  Stress: Not on file  Social Connections: Not on file     Family History:  The patient's family history includes AAA (abdominal aortic aneurysm) in her father; Cancer in her paternal grandfather; Diabetes in her mother;  Hyperlipidemia in her mother; Hypertension in her brother, brother, daughter, father, mother, sister, and sister; Kidney disease in her mother; Supraventricular tachycardia in her sister; Thyroid disease in her father.  ROS:   12-point review of systems is negative unless otherwise noted in the HPI.   EKGs/Labs/Other Studies Reviewed:    Studies reviewed were summarized above. The additional studies were reviewed today:  Renal artery ultrasound 07/02/2022: Summary:  Largest Aortic Diameter: 2.4 cm    Renal:    Right: Normal size right kidney. No evidence of right renal artery         stenosis. Normal right Resisitive Index. Normal cortical         thickness of right kidney. RRV flow present.  Left:  Normal size of left kidney. No evidence of left renal artery         stenosis. LRV flow present. Normal left Resistive Index.         Normal cortical thickness of the left kidney.  Mesenteric:  Normal Celiac artery and Superior Mesenteric artery findings.  __________  2D echo 02/19/2019:  1. Left ventricular ejection fraction, by visual estimation, is 35 to  40%. The left ventricle has moderately decreased function. There is no  left ventricular hypertrophy. Anterior and Septal wall hypokinesis.   2. Left ventricular diastolic parameters are consistent with Grade II  diastolic dysfunction (pseudonormalization).   3. Mildly dilated left ventricular internal cavity size.   4. Global right ventricle has normal systolic function.The right  ventricular size is mildly enlarged. No increase in right ventricular wall  thickness.   5. Left atrial size was mild-moderately dilated.   6. Right atrial size was mildly dilated.   7. Moderately elevated pulmonary artery systolic pressure.   8. The tricuspid regurgitant velocity is 2.80 m/s, and with an assumed  right atrial pressure of 20 mmHg, the estimated right ventricular systolic  pressure is moderately elevated at 51.2 mmHg.   9. Small  pericardial effusion. __________   2D echo 02/21/2017: - Left ventricle: The cavity size was normal. Systolic function was    moderately to severely reduced. The estimated ejection fraction    was in the range of 30% to 35%. Diffuse hypokinesis with severe    hypokinesis /possible akinesis of the anterior and lateral walls.    Doppler parameters are consistent with abnormal left ventricular    relaxation (grade 1 diastolic dysfunction).  - Mitral valve: There was mild to moderate regurgitation.  - Left atrium: The atrium was normal in size.  - Right ventricle: Systolic function was normal.  - Tricuspid valve: There was moderate regurgitation.  - Pulmonary arteries: Systolic pressure was mildly elevated. __________   Eugenie Birks MPI 02/14/2017: There was no ST segment deviation noted during stress. No T wave inversion was noted during stress. Defect 1: There is a medium defect of severe severity present in the basal anterior, basal anterolateral, mid anterior and mid anterolateral location. This is an intermediate risk study. The left ventricular ejection fraction is moderately decreased (30-44%). Findings consistent with prior myocardial infarction with mild peri-infarct ischemia. __________   2D echo 10/06/2015: - Left ventricle: The cavity size was normal. Systolic function was    moderately reduced. The estimated ejection fraction was in the    range of 35% to 40%. Hypokinesis of the anterior myocardium.    Hypokinesis of the anteroseptal myocardium. Doppler parameters    are consistent with abnormal left ventricular relaxation (grade 1    diastolic dysfunction).  - Mitral valve: Calcified annulus. There was mild regurgitation.  - Left atrium: The atrium was normal in size.  - Right ventricle: Systolic function was normal.  - Pulmonary arteries: Systolic pressure was within the normal    range. ___________   LHC 08/03/2015: Ost LM to LM lesion, 10% stenosed. The lesion was  previously treated with a stent (unknown type). Mid LAD lesion, 60% stenosed. Mid RCA lesion, 30% stenosed. There is moderate left ventricular systolic dysfunction.   1. Widely patent ostial left main stent with moderate mid LAD stenosis and occluded second diagonal with faint collaterals. The right coronary artery is very large with mild mid stenosis. 2. Moderately reduced LV systolic function with an ejection fraction of 35-40% with significant anterior wall hypokinesis. Mildly elevated left ventricular end-diastolic pressure. 3. Significant radial artery spasm which required increased amounts of sedation.   Recommendations: Continue medical therapy for coronary artery disease and chronic systolic heart failure. We will need to transition her from clonidine to an ACE inhibitor or ARB with close monitoring of renal function. __________   Eugenie Birks MPI 07/28/2015: Pharmacological myocardial perfusion imaging study with attenuation corrected images suggesting mild anterior wall ischemia, mid to apical region Nonattenuation corrected images also with mild mid to apical anterior wall ischemia Patient attempted exercise on bruce protocol though unable to reach target heart rate, changed to lexiscan Anteroseptal wall hypokinesis noted, EF estimated at 33% No EKG changes concerning for ischemia at peak stress or in recovery. Moderate risk scan with mild ischemia as above __________   2D echo 02/12/2012: - Left ventricle: The cavity size was normal. Wall thickness    was increased in a pattern of mild LVH. Systolic function    was mildly to moderately reduced. The estimated ejection    fraction was in the range of 40% to 45%. Hypokinesis of    the entireanterior and lateral myocardium. Doppler    parameters are consistent with abnormal left ventricular  relaxation (grade 1 diastolic dysfunction).  - Mitral valve: Mild regurgitation.  - Pulmonary arteries: PA peak pressure: 43mm Hg  (S).   EKG:  EKG is not ordered today.    Recent Labs: 05/28/2022: ALT 19; BUN 29; Creat 1.50; Hemoglobin 14.2; Platelets 380; Potassium 3.9; Sodium 145; TSH 1.88  Recent Lipid Panel    Component Value Date/Time   CHOL 109 12/27/2021 1506   CHOL 136 02/27/2018 1207   TRIG 128 12/27/2021 1506   HDL 52 12/27/2021 1506   HDL 53 02/27/2018 1207   CHOLHDL 2.1 12/27/2021 1506   VLDL 15 11/13/2018 0628   LDLCALC 36 12/27/2021 1506    PHYSICAL EXAM:    VS:  BP 100/78 (BP Location: Left Arm, Patient Position: Sitting, Cuff Size: Normal)   Pulse 70   Ht 5\' 7"  (1.702 m)   Wt 179 lb (81.2 kg)   SpO2 96%   BMI 28.04 kg/m   BMI: Body mass index is 28.04 kg/m.  Physical Exam Vitals reviewed.  Constitutional:      Appearance: She is well-developed.  HENT:     Head: Normocephalic and atraumatic.  Eyes:     General:        Right eye: No discharge.        Left eye: No discharge.  Neck:     Vascular: No JVD.  Cardiovascular:     Rate and Rhythm: Normal rate and regular rhythm.     Heart sounds: Normal heart sounds, S1 normal and S2 normal. Heart sounds not distant. No midsystolic click and no opening snap. No murmur heard.    No friction rub.  Pulmonary:     Effort: Pulmonary effort is normal. No respiratory distress.     Breath sounds: Normal breath sounds. No decreased breath sounds, wheezing or rales.  Chest:     Chest wall: No tenderness.  Abdominal:     General: There is no distension.  Musculoskeletal:     Cervical back: Normal range of motion.     Right lower leg: No edema.     Left lower leg: No edema.  Skin:    General: Skin is warm and dry.     Nails: There is no clubbing.  Neurological:     Mental Status: She is alert and oriented to person, place, and time.  Psychiatric:        Speech: Speech normal.        Behavior: Behavior normal.        Thought Content: Thought content normal.        Judgment: Judgment normal.     Wt Readings from Last 3 Encounters:   11/15/22 179 lb (81.2 kg)  10/30/22 172 lb 12.8 oz (78.4 kg)  09/17/22 175 lb 9.6 oz (79.7 kg)     ASSESSMENT & PLAN:   CAD involving native coronary arteries without angina: Symptoms of chest discomfort and dyspnea have resolved following outpatient diuresis.  Given resolution of symptoms, and in the context of underlying renal dysfunction, defer R/LHC at this time.  Continue aggressive risk factor modification and secondary prevention including aspirin (she indicates she will resume this today) and clopidogrel long-term given previous left main stent along with continuation of carvedilol, atorvastatin, and as needed SL NTG.  HFrEF secondary to ICM: Volume status much improved following outpatient diuresis.  Query if she is volume depleted currently.  She reports that she has been taking 2 torsemide tabs daily, though is unclear of the strength.  At last  visit, she was transitioned from furosemide to torsemide 40 mg daily.  Her weight is down 21 pounds (trend 200 at visit in 06/2022 to 179 pounds today).  Check BMP.  She will contact our office when she gets home to update [place] on strength torsemide.  Suspect we will need to de-escalate diuretic regimen.  He remains on carvedilol 12.5 mg twice daily and Entresto 97/23 mg twice daily.  Not currently on MRA or SGLT2i secondary to underlying renal dysfunction and current relative hypotension.  HTN: Blood pressure is on the low side in the office today.  She is asymptomatic.  Query volume depletion as outlined above.  For now, she remains on carvedilol 12.5 mg twice daily and Entresto 97/23 mg twice daily.  HLD: LDL 36.  She remains on Lipitor.   CKD stage IIIb: Check BMP today.   Tobacco use: Continues to smoke.  Reports nausea with 14 mg nicotine patches.  7 mg nicotine patches sent in.  Renal artery stenosis: Status post left renal artery stenting.  Renal artery duplex in 06/2022 showed patent stent.   Disposition: F/u with Dr. Kirke Corin or an APP in  3 months.   Medication Adjustments/Labs and Tests Ordered: Current medicines are reviewed at length with the patient today.  Concerns regarding medicines are outlined above. Medication changes, Labs and Tests ordered today are summarized above and listed in the Patient Instructions accessible in Encounters.   SignedEula Listen, PA-C 11/15/2022 12:28 PM     [facility] [place] [address] [place], [place] [address] [phone]

## 2022-11-15 ENCOUNTER — Ambulatory Visit: Payer: Medicaid Other | Attending: Physician Assistant | Admitting: Physician Assistant

## 2022-11-15 ENCOUNTER — Encounter: Payer: Self-pay | Admitting: Physician Assistant

## 2022-11-15 ENCOUNTER — Telehealth: Payer: Self-pay | Admitting: *Deleted

## 2022-11-15 VITALS — BP 100/78 | HR 70 | Ht 67.0 in | Wt 179.0 lb

## 2022-11-15 DIAGNOSIS — I255 Ischemic cardiomyopathy: Secondary | ICD-10-CM | POA: Diagnosis not present

## 2022-11-15 DIAGNOSIS — Z720 Tobacco use: Secondary | ICD-10-CM

## 2022-11-15 DIAGNOSIS — I5022 Chronic systolic (congestive) heart failure: Secondary | ICD-10-CM | POA: Diagnosis not present

## 2022-11-15 DIAGNOSIS — I10 Essential (primary) hypertension: Secondary | ICD-10-CM

## 2022-11-15 DIAGNOSIS — I25118 Atherosclerotic heart disease of native coronary artery with other forms of angina pectoris: Secondary | ICD-10-CM | POA: Diagnosis not present

## 2022-11-15 DIAGNOSIS — N1832 Chronic kidney disease, stage 3b: Secondary | ICD-10-CM

## 2022-11-15 DIAGNOSIS — I701 Atherosclerosis of renal artery: Secondary | ICD-10-CM

## 2022-11-15 DIAGNOSIS — E785 Hyperlipidemia, unspecified: Secondary | ICD-10-CM

## 2022-11-15 DIAGNOSIS — I2510 Atherosclerotic heart disease of native coronary artery without angina pectoris: Secondary | ICD-10-CM

## 2022-11-15 DIAGNOSIS — I5023 Acute on chronic systolic (congestive) heart failure: Secondary | ICD-10-CM | POA: Diagnosis not present

## 2022-11-15 MED ORDER — NICOTINE 7 MG/24HR TD PT24: 7.0000 mg | MEDICATED_PATCH | Freq: Every day | TRANSDERMAL | 3 refills | Status: DC

## 2022-11-15 NOTE — Patient Instructions (Addendum)
Medication Instructions:  Your physician has recommended you make the following change in your medication:   Start Taking: Nicotine patch 7 mg daily  *If you need a refill on your cardiac medications before your next appointment, please call your pharmacy*   Lab Work: Your provider would like for you to have following labs drawn today BMP.   If you have labs (blood work) drawn today and your tests are completely normal, you will receive your results only by: MyChart Message (if you have MyChart) OR A paper copy in the mail If you have any lab test that is abnormal or we need to change your treatment, we will call you to review the results.   Testing/Procedures: none   Follow-Up: At [facility], you and your health needs are our priority.  As part of our continuing mission to provide you with exceptional heart care, we have created designated Provider Care Teams.  These Care Teams include your primary Cardiologist (physician) and Advanced Practice Providers (APPs -  Physician Assistants and Nurse Practitioners) who all work together to provide you with the care you need, when you need it.  We recommend signing up for the patient portal called "MyChart".  Sign up information is provided on this After Visit Summary.  MyChart is used to connect with patients for Virtual Visits (Telemedicine).  Patients are able to view lab/test results, encounter notes, upcoming appointments, etc.  Non-urgent messages can be sent to your provider as well.   To learn more about what you can do with MyChart, go to [email or web address].    Your next appointment:   3 month(s)  Provider:   You may see Lorine Bears, MD or one of the following Advanced Practice Providers on your designated Care Team:   Eula Listen, [place]

## 2022-11-15 NOTE — Telephone Encounter (Signed)
error 

## 2022-11-15 NOTE — Telephone Encounter (Signed)
Requested Prescriptions  Pending Prescriptions Disp Refills   cetirizine (ZYRTEC) 10 MG tablet [Pharmacy Med Name: CETIRIZINE TAB 10MG ] 30 tablet 6    Sig: TAKE (1) TABLET BY MOUTH  AT BEDTIME     Ear, Nose, and Throat:  Antihistamines 2 Failed - 11/14/2022 10:19 AM      Failed - Cr in normal range and within 360 days    Creat  Date Value Ref Range Status  05/28/2022 1.50 (H) 0.50 - 1.03 mg/dL Final         Passed - Valid encounter within last 12 months    Recent Outpatient Visits           3 months ago Hyperlipidemia LDL goal <70   [facility] Danelle Berry, PA-C   5 months ago Urinary tract infection with hematuria, site unspecified   [facility] Danelle Berry, PA-C   5 months ago Other specified hypothyroidism   [facility] Danelle Berry, PA-C   10 months ago Essential hypertension, malignant   [facility] Danelle Berry, PA-C   10 months ago Essential hypertension, malignant   [facility] Danelle Berry, [place]       Future Appointments             Today Dunn, Raymon Mutton, PA-C [facility] [place]   In 1 month Danelle Berry, PA-C [facility]

## 2022-11-18 ENCOUNTER — Telehealth: Payer: Self-pay | Admitting: *Deleted

## 2022-11-18 DIAGNOSIS — I5022 Chronic systolic (congestive) heart failure: Secondary | ICD-10-CM

## 2022-11-18 DIAGNOSIS — I2510 Atherosclerotic heart disease of native coronary artery without angina pectoris: Secondary | ICD-10-CM

## 2022-11-18 DIAGNOSIS — N1832 Chronic kidney disease, stage 3b: Secondary | ICD-10-CM

## 2022-11-18 MED ORDER — TORSEMIDE 20 MG PO TABS: 20.0000 mg | ORAL_TABLET | Freq: Every day | ORAL | 3 refills | Status: DC

## 2022-11-18 NOTE — Telephone Encounter (Addendum)
Reviewed results and recommendations with patient. Inquired how she was taking her torsemide. She reports that she takes 2 tablets once daily. Instructed her to hold torsemide for 2 days and then restart only taking 1 tablet (20 mg) once daily. We also discussed need to stop potassium and repeat labs this Friday. She verbalized understanding of our conversation with no further questions at this time.       ----- Message from Nicolasa Ducking sent at 11/18/2022  8:36 AM EDT ----- BUN and creatinine above prior baseline (more abnormal), which Ryan suspected might be the case given significant weight loss.   Recommend that she hold torsemide for 2 days and then resume at 20mg  daily (Ryan's note indicated that she was taking 40 mg daily - pls confirm).   Stop Potassium supplement.   F/u BMET on Friday.

## 2022-11-20 ENCOUNTER — Other Ambulatory Visit: Payer: Self-pay

## 2022-11-22 ENCOUNTER — Ambulatory Visit: Payer: Medicaid Other | Admitting: Physician Assistant

## 2022-11-22 NOTE — Progress Notes (Deleted)
Angelica Amy, PA-C [address]  [address]  [place], [place] [address]  Main: [phone]  Fax: [phone]   Primary Care Physician: Danelle Berry, PA-C  Primary Gastroenterologist:  Angelica Amy, PA-C / Dr. Wyline Mood    CC: F/U Abdominal Pain  HPI: Angelica Herrera is a 52 y.o. female returns for 45-month follow-up.  She saw Dr. Tobi Bastos 09/2022 to evaluate abdominal pain and schedule for her first screening colonoscopy.  She has family history of colon cancer father age 86s.  Colonoscopy 10/30/2022 by Dr. Tobi Bastos showed 3 polyps removed (size 3 mm to 8 mm).  Excellent prep. 2 tubular adenomas and 1 hyperplastic polyp.  EGD 10/30/2022 showed moderate gastritis with nonbleeding apthous ulcers, normal esophagus and duodenum.  Stomach biopsies showed reactive gastritis.  Negative for H. pylori or metaplasia.    History of episodic right and left-sided abdominal pain for many years.  Occurs twice per week and last several hours.  Felt like pressure.  Occasional hard stool.  06/04/2022 ultrasound abdomen showed gallbladder is contracted 3.9 mm thick neck 05/28/2022 CMP LFTs normal.  Creatinine 1.5.  Hemoglobin 14.2.  Lipase normal, TSH normal   Current Outpatient Medications  Medication Sig Dispense Refill   acetaminophen (TYLENOL) 500 MG tablet Take 1,000 mg by mouth every 6 (six) hours as needed.     aspirin 81 MG tablet Take 1 tablet (81 mg total) by mouth daily. 30 tablet    atorvastatin (LIPITOR) 80 MG tablet Take 1 tablet (80 mg total) by mouth at bedtime. 90 tablet 2   busPIRone (BUSPAR) 5 MG tablet TAKE 1-3 TABLETS BY MOUTH TWICE DAILY AS NEEDED. 120 tablet 2   carvedilol (COREG) 25 MG tablet TAKE (1) TABLET BY MOUTH TWICE DAILY (Patient taking differently: Take 12.5 mg by mouth 2 (two) times daily with a meal.) 60 tablet 0   cetirizine (ZYRTEC) 10 MG tablet TAKE (1) TABLET BY MOUTH  AT BEDTIME 30 tablet 6   clopidogrel (PLAVIX) 75 MG tablet TAKE (1) TABLET BY MOUTH EVERY DAY  90 tablet 1   colchicine 0.6 MG tablet Take 1 tablet (0.6 mg total) by mouth daily as needed (gout). 15 tablet 0   famotidine (PEPCID) 20 MG tablet Take 1 tablet (20 mg total) by mouth 2 (two) times daily as needed for heartburn or indigestion. 60 tablet 2   gabapentin (NEURONTIN) 300 MG capsule Take 1 capsule (300 mg total) by mouth 2 (two) times daily. 180 capsule 1   levothyroxine (SYNTHROID) 125 MCG tablet TAKE (1) TABLET BY MOUTH EVERY MORNING BEFORE BREAKFAST 90 tablet 1   Na Sulfate-K Sulfate-Mg Sulf 17.5-3.13-1.6 GM/177ML SOLN Take by mouth.     nicotine (NICODERM CQ - DOSED IN MG/24 HR) 7 mg/24hr patch Place 1 patch (7 mg total) onto the skin daily. 28 patch 3   nitroGLYCERIN (NITROSTAT) 0.4 MG SL tablet Place 1 tablet (0.4 mg total) under the tongue every 5 (five) minutes x 3 doses as needed for chest pain. 25 tablet 3   ondansetron (ZOFRAN-ODT) 4 MG disintegrating tablet Take 1 tablet (4 mg total) by mouth every 8 (eight) hours as needed for nausea or vomiting. 20 tablet 0   pantoprazole (PROTONIX) 40 MG tablet Take 1 tablet (40 mg total) by mouth 2 (two) times daily. Empty stomach - take more than 4 hours after taking levothyroxine 60 tablet 1   PARoxetine (PAXIL) 40 MG tablet TAKE (1) TABLET BY MOUTH EVERY DAY 90 tablet 1   potassium  chloride SA (KLOR-CON M) 20 MEQ tablet Take 1 tablet (20 mEq total) by mouth daily. 90 tablet 3   sacubitril-valsartan (ENTRESTO) 97-103 MG Take 1 tablet by mouth 2 (two) times daily. 60 tablet 11   torsemide (DEMADEX) 20 MG tablet Take 1 tablet (20 mg total) by mouth daily. 90 tablet 3   Current Facility-Administered Medications  Medication Dose Route Frequency Provider Last Rate Last Admin   mometasone-formoterol (DULERA) 200-5 MCG/ACT inhaler 2 puff  2 puff Inhalation BID Danelle Berry, PA-C        Allergies as of 11/22/2022   (No Known Allergies)    Past Medical History:  Diagnosis Date   Accelerated hypertension 11/13/2018   Allergic rhinitis  due to pollen 06/28/2015   Anxiety with depression 02/18/2020   Arthritis    gout   Chest pain 11/12/2018   Chronic combined systolic (congestive) and diastolic (congestive) heart failure (HCC)    a. 02/2017 Echo: EF 30-35%, diff HK, sev HK/poss AK of ant and lat walls. Gr1 DD. Mild to mod MR. Nl RV fxn. Mod TR.   Chronic obstructive pulmonary disease (HCC) 09/02/2019   tx with trelegy and SABA   Chronic systolic heart failure (HCC) 11/21/2017   CKD (chronic kidney disease), stage IV (HCC)    Controlled substance agreement signed 09/07/2015   Coronary artery disease involving native coronary artery    Degenerative disc disease, cervical 03/17/2015   Depression, recurrent (HCC) 12/02/2017   Essential hypertension, malignant 02/12/2012   GAD (generalized anxiety disorder) 03/17/2015   History of acute anterior wall MI 02/12/2012   Hyperlipidemia    Hypothyroidism    Ischemic cardiomyopathy    a. 02/2012 Echo: EF 40-45%, ant/lat HK, Gr1 DD, Mild MR, PASP ; b. 02/2017 Echo: EF 30-35%.   NSVT (nonsustained ventricular tachycardia) (HCC)    a. 02/2012 post-MI   Obesity    Pancreatitis    Rhinosinusitis 09/02/2019   S/P primary angioplasty with coronary stent 03/04/2018   Dual antiplatelet therapy INDEFINITELY per cardiology note Nov 2019   Squamous cell carcinoma of back 04/20/2019   referrred to sugery for f/up excision, derm not available for several month, lesion with rapid change cannot wait, still see derm for skin survey etc   Stage 3b chronic kidney disease (HCC) 02/18/2020   Tobacco abuse    Tobacco dependence 02/18/2020    Past Surgical History:  Procedure Laterality Date   ABDOMINAL HYSTERECTOMY     BIOPSY  10/30/2022   Procedure: BIOPSY;  Surgeon: Wyline Mood, MD;  Location: [facility] ENDOSCOPY;  Service: Gastroenterology;;   CARDIAC CATHETERIZATION  2013   [facility] s/p stent   CARDIAC CATHETERIZATION Left 08/03/2015   Procedure: Left Heart Cath and Coronary Angiography;  Surgeon:  Iran Ouch, MD;  Location: [facility] INVASIVE CV LAB;  Service: Cardiovascular;  Laterality: Left;   COLONOSCOPY WITH PROPOFOL N/A 10/30/2022   Procedure: COLONOSCOPY WITH PROPOFOL;  Surgeon: Wyline Mood, MD;  Location: [facility] ENDOSCOPY;  Service: Gastroenterology;  Laterality: N/A;   DILATION AND CURETTAGE OF UTERUS     ESOPHAGOGASTRODUODENOSCOPY (EGD) WITH PROPOFOL N/A 10/30/2022   Procedure: ESOPHAGOGASTRODUODENOSCOPY (EGD) WITH PROPOFOL;  Surgeon: Wyline Mood, MD;  Location: [facility] ENDOSCOPY;  Service: Gastroenterology;  Laterality: N/A;   LEFT HEART CATHETERIZATION WITH CORONARY ANGIOGRAM N/A 02/11/2012   Procedure: LEFT HEART CATHETERIZATION WITH CORONARY ANGIOGRAM;  Surgeon: Tonny Bollman, MD;  Location: [facility] CATH LAB;  Service: Cardiovascular;  Laterality: N/A;   POLYPECTOMY  10/30/2022   Procedure: POLYPECTOMY;  Surgeon: Wyline Mood,  MD;  Location: [facility] ENDOSCOPY;  Service: Gastroenterology;;   RENAL INTERVENTION N/A 11/17/2018   Procedure: RENAL INTERVENTION;  Surgeon: Renford Dills, MD;  Location: [facility] INVASIVE CV LAB;  Service: Cardiovascular;  Laterality: N/A;    Review of Systems:    All systems reviewed and negative except where noted in HPI.   Physical Examination:   There were no vitals taken for this visit.  General: Well-nourished, well-developed in no acute distress.  Eyes: No icterus. Conjunctivae pink. Mouth: Oropharyngeal mucosa moist and pink , no lesions erythema or exudate. Lungs: Clear to auscultation bilaterally. Non-labored. Heart: Regular rate and rhythm, no murmurs rubs or gallops.  Abdomen: Bowel sounds are normal; Abdomen is Soft; No hepatosplenomegaly, masses or hernias;  No Abdominal Tenderness; No guarding or rebound tenderness. Extremities: No lower extremity edema. No clubbing or deformities. Neuro: Alert and oriented x 3.  Grossly intact. Skin: Warm and dry, no jaundice.   Psych: Alert and cooperative, normal mood and affect.   Imaging Studies: No  results found.  Assessment and Plan:   Angelica Herrera is a 52 y.o. y/o female ***    Angelica Amy, PA-C  Follow up ***  BP check ***

## 2022-12-12 ENCOUNTER — Other Ambulatory Visit: Payer: Self-pay | Admitting: Family Medicine

## 2022-12-12 DIAGNOSIS — F411 Generalized anxiety disorder: Secondary | ICD-10-CM

## 2022-12-12 DIAGNOSIS — F3341 Major depressive disorder, recurrent, in partial remission: Secondary | ICD-10-CM

## 2022-12-15 DIAGNOSIS — Z419 Encounter for procedure for purposes other than remedying health state, unspecified: Secondary | ICD-10-CM | POA: Diagnosis not present

## 2022-12-24 ENCOUNTER — Emergency Department: Payer: Medicaid Other

## 2022-12-24 ENCOUNTER — Inpatient Hospital Stay: Payer: Medicaid Other

## 2022-12-24 ENCOUNTER — Other Ambulatory Visit: Payer: Self-pay

## 2022-12-24 ENCOUNTER — Inpatient Hospital Stay
Admission: EM | Admit: 2022-12-24 | Discharge: 2022-12-29 | DRG: 438 | Disposition: A | Discharge status: Home / Self Care | Payer: Medicaid Other | Attending: Obstetrics and Gynecology | Admitting: Obstetrics and Gynecology

## 2022-12-24 ENCOUNTER — Encounter: Payer: Self-pay | Admitting: Emergency Medicine

## 2022-12-24 DIAGNOSIS — Z7951 Long term (current) use of inhaled steroids: Secondary | ICD-10-CM

## 2022-12-24 DIAGNOSIS — R059 Cough, unspecified: Secondary | ICD-10-CM | POA: Diagnosis not present

## 2022-12-24 DIAGNOSIS — Z79899 Other long term (current) drug therapy: Secondary | ICD-10-CM

## 2022-12-24 DIAGNOSIS — R109 Unspecified abdominal pain: Secondary | ICD-10-CM | POA: Diagnosis not present

## 2022-12-24 DIAGNOSIS — F17210 Nicotine dependence, cigarettes, uncomplicated: Secondary | ICD-10-CM | POA: Diagnosis present

## 2022-12-24 DIAGNOSIS — K219 Gastro-esophageal reflux disease without esophagitis: Secondary | ICD-10-CM | POA: Diagnosis present

## 2022-12-24 DIAGNOSIS — I10 Essential (primary) hypertension: Secondary | ICD-10-CM | POA: Diagnosis not present

## 2022-12-24 DIAGNOSIS — R748 Abnormal levels of other serum enzymes: Secondary | ICD-10-CM | POA: Diagnosis not present

## 2022-12-24 DIAGNOSIS — Z841 Family history of disorders of kidney and ureter: Secondary | ICD-10-CM

## 2022-12-24 DIAGNOSIS — Z8249 Family history of ischemic heart disease and other diseases of the circulatory system: Secondary | ICD-10-CM

## 2022-12-24 DIAGNOSIS — I081 Rheumatic disorders of both mitral and tricuspid valves: Secondary | ICD-10-CM | POA: Diagnosis present

## 2022-12-24 DIAGNOSIS — K0889 Other specified disorders of teeth and supporting structures: Secondary | ICD-10-CM | POA: Diagnosis not present

## 2022-12-24 DIAGNOSIS — Z9889 Other specified postprocedural states: Secondary | ICD-10-CM

## 2022-12-24 DIAGNOSIS — F339 Major depressive disorder, recurrent, unspecified: Secondary | ICD-10-CM | POA: Diagnosis present

## 2022-12-24 DIAGNOSIS — Z91148 Patient's other noncompliance with medication regimen for other reason: Secondary | ICD-10-CM

## 2022-12-24 DIAGNOSIS — Z720 Tobacco use: Secondary | ICD-10-CM | POA: Diagnosis present

## 2022-12-24 DIAGNOSIS — I2489 Other forms of acute ischemic heart disease: Secondary | ICD-10-CM

## 2022-12-24 DIAGNOSIS — R935 Abnormal findings on diagnostic imaging of other abdominal regions, including retroperitoneum: Secondary | ICD-10-CM | POA: Diagnosis not present

## 2022-12-24 DIAGNOSIS — I5022 Chronic systolic (congestive) heart failure: Secondary | ICD-10-CM | POA: Diagnosis not present

## 2022-12-24 DIAGNOSIS — I272 Pulmonary hypertension, unspecified: Secondary | ICD-10-CM | POA: Diagnosis not present

## 2022-12-24 DIAGNOSIS — I472 Ventricular tachycardia, unspecified: Secondary | ICD-10-CM | POA: Diagnosis present

## 2022-12-24 DIAGNOSIS — N179 Acute kidney failure, unspecified: Secondary | ICD-10-CM

## 2022-12-24 DIAGNOSIS — E869 Volume depletion, unspecified: Secondary | ICD-10-CM | POA: Diagnosis present

## 2022-12-24 DIAGNOSIS — J441 Chronic obstructive pulmonary disease with (acute) exacerbation: Secondary | ICD-10-CM | POA: Diagnosis present

## 2022-12-24 DIAGNOSIS — J449 Chronic obstructive pulmonary disease, unspecified: Secondary | ICD-10-CM | POA: Diagnosis present

## 2022-12-24 DIAGNOSIS — F411 Generalized anxiety disorder: Secondary | ICD-10-CM | POA: Diagnosis present

## 2022-12-24 DIAGNOSIS — I130 Hypertensive heart and chronic kidney disease with heart failure and stage 1 through stage 4 chronic kidney disease, or unspecified chronic kidney disease: Secondary | ICD-10-CM | POA: Diagnosis present

## 2022-12-24 DIAGNOSIS — I214 Non-ST elevation (NSTEMI) myocardial infarction: Secondary | ICD-10-CM

## 2022-12-24 DIAGNOSIS — Z716 Tobacco abuse counseling: Secondary | ICD-10-CM

## 2022-12-24 DIAGNOSIS — R630 Anorexia: Secondary | ICD-10-CM | POA: Diagnosis present

## 2022-12-24 DIAGNOSIS — I2510 Atherosclerotic heart disease of native coronary artery without angina pectoris: Secondary | ICD-10-CM | POA: Diagnosis present

## 2022-12-24 DIAGNOSIS — N1832 Chronic kidney disease, stage 3b: Secondary | ICD-10-CM | POA: Diagnosis present

## 2022-12-24 DIAGNOSIS — I701 Atherosclerosis of renal artery: Secondary | ICD-10-CM | POA: Diagnosis present

## 2022-12-24 DIAGNOSIS — E039 Hypothyroidism, unspecified: Secondary | ICD-10-CM | POA: Diagnosis present

## 2022-12-24 DIAGNOSIS — Z6828 Body mass index (BMI) 28.0-28.9, adult: Secondary | ICD-10-CM

## 2022-12-24 DIAGNOSIS — M109 Gout, unspecified: Secondary | ICD-10-CM | POA: Diagnosis present

## 2022-12-24 DIAGNOSIS — N184 Chronic kidney disease, stage 4 (severe): Secondary | ICD-10-CM | POA: Diagnosis present

## 2022-12-24 DIAGNOSIS — Z635 Disruption of family by separation and divorce: Secondary | ICD-10-CM

## 2022-12-24 DIAGNOSIS — E875 Hyperkalemia: Secondary | ICD-10-CM | POA: Diagnosis not present

## 2022-12-24 DIAGNOSIS — R7989 Other specified abnormal findings of blood chemistry: Secondary | ICD-10-CM

## 2022-12-24 DIAGNOSIS — R0789 Other chest pain: Secondary | ICD-10-CM | POA: Diagnosis not present

## 2022-12-24 DIAGNOSIS — I252 Old myocardial infarction: Secondary | ICD-10-CM

## 2022-12-24 DIAGNOSIS — I3139 Other pericardial effusion (noninflammatory): Secondary | ICD-10-CM | POA: Diagnosis not present

## 2022-12-24 DIAGNOSIS — Z833 Family history of diabetes mellitus: Secondary | ICD-10-CM

## 2022-12-24 DIAGNOSIS — J9811 Atelectasis: Secondary | ICD-10-CM | POA: Diagnosis not present

## 2022-12-24 DIAGNOSIS — E785 Hyperlipidemia, unspecified: Secondary | ICD-10-CM | POA: Diagnosis present

## 2022-12-24 DIAGNOSIS — R9431 Abnormal electrocardiogram [ECG] [EKG]: Secondary | ICD-10-CM

## 2022-12-24 DIAGNOSIS — I25118 Atherosclerotic heart disease of native coronary artery with other forms of angina pectoris: Secondary | ICD-10-CM | POA: Diagnosis not present

## 2022-12-24 DIAGNOSIS — Z8349 Family history of other endocrine, nutritional and metabolic diseases: Secondary | ICD-10-CM

## 2022-12-24 DIAGNOSIS — Z83438 Family history of other disorder of lipoprotein metabolism and other lipidemia: Secondary | ICD-10-CM

## 2022-12-24 DIAGNOSIS — D631 Anemia in chronic kidney disease: Secondary | ICD-10-CM | POA: Diagnosis not present

## 2022-12-24 DIAGNOSIS — N170 Acute kidney failure with tubular necrosis: Secondary | ICD-10-CM | POA: Insufficient documentation

## 2022-12-24 DIAGNOSIS — Z85828 Personal history of other malignant neoplasm of skin: Secondary | ICD-10-CM

## 2022-12-24 DIAGNOSIS — G8929 Other chronic pain: Secondary | ICD-10-CM | POA: Diagnosis present

## 2022-12-24 DIAGNOSIS — Z955 Presence of coronary angioplasty implant and graft: Secondary | ICD-10-CM

## 2022-12-24 DIAGNOSIS — Z79890 Hormone replacement therapy (postmenopausal): Secondary | ICD-10-CM

## 2022-12-24 DIAGNOSIS — I21A1 Myocardial infarction type 2: Secondary | ICD-10-CM | POA: Diagnosis present

## 2022-12-24 DIAGNOSIS — I255 Ischemic cardiomyopathy: Secondary | ICD-10-CM | POA: Diagnosis present

## 2022-12-24 DIAGNOSIS — K852 Alcohol induced acute pancreatitis without necrosis or infection: Principal | ICD-10-CM | POA: Diagnosis present

## 2022-12-24 DIAGNOSIS — Z801 Family history of malignant neoplasm of trachea, bronchus and lung: Secondary | ICD-10-CM

## 2022-12-24 DIAGNOSIS — Z7141 Alcohol abuse counseling and surveillance of alcoholic: Secondary | ICD-10-CM

## 2022-12-24 DIAGNOSIS — T502X5A Adverse effect of carbonic-anhydrase inhibitors, benzothiadiazides and other diuretics, initial encounter: Secondary | ICD-10-CM | POA: Diagnosis present

## 2022-12-24 DIAGNOSIS — R079 Chest pain, unspecified: Secondary | ICD-10-CM | POA: Diagnosis not present

## 2022-12-24 DIAGNOSIS — Z7982 Long term (current) use of aspirin: Secondary | ICD-10-CM

## 2022-12-24 DIAGNOSIS — Z90710 Acquired absence of both cervix and uterus: Secondary | ICD-10-CM

## 2022-12-24 DIAGNOSIS — Z743 Need for continuous supervision: Secondary | ICD-10-CM | POA: Diagnosis not present

## 2022-12-24 DIAGNOSIS — Z7902 Long term (current) use of antithrombotics/antiplatelets: Secondary | ICD-10-CM

## 2022-12-24 DIAGNOSIS — F1010 Alcohol abuse, uncomplicated: Secondary | ICD-10-CM | POA: Diagnosis present

## 2022-12-24 DIAGNOSIS — R1111 Vomiting without nausea: Secondary | ICD-10-CM | POA: Diagnosis not present

## 2022-12-24 HISTORY — DX: Acute kidney failure with tubular necrosis: N17.0

## 2022-12-24 HISTORY — DX: Non-ST elevation (NSTEMI) myocardial infarction: I21.4

## 2022-12-24 LAB — COMPREHENSIVE METABOLIC PANEL
ALT: 30 U/L (ref 0–44)
AST: 37 U/L (ref 15–41)
Albumin: 4.4 g/dL (ref 3.5–5.0)
Alkaline Phosphatase: 100 U/L (ref 38–126)
Anion gap: 16 — ABNORMAL HIGH (ref 5–15)
BUN: 57 mg/dL — ABNORMAL HIGH (ref 6–20)
CO2: 22 mmol/L (ref 22–32)
Calcium: 9.6 mg/dL (ref 8.9–10.3)
Chloride: 102 mmol/L (ref 98–111)
Creatinine, Ser: 3.63 mg/dL — ABNORMAL HIGH (ref 0.44–1.00)
GFR, Estimated: 14 mL/min — ABNORMAL LOW (ref >60)
Glucose, Bld: 103 mg/dL — ABNORMAL HIGH (ref 70–99)
Potassium: 4.3 mmol/L (ref 3.5–5.1)
Sodium: 140 mmol/L (ref 135–145)
Total Bilirubin: 1.4 mg/dL — ABNORMAL HIGH (ref 0.3–1.2)
Total Protein: 7.2 g/dL (ref 6.5–8.1)

## 2022-12-24 LAB — APTT: aPTT: 156 s — ABNORMAL HIGH (ref 24–36)

## 2022-12-24 LAB — CBC
HCT: 40.6 % (ref 36.0–46.0)
Hemoglobin: 12.8 g/dL (ref 12.0–15.0)
MCH: 31.6 pg (ref 26.0–34.0)
MCHC: 31.5 g/dL (ref 30.0–36.0)
MCV: 100.2 fL — ABNORMAL HIGH (ref 80.0–100.0)
Platelets: 218 10*3/uL (ref 150–400)
RBC: 4.05 MIL/uL (ref 3.87–5.11)
RDW: 15.8 % — ABNORMAL HIGH (ref 11.5–15.5)
WBC: 13 10*3/uL — ABNORMAL HIGH (ref 4.0–10.5)
nRBC: 0.2 % (ref 0.0–0.2)

## 2022-12-24 LAB — TROPONIN I (HIGH SENSITIVITY)
Troponin I (High Sensitivity): 275 ng/L (ref <18)
Troponin I (High Sensitivity): 270 ng/L (ref <18)

## 2022-12-24 LAB — PROTIME-INR
INR: 1.4 — ABNORMAL HIGH (ref 0.8–1.2)
Prothrombin Time: 17.5 s — ABNORMAL HIGH (ref 11.4–15.2)

## 2022-12-24 LAB — LIPASE, BLOOD: Lipase: 61 U/L — ABNORMAL HIGH (ref 11–51)

## 2022-12-24 LAB — HEPARIN LEVEL (UNFRACTIONATED): Heparin Unfractionated: 0.32 [IU]/mL (ref 0.30–0.70)

## 2022-12-24 LAB — MAGNESIUM: Magnesium: 2.3 mg/dL (ref 1.7–2.4)

## 2022-12-24 MED ORDER — BUSPIRONE HCL 10 MG PO TABS
5.0000 mg | ORAL_TABLET | Freq: Three times a day (TID) | ORAL | Status: DC | PRN
  Administered 2022-12-26 – 2022-12-27 (×3): 5 mg via ORAL
  Filled 2022-12-24 (×3): qty 1

## 2022-12-24 MED ORDER — ONDANSETRON HCL 4 MG PO TABS
4.0000 mg | ORAL_TABLET | Freq: Four times a day (QID) | ORAL | Status: DC | PRN
  Administered 2022-12-27 – 2022-12-28 (×4): 4 mg via ORAL
  Filled 2022-12-24 (×4): qty 1

## 2022-12-24 MED ORDER — SODIUM CHLORIDE 0.9 % IV SOLN: INTRAVENOUS | Status: AC

## 2022-12-24 MED ORDER — PAROXETINE HCL 20 MG PO TABS
40.0000 mg | ORAL_TABLET | Freq: Every day | ORAL | Status: DC
  Administered 2022-12-24 – 2022-12-29 (×6): 40 mg via ORAL
  Filled 2022-12-24 (×6): qty 2

## 2022-12-24 MED ORDER — ACETAMINOPHEN 500 MG PO TABS
1000.0000 mg | ORAL_TABLET | Freq: Four times a day (QID) | ORAL | Status: DC | PRN
  Administered 2022-12-25 – 2022-12-29 (×6): 1000 mg via ORAL
  Filled 2022-12-24 (×6): qty 2

## 2022-12-24 MED ORDER — ATORVASTATIN CALCIUM 20 MG PO TABS
80.0000 mg | ORAL_TABLET | Freq: Every day | ORAL | Status: DC
  Administered 2022-12-24 – 2022-12-28 (×5): 80 mg via ORAL
  Filled 2022-12-24 (×2): qty 1
  Filled 2022-12-24: qty 4
  Filled 2022-12-24 (×2): qty 1

## 2022-12-24 MED ORDER — FAMOTIDINE 20 MG PO TABS
20.0000 mg | ORAL_TABLET | Freq: Two times a day (BID) | ORAL | Status: DC | PRN
  Administered 2022-12-25 – 2022-12-29 (×4): 20 mg via ORAL
  Filled 2022-12-24 (×4): qty 1

## 2022-12-24 MED ORDER — LEVOTHYROXINE SODIUM 50 MCG PO TABS
125.0000 ug | ORAL_TABLET | Freq: Every day | ORAL | Status: DC
  Administered 2022-12-25 – 2022-12-29 (×5): 125 ug via ORAL
  Filled 2022-12-24 (×5): qty 1

## 2022-12-24 MED ORDER — INFLUENZA VIRUS VACC SPLIT PF (FLUZONE) 0.5 ML IM SUSY
0.5000 mL | PREFILLED_SYRINGE | INTRAMUSCULAR | Status: DC
  Filled 2022-12-24: qty 0.5

## 2022-12-24 MED ORDER — ONDANSETRON HCL 4 MG/2ML IJ SOLN
4.0000 mg | Freq: Four times a day (QID) | INTRAMUSCULAR | Status: DC | PRN
  Administered 2022-12-24 – 2022-12-29 (×6): 4 mg via INTRAVENOUS
  Filled 2022-12-24 (×6): qty 2

## 2022-12-24 MED ORDER — NITROGLYCERIN 0.4 MG SL SUBL
0.4000 mg | SUBLINGUAL_TABLET | SUBLINGUAL | Status: DC | PRN
  Administered 2022-12-24 (×2): 0.4 mg via SUBLINGUAL
  Filled 2022-12-24 (×2): qty 1

## 2022-12-24 MED ORDER — HEPARIN (PORCINE) 25000 UT/250ML-% IV SOLN
1200.0000 [IU]/h | INTRAVENOUS | Status: DC
  Administered 2022-12-24: 950 [IU]/h via INTRAVENOUS
  Administered 2022-12-26: 1200 [IU]/h via INTRAVENOUS
  Filled 2022-12-24 (×4): qty 250

## 2022-12-24 MED ORDER — SODIUM CHLORIDE 0.9 % IV BOLUS
1000.0000 mL | Freq: Once | INTRAVENOUS | Status: AC
  Administered 2022-12-24: 1000 mL via INTRAVENOUS

## 2022-12-24 MED ORDER — MOMETASONE FURO-FORMOTEROL FUM 200-5 MCG/ACT IN AERO
2.0000 | INHALATION_SPRAY | Freq: Two times a day (BID) | RESPIRATORY_TRACT | Status: DC
  Administered 2022-12-25 – 2022-12-29 (×7): 2 via RESPIRATORY_TRACT
  Filled 2022-12-24 (×2): qty 8.8

## 2022-12-24 MED ORDER — PANTOPRAZOLE SODIUM 40 MG PO TBEC: 40.0000 mg | DELAYED_RELEASE_TABLET | Freq: Two times a day (BID) | ORAL | Status: DC

## 2022-12-24 MED ORDER — CLOPIDOGREL BISULFATE 75 MG PO TABS
75.0000 mg | ORAL_TABLET | Freq: Every day | ORAL | Status: DC
  Administered 2022-12-24 – 2022-12-29 (×6): 75 mg via ORAL
  Filled 2022-12-24 (×6): qty 1

## 2022-12-24 MED ORDER — ENOXAPARIN SODIUM 40 MG/0.4ML IJ SOSY: 40.0000 mg | PREFILLED_SYRINGE | INTRAMUSCULAR | Status: DC

## 2022-12-24 MED ORDER — THIAMINE MONONITRATE 100 MG PO TABS
100.0000 mg | ORAL_TABLET | Freq: Every day | ORAL | Status: DC
  Administered 2022-12-24 – 2022-12-29 (×6): 100 mg via ORAL
  Filled 2022-12-24 (×6): qty 1

## 2022-12-24 MED ORDER — LABETALOL HCL 5 MG/ML IV SOLN
10.0000 mg | INTRAVENOUS | Status: DC | PRN
  Administered 2022-12-27 – 2022-12-29 (×2): 10 mg via INTRAVENOUS
  Filled 2022-12-24 (×2): qty 4

## 2022-12-24 MED ORDER — FOLIC ACID 1 MG PO TABS
1.0000 mg | ORAL_TABLET | Freq: Every day | ORAL | Status: DC
  Administered 2022-12-24 – 2022-12-29 (×6): 1 mg via ORAL
  Filled 2022-12-24 (×6): qty 1

## 2022-12-24 MED ORDER — HYDROMORPHONE HCL 1 MG/ML IJ SOLN
0.5000 mg | INTRAMUSCULAR | Status: DC | PRN
  Administered 2022-12-24: 1 mg via INTRAVENOUS
  Administered 2022-12-24: 0.5 mg via INTRAVENOUS
  Administered 2022-12-24 – 2022-12-25 (×2): 1 mg via INTRAVENOUS
  Filled 2022-12-24 (×4): qty 1

## 2022-12-24 MED ORDER — ASPIRIN 81 MG PO TBEC
81.0000 mg | DELAYED_RELEASE_TABLET | Freq: Every day | ORAL | Status: DC
  Administered 2022-12-24 – 2022-12-29 (×6): 81 mg via ORAL
  Filled 2022-12-24 (×6): qty 1

## 2022-12-24 MED ORDER — NICOTINE 7 MG/24HR TD PT24
7.0000 mg | MEDICATED_PATCH | Freq: Every day | TRANSDERMAL | Status: DC
  Administered 2022-12-24 – 2022-12-29 (×6): 7 mg via TRANSDERMAL
  Filled 2022-12-24 (×6): qty 1

## 2022-12-24 MED ORDER — CARVEDILOL 6.25 MG PO TABS: 12.5000 mg | ORAL_TABLET | Freq: Two times a day (BID) | ORAL | Status: DC

## 2022-12-24 MED ORDER — THIAMINE HCL 100 MG/ML IJ SOLN: 100.0000 mg | Freq: Every day | INTRAMUSCULAR | Status: DC

## 2022-12-24 MED ORDER — GABAPENTIN 300 MG PO CAPS
300.0000 mg | ORAL_CAPSULE | Freq: Two times a day (BID) | ORAL | Status: DC
  Administered 2022-12-24 – 2022-12-29 (×10): 300 mg via ORAL
  Filled 2022-12-24 (×10): qty 1

## 2022-12-24 MED ORDER — LORAZEPAM 2 MG/ML IJ SOLN
1.0000 mg | INTRAMUSCULAR | Status: DC | PRN
  Administered 2022-12-25: 2 mg via INTRAVENOUS
  Filled 2022-12-24: qty 1

## 2022-12-24 MED ORDER — ADULT MULTIVITAMIN W/MINERALS CH
1.0000 | ORAL_TABLET | Freq: Every day | ORAL | Status: DC
  Administered 2022-12-24 – 2022-12-29 (×6): 1 via ORAL
  Filled 2022-12-24 (×6): qty 1

## 2022-12-24 MED ORDER — LORATADINE 10 MG PO TABS
10.0000 mg | ORAL_TABLET | Freq: Every day | ORAL | Status: DC
  Administered 2022-12-24 – 2022-12-29 (×6): 10 mg via ORAL
  Filled 2022-12-24 (×6): qty 1

## 2022-12-24 MED ORDER — LORAZEPAM 1 MG PO TABS: 1.0000 mg | ORAL_TABLET | ORAL | Status: DC | PRN

## 2022-12-24 MED ORDER — HEPARIN BOLUS VIA INFUSION
4000.0000 [IU] | Freq: Once | INTRAVENOUS | Status: AC
  Administered 2022-12-24: 4000 [IU] via INTRAVENOUS
  Filled 2022-12-24: qty 4000

## 2022-12-24 MED ORDER — CARVEDILOL 25 MG PO TABS
25.0000 mg | ORAL_TABLET | Freq: Two times a day (BID) | ORAL | Status: DC
  Administered 2022-12-24: 25 mg via ORAL
  Filled 2022-12-24: qty 4
  Filled 2022-12-24: qty 1

## 2022-12-24 MED ORDER — LORAZEPAM 2 MG/ML IJ SOLN
1.0000 mg | Freq: Once | INTRAMUSCULAR | Status: AC
  Administered 2022-12-24: 1 mg via INTRAVENOUS
  Filled 2022-12-24: qty 1

## 2022-12-24 NOTE — ED Notes (Signed)
Pt reports epigastric pain for the past 3 days, pt states that she thinks its her pancreas, states that she had an episode 3 months ago. Pt arrives via ems, pt states that she vomited this am and has been doing so for the past 4 days, reports last bm was this am and states that she hasn't been able to eat or drink for the past 4 days

## 2022-12-24 NOTE — H&P (Signed)
History and Physical    Angelica Herrera WUJ:[id number] DOB: [birth year] DOA: 12/24/2022  PCP: Danelle Berry, PA-C (Confirm with patient/family/NH records and if not entered, this has to be entered at [facility] point of entry) Patient coming from: Home  I have personally briefly reviewed patient's old medical records in [facility]  Chief Complaint: Abdominal pain, nauseous vomiting and diarrhea  HPI: Angelica Herrera is a 52 y.o. female with medical history significant of alcoholic pancreatitis, CAD stenting, chronic HFrEF with LVEF 35-40%, renal artery stenosis status post stenting on left side, HTN, CKD stage IIIb, GERD, anxiety/depression, presented with worsening of abdominal pain, nauseous vomiting and diarrhea.  Patient has history of alcoholic pancreatitis but she continues to drink alcohol 3-4 times a week.  This time, her symptoms started 3 days ago with new onset of epigastric pain radiating to right RUQ, cramping-like intermittent, worsening with eating meals nonradiating, "I thought the pancreatitis has come back" associated with chills but no fever, same time she also developed frequent nauseous vomiting after eating meals and loose diarrhea.  She feels that overall her oral intake including solid food and liquids is decreased and her urine output also decreased as compared to her baseline.  Her PCP prescribed her with 2  "stomach medicine ", despite taking them she reports no improvement of any of her symptoms.  But denied any flank pain or back pain.  Denied any tenesmus.  She reported improvement of diarrhea overnight but continued to feel nauseous and vomiting when trying to eat.  Patient also reported that the abdominal pain she experienced a similar to the pain when she had her first pancreatitis but unlike the pain she experienced when she had MI.  ED Course: Afebrile, blood pressure elevated, nonhypoxic.  Lipase 65, AST 37 ALT 30 bilirubin 1.4.  Bili BC 13, creatinine 3.6  compared to creatinine 2.6 about 1 month ago.  Troponin 300, EKG showed T wave inversion in V4-V6.  Patient was given IV bolus and started on heparin drip in the ED.  Cardiology was consulted.  Review of Systems: As per HPI otherwise 14 point review of systems negative.    Past Medical History:  Diagnosis Date   Accelerated hypertension 11/13/2018   Allergic rhinitis due to pollen 06/28/2015   Anxiety with depression 02/18/2020   Arthritis    gout   Chest pain 11/12/2018   Chronic combined systolic (congestive) and diastolic (congestive) heart failure (HCC)    a. 02/2017 Echo: EF 30-35%, diff HK, sev HK/poss AK of ant and lat walls. Gr1 DD. Mild to mod MR. Nl RV fxn. Mod TR.   Chronic obstructive pulmonary disease (HCC) 09/02/2019   tx with trelegy and SABA   Chronic systolic heart failure (HCC) 11/21/2017   CKD (chronic kidney disease), stage IV (HCC)    Controlled substance agreement signed 09/07/2015   Coronary artery disease involving native coronary artery    Degenerative disc disease, cervical 03/17/2015   Depression, recurrent (HCC) 12/02/2017   Essential hypertension, malignant 02/12/2012   GAD (generalized anxiety disorder) 03/17/2015   History of acute anterior wall MI 02/12/2012   Hyperlipidemia    Hypothyroidism    Ischemic cardiomyopathy    a. 02/2012 Echo: EF 40-45%, ant/lat HK, Gr1 DD, Mild MR, PASP ; b. 02/2017 Echo: EF 30-35%.   NSVT (nonsustained ventricular tachycardia) (HCC)    a. 02/2012 post-MI   Obesity    Pancreatitis    Rhinosinusitis 09/02/2019   S/P primary angioplasty with coronary stent  03/04/2018   Dual antiplatelet therapy INDEFINITELY per cardiology note Nov 2019   Squamous cell carcinoma of back 04/20/2019   referrred to sugery for f/up excision, derm not available for several month, lesion with rapid change cannot wait, still see derm for skin survey etc   Stage 3b chronic kidney disease (HCC) 02/18/2020   Tobacco abuse    Tobacco dependence  02/18/2020    Past Surgical History:  Procedure Laterality Date   ABDOMINAL HYSTERECTOMY     BIOPSY  10/30/2022   Procedure: BIOPSY;  Surgeon: Wyline Mood, MD;  Location: [facility] ENDOSCOPY;  Service: Gastroenterology;;   CARDIAC CATHETERIZATION  2013   [facility] s/p stent   CARDIAC CATHETERIZATION Left 08/03/2015   Procedure: Left Heart Cath and Coronary Angiography;  Surgeon: Iran Ouch, MD;  Location: [facility] INVASIVE CV LAB;  Service: Cardiovascular;  Laterality: Left;   COLONOSCOPY WITH PROPOFOL N/A 10/30/2022   Procedure: COLONOSCOPY WITH PROPOFOL;  Surgeon: Wyline Mood, MD;  Location: [facility] ENDOSCOPY;  Service: Gastroenterology;  Laterality: N/A;   DILATION AND CURETTAGE OF UTERUS     ESOPHAGOGASTRODUODENOSCOPY (EGD) WITH PROPOFOL N/A 10/30/2022   Procedure: ESOPHAGOGASTRODUODENOSCOPY (EGD) WITH PROPOFOL;  Surgeon: Wyline Mood, MD;  Location: [facility] ENDOSCOPY;  Service: Gastroenterology;  Laterality: N/A;   LEFT HEART CATHETERIZATION WITH CORONARY ANGIOGRAM N/A 02/11/2012   Procedure: LEFT HEART CATHETERIZATION WITH CORONARY ANGIOGRAM;  Surgeon: Tonny Bollman, MD;  Location: [facility] CATH LAB;  Service: Cardiovascular;  Laterality: N/A;   POLYPECTOMY  10/30/2022   Procedure: POLYPECTOMY;  Surgeon: Wyline Mood, MD;  Location: [facility] ENDOSCOPY;  Service: Gastroenterology;;   RENAL INTERVENTION N/A 11/17/2018   Procedure: RENAL INTERVENTION;  Surgeon: Renford Dills, MD;  Location: [facility] INVASIVE CV LAB;  Service: Cardiovascular;  Laterality: N/A;     reports that she has been smoking cigarettes. She has a 7.5 pack-year smoking history. She has never used smokeless tobacco. She reports that she does not currently use alcohol. She reports that she does not use drugs.  No Known Allergies  Family History  Problem Relation Age of Onset   Hypertension Father    Thyroid disease Father    AAA (abdominal aortic aneurysm) Father    Hypertension Mother    Hyperlipidemia Mother    Diabetes Mother    Kidney  disease Mother    Hypertension Sister    Hypertension Sister    Supraventricular tachycardia Sister    Hypertension Brother    Hypertension Brother    Hypertension Daughter    Cancer Paternal Grandfather        lung     Prior to Admission medications   Medication Sig Start Date End Date Taking? Authorizing Provider  acetaminophen (TYLENOL) 500 MG tablet Take 1,000 mg by mouth every 6 (six) hours as needed.    [provider]  aspirin 81 MG tablet Take 1 tablet (81 mg total) by mouth daily. 02/15/12   Creig Hines, NP  atorvastatin (LIPITOR) 80 MG tablet Take 1 tablet (80 mg total) by mouth at bedtime. 07/30/22   Danelle Berry, PA-C  busPIRone (BUSPAR) 5 MG tablet TAKE 1 TO 3 TABLETS BY MOUTH TWICE DAILY AS NEEDED 12/12/22   Danelle Berry, PA-C  carvedilol (COREG) 25 MG tablet TAKE (1) TABLET BY MOUTH TWICE DAILY Patient taking differently: Take 12.5 mg by mouth 2 (two) times daily with a meal. 09/30/22   Dunn, Raymon Mutton, PA-C  cetirizine (ZYRTEC) 10 MG tablet TAKE (1) TABLET BY MOUTH  AT BEDTIME 11/15/22   Angelica Chessman,  Leisa, PA-C  clopidogrel (PLAVIX) 75 MG tablet TAKE (1) TABLET BY MOUTH EVERY DAY 07/30/22   Danelle Berry, PA-C  colchicine 0.6 MG tablet Take 1 tablet (0.6 mg total) by mouth daily as needed (gout). 06/01/21   Margarita Mail, DO  famotidine (PEPCID) 20 MG tablet Take 1 tablet (20 mg total) by mouth 2 (two) times daily as needed for heartburn or indigestion. 05/28/22   Danelle Berry, PA-C  gabapentin (NEURONTIN) 300 MG capsule Take 1 capsule (300 mg total) by mouth 2 (two) times daily. 07/30/22   Danelle Berry, PA-C  levothyroxine (SYNTHROID) 125 MCG tablet TAKE (1) TABLET BY MOUTH EVERY MORNING BEFORE BREAKFAST 07/04/22   Danelle Berry, PA-C  Na Sulfate-K Sulfate-Mg Sulf 17.5-3.13-1.6 GM/177ML SOLN Take by mouth. 09/17/22   [provider]  nicotine (NICODERM CQ - DOSED IN MG/24 HR) 7 mg/24hr patch Place 1 patch (7 mg total) onto the skin daily. 11/15/22   Dunn, Raymon Mutton, PA-C  nitroGLYCERIN (NITROSTAT) 0.4 MG SL tablet Place 1 tablet (0.4 mg total) under the tongue every 5 (five) minutes x 3 doses as needed for chest pain. 08/25/19   Alver Sorrow, NP  ondansetron (ZOFRAN-ODT) 4 MG disintegrating tablet Take 1 tablet (4 mg total) by mouth every 8 (eight) hours as needed for nausea or vomiting. 07/30/22   Danelle Berry, PA-C  pantoprazole (PROTONIX) 40 MG tablet Take 1 tablet (40 mg total) by mouth 2 (two) times daily. Empty stomach - take more than 4 hours after taking levothyroxine 07/30/22   Danelle Berry, PA-C  PARoxetine (PAXIL) 40 MG tablet TAKE (1) TABLET BY MOUTH EVERY DAY 10/10/22   Danelle Berry, PA-C  potassium chloride SA (KLOR-CON M) 20 MEQ tablet Take 1 tablet (20 mEq total) by mouth daily. 07/04/22 06/29/23  Iran Ouch, MD  sacubitril-valsartan (ENTRESTO) 97-103 MG Take 1 tablet by mouth 2 (two) times daily. 07/04/22   Iran Ouch, MD  torsemide (DEMADEX) 20 MG tablet Take 1 tablet (20 mg total) by mouth daily. 11/18/22 02/16/23  Creig Hines, NP    Physical Exam: Vitals:   12/24/22 1000 12/24/22 1052 12/24/22 1244 12/24/22 1246  BP:  (!) 151/98 (!) 161/100 (!) 174/119  Pulse:  81 92 88  Resp:  16 16   Temp:  98 F (36.7 C)    TempSrc:  Oral    SpO2:  100% 99% 98%  Weight: 81.2 kg     Height: 5\' 7"  (1.702 m)       Constitutional: NAD, calm, comfortable Vitals:   12/24/22 1000 12/24/22 1052 12/24/22 1244 12/24/22 1246  BP:  (!) 151/98 (!) 161/100 (!) 174/119  Pulse:  81 92 88  Resp:  16 16   Temp:  98 F (36.7 C)    TempSrc:  Oral    SpO2:  100% 99% 98%  Weight: 81.2 kg     Height: 5\' 7"  (1.702 m)      Eyes: PERRL, lids and conjunctivae normal ENMT: Mucous membranes are dry. Posterior pharynx clear of any exudate or lesions.Normal dentition.  Neck: normal, supple, no masses, no thyromegaly Respiratory: clear to auscultation bilaterally, no wheezing, no crackles. Normal respiratory effort. No accessory muscle  use.  Cardiovascular: Regular rate and rhythm, no murmurs / rubs / gallops. No extremity edema. 2+ pedal pulses. No carotid bruits.  Abdomen: tenderness RUQ/epigastric area, no rebound no guarding, no masses palpated. No hepatosplenomegaly. Bowel sounds positive.  Musculoskeletal: no clubbing / cyanosis. No joint deformity upper and  lower extremities. Good ROM, no contractures. Normal muscle tone.  Skin: no rashes, lesions, ulcers. No induration Neurologic: CN 2-12 grossly intact. Sensation intact, DTR normal. Strength 5/5 in all 4.  Psychiatric: Normal judgment and insight. Alert and oriented x 3. Normal mood.     Labs on Admission: I have personally reviewed following labs and imaging studies  CBC: Recent Labs  Lab 12/24/22 1002  WBC 13.0*  HGB 12.8  HCT 40.6  MCV 100.2*  PLT 218   Basic Metabolic Panel: Recent Labs  Lab 12/24/22 1002  NA 140  K 4.3  CL 102  CO2 22  GLUCOSE 103*  BUN 57*  CREATININE 3.63*  CALCIUM 9.6  MG 2.3   GFR: Estimated Creatinine Clearance: 19.6 mL/min (A) (by C-G formula based on SCr of 3.63 mg/dL (H)). Liver Function Tests: Recent Labs  Lab 12/24/22 1002  AST 37  ALT 30  ALKPHOS 100  BILITOT 1.4*  PROT 7.2  ALBUMIN 4.4   Recent Labs  Lab 12/24/22 1002  LIPASE 61*   No results for input(s): "AMMONIA" in the last 168 hours. Coagulation Profile: No results for input(s): "INR", "PROTIME" in the last 168 hours. Cardiac Enzymes: No results for input(s): "CKTOTAL", "CKMB", "CKMBINDEX", "TROPONINI" in the last 168 hours. BNP (last 3 results) No results for input(s): "PROBNP" in the last 8760 hours. HbA1C: No results for input(s): "HGBA1C" in the last 72 hours. CBG: No results for input(s): "GLUCAP" in the last 168 hours. Lipid Profile: No results for input(s): "CHOL", "HDL", "LDLCALC", "TRIG", "CHOLHDL", "LDLDIRECT" in the last 72 hours. Thyroid Function Tests: No results for input(s): "TSH", "T4TOTAL", "FREET4", "T3FREE",  "THYROIDAB" in the last 72 hours. Anemia Panel: No results for input(s): "VITAMINB12", "FOLATE", "FERRITIN", "TIBC", "IRON", "RETICCTPCT" in the last 72 hours. Urine analysis:    Component Value Date/Time   COLORURINE YELLOW 03/17/2020 1042   APPEARANCEUR CLEAR 03/17/2020 1042   LABSPEC 1.020 03/17/2020 1042   PHURINE 6.0 03/17/2020 1042   GLUCOSEU NEGATIVE 03/17/2020 1042   HGBUR NEGATIVE 03/17/2020 1042   BILIRUBINUR Negative 05/31/2022 1115   KETONESUR NEGATIVE 03/17/2020 1042   PROTEINUR Positive (A) 05/31/2022 1115   PROTEINUR 1+ (A) 03/17/2020 1042   UROBILINOGEN 0.2 05/31/2022 1115   UROBILINOGEN 0.2 02/12/2012 1541   NITRITE Positive 05/31/2022 1115   NITRITE NEGATIVE 03/17/2020 1042   LEUKOCYTESUR Moderate (2+) (A) 05/31/2022 1115   LEUKOCYTESUR 1+ (A) 03/17/2020 1042    Radiological Exams on Admission: US ABDOMEN LIMITED RUQ (LIVER/GB)  Result Date: 12/24/2022 CLINICAL DATA:  Abdominal pain EXAM: ULTRASOUND ABDOMEN LIMITED RIGHT UPPER QUADRANT COMPARISON:  None Available. FINDINGS: Gallbladder: No gallstones or wall thickening visualized. No sonographic Murphy sign noted by sonographer. Common bile duct: Diameter: 0.3 cm Liver: No focal lesion identified. Within normal limits in parenchymal echogenicity. Portal vein is patent on color Doppler imaging with normal direction of blood flow towards the liver. Other: None. IMPRESSION: 1. Normal sonographic appearance of the liver and gallbladder. Electronically Signed   By: Gaylyn Rong M.D.   On: 12/24/2022 13:19   DG Chest Port 1 View  Result Date: 12/24/2022 CLINICAL DATA:  Chest pain and cough.  Vomiting for 3 days EXAM: PORTABLE CHEST 1 VIEW COMPARISON:  02/25/2019 FINDINGS: Moderate cardiomegaly. Upper zone pulmonary vascular prominence favoring pulmonary venous hypertension. Bandlike atelectasis in the left mid lung. No blunting of the costophrenic angles. No significant bony abnormality identified. IMPRESSION: 1.  Moderate cardiomegaly with pulmonary venous hypertension. 2. Bandlike atelectasis in the left mid lung.  Electronically Signed   By: Gaylyn Rong M.D.   On: 12/24/2022 13:17    EKG: Independently reviewed.  Sinus with T wave inversions on V4-V6 on both initial and repeated EKG in the ED  Assessment/Plan Principal Problem:   NSTEMI (non-ST elevated myocardial infarction) (HCC) Active Problems:   Alcoholic pancreatitis   AKI (acute kidney injury) (HCC)  (please populate well all problems here in Problem List. (For example, if patient is on BP meds at home and you resume or decide to hold them, it is a problem that needs to be her. Same for CAD, COPD, HLD and so on)  NSTEMI -No significant chest pains however with new T wave inversions shown on V4-V6 compared to her previous EKG as well as elevation of troponins raised the concern about NSTEMI. -On ACS medications including aspirin Plavix and heparin drip -Continue beta-blocker, ACEI on hold due to AKI. -Echocardiogram -Recheck troponin level tomorrow, recheck EKG in AM -Other DDx, elevation of troponin 275> 270 implying demanding ischemia however given significant dynamic T wave changes, agreed with NSTEMI coverage with systemic anticoagulation for now. -Cardiology consultation to follow  Acute pancreatitis -With elevation of lipase and location of her abdominal pain overlapping with pancreatitis symptoms she had before.  RUQ ultrasound showed no significant hepatic biliary abnormalities and CT abdominal pelvis pending -Trend lipase -Management including IV fluid, pain medications -Liquid diet, may advance if no worsening of symptoms  AKI on CKD stage IIIb -Clinically patient appears to be volume contracted due to repeated nauseous vomiting diarrhea -Plan to hold off diuresis Lasix and Entresto -Short course of IV fluid, and recheck kidney function tomorrow -Renal ultrasound  Chronic HFrEF HTN, uncontrolled -Volume contracted  with AKI -Diuretics and Entresto on hold -Will initiate as needed labetalol for BP 150/100  CAD -As above  Hypothyroidism -Continue Synthroid  COPD -No acute concern  Alcohol abuse -No symptoms signs of active withdrawal, start CIWA protocol with as needed benzos   DVT prophylaxis: Heparin drip Code Status: Full code Family Communication: None at bedside Disposition Plan: Patient is sick with NSTEMI requiring systemic anticoagulation and inpatient cardiology consultation, expect more than 2 midnight hospital stay Consults called: Cardiology  Admission status: Telemetry admission   Emeline General MD [facility] Pager [phone]  12/24/2022, 1:53 PM

## 2022-12-24 NOTE — Progress Notes (Signed)
ANTICOAGULATION CONSULT NOTE - Initial Consult  Pharmacy Consult for Heparin Infusion Indication: chest pain/ACS  No Known Allergies  Patient Measurements: Height: 5\' 7"  (170.2 cm) Weight: 81.2 kg (179 lb 0.2 oz) IBW/kg (Calculated) : 61.6 Heparin Dosing Weight: 78.3 kg  Vital Signs: Temp: 98.6 F (37 C) (09/10 1928) Temp Source: Oral (09/10 1928) BP: 113/74 (09/10 1928) Pulse Rate: 76 (09/10 1928)  Labs: Recent Labs    12/24/22 1002 12/24/22 1229 12/24/22 1636 12/24/22 2208  HGB 12.8  --   --   --   HCT 40.6  --   --   --   PLT 218  --   --   --   APTT  --   --  156*  --   LABPROT  --   --  17.5*  --   INR  --   --  1.4*  --   HEPARINUNFRC  --   --   --  0.32  CREATININE 3.63*  --   --   --   TROPONINIHS 275* 270*  --   --     Estimated Creatinine Clearance: 19.6 mL/min (A) (by C-G formula based on SCr of 3.63 mg/dL (H)).   Medical History: Past Medical History:  Diagnosis Date   Accelerated hypertension 11/13/2018   Allergic rhinitis due to pollen 06/28/2015   Anxiety with depression 02/18/2020   Arthritis    gout   Chest pain 11/12/2018   Chronic combined systolic (congestive) and diastolic (congestive) heart failure (HCC)    a. 02/2017 Echo: EF 30-35%, diff HK, sev HK/poss AK of ant and lat walls. Gr1 DD. Mild to mod MR. Nl RV fxn. Mod TR.   Chronic obstructive pulmonary disease (HCC) 09/02/2019   tx with trelegy and SABA   Chronic systolic heart failure (HCC) 11/21/2017   CKD (chronic kidney disease), stage IV (HCC)    Controlled substance agreement signed 09/07/2015   Coronary artery disease involving native coronary artery    Degenerative disc disease, cervical 03/17/2015   Depression, recurrent (HCC) 12/02/2017   Essential hypertension, malignant 02/12/2012   GAD (generalized anxiety disorder) 03/17/2015   History of acute anterior wall MI 02/12/2012   Hyperlipidemia    Hypothyroidism    Ischemic cardiomyopathy    a. 02/2012 Echo: EF 40-45%, ant/lat HK,  Gr1 DD, Mild MR, PASP ; b. 02/2017 Echo: EF 30-35%.   NSVT (nonsustained ventricular tachycardia) (HCC)    a. 02/2012 post-MI   Obesity    Pancreatitis    Rhinosinusitis 09/02/2019   S/P primary angioplasty with coronary stent 03/04/2018   Dual antiplatelet therapy INDEFINITELY per cardiology note Nov 2019   Squamous cell carcinoma of back 04/20/2019   referrred to sugery for f/up excision, derm not available for several month, lesion with rapid change cannot wait, still see derm for skin survey etc   Stage 3b chronic kidney disease (HCC) 02/18/2020   Tobacco abuse    Tobacco dependence 02/18/2020    Assessment: Patient is a 52yo female presenting with epigastric pain. Troponins are elevated. Pharmacy consulted for Heparin dosing. No prior anticoagulants noted in medication review.  Goal of Therapy:  Heparin level 0.3-0.7 units/ml Monitor platelets by anticoagulation protocol: Yes   Plan:  9/10:  HL @ 2208 = 0.32, therapeutic X 1  - Will continue pt on current rate and recheck HL on 9/11 @ 0600.   Angelica Herrera D 12/24/2022 11:14 PM

## 2022-12-24 NOTE — ED Triage Notes (Signed)
Pt here via [facility] with lower substernal CP. Pt has hx of pancreatitis and hypertension and MI from 2013. Pt has been vomiting x3 days, recent alcohol use. 325 mg of aspirin taken to day along with 4 mg of zofran from ems. 22G R hand.   156/100 84 97% RA 120-cbg

## 2022-12-24 NOTE — Plan of Care (Signed)

## 2022-12-24 NOTE — ED Provider Notes (Signed)
[facility] Provider Note    Event Date/Time   First MD Initiated Contact with Patient 12/24/22 1043     (approximate)   History   Chest Pain   HPI  TENNILLE Herrera is a 52 y.o. female   Past medical history of CKD, COPD, heart failure, CAD with MI and stent placement 2013 who presents to the emergency department with upper abdominal pain and vomiting for the last 3 days, self-reports history of pancreatitis and this feels similar.  This started after she drank alcohol at a friend's party, not a daily drinker.  325 aspirin given already today and Zofran by EMS.    She has pain in the upper abdominal/epigastrium and is nonradiating.  Not associated with any respiratory symptoms and she has been without a fever.  She denies any GI bleeding.  No urinary symptoms.   Physical Exam   Triage Vital Signs: ED Triage Vitals  Encounter Vitals Group     BP 12/24/22 1052 (!) 151/98     Systolic BP Percentile --      Diastolic BP Percentile --      Pulse Rate 12/24/22 1052 81     Resp 12/24/22 1052 16     Temp 12/24/22 1052 98 F (36.7 C)     Temp Source 12/24/22 1052 Oral     SpO2 12/24/22 1052 100 %     Weight 12/24/22 1000 179 lb 0.2 oz (81.2 kg)     Height 12/24/22 1000 5\' 7"  (1.702 m)     Head Circumference --      Peak Flow --      Pain Score 12/24/22 1000 8     Pain Loc --      Pain Education --      Exclude from Growth Chart --     Most recent vital signs: Vitals:   12/24/22 1246 12/24/22 1442  BP: (!) 174/119 (!) 162/100  Pulse: 88 82  Resp:  14  Temp:  98.9 F (37.2 C)  SpO2: 98% 97%    General: Awake, no distress.  CV:  Good peripheral perfusion.  Resp:  Normal effort.  Abd:  No distention.  Other:  Uncomfortable appearing with tenderness to epigastrium and clear lungs.  Hypertensive otherwise vital signs normal, afebrile.   ED Results / Procedures / Treatments   Labs (all labs ordered are listed, but only abnormal results  are displayed) Labs Reviewed  LIPASE, BLOOD - Abnormal; Notable for the following components:      Result Value   Lipase 61 (*)    All other components within normal limits  COMPREHENSIVE METABOLIC PANEL - Abnormal; Notable for the following components:   Glucose, Bld 103 (*)    BUN 57 (*)    Creatinine, Ser 3.63 (*)    Total Bilirubin 1.4 (*)    GFR, Estimated 14 (*)    Anion gap 16 (*)    All other components within normal limits  CBC - Abnormal; Notable for the following components:   WBC 13.0 (*)    MCV 100.2 (*)    RDW 15.8 (*)    All other components within normal limits  TROPONIN I (HIGH SENSITIVITY) - Abnormal; Notable for the following components:   Troponin I (High Sensitivity) 275 (*)    All other components within normal limits  TROPONIN I (HIGH SENSITIVITY) - Abnormal; Notable for the following components:   Troponin I (High Sensitivity) 270 (*)    All other  components within normal limits  MAGNESIUM  URINALYSIS, ROUTINE W REFLEX MICROSCOPIC  APTT  PROTIME-INR  HIV ANTIBODY (ROUTINE TESTING W REFLEX)  HEPARIN LEVEL (UNFRACTIONATED)     I ordered and reviewed the above labs they are notable for she has a troponin in the 200s and a white blood cell count 13 and a lipase that is not markedly elevated nor or her LFTs markedly deranged.  EKG  ED ECG REPORT I, Pilar Jarvis, the attending physician, personally viewed and interpreted this ECG.   Date: 12/24/2022  EKG Time: 1222  Rate: 80  Rhythm: nsr  Axis: nl  Intervals: WTC prolonged  ST&T Change: TWI inferolaterally, new, NO stemi    RADIOLOGY I independently reviewed and interpreted chest x-ray and see no obvious focal consolidations or pneumothorax I also reviewed radiologist's formal read.   PROCEDURES:  Critical Care performed: Yes, see critical care procedure note(s)  .Critical Care  Performed by: Pilar Jarvis, MD Authorized by: Pilar Jarvis, MD   Critical care provider statement:    Critical  care time (minutes):  30   Critical care was time spent personally by me on the following activities:  Development of treatment plan with patient or surrogate, discussions with consultants, evaluation of patient's response to treatment, examination of patient, ordering and review of laboratory studies, ordering and review of radiographic studies, ordering and performing treatments and interventions, pulse oximetry, re-evaluation of patient's condition and review of old charts    MEDICATIONS ORDERED IN ED: Medications  nitroGLYCERIN (NITROSTAT) SL tablet 0.4 mg (0.4 mg Sublingual Given 12/24/22 1243)  heparin ADULT infusion 100 units/mL (25000 units/288mL) (950 Units/hr Intravenous New Bag/Given 12/24/22 1342)  acetaminophen (TYLENOL) tablet 1,000 mg (has no administration in time range)  aspirin EC tablet 81 mg (81 mg Oral Given 12/24/22 1527)  atorvastatin (LIPITOR) tablet 80 mg (has no administration in time range)  busPIRone (BUSPAR) tablet 5 mg (has no administration in time range)  nicotine (NICODERM CQ - dosed in mg/24 hr) patch 7 mg (7 mg Transdermal Patch Applied 12/24/22 1527)  PARoxetine (PAXIL) tablet 40 mg (40 mg Oral Given 12/24/22 1527)  levothyroxine (SYNTHROID) tablet 125 mcg (has no administration in time range)  famotidine (PEPCID) tablet 20 mg (has no administration in time range)  clopidogrel (PLAVIX) tablet 75 mg (75 mg Oral Given 12/24/22 1527)  gabapentin (NEURONTIN) capsule 300 mg (has no administration in time range)  loratadine (CLARITIN) tablet 10 mg (10 mg Oral Given 12/24/22 1527)  mometasone-formoterol (DULERA) 200-5 MCG/ACT inhaler 2 puff (has no administration in time range)  HYDROmorphone (DILAUDID) injection 0.5-1 mg (has no administration in time range)  ondansetron (ZOFRAN) tablet 4 mg ( Oral See Alternative 12/24/22 1541)    Or  ondansetron (ZOFRAN) injection 4 mg (4 mg Intravenous Given 12/24/22 1541)  0.9 %  sodium chloride infusion ( Intravenous New Bag/Given  12/24/22 1535)  labetalol (NORMODYNE) injection 10 mg (has no administration in time range)  LORazepam (ATIVAN) tablet 1-4 mg (has no administration in time range)    Or  LORazepam (ATIVAN) injection 1-4 mg (has no administration in time range)  thiamine (VITAMIN B1) tablet 100 mg (100 mg Oral Given 12/24/22 1523)    Or  thiamine (VITAMIN B1) injection 100 mg ( Intravenous See Alternative 12/24/22 1523)  folic acid (FOLVITE) tablet 1 mg (1 mg Oral Given 12/24/22 1522)  multivitamin with minerals tablet 1 tablet (1 tablet Oral Given 12/24/22 1522)  carvedilol (COREG) tablet 25 mg (25 mg Oral Given 12/24/22 1527)  influenza vac split trivalent PF (FLULAVAL) injection 0.5 mL (has no administration in time range)  sodium chloride 0.9 % bolus 1,000 mL (1,000 mLs Intravenous New Bag/Given 12/24/22 1238)  LORazepam (ATIVAN) injection 1 mg (1 mg Intravenous Given 12/24/22 1239)  heparin bolus via infusion 4,000 Units (4,000 Units Intravenous Bolus from Bag 12/24/22 1343)    External physician / consultants:  I spoke with Dr. Cristal Deer End of cardiology to review EKG, not STEMI, regarding care plan for this patient.   IMPRESSION / MDM / ASSESSMENT AND PLAN / ED COURSE  I reviewed the triage vital signs and the nursing notes.                                Patient's presentation is most consistent with acute presentation with potential threat to life or bodily function.  Differential diagnosis includes, but is not limited to, pancreatitis, cholecystitis, ACS, respiratory infection, perforated viscus   MDM:    Patient looks very uncomfortable with epigastric pain, EKG changes not meeting STEMI criteria, elevated troponin.  At first thought to be pancreatitis high suspicion given her history of the same and quality of symptoms similar, however lipase is unremarkable as are LFTs and her right upper quadrant ultrasound did not show any biliary or liver pathologies.  I started her on IV heparin for  concern of NSTEMI, reviewed the EKG and clinical presentation with Dr. Cristal Deer end of cardiology who agree not a STEMI activation at this time.  She has an AKI with a creatinine in the threes markedly elevated from prior, will give fluids.  CT abdomen pelvis unremarkable for pathologies.  Will admit for NSTEMI/AKI.       FINAL CLINICAL IMPRESSION(S) / ED DIAGNOSES   Final diagnoses:  NSTEMI (non-ST elevated myocardial infarction) (HCC)  AKI (acute kidney injury) (HCC)     Rx / DC Orders   ED Discharge Orders     None        Note:  This document was prepared using Dragon voice recognition software and may include unintentional dictation errors.    Pilar Jarvis, MD 12/24/22 [phone]

## 2022-12-24 NOTE — Progress Notes (Signed)
ANTICOAGULATION CONSULT NOTE - Initial Consult  Pharmacy Consult for Heparin Infusion Indication: chest pain/ACS  No Known Allergies  Patient Measurements: Height: 5\' 7"  (170.2 cm) Weight: 81.2 kg (179 lb 0.2 oz) IBW/kg (Calculated) : 61.6 Heparin Dosing Weight: 78.3 kg  Vital Signs: Temp: 98 F (36.7 C) (09/10 1052) Temp Source: Oral (09/10 1052) BP: 174/119 (09/10 1246) Pulse Rate: 88 (09/10 1246)  Labs: Recent Labs    12/24/22 1002 12/24/22 1229  HGB 12.8  --   HCT 40.6  --   PLT 218  --   CREATININE 3.63*  --   TROPONINIHS 275* 270*    Estimated Creatinine Clearance: 19.6 mL/min (A) (by C-G formula based on SCr of 3.63 mg/dL (H)).   Medical History: Past Medical History:  Diagnosis Date   Accelerated hypertension 11/13/2018   Allergic rhinitis due to pollen 06/28/2015   Anxiety with depression 02/18/2020   Arthritis    gout   Chest pain 11/12/2018   Chronic combined systolic (congestive) and diastolic (congestive) heart failure (HCC)    a. 02/2017 Echo: EF 30-35%, diff HK, sev HK/poss AK of ant and lat walls. Gr1 DD. Mild to mod MR. Nl RV fxn. Mod TR.   Chronic obstructive pulmonary disease (HCC) 09/02/2019   tx with trelegy and SABA   Chronic systolic heart failure (HCC) 11/21/2017   CKD (chronic kidney disease), stage IV (HCC)    Controlled substance agreement signed 09/07/2015   Coronary artery disease involving native coronary artery    Degenerative disc disease, cervical 03/17/2015   Depression, recurrent (HCC) 12/02/2017   Essential hypertension, malignant 02/12/2012   GAD (generalized anxiety disorder) 03/17/2015   History of acute anterior wall MI 02/12/2012   Hyperlipidemia    Hypothyroidism    Ischemic cardiomyopathy    a. 02/2012 Echo: EF 40-45%, ant/lat HK, Gr1 DD, Mild MR, PASP ; b. 02/2017 Echo: EF 30-35%.   NSVT (nonsustained ventricular tachycardia) (HCC)    a. 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 Troponins are elevated. Pharmacy consulted for Heparin dosing. No prior anticoagulants noted in medication review.  Goal of Therapy:  Heparin level 0.3-0.7 units/ml Monitor platelets by anticoagulation protocol: Yes   Plan:  Give 4000 units bolus x 1 Start heparin infusion at 950 units/hr Check anti-Xa level in 6 hours and daily while on heparin Continue to monitor H&H and platelets  Clovia Cuff, PharmD, BCPS 12/24/2022 1:12 PM

## 2022-12-24 NOTE — ED Notes (Signed)
Lab called and coming to draw blood.

## 2022-12-25 ENCOUNTER — Inpatient Hospital Stay: Payer: Medicaid Other

## 2022-12-25 DIAGNOSIS — Z720 Tobacco use: Secondary | ICD-10-CM

## 2022-12-25 DIAGNOSIS — N184 Chronic kidney disease, stage 4 (severe): Secondary | ICD-10-CM | POA: Diagnosis not present

## 2022-12-25 DIAGNOSIS — I10 Essential (primary) hypertension: Secondary | ICD-10-CM | POA: Diagnosis not present

## 2022-12-25 DIAGNOSIS — N1832 Chronic kidney disease, stage 3b: Secondary | ICD-10-CM | POA: Diagnosis not present

## 2022-12-25 DIAGNOSIS — I25118 Atherosclerotic heart disease of native coronary artery with other forms of angina pectoris: Secondary | ICD-10-CM

## 2022-12-25 DIAGNOSIS — I2489 Other forms of acute ischemic heart disease: Secondary | ICD-10-CM

## 2022-12-25 DIAGNOSIS — J9811 Atelectasis: Secondary | ICD-10-CM | POA: Diagnosis not present

## 2022-12-25 DIAGNOSIS — K852 Alcohol induced acute pancreatitis without necrosis or infection: Secondary | ICD-10-CM | POA: Diagnosis not present

## 2022-12-25 DIAGNOSIS — I517 Cardiomegaly: Secondary | ICD-10-CM | POA: Diagnosis not present

## 2022-12-25 DIAGNOSIS — R1011 Right upper quadrant pain: Secondary | ICD-10-CM | POA: Diagnosis not present

## 2022-12-25 DIAGNOSIS — N170 Acute kidney failure with tubular necrosis: Secondary | ICD-10-CM | POA: Diagnosis not present

## 2022-12-25 DIAGNOSIS — J441 Chronic obstructive pulmonary disease with (acute) exacerbation: Secondary | ICD-10-CM | POA: Diagnosis not present

## 2022-12-25 DIAGNOSIS — J811 Chronic pulmonary edema: Secondary | ICD-10-CM | POA: Diagnosis not present

## 2022-12-25 DIAGNOSIS — R7989 Other specified abnormal findings of blood chemistry: Secondary | ICD-10-CM

## 2022-12-25 DIAGNOSIS — R140 Abdominal distension (gaseous): Secondary | ICD-10-CM | POA: Diagnosis not present

## 2022-12-25 DIAGNOSIS — I214 Non-ST elevation (NSTEMI) myocardial infarction: Secondary | ICD-10-CM | POA: Diagnosis not present

## 2022-12-25 HISTORY — DX: Other forms of acute ischemic heart disease: I24.89

## 2022-12-25 LAB — BASIC METABOLIC PANEL
Anion gap: 10 (ref 5–15)
BUN: 63 mg/dL — ABNORMAL HIGH (ref 6–20)
CO2: 27 mmol/L (ref 22–32)
Calcium: 8.8 mg/dL — ABNORMAL LOW (ref 8.9–10.3)
Chloride: 104 mmol/L (ref 98–111)
Creatinine, Ser: 3.1 mg/dL — ABNORMAL HIGH (ref 0.44–1.00)
GFR, Estimated: 17 mL/min — ABNORMAL LOW (ref >60)
Glucose, Bld: 129 mg/dL — ABNORMAL HIGH (ref 70–99)
Potassium: 4.2 mmol/L (ref 3.5–5.1)
Sodium: 141 mmol/L (ref 135–145)

## 2022-12-25 LAB — CBC
HCT: 39.7 % (ref 36.0–46.0)
Hemoglobin: 12.1 g/dL (ref 12.0–15.0)
MCH: 32.5 pg (ref 26.0–34.0)
MCHC: 30.5 g/dL (ref 30.0–36.0)
MCV: 106.7 fL — ABNORMAL HIGH (ref 80.0–100.0)
Platelets: 239 10*3/uL (ref 150–400)
RBC: 3.72 MIL/uL — ABNORMAL LOW (ref 3.87–5.11)
RDW: 15.8 % — ABNORMAL HIGH (ref 11.5–15.5)
WBC: 11.2 10*3/uL — ABNORMAL HIGH (ref 4.0–10.5)
nRBC: 0.2 % (ref 0.0–0.2)

## 2022-12-25 LAB — TROPONIN I (HIGH SENSITIVITY): Troponin I (High Sensitivity): 145 ng/L (ref <18)

## 2022-12-25 LAB — LIPASE, BLOOD: Lipase: 37 U/L (ref 11–51)

## 2022-12-25 LAB — HIV ANTIBODY (ROUTINE TESTING W REFLEX): HIV Screen 4th Generation wRfx: NONREACTIVE

## 2022-12-25 LAB — HEPARIN LEVEL (UNFRACTIONATED)
Heparin Unfractionated: 0.29 [IU]/mL — ABNORMAL LOW (ref 0.30–0.70)
Heparin Unfractionated: 0.29 [IU]/mL — ABNORMAL LOW (ref 0.30–0.70)

## 2022-12-25 MED ORDER — TECHNETIUM TC 99M MEBROFENIN IV KIT
5.0000 | PACK | Freq: Once | INTRAVENOUS | Status: AC | PRN
  Administered 2022-12-25: 5.4 via INTRAVENOUS

## 2022-12-25 MED ORDER — HEPARIN BOLUS VIA INFUSION
1200.0000 [IU] | Freq: Once | INTRAVENOUS | Status: AC
  Administered 2022-12-25: 1200 [IU] via INTRAVENOUS
  Filled 2022-12-25: qty 1200

## 2022-12-25 MED ORDER — SODIUM CHLORIDE 0.9 % IV SOLN: INTRAVENOUS | Status: DC

## 2022-12-25 MED ORDER — METOCLOPRAMIDE HCL 5 MG/ML IJ SOLN
10.0000 mg | Freq: Four times a day (QID) | INTRAMUSCULAR | Status: DC | PRN
  Administered 2022-12-25 (×2): 10 mg via INTRAVENOUS
  Filled 2022-12-25 (×2): qty 2

## 2022-12-25 MED ORDER — CARVEDILOL 12.5 MG PO TABS
12.5000 mg | ORAL_TABLET | Freq: Two times a day (BID) | ORAL | Status: DC
  Administered 2022-12-26 – 2022-12-29 (×6): 12.5 mg via ORAL
  Filled 2022-12-25 (×6): qty 1

## 2022-12-25 MED ORDER — PANTOPRAZOLE SODIUM 40 MG PO TBEC
40.0000 mg | DELAYED_RELEASE_TABLET | Freq: Two times a day (BID) | ORAL | Status: DC
  Administered 2022-12-25 – 2022-12-29 (×9): 40 mg via ORAL
  Filled 2022-12-25 (×8): qty 1

## 2022-12-25 NOTE — Progress Notes (Signed)
PROGRESS NOTE    Angelica Herrera  ZOX:[id number] DOB: 08-18-1970 DOA: 12/24/2022 PCP: Danelle Berry, PA-C  Outpatient Specialists: cardiology, gi    Brief Narrative:   Angelica Herrera is a 52 y.o. female with medical history significant of alcoholic pancreatitis, CAD stenting, chronic HFrEF with LVEF 35-40%, renal artery stenosis status post stenting on left side, HTN, CKD stage IIIb, GERD, anxiety/depression, presented with worsening of abdominal pain, nauseous vomiting and diarrhea.   Patient has history of alcoholic pancreatitis but she continues to drink alcohol 3-4 times a week.  This time, her symptoms started 3 days ago with new onset of epigastric pain radiating to right RUQ, cramping-like intermittent, worsening with eating meals nonradiating, "I thought the pancreatitis has come back" associated with chills but no fever, same time she also developed frequent nauseous vomiting after eating meals and loose diarrhea.  She feels that overall her oral intake including solid food and liquids is decreased and her urine output also decreased as compared to her baseline.  Her PCP prescribed her with 2  "stomach medicine ", despite taking them she reports no improvement of any of her symptoms.  But denied any flank pain or back pain.  Denied any tenesmus.  She reported improvement of diarrhea overnight but continued to feel nauseous and vomiting when trying to eat.  Patient also reported that the abdominal pain she experienced a similar to the pain when she had her first pancreatitis but unlike the pain she experienced when she had MI.   Assessment & Plan:   Principal Problem:   NSTEMI (non-ST elevated myocardial infarction) (HCC) Active Problems:   Tobacco abuse   CKD (chronic kidney disease), stage IV (HCC)   GAD (generalized anxiety disorder)   Coronary artery disease involving native coronary artery   Accelerated hypertension   Chronic obstructive pulmonary disease (HCC)    Alcoholic pancreatitis   AKI (acute kidney injury) (HCC)   # Abdominal pain # Vomiting, diarrhea History of this patient at least several months. Epigastric and ruq. Hx of pancreatitis and lipase mildly elevated but no concerning findings on CT. Had EGD and colonoscopy in July significant only for gastritis. RUQ u/s this admission unremarkable as well. It's possible this is atypical chest pain (see below), but not likely. GI advised HIDA scan as next step in evaluation so will order that - f/u hida scan - f/u c diff and gi pathogen panel - ppi bid  # NSTEMI No chest pain but does have epigastric pain. Trops elevated to 200s, improved to 100s. With nonspecific TWIs. Per admitting provider cardiology consulted by EDP but spoke w/ cardiology today and they were unaware, will see patient in consult today - continue heparin for now  # AKI on ckd 3b # Hx RAS Baseline cr appears to be in mid 1s, here 3s, saw cardiology recently and there was concern about over-diuresis. Does report vomiting recently and does not appear fluid up on exam (despite pulm edema read on CXR). Kidney function improved somewhat today w/ fluids. Consider renal artery evaluation (hx RAS apparently with stent placement) if kidney function does not improve.  - holding diuretics - continue gentle fluids.   # HFrEF # CAD Does not appear exacerbated. Hx DES. EF 35-40 in 2020. - Diuretics and Entresto on hold - continue asa/plavix, atorva, coreg  # Chronic pain - cont home gabapentin  # GAD - cont home buspar, paxil   # Hypothyroidism -Continue Synthroid   # COPD Quiescent - cont home  dulera   # Alcohol abuse? Tells me last drink several days ago, denies regular alcohol use - monitor   DVT prophylaxis: IV heparin Code Status: full Family Communication: none @ bedside  Level of care: Telemetry Medical Status is: Inpatient Remains inpatient appropriate because: severity of illness    Consultants:   cardiology  Procedures: pending  Antimicrobials:  none    Subjective: Reports ongoing epigastric/ruq pain. Also very somnolent.   Objective: Vitals:   12/25/22 0033 12/25/22 0413 12/25/22 0415 12/25/22 0745  BP: 113/68 91/65  107/72  Pulse: 71 74 70 66  Resp: 19 16    Temp: 97.8 F (36.6 C) 97.7 F (36.5 C)    TempSrc:      SpO2: 91% (!) 89% 92% 94%  Weight:      Height:        Intake/Output Summary (Last 24 hours) at 12/25/2022 0822 Last data filed at 12/25/2022 0558 Gross per 24 hour  Intake 1471.99 ml  Output 400 ml  Net 1071.99 ml   Filed Weights   12/24/22 1000  Weight: 81.2 kg    Examination:  General exam: Appears calm and comfortable  Respiratory system: Clear to auscultation. Respiratory effort normal. Cardiovascular system: S1 & S2 heard, RRR. No JVD, murmurs, rubs, gallops or clicks. No pedal edema. Gastrointestinal system: Abdomen is nondistended, soft and tender ruq Central nervous system: Alert and oriented. No focal neurological deficits. Extremities: Symmetric 5 x 5 power. Skin: No rashes, lesions or ulcers Psychiatry: Judgement and insight appear normal. Mood & affect appropriate.     Data Reviewed: I have personally reviewed following labs and imaging studies  CBC: Recent Labs  Lab 12/24/22 1002 12/25/22 0436  WBC 13.0* 11.2*  HGB 12.8 12.1  HCT 40.6 39.7  MCV 100.2* 106.7*  PLT 218 239   Basic Metabolic Panel: Recent Labs  Lab 12/24/22 1002 12/25/22 0436  NA 140 141  K 4.3 4.2  CL 102 104  CO2 22 27  GLUCOSE 103* 129*  BUN 57* 63*  CREATININE 3.63* 3.10*  CALCIUM 9.6 8.8*  MG 2.3  --    GFR: Estimated Creatinine Clearance: 23 mL/min (A) (by C-G formula based on SCr of 3.1 mg/dL (H)). Liver Function Tests: Recent Labs  Lab 12/24/22 1002  AST 37  ALT 30  ALKPHOS 100  BILITOT 1.4*  PROT 7.2  ALBUMIN 4.4   Recent Labs  Lab 12/24/22 1002 12/25/22 0436  LIPASE 61* 37   No results for input(s): "AMMONIA" in  the last 168 hours. Coagulation Profile: Recent Labs  Lab 12/24/22 1636  INR 1.4*   Cardiac Enzymes: No results for input(s): "CKTOTAL", "CKMB", "CKMBINDEX", "TROPONINI" in the last 168 hours. BNP (last 3 results) No results for input(s): "PROBNP" in the last 8760 hours. HbA1C: No results for input(s): "HGBA1C" in the last 72 hours. CBG: No results for input(s): "GLUCAP" in the last 168 hours. Lipid Profile: No results for input(s): "CHOL", "HDL", "LDLCALC", "TRIG", "CHOLHDL", "LDLDIRECT" in the last 72 hours. Thyroid Function Tests: No results for input(s): "TSH", "T4TOTAL", "FREET4", "T3FREE", "THYROIDAB" in the last 72 hours. Anemia Panel: No results for input(s): "VITAMINB12", "FOLATE", "FERRITIN", "TIBC", "IRON", "RETICCTPCT" in the last 72 hours. Urine analysis:    Component Value Date/Time   COLORURINE YELLOW 03/17/2020 1042   APPEARANCEUR CLEAR 03/17/2020 1042   LABSPEC 1.020 03/17/2020 1042   PHURINE 6.0 03/17/2020 1042   GLUCOSEU NEGATIVE 03/17/2020 1042   HGBUR NEGATIVE 03/17/2020 1042   BILIRUBINUR Negative 05/31/2022 1115  KETONESUR NEGATIVE 03/17/2020 1042   PROTEINUR Positive (A) 05/31/2022 1115   PROTEINUR 1+ (A) 03/17/2020 1042   UROBILINOGEN 0.2 05/31/2022 1115   UROBILINOGEN 0.2 02/12/2012 1541   NITRITE Positive 05/31/2022 1115   NITRITE NEGATIVE 03/17/2020 1042   LEUKOCYTESUR Moderate (2+) (A) 05/31/2022 1115   LEUKOCYTESUR 1+ (A) 03/17/2020 1042   Sepsis Labs: @LABRCNTIP (procalcitonin:4,lacticidven:4)  )No results found for this or any previous visit (from the past 240 hour(s)).       Radiology Studies: DG Chest 1 View  Result Date: 12/25/2022 CLINICAL DATA:  52 year old female congestive heart failure. EXAM: CHEST  1 VIEW COMPARISON:  Portable chest 12/24/2022 and earlier. FINDINGS: Portable AP upright view at 0702 hours. Stable cardiomegaly and mediastinal contours. Stable lung volumes. Platelike atelectasis or scarring in the left mid  lung is stable. Pulmonary vascularity is stable with some Kerley B lines visible in the right lower lung. No pneumothorax, pleural effusion or consolidation. Visualized tracheal air column is within normal limits. No acute osseous abnormality identified. Paucity of bowel gas. IMPRESSION: Stable cardiomegaly and pulmonary interstitial edema, atelectasis. Electronically Signed   By: Odessa Fleming M.D.   On: 12/25/2022 07:22   US RENAL  Result Date: 12/24/2022 CLINICAL DATA:  Acute kidney injury. EXAM: RENAL / URINARY TRACT ULTRASOUND COMPLETE COMPARISON:  Abdomen and pelvis CT obtained today. FINDINGS: Right Kidney: Renal measurements: 7.0 x 3.8 x 2.9 cm = volume: 39 mL. Echogenicity within normal limits. No mass or hydronephrosis visualized. Left Kidney: Renal measurements: 7.8 x 3.5 x 3.5 cm = volume: 49 mL. Echogenicity within normal limits. No mass or hydronephrosis visualized. Bladder: Appears normal for degree of bladder distention. Other: None. IMPRESSION: Both kidneys are somewhat small.  Otherwise, normal examination. Electronically Signed   By: Beckie Salts M.D.   On: 12/24/2022 18:34   CT ABDOMEN PELVIS WO CONTRAST  Result Date: 12/24/2022 CLINICAL DATA:  Abdominal pain, acute, nonlocalized EXAM: CT ABDOMEN AND PELVIS WITHOUT CONTRAST TECHNIQUE: Multidetector CT imaging of the abdomen and pelvis was performed following the standard protocol without IV contrast. RADIATION DOSE REDUCTION: This exam was performed according to the departmental dose-optimization program which includes automated exposure control, adjustment of the mA and/or kV according to patient size and/or use of iterative reconstruction technique. COMPARISON:  CT AP, 10/16/2022 and 11/12/2018 FINDINGS: Suboptimal evaluation, secondary to a lack of intravenous contrast. Lower chest: Small pericardial effusion. Cardiomegaly, incompletely imaged. Hepatobiliary: Relatively hypodense liver. No focal abnormality. Prominent Riedel lobe. No  gallstones, gallbladder wall thickening, or biliary dilatation. Pancreas: No pancreatic ductal dilatation or surrounding inflammatory changes. Spleen: Normal in size without focal abnormality. Adrenals/Urinary Tract: Adrenal glands are unremarkable. Kidneys are normal, without renal calculi, focal lesion, or hydronephrosis. Bladder is unremarkable. Stomach/Bowel: Stomach is within normal limits. Appendix is not definitively visualized. No evidence of bowel wall thickening, distention, or inflammatory changes. Vascular/Lymphatic: Proximal LEFT renal stent. Moderate burden of aortic atherosclerosis without aneurysmal dilatation. No enlarged abdominal or pelvic lymph nodes. Reproductive: Hysterectomy.  No adnexal mass. Other: No abdominal wall hernia or abnormality. No abdominopelvic ascites. Musculoskeletal: No acute or significant osseous findings. IMPRESSION: Suboptimal evaluation, within these constraints; 1. No acute abdominopelvic process. 2. Cardiomegaly with small volume of pericardial effusion, incompletely imaged. 3. Hypodense liver. Findings most commonly seen in hepatic steatosis. Additional incidental, chronic and senescent findings as above. Electronically Signed   By: Roanna Banning M.D.   On: 12/24/2022 15:06   US ABDOMEN LIMITED RUQ (LIVER/GB)  Result Date: 12/24/2022 CLINICAL DATA:  Abdominal pain  EXAM: ULTRASOUND ABDOMEN LIMITED RIGHT UPPER QUADRANT COMPARISON:  None Available. FINDINGS: Gallbladder: No gallstones or wall thickening visualized. No sonographic Murphy sign noted by sonographer. Common bile duct: Diameter: 0.3 cm Liver: No focal lesion identified. Within normal limits in parenchymal echogenicity. Portal vein is patent on color Doppler imaging with normal direction of blood flow towards the liver. Other: None. IMPRESSION: 1. Normal sonographic appearance of the liver and gallbladder. Electronically Signed   By: Gaylyn Rong M.D.   On: 12/24/2022 13:19   DG Chest Port 1  View  Result Date: 12/24/2022 CLINICAL DATA:  Chest pain and cough.  Vomiting for 3 days EXAM: PORTABLE CHEST 1 VIEW COMPARISON:  02/25/2019 FINDINGS: Moderate cardiomegaly. Upper zone pulmonary vascular prominence favoring pulmonary venous hypertension. Bandlike atelectasis in the left mid lung. No blunting of the costophrenic angles. No significant bony abnormality identified. IMPRESSION: 1. Moderate cardiomegaly with pulmonary venous hypertension. 2. Bandlike atelectasis in the left mid lung. Electronically Signed   By: Gaylyn Rong M.D.   On: 12/24/2022 13:17        Scheduled Meds:  aspirin EC  81 mg Oral Daily   atorvastatin  80 mg Oral QHS   carvedilol  25 mg Oral BID WC   clopidogrel  75 mg Oral Daily   folic acid  1 mg Oral Daily   gabapentin  300 mg Oral BID   influenza vac split trivalent PF  0.5 mL Intramuscular Tomorrow-1000   levothyroxine  125 mcg Oral Q0600   loratadine  10 mg Oral Daily   mometasone-formoterol  2 puff Inhalation BID   multivitamin with minerals  1 tablet Oral Daily   nicotine  7 mg Transdermal Daily   PARoxetine  40 mg Oral Daily   thiamine  100 mg Oral Daily   Or   thiamine  100 mg Intravenous Daily   Continuous Infusions:  heparin 1,050 Units/hr (12/25/22 0803)     LOS: 1 day     Silvano Bilis, MD [facility]   If 7PM-7AM, please contact night-coverage [email or web address] Password [email or web address] 12/25/2022, 8:22 AM

## 2022-12-25 NOTE — Consult Note (Signed)
Cardiology Consultation   Patient ID: Angelica Herrera MRN: [id number]; DOB: Sep 08, 1970  Admit date: 12/24/2022 Date of Consult: 12/25/2022  PCP:  Danelle Berry, PA-C   [facility] Providers Cardiologist:  Lorine Bears, MD  Cardiology APP:  Sondra Barges, PA-C  {   Patient Profile:   Angelica Herrera is a 52 y.o. female with a hx of CAD with anterolateral STEMI in 01/2012 with PTCA/DES to the left main complicated hemodynamically instability and VT, HFrEF secondary to ICM, left renal artery stenosis s/p left renal artery stenting in 11/2018 by vascular surgery, CKD stage 3, HLD, hypothyroidism, depression, and tobacco use who is being seen 12/25/2022 for the evaluation of NSTEMI at the request of Dr. Ashok Pall.  History of Present Illness:   Angelica Herrera was admitted to the hospital in October 2013 with anterolateral STEMI.  She underwent emergent cardiac cath which showed occlusion of the left main coronary artery.  There was significant difficulty engaging the left main, though she ultimately underwent successful PCI/DES.  She also had PTCA done to the LAD due to embolization from the left main.  Postprocedure she has significant hemodynamic instability with VT and hypotension.  She was noted to have severe MR initially, however subsequent echocardiogram showed EF of 45% with no significant MR.  She was lost to follow-up from 2013-2017.  Upon reestablishing with our group 2017, she continued to smoke and had been off her medications.  In 2017, she had atypical chest pain with a Lexiscan MPI showing prior anterior infarct with mild peri-infarct ischemia and an EF of 33%.  She underwent repeat cardiac cath at that time which showed a patent left main stent with moderate mid LAD stenosis and an occluded D2 with faint collaterals.  RCA was very large with mild mid stenosis.  EF was 35 to 40% with anterior wall hypokinesis.  Medical therapy was advised.  She underwent repeat nuclear stress  testing in November 2018 for recurrent chest pain that showed a similar finding of prior anterior infarct with mild peri-infarct ischemia.  Echo at that time demonstrated EF of 30 to 35%, diffuse hypokinesis with severe hypokinesis/possible akinesis of the anterior and lateral walls, grade 1 diastolic dysfunction, mild to moderate MR, normal RV systolic function, mildly elevated PASP.  Most recent echo November 2020 showed EF of 35 to 40%, grade 2 diastolic dysfunction.  Patient was last seen in August 2024 for was overall stable from a cardiac perspective.  Patient presented to the ER on 12/24/2022 with chest pain, abdominal pain, nasuea and vomiting. She reported RUQ abdominal pain. Symptoms started about 3 days prior to presentation. She had associated chills, but no fever. She reports nausea, vomiting and diarrhea. She reported decreased oral intake. Denies flank or back pain. Pain is similar to prior pancreatitis episodes. Reported last drink was 3-4 days ago. She reports she was taking 2 of the torsemide pills.  In the ER blood pressure 151/98, pulse rate 81 bpm, respiratory rate 16, afebrile, 100% O2.  Labs showed lipase 61, blood glucose 103, serum creatinine 3.63, WBC 13, high-sensitivity troponin 275> 270.  Chest x-ray showed stable cardiomegaly and pulmonary interstitial edema.  EKG showed normal sinus rhythm with ST/T wave changes inferolateral leads.  Patient was started on IV heparin and admitted for further workup.  Past Medical History:  Diagnosis Date   Accelerated hypertension 11/13/2018   Allergic rhinitis due to pollen 06/28/2015   Anxiety with depression 02/18/2020   Arthritis  gout   Chest pain 11/12/2018   Chronic combined systolic (congestive) and diastolic (congestive) heart failure (HCC)    a. 02/2017 Echo: EF 30-35%, diff HK, sev HK/poss AK of ant and lat walls. Gr1 DD. Mild to mod MR. Nl RV fxn. Mod TR.   Chronic obstructive pulmonary disease (HCC) 09/02/2019   tx with  trelegy and SABA   Chronic systolic heart failure (HCC) 11/21/2017   CKD (chronic kidney disease), stage IV (HCC)    Controlled substance agreement signed 09/07/2015   Coronary artery disease involving native coronary artery    Degenerative disc disease, cervical 03/17/2015   Depression, recurrent (HCC) 12/02/2017   Essential hypertension, malignant 02/12/2012   GAD (generalized anxiety disorder) 03/17/2015   History of acute anterior wall MI 02/12/2012   Hyperlipidemia    Hypothyroidism    Ischemic cardiomyopathy    a. 02/2012 Echo: EF 40-45%, ant/lat HK, Gr1 DD, Mild MR, PASP ; b. 02/2017 Echo: EF 30-35%.   NSVT (nonsustained ventricular tachycardia) (HCC)    a. 02/2012 post-MI   Obesity    Pancreatitis    Rhinosinusitis 09/02/2019   S/P primary angioplasty with coronary stent 03/04/2018   Dual antiplatelet therapy INDEFINITELY per cardiology note Nov 2019   Squamous cell carcinoma of back 04/20/2019   referrred to sugery for f/up excision, derm not available for several month, lesion with rapid change cannot wait, still see derm for skin survey etc   Stage 3b chronic kidney disease (HCC) 02/18/2020   Tobacco abuse    Tobacco dependence 02/18/2020    Past Surgical History:  Procedure Laterality Date   ABDOMINAL HYSTERECTOMY     BIOPSY  10/30/2022   Procedure: BIOPSY;  Surgeon: Wyline Mood, MD;  Location: [facility] ENDOSCOPY;  Service: Gastroenterology;;   CARDIAC CATHETERIZATION  2013   [facility] s/p stent   CARDIAC CATHETERIZATION Left 08/03/2015   Procedure: Left Heart Cath and Coronary Angiography;  Surgeon: Iran Ouch, MD;  Location: [facility] INVASIVE CV LAB;  Service: Cardiovascular;  Laterality: Left;   COLONOSCOPY WITH PROPOFOL N/A 10/30/2022   Procedure: COLONOSCOPY WITH PROPOFOL;  Surgeon: Wyline Mood, MD;  Location: [facility] ENDOSCOPY;  Service: Gastroenterology;  Laterality: N/A;   DILATION AND CURETTAGE OF UTERUS     ESOPHAGOGASTRODUODENOSCOPY (EGD) WITH PROPOFOL N/A 10/30/2022    Procedure: ESOPHAGOGASTRODUODENOSCOPY (EGD) WITH PROPOFOL;  Surgeon: Wyline Mood, MD;  Location: [facility] ENDOSCOPY;  Service: Gastroenterology;  Laterality: N/A;   LEFT HEART CATHETERIZATION WITH CORONARY ANGIOGRAM N/A 02/11/2012   Procedure: LEFT HEART CATHETERIZATION WITH CORONARY ANGIOGRAM;  Surgeon: Tonny Bollman, MD;  Location: [facility] CATH LAB;  Service: Cardiovascular;  Laterality: N/A;   POLYPECTOMY  10/30/2022   Procedure: POLYPECTOMY;  Surgeon: Wyline Mood, MD;  Location: [facility] ENDOSCOPY;  Service: Gastroenterology;;   RENAL INTERVENTION N/A 11/17/2018   Procedure: RENAL INTERVENTION;  Surgeon: Renford Dills, MD;  Location: [facility] INVASIVE CV LAB;  Service: Cardiovascular;  Laterality: N/A;     Home Medications:  Prior to Admission medications   Medication Sig Start Date End Date Taking? Authorizing Provider  acetaminophen (TYLENOL) 500 MG tablet Take 1,000 mg by mouth every 6 (six) hours as needed.   Yes [provider]  aspirin 81 MG tablet Take 1 tablet (81 mg total) by mouth daily. 02/15/12  Yes Creig Hines, NP  atorvastatin (LIPITOR) 80 MG tablet Take 1 tablet (80 mg total) by mouth at bedtime. 07/30/22  Yes Danelle Berry, PA-C  busPIRone (BUSPAR) 5 MG tablet TAKE 1 TO 3 TABLETS BY MOUTH  TWICE DAILY AS NEEDED 12/12/22  Yes Danelle Berry, PA-C  carvedilol (COREG) 25 MG tablet TAKE (1) TABLET BY MOUTH TWICE DAILY Patient taking differently: Take 25 mg by mouth 2 (two) times daily with a meal. 09/30/22  Yes Dunn, Raymon Mutton, PA-C  cetirizine (ZYRTEC) 10 MG tablet TAKE (1) TABLET BY MOUTH  AT BEDTIME 11/15/22  Yes Danelle Berry, PA-C  clopidogrel (PLAVIX) 75 MG tablet TAKE (1) TABLET BY MOUTH EVERY DAY 07/30/22  Yes Danelle Berry, PA-C  colchicine 0.6 MG tablet Take 1 tablet (0.6 mg total) by mouth daily as needed (gout). 06/01/21  Yes Margarita Mail, DO  famotidine (PEPCID) 20 MG tablet Take 1 tablet (20 mg total) by mouth 2 (two) times daily as needed for heartburn or  indigestion. 05/28/22  Yes Danelle Berry, PA-C  gabapentin (NEURONTIN) 300 MG capsule Take 1 capsule (300 mg total) by mouth 2 (two) times daily. 07/30/22  Yes Danelle Berry, PA-C  levothyroxine (SYNTHROID) 125 MCG tablet TAKE (1) TABLET BY MOUTH EVERY MORNING BEFORE BREAKFAST 07/04/22  Yes Danelle Berry, PA-C  nitroGLYCERIN (NITROSTAT) 0.4 MG SL tablet Place 1 tablet (0.4 mg total) under the tongue every 5 (five) minutes x 3 doses as needed for chest pain. 08/25/19  Yes Alver Sorrow, NP  PARoxetine (PAXIL) 40 MG tablet TAKE (1) TABLET BY MOUTH EVERY DAY 10/10/22  Yes Danelle Berry, PA-C  sacubitril-valsartan (ENTRESTO) 97-103 MG Take 1 tablet by mouth 2 (two) times daily. 07/04/22  Yes Iran Ouch, MD  torsemide (DEMADEX) 20 MG tablet Take 1 tablet (20 mg total) by mouth daily. 11/18/22 02/16/23 Yes Creig Hines, NP  Na Sulfate-K Sulfate-Mg Sulf 17.5-3.13-1.6 GM/177ML SOLN Take by mouth. Patient not taking: Reported on 12/24/2022 09/17/22   [provider]  nicotine (NICODERM CQ - DOSED IN MG/24 HR) 7 mg/24hr patch Place 1 patch (7 mg total) onto the skin daily. Patient not taking: Reported on 12/24/2022 11/15/22   Sondra Barges, PA-C  ondansetron (ZOFRAN-ODT) 4 MG disintegrating tablet Take 1 tablet (4 mg total) by mouth every 8 (eight) hours as needed for nausea or vomiting. Patient not taking: Reported on 12/24/2022 07/30/22   Danelle Berry, PA-C  pantoprazole (PROTONIX) 40 MG tablet Take 1 tablet (40 mg total) by mouth 2 (two) times daily. Empty stomach - take more than 4 hours after taking levothyroxine Patient not taking: Reported on 12/24/2022 07/30/22   Danelle Berry, PA-C  potassium chloride SA (KLOR-CON M) 20 MEQ tablet Take 1 tablet (20 mEq total) by mouth daily. Patient not taking: Reported on 12/24/2022 07/04/22 06/29/23  Iran Ouch, MD    Inpatient Medications: Scheduled Meds:  aspirin EC  81 mg Oral Daily   atorvastatin  80 mg Oral QHS   carvedilol  25 mg Oral BID WC    clopidogrel  75 mg Oral Daily   folic acid  1 mg Oral Daily   gabapentin  300 mg Oral BID   influenza vac split trivalent PF  0.5 mL Intramuscular Tomorrow-1000   levothyroxine  125 mcg Oral Q0600   loratadine  10 mg Oral Daily   mometasone-formoterol  2 puff Inhalation BID   multivitamin with minerals  1 tablet Oral Daily   nicotine  7 mg Transdermal Daily   PARoxetine  40 mg Oral Daily   thiamine  100 mg Oral Daily   Or   thiamine  100 mg Intravenous Daily   Continuous Infusions:  heparin 1,050 Units/hr (12/25/22 0803)   PRN Meds:  acetaminophen, busPIRone, famotidine, HYDROmorphone (DILAUDID) injection, labetalol, LORazepam **OR** LORazepam, metoCLOPramide (REGLAN) injection, nitroGLYCERIN, ondansetron **OR** ondansetron (ZOFRAN) IV  Allergies:   No Known Allergies  Social History:   Social History   Socioeconomic History   Marital status: Legally Separated    Spouse name: Not on file   Number of children: Not on file   Years of education: Not on file   Highest education level: Not on file  Occupational History   Not on file  Tobacco Use   Smoking status: Every Day    Current packs/day: 0.25    Average packs/day: 0.3 packs/day for 30.0 years (7.5 ttl pk-yrs)    Types: Cigarettes   Smokeless tobacco: Never  Vaping Use   Vaping status: Never Used  Substance and Sexual Activity   Alcohol use: Not Currently    Comment: Says she previously drank socially - never a heavy drinker.  No etoh since 08/2018.   Drug use: No    Types: Marijuana   Sexual activity: Not on file  Other Topics Concern   Not on file  Social History Narrative   Lives in [place] w/ her dtr.  Does not routinely exercise.  Disabled/doesn't work.   Social Determinants of Health   Financial Resource Strain: Not on file  Food Insecurity: No Food Insecurity (12/24/2022)   Hunger Vital Sign    Worried About Running Out of Food in the Last Year: Never true    Ran Out of Food in the Last Year: Never  true  Transportation Needs: No Transportation Needs (12/24/2022)   PRAPARE - Administrator, Civil Service (Medical): No    Lack of Transportation (Non-Medical): No  Physical Activity: Not on file  Stress: Not on file  Social Connections: Not on file  Intimate Partner Violence: Not At Risk (12/24/2022)   Humiliation, Afraid, Rape, and Kick questionnaire    Fear of Current or Ex-Partner: No    Emotionally Abused: No    Physically Abused: No    Sexually Abused: No    Family History:    Family History  Problem Relation Age of Onset   Hypertension Father    Thyroid disease Father    AAA (abdominal aortic aneurysm) Father    Hypertension Mother    Hyperlipidemia Mother    Diabetes Mother    Kidney disease Mother    Hypertension Sister    Hypertension Sister    Supraventricular tachycardia Sister    Hypertension Brother    Hypertension Brother    Hypertension Daughter    Cancer Paternal Grandfather        lung     ROS:  Please see the history of present illness.   All other ROS reviewed and negative.     Physical Exam/Data:   Vitals:   12/25/22 0033 12/25/22 0413 12/25/22 0415 12/25/22 0745  BP: 113/68 91/65  107/72  Pulse: 71 74 70 66  Resp: 19 16    Temp: 97.8 F (36.6 C) 97.7 F (36.5 C)    TempSrc:      SpO2: 91% (!) 89% 92% 94%  Weight:      Height:        Intake/Output Summary (Last 24 hours) at 12/25/2022 0821 Last data filed at 12/25/2022 0558 Gross per 24 hour  Intake 1471.99 ml  Output 400 ml  Net 1071.99 ml      12/24/2022   10:00 AM 11/15/2022   11:38 AM 10/30/2022   12:31 PM  Last 3  Weights  Weight (lbs) 179 lb 0.2 oz 179 lb 172 lb 12.8 oz  Weight (kg) 81.2 kg 81.194 kg 78.382 kg     Body mass index is 28.04 kg/m.  General:  Well nourished, well developed, in no acute distress HEENT: normal Neck: no JVD Vascular: No carotid bruits; Distal pulses 2+ bilaterally Cardiac:  normal S1, S2; RRR; no murmur  Lungs:  clear to  auscultation bilaterally, no wheezing, rhonchi or rales  Abd: soft, nontender, no hepatomegaly  Ext: no edema Musculoskeletal:  No deformities, BUE and BLE strength normal and equal Skin: warm and dry  Neuro:  CNs 2-12 intact, no focal abnormalities noted Psych:  Normal affect   EKG:  The EKG was personally reviewed and demonstrates:  NSR inferolateral ST/T wave changes Telemetry:  Telemetry was personally reviewed and demonstrates:  NSR, NSVT 11 beat run, HR 60s  Relevant CV Studies:  Echo 2020  1. Left ventricular ejection fraction, by visual estimation, is 35 to  40%. The left ventricle has moderately decreased function. There is no  left ventricular hypertrophy. Anterior and Septal wall hypokinesis.   2. Left ventricular diastolic parameters are consistent with Grade II  diastolic dysfunction (pseudonormalization).   3. Mildly dilated left ventricular internal cavity size.   4. Global right ventricle has normal systolic function.The right  ventricular size is mildly enlarged. No increase in right ventricular wall  thickness.   5. Left atrial size was mild-moderately dilated.   6. Right atrial size was mildly dilated.   7. Moderately elevated pulmonary artery systolic pressure.   8. The tricuspid regurgitant velocity is 2.80 m/s, and with an assumed  right atrial pressure of 20 mmHg, the estimated right ventricular systolic  pressure is moderately elevated at 51.2 mmHg.   9. Small pericardial effusion.   MPI 2018 Narrative & Impression  There was no ST segment deviation noted during stress. No T wave inversion was noted during stress. Defect 1: There is a medium defect of severe severity present in the basal anterior, basal anterolateral, mid anterior and mid anterolateral location. This is an intermediate risk study. The left ventricular ejection fraction is moderately decreased (30-44%). Findings consistent with prior myocardial infarction with mild peri-infarct ischemia.     LHC 2017 Ost LM to LM lesion, 10% stenosed. The lesion was previously treated with a stent (unknown type). Mid LAD lesion, 60% stenosed. Mid RCA lesion, 30% stenosed. There is moderate left ventricular systolic dysfunction.   1. Widely patent ostial left main stent with moderate mid LAD stenosis and occluded second diagonal with faint collaterals. The right coronary artery is very large with mild mid stenosis. 2. Moderately reduced LV systolic function with an ejection fraction of 35-40% with significant anterior wall hypokinesis. Mildly elevated left ventricular end-diastolic pressure. 3. Significant radial artery spasm which required increased amounts of sedation.   Recommendations: Continue medical therapy for coronary artery disease and chronic systolic heart failure. We will need to transition her from clonidine to an ACE inhibitor or ARB with close monitoring of renal function.    Laboratory Data:  High Sensitivity Troponin:   Recent Labs  Lab 12/24/22 1002 12/24/22 1229 12/25/22 0436  TROPONINIHS 275* 270* 145*     Chemistry Recent Labs  Lab 12/24/22 1002 12/25/22 0436  NA 140 141  K 4.3 4.2  CL 102 104  CO2 22 27  GLUCOSE 103* 129*  BUN 57* 63*  CREATININE 3.63* 3.10*  CALCIUM 9.6 8.8*  MG 2.3  --  GFRNONAA 14* 17*  ANIONGAP 16* 10    Recent Labs  Lab 12/24/22 1002  PROT 7.2  ALBUMIN 4.4  AST 37  ALT 30  ALKPHOS 100  BILITOT 1.4*   Lipids No results for input(s): "CHOL", "TRIG", "HDL", "LABVLDL", "LDLCALC", "CHOLHDL" in the last 168 hours.  Hematology Recent Labs  Lab 12/24/22 1002 12/25/22 0436  WBC 13.0* 11.2*  RBC 4.05 3.72*  HGB 12.8 12.1  HCT 40.6 39.7  MCV 100.2* 106.7*  MCH 31.6 32.5  MCHC 31.5 30.5  RDW 15.8* 15.8*  PLT 218 239   Thyroid No results for input(s): "TSH", "FREET4" in the last 168 hours.  BNPNo results for input(s): "BNP", "PROBNP" in the last 168 hours.  DDimer No results for input(s): "DDIMER" in the last 168  hours.   Radiology/Studies:  DG Chest 1 View  Result Date: 12/25/2022 CLINICAL DATA:  52 year old female congestive heart failure. EXAM: CHEST  1 VIEW COMPARISON:  Portable chest 12/24/2022 and earlier. FINDINGS: Portable AP upright view at 0702 hours. Stable cardiomegaly and mediastinal contours. Stable lung volumes. Platelike atelectasis or scarring in the left mid lung is stable. Pulmonary vascularity is stable with some Kerley B lines visible in the right lower lung. No pneumothorax, pleural effusion or consolidation. Visualized tracheal air column is within normal limits. No acute osseous abnormality identified. Paucity of bowel gas. IMPRESSION: Stable cardiomegaly and pulmonary interstitial edema, atelectasis. Electronically Signed   By: Odessa Fleming M.D.   On: 12/25/2022 07:22   US RENAL  Result Date: 12/24/2022 CLINICAL DATA:  Acute kidney injury. EXAM: RENAL / URINARY TRACT ULTRASOUND COMPLETE COMPARISON:  Abdomen and pelvis CT obtained today. FINDINGS: Right Kidney: Renal measurements: 7.0 x 3.8 x 2.9 cm = volume: 39 mL. Echogenicity within normal limits. No mass or hydronephrosis visualized. Left Kidney: Renal measurements: 7.8 x 3.5 x 3.5 cm = volume: 49 mL. Echogenicity within normal limits. No mass or hydronephrosis visualized. Bladder: Appears normal for degree of bladder distention. Other: None. IMPRESSION: Both kidneys are somewhat small.  Otherwise, normal examination. Electronically Signed   By: Beckie Salts M.D.   On: 12/24/2022 18:34   CT ABDOMEN PELVIS WO CONTRAST  Result Date: 12/24/2022 CLINICAL DATA:  Abdominal pain, acute, nonlocalized EXAM: CT ABDOMEN AND PELVIS WITHOUT CONTRAST TECHNIQUE: Multidetector CT imaging of the abdomen and pelvis was performed following the standard protocol without IV contrast. RADIATION DOSE REDUCTION: This exam was performed according to the departmental dose-optimization program which includes automated exposure control, adjustment of the mA and/or  kV according to patient size and/or use of iterative reconstruction technique. COMPARISON:  CT AP, 10/16/2022 and 11/12/2018 FINDINGS: Suboptimal evaluation, secondary to a lack of intravenous contrast. Lower chest: Small pericardial effusion. Cardiomegaly, incompletely imaged. Hepatobiliary: Relatively hypodense liver. No focal abnormality. Prominent Riedel lobe. No gallstones, gallbladder wall thickening, or biliary dilatation. Pancreas: No pancreatic ductal dilatation or surrounding inflammatory changes. Spleen: Normal in size without focal abnormality. Adrenals/Urinary Tract: Adrenal glands are unremarkable. Kidneys are normal, without renal calculi, focal lesion, or hydronephrosis. Bladder is unremarkable. Stomach/Bowel: Stomach is within normal limits. Appendix is not definitively visualized. No evidence of bowel wall thickening, distention, or inflammatory changes. Vascular/Lymphatic: Proximal LEFT renal stent. Moderate burden of aortic atherosclerosis without aneurysmal dilatation. No enlarged abdominal or pelvic lymph nodes. Reproductive: Hysterectomy.  No adnexal mass. Other: No abdominal wall hernia or abnormality. No abdominopelvic ascites. Musculoskeletal: No acute or significant osseous findings. IMPRESSION: Suboptimal evaluation, within these constraints; 1. No acute abdominopelvic process. 2. Cardiomegaly with  small volume of pericardial effusion, incompletely imaged. 3. Hypodense liver. Findings most commonly seen in hepatic steatosis. Additional incidental, chronic and senescent findings as above. Electronically Signed   By: Roanna Banning M.D.   On: 12/24/2022 15:06   US ABDOMEN LIMITED RUQ (LIVER/GB)  Result Date: 12/24/2022 CLINICAL DATA:  Abdominal pain EXAM: ULTRASOUND ABDOMEN LIMITED RIGHT UPPER QUADRANT COMPARISON:  None Available. FINDINGS: Gallbladder: No gallstones or wall thickening visualized. No sonographic Murphy sign noted by sonographer. Common bile duct: Diameter: 0.3 cm Liver:  No focal lesion identified. Within normal limits in parenchymal echogenicity. Portal vein is patent on color Doppler imaging with normal direction of blood flow towards the liver. Other: None. IMPRESSION: 1. Normal sonographic appearance of the liver and gallbladder. Electronically Signed   By: Gaylyn Rong M.D.   On: 12/24/2022 13:19   DG Chest Port 1 View  Result Date: 12/24/2022 CLINICAL DATA:  Chest pain and cough.  Vomiting for 3 days EXAM: PORTABLE CHEST 1 VIEW COMPARISON:  02/25/2019 FINDINGS: Moderate cardiomegaly. Upper zone pulmonary vascular prominence favoring pulmonary venous hypertension. Bandlike atelectasis in the left mid lung. No blunting of the costophrenic angles. No significant bony abnormality identified. IMPRESSION: 1. Moderate cardiomegaly with pulmonary venous hypertension. 2. Bandlike atelectasis in the left mid lung. Electronically Signed   By: Gaylyn Rong M.D.   On: 12/24/2022 13:17     Assessment and Plan:   NSTEMI CAD s/p prior stenting -Patient presenting with chest pain and GI issues found to have troponin elevated to 200s with flat trend started on IV heparin -EKG with minimal ST-T wave changes inferolateral leads -also with AKI and acute pancreatitis -Echo ordered -troponin down trending - Last cath in 2017 recommending medical management (report above) -Continue aspirin 81 mg daily, Lipitor 80 mg daily, Plavix 75 mg daily, Coreg 25 mg twice daily -Patient may need cardiac cath, but defer at this time given other acute issues.  Acute pancreatitis Alcohol Abuse -Lipase 61>37 s/p IVF - last drink reportedly 3-4 days ago - thiamine - CIWA per IM  AKI on CKD stage 3 -Patient was taking 2 of the torsemide pills (40mg  in stead of 20mg ) and Entresto as outpatient -s/p IV fluids - scr/Bun improving - US kidneys normal  Chronic HFrEF ICM -Echo 2020 showed LVEF 35 to 40%, grade 2 diastolic dysfunction. -PTA torsemide and Entresto 97-103mg  BID,  now with AKI -Hold PTA meds - s/p IVF -Repeat echo - trend BMET - restart PTA meds as able  HTN - Bps soft at times   For questions or updates, please contact [facility] Please consult [email or web address] for contact info under    Signed, Katrianna Friesenhahn David Stall, PA-C  12/25/2022 8:21 AM

## 2022-12-25 NOTE — Progress Notes (Addendum)
ANTICOAGULATION CONSULT NOTE  Pharmacy Consult for Heparin Infusion Indication: chest pain/ACS  No Known Allergies  Patient Measurements: Height: 5\' 7"  (170.2 cm) Weight: 81.2 kg (179 lb 0.2 oz) IBW/kg (Calculated) : 61.6 Heparin Dosing Weight: 78.3 kg  Vital Signs: Temp: 98.1 F (36.7 C) (09/11 1358) Temp Source: Oral (09/11 1358) BP: 128/80 (09/11 1358) Pulse Rate: 69 (09/11 1358)  Labs: Recent Labs    12/24/22 1002 12/24/22 1229 12/24/22 1636 12/24/22 2208 12/25/22 0436 12/25/22 0712 12/25/22 1628  HGB 12.8  --   --   --  12.1  --   --   HCT 40.6  --   --   --  39.7  --   --   PLT 218  --   --   --  239  --   --   APTT  --   --  156*  --   --   --   --   LABPROT  --   --  17.5*  --   --   --   --   INR  --   --  1.4*  --   --   --   --   HEPARINUNFRC  --   --   --  0.32  --  0.29* 0.29*  CREATININE 3.63*  --   --   --  3.10*  --   --   TROPONINIHS 275* 270*  --   --  145*  --   --     Estimated Creatinine Clearance: 23 mL/min (A) (by C-G formula based on SCr of 3.1 mg/dL (H)).   Medical History: Past Medical History:  Diagnosis Date   Accelerated hypertension 11/13/2018   Allergic rhinitis due to pollen 06/28/2015   Anxiety with depression 02/18/2020   Arthritis    gout   Chest pain 11/12/2018   Chronic combined systolic (congestive) and diastolic (congestive) heart failure (HCC)    a. 02/2017 Echo: EF 30-35%, diff HK, sev HK/poss AK of ant and lat walls. Gr1 DD. Mild to mod MR. Nl RV fxn. Mod TR.   Chronic obstructive pulmonary disease (HCC) 09/02/2019   tx with trelegy and SABA   Chronic systolic heart failure (HCC) 11/21/2017   CKD (chronic kidney disease), stage IV (HCC)    Controlled substance agreement signed 09/07/2015   Coronary artery disease involving native coronary artery    Degenerative disc disease, cervical 03/17/2015   Depression, recurrent (HCC) 12/02/2017   Essential hypertension, malignant 02/12/2012   GAD (generalized anxiety disorder)  03/17/2015   History of acute anterior wall MI 02/12/2012   Hyperlipidemia    Hypothyroidism    Ischemic cardiomyopathy    a. 02/2012 Echo: EF 40-45%, ant/lat HK, Gr1 DD, Mild MR, PASP ; b. 02/2017 Echo: EF 30-35%.   NSVT (nonsustained ventricular tachycardia) (HCC)    a. 02/2012 post-MI   Obesity    Pancreatitis    Rhinosinusitis 09/02/2019   S/P primary angioplasty with coronary stent 03/04/2018   Dual antiplatelet therapy INDEFINITELY per cardiology note Nov 2019   Squamous cell carcinoma of back 04/20/2019   referrred to sugery for f/up excision, derm not available for several month, lesion with rapid change cannot wait, still see derm for skin survey etc   Stage 3b chronic kidney disease (HCC) 02/18/2020   Tobacco abuse    Tobacco dependence 02/18/2020    Assessment: Angelica Herrera is a 52 y.o. female presenting with epigastric pain. PMH significant for alcoholic pancreatitis, CAD, HFrEF  w/LVEF 35-40%, RAS s/p stenting, HTN, CKD3b, GERD, anxiety, depression. Patient was not on [facility] PTA per chart review. Pharmacy has been consulted to initiate and manage heparin infusion.   Baseline Labs: aPTT 156, PT 17.5, INR 1.4, Hgb 12.8, Hct 40.6, Plt 218   Goal of Therapy:  Heparin level 0.3-0.7 units/ml Monitor platelets by anticoagulation protocol: Yes   Date Time HL Rate/Comment  9/10 2208 0.32 950/therapeutic x1 9/11 0712 0.29 950/subtherapeutic 9/11 1653 0.29 1050/subthereapeutic  Plan:  Bolus 1200 units x 1 and increase heparin infusion to 1200 units/hr Check HL in 8 hours  Continue to monitor H&H and platelets daily while on heparin infusion    Will M. Dareen Piano, PharmD Clinical Pharmacist 12/25/2022 5:06 PM

## 2022-12-25 NOTE — Progress Notes (Signed)
ANTICOAGULATION CONSULT NOTE  Pharmacy Consult for Heparin Infusion Indication: chest pain/ACS  No Known Allergies  Patient Measurements: Height: 5\' 7"  (170.2 cm) Weight: 81.2 kg (179 lb 0.2 oz) IBW/kg (Calculated) : 61.6 Heparin Dosing Weight: 78.3 kg  Vital Signs: Temp: 97.7 F (36.5 C) (09/11 0413) BP: 107/72 (09/11 0745) Pulse Rate: 66 (09/11 0745)  Labs: Recent Labs    12/24/22 1002 12/24/22 1229 12/24/22 1636 12/24/22 2208 12/25/22 0436 12/25/22 0712  HGB 12.8  --   --   --  12.1  --   HCT 40.6  --   --   --  39.7  --   PLT 218  --   --   --  239  --   APTT  --   --  156*  --   --   --   LABPROT  --   --  17.5*  --   --   --   INR  --   --  1.4*  --   --   --   HEPARINUNFRC  --   --   --  0.32  --  0.29*  CREATININE 3.63*  --   --   --  3.10*  --   TROPONINIHS 275* 270*  --   --  145*  --     Estimated Creatinine Clearance: 23 mL/min (A) (by C-G formula based on SCr of 3.1 mg/dL (H)).   Medical History: Past Medical History:  Diagnosis Date   Accelerated hypertension 11/13/2018   Allergic rhinitis due to pollen 06/28/2015   Anxiety with depression 02/18/2020   Arthritis    gout   Chest pain 11/12/2018   Chronic combined systolic (congestive) and diastolic (congestive) heart failure (HCC)    a. 02/2017 Echo: EF 30-35%, diff HK, sev HK/poss AK of ant and lat walls. Gr1 DD. Mild to mod MR. Nl RV fxn. Mod TR.   Chronic obstructive pulmonary disease (HCC) 09/02/2019   tx with trelegy and SABA   Chronic systolic heart failure (HCC) 11/21/2017   CKD (chronic kidney disease), stage IV (HCC)    Controlled substance agreement signed 09/07/2015   Coronary artery disease involving native coronary artery    Degenerative disc disease, cervical 03/17/2015   Depression, recurrent (HCC) 12/02/2017   Essential hypertension, malignant 02/12/2012   GAD (generalized anxiety disorder) 03/17/2015   History of acute anterior wall MI 02/12/2012   Hyperlipidemia    Hypothyroidism     Ischemic cardiomyopathy    a. 02/2012 Echo: EF 40-45%, ant/lat HK, Gr1 DD, Mild MR, PASP ; b. 02/2017 Echo: EF 30-35%.   NSVT (nonsustained ventricular tachycardia) (HCC)    a. 02/2012 post-MI   Obesity    Pancreatitis    Rhinosinusitis 09/02/2019   S/P primary angioplasty with coronary stent 03/04/2018   Dual antiplatelet therapy INDEFINITELY per cardiology note Nov 2019   Squamous cell carcinoma of back 04/20/2019   referrred to sugery for f/up excision, derm not available for several month, lesion with rapid change cannot wait, still see derm for skin survey etc   Stage 3b chronic kidney disease (HCC) 02/18/2020   Tobacco abuse    Tobacco dependence 02/18/2020    Assessment: Angelica Herrera is a 52 y.o. female presenting with epigastric pain. PMH significant for alcoholic pancreatitis, CAD, HFrEF w/LVEF 35-40%, RAS s/p stenting, HTN, CKD3b, GERD, anxiety, depression. Patient was not on [facility] PTA per chart review. Pharmacy has been consulted to initiate and manage heparin infusion.   Baseline  Labs: aPTT 156, PT 17.5, INR 1.4, Hgb 12.8, Hct 40.6, Plt 218   Goal of Therapy:  Heparin level 0.3-0.7 units/ml Monitor platelets by anticoagulation protocol: Yes   Date Time HL Rate/Comment  9/10 2208 0.32 950/therapeutic x1 9/11 0712 0.29 950/subtherapeutic   Plan:  HL slightly subtherapeutic Increase heparin infusion to 1050 units/hr Check HL in 8 hours  Continue to monitor H&H and platelets daily while on heparin infusion   Celene Squibb, PharmD Clinical Pharmacist 12/25/2022 7:58 AM

## 2022-12-26 DIAGNOSIS — I10 Essential (primary) hypertension: Secondary | ICD-10-CM | POA: Diagnosis not present

## 2022-12-26 DIAGNOSIS — N170 Acute kidney failure with tubular necrosis: Secondary | ICD-10-CM

## 2022-12-26 DIAGNOSIS — Z720 Tobacco use: Secondary | ICD-10-CM | POA: Diagnosis not present

## 2022-12-26 DIAGNOSIS — J441 Chronic obstructive pulmonary disease with (acute) exacerbation: Secondary | ICD-10-CM | POA: Diagnosis not present

## 2022-12-26 DIAGNOSIS — N184 Chronic kidney disease, stage 4 (severe): Secondary | ICD-10-CM | POA: Diagnosis not present

## 2022-12-26 DIAGNOSIS — N1832 Chronic kidney disease, stage 3b: Secondary | ICD-10-CM

## 2022-12-26 DIAGNOSIS — R7989 Other specified abnormal findings of blood chemistry: Secondary | ICD-10-CM | POA: Diagnosis not present

## 2022-12-26 DIAGNOSIS — R748 Abnormal levels of other serum enzymes: Secondary | ICD-10-CM | POA: Diagnosis not present

## 2022-12-26 DIAGNOSIS — I214 Non-ST elevation (NSTEMI) myocardial infarction: Secondary | ICD-10-CM | POA: Diagnosis not present

## 2022-12-26 DIAGNOSIS — I25118 Atherosclerotic heart disease of native coronary artery with other forms of angina pectoris: Secondary | ICD-10-CM | POA: Diagnosis not present

## 2022-12-26 DIAGNOSIS — K852 Alcohol induced acute pancreatitis without necrosis or infection: Secondary | ICD-10-CM | POA: Diagnosis not present

## 2022-12-26 LAB — BASIC METABOLIC PANEL
Anion gap: 9 (ref 5–15)
BUN: 61 mg/dL — ABNORMAL HIGH (ref 6–20)
CO2: 25 mmol/L (ref 22–32)
Calcium: 8.3 mg/dL — ABNORMAL LOW (ref 8.9–10.3)
Chloride: 103 mmol/L (ref 98–111)
Creatinine, Ser: 2.72 mg/dL — ABNORMAL HIGH (ref 0.44–1.00)
GFR, Estimated: 20 mL/min — ABNORMAL LOW (ref >60)
Glucose, Bld: 108 mg/dL — ABNORMAL HIGH (ref 70–99)
Potassium: 4.7 mmol/L (ref 3.5–5.1)
Sodium: 137 mmol/L (ref 135–145)

## 2022-12-26 LAB — URINALYSIS, ROUTINE W REFLEX MICROSCOPIC
Bilirubin Urine: NEGATIVE
Glucose, UA: NEGATIVE mg/dL
Hgb urine dipstick: NEGATIVE
Ketones, ur: NEGATIVE mg/dL
Leukocytes,Ua: NEGATIVE
Nitrite: NEGATIVE
Protein, ur: NEGATIVE mg/dL
Specific Gravity, Urine: 1.008 (ref 1.005–1.030)
pH: 6 (ref 5.0–8.0)

## 2022-12-26 LAB — CBC
HCT: 34.8 % — ABNORMAL LOW (ref 36.0–46.0)
Hemoglobin: 10.6 g/dL — ABNORMAL LOW (ref 12.0–15.0)
MCH: 32 pg (ref 26.0–34.0)
MCHC: 30.5 g/dL (ref 30.0–36.0)
MCV: 105.1 fL — ABNORMAL HIGH (ref 80.0–100.0)
Platelets: 180 10*3/uL (ref 150–400)
RBC: 3.31 MIL/uL — ABNORMAL LOW (ref 3.87–5.11)
RDW: 15.1 % (ref 11.5–15.5)
WBC: 6.5 10*3/uL (ref 4.0–10.5)
nRBC: 0.5 % — ABNORMAL HIGH (ref 0.0–0.2)

## 2022-12-26 LAB — HEPARIN LEVEL (UNFRACTIONATED)
Heparin Unfractionated: 0.33 [IU]/mL (ref 0.30–0.70)
Heparin Unfractionated: 0.4 [IU]/mL (ref 0.30–0.70)

## 2022-12-26 MED ORDER — LORAZEPAM 1 MG PO TABS
1.0000 mg | ORAL_TABLET | ORAL | Status: DC | PRN
  Administered 2022-12-26 – 2022-12-28 (×3): 1 mg via ORAL
  Administered 2022-12-28: 2 mg via ORAL
  Administered 2022-12-28: 1 mg via ORAL
  Administered 2022-12-29 (×2): 2 mg via ORAL
  Administered 2022-12-29: 1 mg via ORAL
  Filled 2022-12-26: qty 2
  Filled 2022-12-26 (×2): qty 1
  Filled 2022-12-26: qty 2
  Filled 2022-12-26: qty 1
  Filled 2022-12-26: qty 2
  Filled 2022-12-26 (×2): qty 1

## 2022-12-26 MED ORDER — HYDROXYZINE HCL 25 MG PO TABS
50.0000 mg | ORAL_TABLET | Freq: Three times a day (TID) | ORAL | Status: DC | PRN
  Administered 2022-12-26 – 2022-12-28 (×4): 50 mg via ORAL
  Filled 2022-12-26 (×4): qty 2

## 2022-12-26 NOTE — Progress Notes (Signed)
ANTICOAGULATION CONSULT NOTE  Pharmacy Consult for Heparin Infusion Indication: chest pain/ACS  No Known Allergies  Patient Measurements: Height: 5\' 7"  (170.2 cm) Weight: 81.2 kg (179 lb 0.2 oz) IBW/kg (Calculated) : 61.6 Heparin Dosing Weight: 78.3 kg  Vital Signs: Temp: 97.4 F (36.3 C) (09/12 0810) Temp Source: Oral (09/12 0810) BP: 159/105 (09/12 0810) Pulse Rate: 72 (09/12 0810)  Labs: Recent Labs    12/24/22 1002 12/24/22 1229 12/24/22 1636 12/24/22 2208 12/25/22 0436 12/25/22 0712 12/25/22 1628 12/26/22 0206 12/26/22 1003  HGB 12.8  --   --   --  12.1  --   --  10.6*  --   HCT 40.6  --   --   --  39.7  --   --  34.8*  --   PLT 218  --   --   --  239  --   --  180  --   APTT  --   --  156*  --   --   --   --   --   --   LABPROT  --   --  17.5*  --   --   --   --   --   --   INR  --   --  1.4*  --   --   --   --   --   --   HEPARINUNFRC  --   --   --    < >  --    < > 0.29* 0.33 0.40  CREATININE 3.63*  --   --   --  3.10*  --   --  2.72*  --   TROPONINIHS 275* 270*  --   --  145*  --   --   --   --    < > = values in this interval not displayed.    Estimated Creatinine Clearance: 26.2 mL/min (A) (by C-G formula based on SCr of 2.72 mg/dL (H)).   Medical History: Past Medical History:  Diagnosis Date   Accelerated hypertension 11/13/2018   Allergic rhinitis due to pollen 06/28/2015   Anxiety with depression 02/18/2020   Arthritis    gout   Chest pain 11/12/2018   Chronic combined systolic (congestive) and diastolic (congestive) heart failure (HCC)    a. 02/2017 Echo: EF 30-35%, diff HK, sev HK/poss AK of ant and lat walls. Gr1 DD. Mild to mod MR. Nl RV fxn. Mod TR.   Chronic obstructive pulmonary disease (HCC) 09/02/2019   tx with trelegy and SABA   Chronic systolic heart failure (HCC) 11/21/2017   CKD (chronic kidney disease), stage IV (HCC)    Controlled substance agreement signed 09/07/2015   Coronary artery disease involving native coronary artery     Degenerative disc disease, cervical 03/17/2015   Depression, recurrent (HCC) 12/02/2017   Essential hypertension, malignant 02/12/2012   GAD (generalized anxiety disorder) 03/17/2015   History of acute anterior wall MI 02/12/2012   Hyperlipidemia    Hypothyroidism    Ischemic cardiomyopathy    a. 02/2012 Echo: EF 40-45%, ant/lat HK, Gr1 DD, Mild MR, PASP ; b. 02/2017 Echo: EF 30-35%.   NSVT (nonsustained ventricular tachycardia) (HCC)    a. 02/2012 post-MI   Obesity    Pancreatitis    Rhinosinusitis 09/02/2019   S/P primary angioplasty with coronary stent 03/04/2018   Dual antiplatelet therapy INDEFINITELY per cardiology note Nov 2019   Squamous cell carcinoma of back 04/20/2019   referrred to sugery for  f/up excision, derm not available for several month, lesion with rapid change cannot wait, still see derm for skin survey etc   Stage 3b chronic kidney disease (HCC) 02/18/2020   Tobacco abuse    Tobacco dependence 02/18/2020    Assessment: Angelica Herrera is a 52 y.o. female presenting with epigastric pain. PMH significant for alcoholic pancreatitis, CAD, HFrEF w/LVEF 35-40%, RAS s/p stenting, HTN, CKD3b, GERD, anxiety, depression. Patient was not on [facility] PTA per chart review. Pharmacy has been consulted to initiate and manage heparin infusion.   Baseline Labs: aPTT 156, PT 17.5, INR 1.4, Hgb 12.8, Hct 40.6, Plt 218   Goal of Therapy:  Heparin level 0.3-0.7 units/ml Monitor platelets by anticoagulation protocol: Yes   Date Time HL Rate/Comment  9/10 2208 0.32 950/therapeutic x1 9/11 0712 0.29 950/subtherapeutic 9/11 1653 0.29 1050/subthereapeutic 9/12 0206 0.33 1200/therapeutic x1  9/12 1003 0.40 1200/therapeutic x2   Plan:  Continue heparin infusion at 1200 units/hr Check HL daily while therapeutic on heparin infusion Continue to monitor H&H and platelets daily while on heparin infusion   Celene Squibb, PharmD Clinical Pharmacist 12/26/2022 11:24 AM

## 2022-12-26 NOTE — Progress Notes (Signed)
ANTICOAGULATION CONSULT NOTE  Pharmacy Consult for Heparin Infusion Indication: chest pain/ACS  No Known Allergies  Patient Measurements: Height: 5\' 7"  (170.2 cm) Weight: 81.2 kg (179 lb 0.2 oz) IBW/kg (Calculated) : 61.6 Heparin Dosing Weight: 78.3 kg  Vital Signs: Temp: 98.4 F (36.9 C) (09/11 2357) Temp Source: Oral (09/11 2043) BP: 102/59 (09/11 2357) Pulse Rate: 69 (09/11 2357)  Labs: Recent Labs    12/24/22 1002 12/24/22 1229 12/24/22 1636 12/24/22 2208 12/25/22 0436 12/25/22 0712 12/25/22 1628 12/26/22 0206  HGB 12.8  --   --   --  12.1  --   --  10.6*  HCT 40.6  --   --   --  39.7  --   --  34.8*  PLT 218  --   --   --  239  --   --  180  APTT  --   --  156*  --   --   --   --   --   LABPROT  --   --  17.5*  --   --   --   --   --   INR  --   --  1.4*  --   --   --   --   --   HEPARINUNFRC  --   --   --    < >  --  0.29* 0.29* 0.33  CREATININE 3.63*  --   --   --  3.10*  --   --  2.72*  TROPONINIHS 275* 270*  --   --  145*  --   --   --    < > = values in this interval not displayed.    Estimated Creatinine Clearance: 26.2 mL/min (A) (by C-G formula based on SCr of 2.72 mg/dL (H)).   Medical History: Past Medical History:  Diagnosis Date   Accelerated hypertension 11/13/2018   Allergic rhinitis due to pollen 06/28/2015   Anxiety with depression 02/18/2020   Arthritis    gout   Chest pain 11/12/2018   Chronic combined systolic (congestive) and diastolic (congestive) heart failure (HCC)    a. 02/2017 Echo: EF 30-35%, diff HK, sev HK/poss AK of ant and lat walls. Gr1 DD. Mild to mod MR. Nl RV fxn. Mod TR.   Chronic obstructive pulmonary disease (HCC) 09/02/2019   tx with trelegy and SABA   Chronic systolic heart failure (HCC) 11/21/2017   CKD (chronic kidney disease), stage IV (HCC)    Controlled substance agreement signed 09/07/2015   Coronary artery disease involving native coronary artery    Degenerative disc disease, cervical 03/17/2015   Depression,  recurrent (HCC) 12/02/2017   Essential hypertension, malignant 02/12/2012   GAD (generalized anxiety disorder) 03/17/2015   History of acute anterior wall MI 02/12/2012   Hyperlipidemia    Hypothyroidism    Ischemic cardiomyopathy    a. 02/2012 Echo: EF 40-45%, ant/lat HK, Gr1 DD, Mild MR, PASP ; b. 02/2017 Echo: EF 30-35%.   NSVT (nonsustained ventricular tachycardia) (HCC)    a. 02/2012 post-MI   Obesity    Pancreatitis    Rhinosinusitis 09/02/2019   S/P primary angioplasty with coronary stent 03/04/2018   Dual antiplatelet therapy INDEFINITELY per cardiology note Nov 2019   Squamous cell carcinoma of back 04/20/2019   referrred to sugery for f/up excision, derm not available for several month, lesion with rapid change cannot wait, still see derm for skin survey etc   Stage 3b chronic kidney disease (HCC) 02/18/2020  Tobacco abuse    Tobacco dependence 02/18/2020    Assessment: Angelica Herrera is a 52 y.o. female presenting with epigastric pain. PMH significant for alcoholic pancreatitis, CAD, HFrEF w/LVEF 35-40%, RAS s/p stenting, HTN, CKD3b, GERD, anxiety, depression. Patient was not on [facility] PTA per chart review. Pharmacy has been consulted to initiate and manage heparin infusion.   Baseline Labs: aPTT 156, PT 17.5, INR 1.4, Hgb 12.8, Hct 40.6, Plt 218   Goal of Therapy:  Heparin level 0.3-0.7 units/ml Monitor platelets by anticoagulation protocol: Yes   Date Time HL Rate/Comment  9/10 2208 0.32 950/therapeutic x1 9/11 0712 0.29 950/subtherapeutic 9/11 1653 0.29 1050/subthereapeutic 9/12     0206    0.33    1200/therapeutic X 1   Plan:  9/12:  HL @ 0206 = 0.33, therapeutic X 1 -  will continue pt on current rate and recheck HL on 9/12 @ 1000.  Continue to monitor H&H and platelets daily while on heparin infusion   Janalynn Eder D Clinical Pharmacist 12/26/2022 3:02 AM

## 2022-12-26 NOTE — Progress Notes (Signed)
Progress Note  Patient Name: Angelica Herrera Date of Encounter: 12/26/2022  Primary Cardiologist: Kirke Corin  Subjective   No chest pain or symptoms of cardiac decompensation. Leading up to her admission, she had several alcoholic drinks along with multiple tacos. Was taking torsemide 20 mg daily. Echo pending.   Inpatient Medications    Scheduled Meds:  aspirin EC  81 mg Oral Daily   atorvastatin  80 mg Oral QHS   carvedilol  12.5 mg Oral BID WC   clopidogrel  75 mg Oral Daily   folic acid  1 mg Oral Daily   gabapentin  300 mg Oral BID   influenza vac split trivalent PF  0.5 mL Intramuscular Tomorrow-1000   levothyroxine  125 mcg Oral Q0600   loratadine  10 mg Oral Daily   mometasone-formoterol  2 puff Inhalation BID   multivitamin with minerals  1 tablet Oral Daily   nicotine  7 mg Transdermal Daily   pantoprazole  40 mg Oral BID   PARoxetine  40 mg Oral Daily   thiamine  100 mg Oral Daily   Or   thiamine  100 mg Intravenous Daily   Continuous Infusions:  sodium chloride 75 mL/hr at 12/26/22 0549   heparin 1,200 Units/hr (12/25/22 1732)   PRN Meds: acetaminophen, busPIRone, famotidine, labetalol, metoCLOPramide (REGLAN) injection, nitroGLYCERIN, ondansetron **OR** ondansetron (ZOFRAN) IV   Vital Signs    Vitals:   12/25/22 1745 12/25/22 2043 12/25/22 2357 12/26/22 0339  BP: 96/65 99/66 (!) 102/59 110/68  Pulse: 74 74 69 64  Resp:  20 17 17   Temp: 98.5 F (36.9 C) 98.2 F (36.8 C) 98.4 F (36.9 C) 97.9 F (36.6 C)  TempSrc: Oral Oral    SpO2: 96% 92% 92% 95%  Weight:      Height:        Intake/Output Summary (Last 24 hours) at 12/26/2022 0810 Last data filed at 12/26/2022 0600 Gross per 24 hour  Intake 1364.62 ml  Output --  Net 1364.62 ml   Filed Weights   12/24/22 1000  Weight: 81.2 kg    Telemetry    SR with artifact - Personally Reviewed  ECG    No new tracings, ordered - Personally Reviewed  Physical Exam   GEN: No acute distress.    Neck: No JVD. Cardiac: RRR, no murmurs, rubs, or gallops.  Respiratory: Clear to auscultation bilaterally.  GI: Soft, nontender, non-distended.   MS: No edema; No deformity. Neuro:  Alert and oriented x 3; Nonfocal.  Psych: Normal affect.  Labs    Chemistry Recent Labs  Lab 12/24/22 1002 12/25/22 0436 12/26/22 0206  NA 140 141 137  K 4.3 4.2 4.7  CL 102 104 103  CO2 22 27 25   GLUCOSE 103* 129* 108*  BUN 57* 63* 61*  CREATININE 3.63* 3.10* 2.72*  CALCIUM 9.6 8.8* 8.3*  PROT 7.2  --   --   ALBUMIN 4.4  --   --   AST 37  --   --   ALT 30  --   --   ALKPHOS 100  --   --   BILITOT 1.4*  --   --   GFRNONAA 14* 17* 20*  ANIONGAP 16* 10 9     Hematology Recent Labs  Lab 12/24/22 1002 12/25/22 0436 12/26/22 0206  WBC 13.0* 11.2* 6.5  RBC 4.05 3.72* 3.31*  HGB 12.8 12.1 10.6*  HCT 40.6 39.7 34.8*  MCV 100.2* 106.7* 105.1*  MCH 31.6 32.5 32.0  MCHC 31.5 30.5 30.5  RDW 15.8* 15.8* 15.1  PLT 218 239 180    Cardiac EnzymesNo results for input(s): "TROPONINI" in the last 168 hours. No results for input(s): "TROPIPOC" in the last 168 hours.   BNPNo results for input(s): "BNP", "PROBNP" in the last 168 hours.   DDimer No results for input(s): "DDIMER" in the last 168 hours.   Radiology    NM Hepatobiliary Liver Func  Result Date: 12/25/2022 IMPRESSION: Gallbladder and duodenal activity identified compatible with a patent cystic and common bile ducts. Electronically Signed   By: Maudry Mayhew M.D.   On: 12/25/2022 12:38   DG Chest 1 View  Result Date: 12/25/2022 IMPRESSION: Stable cardiomegaly and pulmonary interstitial edema, atelectasis. Electronically Signed   By: Odessa Fleming M.D.   On: 12/25/2022 07:22   US RENAL  Result Date: 12/24/2022 IMPRESSION: Both kidneys are somewhat small.  Otherwise, normal examination. Electronically Signed   By: Beckie Salts M.D.   On: 12/24/2022 18:34   CT ABDOMEN PELVIS WO CONTRAST  Result Date: 12/24/2022 IMPRESSION: Suboptimal  evaluation, within these constraints; 1. No acute abdominopelvic process. 2. Cardiomegaly with small volume of pericardial effusion, incompletely imaged. 3. Hypodense liver. Findings most commonly seen in hepatic steatosis. Additional incidental, chronic and senescent findings as above. Electronically Signed   By: Roanna Banning M.D.   On: 12/24/2022 15:06   US ABDOMEN LIMITED RUQ (LIVER/GB)  Result Date: 12/24/2022 IMPRESSION: 1. Normal sonographic appearance of the liver and gallbladder. Electronically Signed   By: Gaylyn Rong M.D.   On: 12/24/2022 13:19   DG Chest Port 1 View  Result Date: 12/24/2022 IMPRESSION: 1. Moderate cardiomegaly with pulmonary venous hypertension. 2. Bandlike atelectasis in the left mid lung. Electronically Signed   By: Gaylyn Rong M.D.   On: 12/24/2022 13:17    Cardiac Studies   2D echo pending __________  Renal artery ultrasound 07/02/2022: Summary:  Largest Aortic Diameter: 2.4 cm    Renal:    Right: Normal size right kidney. No evidence of right renal artery         stenosis. Normal right Resisitive Index. Normal cortical         thickness of right kidney. RRV flow present.  Left:  Normal size of left kidney. No evidence of left renal artery         stenosis. LRV flow present. Normal left Resistive Index.         Normal cortical thickness of the left kidney.  Mesenteric:  Normal Celiac artery and Superior Mesenteric artery findings.  __________   2D echo 02/19/2019:  1. Left ventricular ejection fraction, by visual estimation, is 35 to  40%. The left ventricle has moderately decreased function. There is no  left ventricular hypertrophy. Anterior and Septal wall hypokinesis.   2. Left ventricular diastolic parameters are consistent with Grade II  diastolic dysfunction (pseudonormalization).   3. Mildly dilated left ventricular internal cavity size.   4. Global right ventricle has normal systolic function.The right  ventricular size is  mildly enlarged. No increase in right ventricular wall  thickness.   5. Left atrial size was mild-moderately dilated.   6. Right atrial size was mildly dilated.   7. Moderately elevated pulmonary artery systolic pressure.   8. The tricuspid regurgitant velocity is 2.80 m/s, and with an assumed  right atrial pressure of 20 mmHg, the estimated right ventricular systolic  pressure is moderately elevated at 51.2 mmHg.   9. Small pericardial effusion. __________  2D echo 02/21/2017: - Left ventricle: The cavity size was normal. Systolic function was    moderately to severely reduced. The estimated ejection fraction    was in the range of 30% to 35%. Diffuse hypokinesis with severe    hypokinesis /possible akinesis of the anterior and lateral walls.    Doppler parameters are consistent with abnormal left ventricular    relaxation (grade 1 diastolic dysfunction).  - Mitral valve: There was mild to moderate regurgitation.  - Left atrium: The atrium was normal in size.  - Right ventricle: Systolic function was normal.  - Tricuspid valve: There was moderate regurgitation.  - Pulmonary arteries: Systolic pressure was mildly elevated. __________   Eugenie Birks MPI 02/14/2017: There was no ST segment deviation noted during stress. No T wave inversion was noted during stress. Defect 1: There is a medium defect of severe severity present in the basal anterior, basal anterolateral, mid anterior and mid anterolateral location. This is an intermediate risk study. The left ventricular ejection fraction is moderately decreased (30-44%). Findings consistent with prior myocardial infarction with mild peri-infarct ischemia. __________   2D echo 10/06/2015: - Left ventricle: The cavity size was normal. Systolic function was    moderately reduced. The estimated ejection fraction was in the    range of 35% to 40%. Hypokinesis of the anterior myocardium.    Hypokinesis of the anteroseptal myocardium. Doppler  parameters    are consistent with abnormal left ventricular relaxation (grade 1    diastolic dysfunction).  - Mitral valve: Calcified annulus. There was mild regurgitation.  - Left atrium: The atrium was normal in size.  - Right ventricle: Systolic function was normal.  - Pulmonary arteries: Systolic pressure was within the normal    range. ___________   LHC 08/03/2015: Ost LM to LM lesion, 10% stenosed. The lesion was previously treated with a stent (unknown type). Mid LAD lesion, 60% stenosed. Mid RCA lesion, 30% stenosed. There is moderate left ventricular systolic dysfunction.   1. Widely patent ostial left main stent with moderate mid LAD stenosis and occluded second diagonal with faint collaterals. The right coronary artery is very large with mild mid stenosis. 2. Moderately reduced LV systolic function with an ejection fraction of 35-40% with significant anterior wall hypokinesis. Mildly elevated left ventricular end-diastolic pressure. 3. Significant radial artery spasm which required increased amounts of sedation.   Recommendations: Continue medical therapy for coronary artery disease and chronic systolic heart failure. We will need to transition her from clonidine to an ACE inhibitor or ARB with close monitoring of renal function. __________   Eugenie Birks MPI 07/28/2015: Pharmacological myocardial perfusion imaging study with attenuation corrected images suggesting mild anterior wall ischemia, mid to apical region Nonattenuation corrected images also with mild mid to apical anterior wall ischemia Patient attempted exercise on bruce protocol though unable to reach target heart rate, changed to lexiscan Anteroseptal wall hypokinesis noted, EF estimated at 33% No EKG changes concerning for ischemia at peak stress or in recovery. Moderate risk scan with mild ischemia as above __________   2D echo 02/12/2012: - Left ventricle: The cavity size was normal. Wall thickness    was  increased in a pattern of mild LVH. Systolic function    was mildly to moderately reduced. The estimated ejection    fraction was in the range of 40% to 45%. Hypokinesis of    the entireanterior and lateral myocardium. Doppler    parameters are consistent with abnormal left ventricular    relaxation (grade 1 diastolic  dysfunction).  - Mitral valve: Mild regurgitation.  - Pulmonary arteries: PA peak pressure: 43mm Hg (S).   Patient Profile     52 y.o. female with history of CAD with anterolateral ST elevation MI in 01/2012 with PTCA/DES to the left main at that time as detailed below complicated by hemodynamic instability and VT, HFrEF secondary to ICM, left renal artery stenosis status post left renal artery stenting in 11/2018 by vascular surgery, CKD stage III, HLD, hypothyroidism, depression, and tobacco use who is admitted with acute pancreatitis and we are seeing for elevated troponin.  Assessment & Plan    1.  CAD involving the native coronary arteries with elevated troponin: -Currently without symptoms of angina or cardiac decompensation -Initial and peak high-sensitivity troponin to 75 with subsequent downtrend -Suspect this is supply/demand ischemia in the setting of known CAD with acute pancreatitis and acute on CKD  -Remains on heparin drip, would continue until echo results are available to rule out new focal wall motion abnormality -Echo pending -Not currently a cath candidate with acute on CKD -Continue aspirin, clopidogrel, carvedilol, and atorvastatin  2.  HFrEF secondary to ICM: -Presented volume depleted -Entresto and torsemide currently held, resume as able moving forward -Not on MRA or SGLT2 inhibitor in the outpatient setting secondary to renal dysfunction  3.  Acute on CKD stage IIIb: -Renal function improving off diuretic and with IV hydration  4.  Acute pancreatitis: -In the setting of alcohol use -Improving  5.  Tobacco/alcohol use: -Complete cessation is  encouraged  6.  HTN with RAS: -Blood pressure on the high side this morning, likely in the setting of Entresto being held -Continue carvedilol -Resume Entresto as able -Status post left renal artery stenting -Recent renal artery ultrasound from 06/2022 showed patent stents.  7.  HLD: -LDL 36 -PTA atorvastatin      For questions or updates, please contact [facility] Please consult [email or web address] for contact info under Cardiology/STEMI.    Signed, Eula Listen, PA-C [facility] Pager: [phone] 12/26/2022, 8:10 AM

## 2022-12-26 NOTE — TOC Initial Note (Signed)
Transition of Care [facility]) - Initial/Assessment Note    Patient Details  Name: Angelica Herrera MRN: [id number] Date of Birth: October 11, 1970  [facility] Care [facility]) CM/SW Contact:    Truddie Hidden, RN Phone Number: 12/26/2022, 3:18 PM  Clinical Narrative:                 Patient refused substance abuse resources. "I don't need that."         Patient Goals and CMS Choice            Expected Discharge Plan and Services                                              Prior Living Arrangements/Services                       Activities of Daily Living Home Assistive Devices/Equipment: None ADL Screening (condition at time of admission) Patient's cognitive ability adequate to safely complete daily activities?: Yes Is the patient deaf or have difficulty hearing?: No Does the patient have difficulty seeing, even when wearing glasses/contacts?: No Does the patient have difficulty concentrating, remembering, or making decisions?: No Patient able to express need for assistance with ADLs?: Yes Does the patient have difficulty dressing or bathing?: No Independently performs ADLs?: Yes (appropriate for developmental age) Does the patient have difficulty walking or climbing stairs?: No Weakness of Legs: None Weakness of Arms/Hands: None  Permission Sought/Granted                  Emotional Assessment              Admission diagnosis:  NSTEMI (non-ST elevated myocardial infarction) (HCC) [I21.4] AKI (acute kidney injury) (HCC) [N17.9] COPD with acute exacerbation (HCC) [J44.1] QT prolongation [R94.31] Patient Active Problem List   Diagnosis Date Noted   Demand ischemia 12/25/2022   Elevated troponin 12/25/2022   NSTEMI (non-ST elevated myocardial infarction) (HCC) 12/24/2022   Acute renal failure with acute tubular necrosis superimposed on stage 3b chronic kidney disease (HCC) 12/24/2022   Family history of colon cancer 10/30/2022   Adenomatous  polyp of colon 10/30/2022   Generalized abdominal pain 10/30/2022   Elevated hemoglobin (HCC) 05/28/2022   History of pancreatitis 05/28/2022   Lack of access to transportation 12/27/2021   Arthritis 09/04/2021   Chronic combined systolic (congestive) and diastolic (congestive) heart failure (HCC) 09/04/2021   Alcoholic pancreatitis 09/04/2021   Stage 3b chronic kidney disease (HCC) 02/18/2020   Anxiety with depression 02/18/2020   Tobacco dependence 02/18/2020   Rhinosinusitis 09/02/2019   COPD with acute exacerbation (HCC) 09/02/2019   Squamous cell carcinoma of back 04/20/2019   Accelerated hypertension 11/13/2018   Chest pain 11/12/2018   S/P primary angioplasty with coronary stent 03/04/2018   Depression, recurrent (HCC) 12/02/2017   Chronic systolic heart failure (HCC) 11/21/2017   Controlled substance agreement signed 09/07/2015   Coronary artery disease involving native coronary artery    Allergic rhinitis due to pollen 06/28/2015   GAD (generalized anxiety disorder) 03/17/2015   Degenerative disc disease, cervical 03/17/2015   Hyperlipidemia    Obesity    CKD (chronic kidney disease), stage IV (HCC)    Ischemic cardiomyopathy    Hypothyroidism    History of acute anterior wall MI 02/12/2012   Essential hypertension, malignant 02/12/2012   Tobacco abuse 02/12/2012  PCP:  Danelle Berry, PA-C Pharmacy:   [facility], [place] - [address] [place] [address] Phone: [phone] Fax: [phone]     Social Determinants of Health (SDOH) Social History: SDOH Screenings   Food Insecurity: No Food Insecurity (12/24/2022)  Housing: Low Risk  (12/24/2022)  Transportation Needs: No Transportation Needs (12/24/2022)  Utilities: Not At Risk (12/24/2022)  Alcohol Screen: Low Risk  (01/31/2021)  Depression (PHQ2-9): Low Risk  (07/30/2022)  Tobacco Use: High Risk (12/24/2022)   SDOH Interventions:     Readmission Risk Interventions     No data to  display

## 2022-12-26 NOTE — Progress Notes (Signed)
PROGRESS NOTE    Angelica Herrera  UXN:[id number] DOB: 1970/11/23 DOA: 12/24/2022 PCP: Danelle Berry, PA-C  Outpatient Specialists: cardiology, gi    Brief Narrative:   Angelica Herrera is a 52 y.o. female with medical history significant of alcoholic pancreatitis, CAD stenting, chronic HFrEF with LVEF 35-40%, renal artery stenosis status post stenting on left side, HTN, CKD stage IIIb, GERD, anxiety/depression, presented with worsening of abdominal pain, nauseous vomiting and diarrhea.   Patient has history of alcoholic pancreatitis but she continues to drink alcohol 3-4 times a week.  This time, her symptoms started 3 days ago with new onset of epigastric pain radiating to right RUQ, cramping-like intermittent, worsening with eating meals nonradiating, "I thought the pancreatitis has come back" associated with chills but no fever, same time she also developed frequent nauseous vomiting after eating meals and loose diarrhea.  She feels that overall her oral intake including solid food and liquids is decreased and her urine output also decreased as compared to her baseline.  Her PCP prescribed her with 2  "stomach medicine ", despite taking them she reports no improvement of any of her symptoms.  But denied any flank pain or back pain.  Denied any tenesmus.  She reported improvement of diarrhea overnight but continued to feel nauseous and vomiting when trying to eat.  Patient also reported that the abdominal pain she experienced a similar to the pain when she had her first pancreatitis but unlike the pain she experienced when she had MI.   Assessment & Plan:   Principal Problem:   NSTEMI (non-ST elevated myocardial infarction) (HCC) Active Problems:   Tobacco abuse   CKD (chronic kidney disease), stage IV (HCC)   GAD (generalized anxiety disorder)   Coronary artery disease involving native coronary artery   Accelerated hypertension   Chronic obstructive pulmonary disease (HCC)    Alcoholic pancreatitis   AKI (acute kidney injury) (HCC)   Demand ischemia   Elevated troponin   # Abdominal pain # Vomiting, diarrhea History of this patient at least several months. Epigastric and ruq. Hx of pancreatitis and lipase mildly elevated but no concerning findings on CT. Had EGD and colonoscopy in July significant only for gastritis. RUQ u/s this admission unremarkable as well. It's possible this is atypical chest pain (see below), but not likely. GI advised HIDA scan as next step so we obtained that, also negative. Pain improved today - f/u c diff and gi pathogen panel if diarrhea, none since arrival - ppi bid  # NSTEMI No chest pain but does have epigastric pain. Trops elevated to 200s, improved to 100s. With nonspecific TWIs. - continue heparin for now pending TTE  # AKI on ckd 3b # Hx RAS Baseline cr appears to be in mid 1s, here 3s, saw cardiology recently and there was concern about over-diuresis. Does report vomiting recently and does not appear fluid up on exam (despite pulm edema read on CXR). Kidney function improving.  - holding diuretics - continue gentle fluids - will check renal artery dopplers    # HFrEF # CAD Does not appear exacerbated. Hx DES. EF 35-40 in 2020. - Diuretics and Entresto on hold - continue asa/plavix, atorva, coreg  # Chronic pain - cont home gabapentin  # GAD - cont home buspar, paxil   # Hypothyroidism -Continue Synthroid   # COPD Quiescent - cont home dulera   # Alcohol abuse Tells me last drink several days ago, denies regular alcohol use, denies hx withdrawal -  monitor   DVT prophylaxis: IV heparin Code Status: full Family Communication: none @ bedside  Level of care: Telemetry Medical Status is: Inpatient Remains inpatient appropriate because: AKI requiring IV fluids    Consultants:  cardiology  Procedures: none  Antimicrobials:  none    Subjective: Somolence resolved, tolerating diet, no vomiting  or diarrhea, abd pain improving  Objective: Vitals:   12/25/22 2043 12/25/22 2357 12/26/22 0339 12/26/22 0810  BP: 99/66 (!) 102/59 110/68 (!) 159/105  Pulse: 74 69 64 72  Resp: 20 17 17 16   Temp: 98.2 F (36.8 C) 98.4 F (36.9 C) 97.9 F (36.6 C) (!) 97.4 F (36.3 C)  TempSrc: Oral   Oral  SpO2: 92% 92% 95% 97%  Weight:      Height:        Intake/Output Summary (Last 24 hours) at 12/26/2022 0838 Last data filed at 12/26/2022 0600 Gross per 24 hour  Intake 1364.62 ml  Output --  Net 1364.62 ml   Filed Weights   12/24/22 1000  Weight: 81.2 kg    Examination:  General exam: Appears calm and comfortable  Respiratory system: Clear to auscultation. Respiratory effort normal. Cardiovascular system: S1 & S2 heard, RRR. No JVD, murmurs, rubs, gallops or clicks. No pedal edema. Gastrointestinal system: Abdomen is nondistended, soft and tender ruq Central nervous system: Alert and oriented. No focal neurological deficits. Extremities: Symmetric 5 x 5 power. Skin: No rashes, lesions or ulcers Psychiatry: Judgement and insight appear normal. Mood & affect appropriate.     Data Reviewed: I have personally reviewed following labs and imaging studies  CBC: Recent Labs  Lab 12/24/22 1002 12/25/22 0436 12/26/22 0206  WBC 13.0* 11.2* 6.5  HGB 12.8 12.1 10.6*  HCT 40.6 39.7 34.8*  MCV 100.2* 106.7* 105.1*  PLT 218 239 180   Basic Metabolic Panel: Recent Labs  Lab 12/24/22 1002 12/25/22 0436 12/26/22 0206  NA 140 141 137  K 4.3 4.2 4.7  CL 102 104 103  CO2 22 27 25   GLUCOSE 103* 129* 108*  BUN 57* 63* 61*  CREATININE 3.63* 3.10* 2.72*  CALCIUM 9.6 8.8* 8.3*  MG 2.3  --   --    GFR: Estimated Creatinine Clearance: 26.2 mL/min (A) (by C-G formula based on SCr of 2.72 mg/dL (H)). Liver Function Tests: Recent Labs  Lab 12/24/22 1002  AST 37  ALT 30  ALKPHOS 100  BILITOT 1.4*  PROT 7.2  ALBUMIN 4.4   Recent Labs  Lab 12/24/22 1002 12/25/22 0436  LIPASE  61* 37   No results for input(s): "AMMONIA" in the last 168 hours. Coagulation Profile: Recent Labs  Lab 12/24/22 1636  INR 1.4*   Cardiac Enzymes: No results for input(s): "CKTOTAL", "CKMB", "CKMBINDEX", "TROPONINI" in the last 168 hours. BNP (last 3 results) No results for input(s): "PROBNP" in the last 8760 hours. HbA1C: No results for input(s): "HGBA1C" in the last 72 hours. CBG: No results for input(s): "GLUCAP" in the last 168 hours. Lipid Profile: No results for input(s): "CHOL", "HDL", "LDLCALC", "TRIG", "CHOLHDL", "LDLDIRECT" in the last 72 hours. Thyroid Function Tests: No results for input(s): "TSH", "T4TOTAL", "FREET4", "T3FREE", "THYROIDAB" in the last 72 hours. Anemia Panel: No results for input(s): "VITAMINB12", "FOLATE", "FERRITIN", "TIBC", "IRON", "RETICCTPCT" in the last 72 hours. Urine analysis:    Component Value Date/Time   COLORURINE YELLOW 03/17/2020 1042   APPEARANCEUR CLEAR 03/17/2020 1042   LABSPEC 1.020 03/17/2020 1042   PHURINE 6.0 03/17/2020 1042   GLUCOSEU NEGATIVE 03/17/2020  1042   HGBUR NEGATIVE 03/17/2020 1042   BILIRUBINUR Negative 05/31/2022 1115   KETONESUR NEGATIVE 03/17/2020 1042   PROTEINUR Positive (A) 05/31/2022 1115   PROTEINUR 1+ (A) 03/17/2020 1042   UROBILINOGEN 0.2 05/31/2022 1115   UROBILINOGEN 0.2 02/12/2012 1541   NITRITE Positive 05/31/2022 1115   NITRITE NEGATIVE 03/17/2020 1042   LEUKOCYTESUR Moderate (2+) (A) 05/31/2022 1115   LEUKOCYTESUR 1+ (A) 03/17/2020 1042   Sepsis Labs: @LABRCNTIP (procalcitonin:4,lacticidven:4)  )No results found for this or any previous visit (from the past 240 hour(s)).       Radiology Studies: NM Hepatobiliary Liver Func  Result Date: 12/25/2022 CLINICAL DATA:  Right upper quadrant abdominal pain, biliary disease suspected. EXAM: NUCLEAR MEDICINE HEPATOBILIARY IMAGING TECHNIQUE: Sequential images of the abdomen were obtained out to 60 minutes following intravenous administration of  radiopharmaceutical. RADIOPHARMACEUTICALS:  5.4 mCi Tc-20m  Choletec IV COMPARISON:  CT December 24, 2022 FINDINGS: Prompt uptake and biliary excretion of activity by the liver is seen. Gallbladder activity is visualized, consistent with patency of cystic duct. Biliary activity passes into small bowel, consistent with patent common bile duct. IMPRESSION: Gallbladder and duodenal activity identified compatible with a patent cystic and common bile ducts. Electronically Signed   By: Maudry Mayhew M.D.   On: 12/25/2022 12:38   DG Chest 1 View  Result Date: 12/25/2022 CLINICAL DATA:  52 year old female congestive heart failure. EXAM: CHEST  1 VIEW COMPARISON:  Portable chest 12/24/2022 and earlier. FINDINGS: Portable AP upright view at 0702 hours. Stable cardiomegaly and mediastinal contours. Stable lung volumes. Platelike atelectasis or scarring in the left mid lung is stable. Pulmonary vascularity is stable with some Kerley B lines visible in the right lower lung. No pneumothorax, pleural effusion or consolidation. Visualized tracheal air column is within normal limits. No acute osseous abnormality identified. Paucity of bowel gas. IMPRESSION: Stable cardiomegaly and pulmonary interstitial edema, atelectasis. Electronically Signed   By: Odessa Fleming M.D.   On: 12/25/2022 07:22   US RENAL  Result Date: 12/24/2022 CLINICAL DATA:  Acute kidney injury. EXAM: RENAL / URINARY TRACT ULTRASOUND COMPLETE COMPARISON:  Abdomen and pelvis CT obtained today. FINDINGS: Right Kidney: Renal measurements: 7.0 x 3.8 x 2.9 cm = volume: 39 mL. Echogenicity within normal limits. No mass or hydronephrosis visualized. Left Kidney: Renal measurements: 7.8 x 3.5 x 3.5 cm = volume: 49 mL. Echogenicity within normal limits. No mass or hydronephrosis visualized. Bladder: Appears normal for degree of bladder distention. Other: None. IMPRESSION: Both kidneys are somewhat small.  Otherwise, normal examination. Electronically Signed   By: Beckie Salts M.D.   On: 12/24/2022 18:34   CT ABDOMEN PELVIS WO CONTRAST  Result Date: 12/24/2022 CLINICAL DATA:  Abdominal pain, acute, nonlocalized EXAM: CT ABDOMEN AND PELVIS WITHOUT CONTRAST TECHNIQUE: Multidetector CT imaging of the abdomen and pelvis was performed following the standard protocol without IV contrast. RADIATION DOSE REDUCTION: This exam was performed according to the departmental dose-optimization program which includes automated exposure control, adjustment of the mA and/or kV according to patient size and/or use of iterative reconstruction technique. COMPARISON:  CT AP, 10/16/2022 and 11/12/2018 FINDINGS: Suboptimal evaluation, secondary to a lack of intravenous contrast. Lower chest: Small pericardial effusion. Cardiomegaly, incompletely imaged. Hepatobiliary: Relatively hypodense liver. No focal abnormality. Prominent Riedel lobe. No gallstones, gallbladder wall thickening, or biliary dilatation. Pancreas: No pancreatic ductal dilatation or surrounding inflammatory changes. Spleen: Normal in size without focal abnormality. Adrenals/Urinary Tract: Adrenal glands are unremarkable. Kidneys are normal, without renal calculi, focal lesion, or hydronephrosis.  Bladder is unremarkable. Stomach/Bowel: Stomach is within normal limits. Appendix is not definitively visualized. No evidence of bowel wall thickening, distention, or inflammatory changes. Vascular/Lymphatic: Proximal LEFT renal stent. Moderate burden of aortic atherosclerosis without aneurysmal dilatation. No enlarged abdominal or pelvic lymph nodes. Reproductive: Hysterectomy.  No adnexal mass. Other: No abdominal wall hernia or abnormality. No abdominopelvic ascites. Musculoskeletal: No acute or significant osseous findings. IMPRESSION: Suboptimal evaluation, within these constraints; 1. No acute abdominopelvic process. 2. Cardiomegaly with small volume of pericardial effusion, incompletely imaged. 3. Hypodense liver. Findings most commonly  seen in hepatic steatosis. Additional incidental, chronic and senescent findings as above. Electronically Signed   By: Roanna Banning M.D.   On: 12/24/2022 15:06   US ABDOMEN LIMITED RUQ (LIVER/GB)  Result Date: 12/24/2022 CLINICAL DATA:  Abdominal pain EXAM: ULTRASOUND ABDOMEN LIMITED RIGHT UPPER QUADRANT COMPARISON:  None Available. FINDINGS: Gallbladder: No gallstones or wall thickening visualized. No sonographic Murphy sign noted by sonographer. Common bile duct: Diameter: 0.3 cm Liver: No focal lesion identified. Within normal limits in parenchymal echogenicity. Portal vein is patent on color Doppler imaging with normal direction of blood flow towards the liver. Other: None. IMPRESSION: 1. Normal sonographic appearance of the liver and gallbladder. Electronically Signed   By: Gaylyn Rong M.D.   On: 12/24/2022 13:19   DG Chest Port 1 View  Result Date: 12/24/2022 CLINICAL DATA:  Chest pain and cough.  Vomiting for 3 days EXAM: PORTABLE CHEST 1 VIEW COMPARISON:  02/25/2019 FINDINGS: Moderate cardiomegaly. Upper zone pulmonary vascular prominence favoring pulmonary venous hypertension. Bandlike atelectasis in the left mid lung. No blunting of the costophrenic angles. No significant bony abnormality identified. IMPRESSION: 1. Moderate cardiomegaly with pulmonary venous hypertension. 2. Bandlike atelectasis in the left mid lung. Electronically Signed   By: Gaylyn Rong M.D.   On: 12/24/2022 13:17        Scheduled Meds:  aspirin EC  81 mg Oral Daily   atorvastatin  80 mg Oral QHS   carvedilol  12.5 mg Oral BID WC   clopidogrel  75 mg Oral Daily   folic acid  1 mg Oral Daily   gabapentin  300 mg Oral BID   influenza vac split trivalent PF  0.5 mL Intramuscular Tomorrow-1000   levothyroxine  125 mcg Oral Q0600   loratadine  10 mg Oral Daily   mometasone-formoterol  2 puff Inhalation BID   multivitamin with minerals  1 tablet Oral Daily   nicotine  7 mg Transdermal Daily    pantoprazole  40 mg Oral BID   PARoxetine  40 mg Oral Daily   thiamine  100 mg Oral Daily   Or   thiamine  100 mg Intravenous Daily   Continuous Infusions:  sodium chloride 75 mL/hr at 12/26/22 0549   heparin 1,200 Units/hr (12/26/22 0823)     LOS: 2 days     Silvano Bilis, MD [facility]   If 7PM-7AM, please contact night-coverage [email or web address] Password [email or web address] 12/26/2022, 8:38 AM

## 2022-12-27 ENCOUNTER — Inpatient Hospital Stay: Payer: Medicaid Other

## 2022-12-27 ENCOUNTER — Inpatient Hospital Stay (HOSPITAL_COMMUNITY)
Admit: 2022-12-27 | Discharge: 2022-12-27 | Disposition: A | Discharge status: Home / Self Care | Payer: Medicaid Other | Attending: Cardiovascular Disease | Admitting: Cardiovascular Disease

## 2022-12-27 ENCOUNTER — Telehealth: Payer: Self-pay | Admitting: Physician Assistant

## 2022-12-27 DIAGNOSIS — I2489 Other forms of acute ischemic heart disease: Secondary | ICD-10-CM

## 2022-12-27 DIAGNOSIS — I214 Non-ST elevation (NSTEMI) myocardial infarction: Secondary | ICD-10-CM | POA: Diagnosis not present

## 2022-12-27 DIAGNOSIS — N1832 Chronic kidney disease, stage 3b: Secondary | ICD-10-CM

## 2022-12-27 DIAGNOSIS — N179 Acute kidney failure, unspecified: Secondary | ICD-10-CM | POA: Diagnosis not present

## 2022-12-27 LAB — ECHOCARDIOGRAM COMPLETE
AR max vel: 2.04 cm2
AV Area VTI: 1.89 cm2
AV Area mean vel: 1.92 cm2
AV Mean grad: 3 mmHg
AV Peak grad: 5.6 mmHg
Ao pk vel: 1.18 m/s
Area-P 1/2: 5.02 cm2
Calc EF: 41.7 %
Height: 67 in
MV M vel: 5.29 m/s
MV Peak grad: 111.9 mmHg
MV VTI: 1.13 cm2
Radius: 0.5 cm
S' Lateral: 3.8 cm
Single Plane A2C EF: 45.6 %
Single Plane A4C EF: 45.1 %
Weight: 2864.22 [oz_av]

## 2022-12-27 LAB — HEPARIN LEVEL (UNFRACTIONATED): Heparin Unfractionated: 0.42 [IU]/mL (ref 0.30–0.70)

## 2022-12-27 LAB — CBC
HCT: 38.6 % (ref 36.0–46.0)
Hemoglobin: 11.7 g/dL — ABNORMAL LOW (ref 12.0–15.0)
MCH: 32.1 pg (ref 26.0–34.0)
MCHC: 30.3 g/dL (ref 30.0–36.0)
MCV: 105.8 fL — ABNORMAL HIGH (ref 80.0–100.0)
Platelets: 189 10*3/uL (ref 150–400)
RBC: 3.65 MIL/uL — ABNORMAL LOW (ref 3.87–5.11)
RDW: 15.8 % — ABNORMAL HIGH (ref 11.5–15.5)
WBC: 7.6 10*3/uL (ref 4.0–10.5)
nRBC: 0.4 % — ABNORMAL HIGH (ref 0.0–0.2)

## 2022-12-27 LAB — BASIC METABOLIC PANEL
Anion gap: 9 (ref 5–15)
BUN: 42 mg/dL — ABNORMAL HIGH (ref 6–20)
CO2: 25 mmol/L (ref 22–32)
Calcium: 8.4 mg/dL — ABNORMAL LOW (ref 8.9–10.3)
Chloride: 107 mmol/L (ref 98–111)
Creatinine, Ser: 1.97 mg/dL — ABNORMAL HIGH (ref 0.44–1.00)
GFR, Estimated: 30 mL/min — ABNORMAL LOW (ref >60)
Glucose, Bld: 107 mg/dL — ABNORMAL HIGH (ref 70–99)
Potassium: 4.7 mmol/L (ref 3.5–5.1)
Sodium: 141 mmol/L (ref 135–145)

## 2022-12-27 MED ORDER — MORPHINE SULFATE (PF) 2 MG/ML IV SOLN
2.0000 mg | Freq: Once | INTRAVENOUS | Status: AC
  Administered 2022-12-27: 2 mg via INTRAVENOUS
  Filled 2022-12-27: qty 1

## 2022-12-27 NOTE — Progress Notes (Signed)
Progress Note  Patient Name: Angelica Herrera Date of Encounter: 12/27/2022  Primary Cardiologist: Kirke Corin  Subjective   Had some abdominal pain with dinner last evening. No chest pain or dyspnea. Echo remains pending. Renal function improving with holding if diuretic, Entresto, and with administration of IV fluids.   Inpatient Medications    Scheduled Meds:  aspirin EC  81 mg Oral Daily   atorvastatin  80 mg Oral QHS   carvedilol  12.5 mg Oral BID WC   clopidogrel  75 mg Oral Daily   folic acid  1 mg Oral Daily   gabapentin  300 mg Oral BID   influenza vac split trivalent PF  0.5 mL Intramuscular Tomorrow-1000   levothyroxine  125 mcg Oral Q0600   loratadine  10 mg Oral Daily   mometasone-formoterol  2 puff Inhalation BID   multivitamin with minerals  1 tablet Oral Daily   nicotine  7 mg Transdermal Daily   pantoprazole  40 mg Oral BID   PARoxetine  40 mg Oral Daily   thiamine  100 mg Oral Daily   Or   thiamine  100 mg Intravenous Daily   Continuous Infusions:  sodium chloride 75 mL/hr at 12/27/22 0555   heparin 1,200 Units/hr (12/27/22 0449)   PRN Meds: acetaminophen, busPIRone, famotidine, hydrOXYzine, labetalol, LORazepam, metoCLOPramide (REGLAN) injection, nitroGLYCERIN, ondansetron **OR** ondansetron (ZOFRAN) IV   Vital Signs    Vitals:   12/26/22 1635 12/26/22 1929 12/26/22 2331 12/27/22 0332  BP: (!) 133/90 (!) 147/96 (!) 139/97 109/69  Pulse: 69 70 66 68  Resp: 16 17 17 17   Temp: (!) 97.5 F (36.4 C) 98.3 F (36.8 C)  97.7 F (36.5 C)  TempSrc: Oral     SpO2: 95% 97% 98% 91%  Weight:      Height:        Intake/Output Summary (Last 24 hours) at 12/27/2022 0750 Last data filed at 12/27/2022 0449 Gross per 24 hour  Intake 2788.2 ml  Output --  Net 2788.2 ml   Filed Weights   12/24/22 1000  Weight: 81.2 kg    Telemetry    SR with artifact - Personally Reviewed  ECG    No new tracings, ordered - Personally Reviewed  Physical Exam    GEN: No acute distress.   Neck: No JVD. Cardiac: RRR, no murmurs, rubs, or gallops.  Respiratory: Clear to auscultation bilaterally.  GI: Soft, nontender, non-distended.   MS: No edema; No deformity. Neuro:  Alert and oriented x 3; Nonfocal.  Psych: Normal affect.  Labs    Chemistry Recent Labs  Lab 12/24/22 1002 12/25/22 0436 12/26/22 0206 12/27/22 0605  NA 140 141 137 141  K 4.3 4.2 4.7 4.7  CL 102 104 103 107  CO2 22 27 25 25   GLUCOSE 103* 129* 108* 107*  BUN 57* 63* 61* 42*  CREATININE 3.63* 3.10* 2.72* 1.97*  CALCIUM 9.6 8.8* 8.3* 8.4*  PROT 7.2  --   --   --   ALBUMIN 4.4  --   --   --   AST 37  --   --   --   ALT 30  --   --   --   ALKPHOS 100  --   --   --   BILITOT 1.4*  --   --   --   GFRNONAA 14* 17* 20* 30*  ANIONGAP 16* 10 9 9      Hematology Recent Labs  Lab 12/25/22 0436 12/26/22 0206  12/27/22 0605  WBC 11.2* 6.5 7.6  RBC 3.72* 3.31* 3.65*  HGB 12.1 10.6* 11.7*  HCT 39.7 34.8* 38.6  MCV 106.7* 105.1* 105.8*  MCH 32.5 32.0 32.1  MCHC 30.5 30.5 30.3  RDW 15.8* 15.1 15.8*  PLT 239 180 189    Cardiac EnzymesNo results for input(s): "TROPONINI" in the last 168 hours. No results for input(s): "TROPIPOC" in the last 168 hours.   BNPNo results for input(s): "BNP", "PROBNP" in the last 168 hours.   DDimer No results for input(s): "DDIMER" in the last 168 hours.   Radiology    NM Hepatobiliary Liver Func  Result Date: 12/25/2022 IMPRESSION: Gallbladder and duodenal activity identified compatible with a patent cystic and common bile ducts. Electronically Signed   By: Maudry Mayhew M.D.   On: 12/25/2022 12:38   DG Chest 1 View  Result Date: 12/25/2022 IMPRESSION: Stable cardiomegaly and pulmonary interstitial edema, atelectasis. Electronically Signed   By: Odessa Fleming M.D.   On: 12/25/2022 07:22   US RENAL  Result Date: 12/24/2022 IMPRESSION: Both kidneys are somewhat small.  Otherwise, normal examination. Electronically Signed   By: Beckie Salts M.D.   On: 12/24/2022 18:34   CT ABDOMEN PELVIS WO CONTRAST  Result Date: 12/24/2022 IMPRESSION: Suboptimal evaluation, within these constraints; 1. No acute abdominopelvic process. 2. Cardiomegaly with small volume of pericardial effusion, incompletely imaged. 3. Hypodense liver. Findings most commonly seen in hepatic steatosis. Additional incidental, chronic and senescent findings as above. Electronically Signed   By: Roanna Banning M.D.   On: 12/24/2022 15:06   US ABDOMEN LIMITED RUQ (LIVER/GB)  Result Date: 12/24/2022 IMPRESSION: 1. Normal sonographic appearance of the liver and gallbladder. Electronically Signed   By: Gaylyn Rong M.D.   On: 12/24/2022 13:19   DG Chest Port 1 View  Result Date: 12/24/2022 IMPRESSION: 1. Moderate cardiomegaly with pulmonary venous hypertension. 2. Bandlike atelectasis in the left mid lung. Electronically Signed   By: Gaylyn Rong M.D.   On: 12/24/2022 13:17    Cardiac Studies   2D echo pending __________  Renal artery ultrasound 07/02/2022: Summary:  Largest Aortic Diameter: 2.4 cm    Renal:    Right: Normal size right kidney. No evidence of right renal artery         stenosis. Normal right Resisitive Index. Normal cortical         thickness of right kidney. RRV flow present.  Left:  Normal size of left kidney. No evidence of left renal artery         stenosis. LRV flow present. Normal left Resistive Index.         Normal cortical thickness of the left kidney.  Mesenteric:  Normal Celiac artery and Superior Mesenteric artery findings.  __________   2D echo 02/19/2019:  1. Left ventricular ejection fraction, by visual estimation, is 35 to  40%. The left ventricle has moderately decreased function. There is no  left ventricular hypertrophy. Anterior and Septal wall hypokinesis.   2. Left ventricular diastolic parameters are consistent with Grade II  diastolic dysfunction (pseudonormalization).   3. Mildly dilated left  ventricular internal cavity size.   4. Global right ventricle has normal systolic function.The right  ventricular size is mildly enlarged. No increase in right ventricular wall  thickness.   5. Left atrial size was mild-moderately dilated.   6. Right atrial size was mildly dilated.   7. Moderately elevated pulmonary artery systolic pressure.   8. The tricuspid regurgitant velocity is 2.80  m/s, and with an assumed  right atrial pressure of 20 mmHg, the estimated right ventricular systolic  pressure is moderately elevated at 51.2 mmHg.   9. Small pericardial effusion. __________   2D echo 02/21/2017: - Left ventricle: The cavity size was normal. Systolic function was    moderately to severely reduced. The estimated ejection fraction    was in the range of 30% to 35%. Diffuse hypokinesis with severe    hypokinesis /possible akinesis of the anterior and lateral walls.    Doppler parameters are consistent with abnormal left ventricular    relaxation (grade 1 diastolic dysfunction).  - Mitral valve: There was mild to moderate regurgitation.  - Left atrium: The atrium was normal in size.  - Right ventricle: Systolic function was normal.  - Tricuspid valve: There was moderate regurgitation.  - Pulmonary arteries: Systolic pressure was mildly elevated. __________   Eugenie Birks MPI 02/14/2017: There was no ST segment deviation noted during stress. No T wave inversion was noted during stress. Defect 1: There is a medium defect of severe severity present in the basal anterior, basal anterolateral, mid anterior and mid anterolateral location. This is an intermediate risk study. The left ventricular ejection fraction is moderately decreased (30-44%). Findings consistent with prior myocardial infarction with mild peri-infarct ischemia. __________   2D echo 10/06/2015: - Left ventricle: The cavity size was normal. Systolic function was    moderately reduced. The estimated ejection fraction was in the     range of 35% to 40%. Hypokinesis of the anterior myocardium.    Hypokinesis of the anteroseptal myocardium. Doppler parameters    are consistent with abnormal left ventricular relaxation (grade 1    diastolic dysfunction).  - Mitral valve: Calcified annulus. There was mild regurgitation.  - Left atrium: The atrium was normal in size.  - Right ventricle: Systolic function was normal.  - Pulmonary arteries: Systolic pressure was within the normal    range. ___________   LHC 08/03/2015: Ost LM to LM lesion, 10% stenosed. The lesion was previously treated with a stent (unknown type). Mid LAD lesion, 60% stenosed. Mid RCA lesion, 30% stenosed. There is moderate left ventricular systolic dysfunction.   1. Widely patent ostial left main stent with moderate mid LAD stenosis and occluded second diagonal with faint collaterals. The right coronary artery is very large with mild mid stenosis. 2. Moderately reduced LV systolic function with an ejection fraction of 35-40% with significant anterior wall hypokinesis. Mildly elevated left ventricular end-diastolic pressure. 3. Significant radial artery spasm which required increased amounts of sedation.   Recommendations: Continue medical therapy for coronary artery disease and chronic systolic heart failure. We will need to transition her from clonidine to an ACE inhibitor or ARB with close monitoring of renal function. __________   Eugenie Birks MPI 07/28/2015: Pharmacological myocardial perfusion imaging study with attenuation corrected images suggesting mild anterior wall ischemia, mid to apical region Nonattenuation corrected images also with mild mid to apical anterior wall ischemia Patient attempted exercise on bruce protocol though unable to reach target heart rate, changed to lexiscan Anteroseptal wall hypokinesis noted, EF estimated at 33% No EKG changes concerning for ischemia at peak stress or in recovery. Moderate risk scan with mild ischemia  as above __________   2D echo 02/12/2012: - Left ventricle: The cavity size was normal. Wall thickness    was increased in a pattern of mild LVH. Systolic function    was mildly to moderately reduced. The estimated ejection    fraction was in  the range of 40% to 45%. Hypokinesis of    the entireanterior and lateral myocardium. Doppler    parameters are consistent with abnormal left ventricular    relaxation (grade 1 diastolic dysfunction).  - Mitral valve: Mild regurgitation.  - Pulmonary arteries: PA peak pressure: 43mm Hg (S).   Patient Profile     52 y.o. female with history of CAD with anterolateral ST elevation MI in 01/2012 with PTCA/DES to the left main at that time as detailed below complicated by hemodynamic instability and VT, HFrEF secondary to ICM, left renal artery stenosis status post left renal artery stenting in 11/2018 by vascular surgery, CKD stage III, HLD, hypothyroidism, depression, and tobacco use who is admitted with acute pancreatitis and we are seeing for elevated troponin.  Assessment & Plan    1.  CAD involving the native coronary arteries with elevated troponin: -Currently without symptoms of angina or cardiac decompensation -Initial and peak high-sensitivity troponin to 75 with subsequent downtrend -Suspect this is supply/demand ischemia in the setting of known CAD with acute pancreatitis and acute on CKD  -Remains on heparin drip, would continue until echo results are available to rule out new focal wall motion abnormality -Echo pending -Not currently a cath candidate with acute on CKD -Continue aspirin, clopidogrel, carvedilol, and atorvastatin  2.  HFrEF secondary to ICM: -Presented volume depleted -Entresto and torsemide currently held, resume as able moving forward -Not on MRA or SGLT2 inhibitor in the outpatient setting secondary to renal dysfunction  3.  Acute on CKD stage IIIb: -Renal function improving off diuretic and with IV hydration  4.   Acute pancreatitis: -In the setting of alcohol use and over-eating -Improving  5.  Tobacco/alcohol use: -Complete cessation is encouraged  6.  HTN with RAS: -Blood pressure improving, well controlled currently -Continue carvedilol -Resume Entresto as able -Status post left renal artery stenting -Recent renal artery ultrasound from 06/2022 showed patent stents.  7.  HLD: -LDL 36 -PTA atorvastatin      For questions or updates, please contact [facility] Please consult [email or web address] for contact info under Cardiology/STEMI.    Signed, Eula Listen, PA-C [facility] Pager: [phone] 12/27/2022, 7:50 AM

## 2022-12-27 NOTE — Telephone Encounter (Signed)
Please schedule patient to see me in the office in the next 7-14 days for hospital follow, if possible. She will also need a BMP in 5-7 days in the office. Dx: ICM. Thanks.

## 2022-12-27 NOTE — Progress Notes (Signed)
*  PRELIMINARY RESULTS* Echocardiogram 2D Echocardiogram has been performed.  Carolyne Fiscal 12/27/2022, 10:14 AM

## 2022-12-27 NOTE — Progress Notes (Signed)
PROGRESS NOTE    Angelica Herrera  WGN:[id number] DOB: 1970-11-06 DOA: 12/24/2022 PCP: Danelle Berry, PA-C  Outpatient Specialists: cardiology, gi    Brief Narrative:   Angelica Herrera is a 52 y.o. female with medical history significant of alcoholic pancreatitis, CAD stenting, chronic HFrEF with LVEF 35-40%, renal artery stenosis status post stenting on left side, HTN, CKD stage IIIb, GERD, anxiety/depression, presented with worsening of abdominal pain, nauseous vomiting and diarrhea.   Patient has history of alcoholic pancreatitis but she continues to drink alcohol 3-4 times a week.  This time, her symptoms started 3 days ago with new onset of epigastric pain radiating to right RUQ, cramping-like intermittent, worsening with eating meals nonradiating, "I thought the pancreatitis has come back" associated with chills but no fever, same time she also developed frequent nauseous vomiting after eating meals and loose diarrhea.  She feels that overall her oral intake including solid food and liquids is decreased and her urine output also decreased as compared to her baseline.  Her PCP prescribed her with 2  "stomach medicine ", despite taking them she reports no improvement of any of her symptoms.  But denied any flank pain or back pain.  Denied any tenesmus.  She reported improvement of diarrhea overnight but continued to feel nauseous and vomiting when trying to eat.  Patient also reported that the abdominal pain she experienced a similar to the pain when she had her first pancreatitis but unlike the pain she experienced when she had MI.   Assessment & Plan:   Principal Problem:   NSTEMI (non-ST elevated myocardial infarction) (HCC) Active Problems:   Tobacco abuse   CKD (chronic kidney disease), stage IV (HCC)   GAD (generalized anxiety disorder)   Coronary artery disease involving native coronary artery   Accelerated hypertension   COPD with acute exacerbation (HCC)   Alcoholic  pancreatitis   Acute renal failure with acute tubular necrosis superimposed on stage 3b chronic kidney disease (HCC)   Demand ischemia   Elevated troponin   # Abdominal pain # Vomiting, diarrhea History of this patient at least several months. Epigastric and ruq. Hx of pancreatitis and lipase mildly elevated but no concerning findings on CT. Had EGD and colonoscopy in July significant only for gastritis. RUQ u/s this admission unremarkable as well. It's possible this is atypical chest pain (see below), but not likely. GI advised HIDA scan as next step so we obtained that, also negative. Pain waxes and wanes - f/u c diff and gi pathogen panel if diarrhea, none since arrival - ppi bid  # demand ischemia No chest pain but does have epigastric pain. Trops elevated to 200s, improved to 100s. With nonspecific TWIs. TTE stable - per cardiology d/c heparin  # AKI on ckd 3b # Hx RAS Baseline cr appears to be in mid 1s, here 3s, saw cardiology recently and there was concern about over-diuresis. Does report vomiting recently and does not appear fluid up on exam (despite pulm edema read on CXR). Kidney function improving with fluids. Renal duplex mild abnormality, dr. Gilda Crease has reviewed, don't think findings are clinically significant, advises outpt f/u - d/c fluids today and f/u labs tomorrow   # HFrEF # CAD Does not appear exacerbated. Hx DES. EF 35-40 in 2020. - Diuretics and Entresto on hold - continue asa/plavix, atorva, coreg  # Chronic pain - cont home gabapentin  # GAD - cont home buspar, paxil   # Hypothyroidism -Continue Synthroid   # COPD Quiescent -  PROGRESS NOTE    Angelica Herrera  WGN:[id number] DOB: 1970-11-06 DOA: 12/24/2022 PCP: Danelle Berry, PA-C  Outpatient Specialists: cardiology, gi    Brief Narrative:   Angelica Herrera is a 52 y.o. female with medical history significant of alcoholic pancreatitis, CAD stenting, chronic HFrEF with LVEF 35-40%, renal artery stenosis status post stenting on left side, HTN, CKD stage IIIb, GERD, anxiety/depression, presented with worsening of abdominal pain, nauseous vomiting and diarrhea.   Patient has history of alcoholic pancreatitis but she continues to drink alcohol 3-4 times a week.  This time, her symptoms started 3 days ago with new onset of epigastric pain radiating to right RUQ, cramping-like intermittent, worsening with eating meals nonradiating, "I thought the pancreatitis has come back" associated with chills but no fever, same time she also developed frequent nauseous vomiting after eating meals and loose diarrhea.  She feels that overall her oral intake including solid food and liquids is decreased and her urine output also decreased as compared to her baseline.  Her PCP prescribed her with 2  "stomach medicine ", despite taking them she reports no improvement of any of her symptoms.  But denied any flank pain or back pain.  Denied any tenesmus.  She reported improvement of diarrhea overnight but continued to feel nauseous and vomiting when trying to eat.  Patient also reported that the abdominal pain she experienced a similar to the pain when she had her first pancreatitis but unlike the pain she experienced when she had MI.   Assessment & Plan:   Principal Problem:   NSTEMI (non-ST elevated myocardial infarction) (HCC) Active Problems:   Tobacco abuse   CKD (chronic kidney disease), stage IV (HCC)   GAD (generalized anxiety disorder)   Coronary artery disease involving native coronary artery   Accelerated hypertension   COPD with acute exacerbation (HCC)   Alcoholic  pancreatitis   Acute renal failure with acute tubular necrosis superimposed on stage 3b chronic kidney disease (HCC)   Demand ischemia   Elevated troponin   # Abdominal pain # Vomiting, diarrhea History of this patient at least several months. Epigastric and ruq. Hx of pancreatitis and lipase mildly elevated but no concerning findings on CT. Had EGD and colonoscopy in July significant only for gastritis. RUQ u/s this admission unremarkable as well. It's possible this is atypical chest pain (see below), but not likely. GI advised HIDA scan as next step so we obtained that, also negative. Pain waxes and wanes - f/u c diff and gi pathogen panel if diarrhea, none since arrival - ppi bid  # demand ischemia No chest pain but does have epigastric pain. Trops elevated to 200s, improved to 100s. With nonspecific TWIs. TTE stable - per cardiology d/c heparin  # AKI on ckd 3b # Hx RAS Baseline cr appears to be in mid 1s, here 3s, saw cardiology recently and there was concern about over-diuresis. Does report vomiting recently and does not appear fluid up on exam (despite pulm edema read on CXR). Kidney function improving with fluids. Renal duplex mild abnormality, dr. Gilda Crease has reviewed, don't think findings are clinically significant, advises outpt f/u - d/c fluids today and f/u labs tomorrow   # HFrEF # CAD Does not appear exacerbated. Hx DES. EF 35-40 in 2020. - Diuretics and Entresto on hold - continue asa/plavix, atorva, coreg  # Chronic pain - cont home gabapentin  # GAD - cont home buspar, paxil   # Hypothyroidism -Continue Synthroid   # COPD Quiescent -  PROGRESS NOTE    Angelica Herrera  WGN:[id number] DOB: 1970-11-06 DOA: 12/24/2022 PCP: Danelle Berry, PA-C  Outpatient Specialists: cardiology, gi    Brief Narrative:   Angelica Herrera is a 52 y.o. female with medical history significant of alcoholic pancreatitis, CAD stenting, chronic HFrEF with LVEF 35-40%, renal artery stenosis status post stenting on left side, HTN, CKD stage IIIb, GERD, anxiety/depression, presented with worsening of abdominal pain, nauseous vomiting and diarrhea.   Patient has history of alcoholic pancreatitis but she continues to drink alcohol 3-4 times a week.  This time, her symptoms started 3 days ago with new onset of epigastric pain radiating to right RUQ, cramping-like intermittent, worsening with eating meals nonradiating, "I thought the pancreatitis has come back" associated with chills but no fever, same time she also developed frequent nauseous vomiting after eating meals and loose diarrhea.  She feels that overall her oral intake including solid food and liquids is decreased and her urine output also decreased as compared to her baseline.  Her PCP prescribed her with 2  "stomach medicine ", despite taking them she reports no improvement of any of her symptoms.  But denied any flank pain or back pain.  Denied any tenesmus.  She reported improvement of diarrhea overnight but continued to feel nauseous and vomiting when trying to eat.  Patient also reported that the abdominal pain she experienced a similar to the pain when she had her first pancreatitis but unlike the pain she experienced when she had MI.   Assessment & Plan:   Principal Problem:   NSTEMI (non-ST elevated myocardial infarction) (HCC) Active Problems:   Tobacco abuse   CKD (chronic kidney disease), stage IV (HCC)   GAD (generalized anxiety disorder)   Coronary artery disease involving native coronary artery   Accelerated hypertension   COPD with acute exacerbation (HCC)   Alcoholic  pancreatitis   Acute renal failure with acute tubular necrosis superimposed on stage 3b chronic kidney disease (HCC)   Demand ischemia   Elevated troponin   # Abdominal pain # Vomiting, diarrhea History of this patient at least several months. Epigastric and ruq. Hx of pancreatitis and lipase mildly elevated but no concerning findings on CT. Had EGD and colonoscopy in July significant only for gastritis. RUQ u/s this admission unremarkable as well. It's possible this is atypical chest pain (see below), but not likely. GI advised HIDA scan as next step so we obtained that, also negative. Pain waxes and wanes - f/u c diff and gi pathogen panel if diarrhea, none since arrival - ppi bid  # demand ischemia No chest pain but does have epigastric pain. Trops elevated to 200s, improved to 100s. With nonspecific TWIs. TTE stable - per cardiology d/c heparin  # AKI on ckd 3b # Hx RAS Baseline cr appears to be in mid 1s, here 3s, saw cardiology recently and there was concern about over-diuresis. Does report vomiting recently and does not appear fluid up on exam (despite pulm edema read on CXR). Kidney function improving with fluids. Renal duplex mild abnormality, dr. Gilda Crease has reviewed, don't think findings are clinically significant, advises outpt f/u - d/c fluids today and f/u labs tomorrow   # HFrEF # CAD Does not appear exacerbated. Hx DES. EF 35-40 in 2020. - Diuretics and Entresto on hold - continue asa/plavix, atorva, coreg  # Chronic pain - cont home gabapentin  # GAD - cont home buspar, paxil   # Hypothyroidism -Continue Synthroid   # COPD Quiescent -  Urine analysis:    Component Value Date/Time   COLORURINE STRAW (A) 12/26/2022 2306   APPEARANCEUR CLEAR (A) 12/26/2022 2306   LABSPEC 1.008 12/26/2022 2306   PHURINE 6.0 12/26/2022 2306   GLUCOSEU NEGATIVE 12/26/2022 2306   HGBUR NEGATIVE 12/26/2022 2306   BILIRUBINUR NEGATIVE 12/26/2022 2306   BILIRUBINUR Negative 05/31/2022 1115   KETONESUR NEGATIVE 12/26/2022 2306   PROTEINUR NEGATIVE 12/26/2022 2306   UROBILINOGEN 0.2 05/31/2022 1115   UROBILINOGEN 0.2 02/12/2012 1541   NITRITE NEGATIVE 12/26/2022 2306   LEUKOCYTESUR NEGATIVE 12/26/2022 2306   Sepsis Labs: @LABRCNTIP (procalcitonin:4,lacticidven:4)  )No results found for this or any previous visit (from the past 240 hour(s)).       Radiology Studies: US RENAL ARTERY DUPLEX COMPLETE  Result Date: 12/27/2022 CLINICAL DATA:  [id number] AKI (acute kidney injury) (HCC) [id number] EXAM: RENAL/URINARY TRACT ULTRASOUND  RENAL DUPLEX DOPPLER ULTRASOUND COMPARISON:  CT September 2024 FINDINGS: Right Kidney: Length: 10.7 cm. Echogenicity within normal limits. No mass or hydronephrosis visualized. Left Kidney: Length: 10.0 cm. Echogenicity within normal limits. No mass or hydronephrosis visualized. Bladder:  Unremarkable. RENAL DUPLEX ULTRASOUND Right Renal Artery Velocities: Origin:  180 cm/sec Mid:  80 cm/sec Hilum:  59 cm/sec Interlobar:  29 cm/sec Arcuate: 13 cm/sec; normal resistive indices measuring on average 0.5 Left Renal Artery Velocities: Origin:  100 cm/sec Mid:  154 cm/sec Hilum:  75 cm/sec Interlobar:  39 cm/sec Arcuate: 13 cm/sec; borderline low resistive indices measuring between 0.42 and 0.44 Aortic Velocity:  62 cm/sec Right Renal-Aortic Ratios: Origin: 2.9 Mid:  1.3 Hilum: 1.0 Interlobar: 0.4 Arcuate: 0.2 Left Renal-Aortic Ratios: Origin: 1.6 Mid: 2.5 Hilum: 1.2 Interlobar: 0.6 Arcuate: 0.2 IMPRESSION: 1. Arterial velocities near the upper limit of normal are noted at the origin of the right renal artery, which could suggest stenosis approaching 50-60%. 2. The patient has a known left renal artery stent. There are slightly elevated velocities noted in the mid left renal artery, which could suggest an element of early in-stent stenosis. This is somewhat further supported by borderline low resistive indices measured in the arcuate arteries. These findings could be further evaluated with CT angiography if clinically appropriate. Electronically Signed   By: Olive Bass M.D.   On: 12/27/2022 14:49   ECHOCARDIOGRAM COMPLETE  Result Date: 12/27/2022    ECHOCARDIOGRAM REPORT   Patient Name:   SHONDEL SCHAAK Date of Exam: 12/27/2022 Medical Rec #:  [id number]           Height:       67.0 in Accession #:    [id number]          Weight:       179.0 lb Date of Birth:  Jan 23, 1971            BSA:          1.929 m Patient Age:    52 years            BP:           109/69 mmHg Patient Gender: F                   HR:            68 bpm. Exam Location:  [facility] Procedure: 2D Echo, 3D Echo, Cardiac Doppler, Color Doppler and Strain Analysis Indications:     NSTEMI  History:         Patient has prior history of Echocardiogram examinations, most  Urine analysis:    Component Value Date/Time   COLORURINE STRAW (A) 12/26/2022 2306   APPEARANCEUR CLEAR (A) 12/26/2022 2306   LABSPEC 1.008 12/26/2022 2306   PHURINE 6.0 12/26/2022 2306   GLUCOSEU NEGATIVE 12/26/2022 2306   HGBUR NEGATIVE 12/26/2022 2306   BILIRUBINUR NEGATIVE 12/26/2022 2306   BILIRUBINUR Negative 05/31/2022 1115   KETONESUR NEGATIVE 12/26/2022 2306   PROTEINUR NEGATIVE 12/26/2022 2306   UROBILINOGEN 0.2 05/31/2022 1115   UROBILINOGEN 0.2 02/12/2012 1541   NITRITE NEGATIVE 12/26/2022 2306   LEUKOCYTESUR NEGATIVE 12/26/2022 2306   Sepsis Labs: @LABRCNTIP (procalcitonin:4,lacticidven:4)  )No results found for this or any previous visit (from the past 240 hour(s)).       Radiology Studies: US RENAL ARTERY DUPLEX COMPLETE  Result Date: 12/27/2022 CLINICAL DATA:  [id number] AKI (acute kidney injury) (HCC) [id number] EXAM: RENAL/URINARY TRACT ULTRASOUND  RENAL DUPLEX DOPPLER ULTRASOUND COMPARISON:  CT September 2024 FINDINGS: Right Kidney: Length: 10.7 cm. Echogenicity within normal limits. No mass or hydronephrosis visualized. Left Kidney: Length: 10.0 cm. Echogenicity within normal limits. No mass or hydronephrosis visualized. Bladder:  Unremarkable. RENAL DUPLEX ULTRASOUND Right Renal Artery Velocities: Origin:  180 cm/sec Mid:  80 cm/sec Hilum:  59 cm/sec Interlobar:  29 cm/sec Arcuate: 13 cm/sec; normal resistive indices measuring on average 0.5 Left Renal Artery Velocities: Origin:  100 cm/sec Mid:  154 cm/sec Hilum:  75 cm/sec Interlobar:  39 cm/sec Arcuate: 13 cm/sec; borderline low resistive indices measuring between 0.42 and 0.44 Aortic Velocity:  62 cm/sec Right Renal-Aortic Ratios: Origin: 2.9 Mid:  1.3 Hilum: 1.0 Interlobar: 0.4 Arcuate: 0.2 Left Renal-Aortic Ratios: Origin: 1.6 Mid: 2.5 Hilum: 1.2 Interlobar: 0.6 Arcuate: 0.2 IMPRESSION: 1. Arterial velocities near the upper limit of normal are noted at the origin of the right renal artery, which could suggest stenosis approaching 50-60%. 2. The patient has a known left renal artery stent. There are slightly elevated velocities noted in the mid left renal artery, which could suggest an element of early in-stent stenosis. This is somewhat further supported by borderline low resistive indices measured in the arcuate arteries. These findings could be further evaluated with CT angiography if clinically appropriate. Electronically Signed   By: Olive Bass M.D.   On: 12/27/2022 14:49   ECHOCARDIOGRAM COMPLETE  Result Date: 12/27/2022    ECHOCARDIOGRAM REPORT   Patient Name:   SHONDEL SCHAAK Date of Exam: 12/27/2022 Medical Rec #:  [id number]           Height:       67.0 in Accession #:    [id number]          Weight:       179.0 lb Date of Birth:  Jan 23, 1971            BSA:          1.929 m Patient Age:    52 years            BP:           109/69 mmHg Patient Gender: F                   HR:            68 bpm. Exam Location:  [facility] Procedure: 2D Echo, 3D Echo, Cardiac Doppler, Color Doppler and Strain Analysis Indications:     NSTEMI  History:         Patient has prior history of Echocardiogram examinations, most  PROGRESS NOTE    Angelica Herrera  WGN:[id number] DOB: 1970-11-06 DOA: 12/24/2022 PCP: Danelle Berry, PA-C  Outpatient Specialists: cardiology, gi    Brief Narrative:   Angelica Herrera is a 52 y.o. female with medical history significant of alcoholic pancreatitis, CAD stenting, chronic HFrEF with LVEF 35-40%, renal artery stenosis status post stenting on left side, HTN, CKD stage IIIb, GERD, anxiety/depression, presented with worsening of abdominal pain, nauseous vomiting and diarrhea.   Patient has history of alcoholic pancreatitis but she continues to drink alcohol 3-4 times a week.  This time, her symptoms started 3 days ago with new onset of epigastric pain radiating to right RUQ, cramping-like intermittent, worsening with eating meals nonradiating, "I thought the pancreatitis has come back" associated with chills but no fever, same time she also developed frequent nauseous vomiting after eating meals and loose diarrhea.  She feels that overall her oral intake including solid food and liquids is decreased and her urine output also decreased as compared to her baseline.  Her PCP prescribed her with 2  "stomach medicine ", despite taking them she reports no improvement of any of her symptoms.  But denied any flank pain or back pain.  Denied any tenesmus.  She reported improvement of diarrhea overnight but continued to feel nauseous and vomiting when trying to eat.  Patient also reported that the abdominal pain she experienced a similar to the pain when she had her first pancreatitis but unlike the pain she experienced when she had MI.   Assessment & Plan:   Principal Problem:   NSTEMI (non-ST elevated myocardial infarction) (HCC) Active Problems:   Tobacco abuse   CKD (chronic kidney disease), stage IV (HCC)   GAD (generalized anxiety disorder)   Coronary artery disease involving native coronary artery   Accelerated hypertension   COPD with acute exacerbation (HCC)   Alcoholic  pancreatitis   Acute renal failure with acute tubular necrosis superimposed on stage 3b chronic kidney disease (HCC)   Demand ischemia   Elevated troponin   # Abdominal pain # Vomiting, diarrhea History of this patient at least several months. Epigastric and ruq. Hx of pancreatitis and lipase mildly elevated but no concerning findings on CT. Had EGD and colonoscopy in July significant only for gastritis. RUQ u/s this admission unremarkable as well. It's possible this is atypical chest pain (see below), but not likely. GI advised HIDA scan as next step so we obtained that, also negative. Pain waxes and wanes - f/u c diff and gi pathogen panel if diarrhea, none since arrival - ppi bid  # demand ischemia No chest pain but does have epigastric pain. Trops elevated to 200s, improved to 100s. With nonspecific TWIs. TTE stable - per cardiology d/c heparin  # AKI on ckd 3b # Hx RAS Baseline cr appears to be in mid 1s, here 3s, saw cardiology recently and there was concern about over-diuresis. Does report vomiting recently and does not appear fluid up on exam (despite pulm edema read on CXR). Kidney function improving with fluids. Renal duplex mild abnormality, dr. Gilda Crease has reviewed, don't think findings are clinically significant, advises outpt f/u - d/c fluids today and f/u labs tomorrow   # HFrEF # CAD Does not appear exacerbated. Hx DES. EF 35-40 in 2020. - Diuretics and Entresto on hold - continue asa/plavix, atorva, coreg  # Chronic pain - cont home gabapentin  # GAD - cont home buspar, paxil   # Hypothyroidism -Continue Synthroid   # COPD Quiescent -

## 2022-12-27 NOTE — Progress Notes (Signed)
ANTICOAGULATION CONSULT NOTE  Pharmacy Consult for Heparin Infusion Indication: chest pain/ACS  No Known Allergies  Patient Measurements: Height: 5\' 7"  (170.2 cm) Weight: 81.2 kg (179 lb 0.2 oz) IBW/kg (Calculated) : 61.6 Heparin Dosing Weight: 78.3 kg  Vital Signs: Temp: 97.7 F (36.5 C) (09/13 0332) BP: 109/69 (09/13 0332) Pulse Rate: 68 (09/13 0332)  Labs: Recent Labs    12/24/22 1002 12/24/22 1229 12/24/22 1636 12/24/22 2208 12/25/22 0436 12/25/22 0712 12/26/22 0206 12/26/22 1003 12/27/22 0605  HGB 12.8  --   --   --  12.1  --  10.6*  --  11.7*  HCT 40.6  --   --   --  39.7  --  34.8*  --  38.6  PLT 218  --   --   --  239  --  180  --  189  APTT  --   --  156*  --   --   --   --   --   --   LABPROT  --   --  17.5*  --   --   --   --   --   --   INR  --   --  1.4*  --   --   --   --   --   --   HEPARINUNFRC  --   --   --    < >  --    < > 0.33 0.40 0.42  CREATININE 3.63*  --   --   --  3.10*  --  2.72*  --   --   TROPONINIHS 275* 270*  --   --  145*  --   --   --   --    < > = values in this interval not displayed.    Estimated Creatinine Clearance: 26.2 mL/min (A) (by C-G formula based on SCr of 2.72 mg/dL (H)).   Medical History: Past Medical History:  Diagnosis Date   Accelerated hypertension 11/13/2018   Allergic rhinitis due to pollen 06/28/2015   Anxiety with depression 02/18/2020   Arthritis    gout   Chest pain 11/12/2018   Chronic combined systolic (congestive) and diastolic (congestive) heart failure (HCC)    a. 02/2017 Echo: EF 30-35%, diff HK, sev HK/poss AK of ant and lat walls. Gr1 DD. Mild to mod MR. Nl RV fxn. Mod TR.   Chronic obstructive pulmonary disease (HCC) 09/02/2019   tx with trelegy and SABA   Chronic systolic heart failure (HCC) 11/21/2017   CKD (chronic kidney disease), stage IV (HCC)    Controlled substance agreement signed 09/07/2015   Coronary artery disease involving native coronary artery    Degenerative disc disease, cervical  03/17/2015   Depression, recurrent (HCC) 12/02/2017   Essential hypertension, malignant 02/12/2012   GAD (generalized anxiety disorder) 03/17/2015   History of acute anterior wall MI 02/12/2012   Hyperlipidemia    Hypothyroidism    Ischemic cardiomyopathy    a. 02/2012 Echo: EF 40-45%, ant/lat HK, Gr1 DD, Mild MR, PASP ; b. 02/2017 Echo: EF 30-35%.   NSVT (nonsustained ventricular tachycardia) (HCC)    a. 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 female presenting with epigastric pain. PMH significant for alcoholic pancreatitis, CAD, HFrEF w/LVEF 35-40%, RAS s/p stenting, HTN, CKD3b, GERD, anxiety, depression. Patient was not on [facility] PTA per chart review. Pharmacy has been consulted to initiate and manage heparin infusion.   Baseline Labs: aPTT 156, PT 17.5, INR 1.4, Hgb 12.8, Hct 40.6, Plt 218   Goal of Therapy:  Heparin level 0.3-0.7 units/ml Monitor platelets by anticoagulation protocol: Yes   Date Time HL Rate/Comment  9/10 2208 0.32 950/therapeutic x1 9/11 0712 0.29 950/subtherapeutic 9/11 1653 0.29 1050/subthereapeutic 9/12 0206 0.33 1200/therapeutic x1  9/12 1003 0.40 1200/therapeutic x2  9/13     0605    0.42    1200/therapeutic X 3   Plan:  Continue heparin infusion at 1200 units/hr Check HL daily while therapeutic on heparin infusion Continue to monitor H&H and platelets daily while on heparin infusion   Baylen Buckner D, PharmD Clinical Pharmacist 12/27/2022 6:46  AM

## 2022-12-28 DIAGNOSIS — D631 Anemia in chronic kidney disease: Secondary | ICD-10-CM | POA: Diagnosis not present

## 2022-12-28 DIAGNOSIS — N1832 Chronic kidney disease, stage 3b: Secondary | ICD-10-CM | POA: Diagnosis not present

## 2022-12-28 DIAGNOSIS — R748 Abnormal levels of other serum enzymes: Secondary | ICD-10-CM | POA: Diagnosis not present

## 2022-12-28 DIAGNOSIS — N179 Acute kidney failure, unspecified: Secondary | ICD-10-CM | POA: Diagnosis not present

## 2022-12-28 DIAGNOSIS — I129 Hypertensive chronic kidney disease with stage 1 through stage 4 chronic kidney disease, or unspecified chronic kidney disease: Secondary | ICD-10-CM | POA: Diagnosis not present

## 2022-12-28 DIAGNOSIS — I214 Non-ST elevation (NSTEMI) myocardial infarction: Secondary | ICD-10-CM | POA: Diagnosis not present

## 2022-12-28 DIAGNOSIS — E875 Hyperkalemia: Secondary | ICD-10-CM | POA: Diagnosis not present

## 2022-12-28 LAB — BASIC METABOLIC PANEL
Anion gap: 4 — ABNORMAL LOW (ref 5–15)
Anion gap: 8 (ref 5–15)
BUN: 30 mg/dL — ABNORMAL HIGH (ref 6–20)
BUN: 31 mg/dL — ABNORMAL HIGH (ref 6–20)
CO2: 24 mmol/L (ref 22–32)
CO2: 20 mmol/L — ABNORMAL LOW (ref 22–32)
Calcium: 8.2 mg/dL — ABNORMAL LOW (ref 8.9–10.3)
Calcium: 8.8 mg/dL — ABNORMAL LOW (ref 8.9–10.3)
Chloride: 109 mmol/L (ref 98–111)
Chloride: 110 mmol/L (ref 98–111)
Creatinine, Ser: 1.93 mg/dL — ABNORMAL HIGH (ref 0.44–1.00)
Creatinine, Ser: 2.06 mg/dL — ABNORMAL HIGH (ref 0.44–1.00)
GFR, Estimated: 31 mL/min — ABNORMAL LOW (ref >60)
GFR, Estimated: 28 mL/min — ABNORMAL LOW (ref >60)
Glucose, Bld: 115 mg/dL — ABNORMAL HIGH (ref 70–99)
Glucose, Bld: 102 mg/dL — ABNORMAL HIGH (ref 70–99)
Potassium: 5.5 mmol/L — ABNORMAL HIGH (ref 3.5–5.1)
Potassium: 5.1 mmol/L (ref 3.5–5.1)
Sodium: 137 mmol/L (ref 135–145)
Sodium: 138 mmol/L (ref 135–145)

## 2022-12-28 LAB — GLUCOSE, CAPILLARY: Glucose-Capillary: 81 mg/dL (ref 70–99)

## 2022-12-28 LAB — ALBUMIN: Albumin: 3.7 g/dL (ref 3.5–5.0)

## 2022-12-28 LAB — COMPREHENSIVE METABOLIC PANEL
ALT: 20 U/L (ref 0–44)
AST: 18 U/L (ref 15–41)
Albumin: 3.4 g/dL — ABNORMAL LOW (ref 3.5–5.0)
Alkaline Phosphatase: 83 U/L (ref 38–126)
Anion gap: 6 (ref 5–15)
BUN: 31 mg/dL — ABNORMAL HIGH (ref 6–20)
CO2: 24 mmol/L (ref 22–32)
Calcium: 8.5 mg/dL — ABNORMAL LOW (ref 8.9–10.3)
Chloride: 107 mmol/L (ref 98–111)
Creatinine, Ser: 1.75 mg/dL — ABNORMAL HIGH (ref 0.44–1.00)
GFR, Estimated: 34 mL/min — ABNORMAL LOW (ref >60)
Glucose, Bld: 105 mg/dL — ABNORMAL HIGH (ref 70–99)
Potassium: 5.3 mmol/L — ABNORMAL HIGH (ref 3.5–5.1)
Sodium: 137 mmol/L (ref 135–145)
Total Bilirubin: 0.7 mg/dL (ref 0.3–1.2)
Total Protein: 5.7 g/dL — ABNORMAL LOW (ref 6.5–8.1)

## 2022-12-28 MED ORDER — ENOXAPARIN SODIUM 40 MG/0.4ML IJ SOSY
40.0000 mg | PREFILLED_SYRINGE | INTRAMUSCULAR | Status: DC
  Administered 2022-12-28: 40 mg via SUBCUTANEOUS
  Filled 2022-12-28 (×2): qty 0.4

## 2022-12-28 MED ORDER — BENZOCAINE 10 % MT GEL
Freq: Four times a day (QID) | OROMUCOSAL | Status: DC | PRN
  Filled 2022-12-28: qty 9

## 2022-12-28 MED ORDER — AMOXICILLIN-POT CLAVULANATE 875-125 MG PO TABS
1.0000 | ORAL_TABLET | Freq: Two times a day (BID) | ORAL | Status: DC
  Administered 2022-12-28 – 2022-12-29 (×3): 1 via ORAL
  Filled 2022-12-28 (×4): qty 1

## 2022-12-28 MED ORDER — PATIROMER SORBITEX CALCIUM 8.4 G PO PACK
16.8000 g | PACK | Freq: Once | ORAL | Status: AC
  Administered 2022-12-28: 16.8 g via ORAL
  Filled 2022-12-28: qty 2

## 2022-12-28 MED ORDER — SODIUM CHLORIDE 0.9 % IV BOLUS
500.0000 mL | Freq: Once | INTRAVENOUS | Status: AC
  Administered 2022-12-28: 500 mL via INTRAVENOUS

## 2022-12-28 MED ORDER — DEXTROSE 50 % IV SOLN
1.0000 | Freq: Once | INTRAVENOUS | Status: AC
  Administered 2022-12-28: 50 mL via INTRAVENOUS
  Filled 2022-12-28: qty 50

## 2022-12-28 MED ORDER — INSULIN ASPART 100 UNIT/ML IV SOLN
5.0000 [IU] | Freq: Once | INTRAVENOUS | Status: AC
  Administered 2022-12-28: 5 [IU] via INTRAVENOUS
  Filled 2022-12-28: qty 0.05

## 2022-12-28 MED ORDER — ENOXAPARIN SODIUM 30 MG/0.3ML IJ SOSY
30.0000 mg | PREFILLED_SYRINGE | INTRAMUSCULAR | Status: DC
Start: 2022-12-28 — End: 2022-12-28

## 2022-12-28 NOTE — Plan of Care (Signed)

## 2022-12-28 NOTE — Progress Notes (Signed)
PHARMACIST - PHYSICIAN COMMUNICATION  CONCERNING:  Enoxaparin (Lovenox) for DVT Prophylaxis    RECOMMENDATION: Patient was prescribed enoxaprin 40mg  q24 hours for VTE prophylaxis.   Filed Weights   12/24/22 1000  Weight: 81.2 kg (179 lb 0.2 oz)    Body mass index is 28.04 kg/m.  Estimated Creatinine Clearance: 36.9 mL/min (A) (by C-G formula based on SCr of 1.93 mg/dL (H)).   Based on [facility] policy patient is candidate for enoxaparin 40mg  every 24 hours based on CrCl >76ml/min or Weight >45kg  DESCRIPTION: Pharmacy has adjusted enoxaparin dose per [facility] policy.  Patient is now receiving enoxaparin 40 mg every 24 hours    Gemini Beaumier Rodriguez-Guzman PharmD, BCPS 12/28/2022 12:37 PM

## 2022-12-28 NOTE — Progress Notes (Signed)
Cardiac Enzymes: No results for input(s): "CKTOTAL", "CKMB", "CKMBINDEX", "TROPONINI" in the last 168 hours. BNP (last 3 results) No results for input(s): "PROBNP" in the last 8760 hours. HbA1C: No results for input(s): "HGBA1C" in the last 72 hours. CBG: No results for input(s): "GLUCAP" in the last 168 hours. Lipid Profile: No results for input(s): "CHOL", "HDL", "LDLCALC", "TRIG", "CHOLHDL", "LDLDIRECT" in the last 72 hours. Thyroid Function Tests: No results for input(s): "TSH", "T4TOTAL", "FREET4", "T3FREE", "THYROIDAB" in the last 72 hours. Anemia Panel: No results for input(s): "VITAMINB12", "FOLATE", "FERRITIN", "TIBC", "IRON", "RETICCTPCT" in the last 72 hours. Urine analysis:    Component Value Date/Time   COLORURINE STRAW (A) 12/26/2022 2306   APPEARANCEUR CLEAR (A) 12/26/2022 2306   LABSPEC 1.008 12/26/2022 2306   PHURINE 6.0 12/26/2022 2306   GLUCOSEU NEGATIVE 12/26/2022 2306   HGBUR NEGATIVE 12/26/2022 2306   BILIRUBINUR  NEGATIVE 12/26/2022 2306   BILIRUBINUR Negative 05/31/2022 1115   KETONESUR NEGATIVE 12/26/2022 2306   PROTEINUR NEGATIVE 12/26/2022 2306   UROBILINOGEN 0.2 05/31/2022 1115   UROBILINOGEN 0.2 02/12/2012 1541   NITRITE NEGATIVE 12/26/2022 2306   LEUKOCYTESUR NEGATIVE 12/26/2022 2306   Sepsis Labs: @LABRCNTIP (procalcitonin:4,lacticidven:4)  )No results found for this or any previous visit (from the past 240 hour(s)).       Radiology Studies: US RENAL ARTERY DUPLEX COMPLETE  Result Date: 12/27/2022 CLINICAL DATA:  [id number] AKI (acute kidney injury) (HCC) [id number] EXAM: RENAL/URINARY TRACT ULTRASOUND RENAL DUPLEX DOPPLER ULTRASOUND COMPARISON:  CT September 2024 FINDINGS: Right Kidney: Length: 10.7 cm. Echogenicity within normal limits. No mass or hydronephrosis visualized. Left Kidney: Length: 10.0 cm. Echogenicity within normal limits. No mass or hydronephrosis visualized. Bladder:  Unremarkable. RENAL DUPLEX ULTRASOUND Right Renal Artery Velocities: Origin:  180 cm/sec Mid:  80 cm/sec Hilum:  59 cm/sec Interlobar:  29 cm/sec Arcuate: 13 cm/sec; normal resistive indices measuring on average 0.5 Left Renal Artery Velocities: Origin:  100 cm/sec Mid:  154 cm/sec Hilum:  75 cm/sec Interlobar:  39 cm/sec Arcuate: 13 cm/sec; borderline low resistive indices measuring between 0.42 and 0.44 Aortic Velocity:  62 cm/sec Right Renal-Aortic Ratios: Origin: 2.9 Mid:  1.3 Hilum: 1.0 Interlobar: 0.4 Arcuate: 0.2 Left Renal-Aortic Ratios: Origin: 1.6 Mid: 2.5 Hilum: 1.2 Interlobar: 0.6 Arcuate: 0.2 IMPRESSION: 1. Arterial velocities near the upper limit of normal are noted at the origin of the right renal artery, which could suggest stenosis approaching 50-60%. 2. The patient has a known left renal artery stent. There are slightly elevated velocities noted in the mid left renal artery, which could suggest an element of early in-stent stenosis. This is somewhat further supported by borderline low resistive indices  measured in the arcuate arteries. These findings could be further evaluated with CT angiography if clinically appropriate. Electronically Signed   By: Olive Bass M.D.   On: 12/27/2022 14:49   ECHOCARDIOGRAM COMPLETE  Result Date: 12/27/2022    ECHOCARDIOGRAM REPORT   Patient Name:   Angelica Herrera Date of Exam: 12/27/2022 Medical Rec #:  [id number]           Height:       67.0 in Accession #:    [id number]          Weight:       179.0 lb Date of Birth:  Feb 14, 1971            BSA:          1.929 m Patient Age:    52 years  PROGRESS NOTE    Angelica Herrera  YQM:[id number] DOB: July 24, 1970 DOA: 12/24/2022 PCP: Danelle Berry, PA-C  Outpatient Specialists: cardiology, gi    Brief Narrative:   Angelica Herrera is a 52 y.o. female with medical history significant of alcoholic pancreatitis, CAD stenting, chronic HFrEF with LVEF 35-40%, renal artery stenosis status post stenting on left side, HTN, CKD stage IIIb, GERD, anxiety/depression, presented with worsening of abdominal pain, nauseous vomiting and diarrhea.   Patient has history of alcoholic pancreatitis but she continues to drink alcohol 3-4 times a week.  This time, her symptoms started 3 days ago with new onset of epigastric pain radiating to right RUQ, cramping-like intermittent, worsening with eating meals nonradiating, "I thought the pancreatitis has come back" associated with chills but no fever, same time she also developed frequent nauseous vomiting after eating meals and loose diarrhea.  She feels that overall her oral intake including solid food and liquids is decreased and her urine output also decreased as compared to her baseline.  Her PCP prescribed her with 2  "stomach medicine ", despite taking them she reports no improvement of any of her symptoms.  But denied any flank pain or back pain.  Denied any tenesmus.  She reported improvement of diarrhea overnight but continued to feel nauseous and vomiting when trying to eat.  Patient also reported that the abdominal pain she experienced a similar to the pain when she had her first pancreatitis but unlike the pain she experienced when she had MI.   Assessment & Plan:   Principal Problem:   NSTEMI (non-ST elevated myocardial infarction) (HCC) Active Problems:   Tobacco abuse   CKD (chronic kidney disease), stage IV (HCC)   GAD (generalized anxiety disorder)   Coronary artery disease involving native coronary artery   Accelerated hypertension   COPD with acute exacerbation (HCC)   Alcoholic  pancreatitis   Acute renal failure with acute tubular necrosis superimposed on stage 3b chronic kidney disease (HCC)   Demand ischemia   Elevated troponin   # Abdominal pain # Vomiting, diarrhea History of this patient at least several months. Epigastric and ruq. Hx of pancreatitis and lipase mildly elevated but no concerning findings on CT. Had EGD and colonoscopy in July significant only for gastritis. RUQ u/s this admission unremarkable as well. It's possible this is atypical chest pain (see below), but not likely. GI advised HIDA scan as next step so we obtained that, also negative. Pain waxes and wanes - f/u c diff and gi pathogen panel if diarrhea, none since arrival - ppi bid  # demand ischemia No chest pain but does have epigastric pain. Trops elevated to 200s, improved to 100s. With nonspecific TWIs. TTE stable. Treated initially w/ heparin no discontinued  # Toothache Chronic problem, today awoke with left lower jaw swelling and pain, no obvious abscess - will start augmentin and monitor  # AKI on ckd 3b # Hx RAS # Hyperkalemia Baseline cr appears to be in mid 1s, here 3s, saw cardiology recently and there was concern about over-diuresis. Does report vomiting recently and does not appear fluid up on exam (despite pulm edema read on CXR). Kidney function improving with fluids. Renal duplex mild abnormality, dr. Gilda Crease has reviewed, don't think findings are clinically significant, advises outpt f/u. Cr improved this morning but potassium up, challenged with 500 cc bolus and repeat cr actually a bit worse and potassium also up to 5.5. will give veltassa and insulin/glucose, continue to monitor. Will enlist nephrology  #  Cardiac Enzymes: No results for input(s): "CKTOTAL", "CKMB", "CKMBINDEX", "TROPONINI" in the last 168 hours. BNP (last 3 results) No results for input(s): "PROBNP" in the last 8760 hours. HbA1C: No results for input(s): "HGBA1C" in the last 72 hours. CBG: No results for input(s): "GLUCAP" in the last 168 hours. Lipid Profile: No results for input(s): "CHOL", "HDL", "LDLCALC", "TRIG", "CHOLHDL", "LDLDIRECT" in the last 72 hours. Thyroid Function Tests: No results for input(s): "TSH", "T4TOTAL", "FREET4", "T3FREE", "THYROIDAB" in the last 72 hours. Anemia Panel: No results for input(s): "VITAMINB12", "FOLATE", "FERRITIN", "TIBC", "IRON", "RETICCTPCT" in the last 72 hours. Urine analysis:    Component Value Date/Time   COLORURINE STRAW (A) 12/26/2022 2306   APPEARANCEUR CLEAR (A) 12/26/2022 2306   LABSPEC 1.008 12/26/2022 2306   PHURINE 6.0 12/26/2022 2306   GLUCOSEU NEGATIVE 12/26/2022 2306   HGBUR NEGATIVE 12/26/2022 2306   BILIRUBINUR  NEGATIVE 12/26/2022 2306   BILIRUBINUR Negative 05/31/2022 1115   KETONESUR NEGATIVE 12/26/2022 2306   PROTEINUR NEGATIVE 12/26/2022 2306   UROBILINOGEN 0.2 05/31/2022 1115   UROBILINOGEN 0.2 02/12/2012 1541   NITRITE NEGATIVE 12/26/2022 2306   LEUKOCYTESUR NEGATIVE 12/26/2022 2306   Sepsis Labs: @LABRCNTIP (procalcitonin:4,lacticidven:4)  )No results found for this or any previous visit (from the past 240 hour(s)).       Radiology Studies: US RENAL ARTERY DUPLEX COMPLETE  Result Date: 12/27/2022 CLINICAL DATA:  [id number] AKI (acute kidney injury) (HCC) [id number] EXAM: RENAL/URINARY TRACT ULTRASOUND RENAL DUPLEX DOPPLER ULTRASOUND COMPARISON:  CT September 2024 FINDINGS: Right Kidney: Length: 10.7 cm. Echogenicity within normal limits. No mass or hydronephrosis visualized. Left Kidney: Length: 10.0 cm. Echogenicity within normal limits. No mass or hydronephrosis visualized. Bladder:  Unremarkable. RENAL DUPLEX ULTRASOUND Right Renal Artery Velocities: Origin:  180 cm/sec Mid:  80 cm/sec Hilum:  59 cm/sec Interlobar:  29 cm/sec Arcuate: 13 cm/sec; normal resistive indices measuring on average 0.5 Left Renal Artery Velocities: Origin:  100 cm/sec Mid:  154 cm/sec Hilum:  75 cm/sec Interlobar:  39 cm/sec Arcuate: 13 cm/sec; borderline low resistive indices measuring between 0.42 and 0.44 Aortic Velocity:  62 cm/sec Right Renal-Aortic Ratios: Origin: 2.9 Mid:  1.3 Hilum: 1.0 Interlobar: 0.4 Arcuate: 0.2 Left Renal-Aortic Ratios: Origin: 1.6 Mid: 2.5 Hilum: 1.2 Interlobar: 0.6 Arcuate: 0.2 IMPRESSION: 1. Arterial velocities near the upper limit of normal are noted at the origin of the right renal artery, which could suggest stenosis approaching 50-60%. 2. The patient has a known left renal artery stent. There are slightly elevated velocities noted in the mid left renal artery, which could suggest an element of early in-stent stenosis. This is somewhat further supported by borderline low resistive indices  measured in the arcuate arteries. These findings could be further evaluated with CT angiography if clinically appropriate. Electronically Signed   By: Olive Bass M.D.   On: 12/27/2022 14:49   ECHOCARDIOGRAM COMPLETE  Result Date: 12/27/2022    ECHOCARDIOGRAM REPORT   Patient Name:   Angelica Herrera Date of Exam: 12/27/2022 Medical Rec #:  [id number]           Height:       67.0 in Accession #:    [id number]          Weight:       179.0 lb Date of Birth:  Feb 14, 1971            BSA:          1.929 m Patient Age:    52 years  PROGRESS NOTE    Angelica Herrera  YQM:[id number] DOB: July 24, 1970 DOA: 12/24/2022 PCP: Danelle Berry, PA-C  Outpatient Specialists: cardiology, gi    Brief Narrative:   Angelica Herrera is a 52 y.o. female with medical history significant of alcoholic pancreatitis, CAD stenting, chronic HFrEF with LVEF 35-40%, renal artery stenosis status post stenting on left side, HTN, CKD stage IIIb, GERD, anxiety/depression, presented with worsening of abdominal pain, nauseous vomiting and diarrhea.   Patient has history of alcoholic pancreatitis but she continues to drink alcohol 3-4 times a week.  This time, her symptoms started 3 days ago with new onset of epigastric pain radiating to right RUQ, cramping-like intermittent, worsening with eating meals nonradiating, "I thought the pancreatitis has come back" associated with chills but no fever, same time she also developed frequent nauseous vomiting after eating meals and loose diarrhea.  She feels that overall her oral intake including solid food and liquids is decreased and her urine output also decreased as compared to her baseline.  Her PCP prescribed her with 2  "stomach medicine ", despite taking them she reports no improvement of any of her symptoms.  But denied any flank pain or back pain.  Denied any tenesmus.  She reported improvement of diarrhea overnight but continued to feel nauseous and vomiting when trying to eat.  Patient also reported that the abdominal pain she experienced a similar to the pain when she had her first pancreatitis but unlike the pain she experienced when she had MI.   Assessment & Plan:   Principal Problem:   NSTEMI (non-ST elevated myocardial infarction) (HCC) Active Problems:   Tobacco abuse   CKD (chronic kidney disease), stage IV (HCC)   GAD (generalized anxiety disorder)   Coronary artery disease involving native coronary artery   Accelerated hypertension   COPD with acute exacerbation (HCC)   Alcoholic  pancreatitis   Acute renal failure with acute tubular necrosis superimposed on stage 3b chronic kidney disease (HCC)   Demand ischemia   Elevated troponin   # Abdominal pain # Vomiting, diarrhea History of this patient at least several months. Epigastric and ruq. Hx of pancreatitis and lipase mildly elevated but no concerning findings on CT. Had EGD and colonoscopy in July significant only for gastritis. RUQ u/s this admission unremarkable as well. It's possible this is atypical chest pain (see below), but not likely. GI advised HIDA scan as next step so we obtained that, also negative. Pain waxes and wanes - f/u c diff and gi pathogen panel if diarrhea, none since arrival - ppi bid  # demand ischemia No chest pain but does have epigastric pain. Trops elevated to 200s, improved to 100s. With nonspecific TWIs. TTE stable. Treated initially w/ heparin no discontinued  # Toothache Chronic problem, today awoke with left lower jaw swelling and pain, no obvious abscess - will start augmentin and monitor  # AKI on ckd 3b # Hx RAS # Hyperkalemia Baseline cr appears to be in mid 1s, here 3s, saw cardiology recently and there was concern about over-diuresis. Does report vomiting recently and does not appear fluid up on exam (despite pulm edema read on CXR). Kidney function improving with fluids. Renal duplex mild abnormality, dr. Gilda Crease has reviewed, don't think findings are clinically significant, advises outpt f/u. Cr improved this morning but potassium up, challenged with 500 cc bolus and repeat cr actually a bit worse and potassium also up to 5.5. will give veltassa and insulin/glucose, continue to monitor. Will enlist nephrology  #  PROGRESS NOTE    Angelica Herrera  YQM:[id number] DOB: July 24, 1970 DOA: 12/24/2022 PCP: Danelle Berry, PA-C  Outpatient Specialists: cardiology, gi    Brief Narrative:   Angelica Herrera is a 52 y.o. female with medical history significant of alcoholic pancreatitis, CAD stenting, chronic HFrEF with LVEF 35-40%, renal artery stenosis status post stenting on left side, HTN, CKD stage IIIb, GERD, anxiety/depression, presented with worsening of abdominal pain, nauseous vomiting and diarrhea.   Patient has history of alcoholic pancreatitis but she continues to drink alcohol 3-4 times a week.  This time, her symptoms started 3 days ago with new onset of epigastric pain radiating to right RUQ, cramping-like intermittent, worsening with eating meals nonradiating, "I thought the pancreatitis has come back" associated with chills but no fever, same time she also developed frequent nauseous vomiting after eating meals and loose diarrhea.  She feels that overall her oral intake including solid food and liquids is decreased and her urine output also decreased as compared to her baseline.  Her PCP prescribed her with 2  "stomach medicine ", despite taking them she reports no improvement of any of her symptoms.  But denied any flank pain or back pain.  Denied any tenesmus.  She reported improvement of diarrhea overnight but continued to feel nauseous and vomiting when trying to eat.  Patient also reported that the abdominal pain she experienced a similar to the pain when she had her first pancreatitis but unlike the pain she experienced when she had MI.   Assessment & Plan:   Principal Problem:   NSTEMI (non-ST elevated myocardial infarction) (HCC) Active Problems:   Tobacco abuse   CKD (chronic kidney disease), stage IV (HCC)   GAD (generalized anxiety disorder)   Coronary artery disease involving native coronary artery   Accelerated hypertension   COPD with acute exacerbation (HCC)   Alcoholic  pancreatitis   Acute renal failure with acute tubular necrosis superimposed on stage 3b chronic kidney disease (HCC)   Demand ischemia   Elevated troponin   # Abdominal pain # Vomiting, diarrhea History of this patient at least several months. Epigastric and ruq. Hx of pancreatitis and lipase mildly elevated but no concerning findings on CT. Had EGD and colonoscopy in July significant only for gastritis. RUQ u/s this admission unremarkable as well. It's possible this is atypical chest pain (see below), but not likely. GI advised HIDA scan as next step so we obtained that, also negative. Pain waxes and wanes - f/u c diff and gi pathogen panel if diarrhea, none since arrival - ppi bid  # demand ischemia No chest pain but does have epigastric pain. Trops elevated to 200s, improved to 100s. With nonspecific TWIs. TTE stable. Treated initially w/ heparin no discontinued  # Toothache Chronic problem, today awoke with left lower jaw swelling and pain, no obvious abscess - will start augmentin and monitor  # AKI on ckd 3b # Hx RAS # Hyperkalemia Baseline cr appears to be in mid 1s, here 3s, saw cardiology recently and there was concern about over-diuresis. Does report vomiting recently and does not appear fluid up on exam (despite pulm edema read on CXR). Kidney function improving with fluids. Renal duplex mild abnormality, dr. Gilda Crease has reviewed, don't think findings are clinically significant, advises outpt f/u. Cr improved this morning but potassium up, challenged with 500 cc bolus and repeat cr actually a bit worse and potassium also up to 5.5. will give veltassa and insulin/glucose, continue to monitor. Will enlist nephrology  #  Cardiac Enzymes: No results for input(s): "CKTOTAL", "CKMB", "CKMBINDEX", "TROPONINI" in the last 168 hours. BNP (last 3 results) No results for input(s): "PROBNP" in the last 8760 hours. HbA1C: No results for input(s): "HGBA1C" in the last 72 hours. CBG: No results for input(s): "GLUCAP" in the last 168 hours. Lipid Profile: No results for input(s): "CHOL", "HDL", "LDLCALC", "TRIG", "CHOLHDL", "LDLDIRECT" in the last 72 hours. Thyroid Function Tests: No results for input(s): "TSH", "T4TOTAL", "FREET4", "T3FREE", "THYROIDAB" in the last 72 hours. Anemia Panel: No results for input(s): "VITAMINB12", "FOLATE", "FERRITIN", "TIBC", "IRON", "RETICCTPCT" in the last 72 hours. Urine analysis:    Component Value Date/Time   COLORURINE STRAW (A) 12/26/2022 2306   APPEARANCEUR CLEAR (A) 12/26/2022 2306   LABSPEC 1.008 12/26/2022 2306   PHURINE 6.0 12/26/2022 2306   GLUCOSEU NEGATIVE 12/26/2022 2306   HGBUR NEGATIVE 12/26/2022 2306   BILIRUBINUR  NEGATIVE 12/26/2022 2306   BILIRUBINUR Negative 05/31/2022 1115   KETONESUR NEGATIVE 12/26/2022 2306   PROTEINUR NEGATIVE 12/26/2022 2306   UROBILINOGEN 0.2 05/31/2022 1115   UROBILINOGEN 0.2 02/12/2012 1541   NITRITE NEGATIVE 12/26/2022 2306   LEUKOCYTESUR NEGATIVE 12/26/2022 2306   Sepsis Labs: @LABRCNTIP (procalcitonin:4,lacticidven:4)  )No results found for this or any previous visit (from the past 240 hour(s)).       Radiology Studies: US RENAL ARTERY DUPLEX COMPLETE  Result Date: 12/27/2022 CLINICAL DATA:  [id number] AKI (acute kidney injury) (HCC) [id number] EXAM: RENAL/URINARY TRACT ULTRASOUND RENAL DUPLEX DOPPLER ULTRASOUND COMPARISON:  CT September 2024 FINDINGS: Right Kidney: Length: 10.7 cm. Echogenicity within normal limits. No mass or hydronephrosis visualized. Left Kidney: Length: 10.0 cm. Echogenicity within normal limits. No mass or hydronephrosis visualized. Bladder:  Unremarkable. RENAL DUPLEX ULTRASOUND Right Renal Artery Velocities: Origin:  180 cm/sec Mid:  80 cm/sec Hilum:  59 cm/sec Interlobar:  29 cm/sec Arcuate: 13 cm/sec; normal resistive indices measuring on average 0.5 Left Renal Artery Velocities: Origin:  100 cm/sec Mid:  154 cm/sec Hilum:  75 cm/sec Interlobar:  39 cm/sec Arcuate: 13 cm/sec; borderline low resistive indices measuring between 0.42 and 0.44 Aortic Velocity:  62 cm/sec Right Renal-Aortic Ratios: Origin: 2.9 Mid:  1.3 Hilum: 1.0 Interlobar: 0.4 Arcuate: 0.2 Left Renal-Aortic Ratios: Origin: 1.6 Mid: 2.5 Hilum: 1.2 Interlobar: 0.6 Arcuate: 0.2 IMPRESSION: 1. Arterial velocities near the upper limit of normal are noted at the origin of the right renal artery, which could suggest stenosis approaching 50-60%. 2. The patient has a known left renal artery stent. There are slightly elevated velocities noted in the mid left renal artery, which could suggest an element of early in-stent stenosis. This is somewhat further supported by borderline low resistive indices  measured in the arcuate arteries. These findings could be further evaluated with CT angiography if clinically appropriate. Electronically Signed   By: Olive Bass M.D.   On: 12/27/2022 14:49   ECHOCARDIOGRAM COMPLETE  Result Date: 12/27/2022    ECHOCARDIOGRAM REPORT   Patient Name:   Angelica Herrera Date of Exam: 12/27/2022 Medical Rec #:  [id number]           Height:       67.0 in Accession #:    [id number]          Weight:       179.0 lb Date of Birth:  Feb 14, 1971            BSA:          1.929 m Patient Age:    52 years  Cardiac Enzymes: No results for input(s): "CKTOTAL", "CKMB", "CKMBINDEX", "TROPONINI" in the last 168 hours. BNP (last 3 results) No results for input(s): "PROBNP" in the last 8760 hours. HbA1C: No results for input(s): "HGBA1C" in the last 72 hours. CBG: No results for input(s): "GLUCAP" in the last 168 hours. Lipid Profile: No results for input(s): "CHOL", "HDL", "LDLCALC", "TRIG", "CHOLHDL", "LDLDIRECT" in the last 72 hours. Thyroid Function Tests: No results for input(s): "TSH", "T4TOTAL", "FREET4", "T3FREE", "THYROIDAB" in the last 72 hours. Anemia Panel: No results for input(s): "VITAMINB12", "FOLATE", "FERRITIN", "TIBC", "IRON", "RETICCTPCT" in the last 72 hours. Urine analysis:    Component Value Date/Time   COLORURINE STRAW (A) 12/26/2022 2306   APPEARANCEUR CLEAR (A) 12/26/2022 2306   LABSPEC 1.008 12/26/2022 2306   PHURINE 6.0 12/26/2022 2306   GLUCOSEU NEGATIVE 12/26/2022 2306   HGBUR NEGATIVE 12/26/2022 2306   BILIRUBINUR  NEGATIVE 12/26/2022 2306   BILIRUBINUR Negative 05/31/2022 1115   KETONESUR NEGATIVE 12/26/2022 2306   PROTEINUR NEGATIVE 12/26/2022 2306   UROBILINOGEN 0.2 05/31/2022 1115   UROBILINOGEN 0.2 02/12/2012 1541   NITRITE NEGATIVE 12/26/2022 2306   LEUKOCYTESUR NEGATIVE 12/26/2022 2306   Sepsis Labs: @LABRCNTIP (procalcitonin:4,lacticidven:4)  )No results found for this or any previous visit (from the past 240 hour(s)).       Radiology Studies: US RENAL ARTERY DUPLEX COMPLETE  Result Date: 12/27/2022 CLINICAL DATA:  [id number] AKI (acute kidney injury) (HCC) [id number] EXAM: RENAL/URINARY TRACT ULTRASOUND RENAL DUPLEX DOPPLER ULTRASOUND COMPARISON:  CT September 2024 FINDINGS: Right Kidney: Length: 10.7 cm. Echogenicity within normal limits. No mass or hydronephrosis visualized. Left Kidney: Length: 10.0 cm. Echogenicity within normal limits. No mass or hydronephrosis visualized. Bladder:  Unremarkable. RENAL DUPLEX ULTRASOUND Right Renal Artery Velocities: Origin:  180 cm/sec Mid:  80 cm/sec Hilum:  59 cm/sec Interlobar:  29 cm/sec Arcuate: 13 cm/sec; normal resistive indices measuring on average 0.5 Left Renal Artery Velocities: Origin:  100 cm/sec Mid:  154 cm/sec Hilum:  75 cm/sec Interlobar:  39 cm/sec Arcuate: 13 cm/sec; borderline low resistive indices measuring between 0.42 and 0.44 Aortic Velocity:  62 cm/sec Right Renal-Aortic Ratios: Origin: 2.9 Mid:  1.3 Hilum: 1.0 Interlobar: 0.4 Arcuate: 0.2 Left Renal-Aortic Ratios: Origin: 1.6 Mid: 2.5 Hilum: 1.2 Interlobar: 0.6 Arcuate: 0.2 IMPRESSION: 1. Arterial velocities near the upper limit of normal are noted at the origin of the right renal artery, which could suggest stenosis approaching 50-60%. 2. The patient has a known left renal artery stent. There are slightly elevated velocities noted in the mid left renal artery, which could suggest an element of early in-stent stenosis. This is somewhat further supported by borderline low resistive indices  measured in the arcuate arteries. These findings could be further evaluated with CT angiography if clinically appropriate. Electronically Signed   By: Olive Bass M.D.   On: 12/27/2022 14:49   ECHOCARDIOGRAM COMPLETE  Result Date: 12/27/2022    ECHOCARDIOGRAM REPORT   Patient Name:   Angelica Herrera Date of Exam: 12/27/2022 Medical Rec #:  [id number]           Height:       67.0 in Accession #:    [id number]          Weight:       179.0 lb Date of Birth:  Feb 14, 1971            BSA:          1.929 m Patient Age:    52 years

## 2022-12-28 NOTE — Consult Note (Signed)
cefdinir, cefpodoxime, cefprozil, cefuroxime, cephalexin and loracarbef.     CEFTAZIDIME <=1 Sensitive     CEFEPIME <=1 Sensitive     CEFTRIAXONE <=1 Sensitive     CIPROFLOXACIN <=0.25 Sensitive     LEVOFLOXACIN <=0.12 Sensitive     GENTAMICIN <=1 Sensitive     IMIPENEM <=0.25 Sensitive     NITROFURANTOIN <=16 Sensitive     PIP/TAZO <=4 Sensitive     TOBRAMYCIN <=1 Sensitive     TRIMETH/SULFA* <=20 Sensitive      * For uncomplicated UTI caused by E. coli, K. pneumoniae or P. mirabilis: Cefazolin  is susceptible if MIC <32 mcg/mL and predicts susceptible to the oral agents cefaclor, cefdinir, cefpodoxime, cefprozil, cefuroxime, cephalexin and loracarbef. Legend: S = Susceptible  I = Intermediate R = Resistant  NS = Not susceptible * = Not tested  NR = Not reported **NN = See antimicrobic comments     Coagulation Studies: No results for input(s): "LABPROT", "INR" in the last 72 hours.  Urinalysis: Recent Labs    12/26/22 2306  COLORURINE STRAW*  LABSPEC 1.008  PHURINE 6.0  GLUCOSEU NEGATIVE  HGBUR NEGATIVE  BILIRUBINUR NEGATIVE  KETONESUR NEGATIVE  PROTEINUR NEGATIVE  NITRITE NEGATIVE  LEUKOCYTESUR NEGATIVE      Imaging: US RENAL ARTERY DUPLEX COMPLETE  Result Date: 12/27/2022 CLINICAL DATA:  [id number] AKI (acute kidney injury) (HCC) [id number] EXAM: RENAL/URINARY TRACT ULTRASOUND RENAL DUPLEX DOPPLER ULTRASOUND COMPARISON:  CT September 2024 FINDINGS: Right Kidney: Length: 10.7 cm. Echogenicity within normal limits. No mass or hydronephrosis visualized. Left Kidney: Length: 10.0 cm. Echogenicity within normal limits. No mass or hydronephrosis visualized. Bladder:  Unremarkable. RENAL DUPLEX ULTRASOUND Right Renal Artery Velocities: Origin:  180 cm/sec Mid:  80 cm/sec Hilum:  59 cm/sec Interlobar:  29 cm/sec Arcuate: 13 cm/sec; normal resistive indices measuring on average 0.5 Left Renal Artery Velocities: Origin:  100 cm/sec Mid:  154 cm/sec Hilum:  75 cm/sec Interlobar:  39 cm/sec Arcuate: 13 cm/sec; borderline low resistive indices measuring between 0.42 and 0.44 Aortic Velocity:  62 cm/sec Right Renal-Aortic Ratios: Origin: 2.9 Mid:  1.3 Hilum: 1.0 Interlobar: 0.4 Arcuate: 0.2 Left Renal-Aortic Ratios: Origin: 1.6 Mid: 2.5 Hilum: 1.2 Interlobar: 0.6 Arcuate: 0.2 IMPRESSION: 1. Arterial velocities near the upper limit of normal are noted at the origin of the right renal artery, which could suggest stenosis approaching 50-60%. 2. The patient has a known left renal artery stent.  There are slightly elevated velocities noted in the mid left renal artery, which could suggest an element of early in-stent stenosis. This is somewhat further supported by borderline low resistive indices measured in the arcuate arteries. These findings could be further evaluated with CT angiography if clinically appropriate. Electronically Signed   By: Olive Bass M.D.   On: 12/27/2022 14:49   ECHOCARDIOGRAM COMPLETE  Result Date: 12/27/2022    ECHOCARDIOGRAM REPORT   Patient Name:   Angelica Herrera Date of Exam: 12/27/2022 Medical Rec #:  [id number]           Height:       67.0 in Accession #:    [id number]          Weight:       179.0 lb Date of Birth:  August 05, 1970            BSA:          1.929 m Patient Age:    52 years            BP:  cefdinir, cefpodoxime, cefprozil, cefuroxime, cephalexin and loracarbef.     CEFTAZIDIME <=1 Sensitive     CEFEPIME <=1 Sensitive     CEFTRIAXONE <=1 Sensitive     CIPROFLOXACIN <=0.25 Sensitive     LEVOFLOXACIN <=0.12 Sensitive     GENTAMICIN <=1 Sensitive     IMIPENEM <=0.25 Sensitive     NITROFURANTOIN <=16 Sensitive     PIP/TAZO <=4 Sensitive     TOBRAMYCIN <=1 Sensitive     TRIMETH/SULFA* <=20 Sensitive      * For uncomplicated UTI caused by E. coli, K. pneumoniae or P. mirabilis: Cefazolin  is susceptible if MIC <32 mcg/mL and predicts susceptible to the oral agents cefaclor, cefdinir, cefpodoxime, cefprozil, cefuroxime, cephalexin and loracarbef. Legend: S = Susceptible  I = Intermediate R = Resistant  NS = Not susceptible * = Not tested  NR = Not reported **NN = See antimicrobic comments     Coagulation Studies: No results for input(s): "LABPROT", "INR" in the last 72 hours.  Urinalysis: Recent Labs    12/26/22 2306  COLORURINE STRAW*  LABSPEC 1.008  PHURINE 6.0  GLUCOSEU NEGATIVE  HGBUR NEGATIVE  BILIRUBINUR NEGATIVE  KETONESUR NEGATIVE  PROTEINUR NEGATIVE  NITRITE NEGATIVE  LEUKOCYTESUR NEGATIVE      Imaging: US RENAL ARTERY DUPLEX COMPLETE  Result Date: 12/27/2022 CLINICAL DATA:  [id number] AKI (acute kidney injury) (HCC) [id number] EXAM: RENAL/URINARY TRACT ULTRASOUND RENAL DUPLEX DOPPLER ULTRASOUND COMPARISON:  CT September 2024 FINDINGS: Right Kidney: Length: 10.7 cm. Echogenicity within normal limits. No mass or hydronephrosis visualized. Left Kidney: Length: 10.0 cm. Echogenicity within normal limits. No mass or hydronephrosis visualized. Bladder:  Unremarkable. RENAL DUPLEX ULTRASOUND Right Renal Artery Velocities: Origin:  180 cm/sec Mid:  80 cm/sec Hilum:  59 cm/sec Interlobar:  29 cm/sec Arcuate: 13 cm/sec; normal resistive indices measuring on average 0.5 Left Renal Artery Velocities: Origin:  100 cm/sec Mid:  154 cm/sec Hilum:  75 cm/sec Interlobar:  39 cm/sec Arcuate: 13 cm/sec; borderline low resistive indices measuring between 0.42 and 0.44 Aortic Velocity:  62 cm/sec Right Renal-Aortic Ratios: Origin: 2.9 Mid:  1.3 Hilum: 1.0 Interlobar: 0.4 Arcuate: 0.2 Left Renal-Aortic Ratios: Origin: 1.6 Mid: 2.5 Hilum: 1.2 Interlobar: 0.6 Arcuate: 0.2 IMPRESSION: 1. Arterial velocities near the upper limit of normal are noted at the origin of the right renal artery, which could suggest stenosis approaching 50-60%. 2. The patient has a known left renal artery stent.  There are slightly elevated velocities noted in the mid left renal artery, which could suggest an element of early in-stent stenosis. This is somewhat further supported by borderline low resistive indices measured in the arcuate arteries. These findings could be further evaluated with CT angiography if clinically appropriate. Electronically Signed   By: Olive Bass M.D.   On: 12/27/2022 14:49   ECHOCARDIOGRAM COMPLETE  Result Date: 12/27/2022    ECHOCARDIOGRAM REPORT   Patient Name:   Angelica Herrera Date of Exam: 12/27/2022 Medical Rec #:  [id number]           Height:       67.0 in Accession #:    [id number]          Weight:       179.0 lb Date of Birth:  August 05, 1970            BSA:          1.929 m Patient Age:    52 years            BP:  cefdinir, cefpodoxime, cefprozil, cefuroxime, cephalexin and loracarbef.     CEFTAZIDIME <=1 Sensitive     CEFEPIME <=1 Sensitive     CEFTRIAXONE <=1 Sensitive     CIPROFLOXACIN <=0.25 Sensitive     LEVOFLOXACIN <=0.12 Sensitive     GENTAMICIN <=1 Sensitive     IMIPENEM <=0.25 Sensitive     NITROFURANTOIN <=16 Sensitive     PIP/TAZO <=4 Sensitive     TOBRAMYCIN <=1 Sensitive     TRIMETH/SULFA* <=20 Sensitive      * For uncomplicated UTI caused by E. coli, K. pneumoniae or P. mirabilis: Cefazolin  is susceptible if MIC <32 mcg/mL and predicts susceptible to the oral agents cefaclor, cefdinir, cefpodoxime, cefprozil, cefuroxime, cephalexin and loracarbef. Legend: S = Susceptible  I = Intermediate R = Resistant  NS = Not susceptible * = Not tested  NR = Not reported **NN = See antimicrobic comments     Coagulation Studies: No results for input(s): "LABPROT", "INR" in the last 72 hours.  Urinalysis: Recent Labs    12/26/22 2306  COLORURINE STRAW*  LABSPEC 1.008  PHURINE 6.0  GLUCOSEU NEGATIVE  HGBUR NEGATIVE  BILIRUBINUR NEGATIVE  KETONESUR NEGATIVE  PROTEINUR NEGATIVE  NITRITE NEGATIVE  LEUKOCYTESUR NEGATIVE      Imaging: US RENAL ARTERY DUPLEX COMPLETE  Result Date: 12/27/2022 CLINICAL DATA:  [id number] AKI (acute kidney injury) (HCC) [id number] EXAM: RENAL/URINARY TRACT ULTRASOUND RENAL DUPLEX DOPPLER ULTRASOUND COMPARISON:  CT September 2024 FINDINGS: Right Kidney: Length: 10.7 cm. Echogenicity within normal limits. No mass or hydronephrosis visualized. Left Kidney: Length: 10.0 cm. Echogenicity within normal limits. No mass or hydronephrosis visualized. Bladder:  Unremarkable. RENAL DUPLEX ULTRASOUND Right Renal Artery Velocities: Origin:  180 cm/sec Mid:  80 cm/sec Hilum:  59 cm/sec Interlobar:  29 cm/sec Arcuate: 13 cm/sec; normal resistive indices measuring on average 0.5 Left Renal Artery Velocities: Origin:  100 cm/sec Mid:  154 cm/sec Hilum:  75 cm/sec Interlobar:  39 cm/sec Arcuate: 13 cm/sec; borderline low resistive indices measuring between 0.42 and 0.44 Aortic Velocity:  62 cm/sec Right Renal-Aortic Ratios: Origin: 2.9 Mid:  1.3 Hilum: 1.0 Interlobar: 0.4 Arcuate: 0.2 Left Renal-Aortic Ratios: Origin: 1.6 Mid: 2.5 Hilum: 1.2 Interlobar: 0.6 Arcuate: 0.2 IMPRESSION: 1. Arterial velocities near the upper limit of normal are noted at the origin of the right renal artery, which could suggest stenosis approaching 50-60%. 2. The patient has a known left renal artery stent.  There are slightly elevated velocities noted in the mid left renal artery, which could suggest an element of early in-stent stenosis. This is somewhat further supported by borderline low resistive indices measured in the arcuate arteries. These findings could be further evaluated with CT angiography if clinically appropriate. Electronically Signed   By: Olive Bass M.D.   On: 12/27/2022 14:49   ECHOCARDIOGRAM COMPLETE  Result Date: 12/27/2022    ECHOCARDIOGRAM REPORT   Patient Name:   Angelica Herrera Date of Exam: 12/27/2022 Medical Rec #:  [id number]           Height:       67.0 in Accession #:    [id number]          Weight:       179.0 lb Date of Birth:  August 05, 1970            BSA:          1.929 m Patient Age:    52 years            BP:  cefdinir, cefpodoxime, cefprozil, cefuroxime, cephalexin and loracarbef.     CEFTAZIDIME <=1 Sensitive     CEFEPIME <=1 Sensitive     CEFTRIAXONE <=1 Sensitive     CIPROFLOXACIN <=0.25 Sensitive     LEVOFLOXACIN <=0.12 Sensitive     GENTAMICIN <=1 Sensitive     IMIPENEM <=0.25 Sensitive     NITROFURANTOIN <=16 Sensitive     PIP/TAZO <=4 Sensitive     TOBRAMYCIN <=1 Sensitive     TRIMETH/SULFA* <=20 Sensitive      * For uncomplicated UTI caused by E. coli, K. pneumoniae or P. mirabilis: Cefazolin  is susceptible if MIC <32 mcg/mL and predicts susceptible to the oral agents cefaclor, cefdinir, cefpodoxime, cefprozil, cefuroxime, cephalexin and loracarbef. Legend: S = Susceptible  I = Intermediate R = Resistant  NS = Not susceptible * = Not tested  NR = Not reported **NN = See antimicrobic comments     Coagulation Studies: No results for input(s): "LABPROT", "INR" in the last 72 hours.  Urinalysis: Recent Labs    12/26/22 2306  COLORURINE STRAW*  LABSPEC 1.008  PHURINE 6.0  GLUCOSEU NEGATIVE  HGBUR NEGATIVE  BILIRUBINUR NEGATIVE  KETONESUR NEGATIVE  PROTEINUR NEGATIVE  NITRITE NEGATIVE  LEUKOCYTESUR NEGATIVE      Imaging: US RENAL ARTERY DUPLEX COMPLETE  Result Date: 12/27/2022 CLINICAL DATA:  [id number] AKI (acute kidney injury) (HCC) [id number] EXAM: RENAL/URINARY TRACT ULTRASOUND RENAL DUPLEX DOPPLER ULTRASOUND COMPARISON:  CT September 2024 FINDINGS: Right Kidney: Length: 10.7 cm. Echogenicity within normal limits. No mass or hydronephrosis visualized. Left Kidney: Length: 10.0 cm. Echogenicity within normal limits. No mass or hydronephrosis visualized. Bladder:  Unremarkable. RENAL DUPLEX ULTRASOUND Right Renal Artery Velocities: Origin:  180 cm/sec Mid:  80 cm/sec Hilum:  59 cm/sec Interlobar:  29 cm/sec Arcuate: 13 cm/sec; normal resistive indices measuring on average 0.5 Left Renal Artery Velocities: Origin:  100 cm/sec Mid:  154 cm/sec Hilum:  75 cm/sec Interlobar:  39 cm/sec Arcuate: 13 cm/sec; borderline low resistive indices measuring between 0.42 and 0.44 Aortic Velocity:  62 cm/sec Right Renal-Aortic Ratios: Origin: 2.9 Mid:  1.3 Hilum: 1.0 Interlobar: 0.4 Arcuate: 0.2 Left Renal-Aortic Ratios: Origin: 1.6 Mid: 2.5 Hilum: 1.2 Interlobar: 0.6 Arcuate: 0.2 IMPRESSION: 1. Arterial velocities near the upper limit of normal are noted at the origin of the right renal artery, which could suggest stenosis approaching 50-60%. 2. The patient has a known left renal artery stent.  There are slightly elevated velocities noted in the mid left renal artery, which could suggest an element of early in-stent stenosis. This is somewhat further supported by borderline low resistive indices measured in the arcuate arteries. These findings could be further evaluated with CT angiography if clinically appropriate. Electronically Signed   By: Olive Bass M.D.   On: 12/27/2022 14:49   ECHOCARDIOGRAM COMPLETE  Result Date: 12/27/2022    ECHOCARDIOGRAM REPORT   Patient Name:   Angelica Herrera Date of Exam: 12/27/2022 Medical Rec #:  [id number]           Height:       67.0 in Accession #:    [id number]          Weight:       179.0 lb Date of Birth:  August 05, 1970            BSA:          1.929 m Patient Age:    52 years            BP:  [facility]  CONSULT NOTE    Date: 12/28/2022                  Patient Name:  Angelica Herrera  MRN: [id number]  DOB: April 30, 1970  Age / Sex: 52 y.o., female         PCP: Danelle Berry, PA-C                 Service Requesting Consult: Dr. Ashok Pall                 Reason for Consult: Acute Kidney Injury            History of Present Illness: Ms. Angelica Herrera admitted with epigastric abodminal pain. Found to have NSTEMI and acute pancreatitis from alcohol. Placed on IVF.   Creatinine on admission of 3.63. Today, creatinine has trended down to 1.93 with potassium of 5.5.   Placed on PO antibiotics.      Medications: Outpatient medications: Facility-Administered Medications Prior to Admission  Medication Dose Route Frequency Provider Last Rate Last Admin   mometasone-formoterol (DULERA) 200-5 MCG/ACT inhaler 2 puff  2 puff Inhalation BID Danelle Berry, PA-C       Medications Prior to Admission  Medication Sig Dispense Refill Last Dose   acetaminophen (TYLENOL) 500 MG tablet Take 1,000 mg by mouth every 6 (six) hours as needed.   prn at unknown   aspirin 81 MG tablet Take 1 tablet (81 mg total) by mouth daily. 30 tablet  Past Month   atorvastatin (LIPITOR) 80 MG tablet Take 1 tablet (80 mg total) by mouth at bedtime. 90 tablet 2 Past Week   busPIRone (BUSPAR) 5 MG tablet TAKE 1 TO 3 TABLETS BY MOUTH TWICE DAILY AS NEEDED 120 tablet 0 Past Week   carvedilol (COREG) 25 MG tablet TAKE (1) TABLET BY MOUTH TWICE DAILY (Patient taking differently: Take 25 mg by mouth 2 (two) times daily with a meal.) 60 tablet 0 Past Week   cetirizine (ZYRTEC) 10 MG tablet TAKE (1) TABLET BY MOUTH  AT BEDTIME 30 tablet 6 Past Week   clopidogrel (PLAVIX) 75 MG tablet TAKE (1) TABLET BY MOUTH EVERY DAY 90 tablet 1 Past Week   colchicine 0.6 MG tablet Take 1 tablet (0.6 mg total) by mouth daily as needed (gout). 15 tablet 0 prn at unknown   famotidine (PEPCID) 20 MG tablet Take  1 tablet (20 mg total) by mouth 2 (two) times daily as needed for heartburn or indigestion. 60 tablet 2 prn at unknown   gabapentin (NEURONTIN) 300 MG capsule Take 1 capsule (300 mg total) by mouth 2 (two) times daily. 180 capsule 1 prn at unknown   levothyroxine (SYNTHROID) 125 MCG tablet TAKE (1) TABLET BY MOUTH EVERY MORNING BEFORE BREAKFAST 90 tablet 1 Past Week   nitroGLYCERIN (NITROSTAT) 0.4 MG SL tablet Place 1 tablet (0.4 mg total) under the tongue every 5 (five) minutes x 3 doses as needed for chest pain. 25 tablet 3 prn at unknown   PARoxetine (PAXIL) 40 MG tablet TAKE (1) TABLET BY MOUTH EVERY DAY 90 tablet 1 Past Week   sacubitril-valsartan (ENTRESTO) 97-103 MG Take 1 tablet by mouth 2 (two) times daily. 60 tablet 11 Past Week   torsemide (DEMADEX) 20 MG tablet Take 1 tablet (20 mg total) by mouth daily. 90 tablet 3 Past Week   Na Sulfate-K Sulfate-Mg Sulf 17.5-3.13-1.6 GM/177ML SOLN Take by mouth. (Patient not taking: Reported on 12/24/2022)   Not Taking  [facility]  CONSULT NOTE    Date: 12/28/2022                  Patient Name:  Angelica Herrera  MRN: [id number]  DOB: April 30, 1970  Age / Sex: 52 y.o., female         PCP: Danelle Berry, PA-C                 Service Requesting Consult: Dr. Ashok Pall                 Reason for Consult: Acute Kidney Injury            History of Present Illness: Ms. Angelica Herrera admitted with epigastric abodminal pain. Found to have NSTEMI and acute pancreatitis from alcohol. Placed on IVF.   Creatinine on admission of 3.63. Today, creatinine has trended down to 1.93 with potassium of 5.5.   Placed on PO antibiotics.      Medications: Outpatient medications: Facility-Administered Medications Prior to Admission  Medication Dose Route Frequency Provider Last Rate Last Admin   mometasone-formoterol (DULERA) 200-5 MCG/ACT inhaler 2 puff  2 puff Inhalation BID Danelle Berry, PA-C       Medications Prior to Admission  Medication Sig Dispense Refill Last Dose   acetaminophen (TYLENOL) 500 MG tablet Take 1,000 mg by mouth every 6 (six) hours as needed.   prn at unknown   aspirin 81 MG tablet Take 1 tablet (81 mg total) by mouth daily. 30 tablet  Past Month   atorvastatin (LIPITOR) 80 MG tablet Take 1 tablet (80 mg total) by mouth at bedtime. 90 tablet 2 Past Week   busPIRone (BUSPAR) 5 MG tablet TAKE 1 TO 3 TABLETS BY MOUTH TWICE DAILY AS NEEDED 120 tablet 0 Past Week   carvedilol (COREG) 25 MG tablet TAKE (1) TABLET BY MOUTH TWICE DAILY (Patient taking differently: Take 25 mg by mouth 2 (two) times daily with a meal.) 60 tablet 0 Past Week   cetirizine (ZYRTEC) 10 MG tablet TAKE (1) TABLET BY MOUTH  AT BEDTIME 30 tablet 6 Past Week   clopidogrel (PLAVIX) 75 MG tablet TAKE (1) TABLET BY MOUTH EVERY DAY 90 tablet 1 Past Week   colchicine 0.6 MG tablet Take 1 tablet (0.6 mg total) by mouth daily as needed (gout). 15 tablet 0 prn at unknown   famotidine (PEPCID) 20 MG tablet Take  1 tablet (20 mg total) by mouth 2 (two) times daily as needed for heartburn or indigestion. 60 tablet 2 prn at unknown   gabapentin (NEURONTIN) 300 MG capsule Take 1 capsule (300 mg total) by mouth 2 (two) times daily. 180 capsule 1 prn at unknown   levothyroxine (SYNTHROID) 125 MCG tablet TAKE (1) TABLET BY MOUTH EVERY MORNING BEFORE BREAKFAST 90 tablet 1 Past Week   nitroGLYCERIN (NITROSTAT) 0.4 MG SL tablet Place 1 tablet (0.4 mg total) under the tongue every 5 (five) minutes x 3 doses as needed for chest pain. 25 tablet 3 prn at unknown   PARoxetine (PAXIL) 40 MG tablet TAKE (1) TABLET BY MOUTH EVERY DAY 90 tablet 1 Past Week   sacubitril-valsartan (ENTRESTO) 97-103 MG Take 1 tablet by mouth 2 (two) times daily. 60 tablet 11 Past Week   torsemide (DEMADEX) 20 MG tablet Take 1 tablet (20 mg total) by mouth daily. 90 tablet 3 Past Week   Na Sulfate-K Sulfate-Mg Sulf 17.5-3.13-1.6 GM/177ML SOLN Take by mouth. (Patient not taking: Reported on 12/24/2022)   Not Taking  [facility]  CONSULT NOTE    Date: 12/28/2022                  Patient Name:  Angelica Herrera  MRN: [id number]  DOB: April 30, 1970  Age / Sex: 52 y.o., female         PCP: Danelle Berry, PA-C                 Service Requesting Consult: Dr. Ashok Pall                 Reason for Consult: Acute Kidney Injury            History of Present Illness: Ms. Angelica Herrera admitted with epigastric abodminal pain. Found to have NSTEMI and acute pancreatitis from alcohol. Placed on IVF.   Creatinine on admission of 3.63. Today, creatinine has trended down to 1.93 with potassium of 5.5.   Placed on PO antibiotics.      Medications: Outpatient medications: Facility-Administered Medications Prior to Admission  Medication Dose Route Frequency Provider Last Rate Last Admin   mometasone-formoterol (DULERA) 200-5 MCG/ACT inhaler 2 puff  2 puff Inhalation BID Danelle Berry, PA-C       Medications Prior to Admission  Medication Sig Dispense Refill Last Dose   acetaminophen (TYLENOL) 500 MG tablet Take 1,000 mg by mouth every 6 (six) hours as needed.   prn at unknown   aspirin 81 MG tablet Take 1 tablet (81 mg total) by mouth daily. 30 tablet  Past Month   atorvastatin (LIPITOR) 80 MG tablet Take 1 tablet (80 mg total) by mouth at bedtime. 90 tablet 2 Past Week   busPIRone (BUSPAR) 5 MG tablet TAKE 1 TO 3 TABLETS BY MOUTH TWICE DAILY AS NEEDED 120 tablet 0 Past Week   carvedilol (COREG) 25 MG tablet TAKE (1) TABLET BY MOUTH TWICE DAILY (Patient taking differently: Take 25 mg by mouth 2 (two) times daily with a meal.) 60 tablet 0 Past Week   cetirizine (ZYRTEC) 10 MG tablet TAKE (1) TABLET BY MOUTH  AT BEDTIME 30 tablet 6 Past Week   clopidogrel (PLAVIX) 75 MG tablet TAKE (1) TABLET BY MOUTH EVERY DAY 90 tablet 1 Past Week   colchicine 0.6 MG tablet Take 1 tablet (0.6 mg total) by mouth daily as needed (gout). 15 tablet 0 prn at unknown   famotidine (PEPCID) 20 MG tablet Take  1 tablet (20 mg total) by mouth 2 (two) times daily as needed for heartburn or indigestion. 60 tablet 2 prn at unknown   gabapentin (NEURONTIN) 300 MG capsule Take 1 capsule (300 mg total) by mouth 2 (two) times daily. 180 capsule 1 prn at unknown   levothyroxine (SYNTHROID) 125 MCG tablet TAKE (1) TABLET BY MOUTH EVERY MORNING BEFORE BREAKFAST 90 tablet 1 Past Week   nitroGLYCERIN (NITROSTAT) 0.4 MG SL tablet Place 1 tablet (0.4 mg total) under the tongue every 5 (five) minutes x 3 doses as needed for chest pain. 25 tablet 3 prn at unknown   PARoxetine (PAXIL) 40 MG tablet TAKE (1) TABLET BY MOUTH EVERY DAY 90 tablet 1 Past Week   sacubitril-valsartan (ENTRESTO) 97-103 MG Take 1 tablet by mouth 2 (two) times daily. 60 tablet 11 Past Week   torsemide (DEMADEX) 20 MG tablet Take 1 tablet (20 mg total) by mouth daily. 90 tablet 3 Past Week   Na Sulfate-K Sulfate-Mg Sulf 17.5-3.13-1.6 GM/177ML SOLN Take by mouth. (Patient not taking: Reported on 12/24/2022)   Not Taking  [facility]  CONSULT NOTE    Date: 12/28/2022                  Patient Name:  Angelica Herrera  MRN: [id number]  DOB: April 30, 1970  Age / Sex: 52 y.o., female         PCP: Danelle Berry, PA-C                 Service Requesting Consult: Dr. Ashok Pall                 Reason for Consult: Acute Kidney Injury            History of Present Illness: Ms. Angelica Herrera admitted with epigastric abodminal pain. Found to have NSTEMI and acute pancreatitis from alcohol. Placed on IVF.   Creatinine on admission of 3.63. Today, creatinine has trended down to 1.93 with potassium of 5.5.   Placed on PO antibiotics.      Medications: Outpatient medications: Facility-Administered Medications Prior to Admission  Medication Dose Route Frequency Provider Last Rate Last Admin   mometasone-formoterol (DULERA) 200-5 MCG/ACT inhaler 2 puff  2 puff Inhalation BID Danelle Berry, PA-C       Medications Prior to Admission  Medication Sig Dispense Refill Last Dose   acetaminophen (TYLENOL) 500 MG tablet Take 1,000 mg by mouth every 6 (six) hours as needed.   prn at unknown   aspirin 81 MG tablet Take 1 tablet (81 mg total) by mouth daily. 30 tablet  Past Month   atorvastatin (LIPITOR) 80 MG tablet Take 1 tablet (80 mg total) by mouth at bedtime. 90 tablet 2 Past Week   busPIRone (BUSPAR) 5 MG tablet TAKE 1 TO 3 TABLETS BY MOUTH TWICE DAILY AS NEEDED 120 tablet 0 Past Week   carvedilol (COREG) 25 MG tablet TAKE (1) TABLET BY MOUTH TWICE DAILY (Patient taking differently: Take 25 mg by mouth 2 (two) times daily with a meal.) 60 tablet 0 Past Week   cetirizine (ZYRTEC) 10 MG tablet TAKE (1) TABLET BY MOUTH  AT BEDTIME 30 tablet 6 Past Week   clopidogrel (PLAVIX) 75 MG tablet TAKE (1) TABLET BY MOUTH EVERY DAY 90 tablet 1 Past Week   colchicine 0.6 MG tablet Take 1 tablet (0.6 mg total) by mouth daily as needed (gout). 15 tablet 0 prn at unknown   famotidine (PEPCID) 20 MG tablet Take  1 tablet (20 mg total) by mouth 2 (two) times daily as needed for heartburn or indigestion. 60 tablet 2 prn at unknown   gabapentin (NEURONTIN) 300 MG capsule Take 1 capsule (300 mg total) by mouth 2 (two) times daily. 180 capsule 1 prn at unknown   levothyroxine (SYNTHROID) 125 MCG tablet TAKE (1) TABLET BY MOUTH EVERY MORNING BEFORE BREAKFAST 90 tablet 1 Past Week   nitroGLYCERIN (NITROSTAT) 0.4 MG SL tablet Place 1 tablet (0.4 mg total) under the tongue every 5 (five) minutes x 3 doses as needed for chest pain. 25 tablet 3 prn at unknown   PARoxetine (PAXIL) 40 MG tablet TAKE (1) TABLET BY MOUTH EVERY DAY 90 tablet 1 Past Week   sacubitril-valsartan (ENTRESTO) 97-103 MG Take 1 tablet by mouth 2 (two) times daily. 60 tablet 11 Past Week   torsemide (DEMADEX) 20 MG tablet Take 1 tablet (20 mg total) by mouth daily. 90 tablet 3 Past Week   Na Sulfate-K Sulfate-Mg Sulf 17.5-3.13-1.6 GM/177ML SOLN Take by mouth. (Patient not taking: Reported on 12/24/2022)   Not Taking  cefdinir, cefpodoxime, cefprozil, cefuroxime, cephalexin and loracarbef.     CEFTAZIDIME <=1 Sensitive     CEFEPIME <=1 Sensitive     CEFTRIAXONE <=1 Sensitive     CIPROFLOXACIN <=0.25 Sensitive     LEVOFLOXACIN <=0.12 Sensitive     GENTAMICIN <=1 Sensitive     IMIPENEM <=0.25 Sensitive     NITROFURANTOIN <=16 Sensitive     PIP/TAZO <=4 Sensitive     TOBRAMYCIN <=1 Sensitive     TRIMETH/SULFA* <=20 Sensitive      * For uncomplicated UTI caused by E. coli, K. pneumoniae or P. mirabilis: Cefazolin  is susceptible if MIC <32 mcg/mL and predicts susceptible to the oral agents cefaclor, cefdinir, cefpodoxime, cefprozil, cefuroxime, cephalexin and loracarbef. Legend: S = Susceptible  I = Intermediate R = Resistant  NS = Not susceptible * = Not tested  NR = Not reported **NN = See antimicrobic comments     Coagulation Studies: No results for input(s): "LABPROT", "INR" in the last 72 hours.  Urinalysis: Recent Labs    12/26/22 2306  COLORURINE STRAW*  LABSPEC 1.008  PHURINE 6.0  GLUCOSEU NEGATIVE  HGBUR NEGATIVE  BILIRUBINUR NEGATIVE  KETONESUR NEGATIVE  PROTEINUR NEGATIVE  NITRITE NEGATIVE  LEUKOCYTESUR NEGATIVE      Imaging: US RENAL ARTERY DUPLEX COMPLETE  Result Date: 12/27/2022 CLINICAL DATA:  [id number] AKI (acute kidney injury) (HCC) [id number] EXAM: RENAL/URINARY TRACT ULTRASOUND RENAL DUPLEX DOPPLER ULTRASOUND COMPARISON:  CT September 2024 FINDINGS: Right Kidney: Length: 10.7 cm. Echogenicity within normal limits. No mass or hydronephrosis visualized. Left Kidney: Length: 10.0 cm. Echogenicity within normal limits. No mass or hydronephrosis visualized. Bladder:  Unremarkable. RENAL DUPLEX ULTRASOUND Right Renal Artery Velocities: Origin:  180 cm/sec Mid:  80 cm/sec Hilum:  59 cm/sec Interlobar:  29 cm/sec Arcuate: 13 cm/sec; normal resistive indices measuring on average 0.5 Left Renal Artery Velocities: Origin:  100 cm/sec Mid:  154 cm/sec Hilum:  75 cm/sec Interlobar:  39 cm/sec Arcuate: 13 cm/sec; borderline low resistive indices measuring between 0.42 and 0.44 Aortic Velocity:  62 cm/sec Right Renal-Aortic Ratios: Origin: 2.9 Mid:  1.3 Hilum: 1.0 Interlobar: 0.4 Arcuate: 0.2 Left Renal-Aortic Ratios: Origin: 1.6 Mid: 2.5 Hilum: 1.2 Interlobar: 0.6 Arcuate: 0.2 IMPRESSION: 1. Arterial velocities near the upper limit of normal are noted at the origin of the right renal artery, which could suggest stenosis approaching 50-60%. 2. The patient has a known left renal artery stent.  There are slightly elevated velocities noted in the mid left renal artery, which could suggest an element of early in-stent stenosis. This is somewhat further supported by borderline low resistive indices measured in the arcuate arteries. These findings could be further evaluated with CT angiography if clinically appropriate. Electronically Signed   By: Olive Bass M.D.   On: 12/27/2022 14:49   ECHOCARDIOGRAM COMPLETE  Result Date: 12/27/2022    ECHOCARDIOGRAM REPORT   Patient Name:   Angelica Herrera Date of Exam: 12/27/2022 Medical Rec #:  [id number]           Height:       67.0 in Accession #:    [id number]          Weight:       179.0 lb Date of Birth:  August 05, 1970            BSA:          1.929 m Patient Age:    52 years            BP:

## 2022-12-29 DIAGNOSIS — D631 Anemia in chronic kidney disease: Secondary | ICD-10-CM | POA: Diagnosis not present

## 2022-12-29 DIAGNOSIS — E875 Hyperkalemia: Secondary | ICD-10-CM | POA: Diagnosis not present

## 2022-12-29 DIAGNOSIS — I129 Hypertensive chronic kidney disease with stage 1 through stage 4 chronic kidney disease, or unspecified chronic kidney disease: Secondary | ICD-10-CM | POA: Diagnosis not present

## 2022-12-29 DIAGNOSIS — N179 Acute kidney failure, unspecified: Secondary | ICD-10-CM | POA: Diagnosis not present

## 2022-12-29 DIAGNOSIS — I214 Non-ST elevation (NSTEMI) myocardial infarction: Secondary | ICD-10-CM | POA: Diagnosis not present

## 2022-12-29 DIAGNOSIS — N1832 Chronic kidney disease, stage 3b: Secondary | ICD-10-CM | POA: Diagnosis not present

## 2022-12-29 LAB — HEPATITIS B SURFACE ANTIGEN: Hepatitis B Surface Ag: NONREACTIVE

## 2022-12-29 LAB — MAGNESIUM: Magnesium: 2.6 mg/dL — ABNORMAL HIGH (ref 1.7–2.4)

## 2022-12-29 LAB — BASIC METABOLIC PANEL
Anion gap: 7 (ref 5–15)
BUN: 35 mg/dL — ABNORMAL HIGH (ref 6–20)
CO2: 23 mmol/L (ref 22–32)
Calcium: 8.9 mg/dL (ref 8.9–10.3)
Chloride: 105 mmol/L (ref 98–111)
Creatinine, Ser: 2.08 mg/dL — ABNORMAL HIGH (ref 0.44–1.00)
GFR, Estimated: 28 mL/min — ABNORMAL LOW (ref >60)
Glucose, Bld: 95 mg/dL (ref 70–99)
Potassium: 4.8 mmol/L (ref 3.5–5.1)
Sodium: 135 mmol/L (ref 135–145)

## 2022-12-29 LAB — HEPATITIS B CORE ANTIBODY, TOTAL: Hep B Core Total Ab: NONREACTIVE

## 2022-12-29 LAB — HEPATITIS B CORE ANTIBODY, IGM: Hep B C IgM: NONREACTIVE

## 2022-12-29 MED ORDER — AMOXICILLIN-POT CLAVULANATE 875-125 MG PO TABS: 1.0000 | ORAL_TABLET | Freq: Two times a day (BID) | ORAL | 0 refills | Status: AC

## 2022-12-29 NOTE — Discharge Summary (Addendum)
Angelica Herrera:[id number] DOB: 10-10-1970 DOA: 12/24/2022  PCP: Danelle Berry, PA-C  Admit date: 12/24/2022 Discharge date: 12/29/2022  Time spent: 35 minutes  Recommendations for Outpatient Follow-up:  Close cardiology f/u, bmp at f/u Establish with nephrology     Discharge Diagnoses:  Principal Problem:   NSTEMI (non-ST elevated myocardial infarction) (HCC) Active Problems:   Tobacco abuse   CKD (chronic kidney disease), stage IV (HCC)   GAD (generalized anxiety disorder)   Coronary artery disease involving native coronary artery   Accelerated hypertension   COPD with acute exacerbation (HCC)   Alcoholic pancreatitis   Acute renal failure with acute tubular necrosis superimposed on stage 3b chronic kidney disease (HCC)   Demand ischemia   Elevated troponin   Discharge Condition: stable  Diet recommendation: heart healthy  Filed Weights   12/24/22 1000  Weight: 81.2 kg    History of present illness:  From admission h and p Angelica Herrera is a 52 y.o. female with medical history significant of alcoholic pancreatitis, CAD stenting, chronic HFrEF with LVEF 35-40%, renal artery stenosis status post stenting on left side, HTN, CKD stage IIIb, GERD, anxiety/depression, presented with worsening of abdominal pain, nauseous vomiting and diarrhea.   Patient has history of alcoholic pancreatitis but she continues to drink alcohol 3-4 times a week.  This time, her symptoms started 3 days ago with new onset of epigastric pain radiating to right RUQ, cramping-like intermittent, worsening with eating meals nonradiating, "I thought the pancreatitis has come back" associated with chills but no fever, same time she also developed frequent nauseous vomiting after eating meals and loose diarrhea.  She feels that overall her oral intake including solid food and liquids is decreased and her urine output also decreased as compared to her baseline.  Her PCP prescribed her with 2  "stomach  medicine ", despite taking them she reports no improvement of any of her symptoms.  But denied any flank pain or back pain.  Denied any tenesmus.  She reported improvement of diarrhea overnight but continued to feel nauseous and vomiting when trying to eat.  Patient also reported that the abdominal pain she experienced a similar to the pain when she had her first pancreatitis but unlike the pain she experienced when she had MI.  Hospital Course:   # Abdominal pain # Vomiting, diarrhea History of this pain at least several months. Epigastric and ruq. Hx of pancreatitis and lipase mildly elevated but no signs of pancreatitis on CT. Had EGD and colonoscopy in July significant only for gastritis. RUQ u/s this admission unremarkable as well. It's possible this is atypical chest pain (see below), but not likely. GI advised HIDA scan as next step so we obtained that, also negative. Pain waxes and wanes. No vomiting or diarrhea here so no stool studies performed. Tolerating diet.  - GI f/u - continue acid suppression   # demand ischemia No chest pain but does have epigastric pain. Trops elevated to 200s, improved to 100s. With nonspecific TWIs. TTE stable. Treated initially w/ heparin now discontinued.  - outpt cardiology f/u   # Toothache Chronic problem, awoke with left lower jaw swelling and pain, no obvious abscess. Improving w/ abx - will start augmentin and patient aware needs close dental f/u.   # AKI on ckd 3b # Hx RAS # Hyperkalemia Baseline cr appears to be in mid 1s, here 3s, saw cardiology recently and there was concern about over-diuresis. Does report vomiting recently and does not appear fluid  up on exam (despite pulm edema read on CXR). Kidney function improved with fluids but now stable at around 2. Had hyperkalemia that resolved. Seen by nephrology who advises establishing as outpt - home entresto and torsemide on hold - bmp 1 week at cardiology f/u   # HFrEF # CAD Does not appear  exacerbated. Hx DES. EF 35-40 in 2020. - Diuretics and Entresto on hold - continue asa/plavix, atorva, coreg   # Chronic pain - cont home gabapentin   # GAD - cont home buspar, paxil   # Hypothyroidism -Continue Synthroid   # COPD Quiescent - cont home dulera   # Alcohol abuse Tells me last drink several days ago, denies regular alcohol use, denies hx withdrawal, no signs of withdrawal here  Procedures: None    Consultations: Cardiology, nephrology  Discharge Exam: Vitals:   12/29/22 0937 12/29/22 1000  BP: (!) 163/106 (!) 137/92  Pulse:    Resp:    Temp:    SpO2:      General exam: Appears calm and comfortable  Respiratory system: Clear to auscultation. Respiratory effort normal. Mouth: left lower jaw swelling improving, carie that tooth, no signs abscess in mouth Cardiovascular system: S1 & S2 heard, RRR. No JVD, murmurs, rubs, gallops or clicks. No pedal edema. Gastrointestinal system: Abdomen is nondistended, soft and tender upper quadrants Central nervous system: Alert and oriented. No focal neurological deficits. Extremities: Symmetric 5 x 5 power. Skin: No rashes, lesions or ulcers Psychiatry: Judgement and insight appear normal. Mood & affect appropriate.   Discharge Instructions   Discharge Instructions     Diet - low sodium heart healthy   Complete by: As directed    Increase activity slowly   Complete by: As directed       Allergies as of 12/29/2022   No Known Allergies      Medication List     STOP taking these medications    potassium chloride SA 20 MEQ tablet Commonly known as: KLOR-CON M   sacubitril-valsartan 97-103 MG Commonly known as: ENTRESTO   torsemide 20 MG tablet Commonly known as: DEMADEX       TAKE these medications    acetaminophen 500 MG tablet Commonly known as: TYLENOL Take 1,000 mg by mouth every 6 (six) hours as needed.   amoxicillin-clavulanate 875-125 MG tablet Commonly known as: AUGMENTIN Take 1  tablet by mouth every 12 (twelve) hours for 7 days.   aspirin 81 MG tablet Take 1 tablet (81 mg total) by mouth daily.   atorvastatin 80 MG tablet Commonly known as: LIPITOR Take 1 tablet (80 mg total) by mouth at bedtime.   busPIRone 5 MG tablet Commonly known as: BUSPAR TAKE 1 TO 3 TABLETS BY MOUTH TWICE DAILY AS NEEDED   carvedilol 25 MG tablet Commonly known as: COREG TAKE (1) TABLET BY MOUTH TWICE DAILY What changed: See the new instructions.   cetirizine 10 MG tablet Commonly known as: ZYRTEC TAKE (1) TABLET BY MOUTH  AT BEDTIME   clopidogrel 75 MG tablet Commonly known as: PLAVIX TAKE (1) TABLET BY MOUTH EVERY DAY   colchicine 0.6 MG tablet Take 1 tablet (0.6 mg total) by mouth daily as needed (gout).   famotidine 20 MG tablet Commonly known as: Pepcid Take 1 tablet (20 mg total) by mouth 2 (two) times daily as needed for heartburn or indigestion.   gabapentin 300 MG capsule Commonly known as: NEURONTIN Take 1 capsule (300 mg total) by mouth 2 (two) times daily.  levothyroxine 125 MCG tablet Commonly known as: SYNTHROID TAKE (1) TABLET BY MOUTH EVERY MORNING BEFORE BREAKFAST   Na Sulfate-K Sulfate-Mg Sulf 17.5-3.13-1.6 GM/177ML Soln Take by mouth.   nicotine 7 mg/24hr patch Commonly known as: NICODERM CQ - dosed in mg/24 hr Place 1 patch (7 mg total) onto the skin daily.   nitroGLYCERIN 0.4 MG SL tablet Commonly known as: NITROSTAT Place 1 tablet (0.4 mg total) under the tongue every 5 (five) minutes x 3 doses as needed for chest pain.   ondansetron 4 MG disintegrating tablet Commonly known as: ZOFRAN-ODT Take 1 tablet (4 mg total) by mouth every 8 (eight) hours as needed for nausea or vomiting.   pantoprazole 40 MG tablet Commonly known as: PROTONIX Take 1 tablet (40 mg total) by mouth 2 (two) times daily. Empty stomach - take more than 4 hours after taking levothyroxine   PARoxetine 40 MG tablet Commonly known as: PAXIL TAKE (1) TABLET BY MOUTH  EVERY DAY       No Known Allergies  Follow-up Information     Sondra Barges, PA-C Follow up.   Specialties: Cardiology, Radiology Why: 1-2 weeks Contact information: [address] [place] [address] [phone]         Lamont Dowdy, MD Follow up.   Specialty: Nephrology Why: call to schedule an appointment Contact information: [address] [place] [address] [phone]         Danelle Berry, PA-C Follow up.   Specialty: Family Medicine Contact information: [address] [place] [address] [phone]                  The results of significant diagnostics from this hospitalization (including imaging, microbiology, ancillary and laboratory) are listed below for reference.    Significant Diagnostic Studies: US RENAL ARTERY DUPLEX COMPLETE  Result Date: 12/27/2022 CLINICAL DATA:  [id number] AKI (acute kidney injury) (HCC) [id number] EXAM: RENAL/URINARY TRACT ULTRASOUND RENAL DUPLEX DOPPLER ULTRASOUND COMPARISON:  CT September 2024 FINDINGS: Right Kidney: Length: 10.7 cm. Echogenicity within normal limits. No mass or hydronephrosis visualized. Left Kidney: Length: 10.0 cm. Echogenicity within normal limits. No mass or hydronephrosis visualized. Bladder:  Unremarkable. RENAL DUPLEX ULTRASOUND Right Renal Artery Velocities: Origin:  180 cm/sec Mid:  80 cm/sec Hilum:  59 cm/sec Interlobar:  29 cm/sec Arcuate: 13 cm/sec; normal resistive indices measuring on average 0.5 Left Renal Artery Velocities: Origin:  100 cm/sec Mid:  154 cm/sec Hilum:  75 cm/sec Interlobar:  39 cm/sec Arcuate: 13 cm/sec; borderline low resistive indices measuring between 0.42 and 0.44 Aortic Velocity:  62 cm/sec Right Renal-Aortic Ratios: Origin: 2.9 Mid:  1.3 Hilum: 1.0 Interlobar: 0.4 Arcuate: 0.2 Left Renal-Aortic Ratios: Origin: 1.6 Mid: 2.5 Hilum: 1.2 Interlobar: 0.6 Arcuate: 0.2 IMPRESSION: 1. Arterial velocities near the upper limit of  normal are noted at the origin of the right renal artery, which could suggest stenosis approaching 50-60%. 2. The patient has a known left renal artery stent. There are slightly elevated velocities noted in the mid left renal artery, which could suggest an element of early in-stent stenosis. This is somewhat further supported by borderline low resistive indices measured in the arcuate arteries. These findings could be further evaluated with CT angiography if clinically appropriate. Electronically Signed   By: Olive Bass M.D.   On: 12/27/2022 14:49   ECHOCARDIOGRAM COMPLETE  Result Date: 12/27/2022    ECHOCARDIOGRAM REPORT   Patient Name:   Doreatha Lew Graves Date of Exam:  12/27/2022 Medical Rec #:  [id number]           Height:       67.0 in Accession #:    [id number]          Weight:       179.0 lb Date of Birth:  [birth year]            BSA:          1.929 m Patient Age:    52 years            BP:           109/69 mmHg Patient Gender: F                   HR:           68 bpm. Exam Location:  [facility] Procedure: 2D Echo, 3D Echo, Cardiac Doppler, Color Doppler and Strain Analysis Indications:     NSTEMI  History:         Patient has prior history of Echocardiogram examinations, most                  recent 02/19/2019. CHF and Cardiomyopathy, Acute MI, CAD and                  Previous Myocardial Infarction, COPD; Risk                  Factors:Hypertension, Dyslipidemia and Current Smoker. CKD.  Sonographer:     Mikki Harbor Referring Phys:  [id number] Antonieta Iba Diagnosing Phys: Debbe Odea MD  Sonographer Comments: Global longitudinal strain was attempted. IMPRESSIONS  1. Left ventricular ejection fraction, by estimation, is 35 to 40%. Left ventricular ejection fraction by 2D MOD biplane is 41.7 %. Left ventricular ejection fraction by PLAX is 39 %. The left ventricle has moderately decreased function. The left ventricle demonstrates global hypokinesis. There is mild left ventricular hypertrophy. Left  ventricular diastolic parameters are consistent with Grade II diastolic dysfunction (pseudonormalization). There is severe hypokinesis of the left ventricular, entire anterior wall and anteroseptal wall.  2. Right ventricular systolic function is low normal. The right ventricular size is normal.  3. Left atrial size was moderately dilated.  4. Right atrial size was moderately dilated.  5. The mitral valve is normal in structure. Moderate to severe mitral valve regurgitation.  6. Tricuspid valve regurgitation is moderate.  7. The aortic valve is grossly normal. Aortic valve regurgitation is not visualized. FINDINGS  Left Ventricle: Left ventricular ejection fraction, by estimation, is 35 to 40%. Left ventricular ejection fraction by PLAX is 39 %. Left ventricular ejection fraction by 2D MOD biplane is 41.7 %. The left ventricle has moderately decreased function. The left ventricle demonstrates global hypokinesis. Severe hypokinesis of the left ventricular, entire anterior wall and anteroseptal wall. Global longitudinal strain performed but not reported based on interpreter judgement due to suboptimal tracking. The left ventricular internal cavity size was normal in size. There is mild left ventricular hypertrophy. Left ventricular diastolic parameters are consistent with Grade II diastolic dysfunction (pseudonormalization). Right Ventricle: The right ventricular size is normal. No increase in right ventricular wall thickness. Right ventricular systolic function is low normal. Left Atrium: Left atrial size was moderately dilated. Right Atrium: Right atrial size was moderately dilated. Pericardium: There is no evidence of pericardial effusion. Mitral Valve: The mitral valve is normal in structure. Moderate to severe mitral valve regurgitation. MV peak gradient, 8.4 mmHg. The mean mitral valve gradient  is 2.0 mmHg. Tricuspid Valve: The tricuspid valve is normal in structure. Tricuspid valve regurgitation is moderate.  Aortic Valve: The aortic valve is grossly normal. Aortic valve regurgitation is not visualized. Aortic valve mean gradient measures 3.0 mmHg. Aortic valve peak gradient measures 5.6 mmHg. Aortic valve area, by VTI measures 1.89 cm. Pulmonic Valve: The pulmonic valve was normal in structure. Pulmonic valve regurgitation is mild. Aorta: The aortic root and ascending aorta are structurally normal, with no evidence of dilitation. Venous: The inferior vena cava was not well visualized. IAS/Shunts: No atrial level shunt detected by color flow Doppler.  LEFT VENTRICLE PLAX 2D                        Biplane EF (MOD) LV EF:         Left            LV Biplane EF:   Left                ventricular                      ventricular                ejection                         ejection                fraction by                      fraction by                PLAX is 39                       2D MOD                %.                               biplane is LVIDd:         4.70 cm                          41.7 %. LVIDs:         3.80 cm LV PW:         1.10 cm         Diastology LV IVS:        1.20 cm         LV e' medial:    7.51 cm/s LVOT diam:     2.00 cm         LV E/e' medial:  15.7 LV SV:         40              LV e' lateral:   11.40 cm/s LV SV Index:   21              LV E/e' lateral: 10.4 LVOT Area:     3.14 cm  LV Volumes (MOD) LV vol d, MOD    88.6 ml A2C: LV vol d, MOD    60.3 ml A4C: LV vol s, MOD    48.2 ml A2C: LV vol s, MOD    33.1 ml A4C: LV SV MOD A2C:  40.4 ml LV SV MOD A4C:   60.3 ml LV SV MOD BP:    30.6 ml RIGHT VENTRICLE RV Basal diam:  4.05 cm RV Mid diam:    4.20 cm RV S prime:     176.90 cm/s TAPSE (M-mode): 2.2 cm LEFT ATRIUM              Index        RIGHT ATRIUM           Index LA diam:        4.70 cm  2.44 cm/m   RA Area:     29.90 cm LA Vol (A2C):   104.0 ml 53.91 ml/m  RA Volume:   99.60 ml  51.63 ml/m LA Vol (A4C):   88.1 ml  45.67 ml/m LA Biplane Vol: 101.0 ml 52.35 ml/m  AORTIC VALVE                     PULMONIC VALVE AV Area (Vmax):    2.04 cm     PV Vmax:       1.63 m/s AV Area (Vmean):   1.92 cm     PV Peak grad:  10.6 mmHg AV Area (VTI):     1.89 cm AV Vmax:           118.00 cm/s AV Vmean:          73.900 cm/s AV VTI:            0.213 m AV Peak Grad:      5.6 mmHg AV Mean Grad:      3.0 mmHg LVOT Vmax:         76.60 cm/s LVOT Vmean:        45.100 cm/s LVOT VTI:          0.128 m LVOT/AV VTI ratio: 0.60  AORTA Ao Root diam: 3.20 cm Ao Asc diam:  2.70 cm MITRAL VALVE                  TRICUSPID VALVE MV Area (PHT): 5.02 cm       TR Peak grad:   41.5 mmHg MV Area VTI:   1.13 cm       TR Vmax:        322.00 cm/s MV Peak grad:  8.4 mmHg MV Mean grad:  2.0 mmHg       SHUNTS MV Vmax:       1.45 m/s       Systemic VTI:  0.13 m MV Vmean:      64.9 cm/s      Systemic Diam: 2.00 cm MV Decel Time: 151 msec MR Peak grad:    111.9 mmHg MR Mean grad:    69.0 mmHg MR Vmax:         529.00 cm/s MR Vmean:        383.0 cm/s MR PISA:         1.57 cm MR PISA Eff ROA: 25 mm MR PISA Radius:  0.50 cm MV E velocity: 118.00 cm/s MV A velocity: 89.60 cm/s MV E/A ratio:  1.32 Debbe Odea MD Electronically signed by Debbe Odea MD Signature Date/Time: 12/27/2022/11:51:50 AM    Final    NM Hepatobiliary Liver Func  Result Date: 12/25/2022 CLINICAL DATA:  Right upper quadrant abdominal pain, biliary disease suspected. EXAM: NUCLEAR MEDICINE HEPATOBILIARY IMAGING TECHNIQUE: Sequential images of the abdomen were obtained out to 60 minutes following intravenous administration of radiopharmaceutical. RADIOPHARMACEUTICALS:  5.4  mCi Tc-69m  Choletec IV COMPARISON:  CT December 24, 2022 FINDINGS: Prompt uptake and biliary excretion of activity by the liver is seen. Gallbladder activity is visualized, consistent with patency of cystic duct. Biliary activity passes into small bowel, consistent with patent common bile duct. IMPRESSION: Gallbladder and duodenal activity identified compatible with a patent cystic and  common bile ducts. Electronically Signed   By: Maudry Mayhew M.D.   On: 12/25/2022 12:38   DG Chest 1 View  Result Date: 12/25/2022 CLINICAL DATA:  52 year old female congestive heart failure. EXAM: CHEST  1 VIEW COMPARISON:  Portable chest 12/24/2022 and earlier. FINDINGS: Portable AP upright view at 0702 hours. Stable cardiomegaly and mediastinal contours. Stable lung volumes. Platelike atelectasis or scarring in the left mid lung is stable. Pulmonary vascularity is stable with some Kerley B lines visible in the right lower lung. No pneumothorax, pleural effusion or consolidation. Visualized tracheal air column is within normal limits. No acute osseous abnormality identified. Paucity of bowel gas. IMPRESSION: Stable cardiomegaly and pulmonary interstitial edema, atelectasis. Electronically Signed   By: Odessa Fleming M.D.   On: 12/25/2022 07:22   US RENAL  Result Date: 12/24/2022 CLINICAL DATA:  Acute kidney injury. EXAM: RENAL / URINARY TRACT ULTRASOUND COMPLETE COMPARISON:  Abdomen and pelvis CT obtained today. FINDINGS: Right Kidney: Renal measurements: 7.0 x 3.8 x 2.9 cm = volume: 39 mL. Echogenicity within normal limits. No mass or hydronephrosis visualized. Left Kidney: Renal measurements: 7.8 x 3.5 x 3.5 cm = volume: 49 mL. Echogenicity within normal limits. No mass or hydronephrosis visualized. Bladder: Appears normal for degree of bladder distention. Other: None. IMPRESSION: Both kidneys are somewhat small.  Otherwise, normal examination. Electronically Signed   By: Beckie Salts M.D.   On: 12/24/2022 18:34   CT ABDOMEN PELVIS WO CONTRAST  Result Date: 12/24/2022 CLINICAL DATA:  Abdominal pain, acute, nonlocalized EXAM: CT ABDOMEN AND PELVIS WITHOUT CONTRAST TECHNIQUE: Multidetector CT imaging of the abdomen and pelvis was performed following the standard protocol without IV contrast. RADIATION DOSE REDUCTION: This exam was performed according to the departmental dose-optimization program which  includes automated exposure control, adjustment of the mA and/or kV according to patient size and/or use of iterative reconstruction technique. COMPARISON:  CT AP, 10/16/2022 and 11/12/2018 FINDINGS: Suboptimal evaluation, secondary to a lack of intravenous contrast. Lower chest: Small pericardial effusion. Cardiomegaly, incompletely imaged. Hepatobiliary: Relatively hypodense liver. No focal abnormality. Prominent Riedel lobe. No gallstones, gallbladder wall thickening, or biliary dilatation. Pancreas: No pancreatic ductal dilatation or surrounding inflammatory changes. Spleen: Normal in size without focal abnormality. Adrenals/Urinary Tract: Adrenal glands are unremarkable. Kidneys are normal, without renal calculi, focal lesion, or hydronephrosis. Bladder is unremarkable. Stomach/Bowel: Stomach is within normal limits. Appendix is not definitively visualized. No evidence of bowel wall thickening, distention, or inflammatory changes. Vascular/Lymphatic: Proximal LEFT renal stent. Moderate burden of aortic atherosclerosis without aneurysmal dilatation. No enlarged abdominal or pelvic lymph nodes. Reproductive: Hysterectomy.  No adnexal mass. Other: No abdominal wall hernia or abnormality. No abdominopelvic ascites. Musculoskeletal: No acute or significant osseous findings. IMPRESSION: Suboptimal evaluation, within these constraints; 1. No acute abdominopelvic process. 2. Cardiomegaly with small volume of pericardial effusion, incompletely imaged. 3. Hypodense liver. Findings most commonly seen in hepatic steatosis. Additional incidental, chronic and senescent findings as above. Electronically Signed   By: Roanna Banning M.D.   On: 12/24/2022 15:06   US ABDOMEN LIMITED RUQ (LIVER/GB)  Result Date: 12/24/2022 CLINICAL DATA:  Abdominal pain EXAM: ULTRASOUND ABDOMEN LIMITED RIGHT UPPER QUADRANT COMPARISON:  None Available. FINDINGS: Gallbladder: No gallstones or wall thickening visualized. No sonographic Murphy sign  noted by sonographer. Common bile duct: Diameter: 0.3 cm Liver: No focal lesion identified. Within normal limits in parenchymal echogenicity. Portal vein is patent on color Doppler imaging with normal direction of blood flow towards the liver. Other: None. IMPRESSION: 1. Normal sonographic appearance of the liver and gallbladder. Electronically Signed   By: Gaylyn Rong M.D.   On: 12/24/2022 13:19   DG Chest Port 1 View  Result Date: 12/24/2022 CLINICAL DATA:  Chest pain and cough.  Vomiting for 3 days EXAM: PORTABLE CHEST 1 VIEW COMPARISON:  02/25/2019 FINDINGS: Moderate cardiomegaly. Upper zone pulmonary vascular prominence favoring pulmonary venous hypertension. Bandlike atelectasis in the left mid lung. No blunting of the costophrenic angles. No significant bony abnormality identified. IMPRESSION: 1. Moderate cardiomegaly with pulmonary venous hypertension. 2. Bandlike atelectasis in the left mid lung. Electronically Signed   By: Gaylyn Rong M.D.   On: 12/24/2022 13:17    Microbiology: No results found for this or any previous visit (from the past 240 hour(s)).   Labs: Basic Metabolic Panel: Recent Labs  Lab 12/24/22 1002 12/25/22 0436 12/27/22 0605 12/28/22 0623 12/28/22 1110 12/28/22 1555 12/29/22 0433  NA 140   < > 141 137 137 138 135  K 4.3   < > 4.7 5.3* 5.5* 5.1 4.8  CL 102   < > 107 107 109 110 105  CO2 22   < > 25 24 24  20* 23  GLUCOSE 103*   < > 107* 105* 115* 102* 95  BUN 57*   < > 42* 31* 30* 31* 35*  CREATININE 3.63*   < > 1.97* 1.75* 1.93* 2.06* 2.08*  CALCIUM 9.6   < > 8.4* 8.5* 8.2* 8.8* 8.9  MG 2.3  --   --   --   --   --  2.6*   < > = values in this interval not displayed.   Liver Function Tests: Recent Labs  Lab 12/24/22 1002 12/28/22 0623 12/28/22 1555  AST 37 18  --   ALT 30 20  --   ALKPHOS 100 83  --   BILITOT 1.4* 0.7  --   PROT 7.2 5.7*  --   ALBUMIN 4.4 3.4* 3.7   Recent Labs  Lab 12/24/22 1002 12/25/22 0436  LIPASE 61* 37    No results for input(s): "AMMONIA" in the last 168 hours. CBC: Recent Labs  Lab 12/24/22 1002 12/25/22 0436 12/26/22 0206 12/27/22 0605  WBC 13.0* 11.2* 6.5 7.6  HGB 12.8 12.1 10.6* 11.7*  HCT 40.6 39.7 34.8* 38.6  MCV 100.2* 106.7* 105.1* 105.8*  PLT 218 239 180 189   Cardiac Enzymes: No results for input(s): "CKTOTAL", "CKMB", "CKMBINDEX", "TROPONINI" in the last 168 hours. BNP: BNP (last 3 results) No results for input(s): "BNP" in the last 8760 hours.  ProBNP (last 3 results) No results for input(s): "PROBNP" in the last 8760 hours.  CBG: Recent Labs  Lab 12/28/22 1401  GLUCAP 81       Signed:  Silvano Bilis MD.  [facility] 12/29/2022, 11:20 AM

## 2022-12-29 NOTE — Progress Notes (Signed)
[facility]  ROUNDING NOTE   Subjective:   Watching television. States she is having tooth pain this morning.   She states she ate her breakfast without issues.   Objective:  Vital signs in last 24 hours:  Temp:  [97.6 F (36.4 C)-98.7 F (37.1 C)] 97.6 F (36.4 C) (09/15 0805) Pulse Rate:  [65-80] 79 (09/15 0420) Resp:  [16-23] 18 (09/15 0805) BP: (121-186)/(75-122) 137/92 (09/15 1000) SpO2:  [90 %-100 %] 100 % (09/15 0805)  Weight change:  Filed Weights   12/24/22 1000  Weight: 81.2 kg    Intake/Output: I/O last 3 completed shifts: In: 1340 [P.O.:840; IV Piggyback:500] Out: -    Intake/Output this shift:  Total I/O In: 240 [P.O.:240] Out: -   Physical Exam: General: NAD, sitting up in bed  Head: Normocephalic, atraumatic. Moist oral mucosal membranes  Eyes: Anicteric, PERRL  Neck: Supple, trachea midline  Lungs:  Clear to auscultation  Heart: Regular rate and rhythm  Abdomen:  Soft, nontender,   Extremities:  no peripheral edema.  Neurologic: Nonfocal, moving all four extremities  Skin: No lesions  Access: none    Basic Metabolic Panel: Recent Labs  Lab 12/24/22 1002 12/25/22 0436 12/27/22 0605 12/28/22 0623 12/28/22 1110 12/28/22 1555 12/29/22 0433  NA 140   < > 141 137 137 138 135  K 4.3   < > 4.7 5.3* 5.5* 5.1 4.8  CL 102   < > 107 107 109 110 105  CO2 22   < > 25 24 24  20* 23  GLUCOSE 103*   < > 107* 105* 115* 102* 95  BUN 57*   < > 42* 31* 30* 31* 35*  CREATININE 3.63*   < > 1.97* 1.75* 1.93* 2.06* 2.08*  CALCIUM 9.6   < > 8.4* 8.5* 8.2* 8.8* 8.9  MG 2.3  --   --   --   --   --  2.6*   < > = values in this interval not displayed.    Liver Function Tests: Recent Labs  Lab 12/24/22 1002 12/28/22 0623 12/28/22 1555  AST 37 18  --   ALT 30 20  --   ALKPHOS 100 83  --   BILITOT 1.4* 0.7  --   PROT 7.2 5.7*  --   ALBUMIN 4.4 3.4* 3.7   Recent Labs  Lab 12/24/22 1002 12/25/22 0436  LIPASE 61* 37   No results for  input(s): "AMMONIA" in the last 168 hours.  CBC: Recent Labs  Lab 12/24/22 1002 12/25/22 0436 12/26/22 0206 12/27/22 0605  WBC 13.0* 11.2* 6.5 7.6  HGB 12.8 12.1 10.6* 11.7*  HCT 40.6 39.7 34.8* 38.6  MCV 100.2* 106.7* 105.1* 105.8*  PLT 218 239 180 189    Cardiac Enzymes: No results for input(s): "CKTOTAL", "CKMB", "CKMBINDEX", "TROPONINI" in the last 168 hours.  BNP: Invalid input(s): "POCBNP"  CBG: Recent Labs  Lab 12/28/22 1401  GLUCAP 81    Microbiology: Results for orders placed or performed in visit on 05/31/22  Urine Culture     Status: Abnormal   Collection Time: 05/31/22 12:13 PM   Specimen: Urine  Result Value Ref Range Status   MICRO NUMBER: [id number]  Final   SPECIMEN QUALITY: Adequate  Final   Sample Source URINE  Final   STATUS: FINAL  Final   ISOLATE 1: Escherichia coli (A)  Final    Comment: 10,000-49,000 CFU/mL of Escherichia coli      Susceptibility   Escherichia coli -  URINE CULTURE, REFLEX    AMOX/CLAVULANIC >=32 Resistant     AMPICILLIN >=32 Resistant     AMPICILLIN/SULBACTAM 16 Intermediate     CEFAZOLIN* 8 Resistant      * For uncomplicated UTI caused by E. coli, K. pneumoniae or P. mirabilis: Cefazolin is susceptible if MIC <32 mcg/mL and predicts susceptible to the oral agents cefaclor, cefdinir, cefpodoxime, cefprozil, cefuroxime, cephalexin and loracarbef.     CEFTAZIDIME <=1 Sensitive     CEFEPIME <=1 Sensitive     CEFTRIAXONE <=1 Sensitive     CIPROFLOXACIN <=0.25 Sensitive     LEVOFLOXACIN <=0.12 Sensitive     GENTAMICIN <=1 Sensitive     IMIPENEM <=0.25 Sensitive     NITROFURANTOIN <=16 Sensitive     PIP/TAZO <=4 Sensitive     TOBRAMYCIN <=1 Sensitive     TRIMETH/SULFA* <=20 Sensitive      * For uncomplicated UTI caused by E. coli, K. pneumoniae or P. mirabilis: Cefazolin is susceptible if MIC <32 mcg/mL and predicts susceptible to the oral agents cefaclor, cefdinir, cefpodoxime, cefprozil, cefuroxime, cephalexin and  loracarbef. Legend: S = Susceptible  I = Intermediate R = Resistant  NS = Not susceptible * = Not tested  NR = Not reported **NN = See antimicrobic comments     Coagulation Studies: No results for input(s): "LABPROT", "INR" in the last 72 hours.  Urinalysis: Recent Labs    12/26/22 2306  COLORURINE STRAW*  LABSPEC 1.008  PHURINE 6.0  GLUCOSEU NEGATIVE  HGBUR NEGATIVE  BILIRUBINUR NEGATIVE  KETONESUR NEGATIVE  PROTEINUR NEGATIVE  NITRITE NEGATIVE  LEUKOCYTESUR NEGATIVE      Imaging: US RENAL ARTERY DUPLEX COMPLETE  Result Date: 12/27/2022 CLINICAL DATA:  [id number] AKI (acute kidney injury) (HCC) [id number] EXAM: RENAL/URINARY TRACT ULTRASOUND RENAL DUPLEX DOPPLER ULTRASOUND COMPARISON:  CT September 2024 FINDINGS: Right Kidney: Length: 10.7 cm. Echogenicity within normal limits. No mass or hydronephrosis visualized. Left Kidney: Length: 10.0 cm. Echogenicity within normal limits. No mass or hydronephrosis visualized. Bladder:  Unremarkable. RENAL DUPLEX ULTRASOUND Right Renal Artery Velocities: Origin:  180 cm/sec Mid:  80 cm/sec Hilum:  59 cm/sec Interlobar:  29 cm/sec Arcuate: 13 cm/sec; normal resistive indices measuring on average 0.5 Left Renal Artery Velocities: Origin:  100 cm/sec Mid:  154 cm/sec Hilum:  75 cm/sec Interlobar:  39 cm/sec Arcuate: 13 cm/sec; borderline low resistive indices measuring between 0.42 and 0.44 Aortic Velocity:  62 cm/sec Right Renal-Aortic Ratios: Origin: 2.9 Mid:  1.3 Hilum: 1.0 Interlobar: 0.4 Arcuate: 0.2 Left Renal-Aortic Ratios: Origin: 1.6 Mid: 2.5 Hilum: 1.2 Interlobar: 0.6 Arcuate: 0.2 IMPRESSION: 1. Arterial velocities near the upper limit of normal are noted at the origin of the right renal artery, which could suggest stenosis approaching 50-60%. 2. The patient has a known left renal artery stent. There are slightly elevated velocities noted in the mid left renal artery, which could suggest an element of early in-stent stenosis. This is somewhat  further supported by borderline low resistive indices measured in the arcuate arteries. These findings could be further evaluated with CT angiography if clinically appropriate. Electronically Signed   By: Olive Bass M.D.   On: 12/27/2022 14:49     Medications:     amoxicillin-clavulanate  1 tablet Oral Q12H   aspirin EC  81 mg Oral Daily   atorvastatin  80 mg Oral QHS   carvedilol  12.5 mg Oral BID WC   clopidogrel  75 mg Oral Daily   enoxaparin (LOVENOX) injection  40 mg Subcutaneous Q24H  folic acid  1 mg Oral Daily   gabapentin  300 mg Oral BID   influenza vac split trivalent PF  0.5 mL Intramuscular Tomorrow-1000   levothyroxine  125 mcg Oral Q0600   loratadine  10 mg Oral Daily   mometasone-formoterol  2 puff Inhalation BID   multivitamin with minerals  1 tablet Oral Daily   nicotine  7 mg Transdermal Daily   pantoprazole  40 mg Oral BID   PARoxetine  40 mg Oral Daily   thiamine  100 mg Oral Daily   Or   thiamine  100 mg Intravenous Daily   acetaminophen, benzocaine, busPIRone, famotidine, hydrOXYzine, labetalol, LORazepam, metoCLOPramide (REGLAN) injection, nitroGLYCERIN, ondansetron **OR** ondansetron (ZOFRAN) IV  Assessment/ Plan:  Ms. TEESHA GUNNERSON is a 52 y.o.  female with alcoholic use, hypertension, anxiety, depression, allergies, coronary artery disease, COPD, hypothyroidism, hyperlipemia, and tobacco use, who was admitted to [facility] on 12/24/2022 for NSTEMI (non-ST elevated myocardial infarction) (HCC) [I21.4] AKI (acute kidney injury) (HCC) [N17.9] COPD with acute exacerbation (HCC) [J44.1] QT prolongation [R94.31]    Acute Kidney Injury with hyperkalemia on chronic kidney disease stage IIIB: bland urine. NO obstruction on ultrasound. No IV contrast. ATN from overdiuresis, pancreatitis and prerenal azotemia. Unclear cause for hyperkalemia - holding home potassium chloride - holding Entresto - holding torsemide   Hypertension with chronic kidney disease:  with acute exacerbation of congestive heart failure. Home blood pressure regimen of carvedilol, Entresto, and torsemide.  - Continue carvedilol.    Anemia with chronic kidney disease:  - pending SPEP/UPEP    LOS: 5 Zerina Hallinan 9/15/202410:48 AM

## 2022-12-29 NOTE — Progress Notes (Addendum)
Patient discharged to home, AVS reviewed and all questions answered. Patient will call to schedule follow up appointments with cardiology, nephrology and PCP. Home medications returned from pharmacy. Patient arranged transportation. NT assisted her to the exit.

## 2022-12-30 ENCOUNTER — Ambulatory Visit: Payer: Medicaid Other | Admitting: Family Medicine

## 2022-12-30 ENCOUNTER — Telehealth: Payer: Self-pay | Admitting: *Deleted

## 2022-12-30 DIAGNOSIS — E785 Hyperlipidemia, unspecified: Secondary | ICD-10-CM

## 2022-12-30 DIAGNOSIS — Z5181 Encounter for therapeutic drug level monitoring: Secondary | ICD-10-CM

## 2022-12-30 DIAGNOSIS — F411 Generalized anxiety disorder: Secondary | ICD-10-CM

## 2022-12-30 DIAGNOSIS — I10 Essential (primary) hypertension: Secondary | ICD-10-CM

## 2022-12-30 DIAGNOSIS — F3341 Major depressive disorder, recurrent, in partial remission: Secondary | ICD-10-CM

## 2022-12-30 NOTE — Transitions of Care (Post Inpatient/ED Visit) (Signed)
12/30/2022  Name: Angelica Herrera MRN: [id number] DOB: January 16, 1971  Today's TOC FU Call Status: Today's TOC FU Call Status:: Successful TOC FU Call Completed TOC FU Call Complete Date: 12/30/22 Patient's Name and Date of Birth confirmed.  Transition Care Management Follow-up Telephone Call Date of Discharge: 12/29/22 Discharge Facility: [facility]) Type of Discharge: Inpatient Admission Primary Inpatient Discharge Diagnosis:: NSTEMI How have you been since you were released from the hospital?: Same Any questions or concerns?: Yes Patient Questions/Concerns:: Patient is not feeling well today and would like a second opinion at [facility] Patient Questions/Concerns Addressed: Other: (Advised patient to follow up with PCP, Cardiology and Nephrology)  Items Reviewed: Did you receive and understand the discharge instructions provided?: Yes Medications obtained,verified, and reconciled?: Partial Review Completed Reason for Partial Mediation Review: Patient reported having all of her medications, did not want to review the list of medications Any new allergies since your discharge?: No Do you have support at home?: Yes People in Home: child(ren), adult Name of Support/Comfort Primary Source: Daughter/[name]  Medications Reviewed Today:Patient reports having all medications, but did not feel like reviewing medication list Medications Reviewed Today   Medications were not reviewed in this encounter     Home Care and Equipment/Supplies: Were Home Health Services Ordered?: NA Any new equipment or medical supplies ordered?: NA  Functional Questionnaire: Do you need assistance with bathing/showering or dressing?: No Do you need assistance with meal preparation?: No Do you need assistance with eating?: No Do you have difficulty maintaining continence: No Do you need assistance with getting out of bed/getting out of a chair/moving?: No Do you have difficulty  managing or taking your medications?: No  Follow up appointments reviewed: PCP Follow-up appointment confirmed?: Yes Date of PCP follow-up appointment?: 12/30/22 Follow-up Provider: Danelle Berry, PA. Patient did not feel like keeping her appointment today Specialist Hospital Follow-up appointment confirmed?: No Reason Specialist Follow-Up Not Confirmed: Patient has Specialist Provider Number and will Call for Appointment Do you need transportation to your follow-up appointment?: No Do you understand care options if your condition(s) worsen?: Yes-patient verbalized understanding  SDOH Interventions Today    Flowsheet Row Most Recent Value  SDOH Interventions   Transportation Interventions Intervention Not Indicated       The patient was given information about care management services as a benefit of their Medicaid health plan today.   Patient                                              did not agree to enrollment in care management services and does not wish to consider at this time.   Estanislado Emms RN, BSN [place]  [facility] RN Care Coordinator [phone]

## 2022-12-31 LAB — KAPPA/LAMBDA LIGHT CHAINS
Kappa free light chain: 18.2 mg/L (ref 3.3–19.4)
Kappa, lambda light chain ratio: 1.02 (ref 0.26–1.65)
Lambda free light chains: 17.9 mg/L (ref 5.7–26.3)

## 2022-12-31 LAB — PROTEIN ELECTROPHORESIS, SERUM
A/G Ratio: 1.5 (ref 0.7–1.7)
Albumin ELP: 3.7 g/dL (ref 2.9–4.4)
Alpha-1-Globulin: 0.3 g/dL (ref 0.0–0.4)
Alpha-2-Globulin: 0.6 g/dL (ref 0.4–1.0)
Beta Globulin: 0.9 g/dL (ref 0.7–1.3)
Gamma Globulin: 0.7 g/dL (ref 0.4–1.8)
Globulin, Total: 2.4 g/dL (ref 2.2–3.9)
Total Protein ELP: 6.1 g/dL (ref 6.0–8.5)

## 2023-01-01 ENCOUNTER — Telehealth: Payer: Self-pay | Admitting: Physician Assistant

## 2023-01-01 NOTE — Telephone Encounter (Signed)
Pt scheduled for 7 Oct at 10:55 and BMP order placed

## 2023-01-01 NOTE — Telephone Encounter (Signed)
Pt called c/o chest pain whenever she walks up and down her stairs. Pt describes pain as a heaviness and also stated she feels lightheaded and as if she's going to pass out. Pt stated symptoms resolves with rest and when she's able to caught her breath. Pt also stated she currently has no energy.  Pt reported she was recently admitted and stated she feels she was discharged too early. Pt stated she felt bad and still having pain.  Based on current symptoms, nurse recommended pt return to ER for further evaluations. Pt voiced she will try to go tomorrow. Nurse reiterate the importance of being evaluated Pt verbalized understanding.

## 2023-01-01 NOTE — Telephone Encounter (Signed)
Pt c/o Shortness Of Breath: STAT if SOB developed within the last 24 hours or pt is noticeably SOB on the phone  1. Are you currently SOB (can you hear that pt is SOB on the phone)? No   2. How long have you been experiencing SOB? Week   3. Are you SOB when sitting or when up moving around? Moving around  4. Are you currently experiencing any other symptoms? Chest pain

## 2023-01-01 NOTE — Addendum Note (Signed)
Addended by: Ursula Alert on: 01/01/2023 10:21 AM   Modules accepted: Orders

## 2023-01-14 DIAGNOSIS — Z419 Encounter for procedure for purposes other than remedying health state, unspecified: Secondary | ICD-10-CM | POA: Diagnosis not present

## 2023-01-16 DIAGNOSIS — N179 Acute kidney failure, unspecified: Secondary | ICD-10-CM

## 2023-01-16 DIAGNOSIS — N1832 Chronic kidney disease, stage 3b: Secondary | ICD-10-CM | POA: Insufficient documentation

## 2023-01-16 DIAGNOSIS — E875 Hyperkalemia: Secondary | ICD-10-CM | POA: Insufficient documentation

## 2023-01-16 DIAGNOSIS — I129 Hypertensive chronic kidney disease with stage 1 through stage 4 chronic kidney disease, or unspecified chronic kidney disease: Secondary | ICD-10-CM | POA: Diagnosis not present

## 2023-01-16 DIAGNOSIS — D631 Anemia in chronic kidney disease: Secondary | ICD-10-CM | POA: Insufficient documentation

## 2023-01-16 HISTORY — DX: Acute kidney failure, unspecified: N17.9

## 2023-01-17 NOTE — Progress Notes (Deleted)
Cardiology Office Note    Date:  01/17/2023   ID:  Angelica Herrera, DOB [birth year], MRN [id number]  PCP:  Danelle Berry, PA-C  Cardiologist:  Lorine Bears, MD  Electrophysiologist:  None   Chief Complaint: Hospital follow-up  History of Present Illness:   Angelica Herrera is a 52 y.o. female with history of CAD with anterolateral ST elevation MI in 01/2012 with PTCA/DES to the left main at that time as detailed below complicated by hemodynamic instability and VT, HFrEF secondary to ICM, left renal artery stenosis status post left renal artery stenting in 11/2018 by vascular surgery, CKD stage III, HLD, hypothyroidism, depression, and tobacco use who presents for hospital follow up as outlined below.   She was admitted to the hospital in 01/2012 with an anterolateral STEMI. She underwent emergent cardiac catheterization which showed occlusion of the left main coronary artery. There was significant difficulty engaging the left main, though she ultimately underwent successful PCI/DES. She also had PTCA done to the LAD due to embolization from the left main. Post procedure she had significant hemodynamic instablity with VT and hypotension. She was noted to have severe MR initially, however subsequent echocardiogram showed an EF of 45% with no significant MR. She was lost to follow up from 2013-2017. Upon re-establishing with our group in 2017, she continued to smoke and had been off her medications. In 2017, she had atypical chest pain with a Lexiscan MPI showing prior anterior infarct with mild peri-infarct ischemia and an EF of 33%. She underwent repeat cardiac cath at that time which showed a patent left main stent with moderate mid LAD stenosis and an occluded D2 with faint collaterals. The RCA was very large with mild mid stenosis. EF was 35-40% with anterior wall hypokinesis. Continued medical therapy was advised. She underwent repeat nuclear stress testing in 02/2017 for recurrent chest  pain that showed a similar finding of prior anterior infarct with mild peri-infarct ischemia.  Echo at that time demonstrated an EF of 30 to 35%, diffuse hypokinesis with severe hypokinesis/possible akinesis of the anterior and lateral walls, grade 1 diastolic dysfunction, mild to moderate mitral regurgitation, normal RV systolic function, and mildly elevated PASP.  Most recent echo from 02/2019 demonstrated an EF of 35 to 40%, anterior and septal wall hypokinesis, grade 2 diastolic dysfunction, mildly dilated LV internal cavity size, normal RV systolic function with mildly enlarged ventricular cavity, mildly to moderately dilated left atrium, mildly dilated right atrium, moderately elevated PASP estimated at 51.2 mmHg.   She was seen in the office in 02/2021 and had been out of carvedilol, Entresto, and furosemide.  She was without symptoms of angina or decompensation.  She continued to smoke one fourth a pack of cigarettes per day.  She had gained significant weight over the preceding 6 months.  GDMT was reinitiated.   She was seen in the office in 08/2021 and remained without symptoms of angina or decompensation.  She reported frequently missing her p.m. doses of Entresto and carvedilol.  Blood pressure was well-controlled at home, though BP was elevated in the office at 188/122 which was felt to be in the setting of frequently missing medications and [facility] intake.  She continued to smoke a fourth of a pack of cigarettes per day.  Smoking cessation and medication adherence were recommended.   She was seen in the ED on 05/16/2022 with pain and swelling involving left knee without a known trauma.  Plain film imaging showed a moderate joint effusion  with no acute fracture or dislocation.  She was placed in a hinged knee brace with recommendation to follow-up with orthopedics.   She was seen in the office on 07/04/2022 and had ran out of [place] 3 days prior.  She was taking clonidine 0.1 mg twice daily.   She reported shortness of breath and lower extremity swelling over the past month with a 10 pound weight gain.  She was taking furosemide 20 mg once daily and had even doubled to 40 mg daily for a few days without significant change.  She reported worsening chest pain.  Her weight was up 8 pounds at that visit when compared to her visit in 05/2022.  Sherryll Burger was refilled and she was transition from furosemide to torsemide 40 mg daily.  She was last seen in the office on 11/15/2022 noting improvement in dyspnea and resolution of prior chest pain.  Her weight was down 21 pounds when compared to her visit in 06/2022.  She felt like she was back to baseline.  She had been out of aspirin for several weeks.  She was unclear of her torsemide dose.  She was admitted to the hospital in 12/2022 with pancreatitis in the setting of alcohol use and over eating with admission complicated by acute on CKD stage III and hyperkalemia.  She was noted to have a mildly elevated high-sensitivity troponin peaking at 75, felt to be supply/demand ischemia.  Echo during the admission showed an EF of 35 to 40%, global hypokinesis with severe hypokinesis of the entire anterior and anteroseptal wall, mild LVH, grade 2 diastolic dysfunction, low normal RV systolic function with normal ventricular cavity size, moderate biatrial enlargement, moderate to severe mitral regurgitation, and moderate tricuspid regurgitation.  Renal artery ultrasound during the admission showed upper limit of normal right renal arterial velocities possibly suggestive of 50 to 60% stenosis, with question of stent stenosis in the left renal artery stent (of note, patient underwent renal artery ultrasound in 06/2022 which showed no evidence of renal artery stenosis bilaterally).  ***   Labs independently reviewed: 12/2022 - magnesium 2.6, potassium 4.8, BUN 35, serum creatinine 2.08, albumin 3.7, AST/ALT normal, Hgb 11.7, PLT 189 05/2022 - TSH normal 12/2021 - TC 109, TG  128, HDL 52, LDL 36  01/2021 - N8G 5.5   Past Medical History:  Diagnosis Date   Accelerated hypertension 11/13/2018   Allergic rhinitis due to pollen 06/28/2015   Anxiety with depression 02/18/2020   Arthritis    gout   Chest pain 11/12/2018   Chronic combined systolic (congestive) and diastolic (congestive) heart failure (HCC)    a. 02/2017 Echo: EF 30-35%, diff HK, sev HK/poss AK of ant and lat walls. Gr1 DD. Mild to mod MR. Nl RV fxn. Mod TR.   Chronic obstructive pulmonary disease (HCC) 09/02/2019   tx with trelegy and SABA   Chronic systolic heart failure (HCC) 11/21/2017   CKD (chronic kidney disease), stage IV (HCC)    Controlled substance agreement signed 09/07/2015   Coronary artery disease involving native coronary artery    Degenerative disc disease, cervical 03/17/2015   Depression, recurrent (HCC) 12/02/2017   Essential hypertension, malignant 02/12/2012   GAD (generalized anxiety disorder) 03/17/2015   History of acute anterior wall MI 02/12/2012   Hyperlipidemia    Hypothyroidism    Ischemic cardiomyopathy    a. 02/2012 Echo: EF 40-45%, ant/lat HK, Gr1 DD, Mild MR, PASP ; b. 02/2017 Echo: EF 30-35%.   NSVT (nonsustained ventricular tachycardia) (HCC)  a. 02/2012 post-MI   Obesity    Pancreatitis    Rhinosinusitis 09/02/2019   S/P primary angioplasty with coronary stent 03/04/2018   Dual antiplatelet therapy INDEFINITELY per cardiology note Nov 2019   Squamous cell carcinoma of back 04/20/2019   referrred to sugery for f/up excision, derm not available for several month, lesion with rapid change cannot wait, still see derm for skin survey etc   Stage 3b chronic kidney disease (HCC) 02/18/2020   Tobacco abuse    Tobacco dependence 02/18/2020    Past Surgical History:  Procedure Laterality Date   ABDOMINAL HYSTERECTOMY     BIOPSY  10/30/2022   Procedure: BIOPSY;  Surgeon: Wyline Mood, MD;  Location: [facility] ENDOSCOPY;  Service: Gastroenterology;;   CARDIAC  CATHETERIZATION  2013   [facility] s/p stent   CARDIAC CATHETERIZATION Left 08/03/2015   Procedure: Left Heart Cath and Coronary Angiography;  Surgeon: Iran Ouch, MD;  Location: [facility] INVASIVE CV LAB;  Service: Cardiovascular;  Laterality: Left;   COLONOSCOPY WITH PROPOFOL N/A 10/30/2022   Procedure: COLONOSCOPY WITH PROPOFOL;  Surgeon: Wyline Mood, MD;  Location: [facility] ENDOSCOPY;  Service: Gastroenterology;  Laterality: N/A;   DILATION AND CURETTAGE OF UTERUS     ESOPHAGOGASTRODUODENOSCOPY (EGD) WITH PROPOFOL N/A 10/30/2022   Procedure: ESOPHAGOGASTRODUODENOSCOPY (EGD) WITH PROPOFOL;  Surgeon: Wyline Mood, MD;  Location: [facility] ENDOSCOPY;  Service: Gastroenterology;  Laterality: N/A;   LEFT HEART CATHETERIZATION WITH CORONARY ANGIOGRAM N/A 02/11/2012   Procedure: LEFT HEART CATHETERIZATION WITH CORONARY ANGIOGRAM;  Surgeon: Tonny Bollman, MD;  Location: [facility] CATH LAB;  Service: Cardiovascular;  Laterality: N/A;   POLYPECTOMY  10/30/2022   Procedure: POLYPECTOMY;  Surgeon: Wyline Mood, MD;  Location: [facility] ENDOSCOPY;  Service: Gastroenterology;;   RENAL INTERVENTION N/A 11/17/2018   Procedure: RENAL INTERVENTION;  Surgeon: Renford Dills, MD;  Location: [facility] INVASIVE CV LAB;  Service: Cardiovascular;  Laterality: N/A;    Current Medications: No outpatient medications have been marked as taking for the 01/20/23 encounter (Appointment) with Sondra Barges, PA-C.   Current Facility-Administered Medications for the 01/20/23 encounter (Appointment) with Sondra Barges, PA-C  Medication   mometasone-formoterol (DULERA) 200-5 MCG/ACT inhaler 2 puff    Allergies:   Patient has no known allergies.   Social History   Socioeconomic History   Marital status: Legally Separated    Spouse name: Not on file   Number of children: Not on file   Years of education: Not on file   Highest education level: Not on file  Occupational History   Not on file  Tobacco Use   Smoking status: Every Day    Current  packs/day: 0.25    Average packs/day: 0.3 packs/day for 30.0 years (7.5 ttl pk-yrs)    Types: Cigarettes   Smokeless tobacco: Never  Vaping Use   Vaping status: Never Used  Substance and Sexual Activity   Alcohol use: Not Currently    Comment: Says she previously drank socially - never a heavy drinker.  No etoh since 08/2018.   Drug use: No    Types: Marijuana   Sexual activity: Not on file  Other Topics Concern   Not on file  Social History Narrative   Lives in [place] w/ her dtr.  Does not routinely exercise.  Disabled/doesn't work.   Social Determinants of Health   Financial Resource Strain: Not on file  Food Insecurity: No Food Insecurity (12/24/2022)   Hunger Vital Sign    Worried About Running Out of Food in the Last Year:  Never true    Ran Out of Food in the Last Year: Never true  Transportation Needs: No Transportation Needs (12/30/2022)   PRAPARE - Administrator, Civil Service (Medical): No    Lack of Transportation (Non-Medical): No  Physical Activity: Not on file  Stress: Not on file  Social Connections: Not on file     Family History:  The patient's family history includes AAA (abdominal aortic aneurysm) in her father; Cancer in her paternal grandfather; Diabetes in her mother; Hyperlipidemia in her mother; Hypertension in her brother, brother, daughter, father, mother, sister, and sister; Kidney disease in her mother; Supraventricular tachycardia in her sister; Thyroid disease in her father.  ROS:   12-point review of systems is negative unless otherwise noted in the HPI.   EKGs/Labs/Other Studies Reviewed:    Studies reviewed were summarized above. The additional studies were reviewed today:  Renal artery ultrasound 12/27/2022: IMPRESSION: 1. Arterial velocities near the upper limit of normal are noted at the origin of the right renal artery, which could suggest stenosis approaching 50-60%. 2. The patient has a known left renal artery stent. There  are slightly elevated velocities noted in the mid left renal artery, which could suggest an element of early in-stent stenosis. This is somewhat further supported by borderline low resistive indices measured in the arcuate arteries. These findings could be further evaluated with CT angiography if clinically appropriate. __________  2D echo 12/27/2022: 1. Left ventricular ejection fraction, by estimation, is 35 to 40%. Left  ventricular ejection fraction by 2D MOD biplane is 41.7 %. Left  ventricular ejection fraction by PLAX is 39 %. The left ventricle has  moderately decreased function. The left  ventricle demonstrates global hypokinesis. There is mild left ventricular  hypertrophy. Left ventricular diastolic parameters are consistent with  Grade II diastolic dysfunction (pseudonormalization). There is severe  hypokinesis of the left ventricular,  entire anterior wall and anteroseptal wall.   2. Right ventricular systolic function is low normal. The right  ventricular size is normal.   3. Left atrial size was moderately dilated.   4. Right atrial size was moderately dilated.   5. The mitral valve is normal in structure. Moderate to severe mitral  valve regurgitation.   6. Tricuspid valve regurgitation is moderate.   7. The aortic valve is grossly normal. Aortic valve regurgitation is not  visualized.  __________  Renal artery ultrasound 07/02/2022: Summary:  Largest Aortic Diameter: 2.4 cm    Renal:    Right: Normal size right kidney. No evidence of right renal artery         stenosis. Normal right Resisitive Index. Normal cortical         thickness of right kidney. RRV flow present.  Left:  Normal size of left kidney. No evidence of left renal artery         stenosis. LRV flow present. Normal left Resistive Index.         Normal cortical thickness of the left kidney.  Mesenteric:  Normal Celiac artery and Superior Mesenteric artery findings.  __________   2D echo  02/19/2019:  1. Left ventricular ejection fraction, by visual estimation, is 35 to  40%. The left ventricle has moderately decreased function. There is no  left ventricular hypertrophy. Anterior and Septal wall hypokinesis.   2. Left ventricular diastolic parameters are consistent with Grade II  diastolic dysfunction (pseudonormalization).   3. Mildly dilated left ventricular internal cavity size.   4. Global right ventricle has  normal systolic function.The right  ventricular size is mildly enlarged. No increase in right ventricular wall  thickness.   5. Left atrial size was mild-moderately dilated.   6. Right atrial size was mildly dilated.   7. Moderately elevated pulmonary artery systolic pressure.   8. The tricuspid regurgitant velocity is 2.80 m/s, and with an assumed  right atrial pressure of 20 mmHg, the estimated right ventricular systolic  pressure is moderately elevated at 51.2 mmHg.   9. Small pericardial effusion. __________   2D echo 02/21/2017: - Left ventricle: The cavity size was normal. Systolic function was    moderately to severely reduced. The estimated ejection fraction    was in the range of 30% to 35%. Diffuse hypokinesis with severe    hypokinesis /possible akinesis of the anterior and lateral walls.    Doppler parameters are consistent with abnormal left ventricular    relaxation (grade 1 diastolic dysfunction).  - Mitral valve: There was mild to moderate regurgitation.  - Left atrium: The atrium was normal in size.  - Right ventricle: Systolic function was normal.  - Tricuspid valve: There was moderate regurgitation.  - Pulmonary arteries: Systolic pressure was mildly elevated. __________   Eugenie Birks MPI 02/14/2017: There was no ST segment deviation noted during stress. No T wave inversion was noted during stress. Defect 1: There is a medium defect of severe severity present in the basal anterior, basal anterolateral, mid anterior and mid anterolateral  location. This is an intermediate risk study. The left ventricular ejection fraction is moderately decreased (30-44%). Findings consistent with prior myocardial infarction with mild peri-infarct ischemia. __________   2D echo 10/06/2015: - Left ventricle: The cavity size was normal. Systolic function was    moderately reduced. The estimated ejection fraction was in the    range of 35% to 40%. Hypokinesis of the anterior myocardium.    Hypokinesis of the anteroseptal myocardium. Doppler parameters    are consistent with abnormal left ventricular relaxation (grade 1    diastolic dysfunction).  - Mitral valve: Calcified annulus. There was mild regurgitation.  - Left atrium: The atrium was normal in size.  - Right ventricle: Systolic function was normal.  - Pulmonary arteries: Systolic pressure was within the normal    range. ___________   LHC 08/03/2015: Ost LM to LM lesion, 10% stenosed. The lesion was previously treated with a stent (unknown type). Mid LAD lesion, 60% stenosed. Mid RCA lesion, 30% stenosed. There is moderate left ventricular systolic dysfunction.   1. Widely patent ostial left main stent with moderate mid LAD stenosis and occluded second diagonal with faint collaterals. The right coronary artery is very large with mild mid stenosis. 2. Moderately reduced LV systolic function with an ejection fraction of 35-40% with significant anterior wall hypokinesis. Mildly elevated left ventricular end-diastolic pressure. 3. Significant radial artery spasm which required increased amounts of sedation.   Recommendations: Continue medical therapy for coronary artery disease and chronic systolic heart failure. We will need to transition her from clonidine to an ACE inhibitor or ARB with close monitoring of renal function. __________   Eugenie Birks MPI 07/28/2015: Pharmacological myocardial perfusion imaging study with attenuation corrected images suggesting mild anterior wall ischemia,  mid to apical region Nonattenuation corrected images also with mild mid to apical anterior wall ischemia Patient attempted exercise on bruce protocol though unable to reach target heart rate, changed to lexiscan Anteroseptal wall hypokinesis noted, EF estimated at 33% No EKG changes concerning for ischemia at peak stress or in recovery.  Moderate risk scan with mild ischemia as above __________   2D echo 02/12/2012: - Left ventricle: The cavity size was normal. Wall thickness    was increased in a pattern of mild LVH. Systolic function    was mildly to moderately reduced. The estimated ejection    fraction was in the range of 40% to 45%. Hypokinesis of    the entireanterior and lateral myocardium. Doppler    parameters are consistent with abnormal left ventricular    relaxation (grade 1 diastolic dysfunction).  - Mitral valve: Mild regurgitation.  - Pulmonary arteries: PA peak pressure: 43mm Hg (S).   EKG:  EKG is ordered today.  The EKG ordered today demonstrates ***  Recent Labs: 05/28/2022: TSH 1.88 12/27/2022: Hemoglobin 11.7; Platelets 189 12/28/2022: ALT 20 12/29/2022: BUN 35; Creatinine, Ser 2.08; Magnesium 2.6; Potassium 4.8; Sodium 135  Recent Lipid Panel    Component Value Date/Time   CHOL 109 12/27/2021 1506   CHOL 136 02/27/2018 1207   TRIG 128 12/27/2021 1506   HDL 52 12/27/2021 1506   HDL 53 02/27/2018 1207   CHOLHDL 2.1 12/27/2021 1506   VLDL 15 11/13/2018 0628   LDLCALC 36 12/27/2021 1506    PHYSICAL EXAM:    VS:  There were no vitals taken for this visit.  BMI: There is no height or weight on file to calculate BMI.  Physical Exam  Wt Readings from Last 3 Encounters:  12/24/22 179 lb 0.2 oz (81.2 kg)  11/15/22 179 lb (81.2 kg)  10/30/22 172 lb 12.8 oz (78.4 kg)     ASSESSMENT & PLAN:   CAD involving native coronary arteries with stable angina:  HFrEF secondary to ICM:  Mitral and tricuspid regurgitation:  HTN: Blood pressure  HLD: LDL  36.  CKD stage IIIb:  Renal artery stenosis:  Tobacco/alcohol use:   {Are you ordering a CV Procedure (e.g. stress test, cath, DCCV, TEE, etc)?   Press F2        :[id number]}     Disposition: F/u with Dr. Kirke Corin or an APP in ***.   Medication Adjustments/Labs and Tests Ordered: Current medicines are reviewed at length with the patient today.  Concerns regarding medicines are outlined above. Medication changes, Labs and Tests ordered today are summarized above and listed in the Patient Instructions accessible in Encounters.   Signed, Eula Listen, PA-C 01/17/2023 9:02 AM     [facility] [place] [address] [place], [place] [address] [phone]

## 2023-01-20 ENCOUNTER — Ambulatory Visit: Payer: Medicaid Other | Admitting: Physician Assistant

## 2023-02-14 DIAGNOSIS — Z419 Encounter for procedure for purposes other than remedying health state, unspecified: Secondary | ICD-10-CM | POA: Diagnosis not present

## 2023-02-14 NOTE — Progress Notes (Deleted)
Cardiology Office Note    Date:  02/14/2023   ID:  Angelica Herrera, DOB 20-Feb-1971, MRN [id number]  PCP:  Danelle Berry, PA-C  Cardiologist:  Lorine Bears, MD  Electrophysiologist:  None   Chief Complaint: Hospital follow up  History of Present Illness:   Angelica Herrera is a 52 y.o. female with history of CAD with anterolateral ST elevation MI in 01/2012 with PTCA/DES to the left main at that time as detailed below complicated by hemodynamic instability and VT, HFrEF secondary to ICM, left renal artery stenosis status post left renal artery stenting in 11/2018 by vascular surgery, CKD stage III, HLD, hypothyroidism, depression, and tobacco use who presents for hospital follow up as outlined below.   She was admitted to the hospital in 01/2012 with an anterolateral STEMI. She underwent emergent cardiac catheterization which showed occlusion of the left main coronary artery. There was significant difficulty engaging the left main, though she ultimately underwent successful PCI/DES. She also had PTCA done to the LAD due to embolization from the left main. Post procedure she had significant hemodynamic instablity with VT and hypotension. She was noted to have severe MR initially, however subsequent echocardiogram showed an EF of 45% with no significant MR. She was lost to follow up from 2013-2017. Upon re-establishing with our group in 2017, she continued to smoke and had been off her medications. In 2017, she had atypical chest pain with a Lexiscan MPI showing prior anterior infarct with mild peri-infarct ischemia and an EF of 33%. She underwent repeat cardiac cath at that time which showed a patent left main stent with moderate mid LAD stenosis and an occluded D2 with faint collaterals. The RCA was very large with mild mid stenosis. EF was 35-40% with anterior wall hypokinesis. Continued medical therapy was advised. She underwent repeat nuclear stress testing in 02/2017 for recurrent chest  pain that showed a similar finding of prior anterior infarct with mild peri-infarct ischemia.  Echo at that time demonstrated an EF of 30 to 35%, diffuse hypokinesis with severe hypokinesis/possible akinesis of the anterior and lateral walls, grade 1 diastolic dysfunction, mild to moderate mitral regurgitation, normal RV systolic function, and mildly elevated PASP.  Most recent echo from 02/2019 demonstrated an EF of 35 to 40%, anterior and septal wall hypokinesis, grade 2 diastolic dysfunction, mildly dilated LV internal cavity size, normal RV systolic function with mildly enlarged ventricular cavity, mildly to moderately dilated left atrium, mildly dilated right atrium, moderately elevated PASP estimated at 51.2 mmHg.   She was seen in the office in 02/2021 and had been out of carvedilol, Entresto, and furosemide.  She was without symptoms of angina or decompensation.  She continued to smoke one fourth a pack of cigarettes per day.  She had gained significant weight over the preceding 6 months.  GDMT was reinitiated.   She was seen in the office in 08/2021 and remained without symptoms of angina or decompensation.  She reported frequently missing her p.m. doses of Entresto and carvedilol.  Blood pressure was well-controlled at home, though BP was elevated in the office at 188/122 which was felt to be in the setting of frequently missing medications and [facility] intake.  She continued to smoke a fourth of a pack of cigarettes per day.  Smoking cessation and medication adherence were recommended.   She was seen in the ED on 05/16/2022 with pain and swelling involving left knee without a known trauma.  Plain film imaging showed a moderate joint  effusion with no acute fracture or dislocation.  She was placed in a hinged knee brace with recommendation to follow-up with orthopedics.   She was seen in the office on 07/04/2022 and had ran out of [place] 3 days prior.  She was taking clonidine 0.1 mg twice daily.   She reported shortness of breath and lower extremity swelling over the past month with a 10 pound weight gain.  She was taking furosemide 20 mg once daily and had even doubled to 40 mg daily for a few days without significant change.  She reported worsening chest pain.  Her weight was up 8 pounds at that visit when compared to her visit in 05/2022.  Sherryll Burger was refilled and she was transition from furosemide to torsemide 40 mg daily.  She was last seen in the office on 11/15/2022 noting improvement in dyspnea and resolution of prior chest pain.  Her weight was down 21 pounds when compared to her visit in 06/2022.  She felt like she was back to baseline.  She had been out of aspirin for several weeks.  She was unclear of her torsemide dose.  She was admitted to the hospital in 12/2022 with pancreatitis in the setting of alcohol use and over eating with admission complicated by acute on CKD stage III and hyperkalemia.  She was noted to have a mildly elevated high-sensitivity troponin peaking at 75, felt to be supply/demand ischemia.  Echo during the admission showed an EF of 35 to 40%, global hypokinesis with severe hypokinesis of the entire anterior and anteroseptal wall, mild LVH, grade 2 diastolic dysfunction, low normal RV systolic function with normal ventricular cavity size, moderate biatrial enlargement, moderate to severe mitral regurgitation, and moderate tricuspid regurgitation.  Renal artery ultrasound during the admission showed upper limit of normal right renal arterial velocities possibly suggestive of 50 to 60% stenosis, with question of stent stenosis in the left renal artery stent (of note, patient underwent renal artery ultrasound in 06/2022 which showed no evidence of renal artery stenosis bilaterally).  ***   Labs independently reviewed: 12/2022 - magnesium 2.6, potassium 4.8, BUN 35, serum creatinine 2.08, albumin 3.7, AST/ALT normal, Hgb 11.7, PLT 189 05/2022 - TSH normal 12/2021 - TC 109, TG  128, HDL 52, LDL 36  01/2021 - Z6X 5.5    Past Medical History:  Diagnosis Date   Accelerated hypertension 11/13/2018   Allergic rhinitis due to pollen 06/28/2015   Anxiety with depression 02/18/2020   Arthritis    gout   Chest pain 11/12/2018   Chronic combined systolic (congestive) and diastolic (congestive) heart failure (HCC)    a. 02/2017 Echo: EF 30-35%, diff HK, sev HK/poss AK of ant and lat walls. Gr1 DD. Mild to mod MR. Nl RV fxn. Mod TR.   Chronic obstructive pulmonary disease (HCC) 09/02/2019   tx with trelegy and SABA   Chronic systolic heart failure (HCC) 11/21/2017   CKD (chronic kidney disease), stage IV (HCC)    Controlled substance agreement signed 09/07/2015   Coronary artery disease involving native coronary artery    Degenerative disc disease, cervical 03/17/2015   Depression, recurrent (HCC) 12/02/2017   Essential hypertension, malignant 02/12/2012   GAD (generalized anxiety disorder) 03/17/2015   History of acute anterior wall MI 02/12/2012   Hyperlipidemia    Hypothyroidism    Ischemic cardiomyopathy    a. 02/2012 Echo: EF 40-45%, ant/lat HK, Gr1 DD, Mild MR, PASP ; b. 02/2017 Echo: EF 30-35%.   NSVT (nonsustained ventricular tachycardia) (HCC)  a. 02/2012 post-MI   Obesity    Pancreatitis    Rhinosinusitis 09/02/2019   S/P primary angioplasty with coronary stent 03/04/2018   Dual antiplatelet therapy INDEFINITELY per cardiology note Nov 2019   Squamous cell carcinoma of back 04/20/2019   referrred to sugery for f/up excision, derm not available for several month, lesion with rapid change cannot wait, still see derm for skin survey etc   Stage 3b chronic kidney disease (HCC) 02/18/2020   Tobacco abuse    Tobacco dependence 02/18/2020    Past Surgical History:  Procedure Laterality Date   ABDOMINAL HYSTERECTOMY     BIOPSY  10/30/2022   Procedure: BIOPSY;  Surgeon: Wyline Mood, MD;  Location: [facility] ENDOSCOPY;  Service: Gastroenterology;;   CARDIAC  CATHETERIZATION  2013   [facility] s/p stent   CARDIAC CATHETERIZATION Left 08/03/2015   Procedure: Left Heart Cath and Coronary Angiography;  Surgeon: Iran Ouch, MD;  Location: [facility] INVASIVE CV LAB;  Service: Cardiovascular;  Laterality: Left;   COLONOSCOPY WITH PROPOFOL N/A 10/30/2022   Procedure: COLONOSCOPY WITH PROPOFOL;  Surgeon: Wyline Mood, MD;  Location: [facility] ENDOSCOPY;  Service: Gastroenterology;  Laterality: N/A;   DILATION AND CURETTAGE OF UTERUS     ESOPHAGOGASTRODUODENOSCOPY (EGD) WITH PROPOFOL N/A 10/30/2022   Procedure: ESOPHAGOGASTRODUODENOSCOPY (EGD) WITH PROPOFOL;  Surgeon: Wyline Mood, MD;  Location: [facility] ENDOSCOPY;  Service: Gastroenterology;  Laterality: N/A;   LEFT HEART CATHETERIZATION WITH CORONARY ANGIOGRAM N/A 02/11/2012   Procedure: LEFT HEART CATHETERIZATION WITH CORONARY ANGIOGRAM;  Surgeon: Tonny Bollman, MD;  Location: [facility] CATH LAB;  Service: Cardiovascular;  Laterality: N/A;   POLYPECTOMY  10/30/2022   Procedure: POLYPECTOMY;  Surgeon: Wyline Mood, MD;  Location: [facility] ENDOSCOPY;  Service: Gastroenterology;;   RENAL INTERVENTION N/A 11/17/2018   Procedure: RENAL INTERVENTION;  Surgeon: Renford Dills, MD;  Location: [facility] INVASIVE CV LAB;  Service: Cardiovascular;  Laterality: N/A;    Current Medications: No outpatient medications have been marked as taking for the 02/18/23 encounter (Appointment) with Sondra Barges, PA-C.   Current Facility-Administered Medications for the 02/18/23 encounter (Appointment) with Sondra Barges, PA-C  Medication   mometasone-formoterol (DULERA) 200-5 MCG/ACT inhaler 2 puff    Allergies:   Patient has no known allergies.   Social History   Socioeconomic History   Marital status: Legally Separated    Spouse name: Not on file   Number of children: Not on file   Years of education: Not on file   Highest education level: Not on file  Occupational History   Not on file  Tobacco Use   Smoking status: Every Day    Current  packs/day: 0.25    Average packs/day: 0.3 packs/day for 30.0 years (7.5 ttl pk-yrs)    Types: Cigarettes   Smokeless tobacco: Never  Vaping Use   Vaping status: Never Used  Substance and Sexual Activity   Alcohol use: Not Currently    Comment: Says she previously drank socially - never a heavy drinker.  No etoh since 08/2018.   Drug use: No    Types: Marijuana   Sexual activity: Not on file  Other Topics Concern   Not on file  Social History Narrative   Lives in [place] w/ her dtr.  Does not routinely exercise.  Disabled/doesn't work.   Social Determinants of Health   Financial Resource Strain: Not on file  Food Insecurity: No Food Insecurity (12/24/2022)   Hunger Vital Sign    Worried About Running Out of Food in the Last Year:  Never true    Ran Out of Food in the Last Year: Never true  Transportation Needs: No Transportation Needs (12/30/2022)   PRAPARE - Administrator, Civil Service (Medical): No    Lack of Transportation (Non-Medical): No  Physical Activity: Not on file  Stress: Not on file  Social Connections: Not on file     Family History:  The patient's family history includes AAA (abdominal aortic aneurysm) in her father; Cancer in her paternal grandfather; Diabetes in her mother; Hyperlipidemia in her mother; Hypertension in her brother, brother, daughter, father, mother, sister, and sister; Kidney disease in her mother; Supraventricular tachycardia in her sister; Thyroid disease in her father.  ROS:   12-point review of systems is negative unless otherwise noted in the HPI.   EKGs/Labs/Other Studies Reviewed:    Studies reviewed were summarized above. The additional studies were reviewed today:  Renal artery ultrasound 12/27/2022: IMPRESSION: 1. Arterial velocities near the upper limit of normal are noted at the origin of the right renal artery, which could suggest stenosis approaching 50-60%. 2. The patient has a known left renal artery stent. There  are slightly elevated velocities noted in the mid left renal artery, which could suggest an element of early in-stent stenosis. This is somewhat further supported by borderline low resistive indices measured in the arcuate arteries. These findings could be further evaluated with CT angiography if clinically appropriate. __________  2D echo 12/27/2022: 1. Left ventricular ejection fraction, by estimation, is 35 to 40%. Left  ventricular ejection fraction by 2D MOD biplane is 41.7 %. Left  ventricular ejection fraction by PLAX is 39 %. The left ventricle has  moderately decreased function. The left  ventricle demonstrates global hypokinesis. There is mild left ventricular  hypertrophy. Left ventricular diastolic parameters are consistent with  Grade II diastolic dysfunction (pseudonormalization). There is severe  hypokinesis of the left ventricular,  entire anterior wall and anteroseptal wall.   2. Right ventricular systolic function is low normal. The right  ventricular size is normal.   3. Left atrial size was moderately dilated.   4. Right atrial size was moderately dilated.   5. The mitral valve is normal in structure. Moderate to severe mitral  valve regurgitation.   6. Tricuspid valve regurgitation is moderate.   7. The aortic valve is grossly normal. Aortic valve regurgitation is not  visualized.  __________  Renal artery ultrasound 07/02/2022: Summary:  Largest Aortic Diameter: 2.4 cm    Renal:    Right: Normal size right kidney. No evidence of right renal artery         stenosis. Normal right Resisitive Index. Normal cortical         thickness of right kidney. RRV flow present.  Left:  Normal size of left kidney. No evidence of left renal artery         stenosis. LRV flow present. Normal left Resistive Index.         Normal cortical thickness of the left kidney.  Mesenteric:  Normal Celiac artery and Superior Mesenteric artery findings.  __________   2D echo  02/19/2019:  1. Left ventricular ejection fraction, by visual estimation, is 35 to  40%. The left ventricle has moderately decreased function. There is no  left ventricular hypertrophy. Anterior and Septal wall hypokinesis.   2. Left ventricular diastolic parameters are consistent with Grade II  diastolic dysfunction (pseudonormalization).   3. Mildly dilated left ventricular internal cavity size.   4. Global right ventricle has  normal systolic function.The right  ventricular size is mildly enlarged. No increase in right ventricular wall  thickness.   5. Left atrial size was mild-moderately dilated.   6. Right atrial size was mildly dilated.   7. Moderately elevated pulmonary artery systolic pressure.   8. The tricuspid regurgitant velocity is 2.80 m/s, and with an assumed  right atrial pressure of 20 mmHg, the estimated right ventricular systolic  pressure is moderately elevated at 51.2 mmHg.   9. Small pericardial effusion. __________   2D echo 02/21/2017: - Left ventricle: The cavity size was normal. Systolic function was    moderately to severely reduced. The estimated ejection fraction    was in the range of 30% to 35%. Diffuse hypokinesis with severe    hypokinesis /possible akinesis of the anterior and lateral walls.    Doppler parameters are consistent with abnormal left ventricular    relaxation (grade 1 diastolic dysfunction).  - Mitral valve: There was mild to moderate regurgitation.  - Left atrium: The atrium was normal in size.  - Right ventricle: Systolic function was normal.  - Tricuspid valve: There was moderate regurgitation.  - Pulmonary arteries: Systolic pressure was mildly elevated. __________   Eugenie Birks MPI 02/14/2017: There was no ST segment deviation noted during stress. No T wave inversion was noted during stress. Defect 1: There is a medium defect of severe severity present in the basal anterior, basal anterolateral, mid anterior and mid anterolateral  location. This is an intermediate risk study. The left ventricular ejection fraction is moderately decreased (30-44%). Findings consistent with prior myocardial infarction with mild peri-infarct ischemia. __________   2D echo 10/06/2015: - Left ventricle: The cavity size was normal. Systolic function was    moderately reduced. The estimated ejection fraction was in the    range of 35% to 40%. Hypokinesis of the anterior myocardium.    Hypokinesis of the anteroseptal myocardium. Doppler parameters    are consistent with abnormal left ventricular relaxation (grade 1    diastolic dysfunction).  - Mitral valve: Calcified annulus. There was mild regurgitation.  - Left atrium: The atrium was normal in size.  - Right ventricle: Systolic function was normal.  - Pulmonary arteries: Systolic pressure was within the normal    range. ___________   LHC 08/03/2015: Ost LM to LM lesion, 10% stenosed. The lesion was previously treated with a stent (unknown type). Mid LAD lesion, 60% stenosed. Mid RCA lesion, 30% stenosed. There is moderate left ventricular systolic dysfunction.   1. Widely patent ostial left main stent with moderate mid LAD stenosis and occluded second diagonal with faint collaterals. The right coronary artery is very large with mild mid stenosis. 2. Moderately reduced LV systolic function with an ejection fraction of 35-40% with significant anterior wall hypokinesis. Mildly elevated left ventricular end-diastolic pressure. 3. Significant radial artery spasm which required increased amounts of sedation.   Recommendations: Continue medical therapy for coronary artery disease and chronic systolic heart failure. We will need to transition her from clonidine to an ACE inhibitor or ARB with close monitoring of renal function. __________   Eugenie Birks MPI 07/28/2015: Pharmacological myocardial perfusion imaging study with attenuation corrected images suggesting mild anterior wall ischemia,  mid to apical region Nonattenuation corrected images also with mild mid to apical anterior wall ischemia Patient attempted exercise on bruce protocol though unable to reach target heart rate, changed to lexiscan Anteroseptal wall hypokinesis noted, EF estimated at 33% No EKG changes concerning for ischemia at peak stress or in recovery.  Moderate risk scan with mild ischemia as above __________   2D echo 02/12/2012: - Left ventricle: The cavity size was normal. Wall thickness    was increased in a pattern of mild LVH. Systolic function    was mildly to moderately reduced. The estimated ejection    fraction was in the range of 40% to 45%. Hypokinesis of    the entireanterior and lateral myocardium. Doppler    parameters are consistent with abnormal left ventricular    relaxation (grade 1 diastolic dysfunction).  - Mitral valve: Mild regurgitation.  - Pulmonary arteries: PA peak pressure: 43mm Hg (S).   EKG:  EKG is ordered today.  The EKG ordered today demonstrates ***  Recent Labs: 05/28/2022: TSH 1.88 12/27/2022: Hemoglobin 11.7; Platelets 189 12/28/2022: ALT 20 12/29/2022: BUN 35; Creatinine, Ser 2.08; Magnesium 2.6; Potassium 4.8; Sodium 135  Recent Lipid Panel    Component Value Date/Time   CHOL 109 12/27/2021 1506   CHOL 136 02/27/2018 1207   TRIG 128 12/27/2021 1506   HDL 52 12/27/2021 1506   HDL 53 02/27/2018 1207   CHOLHDL 2.1 12/27/2021 1506   VLDL 15 11/13/2018 0628   LDLCALC 36 12/27/2021 1506    PHYSICAL EXAM:    VS:  There were no vitals taken for this visit.  BMI: There is no height or weight on file to calculate BMI.  Physical Exam  Wt Readings from Last 3 Encounters:  12/24/22 179 lb 0.2 oz (81.2 kg)  11/15/22 179 lb (81.2 kg)  10/30/22 172 lb 12.8 oz (78.4 kg)     ASSESSMENT & PLAN:   CAD involving native coronary arteries with stable angina:  HFrEF secondary to ICM:  Mitral and tricuspid regurgitation:  HTN: Blood pressure  HLD: LDL  36.  CKD stage IIIb:  Renal artery stenosis:  Tobacco/alcohol use:   {Are you ordering a CV Procedure (e.g. stress test, cath, DCCV, TEE, etc)?   Press F2        :[id number]}     Disposition: F/u with Dr. Kirke Corin or an APP in ***.   Medication Adjustments/Labs and Tests Ordered: Current medicines are reviewed at length with the patient today.  Concerns regarding medicines are outlined above. Medication changes, Labs and Tests ordered today are summarized above and listed in the Patient Instructions accessible in Encounters.   Signed, Eula Listen, PA-C 02/14/2023 10:14 AM     [facility] [place] [address] [place], [place] [address] [phone]

## 2023-02-17 ENCOUNTER — Ambulatory Visit: Payer: Self-pay

## 2023-02-17 ENCOUNTER — Ambulatory Visit (INDEPENDENT_AMBULATORY_CARE_PROVIDER_SITE_OTHER): Payer: Medicaid Other | Admitting: Physician Assistant

## 2023-02-17 ENCOUNTER — Encounter: Payer: Self-pay | Admitting: Physician Assistant

## 2023-02-17 VITALS — BP 150/110 | HR 80 | Temp 97.6°F | Resp 18 | Ht 67.0 in | Wt 184.3 lb

## 2023-02-17 DIAGNOSIS — M109 Gout, unspecified: Secondary | ICD-10-CM | POA: Insufficient documentation

## 2023-02-17 DIAGNOSIS — M1A09X0 Idiopathic chronic gout, multiple sites, without tophus (tophi): Secondary | ICD-10-CM | POA: Insufficient documentation

## 2023-02-17 MED ORDER — COLCHICINE 0.6 MG PO TABS
0.6000 mg | ORAL_TABLET | Freq: Every day | ORAL | 0 refills | Status: DC
Start: 2023-02-17 — End: 2023-04-01

## 2023-02-17 MED ORDER — METHYLPREDNISOLONE 4 MG PO TBPK
ORAL_TABLET | ORAL | 0 refills | Status: DC
Start: 2023-02-17 — End: 2023-03-20

## 2023-02-17 NOTE — Assessment & Plan Note (Signed)
Acute, recurrent per patient  She reports pain in both ankles and metatarsals that is not responding to home measures of Tylenol, ice and elevation Symptoms and Physical exam appear most consistent with acute gout flare Will provide short course of Colchicine 0.6 mg PO every day PRN for management along with Medrol dose pack to assist with inflammation Reviewed low purine diet with her and recommend she stays well hydrated while using Colchicine  She denies previous issues with taking Colchicine and Carvedilol in the past Reviewed most recent Nephro notes- eGFR is 42 - dosage adjustment not indicated for flare per UTD Follow up as needed for persistent or progressing symptoms

## 2023-02-17 NOTE — Telephone Encounter (Signed)
Pt has an appt with Erin at 11:20

## 2023-02-17 NOTE — Assessment & Plan Note (Signed)
>>  ASSESSMENT AND PLAN FOR ACUTE GOUT OF LEFT KNEE WRITTEN ON 02/17/2023 11:47 AM BY MECUM, ERIN E, PA-C  Acute, recurrent per patient  She reports pain in both ankles and metatarsals that is not responding to home measures of Tylenol , ice and elevation Symptoms and Physical exam appear most consistent with acute gout flare Will provide short course of Colchicine  0.6 mg PO every day PRN for management along with Medrol  dose pack to assist with inflammation Reviewed low purine diet with her and recommend she stays well hydrated while using Colchicine   She denies previous issues with taking Colchicine  and Carvedilol  in the past Reviewed most recent Nephro notes- eGFR is 42 - dosage adjustment not indicated for flare per UTD Follow up as needed for persistent or progressing symptoms

## 2023-02-17 NOTE — Progress Notes (Signed)
Acute Office Visit   Patient: Angelica Herrera   DOB: Aug 19, 1970   52 y.o. Female  MRN: [id number] Visit Date: 02/17/2023  Today's healthcare provider: Oswaldo Conroy Garnett Nunziata, PA-C  Introduced myself to the patient as a Secondary school teacher and provided education on APPs in clinical practice.    Chief Complaint  Patient presents with   Gout    X 3 days per patient   Subjective    HPI HPI     Gout    Additional comments: X 3 days per patient      Last edited by Benay Pike, CMA on 02/17/2023 11:10 AM.       Patient reports concerns for gout  She reports this has been ongoing for about 3 days  She states both feet hurt along the arches with radiation along distal aspect of the ankle joint She reports pain is similar to when she has had gout flares in her big toes  She has tried taking Tylenol for the pain but this has not provided much relief. She has tried using ice and elevation as well but this has not provided relief She reports pain is 10/10 currently and she is concerned for mild swelling to the area  She reports she has been out of her Entresto medication for 2 days - has not gone to pharmacy for pick up but plans to go today      Medications: Outpatient Medications Prior to Visit  Medication Sig Note   acetaminophen (TYLENOL) 500 MG tablet Take 1,000 mg by mouth every 6 (six) hours as needed.    aspirin 81 MG tablet Take 1 tablet (81 mg total) by mouth daily.    atorvastatin (LIPITOR) 80 MG tablet Take 1 tablet (80 mg total) by mouth at bedtime.    busPIRone (BUSPAR) 5 MG tablet TAKE 1 TO 3 TABLETS BY MOUTH TWICE DAILY AS NEEDED    carvedilol (COREG) 25 MG tablet TAKE (1) TABLET BY MOUTH TWICE DAILY (Patient taking differently: Take 25 mg by mouth 2 (two) times daily with a meal.)    cetirizine (ZYRTEC) 10 MG tablet TAKE (1) TABLET BY MOUTH  AT BEDTIME    clopidogrel (PLAVIX) 75 MG tablet TAKE (1) TABLET BY MOUTH EVERY DAY    colchicine 0.6 MG tablet Take 1 tablet (0.6  mg total) by mouth daily as needed (gout).    famotidine (PEPCID) 20 MG tablet Take 1 tablet (20 mg total) by mouth 2 (two) times daily as needed for heartburn or indigestion.    gabapentin (NEURONTIN) 300 MG capsule Take 1 capsule (300 mg total) by mouth 2 (two) times daily.    levothyroxine (SYNTHROID) 125 MCG tablet TAKE (1) TABLET BY MOUTH EVERY MORNING BEFORE BREAKFAST    nitroGLYCERIN (NITROSTAT) 0.4 MG SL tablet Place 1 tablet (0.4 mg total) under the tongue every 5 (five) minutes x 3 doses as needed for chest pain.    PARoxetine (PAXIL) 40 MG tablet TAKE (1) TABLET BY MOUTH EVERY DAY    torsemide (DEMADEX) 20 MG tablet Take by mouth.    Na Sulfate-K Sulfate-Mg Sulf 17.5-3.13-1.6 GM/177ML SOLN Take by mouth. (Patient not taking: Reported on 12/24/2022)    nicotine (NICODERM CQ - DOSED IN MG/24 HR) 7 mg/24hr patch Place 1 patch (7 mg total) onto the skin daily. (Patient not taking: Reported on 12/24/2022)    ondansetron (ZOFRAN-ODT) 4 MG disintegrating tablet Take 1 tablet (4 mg total) by mouth every  8 (eight) hours as needed for nausea or vomiting. (Patient not taking: Reported on 12/24/2022)    pantoprazole (PROTONIX) 40 MG tablet Take 1 tablet (40 mg total) by mouth 2 (two) times daily. Empty stomach - take more than 4 hours after taking levothyroxine (Patient not taking: Reported on 12/24/2022)    sacubitril-valsartan (ENTRESTO) 97-103 MG Take by mouth. (Patient not taking: Reported on 02/17/2023) 02/17/2023: Out x 2 days per patient   Facility-Administered Medications Prior to Visit  Medication Dose Route Frequency Provider   mometasone-formoterol (DULERA) 200-5 MCG/ACT inhaler 2 puff  2 puff Inhalation BID Danelle [facility], PA-C    Review of Systems  Musculoskeletal:        Pain in both ankles and feet        Objective    BP (!) 150/110 (BP Location: Left Arm, Patient Position: Sitting, Cuff Size: Normal)   Pulse 80   Temp 97.6 F (36.4 C) (Oral)   Resp 18   Ht 5\' 7"  (1.702 m)   Wt  184 lb 4.8 oz (83.6 kg)   SpO2 97%   BMI 28.87 kg/m     Physical Exam Vitals reviewed.  Constitutional:      General: She is awake. She is in acute distress.     Appearance: Normal appearance. She is well-developed and well-groomed.  HENT:     Head: Normocephalic and atraumatic.  Pulmonary:     Effort: Pulmonary effort is normal.  Musculoskeletal:     Cervical back: Normal range of motion.     Comments: Mild swelling along metatarsals of left foot and lateral malleolus of right ankle No increased warmth or erythema present in areas of concern  Able to move toes of own volition and cap refill is <2 sec bilaterally with multiple toes of both feet. Dorsalis pedis is 2+ bilaterally  Ankle ROM is mildly reduced due to pain  Antalgic gait present    Neurological:     Mental Status: She is alert.  Psychiatric:        Mood and Affect: Affect is tearful.        Behavior: Behavior is cooperative.       No results found for any visits on 02/17/23.  Assessment & Plan      Return in about 4 weeks (around 03/17/2023) for Chronic follow up and med management .      Problem List Items Addressed This Visit       Musculoskeletal and Integument   Acute gout of ankle - Primary    Acute, recurrent per patient  She reports pain in both ankles and metatarsals that is not responding to home measures of Tylenol, ice and elevation Symptoms and Physical exam appear most consistent with acute gout flare Will provide short course of Colchicine 0.6 mg PO every day PRN for management along with Medrol dose pack to assist with inflammation Reviewed low purine diet with her and recommend she stays well hydrated while using Colchicine  She denies previous issues with taking Colchicine and Carvedilol in the past Reviewed most recent Nephro notes- eGFR is 42 - dosage adjustment not indicated for flare per UTD Follow up as needed for persistent or progressing symptoms        Relevant Medications    colchicine 0.6 MG tablet   methylPREDNISolone (MEDROL DOSEPAK) 4 MG TBPK tablet     Return in about 4 weeks (around 03/17/2023) for Chronic follow up and med management .   I, Javarius Tsosie E Danya Spearman, PA-C,  have reviewed all documentation for this visit. The documentation on 02/17/23 for the exam, diagnosis, procedures, and orders are all accurate and complete.   Jacquelin Hawking, MHS, PA-C [facility]

## 2023-02-17 NOTE — Telephone Encounter (Signed)
     Chief Complaint: Both feet ankles are swollen, painful to walk."I think it's my gout." Asking for medication. Declines OV. Symptoms: Above Frequency: 2 days ago Pertinent Negatives: Patient denies fever Disposition: [] ED /[] Urgent Care (no appt availability in office) / [] Appointment(In office/virtual)/ []  [facility]/ [] Home Care/ [x] Refused Recommended Disposition /[] [facility]/ []  Follow-up with PCP Additional Notes: Please advise pt.  Reason for Disposition  [1] Swollen foot AND [2] no fever  (Exceptions: localized bump from bunions, calluses, insect bite, sting)  Answer Assessment - Initial Assessment Questions 1. ONSET: "When did the pain start?"      2 days ago 2. LOCATION: "Where is the pain located?"      Feet and ankle 3. PAIN: "How bad is the pain?"    (Scale 1-10; or mild, moderate, severe)  - MILD (1-3): doesn't interfere with normal activities.   - MODERATE (4-7): interferes with normal activities (e.g., work or school) or awakens from sleep, limping.   - SEVERE (8-10): excruciating pain, unable to do any normal activities, unable to walk.      Severe with walking 4. WORK OR EXERCISE: "Has there been any recent work or exercise that involved this part of the body?"      No 5. CAUSE: "What do you think is causing the foot pain?"     Gout 6. OTHER SYMPTOMS: "Do you have any other symptoms?" (e.g., leg pain, rash, fever, numbness)     No 7. PREGNANCY: "Is there any chance you are pregnant?" "When was your last menstrual period?"     No  Protocols used: Foot Pain-[facility]

## 2023-02-18 ENCOUNTER — Other Ambulatory Visit: Payer: Self-pay | Admitting: Cardiovascular Disease

## 2023-02-18 ENCOUNTER — Ambulatory Visit: Payer: Medicaid Other | Admitting: Physician Assistant

## 2023-02-18 ENCOUNTER — Other Ambulatory Visit: Payer: Self-pay | Admitting: Family Medicine

## 2023-02-18 DIAGNOSIS — E038 Other specified hypothyroidism: Secondary | ICD-10-CM

## 2023-02-18 DIAGNOSIS — F411 Generalized anxiety disorder: Secondary | ICD-10-CM

## 2023-02-18 DIAGNOSIS — F3341 Major depressive disorder, recurrent, in partial remission: Secondary | ICD-10-CM

## 2023-02-18 MED ORDER — CARVEDILOL 12.5 MG PO TABS: 12.5000 mg | ORAL_TABLET | Freq: Two times a day (BID) | ORAL | 1 refills | Status: DC

## 2023-02-18 NOTE — Addendum Note (Signed)
Addended by: Sandi Mariscal on: 02/18/2023 02:49 PM   Modules accepted: Orders

## 2023-02-18 NOTE — Telephone Encounter (Signed)
Please advise if ok to refill Carvedilol 12.5 bid. Pt's current list reflects Carvedilol 25 mg bid. Pt has to cut tablets due to pharmacy last filled Carvedilol 25 mg bid. Per last OV 11/2022 pt was currently taking Carvedilol 12.5 mg bid. Pt would like to not have to cut pills for the current dose she is taking. Please advise if ok to send in new Rx?

## 2023-02-18 NOTE — Telephone Encounter (Signed)
Per Eula Listen PA, ok to fill Carvedilol 12.5 mg bid. Pharmacy called and had the refill number updated to 180 tablets with one refill.

## 2023-03-16 DIAGNOSIS — Z419 Encounter for procedure for purposes other than remedying health state, unspecified: Secondary | ICD-10-CM | POA: Diagnosis not present

## 2023-03-18 ENCOUNTER — Ambulatory Visit (INDEPENDENT_AMBULATORY_CARE_PROVIDER_SITE_OTHER): Payer: Medicaid Other | Admitting: Physician Assistant

## 2023-03-18 DIAGNOSIS — Z91199 Patient's noncompliance with other medical treatment and regimen due to unspecified reason: Secondary | ICD-10-CM

## 2023-03-19 NOTE — Progress Notes (Signed)
No show for apt.

## 2023-03-20 ENCOUNTER — Ambulatory Visit (INDEPENDENT_AMBULATORY_CARE_PROVIDER_SITE_OTHER): Payer: Medicaid Other | Admitting: Physician Assistant

## 2023-03-20 ENCOUNTER — Encounter: Payer: Self-pay | Admitting: Physician Assistant

## 2023-03-20 VITALS — BP 116/72 | HR 76 | Resp 16 | Ht 67.0 in | Wt 185.0 lb

## 2023-03-20 DIAGNOSIS — E038 Other specified hypothyroidism: Secondary | ICD-10-CM

## 2023-03-20 DIAGNOSIS — I2510 Atherosclerotic heart disease of native coronary artery without angina pectoris: Secondary | ICD-10-CM

## 2023-03-20 DIAGNOSIS — F411 Generalized anxiety disorder: Secondary | ICD-10-CM | POA: Diagnosis not present

## 2023-03-20 DIAGNOSIS — E782 Mixed hyperlipidemia: Secondary | ICD-10-CM | POA: Diagnosis not present

## 2023-03-20 DIAGNOSIS — I5042 Chronic combined systolic (congestive) and diastolic (congestive) heart failure: Secondary | ICD-10-CM

## 2023-03-20 DIAGNOSIS — N184 Chronic kidney disease, stage 4 (severe): Secondary | ICD-10-CM

## 2023-03-20 DIAGNOSIS — Z131 Encounter for screening for diabetes mellitus: Secondary | ICD-10-CM | POA: Diagnosis not present

## 2023-03-20 DIAGNOSIS — Z720 Tobacco use: Secondary | ICD-10-CM | POA: Diagnosis not present

## 2023-03-20 DIAGNOSIS — I10 Essential (primary) hypertension: Secondary | ICD-10-CM

## 2023-03-20 DIAGNOSIS — F339 Major depressive disorder, recurrent, unspecified: Secondary | ICD-10-CM | POA: Diagnosis not present

## 2023-03-20 DIAGNOSIS — F17210 Nicotine dependence, cigarettes, uncomplicated: Secondary | ICD-10-CM

## 2023-03-20 DIAGNOSIS — J441 Chronic obstructive pulmonary disease with (acute) exacerbation: Secondary | ICD-10-CM | POA: Diagnosis not present

## 2023-03-20 MED ORDER — CLOPIDOGREL BISULFATE 75 MG PO TABS: ORAL_TABLET | ORAL | 1 refills | Status: DC

## 2023-03-20 MED ORDER — ATORVASTATIN CALCIUM 80 MG PO TABS: 80.0000 mg | ORAL_TABLET | Freq: Every day | ORAL | 1 refills | Status: DC

## 2023-03-20 MED ORDER — PAROXETINE HCL 40 MG PO TABS
ORAL_TABLET | ORAL | 1 refills | Status: DC
Start: 2023-03-20 — End: 2023-09-13

## 2023-03-20 NOTE — Progress Notes (Signed)
Established Patient Office Visit  Name: Angelica Herrera   MRN: [id number]    DOB: [birth year]   Date:03/20/2023  Today's Provider: Jacquelin Hawking, MHS, PA-C Introduced myself to the patient as a PA-C and provided education on APPs in clinical practice.         Subjective  Chief Complaint  Chief Complaint  Patient presents with   Follow-up    Last f/u 07/30/22. Previous acute visit for gout- has gotten a lot better.    HPI   HYPERTENSION / HYPERLIPIDEMIA Satisfied with current treatment? yes Duration of hypertension: years BP monitoring frequency: a few times a week BP range: 120s/80s BP medication side effects: no BP meds: Entresto, Carvedilol, torsemide  Duration of hyperlipidemia: years Cholesterol medication side effects: no Cholesterol supplements: none Past cholesterol medications: atorvastain (lipitor) Medication compliance: good compliance Aspirin: no Recent stressors: no Recurrent headaches: Yes- reports this is more common when she doesn't wear glasses Visual changes: no Palpitations: no Dyspnea: no Chest pain: no Lower extremity edema: no Dizzy/lightheaded: no   CKD  She is seen by nephrology - most recently seen on 01/16/2023 = "Acute Kidney Injury with hyperkalemia on chronic kidney disease stage IIIB: bland urine. Baseline creatinine of 1.5, GFR of 42. Suspect acute kidney injury secondary to ATN. Chronic Kidney disease secondary to hypertension. - Continue Entresto - Not currently on a SGLT-2 inhibitor - Not currently on a mineralocorticoid receptor antagonist - Avoid nonsteroidal anti-inflammatory agents - continue to hold potassium chloride.   Hypertension with chronic kidney disease: with chronic systolic congestive heart failure.  - Continue current regimen of carvedilol, Entresto, and torsemide.   Anemia with chronic kidney disease: negative SPEP/UPEP. No iron studies. Patient to avoid alcohol. "     Mood She reports this has been  good She states "I feel better right now than I have in long while"  She reports depression symptoms have improved- she just had a grandson  HYPOTHYROIDISM Thyroid control status:stable Satisfied with current treatment? yes Medication side effects: no Medication compliance: good compliance Etiology of hypothyroidism:  Recent dose adjustment:no Fatigue: no Cold intolerance: yes Heat intolerance: no Weight gain: no Weight loss: no Constipation: no Diarrhea/loose stools: no Palpitations: no Lower extremity edema: no Anxiety/depressed mood: no    She reports she has not had to take Pepcid for several months Reports since her colonoscopy she has not had much of GERD symptoms      03/20/2023   10:21 AM 02/17/2023   11:08 AM 07/30/2022    9:03 AM 05/31/2022   11:07 AM 05/28/2022   11:06 AM  Depression screen PHQ 2/9  Decreased Interest 0 0 0 0 0  Down, Depressed, Hopeless 0 0 0 0 0  PHQ - 2 Score 0 0 0 0 0  Altered sleeping  0 0 0 0  Tired, decreased energy  0 0 0 0  Change in appetite  0 0 0 0  Feeling bad or failure about yourself   0 0 0 0  Trouble concentrating  0 0 0 0  Moving slowly or fidgety/restless  0 0 0 0  Suicidal thoughts  0 0 0 0  PHQ-9 Score  0 0 0 0  Difficult doing work/chores  Not difficult at all Not difficult at all Not difficult at all Not difficult at all       07/30/2022    9:12 AM 01/10/2022    2:05 PM 12/27/2021    2:09  PM 02/28/2021    1:12 PM  GAD 7 : Generalized Anxiety Score  Nervous, Anxious, on Edge 3 2 3 3   Control/stop worrying 3 3 3 3   Worry too much - different things 3 3 3 3   Trouble relaxing 3 2 3  0  Restless 3 2 3  0  Easily annoyed or irritable 3 3 3 3   Afraid - awful might happen 1 2 3 3   Total GAD 7 Score 19 17 21 15   Anxiety Difficulty Extremely difficult Somewhat difficult Very difficult        Patient Active Problem List   Diagnosis Date Noted   Acute gout of ankle 02/17/2023   Anemia in chronic kidney disease  01/16/2023   Benign hypertensive kidney disease with chronic kidney disease 01/16/2023   Hyperkalemia 01/16/2023   Acute kidney failure (HCC) 01/16/2023   Chronic kidney disease, stage 3b (HCC) 01/16/2023   Demand ischemia (HCC) 12/25/2022   Elevated troponin 12/25/2022   NSTEMI (non-ST elevated myocardial infarction) (HCC) 12/24/2022   Acute renal failure with acute tubular necrosis superimposed on stage 3b chronic kidney disease (HCC) 12/24/2022   Family history of colon cancer 10/30/2022   Adenomatous polyp of colon 10/30/2022   Generalized abdominal pain 10/30/2022   Elevated hemoglobin (HCC) 05/28/2022   History of pancreatitis 05/28/2022   Lack of access to transportation 12/27/2021   Arthritis 09/04/2021   Chronic combined systolic (congestive) and diastolic (congestive) heart failure (HCC) 09/04/2021   Alcoholic pancreatitis 09/04/2021   Stage 3b chronic kidney disease (HCC) 02/18/2020   Anxiety with depression 02/18/2020   Tobacco dependence 02/18/2020   Rhinosinusitis 09/02/2019   COPD with acute exacerbation (HCC) 09/02/2019   Squamous cell carcinoma of back 04/20/2019   Accelerated hypertension 11/13/2018   Chest pain 11/12/2018   S/P primary angioplasty with coronary stent 03/04/2018   Depression, recurrent (HCC) 12/02/2017   Chronic systolic heart failure (HCC) 11/21/2017   Controlled substance agreement signed 09/07/2015   Coronary artery disease involving native coronary artery    Allergic rhinitis due to pollen 06/28/2015   GAD (generalized anxiety disorder) 03/17/2015   Degenerative disc disease, cervical 03/17/2015   Hyperlipidemia    Obesity    CKD (chronic kidney disease), stage IV (HCC)    Ischemic cardiomyopathy    Hypothyroidism    History of acute anterior wall MI 02/12/2012   Essential hypertension, malignant 02/12/2012   Tobacco abuse 02/12/2012    Past Surgical History:  Procedure Laterality Date   ABDOMINAL HYSTERECTOMY     BIOPSY  10/30/2022    Procedure: BIOPSY;  Surgeon: Wyline Mood, MD;  Location: [facility] ENDOSCOPY;  Service: Gastroenterology;;   CARDIAC CATHETERIZATION  2013   [facility] s/p stent   CARDIAC CATHETERIZATION Left 08/03/2015   Procedure: Left Heart Cath and Coronary Angiography;  Surgeon: Iran Ouch, MD;  Location: [facility] INVASIVE CV LAB;  Service: Cardiovascular;  Laterality: Left;   COLONOSCOPY WITH PROPOFOL N/A 10/30/2022   Procedure: COLONOSCOPY WITH PROPOFOL;  Surgeon: Wyline Mood, MD;  Location: [facility] ENDOSCOPY;  Service: Gastroenterology;  Laterality: N/A;   DILATION AND CURETTAGE OF UTERUS     ESOPHAGOGASTRODUODENOSCOPY (EGD) WITH PROPOFOL N/A 10/30/2022   Procedure: ESOPHAGOGASTRODUODENOSCOPY (EGD) WITH PROPOFOL;  Surgeon: Wyline Mood, MD;  Location: [facility] ENDOSCOPY;  Service: Gastroenterology;  Laterality: N/A;   LEFT HEART CATHETERIZATION WITH CORONARY ANGIOGRAM N/A 02/11/2012   Procedure: LEFT HEART CATHETERIZATION WITH CORONARY ANGIOGRAM;  Surgeon: Tonny Bollman, MD;  Location: [facility] CATH LAB;  Service: Cardiovascular;  Laterality: N/A;  POLYPECTOMY  10/30/2022   Procedure: POLYPECTOMY;  Surgeon: Wyline Mood, MD;  Location: [facility] ENDOSCOPY;  Service: Gastroenterology;;   RENAL INTERVENTION N/A 11/17/2018   Procedure: RENAL INTERVENTION;  Surgeon: Renford Dills, MD;  Location: [facility] INVASIVE CV LAB;  Service: Cardiovascular;  Laterality: N/A;    Family History  Problem Relation Age of Onset   Hypertension Father    Thyroid disease Father    AAA (abdominal aortic aneurysm) Father    Hypertension Mother    Hyperlipidemia Mother    Diabetes Mother    Kidney disease Mother    Hypertension Sister    Hypertension Sister    Supraventricular tachycardia Sister    Hypertension Brother    Hypertension Brother    Hypertension Daughter    Cancer Paternal Grandfather        lung    Social History   Tobacco Use   Smoking status: Every Day    Current packs/day: 0.25    Average packs/day: 0.3 packs/day for  30.0 years (7.5 ttl pk-yrs)    Types: Cigarettes   Smokeless tobacco: Never  Substance Use Topics   Alcohol use: Not Currently    Comment: Says she previously drank socially - never a heavy drinker.  No etoh since 08/2018.     Current Outpatient Medications:    acetaminophen (TYLENOL) 500 MG tablet, Take 1,000 mg by mouth every 6 (six) hours as needed., Disp: , Rfl:    aspirin 81 MG tablet, Take 1 tablet (81 mg total) by mouth daily., Disp: 30 tablet, Rfl:    busPIRone (BUSPAR) 5 MG tablet, TAKE 1 TO 3 TABLETS BY MOUTH TWICE DAILY AS NEEDED, Disp: 120 tablet, Rfl: 0   carvedilol (COREG) 12.5 MG tablet, Take 1 tablet (12.5 mg total) by mouth 2 (two) times daily with a meal., Disp: 180 tablet, Rfl: 1   cetirizine (ZYRTEC) 10 MG tablet, TAKE (1) TABLET BY MOUTH  AT BEDTIME, Disp: 30 tablet, Rfl: 6   colchicine 0.6 MG tablet, Take 1 tablet (0.6 mg total) by mouth daily as needed (gout)., Disp: 15 tablet, Rfl: 0   gabapentin (NEURONTIN) 300 MG capsule, Take 1 capsule (300 mg total) by mouth 2 (two) times daily., Disp: 180 capsule, Rfl: 1   levothyroxine (SYNTHROID) 125 MCG tablet, TAKE (1) TABLET BY MOUTH EVERY MORNING BEFORE BREAKFAST, Disp: 90 tablet, Rfl: 0   nitroGLYCERIN (NITROSTAT) 0.4 MG SL tablet, Place 1 tablet (0.4 mg total) under the tongue every 5 (five) minutes x 3 doses as needed for chest pain., Disp: 25 tablet, Rfl: 3   torsemide (DEMADEX) 20 MG tablet, Take by mouth., Disp: , Rfl:    atorvastatin (LIPITOR) 80 MG tablet, Take 1 tablet (80 mg total) by mouth at bedtime., Disp: 90 tablet, Rfl: 1   clopidogrel (PLAVIX) 75 MG tablet, TAKE (1) TABLET BY MOUTH EVERY DAY, Disp: 90 tablet, Rfl: 1   colchicine 0.6 MG tablet, Take 1 tablet (0.6 mg total) by mouth daily for 5 days., Disp: 5 tablet, Rfl: 0   PARoxetine (PAXIL) 40 MG tablet, TAKE (1) TABLET BY MOUTH EVERY DAY, Disp: 90 tablet, Rfl: 1   sacubitril-valsartan (ENTRESTO) 97-103 MG, Take by mouth. (Patient not taking: Reported on  02/17/2023), Disp: , Rfl:   Current Facility-Administered Medications:    mometasone-formoterol (DULERA) 200-5 MCG/ACT inhaler 2 puff, 2 puff, Inhalation, BID, Angelica Chessman, Leisa, PA-C  No Known Allergies  I personally reviewed active problem list, medication list, allergies, health maintenance, notes from last encounter, lab results with  the patient/caregiver today.   ROS  See HPI for relevant ROS   Objective  Vitals:   03/20/23 1021  BP: 116/72  Pulse: 76  Resp: 16  SpO2: 92%  Weight: 185 lb (83.9 kg)  Height: 5\' 7"  (1.702 m)    Body mass index is 28.98 kg/m.  Physical Exam Vitals reviewed.  Constitutional:      General: She is awake.     Appearance: Normal appearance. She is well-developed and well-groomed.  HENT:     Head: Normocephalic and atraumatic.  Cardiovascular:     Rate and Rhythm: Normal rate and regular rhythm.     Pulses: Normal pulses.          Radial pulses are 2+ on the right side and 2+ on the left side.     Heart sounds: Normal heart sounds. No murmur heard.    No friction rub. No gallop.  Pulmonary:     Effort: Pulmonary effort is normal.     Breath sounds: Normal breath sounds. No decreased air movement. No decreased breath sounds, wheezing, rhonchi or rales.  Musculoskeletal:     Cervical back: Normal range of motion.     Right lower leg: No edema.     Left lower leg: No edema.  Neurological:     General: No focal deficit present.     Mental Status: She is alert and oriented to person, place, and time. Mental status is at baseline.     GCS: GCS eye subscore is 4. GCS verbal subscore is 5. GCS motor subscore is 6.  Psychiatric:        Attention and Perception: Attention and perception normal.        Mood and Affect: Mood and affect normal.        Speech: Speech normal.        Behavior: Behavior normal. Behavior is cooperative.        Thought Content: Thought content normal.        Cognition and Memory: Cognition normal.          PHQ2/9:    03/20/2023   10:21 AM 02/17/2023   11:08 AM 07/30/2022    9:03 AM 05/31/2022   11:07 AM 05/28/2022   11:06 AM  Depression screen PHQ 2/9  Decreased Interest 0 0 0 0 0  Down, Depressed, Hopeless 0 0 0 0 0  PHQ - 2 Score 0 0 0 0 0  Altered sleeping  0 0 0 0  Tired, decreased energy  0 0 0 0  Change in appetite  0 0 0 0  Feeling bad or failure about yourself   0 0 0 0  Trouble concentrating  0 0 0 0  Moving slowly or fidgety/restless  0 0 0 0  Suicidal thoughts  0 0 0 0  PHQ-9 Score  0 0 0 0  Difficult doing work/chores  Not difficult at all Not difficult at all Not difficult at all Not difficult at all      Fall Risk:    03/20/2023   10:21 AM 02/17/2023   11:08 AM 07/30/2022    9:03 AM 05/31/2022   11:07 AM 05/28/2022   11:06 AM  Fall Risk   Falls in the past year? 0 0 0 0 0  Number falls in past yr: 0 0 0 0 0  Injury with Fall? 0 0 0 0 0  Risk for fall due to : No Fall Risks No Fall Risks No Fall Risks No  Fall Risks No Fall Risks  Follow up Falls prevention discussed Falls prevention discussed;Education provided;Falls evaluation completed Falls prevention discussed;Education provided;Falls evaluation completed Falls prevention discussed;Education provided;Falls evaluation completed Falls prevention discussed;Education provided;Falls evaluation completed      Functional Status Survey: Is the patient deaf or have difficulty hearing?: No Does the patient have difficulty seeing, even when wearing glasses/contacts?: No Does the patient have difficulty concentrating, remembering, or making decisions?: No Does the patient have difficulty walking or climbing stairs?: Yes Does the patient have difficulty dressing or bathing?: No Does the patient have difficulty doing errands alone such as visiting a doctor's office or shopping?: No    Assessment & Plan  Problem List Items Addressed This Visit       Cardiovascular and Mediastinum   Essential  hypertension, malignant    Chronic, historic condition Appears well-controlled on current regimen comprised of carvedilol 12.5 mg p.o. twice daily, Entresto 97-103 mg p.o. daily, torsemide 20 mg p.o. daily She is followed regularly by cardiology but missed her most recent appointment.  She has plans to reschedule BP appears to be in goal today and she reports home measures are 120s/80s Continue current regimen Continue regular follow-up with cardiology Follow-up in 3 months or sooner if concerns arise      Relevant Medications   atorvastatin (LIPITOR) 80 MG tablet   Other Relevant Orders   COMPLETE METABOLIC PANEL WITH GFR (Completed)   CBC w/Diff/Platelet (Completed)   Coronary artery disease involving native coronary artery    Chronic, ongoing She is currently managed on Atorvastatin and is followed by Cardiology        Relevant Medications   atorvastatin (LIPITOR) 80 MG tablet   Chronic combined systolic (congestive) and diastolic (congestive) heart failure (HCC) - Primary    Chronic, ongoing Reviewed Cardiology notes from 11/15/2022 - appears to have preserved EF of 35-40% per most recent imaging  Continue current regimen comprised of Entresto, Torsemide, Carvedilol per Cardiology recommendations Continue regular follow up with specialty  Follow up in 6 months or sooner if concerns arise        Relevant Medications   atorvastatin (LIPITOR) 80 MG tablet     Respiratory   COPD with acute exacerbation (HCC)    Chronic, historic condition  Appears well managed today - appears to be taking Dulera and she denies SOB at this time Continue current regimen Follow up in 6 months or sooner if concerns arise          Endocrine   Hypothyroidism    Chronic, historic condition Appears stable at this time on current management comprised of levothyroxine 125 mcg p.o. daily Recheck TSH, T4 today Results to dictate further management Follow-up in 6 months or sooner if concerns  arise      Relevant Orders   TSH (Completed)   T4 (Completed)     Genitourinary   CKD (chronic kidney disease), stage IV (HCC)    Chronic, historic condition Most recent EGFR was 42 in October 2024 per nephrology note Recheck CMP today.  Results to dictate further management Nephrology recommends that she continues Entresto, avoids NSAIDs Will attempt to provide continued BP management with cardiology to prevent further kidney damage Follow-up in 3 months or sooner if concerns arise        Other   Tobacco abuse    Chronic, ongoing She is still smoking - almost half pack per day  She would like to quit- reports she has nicotine patches at the house but  has issues with quitting  Reviewed that these should only be used once she stops smoking  Discussed cessation techniques, quitline, Chantix and replacement for approx 3 minutes of apt       Hyperlipidemia    Chronic, historic condition She is currently taking atorvastatin 80 mg p.o. nightly, Entresto 97-103 mg p.o. daily and appears to be tolerating well Recheck lipid panel Results to dictate further management Follow-up in 6 months or sooner if concerns arise      Relevant Medications   atorvastatin (LIPITOR) 80 MG tablet   Other Relevant Orders   Lipid Profile (Completed)   GAD (generalized anxiety disorder)    Chronic, historic condition She reports that her mood has been good and she feels better than she has in a significant amount of time She is currently taking Paxil 40 mg p.o. daily along with BuSpar 5 mg p.o. 3 times daily as needed She reports current regimen is satisfactory.  Continue current regimen Follow-up in 6 months or sooner if concerns arise      Relevant Medications   PARoxetine (PAXIL) 40 MG tablet   Depression, recurrent (HCC)    Chronic, ongoing Appears improved at this time Reviewed PHQ9 scores today which is 0 She reports improvement in mood and is still taking paroxetine 40 mg PO every  day Continue with this regimen  Follow up in 6 months or sooner if concerns arise        Relevant Medications   PARoxetine (PAXIL) 40 MG tablet   Other Visit Diagnoses     Screening for diabetes mellitus (DM)       Relevant Orders   HgB A1c (Completed)        Return in about 6 months (around 09/18/2023) for HLD, HTN, Depression, anxiety, CAD,CKD.   I, Annemarie Sebree E Nosson Wender, PA-C, have reviewed all documentation for this visit. The documentation on 03/24/23 for the exam, diagnosis, procedures, and orders are all accurate and complete.   Jacquelin Hawking, MHS, PA-C [facility]

## 2023-03-20 NOTE — Assessment & Plan Note (Signed)
Chronic, historic condition Most recent EGFR was 42 in October 2024 per nephrology note Recheck CMP today.  Results to dictate further management Nephrology recommends that she continues Entresto, avoids NSAIDs Will attempt to provide continued BP management with cardiology to prevent further kidney damage Follow-up in 3 months or sooner if concerns arise

## 2023-03-20 NOTE — Assessment & Plan Note (Signed)
Chronic, historic condition Appears stable at this time on current management comprised of levothyroxine 125 mcg p.o. daily Recheck TSH, T4 today Results to dictate further management Follow-up in 6 months or sooner if concerns arise

## 2023-03-20 NOTE — Assessment & Plan Note (Signed)
Chronic, historic condition She is currently taking atorvastatin 80 mg p.o. nightly, Entresto 97-103 mg p.o. daily and appears to be tolerating well Recheck lipid panel Results to dictate further management Follow-up in 6 months or sooner if concerns arise

## 2023-03-20 NOTE — Assessment & Plan Note (Signed)
>>  ASSESSMENT AND PLAN FOR CKD (CHRONIC KIDNEY DISEASE), STAGE IV (HCC) WRITTEN ON 03/20/2023  1:18 PM BY MECUM, ERIN E, PA-C  Chronic, historic condition Most recent EGFR was 42 in October 2024 per nephrology note Recheck CMP today.  Results to dictate further management Nephrology recommends that she continues Entresto , avoids NSAIDs Will attempt to provide continued BP management with cardiology to prevent further kidney damage Follow-up in 3 months or sooner if concerns arise

## 2023-03-20 NOTE — Assessment & Plan Note (Signed)
Chronic, historic condition She reports that her mood has been good and she feels better than she has in a significant amount of time She is currently taking Paxil 40 mg p.o. daily along with BuSpar 5 mg p.o. 3 times daily as needed She reports current regimen is satisfactory.  Continue current regimen Follow-up in 6 months or sooner if concerns arise

## 2023-03-20 NOTE — Assessment & Plan Note (Addendum)
Chronic, ongoing She is still smoking - almost half pack per day  She would like to quit- reports she has nicotine patches at the house but has issues with quitting  Reviewed that these should only be used once she stops smoking  Discussed cessation techniques, quitline, Chantix and replacement for approx 3 minutes of apt

## 2023-03-20 NOTE — Assessment & Plan Note (Signed)
Chronic, historic condition Appears well-controlled on current regimen comprised of carvedilol 12.5 mg p.o. twice daily, Entresto 97-103 mg p.o. daily, torsemide 20 mg p.o. daily She is followed regularly by cardiology but missed her most recent appointment.  She has plans to reschedule BP appears to be in goal today and she reports home measures are 120s/80s Continue current regimen Continue regular follow-up with cardiology Follow-up in 3 months or sooner if concerns arise

## 2023-03-21 LAB — CBC WITH DIFFERENTIAL/PLATELET
Absolute Lymphocytes: 1294 {cells}/uL (ref 850–3900)
Absolute Monocytes: 403 {cells}/uL (ref 200–950)
Basophils Absolute: 42 {cells}/uL (ref 0–200)
Basophils Relative: 0.5 %
Eosinophils Absolute: 160 {cells}/uL (ref 15–500)
Eosinophils Relative: 1.9 %
HCT: 42.8 % (ref 35.0–45.0)
Hemoglobin: 14.2 g/dL (ref 11.7–15.5)
MCH: 29.2 pg (ref 27.0–33.0)
MCHC: 33.2 g/dL (ref 32.0–36.0)
MCV: 87.9 fL (ref 80.0–100.0)
MPV: 10.7 fL (ref 7.5–12.5)
Monocytes Relative: 4.8 %
Neutro Abs: 6502 {cells}/uL (ref 1500–7800)
Neutrophils Relative %: 77.4 %
Platelets: 250 10*3/uL (ref 140–400)
RBC: 4.87 10*6/uL (ref 3.80–5.10)
RDW: 13.7 % (ref 11.0–15.0)
Total Lymphocyte: 15.4 %
WBC: 8.4 10*3/uL (ref 3.8–10.8)

## 2023-03-21 LAB — LIPID PANEL
Cholesterol: 136 mg/dL (ref <200)
HDL: 48 mg/dL — ABNORMAL LOW (ref >=50)
LDL Cholesterol (Calc): 65 mg/dL
Non-HDL Cholesterol (Calc): 88 mg/dL (ref <130)
Total CHOL/HDL Ratio: 2.8 (calc) (ref <5.0)
Triglycerides: 155 mg/dL — ABNORMAL HIGH (ref <150)

## 2023-03-21 LAB — COMPLETE METABOLIC PANEL WITH GFR
AG Ratio: 1.6 (calc) (ref 1.0–2.5)
ALT: 17 U/L (ref 6–29)
AST: 17 U/L (ref 10–35)
Albumin: 4.1 g/dL (ref 3.6–5.1)
Alkaline phosphatase (APISO): 111 U/L (ref 37–153)
BUN/Creatinine Ratio: 16 (calc) (ref 6–22)
BUN: 20 mg/dL (ref 7–25)
CO2: 30 mmol/L (ref 20–32)
Calcium: 9.5 mg/dL (ref 8.6–10.4)
Chloride: 105 mmol/L (ref 98–110)
Creat: 1.26 mg/dL — ABNORMAL HIGH (ref 0.50–1.03)
Globulin: 2.6 g/dL (ref 1.9–3.7)
Glucose, Bld: 116 mg/dL — ABNORMAL HIGH (ref 65–99)
Potassium: 3.1 mmol/L — ABNORMAL LOW (ref 3.5–5.3)
Sodium: 146 mmol/L (ref 135–146)
Total Bilirubin: 0.4 mg/dL (ref 0.2–1.2)
Total Protein: 6.7 g/dL (ref 6.1–8.1)
eGFR: 51 mL/min/{1.73_m2} — ABNORMAL LOW (ref >=60)

## 2023-03-21 LAB — TSH: TSH: 1.08 m[IU]/L

## 2023-03-21 LAB — HEMOGLOBIN A1C
Hgb A1c MFr Bld: 6.1 %{Hb} — ABNORMAL HIGH (ref <5.7)
Mean Plasma Glucose: 128 mg/dL
eAG (mmol/L): 7.1 mmol/L

## 2023-03-21 LAB — T4: T4, Total: 5.8 ug/dL (ref 5.1–11.9)

## 2023-03-24 NOTE — Assessment & Plan Note (Signed)
Chronic, ongoing She is currently managed on Atorvastatin and is followed by Cardiology

## 2023-03-24 NOTE — Progress Notes (Signed)
Your labs are back Overall your cholesterol looks pretty good.  Please continue to take your cholesterol medication as directed Your liver function testing appears to be in normal range.  Your kidney function is decreased but appears improved from previous lab testing.  Your potassium is a little low.  I recommend increasing your intake of potassium rich foods such as bananas, potatoes, tomatoes as desired to help manage this. Your A1c was 6.1% which is in the prediabetic range. No medications are indicated at this time but I do recommend reducing your sugar and carb intake and making sure you are exercising regularly Thyroid testing was normal Your CBC is overall normal, no signs of anemia Please continue to take your medications as directed.  Please let us know if you have further questions or concerns

## 2023-03-24 NOTE — Assessment & Plan Note (Signed)
Chronic, ongoing Appears improved at this time Reviewed PHQ9 scores today which is 0 She reports improvement in mood and is still taking paroxetine 40 mg PO every day Continue with this regimen  Follow up in 6 months or sooner if concerns arise

## 2023-03-24 NOTE — Assessment & Plan Note (Signed)
Chronic, historic condition  Appears well managed today - appears to be taking Dulera and she denies SOB at this time Continue current regimen Follow up in 6 months or sooner if concerns arise

## 2023-03-24 NOTE — Assessment & Plan Note (Signed)
Chronic, ongoing Reviewed Cardiology notes from 11/15/2022 - appears to have preserved EF of 35-40% per most recent imaging  Continue current regimen comprised of Entresto, Torsemide, Carvedilol per Cardiology recommendations Continue regular follow up with specialty  Follow up in 6 months or sooner if concerns arise

## 2023-04-01 ENCOUNTER — Ambulatory Visit: Payer: Self-pay

## 2023-04-01 ENCOUNTER — Telehealth (INDEPENDENT_AMBULATORY_CARE_PROVIDER_SITE_OTHER): Payer: Medicaid Other | Admitting: Physician Assistant

## 2023-04-01 DIAGNOSIS — M109 Gout, unspecified: Secondary | ICD-10-CM

## 2023-04-01 MED ORDER — COLCHICINE 0.6 MG PO TABS: 0.6000 mg | ORAL_TABLET | Freq: Every day | ORAL | 0 refills | Status: DC

## 2023-04-01 NOTE — Patient Instructions (Signed)
VISIT SUMMARY:  Today, you were seen for swelling and warmth in your right big toe, which has not improved with cold compresses. You have a history of gout and have not been following the recommended diet and hydration guidelines, which may have contributed to this flare-up.   YOUR PLAN:  -GOUT FLARE: A gout flare is a sudden and severe onset of pain, swelling, and redness in a joint, often caused by the accumulation of uric acid crystals. You have been prescribed colchicine to help resolve the symptoms. Please increase your water intake to support your kidney function and reduce side effects. Monitor your symptoms closely and return if there is no improvement or if symptoms worsen, as this could indicate an infection called cellulitis.  -GENERAL HEALTH MAINTENANCE: Your current diet and hydration habits are contributing to your gout flares. It is important to drink more water and reduce your sugar intake to help prevent future flare-ups and support overall health.  INSTRUCTIONS:  Please take the prescribed colchicine as directed. Increase your water intake and reduce your sugar consumption. Monitor your symptoms and return to the clinic if there is no improvement or if symptoms worsen. Please stop the colchicine as soon as your gout symptoms resolve to prevent complications or interactions with your other medications.

## 2023-04-01 NOTE — Progress Notes (Signed)
Virtual Visit via Video Note  I connected with Angelica Herrera on 04/01/23 at 10:00 AM EST by a video enabled telemedicine application and verified that I am speaking with the correct person using two identifiers.   Today's Provider: Jacquelin Hawking, MHS, PA-C Introduced myself to the patient as a PA-C and provided education on APPs in clinical practice.   Location: Patient: At home  Provider: [facility], [place], [place]    I discussed the limitations of evaluation and management by telemedicine and the availability of in person appointments. The patient expressed understanding and agreed to proceed.   Chief Complaint  Patient presents with   Toe Pain    R big toe, x3 days. Red and swelling. "Sharp" pain    History of Present Illness:   Discussed the use of AI scribe software for clinical note transcription with the patient, who gave verbal consent to proceed.  History of Present Illness   The patient presents with a chief complaint of swelling and warmth in the right foot, specifically localized to the big toe. The patient reports that the foot has been increasingly warm to the touch and the swelling has not subsided despite attempts to alleviate the symptoms with cold compresses. The patient notes that while the cold treatment temporarily reduced the warmth, it did not affect the swelling and the warmth returned shortly after.  The patient has a history of gout and has previously been prescribed colchicine, which typically resolves the symptoms within three days. However, the patient has not been adhering to a recommended diet and hydration regimen, consuming excessive sugars and insufficient water, which may have contributed to the current flare-up.  The patient's kidney function has reportedly improved recently, but the patient has not been taking any medication for gout at the time of the consultation, other than Tylenol.           Observations/Objective:  Due to the nature of the virtual visit, physical exam and observations are limited. Able to obtain the following observations:   Alert, oriented, x3 - patient appears tearful  Appears uncomfortable, in acute distress.  No scleral injection, no appreciated hoarseness, tachypnea, wheeze or strider. Able to maintain conversation without visible strain.  No cough appreciated during visit.  Able to visualize right great toe which appears mildly swollen and light pink in color. Right foot appears mildly swollen. No bright erythema, observable streaking or purulent drainage noted but visualization is limited due to virtual visit  Assessment and Plan:  Problem List Items Addressed This Visit   None Visit Diagnoses       Acute gout involving toe of right foot, unspecified cause    -  Primary   Relevant Medications   colchicine 0.6 MG tablet      Assessment and Plan    Gout Flare Acute gout flare in the right first metatarsophalangeal joint with swelling, warmth, and erythema. Concern for potential cellulitis due to persistent symptoms. Colchicine typically resolves symptoms within 1-2 days. - Prescribe colchicine 0.6 mg PO QD - will need to stop once symptoms resolve due to interactions with Carvedilol - Advise increased hydration to support renal function and reduce side effects - Instruct to monitor symptoms and return if no improvement or worsening to rule out cellulitis  General Health Maintenance Inadequate hydration and high sugar intake contributing to gout flares. Kidney function has shown some improvement. - Advise increased water intake - Recommend reducing sugar intake.  Follow Up Instructions:    I discussed the assessment and treatment plan with the patient. The patient was provided an opportunity to ask questions and all were answered. The patient agreed with the plan and demonstrated an understanding of the instructions.    The patient was advised to call back or seek an in-person evaluation if the symptoms worsen or if the condition fails to improve as anticipated.  I provided 7 minutes of non-face-to-face time during this encounter.  No follow-ups on file.   I, Clarabelle Oscarson E [place], PA-C, have reviewed all documentation for this visit. The documentation on 04/01/23 for the exam, diagnosis, procedures, and orders are all accurate and complete.   Jacquelin Hawking, MHS, PA-C [facility]

## 2023-04-01 NOTE — Telephone Encounter (Signed)
  Chief Complaint: severe right big toe pain  Symptoms: redness and swelling and warmth Frequency: 3 days  Pertinent Negatives: Patient denies leg pain, fever, rash Disposition: [] ED /[] Urgent Care (no appt availability in office) / [] Appointment(In office/virtual)/ [x]  [facility]/ [] Home Care/ [] Refused Recommended Disposition /[] [facility]/ []  Follow-up with PCP Additional Notes: pt extremely upset and crying in pain. Begged for pain med. Advised pt that she will need to see her provider. Pt. stated she cannot come in and "why can't she just call something in!!" Pt agreed to a virtual visit. Appt made this am with Denny Peon Mecum PA. Reason for Disposition  [1] SEVERE pain (e.g., excruciating, unable to do any normal activities) AND [2] not improved after 2 hours of pain medicine  Answer Assessment - Initial Assessment Questions 1. ONSET: "When did the pain start?"      3 rd day  2. LOCATION: "Where is the pain located?"      Foot pain right big toe  3. PAIN: "How bad is the pain?"    (Scale 1-10; or mild, moderate, severe)  - MILD (1-3): doesn't interfere with normal activities.   - MODERATE (4-7): interferes with normal activities (e.g., work or school) or awakens from sleep, limping.   - SEVERE (8-10): excruciating pain, unable to do any normal activities, unable to walk.      Severe- sharp  5. CAUSE: "What do you think is causing the foot pain?"     Gout flare 6. OTHER SYMPTOMS: "Do you have any other symptoms?" (e.g., leg pain, rash, fever, numbness)     No- fever red foot  7. PREGNANCY: "Is there any chance you are pregnant?" "When was your last menstrual period?"     N/a  Protocols used: Foot Pain-A-AH

## 2023-04-03 ENCOUNTER — Other Ambulatory Visit: Payer: Self-pay | Admitting: Family Medicine

## 2023-04-03 DIAGNOSIS — G8929 Other chronic pain: Secondary | ICD-10-CM

## 2023-04-16 DIAGNOSIS — Z419 Encounter for procedure for purposes other than remedying health state, unspecified: Secondary | ICD-10-CM | POA: Diagnosis not present

## 2023-05-17 DIAGNOSIS — Z419 Encounter for procedure for purposes other than remedying health state, unspecified: Secondary | ICD-10-CM | POA: Diagnosis not present

## 2023-06-14 DIAGNOSIS — Z419 Encounter for procedure for purposes other than remedying health state, unspecified: Secondary | ICD-10-CM | POA: Diagnosis not present

## 2023-06-18 ENCOUNTER — Other Ambulatory Visit: Payer: Self-pay | Admitting: Physician Assistant

## 2023-06-18 ENCOUNTER — Other Ambulatory Visit: Payer: Self-pay | Admitting: Family Medicine

## 2023-06-18 DIAGNOSIS — E038 Other specified hypothyroidism: Secondary | ICD-10-CM

## 2023-06-18 DIAGNOSIS — G8929 Other chronic pain: Secondary | ICD-10-CM

## 2023-06-19 NOTE — Telephone Encounter (Signed)
 Requested Prescriptions  Pending Prescriptions Disp Refills   gabapentin (NEURONTIN) 300 MG capsule [Pharmacy Med Name: GABAPENTIN 300MG  CAPSULE] 180 capsule 0    Sig: TAKE (1) CAPSULE BY MOUTH TWICE DAILY     Neurology: Anticonvulsants - gabapentin Failed - 06/19/2023 10:57 AM      Failed - Cr in normal range and within 360 days    Creat  Date Value Ref Range Status  03/20/2023 1.26 (H) 0.50 - 1.03 mg/dL Final         Passed - Completed PHQ-2 or PHQ-9 in the last 360 days      Passed - Valid encounter within last 12 months    Recent Outpatient Visits           2 months ago Acute gout involving toe of right foot, unspecified cause   [facility] Mecum, Erin E, PA-C   3 months ago Chronic combined systolic (congestive) and diastolic (congestive) heart failure (HCC)   [facility] Mecum, Oswaldo Conroy, PA-C   3 months ago No-show for appointment   [facility] Mecum, Erin E, PA-C   4 months ago Acute gout of ankle, unspecified cause, unspecified laterality   [facility] Mecum, Erin E, PA-C   10 months ago Hyperlipidemia LDL goal <70   [facility] Danelle Berry, [place]       Future Appointments             In 2 weeks Dunn, Raymon Mutton, PA-C [facility] [place]   In 3 months Danelle Berry, PA-C [facility], [facility]

## 2023-07-03 ENCOUNTER — Ambulatory Visit: Payer: Self-pay | Admitting: Family Medicine

## 2023-07-03 NOTE — Telephone Encounter (Signed)
 Pt declined appt for today with Dr Caralee Ates due to transportation. Pt is scheduled for 07/04/23

## 2023-07-03 NOTE — Telephone Encounter (Signed)
 Do we have any appts?

## 2023-07-03 NOTE — Progress Notes (Unsigned)
 Cardiology Office Note    Date:  07/04/2023   ID:  Angelica Herrera, DOB 05/15/1970, MRN [id number]  PCP:  Danelle Berry, PA-C  Cardiologist:  Lorine Bears, MD  Electrophysiologist:  None   Chief Complaint: Follow up  History of Present Illness:   Angelica Herrera is a 53 y.o. female with history of CAD with anterolateral ST elevation MI in 01/2012 with PTCA/DES to the left main at that time as detailed below complicated by hemodynamic instability and VT, HFrEF secondary to ICM, mitral and tricuspid regurgitation, left renal artery stenosis status post left renal artery stenting in 11/2018 by vascular surgery, CKD stage III, HLD, hypothyroidism, depression, and tobacco use who presents for follow-up of her CAD and cardiomyopathy.   She was admitted to the hospital in 01/2012 with an anterolateral STEMI. She underwent emergent cardiac catheterization which showed occlusion of the left main coronary artery. There was significant difficulty engaging the left main, though she ultimately underwent successful PCI/DES. She also had PTCA done to the LAD due to embolization from the left main. Post procedure she had significant hemodynamic instablity with VT and hypotension. She was noted to have severe MR initially, however subsequent echocardiogram showed an EF of 45% with no significant MR. She was lost to follow up from 2013-2017. Upon re-establishing with our group in 2017, she continued to smoke and had been off her medications. In 2017, she had atypical chest pain with a Lexiscan MPI showing prior anterior infarct with mild peri-infarct ischemia and an EF of 33%. She underwent repeat cardiac cath at that time which showed a patent left main stent with moderate mid LAD stenosis and an occluded D2 with faint collaterals. The RCA was very large with mild mid stenosis. EF was 35-40% with anterior wall hypokinesis. Continued medical therapy was advised. She underwent repeat nuclear stress testing in  02/2017 for recurrent chest pain that showed a similar finding of prior anterior infarct with mild peri-infarct ischemia.  Echo at that time demonstrated an EF of 30 to 35%, diffuse hypokinesis with severe hypokinesis/possible akinesis of the anterior and lateral walls, grade 1 diastolic dysfunction, mild to moderate mitral regurgitation, normal RV systolic function, and mildly elevated PASP.  Most recent echo from 02/2019 demonstrated an EF of 35 to 40%, anterior and septal wall hypokinesis, grade 2 diastolic dysfunction, mildly dilated LV internal cavity size, normal RV systolic function with mildly enlarged ventricular cavity, mildly to moderately dilated left atrium, mildly dilated right atrium, moderately elevated PASP estimated at 51.2 mmHg.   She was seen in the office in 02/2021 and had been out of carvedilol, Entresto, and furosemide.  She was without symptoms of angina or decompensation.  She continued to smoke one fourth a pack of cigarettes per day.  She had gained significant weight over the preceding 6 months.  GDMT was reinitiated.   She was seen in the office in 08/2021 and remained without symptoms of angina or decompensation.  She reported frequently missing her p.m. doses of Entresto and carvedilol.  Blood pressure was well-controlled at home, though BP was elevated in the office at 188/122 which was felt to be in the setting of frequently missing medications and [facility] intake.  She continued to smoke a fourth of a pack of cigarettes per day.  Smoking cessation and medication adherence were recommended.   She was seen in the ED on 05/16/2022 with pain and swelling involving left knee without a known trauma.  Plain film imaging showed  a moderate joint effusion with no acute fracture or dislocation.  She was placed in a hinged knee brace with recommendation to follow-up with orthopedics.   She was seen in the office on 07/04/2022 and had ran out of [place] 3 days prior.  She was taking  clonidine 0.1 mg twice daily.  She reported shortness of breath and lower extremity swelling over the past month with a 10 pound weight gain.  She was taking furosemide 20 mg once daily and had even doubled to 40 mg daily for a few days without significant change.  She reported worsening chest pain.  Her weight was up 8 pounds at that visit when compared to her visit in 05/2022.  Sherryll Burger was refilled and she was transitioned from furosemide to torsemide 40 mg daily.  She was last seen in the office in 11/2022 and noted significant improvement in her dyspnea and resolution of chest pain.  Her weight was down 21 pounds by our scale when compared to her visit in 06/2022.  She did note some dizziness and realizes she has been taking carvedilol 25 mg twice daily rather than 12.5 mg twice daily.  She was also unclear what strength torsemide that she was taking.  Given resolution of symptoms, and in the context of underlying renal dysfunction, cardiac cath was deferred at that time.  She was admitted to the hospital in 12/2022 with pancreatitis complicated by AKI and demand ischemia with a high-sensitivity troponin peaking at 75.  Echo during the admission showed an EF of 35 to 40%, global hypokinesis with severe hypokinesis of the entire anterior and anteroseptal wall, mild LVH, grade 2 diastolic dysfunction, low normal RV systolic function with normal ventricular cavity size, moderate biatrial enlargement, moderate to severe mitral regurgitation, and moderate tricuspid regurgitation.  Renal artery ultrasound with upper limit of normal arterial velocities in the right renal artery suggestive of stenosis approaching 50 to 60% and slightly elevated velocities noted in the mid left renal artery with known stenting of this vessel.  She comes in doing well from a cardiac perspective and is without symptoms of angina or cardiac decompensation.  No dyspnea, palpitations, dizziness, presyncope, or syncope.  No lower extremity  swelling or progressive orthopnea.  Weight is down 2 pounds when compared to her last visit in 11/2022.  She reports that she will drink several beers the first 5 days of the month, then otherwise abstains.  Continues to smoke tobacco, not yet ready to quit.  Reports adherence to cardiac medications when reviewed individually.  Overall, she feels like she is doing well from a cardiac perspective and does not have any acute cardiac concerns at this time.   Labs independently reviewed: 03/2023 - A1c 6.1, Hgb 14.2, PLT 250, BUN 20, serum creatinine 1.26, potassium 3.1, albumin 4.1, AST/ALT normal, TC 136, TG 155, HDL 48, LDL 65, TSH normal  Past Medical History:  Diagnosis Date   Accelerated hypertension 11/13/2018   Allergic rhinitis due to pollen 06/28/2015   Anxiety with depression 02/18/2020   Arthritis    gout   Chest pain 11/12/2018   Chronic combined systolic (congestive) and diastolic (congestive) heart failure (HCC)    a. 02/2017 Echo: EF 30-35%, diff HK, sev HK/poss AK of ant and lat walls. Gr1 DD. Mild to mod MR. Nl RV fxn. Mod TR.   Chronic obstructive pulmonary disease (HCC) 09/02/2019   tx with trelegy and SABA   Chronic systolic heart failure (HCC) 11/21/2017   CKD (chronic kidney disease),  stage IV (HCC)    Controlled substance agreement signed 09/07/2015   Coronary artery disease involving native coronary artery    Degenerative disc disease, cervical 03/17/2015   Depression, recurrent (HCC) 12/02/2017   Essential hypertension, malignant 02/12/2012   GAD (generalized anxiety disorder) 03/17/2015   History of acute anterior wall MI 02/12/2012   Hyperlipidemia    Hypothyroidism    Ischemic cardiomyopathy    a. 02/2012 Echo: EF 40-45%, ant/lat HK, Gr1 DD, Mild MR, PASP ; b. 02/2017 Echo: EF 30-35%.   NSVT (nonsustained ventricular tachycardia) (HCC)    a. 02/2012 post-MI   Obesity    Pancreatitis    Rhinosinusitis 09/02/2019   S/P primary angioplasty with coronary stent  03/04/2018   Dual antiplatelet therapy INDEFINITELY per cardiology note Nov 2019   Squamous cell carcinoma of back 04/20/2019   referrred to sugery for f/up excision, derm not available for several month, lesion with rapid change cannot wait, still see derm for skin survey etc   Stage 3b chronic kidney disease (HCC) 02/18/2020   Tobacco abuse    Tobacco dependence 02/18/2020    Past Surgical History:  Procedure Laterality Date   ABDOMINAL HYSTERECTOMY     BIOPSY  10/30/2022   Procedure: BIOPSY;  Surgeon: Wyline Mood, MD;  Location: [facility] ENDOSCOPY;  Service: Gastroenterology;;   CARDIAC CATHETERIZATION  2013   [facility] s/p stent   CARDIAC CATHETERIZATION Left 08/03/2015   Procedure: Left Heart Cath and Coronary Angiography;  Surgeon: Iran Ouch, MD;  Location: [facility] INVASIVE CV LAB;  Service: Cardiovascular;  Laterality: Left;   COLONOSCOPY WITH PROPOFOL N/A 10/30/2022   Procedure: COLONOSCOPY WITH PROPOFOL;  Surgeon: Wyline Mood, MD;  Location: [facility] ENDOSCOPY;  Service: Gastroenterology;  Laterality: N/A;   DILATION AND CURETTAGE OF UTERUS     ESOPHAGOGASTRODUODENOSCOPY (EGD) WITH PROPOFOL N/A 10/30/2022   Procedure: ESOPHAGOGASTRODUODENOSCOPY (EGD) WITH PROPOFOL;  Surgeon: Wyline Mood, MD;  Location: [facility] ENDOSCOPY;  Service: Gastroenterology;  Laterality: N/A;   LEFT HEART CATHETERIZATION WITH CORONARY ANGIOGRAM N/A 02/11/2012   Procedure: LEFT HEART CATHETERIZATION WITH CORONARY ANGIOGRAM;  Surgeon: Tonny Bollman, MD;  Location: [facility] CATH LAB;  Service: Cardiovascular;  Laterality: N/A;   POLYPECTOMY  10/30/2022   Procedure: POLYPECTOMY;  Surgeon: Wyline Mood, MD;  Location: [facility] ENDOSCOPY;  Service: Gastroenterology;;   RENAL INTERVENTION N/A 11/17/2018   Procedure: RENAL INTERVENTION;  Surgeon: Renford Dills, MD;  Location: [facility] INVASIVE CV LAB;  Service: Cardiovascular;  Laterality: N/A;    Current Medications: Current Meds  Medication Sig   acetaminophen (TYLENOL) 500 MG tablet  Take 1,000 mg by mouth every 6 (six) hours as needed.   aspirin 81 MG tablet Take 1 tablet (81 mg total) by mouth daily.   atorvastatin (LIPITOR) 80 MG tablet Take 1 tablet (80 mg total) by mouth at bedtime.   busPIRone (BUSPAR) 5 MG tablet TAKE 1 TO 3 TABLETS BY MOUTH TWICE DAILY AS NEEDED   carvedilol (COREG) 12.5 MG tablet Take 1 tablet (12.5 mg total) by mouth 2 (two) times daily with a meal.   cetirizine (ZYRTEC) 10 MG tablet TAKE (1) TABLET BY MOUTH  AT BEDTIME   clopidogrel (PLAVIX) 75 MG tablet TAKE (1) TABLET BY MOUTH EVERY DAY   colchicine 0.6 MG tablet Take 1 tablet (0.6 mg total) by mouth daily. Please stop taking colchicine once your gout flare is gone.   gabapentin (NEURONTIN) 300 MG capsule TAKE (1) CAPSULE BY MOUTH TWICE DAILY   levothyroxine (SYNTHROID) 125 MCG tablet TAKE ONE (  1) TABLET BY MOUTH DAILY BEFORE BREAKFAST.   nitroGLYCERIN (NITROSTAT) 0.4 MG SL tablet Place 1 tablet (0.4 mg total) under the tongue every 5 (five) minutes x 3 doses as needed for chest pain.   PARoxetine (PAXIL) 40 MG tablet TAKE (1) TABLET BY MOUTH EVERY DAY   sacubitril-valsartan (ENTRESTO) 97-103 MG Take by mouth.   torsemide (DEMADEX) 20 MG tablet Take by mouth.   Current Facility-Administered Medications for the 07/04/23 encounter (Office Visit) with Sondra Barges, PA-C  Medication   mometasone-formoterol (DULERA) 200-5 MCG/ACT inhaler 2 puff    Allergies:   Patient has no known allergies.   Social History   Socioeconomic History   Marital status: Legally Separated    Spouse name: Not on file   Number of children: Not on file   Years of education: Not on file   Highest education level: Not on file  Occupational History   Not on file  Tobacco Use   Smoking status: Every Day    Current packs/day: 0.25    Average packs/day: 0.3 packs/day for 30.0 years (7.5 ttl pk-yrs)    Types: Cigarettes   Smokeless tobacco: Never  Vaping Use   Vaping status: Never Used  Substance and Sexual  Activity   Alcohol use: Not Currently    Comment: Says she previously drank socially - never a heavy drinker.  No etoh since 08/2018.   Drug use: No    Types: Marijuana   Sexual activity: Not on file  Other Topics Concern   Not on file  Social History Narrative   Lives in [place] w/ her dtr.  Does not routinely exercise.  Disabled/doesn't work.   Social Drivers of Corporate investment banker Strain: Not on file  Food Insecurity: No Food Insecurity (12/24/2022)   Hunger Vital Sign    Worried About Running Out of Food in the Last Year: Never true    Ran Out of Food in the Last Year: Never true  Transportation Needs: No Transportation Needs (12/30/2022)   PRAPARE - Administrator, Civil Service (Medical): No    Lack of Transportation (Non-Medical): No  Physical Activity: Not on file  Stress: Not on file  Social Connections: Not on file     Family History:  The patient's family history includes AAA (abdominal aortic aneurysm) in her father; Cancer in her paternal grandfather; Diabetes in her mother; Hyperlipidemia in her mother; Hypertension in her brother, brother, daughter, father, mother, sister, and sister; Kidney disease in her mother; Supraventricular tachycardia in her sister; Thyroid disease in her father.  ROS:   12-point review of systems is negative unless otherwise noted in the HPI.   EKGs/Labs/Other Studies Reviewed:    Studies reviewed were summarized above. The additional studies were reviewed today:  Renal artery ultrasound 12/27/2022: IMPRESSION: 1. Arterial velocities near the upper limit of normal are noted at the origin of the right renal artery, which could suggest stenosis approaching 50-60%. 2. The patient has a known left renal artery stent. There are slightly elevated velocities noted in the mid left renal artery, which could suggest an element of early in-stent stenosis. This is somewhat further supported by borderline low resistive  indices measured in the arcuate arteries. These findings could be further evaluated with CT angiography if clinically appropriate. __________  2D echo 12/27/2022: 1. Left ventricular ejection fraction, by estimation, is 35 to 40%. Left  ventricular ejection fraction by 2D MOD biplane is 41.7 %. Left  ventricular ejection fraction  by PLAX is 39 %. The left ventricle has  moderately decreased function. The left  ventricle demonstrates global hypokinesis. There is mild left ventricular  hypertrophy. Left ventricular diastolic parameters are consistent with  Grade II diastolic dysfunction (pseudonormalization). There is severe  hypokinesis of the left ventricular,  entire anterior wall and anteroseptal wall.   2. Right ventricular systolic function is low normal. The right  ventricular size is normal.   3. Left atrial size was moderately dilated.   4. Right atrial size was moderately dilated.   5. The mitral valve is normal in structure. Moderate to severe mitral  valve regurgitation.   6. Tricuspid valve regurgitation is moderate.   7. The aortic valve is grossly normal. Aortic valve regurgitation is not  visualized.  __________  Renal artery ultrasound 07/02/2022: Summary:  Largest Aortic Diameter: 2.4 cm    Renal:    Right: Normal size right kidney. No evidence of right renal artery         stenosis. Normal right Resisitive Index. Normal cortical         thickness of right kidney. RRV flow present.  Left:  Normal size of left kidney. No evidence of left renal artery         stenosis. LRV flow present. Normal left Resistive Index.         Normal cortical thickness of the left kidney.  Mesenteric:  Normal Celiac artery and Superior Mesenteric artery findings.  __________   2D echo 02/19/2019:  1. Left ventricular ejection fraction, by visual estimation, is 35 to  40%. The left ventricle has moderately decreased function. There is no  left ventricular hypertrophy. Anterior and  Septal wall hypokinesis.   2. Left ventricular diastolic parameters are consistent with Grade II  diastolic dysfunction (pseudonormalization).   3. Mildly dilated left ventricular internal cavity size.   4. Global right ventricle has normal systolic function.The right  ventricular size is mildly enlarged. No increase in right ventricular wall  thickness.   5. Left atrial size was mild-moderately dilated.   6. Right atrial size was mildly dilated.   7. Moderately elevated pulmonary artery systolic pressure.   8. The tricuspid regurgitant velocity is 2.80 m/s, and with an assumed  right atrial pressure of 20 mmHg, the estimated right ventricular systolic  pressure is moderately elevated at 51.2 mmHg.   9. Small pericardial effusion. __________   2D echo 02/21/2017: - Left ventricle: The cavity size was normal. Systolic function was    moderately to severely reduced. The estimated ejection fraction    was in the range of 30% to 35%. Diffuse hypokinesis with severe    hypokinesis /possible akinesis of the anterior and lateral walls.    Doppler parameters are consistent with abnormal left ventricular    relaxation (grade 1 diastolic dysfunction).  - Mitral valve: There was mild to moderate regurgitation.  - Left atrium: The atrium was normal in size.  - Right ventricle: Systolic function was normal.  - Tricuspid valve: There was moderate regurgitation.  - Pulmonary arteries: Systolic pressure was mildly elevated. __________   Eugenie Birks MPI 02/14/2017: There was no ST segment deviation noted during stress. No T wave inversion was noted during stress. Defect 1: There is a medium defect of severe severity present in the basal anterior, basal anterolateral, mid anterior and mid anterolateral location. This is an intermediate risk study. The left ventricular ejection fraction is moderately decreased (30-44%). Findings consistent with prior myocardial infarction with mild peri-infarct  ischemia.  __________   2D echo 10/06/2015: - Left ventricle: The cavity size was normal. Systolic function was    moderately reduced. The estimated ejection fraction was in the    range of 35% to 40%. Hypokinesis of the anterior myocardium.    Hypokinesis of the anteroseptal myocardium. Doppler parameters    are consistent with abnormal left ventricular relaxation (grade 1    diastolic dysfunction).  - Mitral valve: Calcified annulus. There was mild regurgitation.  - Left atrium: The atrium was normal in size.  - Right ventricle: Systolic function was normal.  - Pulmonary arteries: Systolic pressure was within the normal    range. ___________   LHC 08/03/2015: Ost LM to LM lesion, 10% stenosed. The lesion was previously treated with a stent (unknown type). Mid LAD lesion, 60% stenosed. Mid RCA lesion, 30% stenosed. There is moderate left ventricular systolic dysfunction.   1. Widely patent ostial left main stent with moderate mid LAD stenosis and occluded second diagonal with faint collaterals. The right coronary artery is very large with mild mid stenosis. 2. Moderately reduced LV systolic function with an ejection fraction of 35-40% with significant anterior wall hypokinesis. Mildly elevated left ventricular end-diastolic pressure. 3. Significant radial artery spasm which required increased amounts of sedation.   Recommendations: Continue medical therapy for coronary artery disease and chronic systolic heart failure. We will need to transition her from clonidine to an ACE inhibitor or ARB with close monitoring of renal function. __________   Eugenie Birks MPI 07/28/2015: Pharmacological myocardial perfusion imaging study with attenuation corrected images suggesting mild anterior wall ischemia, mid to apical region Nonattenuation corrected images also with mild mid to apical anterior wall ischemia Patient attempted exercise on bruce protocol though unable to reach target heart rate,  changed to lexiscan Anteroseptal wall hypokinesis noted, EF estimated at 33% No EKG changes concerning for ischemia at peak stress or in recovery. Moderate risk scan with mild ischemia as above __________   2D echo 02/12/2012: - Left ventricle: The cavity size was normal. Wall thickness    was increased in a pattern of mild LVH. Systolic function    was mildly to moderately reduced. The estimated ejection    fraction was in the range of 40% to 45%. Hypokinesis of    the entireanterior and lateral myocardium. Doppler    parameters are consistent with abnormal left ventricular    relaxation (grade 1 diastolic dysfunction).  - Mitral valve: Mild regurgitation.  - Pulmonary arteries: PA peak pressure: 43mm Hg (S).   EKG:  EKG is not ordered today.    Recent Labs: 12/29/2022: Magnesium 2.6 03/20/2023: ALT 17; BUN 20; Creat 1.26; Hemoglobin 14.2; Platelets 250; Potassium 3.1; Sodium 146; TSH 1.08  Recent Lipid Panel    Component Value Date/Time   CHOL 136 03/20/2023 1128   CHOL 136 02/27/2018 1207   TRIG 155 (H) 03/20/2023 1128   HDL 48 (L) 03/20/2023 1128   HDL 53 02/27/2018 1207   CHOLHDL 2.8 03/20/2023 1128   VLDL 15 11/13/2018 0628   LDLCALC 65 03/20/2023 1128    PHYSICAL EXAM:    VS:  BP 110/80   Pulse 63   Ht 5\' 7"  (1.702 m)   Wt 177 lb (80.3 kg)   BMI 27.72 kg/m   BMI: Body mass index is 27.72 kg/m.  Physical Exam Constitutional:      Appearance: She is well-developed.  HENT:     Head: Normocephalic and atraumatic.  Eyes:     General:  Right eye: No discharge.        Left eye: No discharge.  Cardiovascular:     Rate and Rhythm: Normal rate and regular rhythm.     Heart sounds: S1 normal and S2 normal. Heart sounds not distant. No midsystolic click and no opening snap. Murmur heard.     Systolic murmur is present with a grade of 1/6 at the upper left sternal border.     No friction rub.  Pulmonary:     Effort: Pulmonary effort is normal. No respiratory  distress.     Breath sounds: Normal breath sounds. No decreased breath sounds, wheezing, rhonchi or rales.  Chest:     Chest wall: No tenderness.  Musculoskeletal:     Cervical back: Normal range of motion.     Right lower leg: No edema.     Left lower leg: No edema.  Skin:    General: Skin is warm and dry.     Nails: There is no clubbing.  Neurological:     Mental Status: She is alert and oriented to person, place, and time.  Psychiatric:        Speech: Speech normal.        Behavior: Behavior normal.        Thought Content: Thought content normal.        Judgment: Judgment normal.     Wt Readings from Last 3 Encounters:  07/04/23 177 lb (80.3 kg)  07/04/23 177 lb 3.2 oz (80.4 kg)  03/20/23 185 lb (83.9 kg)     ASSESSMENT & PLAN:   CAD involving the native coronary arteries without angina: She is without symptoms of angina or cardiac decompensation.  Mildly elevated high-sensitivity troponin during admission in the fall 2024 likely secondary to supply/demand ischemia in the setting of acute pancreatitis with underlying renal dysfunction.  Defer further testing at this time given lack of anginal symptoms and in the context of underlying renal dysfunction.  Continue aggressive risk factor modification and secondary prevention including aspirin and clopidogrel long-term given previous left main stent.  She will otherwise continue carvedilol and atorvastatin as well.  HFrEF secondary to ICM: Euvolemic and well compensated with NYHA class II-III symptoms.  Continue current GDMT including carvedilol 12.5 mg twice daily, Entresto 97/103 mg twice daily, and torsemide 20 mg daily.  Not currently on MRA given renal dysfunction with prior significant elevation in serum creatinine.  Consider addition of SGLT2 inhibitor moving forward.  Mitral and tricuspid regurgitation: Monitor with periodic echo.  CKD stage IIIb: Most recent renal function improved.  HTN with RAS: Blood pressure is  well-controlled in the office today.  Continue pharmacotherapy as outlined above.  HLD: LDL 65 in 03/2023 with normal AST/ALT at that time.  She remains on atorvastatin 80 mg.  Tobacco and alcohol use: Complete cessation is recommended.    Disposition: F/u with Dr. Kirke Corin or an APP in 6 months.   Medication Adjustments/Labs and Tests Ordered: Current medicines are reviewed at length with the patient today.  Concerns regarding medicines are outlined above. Medication changes, Labs and Tests ordered today are summarized above and listed in the Patient Instructions accessible in Encounters.   Signed, Eula Listen, PA-C 07/04/2023 1:03 PM      [facility] [address] [place], [place] [address] [phone]

## 2023-07-03 NOTE — Telephone Encounter (Signed)
  Chief Complaint: wrist pain Symptoms: warm to touch, swollen  Disposition: [] ED /[] Urgent Care (no appt availability in office) / [x] Appointment(In office/virtual)/ []  [facility]/ [] Home Care/ [] Refused Recommended Disposition /[] [facility]/ []  Follow-up with PCP Additional Notes: Pt calling with Gout flare-up in right wrist that started several days ago. Pt rated pain 10/10. Pt stated wrist is warm to touch and a little swollen.  Pt sates she can't use her hand. Pt is trying to keep it elevated, but it is not helping. Pt refused office visit due to transportation issues. Pt is begging for PCP to call in refill for Colchicine 0.6mg . Pt also stated her myChart is giving her trouble and to please call her with update on medication request. RN gave care advice and pt verbalized understanding.                  Reason for Disposition  [1] SEVERE pain (e.g., excruciating, unable to use hand at all) AND [2] not improved after 2 hours of pain medicine  Answer Assessment - Initial Assessment Questions 1. ONSET: "When did the pain start?"     Few days  2. LOCATION: "Where is the pain located?"     Right wrist  3. PAIN: "How bad is the pain?" (Scale 1-10; or mild, moderate, severe)   - MILD (1-3): doesn't interfere with normal activities   - MODERATE (4-7): interferes with normal activities (e.g., work or school) or awakens from sleep   - SEVERE (8-10): excruciating pain, unable to use hand at all     10  5. CAUSE: "What do you think is causing the pain?"     Gout flare-up  6. AGGRAVATING FACTORS: "What makes the pain worse?" (e.g., using computer)     Any movement  7. OTHER SYMPTOMS: "Do you have any other symptoms?" (e.g., neck pain, swelling, rash, numbness, fever)     Warm to touch and swollen  Protocols used: Hand and Wrist Pain-A-AH

## 2023-07-04 ENCOUNTER — Ambulatory Visit: Payer: Medicaid Other | Attending: Physician Assistant | Admitting: Physician Assistant

## 2023-07-04 ENCOUNTER — Ambulatory Visit (INDEPENDENT_AMBULATORY_CARE_PROVIDER_SITE_OTHER): Admitting: Nurse Practitioner

## 2023-07-04 ENCOUNTER — Encounter: Payer: Self-pay | Admitting: Nurse Practitioner

## 2023-07-04 ENCOUNTER — Encounter: Payer: Self-pay | Admitting: Physician Assistant

## 2023-07-04 VITALS — BP 136/68 | HR 88 | Resp 18 | Ht 67.0 in | Wt 177.2 lb

## 2023-07-04 VITALS — BP 110/80 | HR 63 | Ht 67.0 in | Wt 177.0 lb

## 2023-07-04 DIAGNOSIS — I10 Essential (primary) hypertension: Secondary | ICD-10-CM

## 2023-07-04 DIAGNOSIS — I701 Atherosclerosis of renal artery: Secondary | ICD-10-CM | POA: Diagnosis not present

## 2023-07-04 DIAGNOSIS — N1832 Chronic kidney disease, stage 3b: Secondary | ICD-10-CM | POA: Diagnosis not present

## 2023-07-04 DIAGNOSIS — E785 Hyperlipidemia, unspecified: Secondary | ICD-10-CM

## 2023-07-04 DIAGNOSIS — I255 Ischemic cardiomyopathy: Secondary | ICD-10-CM

## 2023-07-04 DIAGNOSIS — I340 Nonrheumatic mitral (valve) insufficiency: Secondary | ICD-10-CM

## 2023-07-04 DIAGNOSIS — Z789 Other specified health status: Secondary | ICD-10-CM

## 2023-07-04 DIAGNOSIS — Z720 Tobacco use: Secondary | ICD-10-CM

## 2023-07-04 DIAGNOSIS — I071 Rheumatic tricuspid insufficiency: Secondary | ICD-10-CM | POA: Diagnosis not present

## 2023-07-04 DIAGNOSIS — I2510 Atherosclerotic heart disease of native coronary artery without angina pectoris: Secondary | ICD-10-CM

## 2023-07-04 DIAGNOSIS — M10431 Other secondary gout, right wrist: Secondary | ICD-10-CM

## 2023-07-04 DIAGNOSIS — I5022 Chronic systolic (congestive) heart failure: Secondary | ICD-10-CM

## 2023-07-04 MED ORDER — COLCHICINE 0.6 MG PO TABS: ORAL_TABLET | ORAL | 0 refills | Status: DC

## 2023-07-04 NOTE — Patient Instructions (Signed)
 Medication Instructions:  Your physician recommends that you continue on your current medications as directed. Please refer to the Current Medication list given to you today.  *If you need a refill on your cardiac medications before your next appointment, please call your pharmacy*  Follow-Up: At [facility], you and your health needs are our priority.  As part of our continuing mission to provide you with exceptional heart care, we have created designated Provider Care Teams.  These Care Teams include your primary Cardiologist (physician) and Advanced Practice Providers (APPs -  Physician Assistants and Nurse Practitioners) who all work together to provide you with the care you need, when you need it.  We recommend signing up for the patient portal called "MyChart".  Sign up information is provided on this After Visit Summary.  MyChart is used to connect with patients for Virtual Visits (Telemedicine).  Patients are able to view lab/test results, encounter notes, upcoming appointments, etc.  Non-urgent messages can be sent to your provider as well.   To learn more about what you can do with MyChart, go to [email or web address].    Your next appointment:   6 month(s)  Provider:   Eula Listen, PA-C

## 2023-07-04 NOTE — Progress Notes (Signed)
 BP 136/68   Pulse 88   Resp 18   Ht 5\' 7"  (1.702 m)   Wt 177 lb 3.2 oz (80.4 kg)   SpO2 98%   BMI 27.75 kg/m    Subjective:    Patient ID: Angelica Herrera, female    DOB: 02-09-1971, 53 y.o.   MRN: [id number]  HPI: Angelica Herrera is a 53 y.o. female  Patient present to clinic with c/o right wrist pain 8/10 that is worse with movement x 7 days. Patient denies injury to wrist.  Patient reports a history of gout episodes in the past in great toe and ankle. Patient is aware of food triggers and identified alcohol and shrimp as the cause for current flare up.        03/20/2023   10:21 AM 02/17/2023   11:08 AM 07/30/2022    9:03 AM  Depression screen PHQ 2/9  Decreased Interest 0 0 0  Down, Depressed, Hopeless 0 0 0  PHQ - 2 Score 0 0 0  Altered sleeping  0 0  Tired, decreased energy  0 0  Change in appetite  0 0  Feeling bad or failure about yourself   0 0  Trouble concentrating  0 0  Moving slowly or fidgety/restless  0 0  Suicidal thoughts  0 0  PHQ-9 Score  0 0  Difficult doing work/chores  Not difficult at all Not difficult at all    Relevant past medical, surgical, family and social history reviewed and updated as indicated. Interim medical history since our last visit reviewed. Allergies and medications reviewed and updated.  Review of Systems Ten systems reviewed and is negative except as mentioned in HPI  Per HPI unless specifically indicated above     Objective:    BP 136/68   Pulse 88   Resp 18   Ht 5\' 7"  (1.702 m)   Wt 177 lb 3.2 oz (80.4 kg)   SpO2 98%   BMI 27.75 kg/m    Wt Readings from Last 3 Encounters:  07/04/23 177 lb (80.3 kg)  07/04/23 177 lb 3.2 oz (80.4 kg)  03/20/23 185 lb (83.9 kg)    Physical Exam Vitals reviewed.  Constitutional:      Appearance: Normal appearance.  HENT:     Head: Normocephalic.  Cardiovascular:     Rate and Rhythm: Normal rate and regular rhythm.  Pulmonary:     Effort: Pulmonary effort is normal.      Breath sounds: Normal breath sounds.  Musculoskeletal:        General: Normal range of motion.  Skin:    General: Skin is warm and dry.  Neurological:     General: No focal deficit present.     Mental Status: She is alert and oriented to person, place, and time. Mental status is at baseline.  Psychiatric:        Mood and Affect: Mood normal.        Behavior: Behavior normal.        Thought Content: Thought content normal.        Judgment: Judgment normal.     Results for orders placed or performed in visit on 03/20/23  TSH   Collection Time: 03/20/23 11:28 AM  Result Value Ref Range   TSH 1.08 mIU/L  T4   Collection Time: 03/20/23 11:28 AM  Result Value Ref Range   T4, Total 5.8 5.1 - 11.9 mcg/dL  Lipid Profile   Collection Time: 03/20/23 11:28 AM  Result Value Ref Range   Cholesterol 136 <200 mg/dL   HDL 48 (L) > OR = 50 mg/dL   Triglycerides 409 (H) <150 mg/dL   LDL Cholesterol (Calc) 65 mg/dL (calc)   Total CHOL/HDL Ratio 2.8 <5.0 (calc)   Non-HDL Cholesterol (Calc) 88 <811 mg/dL (calc)  COMPLETE METABOLIC PANEL WITH GFR   Collection Time: 03/20/23 11:28 AM  Result Value Ref Range   Glucose, Bld 116 (H) 65 - 99 mg/dL   BUN 20 7 - 25 mg/dL   Creat 9.14 (H) 7.82 - 1.03 mg/dL   eGFR 51 (L) > OR = 60 mL/min/1.58m2   BUN/Creatinine Ratio 16 6 - 22 (calc)   Sodium 146 135 - 146 mmol/L   Potassium 3.1 (L) 3.5 - 5.3 mmol/L   Chloride 105 98 - 110 mmol/L   CO2 30 20 - 32 mmol/L   Calcium 9.5 8.6 - 10.4 mg/dL   Total Protein 6.7 6.1 - 8.1 g/dL   Albumin 4.1 3.6 - 5.1 g/dL   Globulin 2.6 1.9 - 3.7 g/dL (calc)   AG Ratio 1.6 1.0 - 2.5 (calc)   Total Bilirubin 0.4 0.2 - 1.2 mg/dL   Alkaline phosphatase (APISO) 111 37 - 153 U/L   AST 17 10 - 35 U/L   ALT 17 6 - 29 U/L  CBC w/Diff/Platelet   Collection Time: 03/20/23 11:28 AM  Result Value Ref Range   WBC 8.4 3.8 - 10.8 Thousand/uL   RBC 4.87 3.80 - 5.10 Million/uL   Hemoglobin 14.2 11.7 - 15.5 g/dL   HCT 95.6  21.3 - 08.6 %   MCV 87.9 80.0 - 100.0 fL   MCH 29.2 27.0 - 33.0 pg   MCHC 33.2 32.0 - 36.0 g/dL   RDW 57.8 46.9 - 62.9 %   Platelets 250 140 - 400 Thousand/uL   MPV 10.7 7.5 - 12.5 fL   Neutro Abs 6,502 1,500 - 7,800 cells/uL   Absolute Lymphocytes 1,294 850 - 3,900 cells/uL   Absolute Monocytes 403 200 - 950 cells/uL   Eosinophils Absolute 160 15 - 500 cells/uL   Basophils Absolute 42 0 - 200 cells/uL   Neutrophils Relative % 77.4 %   Total Lymphocyte 15.4 %   Monocytes Relative 4.8 %   Eosinophils Relative 1.9 %   Basophils Relative 0.5 %  HgB A1c   Collection Time: 03/20/23 11:28 AM  Result Value Ref Range   Hgb A1c MFr Bld 6.1 (H) <5.7 % of total Hgb   Mean Plasma Glucose 128 mg/dL   eAG (mmol/L) 7.1 mmol/L       Assessment & Plan:   Problem List Items Addressed This Visit   None Visit Diagnoses       Other secondary acute gout of right wrist    -  Primary   Similar food triggered episodes in past, manages diet to avoid flare ups. Colchicine prescribed until pain subsides   Relevant Medications   colchicine 0.6 MG tablet        Follow up plan: Return if symptoms worsen or fail to improve.

## 2023-07-26 DIAGNOSIS — Z419 Encounter for procedure for purposes other than remedying health state, unspecified: Secondary | ICD-10-CM | POA: Diagnosis not present

## 2023-07-31 ENCOUNTER — Other Ambulatory Visit: Payer: Self-pay | Admitting: Physician Assistant

## 2023-08-25 DIAGNOSIS — Z419 Encounter for procedure for purposes other than remedying health state, unspecified: Secondary | ICD-10-CM | POA: Diagnosis not present

## 2023-09-11 ENCOUNTER — Other Ambulatory Visit: Payer: Self-pay | Admitting: Physician Assistant

## 2023-09-11 ENCOUNTER — Other Ambulatory Visit: Payer: Self-pay | Admitting: Cardiovascular Disease

## 2023-09-11 DIAGNOSIS — F411 Generalized anxiety disorder: Secondary | ICD-10-CM

## 2023-09-13 NOTE — Telephone Encounter (Signed)
 Requested Prescriptions  Pending Prescriptions Disp Refills   PARoxetine  (PAXIL ) 40 MG tablet [Pharmacy Med Name: PAROXETINE  HCL 40MG  TABLET] 90 tablet 1    Sig: TAKE (1) TABLET BY MOUTH EVERY DAY     Psychiatry:  Antidepressants - SSRI Passed - 09/13/2023  2:08 PM      Passed - Completed PHQ-2 or PHQ-9 in the last 360 days      Passed - Valid encounter within last 6 months    Recent Outpatient Visits           2 months ago Other secondary acute gout of right wrist   [facility] Quinton Buckler, FNP       Future Appointments             In 5 days Adeline Hone, PA-C [facility], [facility]

## 2023-09-15 ENCOUNTER — Other Ambulatory Visit: Payer: Self-pay | Admitting: Family Medicine

## 2023-09-15 DIAGNOSIS — G8929 Other chronic pain: Secondary | ICD-10-CM

## 2023-09-16 NOTE — Telephone Encounter (Signed)
 Requested Prescriptions  Pending Prescriptions Disp Refills   gabapentin  (NEURONTIN ) 300 MG capsule [Pharmacy Med Name: GABAPENTIN  300MG  CAPSULE] 180 capsule 0    Sig: TAKE (1) CAPSULE BY MOUTH TWICE DAILY     Neurology: Anticonvulsants - gabapentin  Failed - 09/16/2023  1:30 PM      Failed - Cr in normal range and within 360 days    Creat  Date Value Ref Range Status  03/20/2023 1.26 (H) 0.50 - 1.03 mg/dL Final         Passed - Completed PHQ-2 or PHQ-9 in the last 360 days      Passed - Valid encounter within last 12 months    Recent Outpatient Visits           2 months ago Other secondary acute gout of right wrist   [facility] Quinton Buckler, FNP       Future Appointments             In 2 days Adeline Hone, PA-C [facility]

## 2023-09-18 ENCOUNTER — Ambulatory Visit: Payer: Self-pay | Admitting: Family Medicine

## 2023-09-18 DIAGNOSIS — I5042 Chronic combined systolic (congestive) and diastolic (congestive) heart failure: Secondary | ICD-10-CM

## 2023-09-18 DIAGNOSIS — F411 Generalized anxiety disorder: Secondary | ICD-10-CM

## 2023-09-18 DIAGNOSIS — E782 Mixed hyperlipidemia: Secondary | ICD-10-CM

## 2023-09-18 DIAGNOSIS — N184 Chronic kidney disease, stage 4 (severe): Secondary | ICD-10-CM

## 2023-09-18 DIAGNOSIS — I10 Essential (primary) hypertension: Secondary | ICD-10-CM

## 2023-09-18 DIAGNOSIS — J441 Chronic obstructive pulmonary disease with (acute) exacerbation: Secondary | ICD-10-CM

## 2023-09-18 DIAGNOSIS — I2510 Atherosclerotic heart disease of native coronary artery without angina pectoris: Secondary | ICD-10-CM

## 2023-09-18 DIAGNOSIS — F3341 Major depressive disorder, recurrent, in partial remission: Secondary | ICD-10-CM

## 2023-09-25 DIAGNOSIS — Z419 Encounter for procedure for purposes other than remedying health state, unspecified: Secondary | ICD-10-CM | POA: Diagnosis not present

## 2023-09-28 ENCOUNTER — Emergency Department

## 2023-09-28 ENCOUNTER — Other Ambulatory Visit: Payer: Self-pay

## 2023-09-28 ENCOUNTER — Inpatient Hospital Stay
Admission: EM | Admit: 2023-09-28 | Discharge: 2023-10-05 | DRG: 553 | Disposition: A | Discharge status: Home / Self Care | Attending: Student | Admitting: Student

## 2023-09-28 DIAGNOSIS — I5022 Chronic systolic (congestive) heart failure: Secondary | ICD-10-CM | POA: Diagnosis not present

## 2023-09-28 DIAGNOSIS — E785 Hyperlipidemia, unspecified: Secondary | ICD-10-CM | POA: Diagnosis present

## 2023-09-28 DIAGNOSIS — Z7982 Long term (current) use of aspirin: Secondary | ICD-10-CM

## 2023-09-28 DIAGNOSIS — M25562 Pain in left knee: Secondary | ICD-10-CM | POA: Diagnosis not present

## 2023-09-28 DIAGNOSIS — E278 Other specified disorders of adrenal gland: Secondary | ICD-10-CM | POA: Diagnosis not present

## 2023-09-28 DIAGNOSIS — E039 Hypothyroidism, unspecified: Secondary | ICD-10-CM | POA: Diagnosis not present

## 2023-09-28 DIAGNOSIS — Z6825 Body mass index (BMI) 25.0-25.9, adult: Secondary | ICD-10-CM

## 2023-09-28 DIAGNOSIS — M109 Gout, unspecified: Secondary | ICD-10-CM | POA: Diagnosis not present

## 2023-09-28 DIAGNOSIS — F32A Depression, unspecified: Secondary | ICD-10-CM | POA: Diagnosis not present

## 2023-09-28 DIAGNOSIS — E669 Obesity, unspecified: Secondary | ICD-10-CM | POA: Diagnosis not present

## 2023-09-28 DIAGNOSIS — N184 Chronic kidney disease, stage 4 (severe): Secondary | ICD-10-CM | POA: Diagnosis present

## 2023-09-28 DIAGNOSIS — N12 Tubulo-interstitial nephritis, not specified as acute or chronic: Secondary | ICD-10-CM

## 2023-09-28 DIAGNOSIS — F17210 Nicotine dependence, cigarettes, uncomplicated: Secondary | ICD-10-CM | POA: Diagnosis present

## 2023-09-28 DIAGNOSIS — N10 Acute tubulo-interstitial nephritis: Secondary | ICD-10-CM | POA: Insufficient documentation

## 2023-09-28 DIAGNOSIS — Z7902 Long term (current) use of antithrombotics/antiplatelets: Secondary | ICD-10-CM

## 2023-09-28 DIAGNOSIS — I2510 Atherosclerotic heart disease of native coronary artery without angina pectoris: Secondary | ICD-10-CM | POA: Diagnosis present

## 2023-09-28 DIAGNOSIS — Z79899 Other long term (current) drug therapy: Secondary | ICD-10-CM

## 2023-09-28 DIAGNOSIS — I255 Ischemic cardiomyopathy: Secondary | ICD-10-CM | POA: Diagnosis present

## 2023-09-28 DIAGNOSIS — R112 Nausea with vomiting, unspecified: Secondary | ICD-10-CM | POA: Diagnosis not present

## 2023-09-28 DIAGNOSIS — Z955 Presence of coronary angioplasty implant and graft: Secondary | ICD-10-CM

## 2023-09-28 DIAGNOSIS — J441 Chronic obstructive pulmonary disease with (acute) exacerbation: Secondary | ICD-10-CM

## 2023-09-28 DIAGNOSIS — Z1152 Encounter for screening for COVID-19: Secondary | ICD-10-CM | POA: Diagnosis not present

## 2023-09-28 DIAGNOSIS — N179 Acute kidney failure, unspecified: Secondary | ICD-10-CM | POA: Diagnosis not present

## 2023-09-28 DIAGNOSIS — A419 Sepsis, unspecified organism: Secondary | ICD-10-CM | POA: Diagnosis not present

## 2023-09-28 DIAGNOSIS — D631 Anemia in chronic kidney disease: Secondary | ICD-10-CM | POA: Diagnosis present

## 2023-09-28 DIAGNOSIS — R300 Dysuria: Secondary | ICD-10-CM | POA: Diagnosis not present

## 2023-09-28 DIAGNOSIS — E872 Acidosis, unspecified: Secondary | ICD-10-CM | POA: Diagnosis present

## 2023-09-28 DIAGNOSIS — R531 Weakness: Secondary | ICD-10-CM | POA: Diagnosis not present

## 2023-09-28 DIAGNOSIS — M25462 Effusion, left knee: Secondary | ICD-10-CM | POA: Diagnosis not present

## 2023-09-28 DIAGNOSIS — F419 Anxiety disorder, unspecified: Secondary | ICD-10-CM | POA: Diagnosis present

## 2023-09-28 DIAGNOSIS — G629 Polyneuropathy, unspecified: Secondary | ICD-10-CM | POA: Diagnosis present

## 2023-09-28 DIAGNOSIS — J9601 Acute respiratory failure with hypoxia: Secondary | ICD-10-CM | POA: Diagnosis not present

## 2023-09-28 DIAGNOSIS — N209 Urinary calculus, unspecified: Secondary | ICD-10-CM | POA: Diagnosis not present

## 2023-09-28 DIAGNOSIS — I130 Hypertensive heart and chronic kidney disease with heart failure and stage 1 through stage 4 chronic kidney disease, or unspecified chronic kidney disease: Secondary | ICD-10-CM | POA: Diagnosis present

## 2023-09-28 DIAGNOSIS — Z79890 Hormone replacement therapy (postmenopausal): Secondary | ICD-10-CM

## 2023-09-28 DIAGNOSIS — I252 Old myocardial infarction: Secondary | ICD-10-CM | POA: Diagnosis not present

## 2023-09-28 DIAGNOSIS — E876 Hypokalemia: Principal | ICD-10-CM | POA: Diagnosis present

## 2023-09-28 DIAGNOSIS — I959 Hypotension, unspecified: Secondary | ICD-10-CM | POA: Diagnosis not present

## 2023-09-28 DIAGNOSIS — R160 Hepatomegaly, not elsewhere classified: Secondary | ICD-10-CM | POA: Diagnosis not present

## 2023-09-28 DIAGNOSIS — J449 Chronic obstructive pulmonary disease, unspecified: Secondary | ICD-10-CM | POA: Diagnosis present

## 2023-09-28 DIAGNOSIS — M1A09X0 Idiopathic chronic gout, multiple sites, without tophus (tophi): Secondary | ICD-10-CM | POA: Diagnosis present

## 2023-09-28 DIAGNOSIS — Z789 Other specified health status: Secondary | ICD-10-CM | POA: Diagnosis not present

## 2023-09-28 HISTORY — DX: Acute pyelonephritis: N10

## 2023-09-28 LAB — BASIC METABOLIC PANEL WITH GFR
Anion gap: 15 (ref 5–15)
BUN: 29 mg/dL — ABNORMAL HIGH (ref 6–20)
CO2: 27 mmol/L (ref 22–32)
Calcium: 9 mg/dL (ref 8.9–10.3)
Chloride: 96 mmol/L — ABNORMAL LOW (ref 98–111)
Creatinine, Ser: 1.94 mg/dL — ABNORMAL HIGH (ref 0.44–1.00)
GFR, Estimated: 30 mL/min — ABNORMAL LOW (ref >60)
Glucose, Bld: 109 mg/dL — ABNORMAL HIGH (ref 70–99)
Potassium: 3.2 mmol/L — ABNORMAL LOW (ref 3.5–5.1)
Sodium: 138 mmol/L (ref 135–145)

## 2023-09-28 LAB — RESP PANEL BY RT-PCR (RSV, FLU A&B, COVID)  RVPGX2
Influenza A by PCR: NEGATIVE
Influenza B by PCR: NEGATIVE
Resp Syncytial Virus by PCR: NEGATIVE
SARS Coronavirus 2 by RT PCR: NEGATIVE

## 2023-09-28 LAB — TROPONIN I (HIGH SENSITIVITY)
Troponin I (High Sensitivity): 26 ng/L — ABNORMAL HIGH (ref <18)
Troponin I (High Sensitivity): 24 ng/L — ABNORMAL HIGH (ref <18)

## 2023-09-28 LAB — URINALYSIS, ROUTINE W REFLEX MICROSCOPIC
Bilirubin Urine: NEGATIVE
Glucose, UA: NEGATIVE mg/dL
Hgb urine dipstick: NEGATIVE
Ketones, ur: NEGATIVE mg/dL
Nitrite: NEGATIVE
Protein, ur: 100 mg/dL — AB
Specific Gravity, Urine: 1.028 (ref 1.005–1.030)
WBC, UA: >50 WBC/hpf (ref 0–5)
pH: 5 (ref 5.0–8.0)

## 2023-09-28 LAB — CBC WITH DIFFERENTIAL/PLATELET
Abs Immature Granulocytes: 0.02 10*3/uL (ref 0.00–0.07)
Basophils Absolute: 0 10*3/uL (ref 0.0–0.1)
Basophils Relative: 0 %
Eosinophils Absolute: 0 10*3/uL (ref 0.0–0.5)
Eosinophils Relative: 0 %
HCT: 50.8 % — ABNORMAL HIGH (ref 36.0–46.0)
Hemoglobin: 16.1 g/dL — ABNORMAL HIGH (ref 12.0–15.0)
Immature Granulocytes: 0 %
Lymphocytes Relative: 8 %
Lymphs Abs: 0.8 10*3/uL (ref 0.7–4.0)
MCH: 27.8 pg (ref 26.0–34.0)
MCHC: 31.7 g/dL (ref 30.0–36.0)
MCV: 87.6 fL (ref 80.0–100.0)
Monocytes Absolute: 0.9 10*3/uL (ref 0.1–1.0)
Monocytes Relative: 8 %
Neutro Abs: 8.7 10*3/uL — ABNORMAL HIGH (ref 1.7–7.7)
Neutrophils Relative %: 84 %
Platelets: 362 10*3/uL (ref 150–400)
RBC: 5.8 MIL/uL — ABNORMAL HIGH (ref 3.87–5.11)
RDW: 15.8 % — ABNORMAL HIGH (ref 11.5–15.5)
WBC: 10.4 10*3/uL (ref 4.0–10.5)
nRBC: 0 % (ref 0.0–0.2)

## 2023-09-28 LAB — COMPREHENSIVE METABOLIC PANEL WITH GFR
ALT: 10 U/L (ref 0–44)
AST: 20 U/L (ref 15–41)
Albumin: 4.3 g/dL (ref 3.5–5.0)
Alkaline Phosphatase: 99 U/L (ref 38–126)
Anion gap: 18 — ABNORMAL HIGH (ref 5–15)
BUN: 26 mg/dL — ABNORMAL HIGH (ref 6–20)
CO2: 29 mmol/L (ref 22–32)
Calcium: 10 mg/dL (ref 8.9–10.3)
Chloride: 89 mmol/L — ABNORMAL LOW (ref 98–111)
Creatinine, Ser: 1.83 mg/dL — ABNORMAL HIGH (ref 0.44–1.00)
GFR, Estimated: 32 mL/min — ABNORMAL LOW (ref >60)
Glucose, Bld: 124 mg/dL — ABNORMAL HIGH (ref 70–99)
Potassium: 2.4 mmol/L — CL (ref 3.5–5.1)
Sodium: 136 mmol/L (ref 135–145)
Total Bilirubin: 1.6 mg/dL — ABNORMAL HIGH (ref 0.0–1.2)
Total Protein: 8.9 g/dL — ABNORMAL HIGH (ref 6.5–8.1)

## 2023-09-28 LAB — LACTIC ACID, PLASMA
Lactic Acid, Venous: 2.7 mmol/L (ref 0.5–1.9)
Lactic Acid, Venous: 1.8 mmol/L (ref 0.5–1.9)

## 2023-09-28 LAB — MAGNESIUM: Magnesium: 1.9 mg/dL (ref 1.7–2.4)

## 2023-09-28 LAB — PHOSPHORUS: Phosphorus: 2.8 mg/dL (ref 2.5–4.6)

## 2023-09-28 LAB — LIPASE, BLOOD: Lipase: 28 U/L (ref 11–51)

## 2023-09-28 MED ORDER — LORATADINE 10 MG PO TABS
10.0000 mg | ORAL_TABLET | Freq: Every day | ORAL | Status: DC
  Filled 2023-09-28: qty 1

## 2023-09-28 MED ORDER — LEVOTHYROXINE SODIUM 50 MCG PO TABS
125.0000 ug | ORAL_TABLET | Freq: Every day | ORAL | Status: DC
  Administered 2023-09-29 – 2023-10-05 (×7): 125 ug via ORAL
  Filled 2023-09-28 (×7): qty 3

## 2023-09-28 MED ORDER — CARVEDILOL 6.25 MG PO TABS
6.2500 mg | ORAL_TABLET | Freq: Two times a day (BID) | ORAL | Status: DC
  Administered 2023-09-28 – 2023-09-29 (×2): 6.25 mg via ORAL
  Filled 2023-09-28 (×2): qty 1

## 2023-09-28 MED ORDER — SODIUM CHLORIDE 0.9 % IV SOLN
2.0000 g | INTRAVENOUS | Status: DC
  Administered 2023-09-29 – 2023-10-03 (×5): 2 g via INTRAVENOUS
  Filled 2023-09-28 (×5): qty 20

## 2023-09-28 MED ORDER — ASPIRIN 81 MG PO TBEC
81.0000 mg | DELAYED_RELEASE_TABLET | Freq: Every day | ORAL | Status: DC
  Administered 2023-09-28 – 2023-10-05 (×8): 81 mg via ORAL
  Filled 2023-09-28 (×8): qty 1

## 2023-09-28 MED ORDER — METOCLOPRAMIDE HCL 5 MG/ML IJ SOLN
10.0000 mg | Freq: Once | INTRAMUSCULAR | Status: AC
  Administered 2023-09-28: 10 mg via INTRAVENOUS
  Filled 2023-09-28: qty 2

## 2023-09-28 MED ORDER — GABAPENTIN 300 MG PO CAPS
300.0000 mg | ORAL_CAPSULE | Freq: Two times a day (BID) | ORAL | Status: DC
  Administered 2023-09-28 – 2023-10-05 (×15): 300 mg via ORAL
  Filled 2023-09-28 (×15): qty 1

## 2023-09-28 MED ORDER — ATORVASTATIN CALCIUM 20 MG PO TABS
80.0000 mg | ORAL_TABLET | Freq: Every day | ORAL | Status: DC
  Administered 2023-09-29 – 2023-10-04 (×6): 80 mg via ORAL
  Filled 2023-09-28 (×7): qty 4

## 2023-09-28 MED ORDER — FLUTICASONE FUROATE-VILANTEROL 100-25 MCG/ACT IN AEPB
1.0000 | INHALATION_SPRAY | Freq: Every day | RESPIRATORY_TRACT | Status: DC
  Administered 2023-09-29 – 2023-10-05 (×7): 1 via RESPIRATORY_TRACT
  Filled 2023-09-28: qty 28

## 2023-09-28 MED ORDER — POTASSIUM CHLORIDE 10 MEQ/100ML IV SOLN
10.0000 meq | Freq: Once | INTRAVENOUS | Status: AC
  Administered 2023-09-28: 10 meq via INTRAVENOUS
  Filled 2023-09-28: qty 100

## 2023-09-28 MED ORDER — ACETAMINOPHEN 500 MG PO TABS
1000.0000 mg | ORAL_TABLET | Freq: Once | ORAL | Status: AC
  Administered 2023-09-28: 1000 mg via ORAL
  Filled 2023-09-28: qty 2

## 2023-09-28 MED ORDER — ONDANSETRON HCL 4 MG/2ML IJ SOLN
4.0000 mg | Freq: Four times a day (QID) | INTRAMUSCULAR | Status: DC | PRN
  Administered 2023-09-28 – 2023-10-02 (×4): 4 mg via INTRAVENOUS
  Filled 2023-09-28 (×4): qty 2

## 2023-09-28 MED ORDER — HYDROMORPHONE HCL 1 MG/ML IJ SOLN
0.5000 mg | INTRAMUSCULAR | Status: DC | PRN
  Administered 2023-09-28 – 2023-09-29 (×4): 0.5 mg via INTRAVENOUS
  Filled 2023-09-28 (×4): qty 0.5

## 2023-09-28 MED ORDER — HEPARIN SODIUM (PORCINE) 5000 UNIT/ML IJ SOLN
5000.0000 [IU] | Freq: Two times a day (BID) | INTRAMUSCULAR | Status: DC
  Administered 2023-09-28 – 2023-10-04 (×12): 5000 [IU] via SUBCUTANEOUS
  Filled 2023-09-28 (×14): qty 1

## 2023-09-28 MED ORDER — ACETAMINOPHEN 325 MG PO TABS
650.0000 mg | ORAL_TABLET | Freq: Four times a day (QID) | ORAL | Status: DC | PRN
  Administered 2023-09-29 – 2023-10-02 (×4): 650 mg via ORAL
  Filled 2023-09-28 (×4): qty 2

## 2023-09-28 MED ORDER — HYDRALAZINE HCL 20 MG/ML IJ SOLN
5.0000 mg | Freq: Four times a day (QID) | INTRAMUSCULAR | Status: DC | PRN
  Administered 2023-10-03: 5 mg via INTRAVENOUS
  Filled 2023-09-28 (×2): qty 1

## 2023-09-28 MED ORDER — IOHEXOL 300 MG/ML  SOLN
100.0000 mL | Freq: Once | INTRAMUSCULAR | Status: AC | PRN
  Administered 2023-09-28: 80 mL via INTRAVENOUS

## 2023-09-28 MED ORDER — BUSPIRONE HCL 5 MG PO TABS
5.0000 mg | ORAL_TABLET | Freq: Three times a day (TID) | ORAL | Status: DC
  Administered 2023-09-28: 5 mg via ORAL
  Filled 2023-09-28 (×2): qty 1

## 2023-09-28 MED ORDER — ONDANSETRON HCL 4 MG/2ML IJ SOLN
4.0000 mg | Freq: Once | INTRAMUSCULAR | Status: AC
  Administered 2023-09-28: 4 mg via INTRAVENOUS
  Filled 2023-09-28: qty 2

## 2023-09-28 MED ORDER — MORPHINE SULFATE (PF) 4 MG/ML IV SOLN
4.0000 mg | Freq: Once | INTRAVENOUS | Status: AC
  Administered 2023-09-28: 4 mg via INTRAVENOUS
  Filled 2023-09-28: qty 1

## 2023-09-28 MED ORDER — POTASSIUM CHLORIDE 20 MEQ PO PACK
40.0000 meq | PACK | Freq: Two times a day (BID) | ORAL | Status: DC
  Administered 2023-09-28 – 2023-09-29 (×3): 40 meq via ORAL
  Filled 2023-09-28 (×3): qty 2

## 2023-09-28 MED ORDER — PAROXETINE HCL 20 MG PO TABS
40.0000 mg | ORAL_TABLET | Freq: Every day | ORAL | Status: DC
  Administered 2023-09-29 – 2023-10-05 (×7): 40 mg via ORAL
  Filled 2023-09-28 (×7): qty 2

## 2023-09-28 MED ORDER — CLOPIDOGREL BISULFATE 75 MG PO TABS
75.0000 mg | ORAL_TABLET | Freq: Every day | ORAL | Status: DC
  Administered 2023-09-29 – 2023-10-05 (×7): 75 mg via ORAL
  Filled 2023-09-28 (×7): qty 1

## 2023-09-28 MED ORDER — MORPHINE SULFATE (PF) 2 MG/ML IV SOLN
2.0000 mg | Freq: Once | INTRAVENOUS | Status: AC
  Administered 2023-09-28: 2 mg via INTRAVENOUS
  Filled 2023-09-28: qty 1

## 2023-09-28 MED ORDER — SODIUM CHLORIDE 0.9 % IV BOLUS
500.0000 mL | Freq: Once | INTRAVENOUS | Status: AC
  Administered 2023-09-28: 500 mL via INTRAVENOUS

## 2023-09-28 MED ORDER — SODIUM CHLORIDE 0.9 % IV SOLN
2.0000 g | Freq: Once | INTRAVENOUS | Status: AC
  Administered 2023-09-28: 2 g via INTRAVENOUS
  Filled 2023-09-28: qty 20

## 2023-09-28 MED ORDER — MAGNESIUM SULFATE 2 GM/50ML IV SOLN
2.0000 g | Freq: Once | INTRAVENOUS | Status: AC
  Administered 2023-09-28: 2 g via INTRAVENOUS
  Filled 2023-09-28: qty 50

## 2023-09-28 NOTE — H&P (Signed)
 History and Physical    Angelica Herrera:[id number] DOB: Sep 08, 1970 DOA: 09/28/2023  PCP: Adeline Hone, PA-C (Confirm with patient/family/NH records and if not entered, this has to be entered at [facility] point of entry) Patient coming from: Home  I have personally briefly reviewed patient's old medical records in [facility]  Chief Complaint: Right flank pain, dysuria, nauseous vomiting, chills  HPI: Angelica Herrera is a 53 y.o. female with medical history significant of CAD stenting, chronic HFrEF with LVEF 35-40%, renal artery stenosis status post renal artery stenting, CKD stage IIIb, HTN, GERD, anxiety/depression, chronic peripheral neuropathy, presented with worsening of dysuria right flank pain nauseous vomiting.  Symptoms started 5 days ago, patient started to have dysuria followed by new onset of right-sided flank pain radiating to right groin area, sharp-like, episode of subjective fever and chills.  Also started to have significant nausea with vomiting of stomach content and decreased p.o. intake due to nausea.  No diarrhea denies any chest pain or shortness of breath or cough. ED Course: Afebrile, blood pressure 140/80 O2 saturation 95% room air.  CT abdomen pelvis negative for acute findings, UA showed 3+ WBC 1+ RBC, WBC 10.6 hemoglobin 16 BUN 26 creatinine 1.8 compared to baseline 1.2-2.0, lactic acid 2.7> 1.8, K2.4 bicarb 29.  Patient was given IV bolus, ceftriaxone , IV and p.o. KCl.  Review of Systems: As per HPI otherwise 14 point review of systems negative.    Past Medical History:  Diagnosis Date   Accelerated hypertension 11/13/2018   Allergic rhinitis due to pollen 06/28/2015   Anxiety with depression 02/18/2020   Arthritis    gout   Chest pain 11/12/2018   Chronic combined systolic (congestive) and diastolic (congestive) heart failure (HCC)    a. 02/2017 Echo: EF 30-35%, diff HK, sev HK/poss AK of ant and lat walls. Gr1 DD. Mild to mod MR. Nl RV fxn. Mod TR.    Chronic obstructive pulmonary disease (HCC) 09/02/2019   tx with trelegy and SABA   Chronic systolic heart failure (HCC) 11/21/2017   CKD (chronic kidney disease), stage IV (HCC)    Controlled substance agreement signed 09/07/2015   Coronary artery disease involving native coronary artery    Degenerative disc disease, cervical 03/17/2015   Depression, recurrent (HCC) 12/02/2017   Essential hypertension, malignant 02/12/2012   GAD (generalized anxiety disorder) 03/17/2015   History of acute anterior wall MI 02/12/2012   Hyperlipidemia    Hypothyroidism    Ischemic cardiomyopathy    a. 02/2012 Echo: EF 40-45%, ant/lat HK, Gr1 DD, Mild MR, PASP ; b. 02/2017 Echo: EF 30-35%.   NSVT (nonsustained ventricular tachycardia) (HCC)    a. 02/2012 post-MI   Obesity    Pancreatitis    Rhinosinusitis 09/02/2019   S/P primary angioplasty with coronary stent 03/04/2018   Dual antiplatelet therapy INDEFINITELY per cardiology note Nov 2019   Squamous cell carcinoma of back 04/20/2019   referrred to sugery for f/up excision, derm not available for several month, lesion with rapid change cannot wait, still see derm for skin survey etc   Stage 3b chronic kidney disease (HCC) 02/18/2020   Tobacco abuse    Tobacco dependence 02/18/2020    Past Surgical History:  Procedure Laterality Date   ABDOMINAL HYSTERECTOMY     BIOPSY  10/30/2022   Procedure: BIOPSY;  Surgeon: Luke Salaam, MD;  Location: [facility] ENDOSCOPY;  Service: Gastroenterology;;   CARDIAC CATHETERIZATION  2013   [facility] s/p stent   CARDIAC CATHETERIZATION Left 08/03/2015  Procedure: Left Heart Cath and Coronary Angiography;  Surgeon: Wenona Hamilton, MD;  Location: [facility] INVASIVE CV LAB;  Service: Cardiovascular;  Laterality: Left;   COLONOSCOPY WITH PROPOFOL  N/A 10/30/2022   Procedure: COLONOSCOPY WITH PROPOFOL ;  Surgeon: Luke Salaam, MD;  Location: [facility] ENDOSCOPY;  Service: Gastroenterology;  Laterality: N/A;   DILATION AND CURETTAGE OF UTERUS      ESOPHAGOGASTRODUODENOSCOPY (EGD) WITH PROPOFOL  N/A 10/30/2022   Procedure: ESOPHAGOGASTRODUODENOSCOPY (EGD) WITH PROPOFOL ;  Surgeon: Luke Salaam, MD;  Location: [facility] ENDOSCOPY;  Service: Gastroenterology;  Laterality: N/A;   LEFT HEART CATHETERIZATION WITH CORONARY ANGIOGRAM N/A 02/11/2012   Procedure: LEFT HEART CATHETERIZATION WITH CORONARY ANGIOGRAM;  Surgeon: Arnoldo Lapping, MD;  Location: [facility] CATH LAB;  Service: Cardiovascular;  Laterality: N/A;   POLYPECTOMY  10/30/2022   Procedure: POLYPECTOMY;  Surgeon: Luke Salaam, MD;  Location: [facility] ENDOSCOPY;  Service: Gastroenterology;;   RENAL INTERVENTION N/A 11/17/2018   Procedure: RENAL INTERVENTION;  Surgeon: Jackquelyn Mass, MD;  Location: [facility] INVASIVE CV LAB;  Service: Cardiovascular;  Laterality: N/A;     reports that she has been smoking cigarettes. She has a 7.5 pack-year smoking history. She has never used smokeless tobacco. She reports that she does not currently use alcohol. She reports that she does not use drugs.  No Known Allergies  Family History  Problem Relation Age of Onset   Hypertension Father    Thyroid  disease Father    AAA (abdominal aortic aneurysm) Father    Hypertension Mother    Hyperlipidemia Mother    Diabetes Mother    Kidney disease Mother    Hypertension Sister    Hypertension Sister    Supraventricular tachycardia Sister    Hypertension Brother    Hypertension Brother    Hypertension Daughter    Cancer Paternal Grandfather        lung     Prior to Admission medications   Medication Sig Start Date End Date Taking? Authorizing Provider  carvedilol  (COREG ) 12.5 MG tablet TAKE ONE (1) TABLET (12.5 MG TOTAL) BY MOUTH TWO (2) (TWO) TIMES DAILY WITH A MEAL. 07/31/23   Dunn, Elvia Hammans, PA-C  acetaminophen  (TYLENOL ) 500 MG tablet Take 1,000 mg by mouth every 6 (six) hours as needed.    [provider]  aspirin  81 MG tablet Take 1 tablet (81 mg total) by mouth daily. 02/15/12   Florette Hurry, NP  atorvastatin  (LIPITOR ) 80 MG tablet Take 1 tablet (80 mg total) by mouth at bedtime. 03/20/23   Mecum, Erin E, PA-C  busPIRone  (BUSPAR ) 5 MG tablet TAKE 1 TO 3 TABLETS BY MOUTH TWICE DAILY AS NEEDED 02/18/23   Tapia, Leisa, PA-C  cetirizine  (ZYRTEC ) 10 MG tablet TAKE (1) TABLET BY MOUTH  AT BEDTIME 11/15/22   Tapia, Leisa, PA-C  clopidogrel  (PLAVIX ) 75 MG tablet TAKE (1) TABLET BY MOUTH EVERY DAY 03/20/23   Mecum, Erin E, PA-C  colchicine  0.6 MG tablet Take 2 tabs for initial dose, then take 1 tab daily until pain subsides 07/04/23   Pender, Julie F, FNP  gabapentin  (NEURONTIN ) 300 MG capsule TAKE (1) CAPSULE BY MOUTH TWICE DAILY 09/16/23   Tapia, Leisa, PA-C  levothyroxine  (SYNTHROID ) 125 MCG tablet TAKE ONE (1) TABLET BY MOUTH DAILY BEFORE BREAKFAST. 06/18/23   Tapia, Leisa, PA-C  nitroGLYCERIN  (NITROSTAT ) 0.4 MG SL tablet Place 1 tablet (0.4 mg total) under the tongue every 5 (five) minutes x 3 doses as needed for chest pain. 08/25/19   Walker, Caitlin S, NP  PARoxetine  (PAXIL )  40 MG tablet TAKE (1) TABLET BY MOUTH EVERY DAY 09/13/23   Tapia, Leisa, PA-C  sacubitril -valsartan  (ENTRESTO ) 97-103 MG TAKE ONE (1) TABLET BY MOUTH TWO (2) (TWO) TIMES DAILY. 09/11/23   Wenona Hamilton, MD  torsemide  (DEMADEX ) 20 MG tablet Take by mouth. 12/17/22   [provider]    Physical Exam: Vitals:   09/28/23 1200 09/28/23 1230 09/28/23 1300 09/28/23 1330  BP: (!) 152/95 (!) 152/91 134/87 138/84  Pulse: 74 63 65 66  Resp: (!) 22 (!) 8 (!) 9 11  Temp:      TempSrc:      SpO2: 99% 96% 93% 95%  Weight:      Height:        Constitutional: NAD, calm, comfortable Vitals:   09/28/23 1200 09/28/23 1230 09/28/23 1300 09/28/23 1330  BP: (!) 152/95 (!) 152/91 134/87 138/84  Pulse: 74 63 65 66  Resp: (!) 22 (!) 8 (!) 9 11  Temp:      TempSrc:      SpO2: 99% 96% 93% 95%  Weight:      Height:       Eyes: PERRL, lids and conjunctivae normal ENMT: Mucous membranes are moist. Posterior pharynx clear  of any exudate or lesions.Normal dentition.  Neck: normal, supple, no masses, no thyromegaly Respiratory: clear to auscultation bilaterally, no wheezing, no crackles. Normal respiratory effort. No accessory muscle use.  Cardiovascular: Regular rate and rhythm, no murmurs / rubs / gallops. No extremity edema. 2+ pedal pulses. No carotid bruits.  Abdomen: right CVA tenderness, no masses palpated. No hepatosplenomegaly. Bowel sounds positive.  Musculoskeletal: no clubbing / cyanosis. No joint deformity upper and lower extremities. Good ROM, no contractures. Normal muscle tone.  Skin: no rashes, lesions, ulcers. No induration Neurologic: CN 2-12 grossly intact. Sensation intact, DTR normal. Strength 5/5 in all 4.  Psychiatric: Normal judgment and insight. Alert and oriented x 3. Normal mood.     Labs on Admission: I have personally reviewed following labs and imaging studies  CBC: Recent Labs  Lab 09/28/23 1142  WBC 10.4  NEUTROABS 8.7*  HGB 16.1*  HCT 50.8*  MCV 87.6  PLT 362   Basic Metabolic Panel: Recent Labs  Lab 09/28/23 1142  NA 136  K 2.4*  CL 89*  CO2 29  GLUCOSE 124*  BUN 26*  CREATININE 1.83*  CALCIUM  10.0   GFR: Estimated Creatinine Clearance: 34.2 mL/min (A) (by C-G formula based on SCr of 1.83 mg/dL (H)). Liver Function Tests: Recent Labs  Lab 09/28/23 1142  AST 20  ALT 10  ALKPHOS 99  BILITOT 1.6*  PROT 8.9*  ALBUMIN 4.3   Recent Labs  Lab 09/28/23 1142  LIPASE 28   No results for input(s): AMMONIA in the last 168 hours. Coagulation Profile: No results for input(s): INR, PROTIME in the last 168 hours. Cardiac Enzymes: No results for input(s): CKTOTAL, CKMB, CKMBINDEX, TROPONINI in the last 168 hours. BNP (last 3 results) No results for input(s): PROBNP in the last 8760 hours. HbA1C: No results for input(s): HGBA1C in the last 72 hours. CBG: No results for input(s): GLUCAP in the last 168 hours. Lipid Profile: No  results for input(s): CHOL, HDL, LDLCALC, TRIG, CHOLHDL, LDLDIRECT in the last 72 hours. Thyroid  Function Tests: No results for input(s): TSH, T4TOTAL, FREET4, T3FREE, THYROIDAB in the last 72 hours. Anemia Panel: No results for input(s): VITAMINB12, FOLATE, FERRITIN, TIBC, IRON, RETICCTPCT in the last 72 hours. Urine analysis:    Component Value Date/Time  COLORURINE YELLOW (A) 09/28/2023 1142   APPEARANCEUR CLOUDY (A) 09/28/2023 1142   LABSPEC 1.028 09/28/2023 1142   PHURINE 5.0 09/28/2023 1142   GLUCOSEU NEGATIVE 09/28/2023 1142   HGBUR NEGATIVE 09/28/2023 1142   BILIRUBINUR NEGATIVE 09/28/2023 1142   BILIRUBINUR Negative 05/31/2022 1115   KETONESUR NEGATIVE 09/28/2023 1142   PROTEINUR 100 (A) 09/28/2023 1142   UROBILINOGEN 0.2 05/31/2022 1115   UROBILINOGEN 0.2 02/12/2012 1541   NITRITE NEGATIVE 09/28/2023 1142   LEUKOCYTESUR LARGE (A) 09/28/2023 1142    Radiological Exams on Admission: CT ABDOMEN PELVIS W CONTRAST Result Date: 09/28/2023 CLINICAL DATA:  RLQ, R flank pain. +dysuria. Eval for appendicitis, urolithiasis, pyelonephritis EXAM: CT ABDOMEN AND PELVIS WITH CONTRAST TECHNIQUE: Multidetector CT imaging of the abdomen and pelvis was performed using the standard protocol following bolus administration of intravenous contrast. RADIATION DOSE REDUCTION: This exam was performed according to the departmental dose-optimization program which includes automated exposure control, adjustment of the mA and/or kV according to patient size and/or use of iterative reconstruction technique. CONTRAST:  80mL OMNIPAQUE  IOHEXOL  300 MG/ML  SOLN COMPARISON:  CT abdomen pelvis 12/24/2022 FINDINGS: Lower chest: At least trace to small volume pericardial effusion partially visualized. Hepatobiliary: Liver limb mildly enlarged measuring up to 18.5 cm. No focal liver abnormality. No gallstones, gallbladder wall thickening, or pericholecystic fluid. No biliary dilatation.  Pancreas: No focal lesion. Normal pancreatic contour. No surrounding inflammatory changes. No main pancreatic ductal dilatation. Spleen: Normal in size without focal abnormality. Adrenals/Urinary Tract: Bilateral adrenal gland nodules measuring 1 cm on the right with density of 45 Hounsfield units and 1.6 cm on the left with density of 38 Hounsfield units. Bilateral kidneys enhance symmetrically. No hydronephrosis. No hydroureter.  No nephroureterolithiasis. The urinary bladder is unremarkable. Stomach/Bowel: Stomach is within normal limits. No evidence of bowel wall thickening or dilatation. Appendix appears normal. Vascular/Lymphatic: No abdominal aorta or iliac aneurysm. Severe atherosclerotic plaque of the aorta and its branches. No abdominal, pelvic, or inguinal lymphadenopathy. Reproductive: There is a 2 cm x 1.5 cm fluid density lesion along the anterior wall of the vagina head is suggestive of a Gardner cyst. Status post hysterectomy. No adnexal masses. Other: No intraperitoneal free fluid. No intraperitoneal free gas. No organized fluid collection. Musculoskeletal: No abdominal wall hernia or abnormality. No suspicious lytic or blastic osseous lesions. No acute displaced fracture. L5-S1 intervertebral disc space vacuum phenomenon and endplate sclerosis. IMPRESSION: 1. No acute intra-abdominal or intrapelvic abnormality. 2. Partially visualized at least trace to small volume pericardial effusion. 3. Incidental 2 cm Gartner cyst. 4. 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 5. Recommend follow-up adrenal washout CT in 1 year. If stable for ? 1 year, no further follow-up imaging. JACR 2017 Aug; 14(8):1038-44, JCAT 2016 Mar-Apr; 40(2):194-200, Urol J 2006 Spring; 3(2):71-4. 6.  Aortic Atherosclerosis (ICD10-I70.0). Electronically Signed   By: Morgane  Naveau M.D.   On: 09/28/2023 14:20   DG Knee Complete 4 Views Left Result Date: 09/28/2023 CLINICAL DATA:  L  knee pain/swelling EXAM: LEFT KNEE - COMPLETE 4+ VIEW COMPARISON:  May 16, 2022 FINDINGS: No acute fracture or dislocation. Joint spaces and alignment are maintained. No area of erosion or osseous destruction. No unexpected radiopaque foreign body. Large joint effusion. IMPRESSION: Large joint effusion. No acute fracture or dislocation. Electronically Signed   By: Clancy Crimes M.D.   On: 09/28/2023 11:55    EKG: Independently reviewed.  Sinus rhythm, no acute ST changes.  Assessment/Plan Principal Problem:   Sepsis (HCC)  Active Problems:   Acute pyelonephritis  (please populate well all problems here in Problem List. (For example, if patient is on BP meds at home and you resume or decide to hold them, it is a problem that needs to be her. Same for CAD, COPD, HLD and so on)  Acute pyelonephritis Complicated UTI - Continue ceftriaxone  - Urine cultures pending  SIRS Elevated lactic acid - Responded to initial IV bolus.  Given the history of low LVEF CHF, will not give further IV boluses for maintenance IV fluid.  Severe hypokalemia - IV and p.o. replacement - Magnesium  and phosphorus level pending - Recheck potassium level tonight  Chronic HFrEF - Volume contracted - Decrease Coreg  dosage from 12.5 to 6.25 mg twice daily - Hold off Entresto  and torsemide   CKD stage IIIb - Volume contracted, creatinine level stable - Hold off diuresis  History of CAD - No acute concern - Continue aspirin  Plavix  atorvastatin   Chronic peripheral neuropathy - No acute concern, continue gabapentin   DVT prophylaxis: Heparin  subcu Code Status: Full code Family Communication: None at bedside Disposition Plan: Patient is sick with acute pyelonephritis requiring IV antibiotics and severe electrolyte imbalance requiring close monitoring clinical recovery, expect more than 2 midnight hospital stay Consults called: None Admission status: Telemetry admission   [place] MD [facility] Pager [phone]  09/28/2023, 2:55 PM

## 2023-09-28 NOTE — ED Triage Notes (Signed)
 Pt brought in from home by EMS for generalized weakness and pain all over x 5 days. Pt also complains of pain with urination and nausea & vomiting x 5 days.

## 2023-09-28 NOTE — ED Provider Notes (Addendum)
 [facility] Provider Note    Event Date/Time   First MD Initiated Contact with Patient 09/28/23 1108     (approximate)   History   Generalized Body Aches  Pt brought in from home by EMS for generalized weakness and pain all over x 5 days. Pt also complains of pain with urination and nausea & vomiting x 5 days.    HPI Angelica Herrera is a 53 y.o. female CHF, COPD, ischemic cardiomyopathy, CKD, anemia of chronic disease, GAD, CAD with prior STEMI (2013) with DES, left renal artery stenosis status post left renal artery stent (2020), tobacco use presents for evaluation of weakness, right flank/abdominal discomfort, dysuria, vomiting - Past several days patient has had generalized weakness, vomiting over the past 4 days, dysuria.  Notes some right flank and left inguinal discomfort. - Separately notes that over the past week she has had a growing bump just above her left knee that is painful.  No preceding trauma.  Endorses a history of gout.      Physical Exam   BP 138/84   Pulse 66   Temp 97.9 F (36.6 C) (Oral)   Resp 11   Ht 5' 7 (1.702 m)   Wt 73 kg   SpO2 95%   BMI 25.21 kg/m   Most recent vital signs: Vitals:   09/28/23 1300 09/28/23 1330  BP: 134/87 138/84  Pulse: 65 66  Resp: (!) 9 11  Temp:    SpO2: 93% 95%     General: Awake, no distress.  Appears uncomfortable but nontoxic CV:  Good peripheral perfusion. RRR, RP 2+ Resp:  Normal effort. CTAB Abd:  No distention.  Moderate tenderness palpation in right lower quadrant and right mid abdomen, mild right upper quadrant, no significant left sided abdominal discomfort.+ Right CVAT LLE:  +effusion to R knee, not warm, not particularly tender, no overlying erythema, no gross deformity   ED Results / Procedures / Treatments   Labs (all labs ordered are listed, but only abnormal results are displayed) Labs Reviewed  COMPREHENSIVE METABOLIC PANEL WITH GFR - Abnormal; Notable for  the following components:      Result Value   Potassium 2.4 (*)    Chloride 89 (*)    Glucose, Bld 124 (*)    BUN 26 (*)    Creatinine, Ser 1.83 (*)    Total Protein 8.9 (*)    Total Bilirubin 1.6 (*)    GFR, Estimated 32 (*)    Anion gap 18 (*)    All other components within normal limits  URINALYSIS, ROUTINE W REFLEX MICROSCOPIC - Abnormal; Notable for the following components:   Color, Urine YELLOW (*)    APPearance CLOUDY (*)    Protein, ur 100 (*)    Leukocytes,Ua LARGE (*)    Bacteria, UA RARE (*)    All other components within normal limits  CBC WITH DIFFERENTIAL/PLATELET - Abnormal; Notable for the following components:   RBC 5.80 (*)    Hemoglobin 16.1 (*)    HCT 50.8 (*)    RDW 15.8 (*)    Neutro Abs 8.7 (*)    All other components within normal limits  LACTIC ACID, PLASMA - Abnormal; Notable for the following components:   Lactic Acid, Venous 2.7 (*)    All other components within normal limits  TROPONIN I (HIGH SENSITIVITY) - Abnormal; Notable for the following components:   Troponin I (High Sensitivity) 26 (*)    All other components within  normal limits  TROPONIN I (HIGH SENSITIVITY) - Abnormal; Notable for the following components:   Troponin I (High Sensitivity) 24 (*)    All other components within normal limits  RESP PANEL BY RT-PCR (RSV, FLU A&B, COVID)  RVPGX2  CULTURE, BLOOD (SINGLE)  CULTURE, BLOOD (SINGLE)  LACTIC ACID, PLASMA  LIPASE, BLOOD  MAGNESIUM   PHOSPHORUS  BASIC METABOLIC PANEL WITH GFR     EKG  See ED course below.   RADIOLOGY Radiology interpreted by myself and radiology reports reviewed.  No acute pathology appreciated.    PROCEDURES:  Critical Care performed: No  Procedures   MEDICATIONS ORDERED IN ED: Medications  potassium chloride  (KLOR-CON ) packet 40 mEq (40 mEq Oral Given 09/28/23 1420)  potassium chloride  10 mEq in 100 mL IVPB (has no administration in time range)  potassium chloride  10 mEq in 100 mL IVPB (10  mEq Intravenous New Bag/Given 09/28/23 1428)  magnesium  sulfate IVPB 2 g 50 mL (2 g Intravenous New Bag/Given 09/28/23 1429)  cefTRIAXone  (ROCEPHIN ) 2 g in sodium chloride  0.9 % 100 mL IVPB (has no administration in time range)  ondansetron  (ZOFRAN ) injection 4 mg (has no administration in time range)  HYDROmorphone  (DILAUDID ) injection 0.5 mg (has no administration in time range)  aspirin  tablet 81 mg (has no administration in time range)  atorvastatin  (LIPITOR ) tablet 80 mg (has no administration in time range)  carvedilol  (COREG ) tablet 6.25 mg (has no administration in time range)  busPIRone  (BUSPAR ) tablet 5 mg (has no administration in time range)  hydrALAZINE  (APRESOLINE ) injection 5 mg (has no administration in time range)  PARoxetine  (PAXIL ) tablet 40 mg (has no administration in time range)  levothyroxine  (SYNTHROID ) tablet 125 mcg (has no administration in time range)  clopidogrel  (PLAVIX ) tablet 75 mg (has no administration in time range)  gabapentin  (NEURONTIN ) capsule 300 mg (has no administration in time range)  loratadine  (CLARITIN ) tablet 10 mg (has no administration in time range)  fluticasone  furoate-vilanterol (BREO ELLIPTA) 100-25 MCG/ACT 1 puff (has no administration in time range)  heparin  injection 5,000 Units (has no administration in time range)  acetaminophen  (TYLENOL ) tablet 650 mg (has no administration in time range)  sodium chloride  0.9 % bolus 500 mL (0 mLs Intravenous Stopped 09/28/23 1348)  morphine  (PF) 2 MG/ML injection 2 mg (2 mg Intravenous Given 09/28/23 1152)  ondansetron  (ZOFRAN ) injection 4 mg (4 mg Intravenous Given 09/28/23 1152)  acetaminophen  (TYLENOL ) tablet 1,000 mg (1,000 mg Oral Given 09/28/23 1152)  cefTRIAXone  (ROCEPHIN ) 2 g in sodium chloride  0.9 % 100 mL IVPB (0 g Intravenous Stopped 09/28/23 1317)  iohexol  (OMNIPAQUE ) 300 MG/ML solution 100 mL (80 mLs Intravenous Contrast Given 09/28/23 1401)  morphine  (PF) 4 MG/ML injection 4 mg (4 mg  Intravenous Given 09/28/23 1420)  metoCLOPramide  (REGLAN ) injection 10 mg (10 mg Intravenous Given 09/28/23 1418)     IMPRESSION / MDM / ASSESSMENT AND PLAN / ED COURSE  I reviewed the triage vital signs and the nursing notes.                              DDX/MDM/AP: Differential diagnosis includes, but is not limited to, UTI/pyelonephritis, consider urolithiasis, appendicitis, less likely biliary pathology.  Consider viral syndrome including influenza or COVID-19.  Considered but doubt ACS.  With regard to left knee, consider effusion versus cyst versus hematoma versus gout, doubt underlying fracture or dislocation, do not clinically suspect septic joint at this time.  Plan: - N.p.o. -  Labs - EKG - X-ray left knee - Pain control, antiemetic, small bolus IV fluid - Tentative plan for abdominal imaging, will decide whether to use contrast based on renal function today  Patient's presentation is most consistent with acute presentation with potential threat to life or bodily function.  The patient is on the cardiac monitor to evaluate for evidence of arrhythmia and/or significant heart rate changes.  ED course below.  Workup with evidence of UTI and elevated lactate, given reduced modified fluid bolus of 500 cc given history of underlying CHF and lactate less than 4, no hypotension.  Treated with ceftriaxone .  Hypokalemia down to 2.4, QTc less than 500.  No underlying obstruction, but ascites.  Admitted to hospitalist service.    Clinical Course as of 09/28/23 1518  Sun Sep 28, 2023  1201 XR L knee: IMPRESSION: Large joint effusion. No acute fracture or dislocation.   [MM]  1254 Ecg = sinus rhythm, rate 78, no gross ST elevation or depression, no significant repolarization abnormality appreciated.  LVH.  QTc borderline prolonged at 490. [MM]  1254 CMP with notable hypokalemia, will replete p.o. and IV as well as empirically replenish magnesium .  Lactate elevated at 2.7.   Normotensive here.  History of cardiomyopathy with reduced EF, received 500 cc bolus IV fluid, will defer more aggressive fluid resuscitation at this time. [MM]  1432 CTAP: IMPRESSION: 1. No acute intra-abdominal or intrapelvic abnormality. 2. Partially visualized at least trace to small volume pericardial effusion. 3. Incidental 2 cm Gartner cyst. 4. 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 5. Recommend follow-up adrenal washout CT in 1 year.    [MM]  1436 Hospitalist consult order placed [MM]  1436 Lactic Acid, Venous: 1.8 Rpt lactate normalized [MM]    Clinical Course User Index [MM] Collis Deaner, MD     FINAL CLINICAL IMPRESSION(S) / ED DIAGNOSES   Final diagnoses:  Hypokalemia  Pyelonephritis  Nausea and vomiting, unspecified vomiting type     Rx / DC Orders   ED Discharge Orders     None        Note:  This document was prepared using Dragon voice recognition software and may include unintentional dictation errors.   Collis Deaner, MD 09/28/23 1517    Collis Deaner, MD 09/28/23 [phone]

## 2023-09-28 NOTE — ED Notes (Signed)
 Advised nurse that patient has ready bed

## 2023-09-29 ENCOUNTER — Inpatient Hospital Stay

## 2023-09-29 ENCOUNTER — Ambulatory Visit: Admitting: Family Medicine

## 2023-09-29 ENCOUNTER — Telehealth: Payer: Self-pay | Admitting: Family Medicine

## 2023-09-29 DIAGNOSIS — R0602 Shortness of breath: Secondary | ICD-10-CM | POA: Diagnosis not present

## 2023-09-29 DIAGNOSIS — A419 Sepsis, unspecified organism: Secondary | ICD-10-CM

## 2023-09-29 DIAGNOSIS — R0989 Other specified symptoms and signs involving the circulatory and respiratory systems: Secondary | ICD-10-CM | POA: Diagnosis not present

## 2023-09-29 DIAGNOSIS — R918 Other nonspecific abnormal finding of lung field: Secondary | ICD-10-CM | POA: Diagnosis not present

## 2023-09-29 DIAGNOSIS — I517 Cardiomegaly: Secondary | ICD-10-CM | POA: Diagnosis not present

## 2023-09-29 LAB — URIC ACID: Uric Acid, Serum: 14.4 mg/dL — ABNORMAL HIGH (ref 2.5–7.1)

## 2023-09-29 LAB — BASIC METABOLIC PANEL WITH GFR
Anion gap: 12 (ref 5–15)
BUN: 33 mg/dL — ABNORMAL HIGH (ref 6–20)
CO2: 27 mmol/L (ref 22–32)
Calcium: 8.8 mg/dL — ABNORMAL LOW (ref 8.9–10.3)
Chloride: 98 mmol/L (ref 98–111)
Creatinine, Ser: 1.84 mg/dL — ABNORMAL HIGH (ref 0.44–1.00)
GFR, Estimated: 32 mL/min — ABNORMAL LOW (ref >60)
Glucose, Bld: 104 mg/dL — ABNORMAL HIGH (ref 70–99)
Potassium: 3.6 mmol/L (ref 3.5–5.1)
Sodium: 137 mmol/L (ref 135–145)

## 2023-09-29 LAB — BRAIN NATRIURETIC PEPTIDE: B Natriuretic Peptide: 245.7 pg/mL — ABNORMAL HIGH (ref 0.0–100.0)

## 2023-09-29 LAB — VITAMIN B12: Vitamin B-12: 462 pg/mL (ref 180–914)

## 2023-09-29 MED ORDER — COLCHICINE 0.6 MG PO TABS
0.6000 mg | ORAL_TABLET | Freq: Once | ORAL | Status: AC
  Administered 2023-09-29: 0.6 mg via ORAL
  Filled 2023-09-29: qty 1

## 2023-09-29 MED ORDER — HYDROMORPHONE HCL 1 MG/ML IJ SOLN
0.5000 mg | INTRAMUSCULAR | Status: DC | PRN
  Administered 2023-09-29 – 2023-10-05 (×16): 0.5 mg via INTRAVENOUS
  Filled 2023-09-29 (×16): qty 0.5

## 2023-09-29 MED ORDER — HYDROMORPHONE HCL 1 MG/ML IJ SOLN: 1.0000 mg | INTRAMUSCULAR | Status: DC | PRN

## 2023-09-29 MED ORDER — ALPRAZOLAM 0.5 MG PO TABS
0.5000 mg | ORAL_TABLET | Freq: Two times a day (BID) | ORAL | Status: DC | PRN
  Administered 2023-09-29 – 2023-10-05 (×10): 0.5 mg via ORAL
  Filled 2023-09-29 (×10): qty 1

## 2023-09-29 MED ORDER — CARVEDILOL 6.25 MG PO TABS
6.2500 mg | ORAL_TABLET | Freq: Two times a day (BID) | ORAL | Status: DC
  Administered 2023-09-30: 6.25 mg via ORAL
  Filled 2023-09-29: qty 1

## 2023-09-29 MED ORDER — DOCUSATE SODIUM 100 MG PO CAPS
100.0000 mg | ORAL_CAPSULE | Freq: Every day | ORAL | Status: DC
  Administered 2023-09-29 – 2023-10-05 (×7): 100 mg via ORAL
  Filled 2023-09-29 (×7): qty 1

## 2023-09-29 MED ORDER — IPRATROPIUM-ALBUTEROL 0.5-2.5 (3) MG/3ML IN SOLN: 3.0000 mL | Freq: Four times a day (QID) | RESPIRATORY_TRACT | Status: DC | PRN

## 2023-09-29 MED ORDER — MIDODRINE HCL 5 MG PO TABS
10.0000 mg | ORAL_TABLET | Freq: Three times a day (TID) | ORAL | Status: DC
  Administered 2023-09-29 – 2023-10-02 (×6): 10 mg via ORAL
  Filled 2023-09-29 (×8): qty 2

## 2023-09-29 MED ORDER — PREDNISONE 20 MG PO TABS
40.0000 mg | ORAL_TABLET | Freq: Every day | ORAL | Status: AC
  Administered 2023-10-02 – 2023-10-04 (×3): 40 mg via ORAL
  Filled 2023-09-29 (×3): qty 2

## 2023-09-29 MED ORDER — COLCHICINE 0.3 MG HALF TABLET
0.3000 mg | ORAL_TABLET | Freq: Every day | ORAL | Status: DC
  Administered 2023-09-30 – 2023-10-04 (×5): 0.3 mg via ORAL
  Filled 2023-09-29 (×6): qty 1

## 2023-09-29 MED ORDER — POLYETHYLENE GLYCOL 3350 17 G PO PACK
17.0000 g | PACK | Freq: Every day | ORAL | Status: DC | PRN
  Administered 2023-09-29 – 2023-10-01 (×2): 17 g via ORAL
  Filled 2023-09-29 (×2): qty 1

## 2023-09-29 MED ORDER — METHYLPREDNISOLONE SODIUM SUCC 40 MG IJ SOLR
40.0000 mg | Freq: Every day | INTRAMUSCULAR | Status: AC
  Administered 2023-09-30 – 2023-10-01 (×2): 40 mg via INTRAVENOUS
  Filled 2023-09-29 (×2): qty 1

## 2023-09-29 MED ORDER — OXYCODONE HCL 5 MG PO TABS
5.0000 mg | ORAL_TABLET | Freq: Four times a day (QID) | ORAL | Status: DC | PRN
  Administered 2023-09-29 – 2023-10-03 (×13): 5 mg via ORAL
  Filled 2023-09-29 (×13): qty 1

## 2023-09-29 MED ORDER — METHYLPREDNISOLONE SODIUM SUCC 40 MG IJ SOLR
40.0000 mg | Freq: Two times a day (BID) | INTRAMUSCULAR | Status: AC
  Administered 2023-09-29 (×2): 40 mg via INTRAVENOUS
  Filled 2023-09-29 (×2): qty 1

## 2023-09-29 NOTE — TOC CM/SW Note (Signed)
 Transition of Care [facility]) - Inpatient Brief Assessment   Patient Details  Name: Angelica Herrera MRN: [id number] Date of Birth: Mar 03, 1971  Transition of Care [facility]) CM/SW Contact:    Loman Risk, RN Phone Number: 09/29/2023, 12:00 PM   Clinical Narrative:   Transition of Care [facility]) Screening Note   Patient Details  Name: Angelica Herrera Date of Birth: [birth year]   [facility]) CM/SW Contact:    Loman Risk, RN Phone Number: 09/29/2023, 12:00 PM    Transition of Care Department [facility]) has reviewed patient and no TOC needs have been identified at this time. If new patient transition needs arise, please place a TOC consult.    Transition of Care Asessment: Insurance and Status: Insurance coverage has been reviewed Patient has primary care physician: Yes     Prior/Current Home Services: No current home services Social Drivers of Health Review: SDOH reviewed no interventions necessary Readmission risk has been reviewed: Yes Transition of care needs: no transition of care needs at this time

## 2023-09-29 NOTE — Telephone Encounter (Signed)
 Pt wanted to give you the medications and directions:   Lorazepam  1mg  take 1 tablet by mouth 2x daily  Olazapine 10mg  tablets take 1 by mouth nightly  Olanzapine 2.5 mg take 1 tablet 4x daily as needed  Lithium 300mg  take 2 tablets by mouth every 12 hrs

## 2023-09-29 NOTE — Progress Notes (Signed)
 Pt's Room air SpO2 78%. No SOB or difficulty breathiing or coughing. 3L Frio 95%.

## 2023-09-29 NOTE — Progress Notes (Addendum)
 [facility] Progress Note  Patient: Angelica Herrera    ZOX:[id number]  DOA: 09/28/2023     Date of Service: the patient was seen and examined on 09/29/2023  Chief Complaint  Patient presents with   Generalized Body Aches   Brief hospital course: Angelica Herrera is a 53 y.o. female with medical history significant of CAD stenting, chronic HFrEF with LVEF 35-40%, renal artery stenosis status post renal artery stenting, CKD stage IIIb, HTN, GERD, anxiety/depression, chronic peripheral neuropathy, presented with worsening of dysuria right flank pain nauseous vomiting.   Symptoms started 5 days ago, patient started to have dysuria followed by new onset of right-sided flank pain radiating to right groin area, sharp-like, episode of subjective fever and chills.  Also started to have significant nausea with vomiting of stomach content and decreased p.o. intake due to nausea.  No diarrhea denies any chest pain or shortness of breath or cough. ED Course: Afebrile, blood pressure 140/80 O2 saturation 95% room air.  CT abdomen pelvis negative for acute findings, UA showed 3+ WBC 1+ RBC, WBC 10.6 hemoglobin 16 BUN 26 creatinine 1.8 compared to baseline 1.2-2.0, lactic acid 2.7> 1.8, K2.4 bicarb 29.   Patient was given IV bolus, ceftriaxone , IV and p.o. KCl.   Assessment and Plan:  # Acute UTI CT a/p: No acute intra-abdominal or intrapelvic abnormality. 2. Partially visualized at least trace to small volume pericardial effusion. - Continue ceftriaxone  - Urine cultures pending  # Acute hypoxic respiratory failure due to acute pulmonary edema could be due to IV fluid given due to sepsis CXR: Cardiomegaly with central pulmonary vascular congestion and streaky right basilar opacities, which could reflect asymmetric edema or atelectasis. Continue supplemental O2 inhalation Provided IV Lasix  due to renal failure BNP 245 slightly elevated Follow 2D echocardiogram  # SIRS Elevated lactic  acid - Responded to initial IV bolus.  Given the history of low LVEF CHF, will not give further IV boluses for maintenance IV fluid.   # Severe hypokalemia - IV and p.o. replacement - Magnesium  and phosphorus levels wnl - Recheck potassium level 3.6   # Chronic HFrEF - Volume contracted - Decrease Coreg  dosage from 12.5 to 6.25 mg twice daily - Hold off Entresto  and torsemide    # CKD stage IIIb - Volume contracted, creatinine level stable - Hold off diuresis Cr 1.84   # History of CAD - No acute concern - Continue Aspirin , Plavix  and Atorvastatin    # Chronic peripheral neuropathy - No acute concern, continue gabapentin   # Left knee gout  Uric acid 14.4 elevated Started Solu-Medrol  40 mg IV twice daily followed by oral prednisone  Started colchicine  0.6 mg x 1 dose followed by 0.3 mg p.o. 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  Recommended to follow-up with PCP   Body mass index is 25.21 kg/m.  Interventions:   Diet: Heart healthy diet DVT Prophylaxis: Subcutaneous Heparin     Advance goals of care discussion: Full code  Family Communication: family was not present at bedside, at the time of interview.  The pt provided permission to discuss medical plan with the family. Opportunity was given to ask question and all questions were answered satisfactorily.   Disposition:  Pt is from Home, admitted with UTI and Left knee Gout, still has pian and on IV Abx, which precludes a safe  discharge. Discharge to Home, when stable may need few days to improve.  Subjective: No significant events overnight, patient is still on persistent pain mostly complaining of pain in the left knee and also pain in the back.  Patient felt improvement after steroids and  colchicine  as per RN.  Physical Exam: General: NAD, lying comfortably Appear in no distress, affect appropriate Eyes: PERRLA ENT: Oral Mucosa Clear, moist  Neck: no JVD,  Cardiovascular: S1 and S2 Present, no Murmur,  Respiratory: good respiratory effort, Bilateral Air entry equal and Decreased, no Crackles, no wheezes Abdomen: Bowel Sound present, Soft and no tenderness,  Skin: no rashes Extremities: Left knee tender and swollen, no significant erythema, less likely septic arthritis.  Most likely joint effusion due to gout Neurologic: without any new focal findings Gait not checked due to patient safety concerns  Vitals:   09/28/23 1920 09/29/23 0332 09/29/23 0815 09/29/23 1412  BP: 108/71 118/69 (!) 81/66 97/61  Pulse: (!) 59 76 80 71  Resp: 20 16 16    Temp: (!) 97.5 F (36.4 C) 99.2 F (37.3 C) 98.4 F (36.9 C)   TempSrc: Oral Oral Oral   SpO2: 96% 93% 94%   Weight:      Height:        Intake/Output Summary (Last 24 hours) at 09/29/2023 1616 Last data filed at 09/29/2023 1522 Gross per 24 hour  Intake 240 ml  Output 300 ml  Net -60 ml   Filed Weights   09/28/23 1112  Weight: 73 kg    Data Reviewed: I have personally reviewed and interpreted daily labs, tele strips, imagings as discussed above. I reviewed all nursing notes, pharmacy notes, vitals, pertinent old records I have discussed plan of care as described above with RN and patient/family.  CBC: Recent Labs  Lab 09/28/23 1142  WBC 10.4  NEUTROABS 8.7*  HGB 16.1*  HCT 50.8*  MCV 87.6  PLT 362   Basic Metabolic Panel: Recent Labs  Lab 09/28/23 1142 09/28/23 1336 09/28/23 2111 09/29/23 0359  NA 136  --  138 137  K 2.4*  --  3.2* 3.6  CL 89*  --  96* 98  CO2 29  --  27 27  GLUCOSE 124*  --  109* 104*  BUN 26*  --  29* 33*  CREATININE 1.83*  --  1.94* 1.84*  CALCIUM  10.0  --  9.0 8.8*  MG  --  1.9  --   --   PHOS  --  2.8  --   --     Studies: No results found.  Scheduled Meds:  aspirin   EC  81 mg Oral Daily   atorvastatin   80 mg Oral QHS   [START ON 09/30/2023] carvedilol   6.25 mg Oral BID WC   clopidogrel   75 mg Oral Daily   [START ON 09/30/2023] colchicine   0.3 mg Oral Daily   docusate sodium  100 mg Oral Daily   fluticasone  furoate-vilanterol  1 puff Inhalation Daily   gabapentin   300 mg Oral BID   heparin   5,000 Units Subcutaneous Q12H   levothyroxine   125 mcg Oral Q0600   methylPREDNISolone  (SOLU-MEDROL ) injection  40 mg Intravenous BID   Followed by   Cecily Cohen ON 09/30/2023] methylPREDNISolone  (SOLU-MEDROL ) injection  40 mg Intravenous Daily   Followed by   Cecily Cohen ON 10/02/2023] predniSONE   40 mg Oral Q breakfast   PARoxetine   40 mg Oral Daily   Continuous Infusions:  cefTRIAXone  (ROCEPHIN )  IV 2 g (09/29/23 1010)  PRN Meds: acetaminophen , ALPRAZolam , hydrALAZINE , HYDROmorphone  (DILAUDID ) injection, ipratropium-albuterol , ondansetron  (ZOFRAN ) IV, oxyCODONE , polyethylene glycol  Time spent: 55 minutes  Author: Althia Atlas. MD [facility] 09/29/2023 4:16 PM  To reach On-call, see care teams to locate the attending and reach out to them via [email or web address]. If 7PM-7AM, please contact night-coverage If you still have difficulty reaching the attending provider, please page the [facility] (Director on Call) for [facility] on amion for assistance.

## 2023-09-29 NOTE — Telephone Encounter (Signed)
 Not sure why the pt wanted us  to have a copy of these meds Inpt meds are often different than outpt management and if these meds need to be continued outpt it would likely be by psychiatry not primary care

## 2023-09-30 ENCOUNTER — Inpatient Hospital Stay

## 2023-09-30 DIAGNOSIS — A419 Sepsis, unspecified organism: Secondary | ICD-10-CM | POA: Diagnosis not present

## 2023-09-30 DIAGNOSIS — N289 Disorder of kidney and ureter, unspecified: Secondary | ICD-10-CM | POA: Diagnosis not present

## 2023-09-30 DIAGNOSIS — M25462 Effusion, left knee: Secondary | ICD-10-CM | POA: Diagnosis not present

## 2023-09-30 LAB — BASIC METABOLIC PANEL WITH GFR
Anion gap: 14 (ref 5–15)
Anion gap: 14 (ref 5–15)
BUN: 49 mg/dL — ABNORMAL HIGH (ref 6–20)
BUN: 50 mg/dL — ABNORMAL HIGH (ref 6–20)
CO2: 25 mmol/L (ref 22–32)
CO2: 25 mmol/L (ref 22–32)
Calcium: 8.8 mg/dL — ABNORMAL LOW (ref 8.9–10.3)
Calcium: 8.8 mg/dL — ABNORMAL LOW (ref 8.9–10.3)
Chloride: 91 mmol/L — ABNORMAL LOW (ref 98–111)
Chloride: 91 mmol/L — ABNORMAL LOW (ref 98–111)
Creatinine, Ser: 2.97 mg/dL — ABNORMAL HIGH (ref 0.44–1.00)
Creatinine, Ser: 2.84 mg/dL — ABNORMAL HIGH (ref 0.44–1.00)
GFR, Estimated: 18 mL/min — ABNORMAL LOW (ref >60)
GFR, Estimated: 19 mL/min — ABNORMAL LOW (ref >60)
Glucose, Bld: 179 mg/dL — ABNORMAL HIGH (ref 70–99)
Glucose, Bld: 159 mg/dL — ABNORMAL HIGH (ref 70–99)
Potassium: 4.5 mmol/L (ref 3.5–5.1)
Potassium: 3.8 mmol/L (ref 3.5–5.1)
Sodium: 130 mmol/L — ABNORMAL LOW (ref 135–145)
Sodium: 130 mmol/L — ABNORMAL LOW (ref 135–145)

## 2023-09-30 LAB — SYNOVIAL CELL COUNT + DIFF, W/ CRYSTALS
Eosinophils-Synovial: 0 %
Lymphocytes-Synovial Fld: 0 %
Monocyte-Macrophage-Synovial Fluid: 1 %
Neutrophil, Synovial: 99 %
WBC, Synovial: 61671 /mm3 — ABNORMAL HIGH (ref 0–200)

## 2023-09-30 LAB — PHOSPHORUS: Phosphorus: 5.4 mg/dL — ABNORMAL HIGH (ref 2.5–4.6)

## 2023-09-30 LAB — CBC
HCT: 41.8 % (ref 36.0–46.0)
Hemoglobin: 13.1 g/dL (ref 12.0–15.0)
MCH: 28.2 pg (ref 26.0–34.0)
MCHC: 31.3 g/dL (ref 30.0–36.0)
MCV: 89.9 fL (ref 80.0–100.0)
Platelets: 305 10*3/uL (ref 150–400)
RBC: 4.65 MIL/uL (ref 3.87–5.11)
RDW: 15.9 % — ABNORMAL HIGH (ref 11.5–15.5)
WBC: 9 10*3/uL (ref 4.0–10.5)
nRBC: 0 % (ref 0.0–0.2)

## 2023-09-30 LAB — MAGNESIUM: Magnesium: 2.8 mg/dL — ABNORMAL HIGH (ref 1.7–2.4)

## 2023-09-30 MED ORDER — SODIUM CHLORIDE 0.9 % IV SOLN: INTRAVENOUS | Status: DC

## 2023-09-30 MED ORDER — CARVEDILOL 6.25 MG PO TABS: 3.1250 mg | ORAL_TABLET | Freq: Two times a day (BID) | ORAL | Status: DC

## 2023-09-30 NOTE — Plan of Care (Signed)
   Problem: Education: Goal: Knowledge of General Education information will improve Description Including pain rating scale, medication(s)/side effects and non-pharmacologic comfort measures Outcome: Progressing   Problem: Health Behavior/Discharge Planning: Goal: Ability to manage health-related needs will improve Outcome: Progressing

## 2023-09-30 NOTE — Plan of Care (Signed)

## 2023-09-30 NOTE — Progress Notes (Signed)
 [facility] Progress Note  Patient: Angelica Herrera    UJW:[id number]  DOA: 09/28/2023     Date of Service: the patient was seen and examined on 09/30/2023  Chief Complaint  Patient presents with   Generalized Body Aches   Brief hospital course: Angelica Herrera is a 53 y.o. female with medical history significant of CAD stenting, chronic HFrEF with LVEF 35-40%, renal artery stenosis status post renal artery stenting, CKD stage IIIb, HTN, GERD, anxiety/depression, chronic peripheral neuropathy, presented with worsening of dysuria right flank pain nauseous vomiting.   Symptoms started 5 days ago, patient started to have dysuria followed by new onset of right-sided flank pain radiating to right groin area, sharp-like, episode of subjective fever and chills.  Also started to have significant nausea with vomiting of stomach content and decreased p.o. intake due to nausea.  No diarrhea denies any chest pain or shortness of breath or cough. ED Course: Afebrile, blood pressure 140/80 O2 saturation 95% room air.  CT abdomen pelvis negative for acute findings, UA showed 3+ WBC 1+ RBC, WBC 10.6 hemoglobin 16 BUN 26 creatinine 1.8 compared to baseline 1.2-2.0, lactic acid 2.7> 1.8, K2.4 bicarb 29.   Patient was given IV bolus, ceftriaxone , IV and p.o. KCl.   Assessment and Plan:  # Acute UTI CT a/p: No acute intra-abdominal or intrapelvic abnormality. 2. Partially visualized at least trace to small volume pericardial effusion. - Continue ceftriaxone  - Urine cultures pending  # AKI, unknown cause Bladder scan ruled out urinary retention Decreased urine output, started gentle hydration Check urine sodium and creatinine, to check FeNa Follow renal sonogram Titrate urine output and renal functions daily   # Acute hypoxic respiratory failure due to acute pulmonary edema could be due to IV fluid given due to sepsis CXR: Cardiomegaly with central pulmonary vascular congestion and  streaky right basilar opacities, which could reflect asymmetric edema or atelectasis. Continue supplemental O2 inhalation Provided IV Lasix  due to renal failure BNP 245 slightly elevated Follow 2D echocardiogram  # SIRS Elevated lactic acid S/p IVF bolus.  Blood culture NGTD  # Severe hypokalemia, resolved - IV and p.o. replacement - Magnesium  and phosphorus levels wnl    # Hypotension Started midodrine 10 mg p.o. 3 times daily with holding parameters Monitor BP and titrate medications accordingly  # Chronic HFrEF - Volume contracted Discontinued Coreg  due to low blood pressure and interaction with colchicine  - Hold off Entresto  and torsemide     # CKD stage IIIb - Volume contracted, creatinine level stable - Hold off diuresis Cr 1.84   # History of CAD - No acute concern - Continue Aspirin , Plavix  and Atorvastatin    # Chronic peripheral neuropathy - No acute concern, continue gabapentin   # Acute gout flare of left knee joint  Uric acid 14.4 elevated S/p Solu-Medrol  40 mg IV twice daily followed by oral prednisone  Started colchicine  0.6 mg x 1 dose followed by 0.3 mg p.o. daily renal dose Consulted orthopedic surgery, s/p Arthrocentesis, MSU crystals positive, WBC 61K elevated.  Most likely gout.  No surgical intervention at this time Follow fluid culture  # Adrenal nodule, incidental finding Multiple lesions, including left adrenal mass measuring 1.6 cm, probable benign adenoma.Recommend follow-up adrenal washout CT in 1 year.  Recommended to follow-up with PCP   Body mass index is 25.21 kg/m.  Interventions:   Diet: Heart healthy diet DVT Prophylaxis: Subcutaneous Heparin     Advance goals of care discussion: Full code  Family Communication: family was not present  at bedside, at the time of interview.  The pt provided permission to discuss medical plan with the family. Opportunity was given to ask question and all questions were answered satisfactorily.    Disposition:  Pt is from Home, admitted with UTI and Left knee Gout, still has pian and on IV Abx, which precludes a safe discharge. Discharge to Home, when stable may need few days to improve.  Subjective: No significant events overnight, left knee pain is improving, back pain is well-controlled.  Making less urine, patient denied any other complaints.   Physical Exam: General: NAD, lying comfortably Appear in no distress, affect appropriate Eyes: PERRLA ENT: Oral Mucosa Clear, moist  Neck: no JVD,  Cardiovascular: S1 and S2 Present, no Murmur,  Respiratory: good respiratory effort, Bilateral Air entry equal and Decreased, no Crackles, no wheezes Abdomen: Bowel Sound present, Soft and no tenderness,  Skin: no rashes Extremities: Left knee tender and swollen improved., no significant erythema. S/p arthrocentesis Neurologic: without any new focal findings Gait not checked due to patient safety concerns  Vitals:   09/30/23 0419 09/30/23 0812 09/30/23 1145 09/30/23 1533  BP: 123/88 129/74 108/78 113/64  Pulse: 80 68 68 71  Resp: 18 16  16   Temp: 97.8 F (36.6 C) 98.4 F (36.9 C)  98.5 F (36.9 C)  TempSrc: Oral     SpO2: (!) 89% 90%  93%  Weight:      Height:        Intake/Output Summary (Last 24 hours) at 09/30/2023 1600 Last data filed at 09/30/2023 1457 Gross per 24 hour  Intake 497.08 ml  Output --  Net 497.08 ml   Filed Weights   09/28/23 1112  Weight: 73 kg    Data Reviewed: I have personally reviewed and interpreted daily labs, tele strips, imagings as discussed above. I reviewed all nursing notes, pharmacy notes, vitals, pertinent old records I have discussed plan of care as described above with RN and patient/family.  CBC: Recent Labs  Lab 09/28/23 1142 09/30/23 0352  WBC 10.4 9.0  NEUTROABS 8.7*  --   HGB 16.1* 13.1  HCT 50.8* 41.8  MCV 87.6 89.9  PLT 362 305   Basic Metabolic Panel: Recent Labs  Lab 09/28/23 1142 09/28/23 1336  09/28/23 2111 09/29/23 0359 09/30/23 0352 09/30/23 1110  NA 136  --  138 137 130* 130*  K 2.4*  --  3.2* 3.6 4.5 3.8  CL 89*  --  96* 98 91* 91*  CO2 29  --  27 27 25 25   GLUCOSE 124*  --  109* 104* 179* 159*  BUN 26*  --  29* 33* 49* 50*  CREATININE 1.83*  --  1.94* 1.84* 2.97* 2.84*  CALCIUM  10.0  --  9.0 8.8* 8.8* 8.8*  MG  --  1.9  --   --  2.8*  --   PHOS  --  2.8  --   --  5.4*  --     Studies: No results found.  Scheduled Meds:  aspirin  EC  81 mg Oral Daily   atorvastatin   80 mg Oral QHS   clopidogrel   75 mg Oral Daily   colchicine   0.3 mg Oral Daily   docusate sodium  100 mg Oral Daily   fluticasone  furoate-vilanterol  1 puff Inhalation Daily   gabapentin   300 mg Oral BID   heparin   5,000 Units Subcutaneous Q12H   levothyroxine   125 mcg Oral Q0600   methylPREDNISolone  (SOLU-MEDROL ) injection  40 mg Intravenous  Daily   Followed by   Cecily Cohen ON 10/02/2023] predniSONE   40 mg Oral Q breakfast   midodrine  10 mg Oral TID WC   PARoxetine   40 mg Oral Daily   Continuous Infusions:  cefTRIAXone  (ROCEPHIN )  IV 2 g (09/30/23 1050)   PRN Meds: acetaminophen , ALPRAZolam , hydrALAZINE , HYDROmorphone  (DILAUDID ) injection, ipratropium-albuterol , ondansetron  (ZOFRAN ) IV, oxyCODONE , polyethylene glycol  Time spent: 40 minutes  Author: Althia Atlas. MD [facility] 09/30/2023 4:00 PM  To reach On-call, see care teams to locate the attending and reach out to them via [email or web address]. If 7PM-7AM, please contact night-coverage If you still have difficulty reaching the attending provider, please page the [facility] (Director on Call) for [facility] on amion for assistance.

## 2023-09-30 NOTE — Consult Note (Signed)
 ORTHOPAEDIC CONSULTATION  REQUESTING PHYSICIAN: Althia Atlas, MD  ASSESSMENT AND PLAN: 53 y.o. female with the following: Left knee effusion; septic arthritis versus gout  Orthopedics recommends admission to a medical service and we will provide consultation and follow along  - Weight Bearing Status/Activity: Weightbearing as tolerated  - Additional recommended labs/tests: None  -VTE Prophylaxis: Hold until aspiration results are available.  - Pain control: As needed  - Follow-up plan: To be determined  -Procedures: Left knee aspiration; possible surgery  Procedure note - aspiration left knee joint   Verbal consent was obtained to aspirate the left knee joint  Timeout was completed to confirm the site of aspiration. The skin was prepped with alcohol and ethyl chloride was sprayed at the aspiration site.  An 18-gauge needle was inserted and 50 cc of cloudy joint fluid was aspirated using a superolateral approach.  There were no complications. A sterile bandage was applied.  Fluid was sent for cell count, crystal analysis as well as culture  Chief Complaint: Left knee pain  HPI: Angelica Herrera is a 53 y.o. female with past medical history as listed below, who has been admitted for a kidney infection.  She states that she started to notice swelling in the left knee for the past 5-6 days.  No specific injury.  She does have a history of gout.  She has never had a flare in her left knee.  She is unable to take her colchicine  on a regular basis.  She does have an extensive heart history, and is currently taking Plavix .  She has limited range of motion.  She denies fevers or chills.  She states that her left knee has been red and swollen.  Past Medical History:  Diagnosis Date   Accelerated hypertension 11/13/2018   Allergic rhinitis due to pollen 06/28/2015   Anxiety with depression 02/18/2020   Arthritis    gout   Chest pain 11/12/2018   Chronic combined systolic  (congestive) and diastolic (congestive) heart failure (HCC)    a. 02/2017 Echo: EF 30-35%, diff HK, sev HK/poss AK of ant and lat walls. Gr1 DD. Mild to mod MR. Nl RV fxn. Mod TR.   Chronic obstructive pulmonary disease (HCC) 09/02/2019   tx with trelegy and SABA   Chronic systolic heart failure (HCC) 11/21/2017   CKD (chronic kidney disease), stage IV (HCC)    Controlled substance agreement signed 09/07/2015   Coronary artery disease involving native coronary artery    Degenerative disc disease, cervical 03/17/2015   Depression, recurrent (HCC) 12/02/2017   Essential hypertension, malignant 02/12/2012   GAD (generalized anxiety disorder) 03/17/2015   History of acute anterior wall MI 02/12/2012   Hyperlipidemia    Hypothyroidism    Ischemic cardiomyopathy    a. 02/2012 Echo: EF 40-45%, ant/lat HK, Gr1 DD, Mild MR, PASP ; b. 02/2017 Echo: EF 30-35%.   NSVT (nonsustained ventricular tachycardia) (HCC)    a. 02/2012 post-MI   Obesity    Pancreatitis    Rhinosinusitis 09/02/2019   S/P primary angioplasty with coronary stent 03/04/2018   Dual antiplatelet therapy INDEFINITELY per cardiology note Nov 2019   Squamous cell carcinoma of back 04/20/2019   referrred to sugery for f/up excision, derm not available for several month, lesion with rapid change cannot wait, still see derm for skin survey etc   Stage 3b chronic kidney disease (HCC) 02/18/2020   Tobacco abuse    Tobacco dependence 02/18/2020   Past Surgical History:  Procedure Laterality Date  ABDOMINAL HYSTERECTOMY     BIOPSY  10/30/2022   Procedure: BIOPSY;  Surgeon: Luke Salaam, MD;  Location: [facility] ENDOSCOPY;  Service: Gastroenterology;;   CARDIAC CATHETERIZATION  2013   [facility] s/p stent   CARDIAC CATHETERIZATION Left 08/03/2015   Procedure: Left Heart Cath and Coronary Angiography;  Surgeon: Wenona Hamilton, MD;  Location: [facility] INVASIVE CV LAB;  Service: Cardiovascular;  Laterality: Left;   COLONOSCOPY WITH PROPOFOL  N/A 10/30/2022    Procedure: COLONOSCOPY WITH PROPOFOL ;  Surgeon: Luke Salaam, MD;  Location: [facility] ENDOSCOPY;  Service: Gastroenterology;  Laterality: N/A;   DILATION AND CURETTAGE OF UTERUS     ESOPHAGOGASTRODUODENOSCOPY (EGD) WITH PROPOFOL  N/A 10/30/2022   Procedure: ESOPHAGOGASTRODUODENOSCOPY (EGD) WITH PROPOFOL ;  Surgeon: Luke Salaam, MD;  Location: [facility] ENDOSCOPY;  Service: Gastroenterology;  Laterality: N/A;   LEFT HEART CATHETERIZATION WITH CORONARY ANGIOGRAM N/A 02/11/2012   Procedure: LEFT HEART CATHETERIZATION WITH CORONARY ANGIOGRAM;  Surgeon: Arnoldo Lapping, MD;  Location: [facility] CATH LAB;  Service: Cardiovascular;  Laterality: N/A;   POLYPECTOMY  10/30/2022   Procedure: POLYPECTOMY;  Surgeon: Luke Salaam, MD;  Location: [facility] ENDOSCOPY;  Service: Gastroenterology;;   RENAL INTERVENTION N/A 11/17/2018   Procedure: RENAL INTERVENTION;  Surgeon: Jackquelyn Mass, MD;  Location: [facility] INVASIVE CV LAB;  Service: Cardiovascular;  Laterality: N/A;   Social History   Socioeconomic History   Marital status: Legally Separated    Spouse name: Not on file   Number of children: Not on file   Years of education: Not on file   Highest education level: Not on file  Occupational History   Not on file  Tobacco Use   Smoking status: Every Day    Current packs/day: 0.25    Average packs/day: 0.3 packs/day for 30.0 years (7.5 ttl pk-yrs)    Types: Cigarettes   Smokeless tobacco: Never  Vaping Use   Vaping status: Never Used  Substance and Sexual Activity   Alcohol use: Not Currently    Comment: Says she previously drank socially - never a heavy drinker.  No etoh since 08/2018.   Drug use: No    Types: Marijuana   Sexual activity: Not on file  Other Topics Concern   Not on file  Social History Narrative   Lives in [place] w/ her dtr.  Does not routinely exercise.  Disabled/doesn't work.   Social Drivers of Corporate investment banker Strain: Not on file  Food Insecurity: No Food Insecurity (09/30/2023)    Hunger Vital Sign    Worried About Running Out of Food in the Last Year: Never true    Ran Out of Food in the Last Year: Never true  Transportation Needs: No Transportation Needs (09/30/2023)   PRAPARE - Administrator, Civil Service (Medical): No    Lack of Transportation (Non-Medical): No  Physical Activity: Not on file  Stress: Not on file  Social Connections: Not on file   Family History  Problem Relation Age of Onset   Hypertension Father    Thyroid  disease Father    AAA (abdominal aortic aneurysm) Father    Hypertension Mother    Hyperlipidemia Mother    Diabetes Mother    Kidney disease Mother    Hypertension Sister    Hypertension Sister    Supraventricular tachycardia Sister    Hypertension Brother    Hypertension Brother    Hypertension Daughter    Cancer Paternal Grandfather        lung   No Known Allergies Prior to  Admission medications   Medication Sig Start Date End Date Taking? Authorizing Provider  acetaminophen  (TYLENOL ) 500 MG tablet Take 1,000 mg by mouth every 6 (six) hours as needed.   Yes [provider]  aspirin  81 MG tablet Take 1 tablet (81 mg total) by mouth daily. 02/15/12  Yes Florette Hurry, NP  atorvastatin  (LIPITOR ) 80 MG tablet Take 1 tablet (80 mg total) by mouth at bedtime. 03/20/23  Yes Mecum, Erin E, PA-C  busPIRone  (BUSPAR ) 5 MG tablet TAKE 1 TO 3 TABLETS BY MOUTH TWICE DAILY AS NEEDED 02/18/23  Yes Tapia, Leisa, PA-C  carvedilol  (COREG ) 12.5 MG tablet TAKE ONE (1) TABLET (12.5 MG TOTAL) BY MOUTH TWO (2) (TWO) TIMES DAILY WITH A MEAL. 07/31/23  Yes Dunn, Elvia Hammans, PA-C  cetirizine  (ZYRTEC ) 10 MG tablet TAKE (1) TABLET BY MOUTH  AT BEDTIME 11/15/22  Yes Tapia, Leisa, PA-C  clopidogrel  (PLAVIX ) 75 MG tablet TAKE (1) TABLET BY MOUTH EVERY DAY 03/20/23  Yes Mecum, Erin E, PA-C  colchicine  0.6 MG tablet Take 2 tabs for initial dose, then take 1 tab daily until pain subsides 07/04/23  Yes Pender, Julie F, FNP  gabapentin   (NEURONTIN ) 300 MG capsule TAKE (1) CAPSULE BY MOUTH TWICE DAILY 09/16/23  Yes Tapia, Leisa, PA-C  levothyroxine  (SYNTHROID ) 125 MCG tablet TAKE ONE (1) TABLET BY MOUTH DAILY BEFORE BREAKFAST. 06/18/23  Yes Tapia, Leisa, PA-C  nitroGLYCERIN  (NITROSTAT ) 0.4 MG SL tablet Place 1 tablet (0.4 mg total) under the tongue every 5 (five) minutes x 3 doses as needed for chest pain. 08/25/19  Yes Clearnce Curia, NP  PARoxetine  (PAXIL ) 40 MG tablet TAKE (1) TABLET BY MOUTH EVERY DAY 09/13/23  Yes Tapia, Leisa, PA-C  sacubitril -valsartan  (ENTRESTO ) 97-103 MG TAKE ONE (1) TABLET BY MOUTH TWO (2) (TWO) TIMES DAILY. 09/11/23  Yes Wenona Hamilton, MD  torsemide  (DEMADEX ) 20 MG tablet Take 20 mg by mouth daily. 12/17/22  Yes [provider]   DG Chest Port 1 View Result Date: 09/29/2023 CLINICAL DATA:  Shortness of breath. EXAM: PORTABLE CHEST 1 VIEW COMPARISON:  12/25/2022. FINDINGS: Cardiomegaly, unchanged. Similar scarring/atelectasis in the left mid lung. Central pulmonary vascular congestion with streaky right basilar opacities, which could reflect edema or atelectasis. No sizeable pleural effusion or pneumothorax. No acute osseous abnormality. IMPRESSION: Cardiomegaly with central pulmonary vascular congestion and streaky right basilar opacities, which could reflect asymmetric edema or atelectasis. Electronically Signed   By: Mannie Seek M.D.   On: 09/29/2023 16:17   CT ABDOMEN PELVIS W CONTRAST Result Date: 09/28/2023 CLINICAL DATA:  RLQ, R flank pain. +dysuria. Eval for appendicitis, urolithiasis, pyelonephritis EXAM: CT ABDOMEN AND PELVIS WITH CONTRAST TECHNIQUE: Multidetector CT imaging of the abdomen and pelvis was performed using the standard protocol following bolus administration of intravenous contrast. RADIATION DOSE REDUCTION: This exam was performed according to the departmental dose-optimization program which includes automated exposure control, adjustment of the mA and/or kV according to  patient size and/or use of iterative reconstruction technique. CONTRAST:  80mL OMNIPAQUE  IOHEXOL  300 MG/ML  SOLN COMPARISON:  CT abdomen pelvis 12/24/2022 FINDINGS: Lower chest: At least trace to small volume pericardial effusion partially visualized. Hepatobiliary: Liver limb mildly enlarged measuring up to 18.5 cm. No focal liver abnormality. No gallstones, gallbladder wall thickening, or pericholecystic fluid. No biliary dilatation. Pancreas: No focal lesion. Normal pancreatic contour. No surrounding inflammatory changes. No main pancreatic ductal dilatation. Spleen: Normal in size without focal abnormality. Adrenals/Urinary Tract: Bilateral adrenal gland nodules measuring 1 cm on the  right with density of 45 Hounsfield units and 1.6 cm on the left with density of 38 Hounsfield units. Bilateral kidneys enhance symmetrically. No hydronephrosis. No hydroureter.  No nephroureterolithiasis. The urinary bladder is unremarkable. Stomach/Bowel: Stomach is within normal limits. No evidence of bowel wall thickening or dilatation. Appendix appears normal. Vascular/Lymphatic: No abdominal aorta or iliac aneurysm. Severe atherosclerotic plaque of the aorta and its branches. No abdominal, pelvic, or inguinal lymphadenopathy. Reproductive: There is a 2 cm x 1.5 cm fluid density lesion along the anterior wall of the vagina head is suggestive of a Gardner cyst. Status post hysterectomy. No adnexal masses. Other: No intraperitoneal free fluid. No intraperitoneal free gas. No organized fluid collection. Musculoskeletal: No abdominal wall hernia or abnormality. No suspicious lytic or blastic osseous lesions. No acute displaced fracture. L5-S1 intervertebral disc space vacuum phenomenon and endplate sclerosis. IMPRESSION: 1. No acute intra-abdominal or intrapelvic abnormality. 2. Partially visualized at least trace to small volume pericardial effusion. 3. Incidental 2 cm Gartner cyst. 4. Multiple lesions, including left adrenal mass  measuring 1.6 cm, probable benign adenoma. 5. Recommend follow-up adrenal washout CT in 1 year. If stable for ? 1 year, no further follow-up imaging. JACR 2017 Aug; 14(8):1038-44, JCAT 2016 Mar-Apr; 40(2):194-200, Urol J 2006 Spring; 3(2):71-4. 6.  Aortic Atherosclerosis (ICD10-I70.0). Electronically Signed   By: Morgane  Naveau M.D.   On: 09/28/2023 14:20   DG Knee Complete 4 Views Left Result Date: 09/28/2023 CLINICAL DATA:  L knee pain/swelling EXAM: LEFT KNEE - COMPLETE 4+ VIEW COMPARISON:  May 16, 2022 FINDINGS: No acute fracture or dislocation. Joint spaces and alignment are maintained. No area of erosion or osseous destruction. No unexpected radiopaque foreign body. Large joint effusion. IMPRESSION: Large joint effusion. No acute fracture or dislocation. Electronically Signed   By: Clancy Crimes M.D.   On: 09/28/2023 11:55   Family History Reviewed and non-contributory, no pertinent history of problems with bleeding or anesthesia    Review of Systems No fevers or chills No numbness or tingling No chest pain No shortness of breath No bowel or bladder dysfunction No GI distress No headaches    OBJECTIVE  Vitals:Patient Vitals for the past 8 hrs:  BP Temp Temp src Pulse Resp SpO2  09/30/23 0419 123/88 97.8 F (36.6 C) Oral 80 18 (!) 89 %   General: Alert, no acute distress Cardiovascular: Warm extremities noted Respiratory: No cyanosis, no use of accessory musculature GI: No organomegaly, abdomen is soft and non-tender Skin: No lesions in the area of chief complaint other than those listed below in MSK exam.  Neurologic: Sensation intact distally save for the below mentioned MSK exam Psychiatric: Patient is competent for consent with normal mood and affect Lymphatic: No swelling obvious and reported other than the area involved in the exam below Extremities   LLE: Evaluation of the left knee demonstrates a significant effusion.  There is a lot of swelling in the  knee.  No redness.  No warmth is appreciated.  Very limited range of motion.  She tolerates active motion from 10-40 degrees, before becomes too painful.  Patella is ballotable.    Test Results Imaging X-rays the left knee shows a large effusion.  No acute injuries.  Labs cbc Recent Labs    09/28/23 1142 09/30/23 0352  WBC 10.4 9.0  HGB 16.1* 13.1  HCT 50.8* 41.8  PLT 362 305      Recent Labs    09/29/23 0359 09/30/23 0352  NA 137 130*  K 3.6 4.5  CL 98 91*  CO2 27 25  GLUCOSE 104* 179*  BUN 33* 49*  CREATININE 1.84* 2.97*  CALCIUM  8.8* 8.8*

## 2023-10-01 ENCOUNTER — Other Ambulatory Visit: Payer: Self-pay | Admitting: Physician Assistant

## 2023-10-01 DIAGNOSIS — M109 Gout, unspecified: Secondary | ICD-10-CM | POA: Diagnosis not present

## 2023-10-01 DIAGNOSIS — A419 Sepsis, unspecified organism: Secondary | ICD-10-CM | POA: Diagnosis not present

## 2023-10-01 DIAGNOSIS — I2510 Atherosclerotic heart disease of native coronary artery without angina pectoris: Secondary | ICD-10-CM

## 2023-10-01 LAB — BASIC METABOLIC PANEL WITH GFR
Anion gap: 13 (ref 5–15)
BUN: 60 mg/dL — ABNORMAL HIGH (ref 6–20)
CO2: 26 mmol/L (ref 22–32)
Calcium: 8.4 mg/dL — ABNORMAL LOW (ref 8.9–10.3)
Chloride: 89 mmol/L — ABNORMAL LOW (ref 98–111)
Creatinine, Ser: 2.66 mg/dL — ABNORMAL HIGH (ref 0.44–1.00)
GFR, Estimated: 21 mL/min — ABNORMAL LOW
Glucose, Bld: 151 mg/dL — ABNORMAL HIGH (ref 70–99)
Potassium: 4.1 mmol/L (ref 3.5–5.1)
Sodium: 128 mmol/L — ABNORMAL LOW (ref 135–145)

## 2023-10-01 LAB — CBC
HCT: 35 % — ABNORMAL LOW (ref 36.0–46.0)
Hemoglobin: 11 g/dL — ABNORMAL LOW (ref 12.0–15.0)
MCH: 28 pg (ref 26.0–34.0)
MCHC: 31.4 g/dL (ref 30.0–36.0)
MCV: 89.1 fL (ref 80.0–100.0)
Platelets: 333 10*3/uL (ref 150–400)
RBC: 3.93 MIL/uL (ref 3.87–5.11)
RDW: 15.8 % — ABNORMAL HIGH (ref 11.5–15.5)
WBC: 12.7 10*3/uL — ABNORMAL HIGH (ref 4.0–10.5)
nRBC: 0 % (ref 0.0–0.2)

## 2023-10-01 LAB — PHOSPHORUS: Phosphorus: 4.9 mg/dL — ABNORMAL HIGH (ref 2.5–4.6)

## 2023-10-01 LAB — CREATININE, URINE, RANDOM: Creatinine, Urine: 81 mg/dL

## 2023-10-01 LAB — SODIUM, URINE, RANDOM: Sodium, Ur: 24 mmol/L

## 2023-10-01 LAB — MAGNESIUM: Magnesium: 2.5 mg/dL — ABNORMAL HIGH (ref 1.7–2.4)

## 2023-10-01 MED ORDER — SODIUM CHLORIDE 0.9 % IV SOLN: INTRAVENOUS | Status: DC

## 2023-10-01 NOTE — Progress Notes (Signed)
   ORTHOPAEDIC PROGRESS NOTE  s/p right knee arthrocentesis; consistent with gout, monitor culture  SUBJECTIVE: Knee with some improvement since arthrocentesis.  She notes more motion.  Less pain.  No fevers or chills.   OBJECTIVE: PE:  Alert and oriented, no acute distress  Right knee effusion No redness ROM from 5-85, tolerating more motion Toes are warm and well perfused.  Aspiration site without erythema or drainage  Vitals:   10/01/23 1508 10/01/23 2001  BP: (!) 150/83 (!) 163/96  Pulse: 76 64  Resp: 18 16  Temp:  98 F (36.7 C)  SpO2: 96% 95%       Latest Ref Rng & Units 10/01/2023    4:20 AM 09/30/2023    3:52 AM 09/28/2023   11:42 AM  CBC  WBC 4.0 - 10.5 K/uL 12.7  9.0  10.4   Hemoglobin 12.0 - 15.0 g/dL 40.9  81.1  91.4   Hematocrit 36.0 - 46.0 % 35.0  41.8  50.8   Platelets 150 - 400 K/uL 333  305  362       ASSESSMENT: Angelica Herrera is a 53 y.o. female stable with persistent knee effusion, improving pain  PLAN: Weightbearing: WBAT RLE Incisional and dressing care: No dressing needed Orthopedic device(s): None VTE prophylaxis: Ok to continue plavix  and heparin  at this time Pain control: As needed Follow - up plan: TBD  Monitor cultures - NGTD 61,000 WBCs, + for gout Inflammation response secondary to gout If does not improve with steroids and colchicine , can consider I&D If culture positive, will proceed to OR   Contact information:     Jethro Radke A. Ernesta Heading, MD MS [facility] [address] [place],  [place]  [address] Phone: [phone] Fax: [phone]

## 2023-10-01 NOTE — TOC Initial Note (Signed)
 Transition of Care [facility]) - Initial/Assessment Note    Patient Details  Name: Angelica Herrera MRN: [id number] Date of Birth: Aug 31, 1970  Transition of Care [facility]) CM/SW Contact:    Odilia Bennett, LCSW Phone Number: 10/01/2023, 11:38 AM  Clinical Narrative:  Readmission prevention screen complete. CSW met with patient. No family at bedside. CSW introduced role and explained that discharge planning would be discussed. PCP is Adeline Hone, PA-C. Her daughters drive her to appointments. Pharmacy is [facility] in [place]. No issues obtaining medications. Patient lives home alone. She's working on getting a one-bedroom apartment because the two-bedroom has stairs. No home health or DME use prior to admission. She is currently on acute oxygen. Will follow for this potential discharge need. No further concerns. CSW will continue to follow patient for support and facilitate return home once stable. Her daughters or her ex-husband will transport her home at discharge.                Expected Discharge Plan: Home/Self Care Barriers to Discharge: Continued Medical Work up   Patient Goals and CMS Choice            Expected Discharge Plan and Services     Post Acute Care Choice: NA Living arrangements for the past 2 months: Apartment                                      Prior Living Arrangements/Services Living arrangements for the past 2 months: Apartment Lives with:: Self Patient language and need for interpreter reviewed:: Yes Do you feel safe going back to the place where you live?: Yes      Need for Family Participation in Patient Care: Yes (Comment)     Criminal Activity/Legal Involvement Pertinent to Current Situation/Hospitalization: No - Comment as needed  Activities of Daily Living      Permission Sought/Granted                  Emotional Assessment Appearance:: Appears stated age Attitude/Demeanor/Rapport: Engaged, Gracious Affect (typically observed):  Accepting, Appropriate, Calm, Pleasant Orientation: : Oriented to Self, Oriented to Place, Oriented to  Time, Oriented to Situation Alcohol / Substance Use: Not Applicable Psych Involvement: No (comment)  Admission diagnosis:  Hypokalemia [E87.6] Pyelonephritis [N12] COPD with acute exacerbation (HCC) [J44.1] Sepsis (HCC) [A41.9] Nausea and vomiting, unspecified vomiting type [R11.2] Patient Active Problem List   Diagnosis Date Noted   Sepsis (HCC) 09/28/2023   Acute pyelonephritis 09/28/2023   Acute gout of ankle 02/17/2023   Anemia in chronic kidney disease 01/16/2023   Benign hypertensive kidney disease with chronic kidney disease 01/16/2023   Hyperkalemia 01/16/2023   Acute kidney failure (HCC) 01/16/2023   Chronic kidney disease, stage 3b (HCC) 01/16/2023   Demand ischemia (HCC) 12/25/2022   Elevated troponin 12/25/2022   NSTEMI (non-ST elevated myocardial infarction) (HCC) 12/24/2022   Acute renal failure with acute tubular necrosis superimposed on stage 3b chronic kidney disease (HCC) 12/24/2022   Family history of colon cancer 10/30/2022   Adenomatous polyp of colon 10/30/2022   Generalized abdominal pain 10/30/2022   Elevated hemoglobin (HCC) 05/28/2022   History of pancreatitis 05/28/2022   Lack of access to transportation 12/27/2021   Arthritis 09/04/2021   Chronic combined systolic (congestive) and diastolic (congestive) heart failure (HCC) 09/04/2021   Alcoholic pancreatitis 09/04/2021   Stage 3b chronic kidney disease (HCC) 02/18/2020   Anxiety with depression  02/18/2020   Tobacco dependence 02/18/2020   Rhinosinusitis 09/02/2019   COPD with acute exacerbation (HCC) 09/02/2019   Squamous cell carcinoma of back 04/20/2019   Accelerated hypertension 11/13/2018   Chest pain 11/12/2018   S/P primary angioplasty with coronary stent 03/04/2018   Depression, recurrent (HCC) 12/02/2017   Chronic systolic heart failure (HCC) 11/21/2017   Controlled substance agreement  signed 09/07/2015   Coronary artery disease involving native coronary artery    Allergic rhinitis due to pollen 06/28/2015   GAD (generalized anxiety disorder) 03/17/2015   Degenerative disc disease, cervical 03/17/2015   Hyperlipidemia    Obesity    CKD (chronic kidney disease), stage IV (HCC)    Ischemic cardiomyopathy    Hypothyroidism    History of acute anterior wall MI 02/12/2012   Essential hypertension, malignant 02/12/2012   Tobacco abuse 02/12/2012   PCP:  Adeline Hone, PA-C Pharmacy:   [facility] - [place], [place] - [address] [place] [address] Phone: [phone] Fax: [phone]     Social Drivers of Health (SDOH) Social History: SDOH Screenings   Food Insecurity: No Food Insecurity (09/30/2023)  Housing: Low Risk  (09/30/2023)  Transportation Needs: No Transportation Needs (09/30/2023)  Utilities: Not At Risk (09/30/2023)  Alcohol Screen: Low Risk  (01/31/2021)  Depression (PHQ2-9): Low Risk  (03/20/2023)  Tobacco Use: High Risk (09/28/2023)   SDOH Interventions:     Readmission Risk Interventions    10/01/2023   11:34 AM  Readmission Risk Prevention Plan  Transportation Screening Complete  PCP or Specialist Appt within 3-5 Days Complete  Social Work Consult for Recovery Care Planning/Counseling Complete  Palliative Care Screening Not Applicable  Medication Review Oceanographer) Complete

## 2023-10-01 NOTE — Plan of Care (Signed)

## 2023-10-01 NOTE — Progress Notes (Signed)
 [facility] Progress Note  Patient: Angelica Herrera    ZOX:[id number]  DOA: 09/28/2023     Date of Service: the patient was seen and examined on 10/01/2023  Chief Complaint  Patient presents with   Generalized Body Aches   Brief hospital course: Angelica Herrera is a 53 y.o. female with medical history significant of CAD stenting, chronic HFrEF with LVEF 35-40%, renal artery stenosis status post renal artery stenting, CKD stage IIIb, HTN, GERD, anxiety/depression, chronic peripheral neuropathy, presented with worsening of dysuria right flank pain nauseous vomiting.   Symptoms started 5 days ago, patient started to have dysuria followed by new onset of right-sided flank pain radiating to right groin area, sharp-like, episode of subjective fever and chills.  Also started to have significant nausea with vomiting of stomach content and decreased p.o. intake due to nausea.  No diarrhea denies any chest pain or shortness of breath or cough. ED Course: Afebrile, blood pressure 140/80 O2 saturation 95% room air.  CT abdomen pelvis negative for acute findings, UA showed 3+ WBC 1+ RBC, WBC 10.6 hemoglobin 16 BUN 26 creatinine 1.8 compared to baseline 1.2-2.0, lactic acid 2.7> 1.8, K2.4 bicarb 29.   Patient was given IV bolus, ceftriaxone , IV and p.o. KCl.   Assessment and Plan:  # Acute UTI CT a/p: No acute intra-abdominal or intrapelvic abnormality. 2. Partially visualized at least trace to small volume pericardial effusion. - Continue ceftriaxone  - Urine cultures was ordered but it was canceled, did not know the reason.   # AKI, FeNA 0.6 pre-renal  # CKD stage IIIb - Hold off diuresis Bladder scan ruled out urinary retention Decreased urine output, s/p gentle hydration US  renal: No collecting system dilatation.  6/18 start IV fluid for hydration Watch for signs of volume overload and stop fluid if patient becomes short of breath Cr 1.84 -- 2.66  # Acute hypoxic respiratory  failure due to acute pulmonary edema could be due to IV fluid given due to sepsis CXR: Cardiomegaly with central pulmonary vascular congestion and streaky right basilar opacities, which could reflect asymmetric edema or atelectasis. Continue supplemental O2 inhalation Provided IV Lasix  due to renal failure BNP 245 slightly elevated   # SIRS Elevated lactic acid S/p IVF bolus.  Blood culture NGTD  # Severe hypokalemia, resolved - IV and p.o. replacement - Magnesium  and phosphorus levels wnl    # Hypotension Started midodrine 10 mg p.o. 3 times daily with holding parameters Monitor BP and titrate medications accordingly  # Chronic HFrEF - Volume contracted Discontinued Coreg  due to low blood pressure and interaction with colchicine  - Hold off Entresto  and torsemide     # History of CAD - No acute concern - Continue Aspirin , Plavix  and Atorvastatin    # Chronic peripheral neuropathy - No acute concern, continue gabapentin   # Acute gout flare of left knee joint  Uric acid 14.4 elevated S/p Solu-Medrol  40 mg IV twice daily followed by oral prednisone  Started colchicine  0.6 mg x 1 dose followed by 0.3 mg p.o. daily renal dose Consulted orthopedic surgery, s/p Arthrocentesis, MSU crystals positive, WBC 61K elevated.  Most likely gout.  No surgical intervention at this time Follow fluid culture NGTD  # Adrenal nodule, incidental finding Multiple lesions, including left adrenal mass measuring 1.6 cm, probable benign adenoma.Recommend follow-up adrenal washout CT in 1 year.  Recommended to follow-up with PCP   Body mass index is 25.21 kg/m.  Interventions:   Diet: Heart healthy diet DVT Prophylaxis: Subcutaneous Heparin   Advance goals of care discussion: Full code  Family Communication: family was not present at bedside, at the time of interview.  The pt provided permission to discuss medical plan with the family. Opportunity was given to ask question and all questions  were answered satisfactorily.   Disposition:  Pt is from Home, admitted with UTI and Left knee Gout, still has pian and on IV Abx, which precludes a safe discharge. Discharge to Home, when stable may need few days to improve.  Subjective: No significant events overnight, left knee pain is improving 6/10,/back pain 7/10.  Mild shortness of breath.  Overall feeling improvement.  Patient is also trying to ambulate.   Physical Exam: General: NAD, lying comfortably Appear in no distress, affect appropriate Eyes: PERRLA ENT: Oral Mucosa Clear, moist  Neck: no JVD,  Cardiovascular: S1 and S2 Present, no Murmur,  Respiratory: good respiratory effort, Bilateral Air entry equal and Decreased, no Crackles, no wheezes Abdomen: Bowel Sound present, Soft and no tenderness,  Skin: no rashes Extremities: Left knee tender and swollen improved., no significant erythema. S/p arthrocentesis Neurologic: without any new focal findings Gait not checked due to patient safety concerns  Vitals:   09/30/23 2056 10/01/23 0455 10/01/23 0837 10/01/23 1508  BP: 121/74 132/85 132/76 (!) 150/83  Pulse: 67 76 67 76  Resp: 16 16 18 18   Temp: 98.2 F (36.8 C) 98.1 F (36.7 C) 97.9 F (36.6 C)   TempSrc:  Oral Oral   SpO2: 94% 93% 92% 96%  Weight:      Height:        Intake/Output Summary (Last 24 hours) at 10/01/2023 1648 Last data filed at 10/01/2023 1300 Gross per 24 hour  Intake 863.64 ml  Output --  Net 863.64 ml   Filed Weights   09/28/23 1112  Weight: 73 kg    Data Reviewed: I have personally reviewed and interpreted daily labs, tele strips, imagings as discussed above. I reviewed all nursing notes, pharmacy notes, vitals, pertinent old records I have discussed plan of care as described above with RN and patient/family.  CBC: Recent Labs  Lab 09/28/23 1142 09/30/23 0352 10/01/23 0420  WBC 10.4 9.0 12.7*  NEUTROABS 8.7*  --   --   HGB 16.1* 13.1 11.0*  HCT 50.8* 41.8 35.0*  MCV 87.6  89.9 89.1  PLT 362 305 333   Basic Metabolic Panel: Recent Labs  Lab 09/28/23 1336 09/28/23 2111 09/29/23 0359 09/30/23 0352 09/30/23 1110 10/01/23 0420  NA  --  138 137 130* 130* 128*  K  --  3.2* 3.6 4.5 3.8 4.1  CL  --  96* 98 91* 91* 89*  CO2  --  27 27 25 25 26   GLUCOSE  --  109* 104* 179* 159* 151*  BUN  --  29* 33* 49* 50* 60*  CREATININE  --  1.94* 1.84* 2.97* 2.84* 2.66*  CALCIUM   --  9.0 8.8* 8.8* 8.8* 8.4*  MG 1.9  --   --  2.8*  --  2.5*  PHOS 2.8  --   --  5.4*  --  4.9*    Studies: US  RENAL Result Date: 09/30/2023 CLINICAL DATA:  Acute kidney insufficiency EXAM: RENAL / URINARY TRACT ULTRASOUND COMPLETE COMPARISON:  Ultrasound 12/24/2022. CT scan abdomen pelvis 09/28/2023 FINDINGS: Right Kidney: Renal measurements: 10.2 x 5.0 x 4.3 cm = volume: 116.4 mL. Echogenicity within normal limits. No mass or hydronephrosis visualized. Left Kidney: Renal measurements: 9.5 x 4.0 x 4.2 cm = volume:  82.1 mL. Echogenicity within normal limits. No mass or hydronephrosis visualized. Bladder: Appears normal for degree of bladder distention. No significant postvoid bladder residual reported. Other: None. IMPRESSION: No collecting system dilatation. Electronically Signed   By: Adrianna Horde M.D.   On: 09/30/2023 17:34    Scheduled Meds:  aspirin  EC  81 mg Oral Daily   atorvastatin   80 mg Oral QHS   clopidogrel   75 mg Oral Daily   colchicine   0.3 mg Oral Daily   docusate sodium  100 mg Oral Daily   fluticasone  furoate-vilanterol  1 puff Inhalation Daily   gabapentin   300 mg Oral BID   heparin   5,000 Units Subcutaneous Q12H   levothyroxine   125 mcg Oral Q0600   midodrine  10 mg Oral TID WC   PARoxetine   40 mg Oral Daily   [START ON 10/02/2023] predniSONE   40 mg Oral Q breakfast   Continuous Infusions:  sodium chloride  75 mL/hr at 10/01/23 1201   cefTRIAXone  (ROCEPHIN )  IV 2 g (10/01/23 0823)   PRN Meds: acetaminophen , ALPRAZolam , hydrALAZINE , HYDROmorphone  (DILAUDID ) injection,  ipratropium-albuterol , ondansetron  (ZOFRAN ) IV, oxyCODONE , polyethylene glycol  Time spent: 40 minutes  Author: Althia Atlas. MD [facility] 10/01/2023 4:48 PM  To reach On-call, see care teams to locate the attending and reach out to them via [email or web address]. If 7PM-7AM, please contact night-coverage If you still have difficulty reaching the attending provider, please page the [facility] (Director on Call) for [facility] on amion for assistance.

## 2023-10-02 DIAGNOSIS — A419 Sepsis, unspecified organism: Secondary | ICD-10-CM | POA: Diagnosis not present

## 2023-10-02 LAB — BASIC METABOLIC PANEL WITH GFR
Anion gap: 11 (ref 5–15)
BUN: 63 mg/dL — ABNORMAL HIGH (ref 6–20)
CO2: 23 mmol/L (ref 22–32)
Calcium: 8.2 mg/dL — ABNORMAL LOW (ref 8.9–10.3)
Chloride: 96 mmol/L — ABNORMAL LOW (ref 98–111)
Creatinine, Ser: 2.21 mg/dL — ABNORMAL HIGH (ref 0.44–1.00)
GFR, Estimated: 26 mL/min — ABNORMAL LOW (ref >60)
Glucose, Bld: 137 mg/dL — ABNORMAL HIGH (ref 70–99)
Potassium: 4.2 mmol/L (ref 3.5–5.1)
Sodium: 130 mmol/L — ABNORMAL LOW (ref 135–145)

## 2023-10-02 LAB — CBC
HCT: 36 % (ref 36.0–46.0)
Hemoglobin: 11 g/dL — ABNORMAL LOW (ref 12.0–15.0)
MCH: 27.6 pg (ref 26.0–34.0)
MCHC: 30.6 g/dL (ref 30.0–36.0)
MCV: 90.5 fL (ref 80.0–100.0)
Platelets: 403 10*3/uL — ABNORMAL HIGH (ref 150–400)
RBC: 3.98 MIL/uL (ref 3.87–5.11)
RDW: 15.9 % — ABNORMAL HIGH (ref 11.5–15.5)
WBC: 9.6 10*3/uL (ref 4.0–10.5)
nRBC: 0 % (ref 0.0–0.2)

## 2023-10-02 LAB — BRAIN NATRIURETIC PEPTIDE: B Natriuretic Peptide: >4500 pg/mL — ABNORMAL HIGH (ref 0.0–100.0)

## 2023-10-02 LAB — MAGNESIUM: Magnesium: 2.7 mg/dL — ABNORMAL HIGH (ref 1.7–2.4)

## 2023-10-02 LAB — PHOSPHORUS: Phosphorus: 4.2 mg/dL (ref 2.5–4.6)

## 2023-10-02 MED ORDER — ALUM & MAG HYDROXIDE-SIMETH 200-200-20 MG/5ML PO SUSP
30.0000 mL | ORAL | Status: DC | PRN
  Administered 2023-10-02: 30 mL via ORAL
  Filled 2023-10-02: qty 30

## 2023-10-02 MED ORDER — FUROSEMIDE 10 MG/ML IJ SOLN
40.0000 mg | Freq: Once | INTRAMUSCULAR | Status: AC
  Administered 2023-10-02: 40 mg via INTRAVENOUS
  Filled 2023-10-02: qty 4

## 2023-10-02 MED ORDER — PANTOPRAZOLE SODIUM 40 MG PO TBEC
40.0000 mg | DELAYED_RELEASE_TABLET | Freq: Every day | ORAL | Status: DC
  Administered 2023-10-02 – 2023-10-05 (×4): 40 mg via ORAL
  Filled 2023-10-02 (×4): qty 1

## 2023-10-02 MED ORDER — PROMETHAZINE HCL 25 MG PO TABS
25.0000 mg | ORAL_TABLET | Freq: Three times a day (TID) | ORAL | Status: DC | PRN
  Administered 2023-10-02 – 2023-10-05 (×3): 25 mg via ORAL
  Filled 2023-10-02 (×4): qty 1

## 2023-10-02 NOTE — Plan of Care (Signed)
   Problem: Education: Goal: Knowledge of General Education information will improve Description: Including pain rating scale, medication(s)/side effects and non-pharmacologic comfort measures Outcome: Progressing   Problem: Clinical Measurements: Goal: Will remain free from infection Outcome: Progressing

## 2023-10-02 NOTE — Progress Notes (Signed)
 [facility] Progress Note  Patient: Angelica Herrera    UEA:[id number]  DOA: 09/28/2023     Date of Service: the patient was seen and examined on 10/02/2023  Chief Complaint  Patient presents with   Generalized Body Aches   Brief hospital course: Angelica Herrera is a 53 y.o. female with medical history significant of CAD stenting, chronic HFrEF with LVEF 35-40%, renal artery stenosis status post renal artery stenting, CKD stage IIIb, HTN, GERD, anxiety/depression, chronic peripheral neuropathy, presented with worsening of dysuria right flank pain nauseous vomiting.   Symptoms started 5 days ago, patient started to have dysuria followed by new onset of right-sided flank pain radiating to right groin area, sharp-like, episode of subjective fever and chills.  Also started to have significant nausea with vomiting of stomach content and decreased p.o. intake due to nausea.  No diarrhea denies any chest pain or shortness of breath or cough. ED Course: Afebrile, blood pressure 140/80 O2 saturation 95% room air.  CT abdomen pelvis negative for acute findings, UA showed 3+ WBC 1+ RBC, WBC 10.6 hemoglobin 16 BUN 26 creatinine 1.8 compared to baseline 1.2-2.0, lactic acid 2.7> 1.8, K2.4 bicarb 29.   Patient was given IV bolus, ceftriaxone , IV and p.o. KCl.   Assessment and Plan:  # Acute UTI CT a/p: No acute intra-abdominal or intrapelvic abnormality. 2. Partially visualized at least trace to small volume pericardial effusion. - Continue ceftriaxone  - Urine cultures was ordered but it was canceled, did not know the reason.   # AKI, FeNA 0.6 pre-renal  # CKD stage IIIb - Hold off diuresis Bladder scan ruled out urinary retention Decreased urine output, s/p gentle hydration US  renal: No collecting system dilatation.  6/18 start IV fluid for hydration Watch for signs of volume overload and stop fluid if patient becomes short of breath Cr 1.84 -- 2.66--2.21 6/19 discontinued IV fluid  due to CHF, BNP elevated. Lasix  40 mg IV one-time dose given   # Acute hypoxic respiratory failure due to acute pulmonary edema could be due to IV fluid given due to sepsis CXR: Cardiomegaly with central pulmonary vascular congestion and streaky right basilar opacities, which could reflect asymmetric edema or atelectasis. Continue supplemental O2 inhalation Provided IV Lasix  due to renal failure BNP 245 slightly elevated   # SIRS Elevated lactic acid S/p IVF bolus.  Blood culture NGTD  # Severe hypokalemia, resolved - IV and p.o. replacement - Magnesium  and phosphorus levels wnl    # Hypotension S/p Midodrine 10 mg p.o. TID, d/c'd on 6/19, BP improved Monitor BP and titrate medications accordingly   # Chronic HFrEF - Volume contracted Discontinued Coreg  due to low blood pressure and interaction with colchicine  - Hold off Entresto  and torsemide  6/19 elevated BNP, no significant edema or shortness of breath.  Lasix  40 mg one-time dose given   # History of CAD - No acute concern - Continue Aspirin , Plavix  and Atorvastatin    # Chronic peripheral neuropathy - No acute concern, continue gabapentin   # Acute gout flare of left knee joint  Uric acid 14.4 elevated S/p Solu-Medrol  40 mg IV twice daily followed by oral prednisone  Started colchicine  0.6 mg x 1 dose followed by 0.3 mg p.o. daily renal dose Consulted orthopedic surgery, s/p Arthrocentesis, MSU crystals positive, WBC 61K elevated.  Most likely gout.  No surgical intervention at this time Fluid culture NGTD  # Adrenal nodule, incidental finding Multiple lesions, including left adrenal mass measuring 1.6 cm, probable benign adenoma.Recommend follow-up adrenal  washout CT in 1 year.  Recommended to follow-up with PCP   Body mass index is 25.21 kg/m.  Interventions:   Diet: Heart healthy diet DVT Prophylaxis: Subcutaneous Heparin     Advance goals of care discussion: Full code  Family Communication: family was  not present at bedside, at the time of interview.  The pt provided permission to discuss medical plan with the family. Opportunity was given to ask question and all questions were answered satisfactorily.   Disposition:  Pt is from Home, admitted with UTI and Left knee Gout, still has pian and on IV Abx, which precludes a safe discharge. Discharge to Home, when stable may need few days to improve.  Subjective: No significant events overnight, left knee pain is improving, very mild pain in the back, no significant shortness of breath.  Overall feeling improvement.   Physical Exam: General: NAD, lying comfortably Appear in no distress, affect appropriate Eyes: PERRLA ENT: Oral Mucosa Clear, moist  Neck: no JVD,  Cardiovascular: S1 and S2 Present, no Murmur,  Respiratory: good respiratory effort, Bilateral Air entry equal and Decreased, no Crackles, no wheezes Abdomen: Bowel Sound present, Soft and no tenderness,  Skin: no rashes Extremities: Left knee tender and swollen improved., no significant erythema. S/p arthrocentesis Neurologic: without any new focal findings Gait not checked due to patient safety concerns  Vitals:   10/01/23 2001 10/02/23 0423 10/02/23 0733 10/02/23 1409  BP: (!) 163/96 (!) 160/100 (!) 142/93 (!) 155/80  Pulse: 64 63 (!) 58 (!) 57  Resp: 16 16 18 16   Temp: 98 F (36.7 C) (!) 97.5 F (36.4 C) 98 F (36.7 C) 98 F (36.7 C)  TempSrc: Oral Oral    SpO2: 95% 92% 97% 97%  Weight:      Height:        Intake/Output Summary (Last 24 hours) at 10/02/2023 1719 Last data filed at 10/02/2023 1100 Gross per 24 hour  Intake 1410.56 ml  Output --  Net 1410.56 ml   Filed Weights   09/28/23 1112  Weight: 73 kg    Data Reviewed: I have personally reviewed and interpreted daily labs, tele strips, imagings as discussed above. I reviewed all nursing notes, pharmacy notes, vitals, pertinent old records I have discussed plan of care as described above with RN and  patient/family.  CBC: Recent Labs  Lab 09/28/23 1142 09/30/23 0352 10/01/23 0420 10/02/23 0402  WBC 10.4 9.0 12.7* 9.6  NEUTROABS 8.7*  --   --   --   HGB 16.1* 13.1 11.0* 11.0*  HCT 50.8* 41.8 35.0* 36.0  MCV 87.6 89.9 89.1 90.5  PLT 362 305 333 403*   Basic Metabolic Panel: Recent Labs  Lab 09/28/23 1336 09/28/23 2111 09/29/23 0359 09/30/23 0352 09/30/23 1110 10/01/23 0420 10/02/23 0402  NA  --    < > 137 130* 130* 128* 130*  K  --    < > 3.6 4.5 3.8 4.1 4.2  CL  --    < > 98 91* 91* 89* 96*  CO2  --    < > 27 25 25 26 23   GLUCOSE  --    < > 104* 179* 159* 151* 137*  BUN  --    < > 33* 49* 50* 60* 63*  CREATININE  --    < > 1.84* 2.97* 2.84* 2.66* 2.21*  CALCIUM   --    < > 8.8* 8.8* 8.8* 8.4* 8.2*  MG 1.9  --   --  2.8*  --  2.5* 2.7*  PHOS 2.8  --   --  5.4*  --  4.9* 4.2   < > = values in this interval not displayed.    Studies: No results found.   Scheduled Meds:  aspirin  EC  81 mg Oral Daily   atorvastatin   80 mg Oral QHS   clopidogrel   75 mg Oral Daily   colchicine   0.3 mg Oral Daily   docusate sodium  100 mg Oral Daily   fluticasone  furoate-vilanterol  1 puff Inhalation Daily   gabapentin   300 mg Oral BID   heparin   5,000 Units Subcutaneous Q12H   levothyroxine   125 mcg Oral Q0600   pantoprazole   40 mg Oral Daily   PARoxetine   40 mg Oral Daily   predniSONE   40 mg Oral Q breakfast   Continuous Infusions:  cefTRIAXone  (ROCEPHIN )  IV 2 g (10/02/23 0956)   PRN Meds: acetaminophen , ALPRAZolam , alum & mag hydroxide-simeth, hydrALAZINE , HYDROmorphone  (DILAUDID ) injection, ipratropium-albuterol , ondansetron  (ZOFRAN ) IV, oxyCODONE , polyethylene glycol, promethazine   Time spent: 40 minutes  Author: Althia Atlas. MD [facility] 10/02/2023 5:19 PM  To reach On-call, see care teams to locate the attending and reach out to them via [email or web address]. If 7PM-7AM, please contact night-coverage If you still have difficulty reaching the attending provider,  please page the [facility] (Director on Call) for [facility] on amion for assistance.

## 2023-10-03 ENCOUNTER — Telehealth (HOSPITAL_COMMUNITY): Payer: Self-pay | Admitting: Pharmacy Technician

## 2023-10-03 ENCOUNTER — Other Ambulatory Visit (HOSPITAL_COMMUNITY): Payer: Self-pay

## 2023-10-03 DIAGNOSIS — A419 Sepsis, unspecified organism: Secondary | ICD-10-CM | POA: Diagnosis not present

## 2023-10-03 LAB — BASIC METABOLIC PANEL WITH GFR
Anion gap: 13 (ref 5–15)
BUN: 56 mg/dL — ABNORMAL HIGH (ref 6–20)
CO2: 26 mmol/L (ref 22–32)
Calcium: 8.8 mg/dL — ABNORMAL LOW (ref 8.9–10.3)
Chloride: 99 mmol/L (ref 98–111)
Creatinine, Ser: 2 mg/dL — ABNORMAL HIGH (ref 0.44–1.00)
GFR, Estimated: 29 mL/min — ABNORMAL LOW (ref >60)
Glucose, Bld: 146 mg/dL — ABNORMAL HIGH (ref 70–99)
Potassium: 4.9 mmol/L (ref 3.5–5.1)
Sodium: 135 mmol/L (ref 135–145)

## 2023-10-03 LAB — CULTURE, BLOOD (SINGLE)
Culture: NO GROWTH
Culture: NO GROWTH

## 2023-10-03 LAB — CBC
HCT: 40.5 % (ref 36.0–46.0)
Hemoglobin: 12.3 g/dL (ref 12.0–15.0)
MCH: 27.8 pg (ref 26.0–34.0)
MCHC: 30.4 g/dL (ref 30.0–36.0)
MCV: 91.6 fL (ref 80.0–100.0)
Platelets: 430 10*3/uL — ABNORMAL HIGH (ref 150–400)
RBC: 4.42 MIL/uL (ref 3.87–5.11)
RDW: 15.9 % — ABNORMAL HIGH (ref 11.5–15.5)
WBC: 7.5 10*3/uL (ref 4.0–10.5)
nRBC: 0 % (ref 0.0–0.2)

## 2023-10-03 LAB — BODY FLUID CULTURE W GRAM STAIN
Culture: NO GROWTH
Special Requests: NORMAL

## 2023-10-03 MED ORDER — OXYCODONE HCL 5 MG PO TABS
5.0000 mg | ORAL_TABLET | ORAL | Status: DC | PRN
  Administered 2023-10-03 – 2023-10-05 (×5): 10 mg via ORAL
  Filled 2023-10-03 (×6): qty 2

## 2023-10-03 MED ORDER — HYDRALAZINE HCL 50 MG PO TABS
50.0000 mg | ORAL_TABLET | Freq: Once | ORAL | Status: AC
  Administered 2023-10-03: 50 mg via ORAL
  Filled 2023-10-03: qty 1

## 2023-10-03 MED ORDER — HYDRALAZINE HCL 50 MG PO TABS
50.0000 mg | ORAL_TABLET | Freq: Four times a day (QID) | ORAL | Status: DC | PRN
  Administered 2023-10-04: 50 mg via ORAL
  Filled 2023-10-03: qty 1

## 2023-10-03 MED ORDER — FUROSEMIDE 10 MG/ML IJ SOLN
40.0000 mg | Freq: Once | INTRAMUSCULAR | Status: AC
  Administered 2023-10-03: 40 mg via INTRAVENOUS
  Filled 2023-10-03: qty 4

## 2023-10-03 MED ORDER — AMLODIPINE BESYLATE 5 MG PO TABS
5.0000 mg | ORAL_TABLET | Freq: Every day | ORAL | Status: DC
  Administered 2023-10-03 – 2023-10-05 (×3): 5 mg via ORAL
  Filled 2023-10-03 (×3): qty 1

## 2023-10-03 MED ORDER — HYDRALAZINE HCL 20 MG/ML IJ SOLN
10.0000 mg | Freq: Four times a day (QID) | INTRAMUSCULAR | Status: DC | PRN
  Administered 2023-10-03: 10 mg via INTRAVENOUS

## 2023-10-03 NOTE — Progress Notes (Signed)
 [facility] Progress Note  Patient: Angelica Herrera    ZOX:[id number]  DOA: 09/28/2023     Date of Service: the patient was seen and examined on 10/03/2023  Chief Complaint  Patient presents with   Generalized Body Aches   Brief hospital course: Angelica Herrera is a 53 y.o. female with medical history significant of CAD stenting, chronic HFrEF with LVEF 35-40%, renal artery stenosis status post renal artery stenting, CKD stage IIIb, HTN, GERD, anxiety/depression, chronic peripheral neuropathy, presented with worsening of dysuria right flank pain nauseous vomiting.   Symptoms started 5 days ago, patient started to have dysuria followed by new onset of right-sided flank pain radiating to right groin area, sharp-like, episode of subjective fever and chills.  Also started to have significant nausea with vomiting of stomach content and decreased p.o. intake due to nausea.  No diarrhea denies any chest pain or shortness of breath or cough. ED Course: Afebrile, blood pressure 140/80 O2 saturation 95% room air.  CT abdomen pelvis negative for acute findings, UA showed 3+ WBC 1+ RBC, WBC 10.6 hemoglobin 16 BUN 26 creatinine 1.8 compared to baseline 1.2-2.0, lactic acid 2.7> 1.8, K2.4 bicarb 29.   Patient was given IV bolus, ceftriaxone , IV and p.o. KCl.   Assessment and Plan:  # Acute UTI CT a/p: No acute intra-abdominal or intrapelvic abnormality. 2. Partially visualized at least trace to small volume pericardial effusion. - s/p ceftriaxone  x 5 days - Urine cultures was ordered but it was canceled, don't know the reason.   # AKI, FeNA 0.6 pre-renal  # CKD stage IIIb - Hold off diuresis Bladder scan ruled out urinary retention Decreased urine output, s/p gentle hydration US  renal: No collecting system dilatation.  6/18 start IV fluid for hydration Watch for signs of volume overload and stop fluid if patient becomes short of breath Cr 1.84 -- 2.66--2.21 6/19 discontinued IV  fluid due to CHF, BNP elevated. Lasix  40 mg IV one-time dose given 6/20 Cr 2.0 improved, Lasix  40 mg IV one-time dose given today  # Acute hypoxic respiratory failure due to acute pulmonary edema could be due to IV fluid given due to sepsis CXR: Cardiomegaly with central pulmonary vascular congestion and streaky right basilar opacities, which could reflect asymmetric edema or atelectasis. S/p supplemental O2 inhalation, which was gradually weaned off.  Currently she is saturating well on room air.  # SIRS Elevated lactic acid S/p IVF bolus.  Blood culture NGTD  # Severe hypokalemia, resolved - IV and p.o. replacement - Magnesium  and phosphorus levels wnl    # Hypotension: Resolved S/p Midodrine 10 mg p.o. TID, d/c'd on 6/19, BP improved Monitor BP and titrate medications accordingly   # HTN and Chronic HFrEF - Patient was volume contracted on admission Discontinued Coreg  due to low blood pressure and interaction with colchicine  - Hold off Entresto  and torsemide  6/19 elevated BNP, no significant edema or shortness of breath.  Lasix  40 mg one-time dose given 6/20 started amlodipine 5 mg p.o. daily, use hydralazine  as needed.  # History of CAD - No acute concern - Continue Aspirin , Plavix  and Atorvastatin    # Chronic peripheral neuropathy - No acute concern, continue gabapentin   # Acute gout flare of left knee joint  Uric acid 14.4 elevated S/p Solu-Medrol  40 mg IV twice daily followed by oral prednisone  Started colchicine  0.6 mg x 1 dose followed by 0.3 mg p.o. daily renal dose Consulted orthopedic surgery, s/p Arthrocentesis, MSU crystals positive, WBC 61K elevated.  Most  likely gout.  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  Recommended to follow-up with PCP   Body mass index is  25.21 kg/m.  Interventions:   Diet: Heart healthy diet DVT Prophylaxis: Subcutaneous Heparin     Advance goals of care discussion: Full code  Family Communication: family was not present at bedside, at the time of interview.  The pt provided permission to discuss medical plan with the family. Opportunity was given to ask question and all questions were answered satisfactorily.   Disposition:  Pt is from Home, admitted with UTI and Left knee Gout, but she developed renal failure, IV fluid given, hypotension resolved, creatinine improving.  Resumed IV Lasix  due to elevated BNP.  Clinically stable, gradually improving.  S/p IV antibiotics for 5 days. Discharge to Home, when stable, most likely in 1 to 2 days  Subjective: No significant events overnight, back pain 6-7/10, left knee pain is improving 2/10 pain.  Patient is making urine, denies any worsening of shortness of breath, no chest pain.  No any other complaint   Physical Exam: General: NAD, lying comfortably Appear in no distress, affect appropriate Eyes: PERRLA ENT: Oral Mucosa Clear, moist  Neck: no JVD,  Cardiovascular: S1 and S2 Present, no Murmur,  Respiratory: good respiratory effort, Bilateral Air entry equal and Decreased, no Crackles, no wheezes Abdomen: Bowel Sound present, Soft and no tenderness,  Skin: no rashes Extremities: Left knee tender and swollen improved., no significant erythema. S/p arthrocentesis Neurologic: without any new focal findings Gait not checked due to patient safety concerns  Vitals:   10/02/23 2006 10/03/23 0609 10/03/23 0744 10/03/23 1410  BP: (!) 149/94 (!) 172/108 (!) 155/92 (!) 172/101  Pulse: 66 67 63 71  Resp: 15 16 16 18   Temp: 98 F (36.7 C) 98.4 F (36.9 C) 97.6 F (36.4 C) 97.6 F (36.4 C)  TempSrc: Oral Oral Oral Oral  SpO2: 97% 93% 94% 95%  Weight:      Height:        Intake/Output Summary (Last 24 hours) at 10/03/2023 1431 Last data filed at 10/03/2023 0900 Gross per  24 hour  Intake 480 ml  Output --  Net 480 ml   Filed Weights   09/28/23 1112  Weight: 73 kg    Data Reviewed: I have personally reviewed and interpreted daily labs, tele strips, imagings as discussed above. I reviewed all nursing notes, pharmacy notes, vitals, pertinent old records I have discussed plan of care as described above with RN and patient/family.  CBC: Recent Labs  Lab 09/28/23 1142 09/30/23 0352 10/01/23 0420 10/02/23 0402 10/03/23 0423  WBC 10.4 9.0 12.7* 9.6 7.5  NEUTROABS 8.7*  --   --   --   --   HGB 16.1* 13.1 11.0* 11.0* 12.3  HCT 50.8* 41.8 35.0* 36.0 40.5  MCV 87.6 89.9 89.1 90.5 91.6  PLT 362 305 333 403* 430*   Basic Metabolic Panel: Recent Labs  Lab 09/28/23 1336 09/28/23 2111 09/30/23 0352 09/30/23 1110 10/01/23 0420 10/02/23 0402 10/03/23 0423  NA  --    < > 130* 130* 128* 130* 135  K  --    < > 4.5 3.8 4.1 4.2 4.9  CL  --    < > 91* 91* 89* 96* 99  CO2  --    < > 25 25 26  23 26  GLUCOSE  --    < > 179* 159* 151* 137* 146*  BUN  --    < > 49* 50* 60* 63* 56*  CREATININE  --    < > 2.97* 2.84* 2.66* 2.21* 2.00*  CALCIUM   --    < > 8.8* 8.8* 8.4* 8.2* 8.8*  MG 1.9  --  2.8*  --  2.5* 2.7*  --   PHOS 2.8  --  5.4*  --  4.9* 4.2  --    < > = values in this interval not displayed.    Studies: No results found.   Scheduled Meds:  aspirin  EC  81 mg Oral Daily   atorvastatin   80 mg Oral QHS   clopidogrel   75 mg Oral Daily   colchicine   0.3 mg Oral Daily   docusate sodium  100 mg Oral Daily   fluticasone  furoate-vilanterol  1 puff Inhalation Daily   gabapentin   300 mg Oral BID   heparin   5,000 Units Subcutaneous Q12H   levothyroxine   125 mcg Oral Q0600   pantoprazole   40 mg Oral Daily   PARoxetine   40 mg Oral Daily   predniSONE   40 mg Oral Q breakfast   Continuous Infusions:   PRN Meds: acetaminophen , ALPRAZolam , alum & mag hydroxide-simeth, hydrALAZINE , HYDROmorphone  (DILAUDID ) injection, ipratropium-albuterol , ondansetron   (ZOFRAN ) IV, oxyCODONE , polyethylene glycol, promethazine   Time spent: 40 minutes  Author: Althia Atlas. MD [facility] 10/03/2023 2:31 PM  To reach On-call, see care teams to locate the attending and reach out to them via [email or web address]. If 7PM-7AM, please contact night-coverage If you still have difficulty reaching the attending provider, please page the [facility] (Director on Call) for [facility] on amion for assistance.

## 2023-10-03 NOTE — Progress Notes (Signed)
 Heart Failure Stewardship Pharmacy Note  PCP: Adeline Hone, PA-C PCP-Cardiologist: Antionette Kirks, MD  HPI: Angelica Herrera is a 53 y.o. female with CAD stenting, chronic HFrEF with LVEF 35-40%, renal artery stenosis status post renal artery stenting, CKD stage IIIb, HTN, GERD, anxiety/depression, chronic peripheral neuropathy who presented with right flank pain, dysuria, nausea, vomiting, chills. On admission, lactic acid was 2.7, HS-troponin was 26, lipase was 28, Bcx were negative. Later labs showed uric acid of 14.4 and BNP of 245.7. Later, repeat BNP was >4,500. Chest x-ray noted pulmonary vascular congestion and potential asymmetric edema.   Pertinent cardiac history: Aanterolateral ST elevation MI in 01/2012 with PTCA/DES to the left main complicated by hemodynamic instability and VT. LVEF at that time was 40-45%. Negative stress test 07/2015. LHC 07/2015 with 60% mid LAD, 30% mid RCA, patent LM stent with 10% stenosis of Ost LM to LM. Echo 09/2015 with LVEF of 35-40%, G1DD. Stress test in 02/2017 showing peri-infarct ischemia, intermediate risk. Echo in 02/2027 with LVEF 30-35%. Echo 02/2019 with LVEF 35-40%, GIIDD.   Pertinent Lab Values: Creat  Date Value Ref Range Status  03/20/2023 1.26 (H) 0.50 - 1.03 mg/dL Final   Creatinine, Ser  Date Value Ref Range Status  10/03/2023 2.00 (H) 0.44 - 1.00 mg/dL Final   BUN  Date Value Ref Range Status  10/03/2023 56 (H) 6 - 20 mg/dL Final  16/01/9603 35 (H) 6 - 24 mg/dL Final  54/12/8117 17 mg/dL Final    Comment:    1-47 NOTE: New Reference Range  06/21/14    Potassium  Date Value Ref Range Status  10/03/2023 4.9 3.5 - 5.1 mmol/L Final  08/11/2014 3.7 mmol/L Final    Comment:    3.5-5.1 NOTE: New Reference Range  06/21/14    Sodium  Date Value Ref Range Status  10/03/2023 135 135 - 145 mmol/L Final  11/15/2022 142 134 - 144 mmol/L Final  08/11/2014 135 mmol/L Final    Comment:    135-145 NOTE: New Reference Range   06/21/14    B Natriuretic Peptide  Date Value Ref Range Status  10/02/2023 >4,500.0 (H) 0.0 - 100.0 pg/mL Final    Comment:    Performed at [facility], [address] [place], [place] [address]   Magnesium   Date Value Ref Range Status  10/02/2023 2.7 (H) 1.7 - 2.4 mg/dL Final    Comment:    Performed at [facility], [address] [place], [place] [address]  04/09/2012 1.8 mg/dL Final    Comment:    6.5-7.8 THERAPEUTIC RANGE: 4-7 mg/dL TOXIC: > 10 mg/dL  -----------------------    Hemoglobin A1C  Date Value Ref Range Status  04/08/2012 5.7 4.2 - 6.3 % Final    Comment:    The American Diabetes Association recommends that a primary goal of therapy should be <7% and that physicians should reevaluate the treatment regimen in patients with HbA1c values consistently >8%.    Hgb A1c MFr Bld  Date Value Ref Range Status  03/20/2023 6.1 (H) <5.7 % of total Hgb Final    Comment:    For someone without known diabetes, a hemoglobin  A1c value between 5.7% and 6.4% is consistent with prediabetes and should be confirmed with a  follow-up test. . For someone with known diabetes, a value <7% indicates that their diabetes is well controlled. A1c targets should be individualized based on duration of diabetes, age, comorbid conditions, and other considerations. . This assay result is consistent  with an increased risk of diabetes. . Currently, no consensus exists regarding use of hemoglobin A1c for diagnosis of diabetes for children. .    TSH  Date Value Ref Range Status  03/20/2023 1.08 mIU/L Final    Comment:              Reference Range .           > or = 20 Years  0.40-4.50 .                Pregnancy Ranges           First trimester    0.26-2.66           Second trimester   0.55-2.73           Third trimester    0.43-2.91     Vital Signs: Temp:  [97.6 F (36.4 C)-98.4 F (36.9 C)] 97.6 F (36.4 C) (06/20 0744) Pulse Rate:  [57-67] 63  (06/20 0744) Cardiac Rhythm: Normal sinus rhythm;Bundle branch block (06/20 0714) Resp:  [15-16] 16 (06/20 0744) BP: (149-172)/(80-108) 155/92 (06/20 0744) SpO2:  [93 %-97 %] 94 % (06/20 0744)  Intake/Output Summary (Last 24 hours) at 10/03/2023 1001 Last data filed at 10/02/2023 1900 Gross per 24 hour  Intake 240 ml  Output --  Net 240 ml    Current Heart Failure Medications:  Loop diuretic: furosemide  40 mg IV  Beta-Blocker: none ACEI/ARB/ARNI: none MRA: none SGLT2i: none Other: none  Prior to admission Heart Failure Medications:  Loop diuretic: torsemide  20 mg daily Beta-Blocker: carvedilol  12.5 mg BID ACEI/ARB/ARNI: Entresto  97-103 mg BID MRA: none SGLT2i: none Other: none  Assessment: 1. 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 NYHA class II-III symptoms.  -Symptoms: Reports only complaint at this time is back pain. Denies shortness of breath and orthopnea. Potential trace LEE -Volume: Does not appear to be overtly volume up, however, creatinine is improving with diuresis and BNP was highly elevated. Agree with redosing furosemide  today. -Hemodynamics: BP is elevated, HR 60s. -BB: Consider restarting carvedilol  at 6.25 mg BID. HR is at lower end of normal, so clonidine  may require dose reduction.  -ACEI/ARB/ARNI: History of renal artery stenosis that has been repaired, tolerated Entresto  well PTA. Consider resuming if AKI stabilizes during admission. -MRA: Not a candidate at this time given AKI on CKD and high risk of ADE.  -SGLT2i: Not a candidate at this time with treatment for UTI.  Plan: 1) Medication changes recommended at this time: -Consider adding carvedilol  6.25 mg BID and reducing clonidine  to 0.2 mg daily  2) Patient assistance: -Pending  3) Education: - Patient has been educated on current HF medications and potential additions to HF medication regimen - Patient verbalizes understanding that  over the next few months, these medication doses may change and more medications may be added to optimize HF regimen - Patient has been educated on basic disease state pathophysiology and goals of therapy  Medication Assistance / Insurance Benefits Check: Does the patient have prescription insurance?    Type of insurance plan:  Does the patient qualify for medication assistance through manufacturers or grants? Pending   Outpatient Pharmacy: Prior to admission outpatient pharmacy: [facility]      Please do not hesitate to reach out with questions or concerns,  Bevely Brush, PharmD, CPP, BCPS Heart Failure Pharmacist  Phone - [phone] 10/03/2023 10:01 AM

## 2023-10-03 NOTE — Telephone Encounter (Signed)
 Requested Prescriptions  Pending Prescriptions Disp Refills   atorvastatin  (LIPITOR ) 80 MG tablet [Pharmacy Med Name: ATORVASTATIN  CALCIUM  80MG  TABLET] 90 tablet 0    Sig: TAKE (1) TABLET BY MOUTH AT BEDTIME     Cardiovascular:  Antilipid - Statins Failed - 10/03/2023  1:35 PM      Failed - Lipid Panel in normal range within the last 12 months    Cholesterol, Total  Date Value Ref Range Status  02/27/2018 136 100 - 199 mg/dL Final   Cholesterol  Date Value Ref Range Status  03/20/2023 136 <200 mg/dL Final   LDL Cholesterol (Calc)  Date Value Ref Range Status  03/20/2023 65 mg/dL (calc) Final    Comment:    Reference range: <100 . Desirable range <100 mg/dL for primary prevention;   <70 mg/dL for patients with CHD or diabetic patients  with > or = 2 CHD risk factors. Aaron Aas LDL-C is now calculated using the Martin-Hopkins  calculation, which is a validated novel method providing  better accuracy than the Friedewald equation in the  estimation of LDL-C.  Melinda Sprawls et al. Erroll Heard. 6440;347(42): 2061-2068  (http://education.[employer].com/faq/FAQ164)    HDL  Date Value Ref Range Status  03/20/2023 48 (L) > OR = 50 mg/dL Final  59/56/3875 53 >64 mg/dL Final   Triglycerides  Date Value Ref Range Status  03/20/2023 155 (H) <150 mg/dL Final         Passed - Patient is not pregnant      Passed - Valid encounter within last 12 months    Recent Outpatient Visits           3 months ago Other secondary acute gout of right wrist   [facility] [place] Buckler, FNP               clopidogrel  (PLAVIX ) 75 MG tablet [Pharmacy Med Name: CLOPIDOGREL  75MG  TABLET] 90 tablet 0    Sig: TAKE (1) TABLET BY MOUTH EVERY DAY     Hematology: Antiplatelets - clopidogrel  Failed - 10/03/2023  1:35 PM      Failed - PLT in normal range and within 180 days    Platelets  Date Value Ref Range Status  10/03/2023 430 (H) 150 - 400 K/uL Final  02/27/2018 315 150 - 450  x10E3/uL Final         Failed - Cr in normal range and within 360 days    Creat  Date Value Ref Range Status  03/20/2023 1.26 (H) 0.50 - 1.03 mg/dL Final   Creatinine, Ser  Date Value Ref Range Status  10/03/2023 2.00 (H) 0.44 - 1.00 mg/dL Final   Creatinine, Urine  Date Value Ref Range Status  10/01/2023 81 mg/dL Final    Comment:    Performed at [facility], [address] [place], [place] [address]         Passed - HCT in normal range and within 180 days    HCT  Date Value Ref Range Status  10/03/2023 40.5 36.0 - 46.0 % Final   Hematocrit  Date Value Ref Range Status  02/27/2018 51.5 (H) 34.0 - 46.6 % Final         Passed - HGB in normal range and within 180 days    Hemoglobin  Date Value Ref Range Status  10/03/2023 12.3 12.0 - 15.0 g/dL Final  18/84/1660 63.0 (H) 11.1 - 15.9 g/dL Final         Passed - Valid encounter within  last 6 months    Recent Outpatient Visits           3 months ago Other secondary acute gout of right wrist   [facility] Quinton Buckler, [place]

## 2023-10-03 NOTE — Telephone Encounter (Signed)
 Patient Product/process development scientist completed.    The patient is insured through [facility] [employer] [place].     Ran test claim for Entresto  24-26 mg and the current 30 day co-pay is $4.00.  Ran test claim for Farxiga 10 mg and Requires Prior Authorization  Ran test claim for Jardiance 10 mg and Requires Prior Authorization  This test claim was processed through [employer]- copay amounts may vary at other pharmacies due to [employer], or as the patient moves through the different stages of their insurance plan.     Morgan Arab, CPHT Pharmacy Technician III Certified Patient Advocate [facility] Patient Advocate Team Direct Number: [phone]  Fax: [phone]

## 2023-10-04 DIAGNOSIS — A419 Sepsis, unspecified organism: Secondary | ICD-10-CM | POA: Diagnosis not present

## 2023-10-04 LAB — CBC
HCT: 36.7 % (ref 36.0–46.0)
Hemoglobin: 11.5 g/dL — ABNORMAL LOW (ref 12.0–15.0)
MCH: 28.7 pg (ref 26.0–34.0)
MCHC: 31.3 g/dL (ref 30.0–36.0)
MCV: 91.5 fL (ref 80.0–100.0)
Platelets: 406 10*3/uL — ABNORMAL HIGH (ref 150–400)
RBC: 4.01 MIL/uL (ref 3.87–5.11)
RDW: 16 % — ABNORMAL HIGH (ref 11.5–15.5)
WBC: 10.5 10*3/uL (ref 4.0–10.5)
nRBC: 0 % (ref 0.0–0.2)

## 2023-10-04 LAB — BASIC METABOLIC PANEL WITH GFR
Anion gap: 9 (ref 5–15)
BUN: 57 mg/dL — ABNORMAL HIGH (ref 6–20)
CO2: 24 mmol/L (ref 22–32)
Calcium: 9.1 mg/dL (ref 8.9–10.3)
Chloride: 103 mmol/L (ref 98–111)
Creatinine, Ser: 1.54 mg/dL — ABNORMAL HIGH (ref 0.44–1.00)
GFR, Estimated: 40 mL/min — ABNORMAL LOW (ref >60)
Glucose, Bld: 114 mg/dL — ABNORMAL HIGH (ref 70–99)
Potassium: 4.7 mmol/L (ref 3.5–5.1)
Sodium: 136 mmol/L (ref 135–145)

## 2023-10-04 MED ORDER — HYDROXYZINE HCL 25 MG PO TABS
25.0000 mg | ORAL_TABLET | Freq: Three times a day (TID) | ORAL | Status: DC
  Administered 2023-10-04 – 2023-10-05 (×3): 25 mg via ORAL
  Filled 2023-10-04 (×3): qty 1

## 2023-10-04 MED ORDER — FUROSEMIDE 10 MG/ML IJ SOLN
40.0000 mg | Freq: Once | INTRAMUSCULAR | Status: AC
  Administered 2023-10-04: 40 mg via INTRAVENOUS
  Filled 2023-10-04: qty 4

## 2023-10-04 MED ORDER — SACUBITRIL-VALSARTAN 97-103 MG PO TABS
1.0000 | ORAL_TABLET | Freq: Two times a day (BID) | ORAL | Status: DC
  Administered 2023-10-04 – 2023-10-05 (×3): 1 via ORAL
  Filled 2023-10-04 (×3): qty 1

## 2023-10-04 MED ORDER — HYDRALAZINE HCL 20 MG/ML IJ SOLN
10.0000 mg | Freq: Once | INTRAMUSCULAR | Status: AC
  Administered 2023-10-04: 10 mg via INTRAVENOUS
  Filled 2023-10-04: qty 1

## 2023-10-04 MED ORDER — ALLOPURINOL 100 MG PO TABS
50.0000 mg | ORAL_TABLET | Freq: Every day | ORAL | Status: DC
  Administered 2023-10-04 – 2023-10-05 (×2): 50 mg via ORAL
  Filled 2023-10-04 (×2): qty 1

## 2023-10-04 MED ORDER — PREDNISONE 20 MG PO TABS
40.0000 mg | ORAL_TABLET | Freq: Every day | ORAL | Status: DC
  Administered 2023-10-05: 40 mg via ORAL
  Filled 2023-10-04: qty 2

## 2023-10-04 NOTE — Progress Notes (Signed)
 [facility] Progress Note  Patient: Angelica Herrera    FMW:[id number]  DOA: 09/28/2023     Date of Service: the patient was seen and examined on 10/04/2023  Chief Complaint  Patient presents with   Generalized Body Aches   Brief hospital course: Angelica Herrera is a 53 y.o. female with medical history significant of CAD stenting, chronic HFrEF with LVEF 35-40%, renal artery stenosis status post renal artery stenting, CKD stage IIIb, HTN, GERD, anxiety/depression, chronic peripheral neuropathy, presented with worsening of dysuria right flank pain nauseous vomiting.   Symptoms started 5 days ago, patient started to have dysuria followed by new onset of right-sided flank pain radiating to right groin area, sharp-like, episode of subjective fever and chills.  Also started to have significant nausea with vomiting of stomach content and decreased p.o. intake due to nausea.  No diarrhea denies any chest pain or shortness of breath or cough. ED Course: Afebrile, blood pressure 140/80 O2 saturation 95% room air.  CT abdomen pelvis negative for acute findings, UA showed 3+ WBC 1+ RBC, WBC 10.6 hemoglobin 16 BUN 26 creatinine 1.8 compared to baseline 1.2-2.0, lactic acid 2.7> 1.8, K2.4 bicarb 29.   Patient was given IV bolus, ceftriaxone , IV and p.o. KCl.   Assessment and Plan:  # Acute UTI CT a/p: No acute intra-abdominal or intrapelvic abnormality. 2. Partially visualized at least trace to small volume pericardial effusion. - s/p ceftriaxone  x 5 days - Urine cultures was ordered but it was canceled, don't know the reason.   # AKI, FeNA 0.6 pre-renal  # CKD stage IIIb - Hold off diuresis Bladder scan ruled out urinary retention Decreased urine output, s/p gentle hydration US  renal: No collecting system dilatation.  6/18 start IV fluid for hydration Watch for signs of volume overload and stop fluid if patient becomes short of breath Cr 1.84 -- 2.66--2.21 6/19 discontinued IV  fluid due to CHF, BNP elevated. Lasix  40 mg IV one-time dose given 6/20 Cr 2.0 improved, Lasix  40 mg IV x 1 dose 6/21 sCr 1.54 improved, Lasix  40 mg IV one-time dose given   # Acute hypoxic respiratory failure due to acute pulmonary edema could be due to IV fluid given due to sepsis CXR: Cardiomegaly with central pulmonary vascular congestion and streaky right basilar opacities, which could reflect asymmetric edema or atelectasis. S/p supplemental O2 inhalation, which was gradually weaned off.  Currently she is saturating well on room air.  # SIRS Elevated lactic acid S/p IVF bolus.  Blood culture NGTD  # Severe hypokalemia, resolved - IV and p.o. replacement - Magnesium  and phosphorus levels wnl    # Hypotension: Resolved S/p Midodrine  10 mg p.o. TID, d/c'd on 6/19, BP improved Monitor BP and titrate medications accordingly   # HTN and Chronic HFrEF - Patient was volume contracted on admission Discontinued Coreg  due to low blood pressure and interaction with colchicine  - Hold off torsemide  6/19 elevated BNP, no significant edema or shortness of breath.  Lasix  40 mg one-time dose given 6/20 started amlodipine  5 mg p.o. daily, use hydralazine  as needed. 6/21 resumed Entresto  home dose, renal functions improved  # History of CAD - No acute concern - Continue Aspirin , Plavix  and Atorvastatin    # Chronic peripheral neuropathy - No acute concern, continue gabapentin   # Acute gout flare of left knee joint  Uric acid 14.4 elevated S/p Solu-Medrol  40 mg IV twice daily followed by oral prednisone  Started colchicine  0.6 mg x 1 dose followed by 0.3 mg  p.o. daily renal dose Consulted orthopedic surgery, s/p Arthrocentesis, MSU crystals positive, WBC 61K elevated.  Most likely gout.  No surgical intervention at this time Fluid culture NGTD 6/21 started allopurinol  50 mg p.o. 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  Recommended to follow-up with PCP  # Depression and anxiety Continue Paxil  Xanax  0.5 mg p.o. BID prn 6/21 started Atarax  25 mg p.o. 3 times daily  Body mass index is 25.21 kg/m.  Interventions:   Diet: Heart healthy diet DVT Prophylaxis: Subcutaneous Heparin     Advance goals of care discussion: Full code  Family Communication: family was not present at bedside, at the time of interview.  The pt provided permission to discuss medical plan with the family. Opportunity was given to ask question and all questions were answered satisfactorily.   Disposition:  Pt is from Home, admitted with UTI and Left knee Gout, but she developed renal failure, IV fluid given, hypotension resolved, creatinine improving.  Resumed IV Lasix  due to elevated BNP.  Clinically stable, gradually improving.  S/p IV antibiotics for 5 days. Discharge to Home, when stable, most likely in 1 to 2 days  Subjective: No significant events overnight, still complaining of pain in the left knee 7/10, it is constant.  Patient does not feel comfortable going home, we will continue prednisone , and started allopurinol  Blood pressure seems elevated, resumed Entresto  We will continue to monitor for now, may need 1-2 more days to improve    Physical Exam: General: NAD, lying comfortably Appear in no distress, affect appropriate Eyes: PERRLA ENT: Oral Mucosa Clear, moist  Neck: no JVD,  Cardiovascular: S1 and S2 Present, no Murmur,  Respiratory: good respiratory effort, Bilateral Air entry equal and Decreased, no Crackles, no wheezes Abdomen: Bowel Sound present, Soft and no tenderness,  Skin: no rashes Extremities: Left knee tender and swollen improved., no significant erythema. S/p arthrocentesis Neurologic: without any new focal findings Gait not checked due to patient  safety concerns  Vitals:   10/03/23 1410 10/03/23 1608 10/03/23 2014 10/04/23 0834  BP: (!) 172/101 (!) 162/98 (!) 141/96 (!) 163/110  Pulse: 71 79 72 75  Resp: 18  18 18   Temp: 97.6 F (36.4 C)  97.9 F (36.6 C) 98 F (36.7 C)  TempSrc: Oral  Oral Oral  SpO2: 95% 96% 96% 94%  Weight:      Height:        Intake/Output Summary (Last 24 hours) at 10/04/2023 1300 Last data filed at 10/04/2023 0900 Gross per 24 hour  Intake 600 ml  Output --  Net 600 ml   Filed Weights   09/28/23 1112  Weight: 73 kg    Data Reviewed: I have personally reviewed and interpreted daily labs, tele strips, imagings as discussed above. I reviewed all nursing notes, pharmacy notes, vitals, pertinent old records I have discussed plan of care as described above with RN and patient/family.  CBC: Recent Labs  Lab 09/28/23 1142 09/30/23 0352 10/01/23 0420 10/02/23 0402 10/03/23 0423 10/04/23 0441  WBC 10.4 9.0 12.7* 9.6 7.5 10.5  NEUTROABS 8.7*  --   --   --   --   --   HGB 16.1* 13.1 11.0* 11.0* 12.3 11.5*  HCT 50.8* 41.8 35.0* 36.0 40.5 36.7  MCV 87.6 89.9 89.1 90.5 91.6 91.5  PLT 362 305 333  403* 430* 406*   Basic Metabolic Panel: Recent Labs  Lab 09/28/23 1336 09/28/23 2111 09/30/23 0352 09/30/23 1110 10/01/23 0420 10/02/23 0402 10/03/23 0423 10/04/23 0441  NA  --    < > 130* 130* 128* 130* 135 136  K  --    < > 4.5 3.8 4.1 4.2 4.9 4.7  CL  --    < > 91* 91* 89* 96* 99 103  CO2  --    < > 25 25 26 23 26 24   GLUCOSE  --    < > 179* 159* 151* 137* 146* 114*  BUN  --    < > 49* 50* 60* 63* 56* 57*  CREATININE  --    < > 2.97* 2.84* 2.66* 2.21* 2.00* 1.54*  CALCIUM   --    < > 8.8* 8.8* 8.4* 8.2* 8.8* 9.1  MG 1.9  --  2.8*  --  2.5* 2.7*  --   --   PHOS 2.8  --  5.4*  --  4.9* 4.2  --   --    < > = values in this interval not displayed.    Studies: No results found.   Scheduled Meds:  allopurinol   50 mg Oral Daily   amLODipine   5 mg Oral Daily   aspirin  EC  81 mg Oral Daily    atorvastatin   80 mg Oral QHS   clopidogrel   75 mg Oral Daily   colchicine   0.3 mg Oral Daily   docusate sodium   100 mg Oral Daily   fluticasone  furoate-vilanterol  1 puff Inhalation Daily   gabapentin   300 mg Oral BID   heparin   5,000 Units Subcutaneous Q12H   levothyroxine   125 mcg Oral Q0600   pantoprazole   40 mg Oral Daily   PARoxetine   40 mg Oral Daily   sacubitril -valsartan   1 tablet Oral BID   Continuous Infusions:   PRN Meds: acetaminophen , ALPRAZolam , alum & mag hydroxide-simeth, hydrALAZINE  **OR** hydrALAZINE , HYDROmorphone  (DILAUDID ) injection, ipratropium-albuterol , ondansetron  (ZOFRAN ) IV, oxyCODONE , polyethylene glycol, promethazine   Time spent: 40 minutes  Author: ELVAN SOR. MD [facility] 10/04/2023 1:00 PM  To reach On-call, see care teams to locate the attending and reach out to them via [email or web address]. If 7PM-7AM, please contact night-coverage If you still have difficulty reaching the attending provider, please page the [facility] (Director on Call) for [facility] on amion for assistance.

## 2023-10-04 NOTE — Plan of Care (Signed)

## 2023-10-05 DIAGNOSIS — A419 Sepsis, unspecified organism: Secondary | ICD-10-CM | POA: Diagnosis not present

## 2023-10-05 LAB — BASIC METABOLIC PANEL WITH GFR
Anion gap: 10 (ref 5–15)
BUN: 53 mg/dL — ABNORMAL HIGH (ref 6–20)
CO2: 24 mmol/L (ref 22–32)
Calcium: 8.7 mg/dL — ABNORMAL LOW (ref 8.9–10.3)
Chloride: 101 mmol/L (ref 98–111)
Creatinine, Ser: 1.42 mg/dL — ABNORMAL HIGH (ref 0.44–1.00)
GFR, Estimated: 44 mL/min — ABNORMAL LOW (ref >60)
Glucose, Bld: 93 mg/dL (ref 70–99)
Potassium: 4.5 mmol/L (ref 3.5–5.1)
Sodium: 135 mmol/L (ref 135–145)

## 2023-10-05 LAB — BRAIN NATRIURETIC PEPTIDE: B Natriuretic Peptide: 2681 pg/mL — ABNORMAL HIGH (ref 0.0–100.0)

## 2023-10-05 LAB — CBC
HCT: 39.1 % (ref 36.0–46.0)
Hemoglobin: 12.1 g/dL (ref 12.0–15.0)
MCH: 27.9 pg (ref 26.0–34.0)
MCHC: 30.9 g/dL (ref 30.0–36.0)
MCV: 90.1 fL (ref 80.0–100.0)
Platelets: 424 10*3/uL — ABNORMAL HIGH (ref 150–400)
RBC: 4.34 MIL/uL (ref 3.87–5.11)
RDW: 16.1 % — ABNORMAL HIGH (ref 11.5–15.5)
WBC: 13.2 10*3/uL — ABNORMAL HIGH (ref 4.0–10.5)
nRBC: 0 % (ref 0.0–0.2)

## 2023-10-05 MED ORDER — HYDROXYZINE HCL 25 MG PO TABS: 25.0000 mg | ORAL_TABLET | Freq: Three times a day (TID) | ORAL | 0 refills | Status: DC | PRN

## 2023-10-05 MED ORDER — AMLODIPINE BESYLATE 5 MG PO TABS: 5.0000 mg | ORAL_TABLET | Freq: Every day | ORAL | 0 refills | Status: DC

## 2023-10-05 MED ORDER — ONDANSETRON HCL 4 MG PO TABS: 4.0000 mg | ORAL_TABLET | Freq: Every day | ORAL | 1 refills | Status: DC | PRN

## 2023-10-05 MED ORDER — FUROSEMIDE 10 MG/ML IJ SOLN: 40.0000 mg | Freq: Once | INTRAMUSCULAR | Status: DC

## 2023-10-05 MED ORDER — PREDNISONE 20 MG PO TABS: 40.0000 mg | ORAL_TABLET | Freq: Every day | ORAL | 0 refills | Status: AC

## 2023-10-05 MED ORDER — ALLOPURINOL 100 MG PO TABS: 50.0000 mg | ORAL_TABLET | Freq: Every day | ORAL | 3 refills | Status: DC

## 2023-10-05 MED ORDER — COLCHICINE 0.6 MG PO TABS: 0.3000 mg | ORAL_TABLET | Freq: Every day | ORAL | 0 refills | Status: DC

## 2023-10-05 MED ORDER — PANTOPRAZOLE SODIUM 40 MG PO TBEC: 40.0000 mg | DELAYED_RELEASE_TABLET | Freq: Every day | ORAL | 0 refills | Status: DC

## 2023-10-05 MED ORDER — OXYCODONE HCL 5 MG PO TABS: 5.0000 mg | ORAL_TABLET | Freq: Three times a day (TID) | ORAL | 0 refills | Status: AC | PRN

## 2023-10-05 NOTE — Discharge Summary (Signed)
 [facility] Discharge Summary   Patient: Angelica Herrera FMW:[id number]  PCP: Leavy Mole, PA-C  Date of admission: 09/28/2023   Date of discharge:  10/05/2023     Discharge Diagnoses:  3Principal Problem:   Sepsis (HCC) Active Problems:   Acute gout of left knee   Acute pyelonephritis   Admitted From: Home Disposition:  Home   Recommendations for Outpatient Follow-up:  Follow-up with PCP in 1 week.  Repeat CBC and BMP in 1 week, repeat uric acid level after few weeks.  Follow-up with psych in 1 week Follow-up with orthopedics surgery if needed for left knee gout Started colchicine  and allopurinol  renal dose. continue colchicine  for 1 week and then continue only allopurinol .  Patient can resume Coreg  after stopping colchicine .  Follow-up with PCP and cardiology for further management of CHF as an outpatient Started amlodipine  in the meantime, patient was advised to stop amlodipine  after starting Coreg . Follow up LABS/TEST:  As above   Follow-up Information     Leavy Mole, PA-C Follow up in 1 week(s).   Specialty: Family Medicine Contact information: [address] [place] [address] [phone]         Psych Follow up in 1 week(s).                 Diet recommendation: Cardiac and Carb modified diet  Activity: The patient is advised to gradually reintroduce usual activities, as tolerated  Discharge Condition: stable  Code Status: Full code   History of present illness: As per the H and P dictated on admission. Hospital Course:  Angelica Herrera is a 53 y.o. female with medical history significant of CAD stenting, chronic HFrEF with LVEF 35-40%, renal artery stenosis status post renal artery stenting, CKD stage IIIb, HTN, GERD, anxiety/depression, chronic peripheral neuropathy, presented with worsening of dysuria right flank pain nauseous vomiting.   Symptoms started 5 days ago, patient started to have dysuria followed by new  onset of right-sided flank pain radiating to right groin area, sharp-like, episode of subjective fever and chills.  Also started to have significant nausea with vomiting of stomach content and decreased p.o. intake due to nausea.  No diarrhea denies any chest pain or shortness of breath or cough. ED Course: Afebrile, blood pressure 140/80 O2 saturation 95% room air.  CT abdomen pelvis negative for acute findings, UA showed 3+ WBC 1+ RBC, WBC 10.6 hemoglobin 16 BUN 26 creatinine 1.8 compared to baseline 1.2-2.0, lactic acid 2.7> 1.8, K2.4 bicarb 29.   Patient was given IV bolus, ceftriaxone , IV and p.o. KCl.     Assessment and Plan:   # Acute gout flare of left knee joint  Uric acid 14.4 elevated S/p Solu-Medrol  40 mg IV twice daily followed by oral prednisone  Started colchicine  0.6 mg x 1 dose followed by 0.3 mg p.o. daily renal dose Consulted orthopedic surgery, s/p Arthrocentesis, MSU crystals positive, WBC 61K elevated.  Most likely gout.  No surgical intervention at this time. Fluid culture NGTD 6/21 started allopurinol  50 mg p.o. daily, renal dose, may need to increase once renal functions improved Patient was discharged on prednisone  40 mg p.o. daily for 3 days, colchicine  0.3 mg p.o. daily for 1 week.  Allopurinol  50 mg p.o. daily. Patient was advised to stop colchicine  after 1 week and then start carvedilol .  # Acute UTI. Resolved  CT a/p: No acute intra-abdominal or intrapelvic abnormality. 2. Partially visualized at least trace to small volume pericardial effusion.  s/p ceftriaxone  x  5 days. Urine cultures was ordered but it was canceled, don't know the reason.    # AKI, FeNA 0.6 pre-renal  # CKD stage IIIb.  Held diuretics for initial few days. Bladder scan ruled out urinary retention. Decreased urine output, s/p gentle hydration.  US  renal: No collecting system dilatation.  6/18 start IV fluid for hydration Watch for signs of volume overload and stop fluid if patient becomes  short of breath Cr 1.84 -- 2.66--2.21 6/19 discontinued IV fluid due to CHF, BNP elevated. Lasix  40 mg IV one-time dose given 6/20 Cr 2.0 improved, Lasix  40 mg IV x 1 dose 6/21 sCr 1.54 improved, Lasix  40 mg IV one-time dose given 6/22 sCr  1.42 improved, Lasix  40 mg IV one-time dose given before discharge and patient was transition back to torsemide  home dose on discharge  # Acute hypoxic respiratory failure due to acute pulmonary edema could be due to IV fluid given due to sepsis CXR: Cardiomegaly with central pulmonary vascular congestion and streaky right basilar opacities, which could reflect asymmetric edema or atelectasis. S/p supplemental O2 inhalation, which was gradually weaned off.  Currently she is saturating well on room air.   # SIRS, Elevated lactic acid. S/p IVF bolus.  Blood culture NGTD # Severe hypokalemia, potassium repleted and resolved.   Magnesium  and phosphorus levels wnl # Hypotension: Resolved S/p Midodrine  10 mg p.o. TID, d/c'd on 6/19, BP improved  # HTN and Chronic HFrEF - Patient was volume contracted on admission Discontinued Coreg  due to low blood pressure and interaction with colchicine .  Patient was advised to stop colchicine  after 1 week and then resume Coreg .  Started amlodipine  in the meantime. - Hold off torsemide  6/19 elevated BNP, no significant edema or shortness of breath.  Lasix  40 mg one-time dose given 6/20 started amlodipine  5 mg p.o. daily, use hydralazine  as needed. 6/21 resumed Entresto  home dose, renal functions improved Patient was advised to discontinue amlodipine  after starting Coreg .  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  Recommended to follow-up with  PCP   # Depression and anxiety: Continue Paxil  S/p Xanax  0.5 mg p.o. BID prn 6/21 started Atarax  25 mg p.o. 3 times daily.  Discharged on Atarax  25 mg p.o. 3 times daily as needed for anxiety Offered psych consult during hospital stay but patient refused to be seen by psych as an inpatient and wanted to follow as an outpatient   Body mass index is 25.21 kg/m.  Nutrition Interventions:  Pain control  - [place]  Controlled Substance Reporting System database could not be reviewed, website was not working.  - Oxycodone  5 mg p.o. every 8 hourly as needed, 10 tablets prescription given.  - Patient was instructed, not to drive, operate heavy machinery, perform activities at heights, swimming or participation in water activities or provide baby sitting services while on Pain, Sleep and Anxiety Medications; until her outpatient Physician has advised to do so again.  - Also recommended to not to take more than prescribed Pain, Sleep and Anxiety Medications.  Patient was ambulatory without any assistance. On the day of the discharge the patient's vitals were stable, and no other acute medical condition were reported by patient. the patient was felt safe to be discharge at Home.  Consultants: Orthopedic surgery Procedures: Left  knee arthrocentesis, 50 mL fluid was tapped, positive for gout monosodium urate crystals, culture negative.  Discharge Exam: General: Appear in no distress, no Rash; Oral Mucosa Clear, moist. Cardiovascular: S1 and S2 Present, no Murmur, Respiratory: normal respiratory effort, Bilateral Air entry present and no Crackles, no wheezes Abdomen: Bowel Sound present, Soft and no tenderness, no hernia Extremities: no Pedal edema, no calf tenderness, left knee tenderness improving, effusion significantly improved Neurology: alert and oriented to time, place, and person affect appropriate.  Filed Weights   09/28/23 1112  Weight: 73 kg   Vitals:   10/05/23 0412 10/05/23  0844  BP: (!) 141/91 125/86  Pulse: 67 73  Resp: 16 19  Temp: 97.6 F (36.4 C) 98.8 F (37.1 C)  SpO2: 95% 92%    DISCHARGE MEDICATION: Allergies as of 10/05/2023   No Known Allergies      Medication List     PAUSE taking these medications    carvedilol  12.5 MG tablet Wait to take this until: October 14, 2023 Commonly known as: COREG  TAKE ONE (1) TABLET (12.5 MG TOTAL) BY MOUTH TWO (2) (TWO) TIMES DAILY WITH A MEAL.   colchicine  0.6 MG tablet Wait to take this until your doctor or other care provider tells you to start again. Take 2 tabs for initial dose, then take 1 tab daily until pain subsides You also have another medication with the same name that you may need to continue taking.       TAKE these medications    acetaminophen  500 MG tablet Commonly known as: TYLENOL  Take 1,000 mg by mouth every 6 (six) hours as needed.   allopurinol  100 MG tablet Commonly known as: ZYLOPRIM  Take 0.5 tablets (50 mg total) by mouth daily. Start taking on: October 06, 2023   amLODipine  5 MG tablet Commonly known as: NORVASC  Take 1 tablet (5 mg total) by mouth daily. Stop amlodipine  after starting Coreg  and monitor BP.  Follow with PCP to titrate medications accordingly Start taking on: October 06, 2023   aspirin  81 MG tablet Take 1 tablet (81 mg total) by mouth daily.   atorvastatin  80 MG tablet Commonly known as: LIPITOR  TAKE (1) TABLET BY MOUTH AT BEDTIME What changed: See the new instructions.   busPIRone  5 MG tablet Commonly known as: BUSPAR  TAKE 1 TO 3 TABLETS BY MOUTH TWICE DAILY AS NEEDED   cetirizine  10 MG tablet Commonly known as: ZYRTEC  TAKE (1) TABLET BY MOUTH  AT BEDTIME   clopidogrel  75 MG tablet Commonly known as: PLAVIX  TAKE (1) TABLET BY MOUTH EVERY DAY   colchicine  0.6 MG tablet Take 0.5 tablets (0.3 mg total) by mouth daily for 8 days. Start taking on: October 06, 2023 What changed: Another medication with the same name was paused. Ask your nurse or doctor  if you should take this medication.   Entresto  97-103 MG Generic drug: sacubitril -valsartan  TAKE ONE (1) TABLET BY MOUTH TWO (2) (TWO) TIMES DAILY.   gabapentin  300 MG capsule Commonly known as: NEURONTIN  TAKE (1) CAPSULE BY MOUTH TWICE DAILY   hydrOXYzine  25 MG tablet Commonly known as: ATARAX  Take 1 tablet (25 mg total) by mouth every 8 (eight) hours as needed for anxiety.   levothyroxine  125 MCG tablet Commonly known as: SYNTHROID  TAKE ONE (1) TABLET BY MOUTH DAILY BEFORE BREAKFAST.   nitroGLYCERIN  0.4 MG SL tablet Commonly known as: NITROSTAT  Place 1 tablet (0.4 mg total) under the tongue every 5 (five) minutes x 3 doses as needed for chest pain.  ondansetron  4 MG tablet Commonly known as: Zofran  Take 1 tablet (4 mg total) by mouth daily as needed for nausea or vomiting.   oxyCODONE  5 MG immediate release tablet Commonly known as: Oxy IR/ROXICODONE  Take 1-2 tablets (5-10 mg total) by mouth every 8 (eight) hours as needed for up to 5 days for severe pain (pain score 7-10).   pantoprazole  40 MG tablet Commonly known as: PROTONIX  Take 1 tablet (40 mg total) by mouth daily. Start taking on: October 06, 2023   PARoxetine  40 MG tablet Commonly known as: PAXIL  TAKE (1) TABLET BY MOUTH EVERY DAY   predniSONE  20 MG tablet Commonly known as: DELTASONE  Take 2 tablets (40 mg total) by mouth daily with breakfast for 3 days. Start taking on: October 06, 2023   torsemide  20 MG tablet Commonly known as: DEMADEX  Take 20 mg by mouth daily.       No Known Allergies Discharge Instructions     Call MD for:  difficulty breathing, headache or visual disturbances   Complete by: As directed    Call MD for:  extreme fatigue   Complete by: As directed    Call MD for:  persistant dizziness or light-headedness   Complete by: As directed    Call MD for:  persistant nausea and vomiting   Complete by: As directed    Call MD for:  redness, tenderness, or signs of infection (pain,  swelling, redness, odor or green/yellow discharge around incision site)   Complete by: As directed    Call MD for:  severe uncontrolled pain   Complete by: As directed    Call MD for:  temperature >100.4   Complete by: As directed    Diet - low sodium heart healthy   Complete by: As directed    Discharge instructions   Complete by: As directed    Follow-up with PCP in 1 week.  Repeat CBC and BMP in 1 week, repeat uric acid level after few weeks.  Follow-up with psych in 1 week Follow-up with orthopedics surgery if needed for left knee gout Started colchicine  and allopurinol  renal dose. continue colchicine  for 1 week and then continue only allopurinol .  Patient can resume Coreg  after stopping colchicine .  Follow-up with PCP and cardiology for further management of CHF as an outpatient Started amlodipine  in the meantime, patient was advised to stop amlodipine  after starting Coreg .   Increase activity slowly   Complete by: As directed        The results of significant diagnostics from this hospitalization (including imaging, microbiology, ancillary and laboratory) are listed below for reference.    Significant Diagnostic Studies: US  RENAL Result Date: 09/30/2023 CLINICAL DATA:  Acute kidney insufficiency EXAM: RENAL / URINARY TRACT ULTRASOUND COMPLETE COMPARISON:  Ultrasound 12/24/2022. CT scan abdomen pelvis 09/28/2023 FINDINGS: Right Kidney: Renal measurements: 10.2 x 5.0 x 4.3 cm = volume: 116.4 mL. Echogenicity within normal limits. No mass or hydronephrosis visualized. Left Kidney: Renal measurements: 9.5 x 4.0 x 4.2 cm = volume: 82.1 mL. Echogenicity within normal limits. No mass or hydronephrosis visualized. Bladder: Appears normal for degree of bladder distention. No significant postvoid bladder residual reported. Other: None. IMPRESSION: No collecting system dilatation. Electronically Signed   By: Ranell Bring M.D.   On: 09/30/2023 17:34   DG Chest Port 1 View Result Date:  09/29/2023 CLINICAL DATA:  Shortness of breath. EXAM: PORTABLE CHEST 1 VIEW COMPARISON:  12/25/2022. FINDINGS: Cardiomegaly, unchanged. Similar scarring/atelectasis in the left mid lung. Central pulmonary vascular congestion  with streaky right basilar opacities, which could reflect edema or atelectasis. No sizeable pleural effusion or pneumothorax. No acute osseous abnormality. IMPRESSION: Cardiomegaly with central pulmonary vascular congestion and streaky right basilar opacities, which could reflect asymmetric edema or atelectasis. Electronically Signed   By: Harrietta Sherry M.D.   On: 09/29/2023 16:17   CT ABDOMEN PELVIS W CONTRAST Result Date: 09/28/2023 CLINICAL DATA:  RLQ, R flank pain. +dysuria. Eval for appendicitis, urolithiasis, pyelonephritis EXAM: CT ABDOMEN AND PELVIS WITH CONTRAST TECHNIQUE: Multidetector CT imaging of the abdomen and pelvis was performed using the standard protocol following bolus administration of intravenous contrast. RADIATION DOSE REDUCTION: This exam was performed according to the departmental dose-optimization program which includes automated exposure control, adjustment of the mA and/or kV according to patient size and/or use of iterative reconstruction technique. CONTRAST:  80mL OMNIPAQUE  IOHEXOL  300 MG/ML  SOLN COMPARISON:  CT abdomen pelvis 12/24/2022 FINDINGS: Lower chest: At least trace to small volume pericardial effusion partially visualized. Hepatobiliary: Liver limb mildly enlarged measuring up to 18.5 cm. No focal liver abnormality. No gallstones, gallbladder wall thickening, or pericholecystic fluid. No biliary dilatation. Pancreas: No focal lesion. Normal pancreatic contour. No surrounding inflammatory changes. No main pancreatic ductal dilatation. Spleen: Normal in size without focal abnormality. Adrenals/Urinary Tract: Bilateral adrenal gland nodules measuring 1 cm on the right with density of 45 Hounsfield units and 1.6 cm on the left with density of 38  Hounsfield units. Bilateral kidneys enhance symmetrically. No hydronephrosis. No hydroureter.  No nephroureterolithiasis. The urinary bladder is unremarkable. Stomach/Bowel: Stomach is within normal limits. No evidence of bowel wall thickening or dilatation. Appendix appears normal. Vascular/Lymphatic: No abdominal aorta or iliac aneurysm. Severe atherosclerotic plaque of the aorta and its branches. No abdominal, pelvic, or inguinal lymphadenopathy. Reproductive: There is a 2 cm x 1.5 cm fluid density lesion along the anterior wall of the vagina head is suggestive of a Gardner cyst. Status post hysterectomy. No adnexal masses. Other: No intraperitoneal free fluid. No intraperitoneal free gas. No organized fluid collection. Musculoskeletal: No abdominal wall hernia or abnormality. No suspicious lytic or blastic osseous lesions. No acute displaced fracture. L5-S1 intervertebral disc space vacuum phenomenon and endplate sclerosis. IMPRESSION: 1. No acute intra-abdominal or intrapelvic abnormality. 2. Partially visualized at least trace to small volume pericardial effusion. 3. Incidental 2 cm Gartner cyst. 4. 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 5. Recommend follow-up adrenal washout CT in 1 year. If stable for ? 1 year, no further follow-up imaging. JACR 2017 Aug; 14(8):1038-44, JCAT 2016 Mar-Apr; 40(2):194-200, Urol J 2006 Spring; 3(2):71-4. 6.  Aortic Atherosclerosis (ICD10-I70.0). Electronically Signed   By: Morgane  Naveau M.D.   On: 09/28/2023 14:20   DG Knee Complete 4 Views Left Result Date: 09/28/2023 CLINICAL DATA:  L knee pain/swelling EXAM: LEFT KNEE - COMPLETE 4+ VIEW COMPARISON:  May 16, 2022 FINDINGS: No acute fracture or dislocation. Joint spaces and alignment are maintained. No area of erosion or osseous destruction. No unexpected radiopaque foreign body. Large joint effusion. IMPRESSION: Large joint effusion. No acute fracture or dislocation.  Electronically Signed   By: Corean Salter M.D.   On: 09/28/2023 11:55    Microbiology: Recent Results (from the past 240 hours)  Resp panel by RT-PCR (RSV, Flu A&B, Covid) Anterior Nasal Swab     Status: None   Collection Time: 09/28/23 11:42 AM   Specimen: Anterior Nasal Swab  Result Value Ref Range Status   SARS Coronavirus 2 by RT PCR NEGATIVE NEGATIVE  Final    Comment: (NOTE) SARS-CoV-2 target nucleic acids are NOT DETECTED.  The SARS-CoV-2 RNA is generally detectable in upper respiratory specimens during the acute phase of infection. The lowest concentration of SARS-CoV-2 viral copies this assay can detect is 138 copies/mL. A negative result does not preclude SARS-Cov-2 infection and should not be used as the sole basis for treatment or other patient management decisions. A negative result may occur with  improper specimen collection/handling, submission of specimen other than nasopharyngeal swab, presence of viral mutation(s) within the areas targeted by this assay, and inadequate number of viral copies(<138 copies/mL). A negative result must be combined with clinical observations, patient history, and epidemiological information. The expected result is Negative.  Fact Sheet for Patients:  BloggerCourse.com  Fact Sheet for Healthcare Providers:  SeriousBroker.it  This test is no t yet approved or cleared by the [place]  FDA and  has been authorized for detection and/or diagnosis of SARS-CoV-2 by FDA under an Emergency Use Authorization (EUA). This EUA will remain  in effect (meaning this test can be used) for the duration of the COVID-19 declaration under Section 564(b)(1) of the Act, 21 U.S.C.section 360bbb-3(b)(1), unless the authorization is terminated  or revoked sooner.       Influenza A by PCR NEGATIVE NEGATIVE Final   Influenza B by PCR NEGATIVE NEGATIVE Final    Comment: (NOTE) The Xpert Xpress  SARS-CoV-2/FLU/RSV plus assay is intended as an aid in the diagnosis of influenza from Nasopharyngeal swab specimens and should not be used as a sole basis for treatment. Nasal washings and aspirates are unacceptable for Xpert Xpress SARS-CoV-2/FLU/RSV testing.  Fact Sheet for Patients: BloggerCourse.com  Fact Sheet for Healthcare Providers: SeriousBroker.it  This test is not yet approved or cleared by the [place]  FDA and has been authorized for detection and/or diagnosis of SARS-CoV-2 by FDA under an Emergency Use Authorization (EUA). This EUA will remain in effect (meaning this test can be used) for the duration of the COVID-19 declaration under Section 564(b)(1) of the Act, 21 U.S.C. section 360bbb-3(b)(1), unless the authorization is terminated or revoked.     Resp Syncytial Virus by PCR NEGATIVE NEGATIVE Final    Comment: (NOTE) Fact Sheet for Patients: BloggerCourse.com  Fact Sheet for Healthcare Providers: SeriousBroker.it  This test is not yet approved or cleared by the [place]  FDA and has been authorized for detection and/or diagnosis of SARS-CoV-2 by FDA under an Emergency Use Authorization (EUA). This EUA will remain in effect (meaning this test can be used) for the duration of the COVID-19 declaration under Section 564(b)(1) of the Act, 21 U.S.C. section 360bbb-3(b)(1), unless the authorization is terminated or revoked.  Performed at [facility], [address] [place], [place] [address]   Blood culture (single)     Status: None   Collection Time: 09/28/23 11:42 AM   Specimen: BLOOD  Result Value Ref Range Status   Specimen Description BLOOD RIGHT ANTECUBITAL  Final   Special Requests   Final    BOTTLES DRAWN AEROBIC AND ANAEROBIC Blood Culture results may not be optimal due to an inadequate volume of blood received in culture bottles    Culture   Final    NO GROWTH 5 DAYS Performed at [facility], [address] [place], [place] [address]    Report Status 10/03/2023 FINAL  Final  Blood culture (single)     Status: None   Collection Time: 09/28/23 12:24 PM   Specimen: Left Antecubital; Blood  Result  Value Ref Range Status   Specimen Description LEFT ANTECUBITAL  Final   Special Requests   Final    BOTTLES DRAWN AEROBIC AND ANAEROBIC Blood Culture results may not be optimal due to an inadequate volume of blood received in culture bottles   Culture   Final    NO GROWTH 5 DAYS Performed at [facility], [address] [place], [place] [address]    Report Status 10/03/2023 FINAL  Final  Body fluid culture w Gram Stain     Status: None   Collection Time: 09/30/23  8:00 AM   Specimen: KNEE; Body Fluid  Result Value Ref Range Status   Specimen Description   Final    KNEE LEFT Performed at [facility], [address] [place], [place] [address]    Special Requests   Final    Normal Performed at [facility], [address] [place], [place] [address]    Gram Stain   Final    ABUNDANT WBC PRESENT, PREDOMINANTLY PMN NO ORGANISMS SEEN    Culture   Final    NO GROWTH 3 DAYS Performed at [facility], [address] [place], [place] [address]    Report Status 10/03/2023 FINAL  Final     Labs: CBC: Recent Labs  Lab 09/28/23 1142 09/30/23 0352 10/01/23 0420 10/02/23 0402 10/03/23 0423 10/04/23 0441 10/05/23 0902  WBC 10.4   < > 12.7* 9.6 7.5 10.5 13.2*  NEUTROABS 8.7*  --   --   --   --   --   --   HGB 16.1*   < > 11.0* 11.0* 12.3 11.5* 12.1  HCT 50.8*   < > 35.0* 36.0 40.5 36.7 39.1  MCV 87.6   < > 89.1 90.5 91.6 91.5 90.1  PLT 362   < > 333 403* 430* 406* 424*   < > = values in this interval not displayed.   Basic Metabolic Panel: Recent Labs  Lab 09/28/23 1336 09/28/23 2111 09/30/23 0352 09/30/23 1110 10/01/23 0420 10/02/23 0402 10/03/23 0423  10/04/23 0441 10/05/23 0902  NA  --    < > 130*   < > 128* 130* 135 136 135  K  --    < > 4.5   < > 4.1 4.2 4.9 4.7 4.5  CL  --    < > 91*   < > 89* 96* 99 103 101  CO2  --    < > 25   < > 26 23 26 24 24   GLUCOSE  --    < > 179*   < > 151* 137* 146* 114* 93  BUN  --    < > 49*   < > 60* 63* 56* 57* 53*  CREATININE  --    < > 2.97*   < > 2.66* 2.21* 2.00* 1.54* 1.42*  CALCIUM   --    < > 8.8*   < > 8.4* 8.2* 8.8* 9.1 8.7*  MG 1.9  --  2.8*  --  2.5* 2.7*  --   --   --   PHOS 2.8  --  5.4*  --  4.9* 4.2  --   --   --    < > = values in this interval not displayed.   Liver Function Tests: Recent Labs  Lab 09/28/23 1142  AST 20  ALT 10  ALKPHOS 99  BILITOT 1.6*  PROT 8.9*  ALBUMIN 4.3   Recent Labs  Lab 09/28/23  1142  LIPASE 28   No results for input(s): AMMONIA in the last 168 hours. Cardiac Enzymes: No results for input(s): CKTOTAL, CKMB, CKMBINDEX, TROPONINI in the last 168 hours. BNP (last 3 results) Recent Labs    09/29/23 1605 10/02/23 0402 10/05/23 0902  BNP 245.7* >4,500.0* 2,681.0*   CBG: No results for input(s): GLUCAP in the last 168 hours.  Time spent: 35 minutes  Signed:  Elvan Sor  [facility] 10/05/2023 11:02 AM

## 2023-10-05 NOTE — Plan of Care (Signed)

## 2023-10-05 NOTE — Plan of Care (Signed)

## 2023-10-25 DIAGNOSIS — Z419 Encounter for procedure for purposes other than remedying health state, unspecified: Secondary | ICD-10-CM | POA: Diagnosis not present

## 2023-11-18 ENCOUNTER — Other Ambulatory Visit: Payer: Self-pay | Admitting: Nurse Practitioner

## 2023-11-25 DIAGNOSIS — Z419 Encounter for procedure for purposes other than remedying health state, unspecified: Secondary | ICD-10-CM | POA: Diagnosis not present

## 2023-11-26 ENCOUNTER — Other Ambulatory Visit: Payer: Self-pay | Admitting: Physician Assistant

## 2023-11-26 DIAGNOSIS — I2510 Atherosclerotic heart disease of native coronary artery without angina pectoris: Secondary | ICD-10-CM

## 2023-11-28 NOTE — Telephone Encounter (Signed)
 Requested Prescriptions  Pending Prescriptions Disp Refills   atorvastatin  (LIPITOR ) 80 MG tablet [Pharmacy Med Name: ATORVASTATIN  CALCIUM  80MG  TABLET] 90 tablet 1    Sig: TAKE (1) TABLET BY MOUTH AT BEDTIME     Cardiovascular:  Antilipid - Statins Failed - 11/28/2023  2:32 PM      Failed - Lipid Panel in normal range within the last 12 months    Cholesterol, Total  Date Value Ref Range Status  02/27/2018 136 100 - 199 mg/dL Final   Cholesterol  Date Value Ref Range Status  03/20/2023 136 <200 mg/dL Final   LDL Cholesterol (Calc)  Date Value Ref Range Status  03/20/2023 65 mg/dL (calc) Final    Comment:    Reference range: <100 . Desirable range <100 mg/dL for primary prevention;   <70 mg/dL for patients with CHD or diabetic patients  with > or = 2 CHD risk factors. SABRA LDL-C is now calculated using the Martin-Hopkins  calculation, which is a validated novel method providing  better accuracy than the Friedewald equation in the  estimation of LDL-C.  Gladis APPLETHWAITE et al. SANDREA. 7986;689(80): 2061-2068  (http://education.[employer].com/faq/FAQ164)    HDL  Date Value Ref Range Status  03/20/2023 48 (L) > OR = 50 mg/dL Final  88/84/7980 53 >60 mg/dL Final   Triglycerides  Date Value Ref Range Status  03/20/2023 155 (H) <150 mg/dL Final         Passed - Patient is not pregnant      Passed - Valid encounter within last 12 months    Recent Outpatient Visits           4 months ago Other secondary acute gout of right wrist   [facility] Gareth Mliss [place], [place]

## 2023-12-01 ENCOUNTER — Other Ambulatory Visit: Payer: Self-pay | Admitting: Physician Assistant

## 2023-12-01 DIAGNOSIS — I2510 Atherosclerotic heart disease of native coronary artery without angina pectoris: Secondary | ICD-10-CM

## 2023-12-05 ENCOUNTER — Other Ambulatory Visit: Payer: Self-pay | Admitting: Family Medicine

## 2023-12-05 DIAGNOSIS — G8929 Other chronic pain: Secondary | ICD-10-CM

## 2023-12-08 ENCOUNTER — Other Ambulatory Visit: Payer: Self-pay | Admitting: Family Medicine

## 2023-12-08 ENCOUNTER — Ambulatory Visit: Payer: Self-pay

## 2023-12-08 DIAGNOSIS — G8929 Other chronic pain: Secondary | ICD-10-CM

## 2023-12-08 NOTE — Telephone Encounter (Unsigned)
 Copied from CRM #[id number]. Topic: Clinical - Medication Refill >> Dec 08, 2023 10:15 AM Martinique E wrote: Medication: gabapentin  (NEURONTIN ) 300 MG capsule  Has the patient contacted their pharmacy? Yes (Agent: If no, request that the patient contact the pharmacy for the refill. If patient does not wish to contact the pharmacy document the reason why and proceed with request.) (Agent: If yes, when and what did the pharmacy advise?)  This is the patient's preferred pharmacy:  [facility] - [place], [place] - [address] [place] [address] Phone: [phone] Fax: [phone]  Is this the correct pharmacy for this prescription? Yes If no, delete pharmacy and type the correct one.   Has the prescription been filled recently? No  Is the patient out of the medication? Yes  Has the patient been seen for an appointment in the last year OR does the patient have an upcoming appointment? Yes  Can we respond through MyChart? Yes  Agent: Please be advised that Rx refills may take up to 3 business days. We ask that you follow-up with your pharmacy.

## 2023-12-08 NOTE — Telephone Encounter (Signed)
 FYI Only or Action Required?: Action required by provider: clinical question for provider.  Patient was last seen in primary care on 07/04/2023 by Gareth Mliss F[place], FNP.  Called Nurse Triage reporting Urinary Frequency.  Symptoms began several days ago.  Interventions attempted: Rest, hydration, or home remedies.  Symptoms are: unchanged.  Triage Disposition: See HCP Within 4 Hours (Or PCP Triage)  Patient/caregiver understands and will follow disposition?: No, wishes to speak with PCP, pt declines appt, reports she would like UA at upcoming 09/05 OV, will call back if symptoms worsen or visit UC/ED          Copied from CRM #[id number]. Topic: Clinical - Red Word Triage >> Dec 08, 2023 10:13 AM Angelica Herrera wrote: [facility] that prompted transfer to Nurse Triage: Possible UTI. Patient stated she is having some lower back pain, with a fever and chills. Symptoms have been going on for 3-4 days. Reason for Disposition  Side (flank) or lower back pain present  Answer Assessment - Initial Assessment Questions 1. SYMPTOM: What's the main symptom you're concerned about? (Herrera.g., frequency, incontinence)     Low back pain 2. ONSET: When did the symptoms start?     X 4 days 3. PAIN: Is there any pain? If Yes, ask: How bad is it? (Scale: 1-10; mild, moderate, severe)     5/10 4. CAUSE: What do you think is causing the symptoms?     Possible UTI 5. OTHER SYMPTOMS: Do you have any other symptoms? (Herrera.g., blood in urine, fever, flank pain, pain with urination)     Chills, warm - feels feverish, has not measured  Protocols used: Urinary Symptoms-A-AH

## 2023-12-08 NOTE — Telephone Encounter (Signed)
 Requested Prescriptions  Pending Prescriptions Disp Refills   gabapentin  (NEURONTIN ) 300 MG capsule [Pharmacy Med Name: GABAPENTIN  300MG  CAPSULE] 180 capsule 0    Sig: TAKE (1) CAPSULE BY MOUTH TWICE DAILY     Neurology: Anticonvulsants - gabapentin  Failed - 12/08/2023 10:26 AM      Failed - Cr in normal range and within 360 days    Creat  Date Value Ref Range Status  03/20/2023 1.26 (H) 0.50 - 1.03 mg/dL Final   Creatinine, Ser  Date Value Ref Range Status  10/05/2023 1.42 (H) 0.44 - 1.00 mg/dL Final   Creatinine, Urine  Date Value Ref Range Status  10/01/2023 81 mg/dL Final    Comment:    Performed at [facility], [address] [place], [place] [address]         Passed - Completed PHQ-2 or PHQ-9 in the last 360 days      Passed - Valid encounter within last 12 months    Recent Outpatient Visits           5 months ago Other secondary acute gout of right wrist   [facility] Gareth Mliss [place], [place]

## 2023-12-09 NOTE — Telephone Encounter (Signed)
 Requested Prescriptions  Refused Prescriptions Disp Refills   gabapentin  (NEURONTIN ) 300 MG capsule 180 capsule 0     Neurology: Anticonvulsants - gabapentin  Failed - 12/09/2023  2:45 PM      Failed - Cr in normal range and within 360 days    Creat  Date Value Ref Range Status  03/20/2023 1.26 (H) 0.50 - 1.03 mg/dL Final   Creatinine, Ser  Date Value Ref Range Status  10/05/2023 1.42 (H) 0.44 - 1.00 mg/dL Final   Creatinine, Urine  Date Value Ref Range Status  10/01/2023 81 mg/dL Final    Comment:    Performed at [facility], [address] [place], [place] [address]         Passed - Completed PHQ-2 or PHQ-9 in the last 360 days      Passed - Valid encounter within last 12 months    Recent Outpatient Visits           5 months ago Other secondary acute gout of right wrist   [facility] Gareth Mliss [place], [place]

## 2023-12-19 ENCOUNTER — Ambulatory Visit: Admitting: Family Medicine

## 2023-12-26 DIAGNOSIS — Z419 Encounter for procedure for purposes other than remedying health state, unspecified: Secondary | ICD-10-CM | POA: Diagnosis not present

## 2023-12-28 ENCOUNTER — Emergency Department
Admission: EM | Admit: 2023-12-28 | Discharge: 2023-12-28 | Disposition: A | Discharge status: Home / Self Care | Attending: Emergency Medicine | Admitting: Emergency Medicine

## 2023-12-28 ENCOUNTER — Other Ambulatory Visit: Payer: Self-pay

## 2023-12-28 DIAGNOSIS — M79671 Pain in right foot: Secondary | ICD-10-CM | POA: Diagnosis present

## 2023-12-28 DIAGNOSIS — M1009 Idiopathic gout, multiple sites: Secondary | ICD-10-CM | POA: Insufficient documentation

## 2023-12-28 DIAGNOSIS — M109 Gout, unspecified: Secondary | ICD-10-CM | POA: Diagnosis not present

## 2023-12-28 DIAGNOSIS — M25539 Pain in unspecified wrist: Secondary | ICD-10-CM | POA: Diagnosis not present

## 2023-12-28 DIAGNOSIS — M79673 Pain in unspecified foot: Secondary | ICD-10-CM | POA: Diagnosis not present

## 2023-12-28 LAB — CBC WITH DIFFERENTIAL/PLATELET
Abs Immature Granulocytes: 0.05 K/uL (ref 0.00–0.07)
Basophils Absolute: 0.1 K/uL (ref 0.0–0.1)
Basophils Relative: 0 %
Eosinophils Absolute: 0.1 K/uL (ref 0.0–0.5)
Eosinophils Relative: 1 %
HCT: 48.9 % — ABNORMAL HIGH (ref 36.0–46.0)
Hemoglobin: 15.6 g/dL — ABNORMAL HIGH (ref 12.0–15.0)
Immature Granulocytes: 0 %
Lymphocytes Relative: 10 %
Lymphs Abs: 1.2 K/uL (ref 0.7–4.0)
MCH: 31 pg (ref 26.0–34.0)
MCHC: 31.9 g/dL (ref 30.0–36.0)
MCV: 97.2 fL (ref 80.0–100.0)
Monocytes Absolute: 0.7 K/uL (ref 0.1–1.0)
Monocytes Relative: 6 %
Neutro Abs: 10.3 K/uL — ABNORMAL HIGH (ref 1.7–7.7)
Neutrophils Relative %: 83 %
Platelets: 173 K/uL (ref 150–400)
RBC: 5.03 MIL/uL (ref 3.87–5.11)
RDW: 15.5 % (ref 11.5–15.5)
WBC: 12.4 K/uL — ABNORMAL HIGH (ref 4.0–10.5)
nRBC: 0 % (ref 0.0–0.2)

## 2023-12-28 LAB — BASIC METABOLIC PANEL WITH GFR
Anion gap: 13 (ref 5–15)
BUN: 30 mg/dL — ABNORMAL HIGH (ref 6–20)
CO2: 22 mmol/L (ref 22–32)
Calcium: 9.2 mg/dL (ref 8.9–10.3)
Chloride: 98 mmol/L (ref 98–111)
Creatinine, Ser: 1.63 mg/dL — ABNORMAL HIGH (ref 0.44–1.00)
GFR, Estimated: 37 mL/min — ABNORMAL LOW (ref >60)
Glucose, Bld: 131 mg/dL — ABNORMAL HIGH (ref 70–99)
Potassium: 3.7 mmol/L (ref 3.5–5.1)
Sodium: 133 mmol/L — ABNORMAL LOW (ref 135–145)

## 2023-12-28 MED ORDER — HYDROCODONE-ACETAMINOPHEN 5-325 MG PO TABS: 2.0000 | ORAL_TABLET | Freq: Four times a day (QID) | ORAL | 0 refills | Status: DC | PRN

## 2023-12-28 MED ORDER — PREDNISONE 10 MG PO TABS: ORAL_TABLET | ORAL | 0 refills | Status: DC

## 2023-12-28 MED ORDER — MORPHINE SULFATE (PF) 4 MG/ML IV SOLN
4.0000 mg | Freq: Once | INTRAVENOUS | Status: AC
  Administered 2023-12-28: 4 mg via INTRAVENOUS
  Filled 2023-12-28: qty 1

## 2023-12-28 MED ORDER — ONDANSETRON HCL 4 MG/2ML IJ SOLN
4.0000 mg | INTRAMUSCULAR | Status: AC
  Administered 2023-12-28: 4 mg via INTRAVENOUS
  Filled 2023-12-28: qty 2

## 2023-12-28 MED ORDER — DEXAMETHASONE SODIUM PHOSPHATE 10 MG/ML IJ SOLN
10.0000 mg | Freq: Once | INTRAMUSCULAR | Status: AC
  Administered 2023-12-28: 10 mg via INTRAVENOUS
  Filled 2023-12-28: qty 1

## 2023-12-28 NOTE — ED Triage Notes (Signed)
 Pt BIB [facility] d/t bilat feet and left wrist - states it is d/t GOUT - hx of same.    BP 171/106 HR 81 O2 97%  CBG 101

## 2023-12-28 NOTE — ED Provider Notes (Signed)
 [facility] Provider Note    Event Date/Time   First MD Initiated Contact with Patient 12/28/23 0159     (approximate)   History   Foot Pain   HPI Angelica Herrera is a 53 y.o. 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  She presents sobbing by EMS complaining of pain that started yesterday and both feet, both ankles, and her left wrist.  She said that it feels the same way her gout feels when it acts up.  She is also having some pain in her left knee but it is less so than the other joints.  There is no obvious swelling although she said that it feels like it is swollen.  She said that her medications have been messed up since she was admitted to the hospital in June.  She used to take colchicine  regularly but was told she could not take colchicine  along with her Coreg , so now she is on Coreg  and not on colchicine .  She was switched to allopurinol  but she said it does nothing.  She has not had a fever, nausea, vomiting, chest pain, nor shortness of breath.     Physical Exam   Triage Vital Signs: ED Triage Vitals  Encounter Vitals Group     BP 12/28/23 0147 (!) 169/95     Girls Systolic BP Percentile --      Girls Diastolic BP Percentile --      Boys Systolic BP Percentile --      Boys Diastolic BP Percentile --      Pulse Rate 12/28/23 0147 80     Resp 12/28/23 0147 20     Temp 12/28/23 0147 98.9 F (37.2 C)     Temp Source 12/28/23 0147 Oral     SpO2 12/28/23 0147 95 %     Weight 12/28/23 0201 73 kg (160 lb 15 oz)     Height 12/28/23 0201 1.702 m (5' 7)     Head Circumference --      Peak Flow --      Pain Score 12/28/23 0144 10     Pain Loc --      Pain Education --      Exclude from Growth Chart --     Most recent vital signs: Vitals:   12/28/23 0230 12/28/23 0345  BP: (!) 150/108 122/80  Pulse: 70 67  Resp:    Temp:    SpO2: 98% 100%    General: Awake, alert, sobbing loudly, but nontoxic  appearance. CV:  Good peripheral perfusion.  Regular rate and rhythm. Resp:  Normal effort. Speaking easily and comfortably, no accessory muscle usage nor intercostal retractions.   Abd:  No distention.  Other:  She reports severe tenderness to palpation and range of motion of all of the joints as described above, most notably her left wrist and both ankles, but I see no erythema, no swelling, and there is no palpable deformities.  Her compartments are all soft and easily compressible.  There is no indication of cellulitis or joint infection or effusion.   ED Results / Procedures / Treatments   Labs (all labs ordered are listed, but only abnormal results are displayed) Labs Reviewed  CBC WITH DIFFERENTIAL/PLATELET - Abnormal; Notable for the following components:      Result Value   WBC 12.4 (*)    Hemoglobin 15.6 (*)    HCT 48.9 (*)    Neutro Abs 10.3 (*)  All other components within normal limits  BASIC METABOLIC PANEL WITH GFR - Abnormal; Notable for the following components:   Sodium 133 (*)    Glucose, Bld 131 (*)    BUN 30 (*)    Creatinine, Ser 1.63 (*)    GFR, Estimated 37 (*)    All other components within normal limits      PROCEDURES:  Critical Care performed: No  Procedures    IMPRESSION / MDM / ASSESSMENT AND PLAN / ED COURSE  I reviewed the triage vital signs and the nursing notes.                              Differential diagnosis includes, but is not limited to, polyarticular gout, acute on chronic pain syndrome, much less likely septic arthritis.  Patient's presentation is most consistent with exacerbation of chronic illness.  Labs/studies ordered: BMP, CBC  Interventions/Medications given:  Medications  morphine  (PF) 4 MG/ML injection 4 mg (4 mg Intravenous Given 12/28/23 9786)  ondansetron  (ZOFRAN ) injection 4 mg (4 mg Intravenous Given 12/28/23 9786)  dexamethasone  (DECADRON ) injection 10 mg (10 mg Intravenous Given 12/28/23 0346)    (Note:   hospital course my include additional interventions and/or labs/studies not listed above.)   Given the patient's extreme discomfort, I ordered morphine  4 mg IV and Zofran  4 mg IV while checking basic labs to see what is her renal function.  She has a history of chronic kidney disease and that may affect medication dosing.  According to the medical literature she should not start on colchicine  for 14 days after stopping Coreg , which seems unrealistic.  Will reassess after labs are available   Clinical Course as of 12/28/23 0513  [place] Dec 28, 2023  0508 The patient is feeling much better.  She agrees with the plan for prednisone  course with Norco for breakthrough pain and will follow-up with her family provider.  The patient's medical screening exam is reassuring with no indication of an emergent medical condition requiring hospitalization or additional evaluation at this point.  The patient is safe and appropriate for discharge and outpatient follow up. [CF]    Clinical Course User Index [CF] Gordan Huxley, MD     FINAL CLINICAL IMPRESSION(S) / ED DIAGNOSES   Final diagnoses:  Polyarticular gout     Rx / DC Orders   ED Discharge Orders          Ordered    predniSONE  (DELTASONE ) 10 MG tablet        12/28/23 0510    HYDROcodone -acetaminophen  (NORCO/VICODIN) 5-325 MG tablet  Every 6 hours PRN        12/28/23 0510             Note:  This document was prepared using Dragon voice recognition software and may include unintentional dictation errors.   Gordan Huxley, MD 12/28/23 [phone]

## 2023-12-28 NOTE — Discharge Instructions (Signed)
 Please take the prescribed prednisone  course as recommended.  Continue with your other regular medications.  You can take over-the-counter Tylenol  as needed for pain and according to label instructions.  Take Norco as prescribed for severe pain. Do not drink alcohol, drive or participate in any other potentially dangerous activities while taking this medication as it may make you sleepy. Do not take this medication with any other sedating medications, either prescription or over-the-counter. If you were prescribed Percocet or Vicodin, do not take these with acetaminophen  (Tylenol ) as it is already contained within these medications.   This medication is an opiate (or narcotic) pain medication and can be habit forming.  Use it as little as possible to achieve adequate pain control.  Do not use or use it with extreme caution if you have a history of opiate abuse or dependence.  If you are on a pain contract with your primary care doctor or a pain specialist, be sure to let them know you were prescribed this medication today from the [facility].  This medication is intended for your use only - do not give any to anyone else and keep it in a secure place where nobody else, especially children, have access to it.  It will also cause or worsen constipation, so you may want to consider taking an over-the-counter stool softener while you are taking this medication.

## 2024-01-02 ENCOUNTER — Ambulatory Visit: Admitting: Family Medicine

## 2024-01-14 ENCOUNTER — Ambulatory Visit (INDEPENDENT_AMBULATORY_CARE_PROVIDER_SITE_OTHER): Admitting: Family Medicine

## 2024-01-14 ENCOUNTER — Encounter: Payer: Self-pay | Admitting: Family Medicine

## 2024-01-14 ENCOUNTER — Other Ambulatory Visit: Payer: Self-pay | Admitting: Family Medicine

## 2024-01-14 VITALS — BP 110/74 | HR 88 | Resp 16 | Ht 67.0 in | Wt 175.5 lb

## 2024-01-14 DIAGNOSIS — M1A09X0 Idiopathic chronic gout, multiple sites, without tophus (tophi): Secondary | ICD-10-CM | POA: Diagnosis not present

## 2024-01-14 DIAGNOSIS — I2510 Atherosclerotic heart disease of native coronary artery without angina pectoris: Secondary | ICD-10-CM

## 2024-01-14 DIAGNOSIS — M542 Cervicalgia: Secondary | ICD-10-CM | POA: Diagnosis not present

## 2024-01-14 DIAGNOSIS — M545 Low back pain, unspecified: Secondary | ICD-10-CM

## 2024-01-14 DIAGNOSIS — N390 Urinary tract infection, site not specified: Secondary | ICD-10-CM

## 2024-01-14 DIAGNOSIS — Z09 Encounter for follow-up examination after completed treatment for conditions other than malignant neoplasm: Secondary | ICD-10-CM | POA: Diagnosis not present

## 2024-01-14 DIAGNOSIS — M109 Gout, unspecified: Secondary | ICD-10-CM | POA: Insufficient documentation

## 2024-01-14 DIAGNOSIS — E038 Other specified hypothyroidism: Secondary | ICD-10-CM

## 2024-01-14 DIAGNOSIS — E782 Mixed hyperlipidemia: Secondary | ICD-10-CM

## 2024-01-14 DIAGNOSIS — F3341 Major depressive disorder, recurrent, in partial remission: Secondary | ICD-10-CM

## 2024-01-14 DIAGNOSIS — F411 Generalized anxiety disorder: Secondary | ICD-10-CM | POA: Diagnosis not present

## 2024-01-14 DIAGNOSIS — G8929 Other chronic pain: Secondary | ICD-10-CM

## 2024-01-14 LAB — POCT URINALYSIS DIPSTICK
Bilirubin, UA: NEGATIVE
Blood, UA: NEGATIVE
Glucose, UA: NEGATIVE
Ketones, UA: NEGATIVE
Leukocytes, UA: NEGATIVE
Nitrite, UA: NEGATIVE
Odor: NORMAL
Protein, UA: NEGATIVE
Spec Grav, UA: 1.01 (ref 1.010–1.025)
Urobilinogen, UA: 0.2 U/dL
pH, UA: 6 (ref 5.0–8.0)

## 2024-01-14 MED ORDER — CLONAZEPAM 0.5 MG PO TABS: 0.5000 mg | ORAL_TABLET | Freq: Two times a day (BID) | ORAL | 0 refills | Status: DC | PRN

## 2024-01-14 MED ORDER — LEVOTHYROXINE SODIUM 125 MCG PO TABS: 125.0000 ug | ORAL_TABLET | Freq: Every day | ORAL | 3 refills | Status: AC

## 2024-01-14 MED ORDER — ATORVASTATIN CALCIUM 80 MG PO TABS: 80.0000 mg | ORAL_TABLET | Freq: Every day | ORAL | 3 refills | Status: AC

## 2024-01-14 MED ORDER — GABAPENTIN 300 MG PO CAPS
300.0000 mg | ORAL_CAPSULE | Freq: Two times a day (BID) | ORAL | 1 refills | Status: AC
Start: 2024-01-14 — End: ?

## 2024-01-14 MED ORDER — ALLOPURINOL 100 MG PO TABS: 100.0000 mg | ORAL_TABLET | Freq: Every day | ORAL | 1 refills | Status: AC

## 2024-01-14 MED ORDER — BUSPIRONE HCL 5 MG PO TABS: ORAL_TABLET | ORAL | 3 refills | Status: AC

## 2024-01-14 MED ORDER — COLCHICINE 0.6 MG PO TABS: ORAL_TABLET | ORAL | 5 refills | Status: AC

## 2024-01-14 MED ORDER — CLOPIDOGREL BISULFATE 75 MG PO TABS: ORAL_TABLET | ORAL | 1 refills | Status: AC

## 2024-01-14 MED ORDER — HYDROXYZINE HCL 25 MG PO TABS: 25.0000 mg | ORAL_TABLET | Freq: Three times a day (TID) | ORAL | 3 refills | Status: AC | PRN

## 2024-01-14 MED ORDER — PAROXETINE HCL 40 MG PO TABS: ORAL_TABLET | ORAL | 3 refills | Status: AC

## 2024-01-14 NOTE — Assessment & Plan Note (Signed)
>>  ASSESSMENT AND PLAN FOR CKD (CHRONIC KIDNEY DISEASE), STAGE IV (HCC) WRITTEN ON 01/14/2024 10:35 PM BY Sumire Halbleib, PA-C  Lab Results  Component Value Date   EGFR 51 (L) 03/20/2023   EGFR 21 (L) 11/15/2022   EGFR 42 (L) 05/28/2022   EGFR 55 (L) 12/27/2021   EGFR 39 (L) 01/31/2021   Recheck renal function with labs - she declines to do today but will do in the next ~2 weeks

## 2024-01-14 NOTE — Assessment & Plan Note (Signed)
 Multiple flares over the past couple years, recent admission June with flare started on low dose allopurinol  (renal dosing) with recent increase to 100 mg which she states she tolerated, but did not note an improvement in her sx, unfortunately she was recommended to f/up with PCP after admission for recheck and management of labs and meds Lab Results  Component Value Date   LABURIC 14.4 (H) 09/29/2023   LABURIC 7.8 (H) 02/27/2018   LABURIC 10.1 (H) 08/20/2017   Continue allopurinol  same dose until recheck of all labs Likely needs referral to specialists given severity of sx and elevation of labs  Low purine diet If needing colchicine  hold statin and she hold carvedilol  due to past SE when taking - instructed pt to discuss plan with cardiology

## 2024-01-14 NOTE — Assessment & Plan Note (Signed)
 Acute exacerbation of anxiety sx due to increased stressors and running out of her daily medications, paxil  40 mg which she has been on long term (many years) Discussed regretful withdrawal sx and worsening of sx which hope will improve with getting back on medications.  She can continue to try buspar  and hydroxyzine  - however with severity of sx and limited finances to pay for med refills she anticipates not getting those now while focusing on other higher priority and more effective/and needed meds Klonopin  helpful in the past, she understands its a benzo, controlled substance and can be sedating, no driving or operating machinery, hopefully she can take BID for a day or two to blunt her severe anxiety/panic sx and then start to use daily prn and then sparingly after 5-7 days.  Discussed limited supply and reserving some for severe sx.  Currently the medication is likely to provide more benefit than potential for harm, and without stronger medications I worry her sx may harm her with her emotional distress.  Psych resources discussed and offered. Pt feels comfortable with current plan to prioritize getting paxil  refilled and restarted, klonopin  use now decrease use as soon as able in next few days to week, and then other rescue meds and psych resources hopefully will be helpful once back on paxil .   Phq reviewed and highly positive with severe depressive sx, no SI, HI, AVH, GAD 7 also highly positive with severe anxiety sx.

## 2024-01-14 NOTE — Progress Notes (Signed)
 Name: Angelica Herrera   MRN: [id number]    DOB: Jul 11, 1970   Date:01/14/2024       Progress Note  Chief Complaint  Patient presents with   Medical Management of Chronic Issues   Back Pain    X1 week lower radiates to the R side     Subjective:   Angelica Herrera is a 53 y.o. female, presents to clinic for med refills, gout flares/ED f/up, needs some routine meds filled  She states she ran out of psych meds and thyroid  meds for 1 week to 1 month, with increased psych sx and increased stress/anxiety  She last did routine OV in Dec 2024, acute OV with other provider March 2025, her scheduled f/up OV in June x 2 she no-showed (second no show she was actually admitted inpt), no hospital f/up after D/C and unfortunately PCP unavailability occurred additionally this last month- though last routine f/up was recommended in June - lost to routine f/up, sig mental health history and new cardiac issues/health changes, hospital admission with lack of HFU and limited finances and increase stress all contributing to barriers to pt getting needed care.    Out of thyroid  meds, previously on levothyroxine  125 mcg daily - last OV Dec  Lab Results  Component Value Date   TSH 1.08 03/20/2023    Recurrent gout flares - prior acute OV in our office by colleague, ED visits and multiple joints affected She notes allopurinol  is more effective, ED provider started her on allopurinol  50 mg daily which she reports did nothing Labs last done with ED encounter, very elevated Uric acid When she takes colchicine  she holds other meds, she took her last allopurinol  100 mg yesterday - has not noted any SE from this since starting In June She did find that drinking tea cause gout flare - [place] meat and other triggers are known, she hasn't been on allopurinol  before or consulted with specialists prior to June, no ETOH Currently left knee if swollen and painful. Lab Results  Component Value Date   LABURIC 14.4  (H) 09/29/2023   Joint pain?  For generalized arthritis pain and sx pt asks about taking something she's seen on a commercial - Omega XL    Out of paxil , hx of complex mental/behavioral health hx Previously has been managed on paxil , had buspar  and hydroxyzine  for prn use, did need benzos for rescue in the past is feeling like she may need something like that now until she gets paxil  back in her system.     01/14/2024    2:04 PM 03/20/2023   10:21 AM 02/17/2023   11:08 AM  Depression screen PHQ 2/9  Decreased Interest 3 0 0  Down, Depressed, Hopeless 3 0 0  PHQ - 2 Score 6 0 0  Altered sleeping 3  0  Tired, decreased energy 3  0  Change in appetite 3  0  Feeling bad or failure about yourself  0  0  Trouble concentrating 3  0  Moving slowly or fidgety/restless 0  0  Suicidal thoughts 0  0  PHQ-9 Score 18  0  Difficult doing work/chores Very difficult  Not difficult at all      01/14/2024    2:17 PM 07/30/2022    9:12 AM 01/10/2022    2:05 PM 12/27/2021    2:09 PM  GAD 7 : Generalized Anxiety Score  Nervous, Anxious, on Edge 3 3 2 3   Control/stop worrying 3 3 3  3  Worry too much - different things 3 3 3 3   Trouble relaxing 3 3 2 3   Restless 3 3 2 3   Easily annoyed or irritable 3 3 3 3   Afraid - awful might happen 3 1 2 3   Total GAD 7 Score 21 19 17 21   Anxiety Difficulty Extremely difficult Extremely difficult Somewhat difficult Very difficult  She is tearful today, expresses stress and guilt, worried about her children, trying to help them financially and its causing her financial stress and limited $ as well.  Not currently est with psych or a therapist  She's also concerned for UTI with low back pain, no hematuria, she states she was admitted previously for UTI x 8 d and wants to make sure she prevents something like that from happening ever again, reviewed urine dip with her today Results for orders placed or performed in visit on 01/14/24  POCT urinalysis dipstick    Collection Time: 01/14/24  2:23 PM  Result Value Ref Range   Color, UA Yellow    Clarity, UA Cloudy    Glucose, UA Negative Negative   Bilirubin, UA Negative    Ketones, UA Negative    Spec Grav, UA 1.010 1.010 - 1.025   Blood, UA Negative    pH, UA 6.0 5.0 - 8.0   Protein, UA Negative Negative   Urobilinogen, UA 0.2 0.2 or 1.0 E.U./dL   Nitrite, UA Negative    Leukocytes, UA Negative Negative   Appearance YELLOW    Odor normal    Discussed the use of AI scribe software for clinical note transcription with the patient, who gave verbal consent to proceed.  History of Present Illness Angelica Herrera is a 53 year old female with anxiety and gout who presents with exacerbation of anxiety symptoms due to medication lapse.  Anxiety symptoms - Exacerbation of anxiety symptoms over the past month due to running out of Paxil  for two weeks - Symptoms include extreme crying and laughing without cause - Describes anxiety as 'screaming out the board' - Financial stress while supporting three children has contributed to increased anxiety - Klonopin  has been effective for anxiety management in similar situations in the past  Medication adherence and access issues - Has been without Plavix  and cholesterol medication for the entire month due to financial difficulties - Continues to take Entresto  and carvedilol  for blood pressure/cardiac management per specialists   Gout and joint pain - History of gout with last uric acid level elevated in June - Started on allopurinol  in the hospital but has not noticed significant improvement - Colchicine  is effective for acute gout flares, typically taken for four days to resolve symptoms - Currently out of colchicine  and has experienced severe pain during flares, including wrist pain described as 'somebody was sawing my wrist off' - Expresses interest in Omega XL for joint pain but is unsure about potential drug interactions  Renal and urinary tract  infection history - Hospitalized for eight days in June due to kidney infection and urinary tract infection - Unaware of infection until onset of severe back pain, which prompted emergency care  Thyroid  function - Concerned about thyroid  function - TSH checked in December was within normal limits on 125 mcg dose     Current Outpatient Medications:    acetaminophen  (TYLENOL ) 500 MG tablet, Take 1,000 mg by mouth every 6 (six) hours as needed., Disp: , Rfl:    allopurinol  (ZYLOPRIM ) 100 MG tablet, Take 0.5 tablets (50 mg total) by mouth  daily., Disp: 45 tablet, Rfl: 3   aspirin  81 MG tablet, Take 1 tablet (81 mg total) by mouth daily., Disp: 30 tablet, Rfl:    atorvastatin  (LIPITOR ) 80 MG tablet, TAKE (1) TABLET BY MOUTH AT BEDTIME, Disp: 90 tablet, Rfl: 0   busPIRone  (BUSPAR ) 5 MG tablet, TAKE 1 TO 3 TABLETS BY MOUTH TWICE DAILY AS NEEDED, Disp: 120 tablet, Rfl: 0   carvedilol  (COREG ) 12.5 MG tablet, TAKE ONE (1) TABLET (12.5 MG TOTAL) BY MOUTH TWO (2) (TWO) TIMES DAILY WITH A MEAL., Disp: 180 tablet, Rfl: 3   cetirizine  (ZYRTEC ) 10 MG tablet, TAKE (1) TABLET BY MOUTH  AT BEDTIME, Disp: 30 tablet, Rfl: 6   clopidogrel  (PLAVIX ) 75 MG tablet, TAKE (1) TABLET BY MOUTH EVERY DAY, Disp: 90 tablet, Rfl: 0   [Paused] colchicine  0.6 MG tablet, Take 2 tabs for initial dose, then take 1 tab daily until pain subsides, Disp: 30 tablet, Rfl: 0   gabapentin  (NEURONTIN ) 300 MG capsule, TAKE (1) CAPSULE BY MOUTH TWICE DAILY, Disp: 180 capsule, Rfl: 0   HYDROcodone -acetaminophen  (NORCO/VICODIN) 5-325 MG tablet, Take 2 tablets by mouth every 6 (six) hours as needed for moderate pain (pain score 4-6) or severe pain (pain score 7-10)., Disp: 16 tablet, Rfl: 0   hydrOXYzine  (ATARAX ) 25 MG tablet, Take 1 tablet (25 mg total) by mouth every 8 (eight) hours as needed for anxiety., Disp: 30 tablet, Rfl: 0   nitroGLYCERIN  (NITROSTAT ) 0.4 MG SL tablet, Place 1 tablet (0.4 mg total) under the tongue every 5 (five)  minutes x 3 doses as needed for chest pain., Disp: 25 tablet, Rfl: 3   ondansetron  (ZOFRAN ) 4 MG tablet, Take 1 tablet (4 mg total) by mouth daily as needed for nausea or vomiting., Disp: 30 tablet, Rfl: 1   predniSONE  (DELTASONE ) 10 MG tablet, Take 6 tabs (60 mg) PO x 3 days, then take 4 tabs (40 mg) PO x 3 days, then take 2 tabs (20 mg) PO x 3 days, then take 1 tab (10 mg) PO x 3 days, then take 1/2 tab (5 mg) PO x 4 days., Disp: 41 tablet, Rfl: 0   sacubitril -valsartan  (ENTRESTO ) 97-103 MG, TAKE ONE (1) TABLET BY MOUTH TWO (2) (TWO) TIMES DAILY., Disp: 180 tablet, Rfl: 3   torsemide  (DEMADEX ) 20 MG tablet, TAKE (1) TABLET BY MOUTH EVERY DAY, Disp: 90 tablet, Rfl: 2   amLODipine  (NORVASC ) 5 MG tablet, Take 1 tablet (5 mg total) by mouth daily. Stop amlodipine  after starting Coreg  and monitor BP.  Follow with PCP to titrate medications accordingly, Disp: 30 tablet, Rfl: 0   colchicine  0.6 MG tablet, Take 0.5 tablets (0.3 mg total) by mouth daily for 8 days., Disp: 4 tablet, Rfl: 0   levothyroxine  (SYNTHROID ) 125 MCG tablet, TAKE ONE (1) TABLET BY MOUTH DAILY BEFORE BREAKFAST. (Patient not taking: Reported on 01/14/2024), Disp: 90 tablet, Rfl: 1   pantoprazole  (PROTONIX ) 40 MG tablet, Take 1 tablet (40 mg total) by mouth daily., Disp: 30 tablet, Rfl: 0   PARoxetine  (PAXIL ) 40 MG tablet, TAKE (1) TABLET BY MOUTH EVERY DAY (Patient not taking: Reported on 01/14/2024), Disp: 90 tablet, Rfl: 0  Current Facility-Administered Medications:    mometasone -formoterol  (DULERA ) 200-5 MCG/ACT inhaler 2 puff, 2 puff, Inhalation, BID, Shataya Winkles, PA-C  Patient Active Problem List   Diagnosis Date Noted   Sepsis (HCC) 09/28/2023   Acute pyelonephritis 09/28/2023   Acute gout of left knee 02/17/2023   Anemia in chronic kidney disease 01/16/2023  Benign hypertensive kidney disease with chronic kidney disease 01/16/2023   Hyperkalemia 01/16/2023   Acute kidney failure 01/16/2023   Chronic kidney disease, stage  3b (HCC) 01/16/2023   Demand ischemia (HCC) 12/25/2022   Elevated troponin 12/25/2022   NSTEMI (non-ST elevated myocardial infarction) (HCC) 12/24/2022   Acute renal failure with acute tubular necrosis superimposed on stage 3b chronic kidney disease (HCC) 12/24/2022   Family history of colon cancer 10/30/2022   Adenomatous polyp of colon 10/30/2022   Generalized abdominal pain 10/30/2022   Elevated hemoglobin 05/28/2022   History of pancreatitis 05/28/2022   Lack of access to transportation 12/27/2021   Arthritis 09/04/2021   Chronic combined systolic (congestive) and diastolic (congestive) heart failure (HCC) 09/04/2021   Alcoholic pancreatitis 09/04/2021   Stage 3b chronic kidney disease (HCC) 02/18/2020   Anxiety with depression 02/18/2020   Tobacco dependence 02/18/2020   Rhinosinusitis 09/02/2019   COPD with acute exacerbation (HCC) 09/02/2019   Squamous cell carcinoma of back 04/20/2019   Accelerated hypertension 11/13/2018   Chest pain 11/12/2018   S/P primary angioplasty with coronary stent 03/04/2018   Depression, recurrent 12/02/2017   Chronic systolic heart failure (HCC) 11/21/2017   Controlled substance agreement signed 09/07/2015   Coronary artery disease involving native coronary artery    Allergic rhinitis due to pollen 06/28/2015   GAD (generalized anxiety disorder) 03/17/2015   Degenerative disc disease, cervical 03/17/2015   Hyperlipidemia    Obesity    CKD (chronic kidney disease), stage IV (HCC)    Ischemic cardiomyopathy    Hypothyroidism    History of acute anterior wall MI 02/12/2012   Essential hypertension, malignant 02/12/2012   Tobacco abuse 02/12/2012    Past Surgical History:  Procedure Laterality Date   ABDOMINAL HYSTERECTOMY     BIOPSY  10/30/2022   Procedure: BIOPSY;  Surgeon: Therisa Bi, MD;  Location: [facility] ENDOSCOPY;  Service: Gastroenterology;;   CARDIAC CATHETERIZATION  2013   [facility] s/p stent   CARDIAC CATHETERIZATION Left 08/03/2015    Procedure: Left Heart Cath and Coronary Angiography;  Surgeon: Deatrice DELENA Cage, MD;  Location: [facility] INVASIVE CV LAB;  Service: Cardiovascular;  Laterality: Left;   COLONOSCOPY WITH PROPOFOL  N/A 10/30/2022   Procedure: COLONOSCOPY WITH PROPOFOL ;  Surgeon: Therisa Bi, MD;  Location: [facility] ENDOSCOPY;  Service: Gastroenterology;  Laterality: N/A;   DILATION AND CURETTAGE OF UTERUS     ESOPHAGOGASTRODUODENOSCOPY (EGD) WITH PROPOFOL  N/A 10/30/2022   Procedure: ESOPHAGOGASTRODUODENOSCOPY (EGD) WITH PROPOFOL ;  Surgeon: Therisa Bi, MD;  Location: [facility] ENDOSCOPY;  Service: Gastroenterology;  Laterality: N/A;   LEFT HEART CATHETERIZATION WITH CORONARY ANGIOGRAM N/A 02/11/2012   Procedure: LEFT HEART CATHETERIZATION WITH CORONARY ANGIOGRAM;  Surgeon: Ozell Fell, MD;  Location: [facility] CATH LAB;  Service: Cardiovascular;  Laterality: N/A;   POLYPECTOMY  10/30/2022   Procedure: POLYPECTOMY;  Surgeon: Therisa Bi, MD;  Location: [facility] ENDOSCOPY;  Service: Gastroenterology;;   RENAL INTERVENTION N/A 11/17/2018   Procedure: RENAL INTERVENTION;  Surgeon: Jama Cordella MATSU, MD;  Location: [facility] INVASIVE CV LAB;  Service: Cardiovascular;  Laterality: N/A;    Family History  Problem Relation Age of Onset   Hypertension Mother    Hyperlipidemia Mother    Diabetes Mother    Kidney disease Mother    Hypertension Father    Thyroid  disease Father    AAA (abdominal aortic aneurysm) Father    Hypertension Sister    Hypertension Sister    Supraventricular tachycardia Sister    Hypertension Brother    Hypertension Brother  Cancer Paternal Grandfather        lung   Hypertension Daughter     Social History   Tobacco Use   Smoking status: Every Day    Current packs/day: 0.25    Average packs/day: 0.3 packs/day for 30.0 years (7.5 ttl pk-yrs)    Types: Cigarettes   Smokeless tobacco: Never  Vaping Use   Vaping status: Never Used  Substance Use Topics   Alcohol use: Not Currently    Comment: Says she  previously drank socially - never a heavy drinker.  No etoh since 08/2018.   Drug use: No    Types: Marijuana     No Known Allergies  Health Maintenance  Topic Date Due   Hepatitis B Vaccines 19-59 Average Risk (1 of 3 - 19+ 3-dose series) Never done   Cervical Cancer Screening (HPV/Pap Cotest)  Never done   DTaP/Tdap/Td (1 - Tdap) 02/17/2024 (Originally 08/15/1989)   Influenza Vaccine  07/13/2024 (Originally 11/14/2023)   Pneumococcal Vaccine: 50+ Years (1 of 2 - PCV) 01/13/2025 (Originally 08/15/1989)   Colonoscopy  10/29/2029   Hepatitis C Screening  Completed   HIV Screening  Completed   HPV VACCINES  Aged Out   Meningococcal B Vaccine  Aged Out   COVID-19 Vaccine  Discontinued   Zoster Vaccines- Shingrix  Discontinued    Chart Review Today: I personally reviewed active problem list, medication list, allergies, family history, social history, health maintenance, notes from last encounter, lab results, imaging with the patient/caregiver today.   Review of Systems  Constitutional: Negative.   HENT: Negative.    Eyes: Negative.   Respiratory: Negative.    Cardiovascular: Negative.   Gastrointestinal: Negative.   Endocrine: Negative.   Genitourinary: Negative.   Musculoskeletal: Negative.   Skin: Negative.   Allergic/Immunologic: Negative.   Neurological: Negative.   Hematological: Negative.   Psychiatric/Behavioral: Negative.    All other systems reviewed and are negative.    Objective:   Vitals:   01/14/24 1417  BP: 110/74  Pulse: 88  Resp: 16  SpO2: 100%  Weight: 175 lb 8 oz (79.6 kg)  Height: 5' 7 (1.702 m)    Body mass index is 27.49 kg/m.  Physical Exam Vitals and nursing note reviewed.  Constitutional:      General: She is not in acute distress.    Appearance: Normal appearance. She is well-developed. She is not ill-appearing, toxic-appearing or diaphoretic.  HENT:     Head: Normocephalic and atraumatic.     Right Ear: External ear normal.     Left  Ear: External ear normal.     Nose: Nose normal.  Eyes:     General: No scleral icterus.       Right eye: No discharge.        Left eye: No discharge.     Conjunctiva/sclera: Conjunctivae normal.  Neck:     Trachea: No tracheal deviation.  Cardiovascular:     Rate and Rhythm: Normal rate.  Pulmonary:     Effort: Pulmonary effort is normal. No respiratory distress.     Breath sounds: No stridor.  Musculoskeletal:     Right knee: Swelling present. No erythema. Decreased range of motion.  Skin:    General: Skin is warm and dry.     Findings: No rash.  Neurological:     Mental Status: She is alert.     Motor: No abnormal muscle tone.     Coordination: Coordination normal.     Gait: Gait normal.  Psychiatric:        Attention and Perception: Attention and perception normal.        Mood and Affect: Mood is anxious and depressed. Affect is tearful.        Speech: Speech normal.        Behavior: Behavior normal. Behavior is cooperative.        Thought Content: Thought content normal. Thought content does not include homicidal or suicidal ideation. Thought content does not include homicidal or suicidal plan.        Cognition and Memory: Memory normal.      Functional Status Survey:   Results for orders placed or performed in visit on 01/14/24  POCT urinalysis dipstick   Collection Time: 01/14/24  2:23 PM  Result Value Ref Range   Color, UA Yellow    Clarity, UA Cloudy    Glucose, UA Negative Negative   Bilirubin, UA Negative    Ketones, UA Negative    Spec Grav, UA 1.010 1.010 - 1.025   Blood, UA Negative    pH, UA 6.0 5.0 - 8.0   Protein, UA Negative Negative   Urobilinogen, UA 0.2 0.2 or 1.0 E.U./dL   Nitrite, UA Negative    Leukocytes, UA Negative Negative   Appearance YELLOW    Odor normal       Assessment & Plan:   Assessment & Plan  GAD (generalized anxiety disorder) Assessment & Plan: Acute exacerbation of anxiety sx due to increased stressors and running  out of her daily medications, paxil  40 mg which she has been on long term (many years) Discussed regretful withdrawal sx and worsening of sx which hope will improve with getting back on medications.  She can continue to try buspar  and hydroxyzine  - however with severity of sx and limited finances to pay for med refills she anticipates not getting those now while focusing on other higher priority and more effective/and needed meds Klonopin  helpful in the past, she understands its a benzo, controlled substance and can be sedating, no driving or operating machinery, hopefully she can take BID for a day or two to blunt her severe anxiety/panic sx and then start to use daily prn and then sparingly after 5-7 days.  Discussed limited supply and reserving some for severe sx.  Currently the medication is likely to provide more benefit than potential for harm, and without stronger medications I worry her sx may harm her with her emotional distress.  Psych resources discussed and offered. Pt feels comfortable with current plan to prioritize getting paxil  refilled and restarted, klonopin  use now decrease use as soon as able in next few days to week, and then other rescue meds and psych resources hopefully will be helpful once back on paxil .   Phq reviewed and highly positive with severe depressive sx, no SI, HI, AVH, GAD 7 also highly positive with severe anxiety sx.  Orders: -     PARoxetine  HCl; TAKE (1) TABLET BY MOUTH EVERY DAY  Dispense: 90 tablet; Refill: 3 -     clonazePAM ; Take 1 tablet (0.5 mg total) by mouth 2 (two) times daily as needed for anxiety (use sparingly).  Dispense: 20 tablet; Refill: 0  Other specified hypothyroidism Assessment & Plan: Hypothyroidism Thyroid  function well-managed on current levothyroxine  dose as of last check in December. - Plan to recheck TSH in 1.5 to 2 months, after she gets back on meds daily - would expect labs today to be abnormal  Lab Results  Component Value Date  TSH 1.08 03/20/2023     Orders: -     Levothyroxine  Sodium; Take 1 tablet (125 mcg total) by mouth daily before breakfast.  Dispense: 90 tablet; Refill: 3  Coronary artery disease involving native coronary artery of native heart without angina pectoris Assessment & Plan: Per cardiology, recent routine f/up march 2025 cardiology recommended continued aggressive tx with prior left main stent, long term ASA plavix  and statin BP acceptable today, no current CP  Recurrent major depressive disorder (HCC) Assessment & Plan: See below -  Phq positive, resume paxil   Orders: -     PARoxetine  HCl; TAKE (1) TABLET BY MOUTH EVERY DAY  Dispense: 90 tablet; Refill: 3  Chronic neck pain Assessment & Plan: refill on chronic meds (gabapentin )   Acute gout of left knee, unspecified cause - see below for plan -     Colchicine ; Take 2 tabs for initial dose, then take 1 tab daily until pain subsides  Dispense: 20 tablet; Refill: 5 -     Basic Metabolic Panel Without GFR; Future -     Uric acid; Future  Chronic gout of multiple sites, unspecified cause Assessment & Plan:  >>ASSESSMENT AND PLAN FOR CHRONIC GOUT OF MULTIPLE SITES WRITTEN ON 01/14/2024 10:53 PM BY Jacqueli Pangallo, PA-C  Multiple flares over the past couple years, recent admission June with flare started on low dose allopurinol  (renal dosing) with recent increase to 100 mg which she states she tolerated, but did not note an improvement in her sx, unfortunately she was recommended to f/up with PCP after admission for recheck and management of labs and meds      Lab Results  Component Value Date    LABURIC 14.4 (H) 09/29/2023    LABURIC 7.8 (H) 02/27/2018    LABURIC 10.1 (H) 08/20/2017    Continue allopurinol  same dose until recheck of all labs (2 weeks)  Likely needs referral to specialists given severity of sx and elevation of labs  Continue Low purine diet, avoid food and drink triggers She just completed long steroid taper If  needing colchicine  hold statin and she hold carvedilol  due to past SE when taking - instructed pt to discuss plan with cardiology   Orders: -     Allopurinol ; Take 1 tablet (100 mg total) by mouth daily.  Dispense: 90 tablet; Refill: 1 -     Colchicine ; Take 2 tabs for initial dose, then take 1 tab daily until pain subsides  Dispense: 20 tablet; Refill: 5 -     Basic Metabolic Panel; Future -     Uric acid; Future  Mixed hyperlipidemia Assessment & Plan: Pt has discontinued cholesterol medication for a month due to financial constraints. - Ensure cholesterol medication is refilled and taken regularly. Lab Results  Component Value Date   CHOL 136 03/20/2023   HDL 48 (L) 03/20/2023   LDLCALC 65 03/20/2023   TRIG 155 (H) 03/20/2023   CHOLHDL 2.8 03/20/2023      Encounter for examination following treatment at hospital Hospitalized in June 2025 lost to f/up, recent ED visit 9/15 reviewed Pyelo, Aki, sepsis, gout flare, reviewed multiple notes, labs/results  -     Basic Metabolic Panel Without GFR; Future -     Uric acid; Future -     Urine Culture  Urinary tract infection without hematuria, site unspecified She is concerned with back pain due to prior pyelo/sepsis/admission in June, urine dip today reviewed and unremarkable, no urinary fq, urgency, hematuria, reviewed options to start tx with  empiric abx and then follow and adjust based on C&S, vs wait for C&S then treat if needed - she wishes to wait for culture -    Urine Culture  Bilateral low back pain, unspecified chronicity, unspecified whether sciatica present - see plan above -     POCT urinalysis dipstick -     Urine Culture     Anxiety and Depression (additional AI A&P notes - add to above) Severe anxiety and emotional instability due to discontinuation of Paxil  for two weeks, exacerbated by personal stressors. Financial constraints have led to medication non-adherence. Klonopin  has been effective for acute anxiety  management in the past. - Prescribe clonazepam  0.5 mg for acute anxiety management, use sparingly. - Ensure Paxil  40 mg is refilled and taken regularly. - Advise against driving or taking other sedating medications with clonazepam . - Encourage finding a therapist for additional support.       Recording duration: 18 minutes      Return for 1 month f/up on mood, meds, gout/uric acid.   Michelene Cower, PA-C 01/14/24 2:34 PM

## 2024-01-14 NOTE — Assessment & Plan Note (Signed)
 Per cardiology, recent routine f/up march 2025 cardiology recommended continued aggressive tx with prior left main stent, long term ASA plavix  and statin

## 2024-01-14 NOTE — Patient Instructions (Signed)
 Walk-in lab hours: Any weekday and office is open 8:00 -11:30 am or 1:30 -3:30 pm   Return in 2 weeks for labs

## 2024-01-14 NOTE — Assessment & Plan Note (Signed)
 Lab Results  Component Value Date   EGFR 51 (L) 03/20/2023   EGFR 21 (L) 11/15/2022   EGFR 42 (L) 05/28/2022   EGFR 55 (L) 12/27/2021   EGFR 39 (L) 01/31/2021   Recheck renal function with labs - she declines to do today but will do in the next ~2 weeks

## 2024-01-14 NOTE — Assessment & Plan Note (Signed)
 Pt has discontinued cholesterol medication for a month due to financial constraints. - Ensure cholesterol medication is refilled and taken regularly. Lab Results  Component Value Date   CHOL 136 03/20/2023   HDL 48 (L) 03/20/2023   LDLCALC 65 03/20/2023   TRIG 155 (H) 03/20/2023   CHOLHDL 2.8 03/20/2023

## 2024-01-14 NOTE — Assessment & Plan Note (Signed)
 Hypothyroidism Thyroid  function well-managed on current levothyroxine  dose as of last check in December. - Plan to recheck TSH in 1.5 to 2 months, after she gets back on meds daily - would expect labs today to be abnormal  Lab Results  Component Value Date   TSH 1.08 03/20/2023

## 2024-01-14 NOTE — Assessment & Plan Note (Signed)
 refill on chronic meds (gabapentin )

## 2024-01-14 NOTE — Assessment & Plan Note (Addendum)
>>  ASSESSMENT AND PLAN FOR CHRONIC GOUT OF MULTIPLE SITES WRITTEN ON 01/14/2024 10:53 PM BY Sharlotte Baka, PA-C  Multiple flares over the past couple years, recent admission June with flare started on low dose allopurinol  (renal dosing) with recent increase to 100 mg which she states she tolerated, but did not note an improvement in her sx, unfortunately she was recommended to f/up with PCP after admission for recheck and management of labs and meds      Lab Results  Component Value Date    LABURIC 14.4 (H) 09/29/2023    LABURIC 7.8 (H) 02/27/2018    LABURIC 10.1 (H) 08/20/2017    Continue allopurinol  same dose until recheck of all labs Likely needs referral to specialists given severity of sx and elevation of labs  Low purine diet If needing colchicine  hold statin and she hold carvedilol  due to past SE when taking - instructed pt to discuss plan with cardiology

## 2024-01-14 NOTE — Assessment & Plan Note (Signed)
 See below -  Phq positive, resume paxil 

## 2024-01-15 LAB — URINE CULTURE
MICRO NUMBER:: 17043003
Result:: NO GROWTH
SPECIMEN QUALITY:: ADEQUATE

## 2024-01-15 NOTE — Telephone Encounter (Signed)
 Requested Prescriptions  Refused Prescriptions Disp Refills   famotidine  (PEPCID ) 20 MG tablet [Pharmacy Med Name: FAMOTIDINE  20MG  TABLET] 60 tablet 2    Sig: TAKE (1) TABLET BY MOUTH TWICE DAILY AS NEEDED FOR HEARTBURN OR INDIGESTION.     Gastroenterology:  H2 Antagonists Passed - 01/15/2024  5:25 PM      Passed - Valid encounter within last 12 months    Recent Outpatient Visits           Yesterday GAD (generalized anxiety disorder)   [facility] Leavy Mole, PA-C   6 months ago Other secondary acute gout of right wrist   [facility] Gareth Mliss FALCON, FNP               atorvastatin  (LIPITOR ) 80 MG tablet [Pharmacy Med Name: ATORVASTATIN  CALCIUM  80MG  TABLET] 90 tablet 0    Sig: TAKE (1) TABLET BY MOUTH AT BEDTIME     Cardiovascular:  Antilipid - Statins Failed - 01/15/2024  5:25 PM      Failed - Lipid Panel in normal range within the last 12 months    Cholesterol, Total  Date Value Ref Range Status  02/27/2018 136 100 - 199 mg/dL Final   Cholesterol  Date Value Ref Range Status  03/20/2023 136 <200 mg/dL Final   LDL Cholesterol (Calc)  Date Value Ref Range Status  03/20/2023 65 mg/dL (calc) Final    Comment:    Reference range: <100 . Desirable range <100 mg/dL for primary prevention;   <70 mg/dL for patients with CHD or diabetic patients  with > or = 2 CHD risk factors. SABRA LDL-C is now calculated using the Martin-Hopkins  calculation, which is a validated novel method providing  better accuracy than the Friedewald equation in the  estimation of LDL-C.  Gladis APPLETHWAITE et al. SANDREA. 7986;689(80): 2061-2068  (http://education.[facility].com/faq/FAQ164)    HDL  Date Value Ref Range Status  03/20/2023 48 (L) > OR = 50 mg/dL Final  88/84/7980 53 >60 mg/dL Final   Triglycerides  Date Value Ref Range Status  03/20/2023 155 (H) <150 mg/dL Final         Passed - Patient is not pregnant      Passed - Valid  encounter within last 12 months    Recent Outpatient Visits           Yesterday GAD (generalized anxiety disorder)   [facility] Leavy Mole, PA-C   6 months ago Other secondary acute gout of right wrist   [facility] Gareth Mliss FALCON, [place]

## 2024-01-16 ENCOUNTER — Ambulatory Visit: Payer: Self-pay | Admitting: Family Medicine

## 2024-01-25 DIAGNOSIS — Z419 Encounter for procedure for purposes other than remedying health state, unspecified: Secondary | ICD-10-CM | POA: Diagnosis not present

## 2024-02-16 ENCOUNTER — Ambulatory Visit: Admitting: Physician Assistant

## 2024-02-16 ENCOUNTER — Ambulatory Visit: Admitting: Nurse Practitioner

## 2024-02-17 ENCOUNTER — Encounter: Payer: Self-pay | Admitting: Nurse Practitioner

## 2024-02-17 ENCOUNTER — Ambulatory Visit (INDEPENDENT_AMBULATORY_CARE_PROVIDER_SITE_OTHER): Admitting: Nurse Practitioner

## 2024-02-17 VITALS — BP 155/85 | HR 83 | Temp 98.1°F | Resp 18 | Ht 67.0 in | Wt 182.7 lb

## 2024-02-17 DIAGNOSIS — M1A09X0 Idiopathic chronic gout, multiple sites, without tophus (tophi): Secondary | ICD-10-CM

## 2024-02-17 DIAGNOSIS — N1832 Chronic kidney disease, stage 3b: Secondary | ICD-10-CM

## 2024-02-17 DIAGNOSIS — E038 Other specified hypothyroidism: Secondary | ICD-10-CM

## 2024-02-17 DIAGNOSIS — F411 Generalized anxiety disorder: Secondary | ICD-10-CM | POA: Diagnosis not present

## 2024-02-17 DIAGNOSIS — F339 Major depressive disorder, recurrent, unspecified: Secondary | ICD-10-CM

## 2024-02-17 MED ORDER — CLONAZEPAM 0.5 MG PO TABS: 0.5000 mg | ORAL_TABLET | Freq: Two times a day (BID) | ORAL | 0 refills | Status: DC | PRN

## 2024-02-17 NOTE — Progress Notes (Signed)
 BP (!) 155/85   Pulse 83   Temp 98.1 F (36.7 C)   Resp 18   Ht 5' 7 (1.702 m)   Wt 182 lb 11.2 oz (82.9 kg)   SpO2 96%   BMI 28.61 kg/m    Subjective:    Patient ID: Angelica Herrera, female    DOB: 1971/03/20, 53 y.o.   MRN: [id number]  HPI: Angelica Herrera is a 53 y.o. female  Chief Complaint  Patient presents with   Medical Management of Chronic Issues   Medication Refill   Discussed the use of AI scribe software for clinical note transcription with the patient, who gave verbal consent to proceed.  History of Present Illness Angelica Herrera is a 53 year old female with depression, anxiety, gout, and kidney disease who presents for a routine follow-up.  Mood disturbance and anxiety - Depression and anxiety with severe anxiety limiting ability to go out in public and drive - Inadequate sleep exacerbating mental health symptoms - Paroxetine  40 mg daily, Klonopin  0.5 mg as needed up to twice daily, and Buspar  5 mg, one to three tablets twice daily as needed for anxiety - No driving for six to seven years due to anxiety  Gout and renal dysfunction - Gout managed with allopurinol  100 mg daily and colchicine  as needed for flares - No gout attacks since increasing allopurinol  dosage - Pending uric acid and kidney function tests; last GFR was 37, uric acid level was 14.4  Thyroid  dysfunction - Levothyroxine  125 micrograms daily for thyroid  disorder - Thyroid  function has not been checked recently  Hypertension and headache - Entresto  97-103 mg twice daily, torsemide  20 mg every other day, and carvedilol  12.5 mg twice daily for blood pressure management - Missed a couple of night doses recently - Headache today attributed to elevated blood pressure         02/17/2024   10:42 AM 01/14/2024    2:04 PM 03/20/2023   10:21 AM  Depression screen PHQ 2/9  Decreased Interest 3 3 0  Down, Depressed, Hopeless 2 3 0  PHQ - 2 Score 5 6 0  Altered sleeping 3 3    Tired, decreased energy 3 3   Change in appetite 3 3   Feeling bad or failure about yourself  3 0   Trouble concentrating 3 3   Moving slowly or fidgety/restless 0 0   Suicidal thoughts 0 0   PHQ-9 Score 20 18   Difficult doing work/chores Very difficult Very difficult        02/17/2024   10:43 AM 01/14/2024    2:17 PM 07/30/2022    9:12 AM 01/10/2022    2:05 PM  GAD 7 : Generalized Anxiety Score  Nervous, Anxious, on Edge 3 3 3 2   Control/stop worrying 3 3 3 3   Worry too much - different things 3 3 3 3   Trouble relaxing 2 3 3 2   Restless 0 3 3 2   Easily annoyed or irritable 3 3 3 3   Afraid - awful might happen 3 3 1 2   Total GAD 7 Score 17 21 19 17   Anxiety Difficulty Very difficult Extremely difficult Extremely difficult Somewhat difficult     Relevant past medical, surgical, family and social history reviewed and updated as indicated. Interim medical history since our last visit reviewed. Allergies and medications reviewed and updated.  Review of Systems  Constitutional: Negative for fever or weight change.  Respiratory: Negative for cough and shortness of  breath.   Cardiovascular: Negative for chest pain or palpitations.  Gastrointestinal: Negative for abdominal pain, no bowel changes.  Musculoskeletal: Negative for gait problem or joint swelling.  Skin: Negative for rash.  Neurological: Negative for dizziness or headache.  No other specific complaints in a complete review of systems (except as listed in HPI above).      Objective:      BP (!) 155/85   Pulse 83   Temp 98.1 F (36.7 C)   Resp 18   Ht 5' 7 (1.702 m)   Wt 182 lb 11.2 oz (82.9 kg)   SpO2 96%   BMI 28.61 kg/m    Wt Readings from Last 3 Encounters:  02/17/24 182 lb 11.2 oz (82.9 kg)  01/14/24 175 lb 8 oz (79.6 kg)  12/28/23 160 lb 15 oz (73 kg)    Physical Exam VITALS: BP- 192/124 GENERAL: Alert, cooperative, well developed, no acute distress HEENT: Normocephalic, normal oropharynx, moist  mucous membranes CHEST: Clear to auscultation bilaterally, No wheezes, rhonchi, or crackles CARDIOVASCULAR: Normal heart rate and rhythm, S1 and S2 normal without murmurs ABDOMEN: Soft, non-tender, non-distended, without organomegaly, Normal bowel sounds EXTREMITIES: No cyanosis or edema NEUROLOGICAL: Cranial nerves grossly intact, Moves all extremities without gross motor or sensory deficit  Results for orders placed or performed in visit on 01/14/24  POCT urinalysis dipstick   Collection Time: 01/14/24  2:23 PM  Result Value Ref Range   Color, UA Yellow    Clarity, UA Cloudy    Glucose, UA Negative Negative   Bilirubin, UA Negative    Ketones, UA Negative    Spec Grav, UA 1.010 1.010 - 1.025   Blood, UA Negative    pH, UA 6.0 5.0 - 8.0   Protein, UA Negative Negative   Urobilinogen, UA 0.2 0.2 or 1.0 E.U./dL   Nitrite, UA Negative    Leukocytes, UA Negative Negative   Appearance YELLOW    Odor normal   Urine Culture   Collection Time: 01/14/24  3:29 PM   Specimen: Urine  Result Value Ref Range   MICRO NUMBER: [id number]    SPECIMEN QUALITY: Adequate    Sample Source URINE    STATUS: FINAL    Result: No Growth           Assessment & Plan:   Problem List Items Addressed This Visit       Endocrine   Hypothyroidism   Relevant Orders   TSH     Musculoskeletal and Integument   Chronic gout of multiple sites   Relevant Orders   Uric acid     Genitourinary   Stage 3b chronic kidney disease (HCC)   Relevant Orders   Basic Metabolic Panel Without GFR     Other   GAD (generalized anxiety disorder) - Primary   Relevant Medications   clonazePAM  (KLONOPIN ) 0.5 MG tablet   Depression, recurrent     Assessment and Plan Assessment & Plan Generalized anxiety disorder and major depressive disorder Chronic anxiety and depression with ongoing symptoms including difficulty sleeping, anxiety in public, and depression. Currently managed with paroxetine  40 mg daily,  Klonopin  0.5 mg as needed (max two times per day), and Buspar  5 mg as needed (1-3 tablets twice daily). - Continue paroxetine  40 mg daily - Continue Klonopin  0.5 mg as needed, max two times per day - Continue Buspar  5 mg as needed, 1-3 tablets twice daily - Refilled Klonopin  prescription  Chronic kidney disease, stage 3b Chronic kidney disease stage 3b  with last GFR of 37. Blood pressure is elevated at 192/124. - Rechecked blood pressure before leaving - Ordered BMP to assess kidney function  Chronic gout Recent increase in allopurinol  to 100 mg daily. No recent gout flares reported since medication adjustment. - Ordered uric acid level to assess response to allopurinol  - Continue allopurinol  100 mg daily - Continue colchicine  as needed for gout flares  Other specified hypothyroidism Hypothyroidism managed with levothyroxine  125 mcg daily. Thyroid  function has not been checked recently. - Ordered thyroid  function tests  Hypertension Elevated blood pressure at 192/124. Currently on Entresto , torsemide , and carvedilol . Reports missing some doses due to time changes. - Rechecked blood pressure before leaving - Continue current antihypertensive regimen        Follow up plan: Return in about 6 months (around 08/16/2024) for follow up.

## 2024-02-18 ENCOUNTER — Ambulatory Visit: Admitting: Family Medicine

## 2024-02-18 ENCOUNTER — Ambulatory Visit: Payer: Self-pay | Admitting: Nurse Practitioner

## 2024-02-18 ENCOUNTER — Other Ambulatory Visit: Payer: Self-pay | Admitting: Nurse Practitioner

## 2024-02-18 DIAGNOSIS — N1832 Chronic kidney disease, stage 3b: Secondary | ICD-10-CM

## 2024-02-18 LAB — BASIC METABOLIC PANEL WITHOUT GFR
BUN/Creatinine Ratio: 16 (calc) (ref 6–22)
BUN: 19 mg/dL (ref 7–25)
CO2: 23 mmol/L (ref 20–32)
Calcium: 9.5 mg/dL (ref 8.6–10.4)
Chloride: 109 mmol/L (ref 98–110)
Creat: 1.18 mg/dL — ABNORMAL HIGH (ref 0.50–1.03)
Glucose, Bld: 94 mg/dL (ref 65–99)
Potassium: 4.2 mmol/L (ref 3.5–5.3)
Sodium: 141 mmol/L (ref 135–146)

## 2024-02-19 LAB — COMPREHENSIVE METABOLIC PANEL WITH GFR
AG Ratio: 2 (calc) (ref 1.0–2.5)
ALT: 19 U/L (ref 6–29)
AST: 18 U/L (ref 10–35)
Albumin: 4.3 g/dL (ref 3.6–5.1)
Alkaline phosphatase (APISO): 96 U/L (ref 37–153)
BUN/Creatinine Ratio: 16 (calc) (ref 6–22)
BUN: 19 mg/dL (ref 7–25)
CO2: 23 mmol/L (ref 20–32)
Calcium: 9.5 mg/dL (ref 8.6–10.4)
Chloride: 109 mmol/L (ref 98–110)
Creat: 1.18 mg/dL — ABNORMAL HIGH (ref 0.50–1.03)
Globulin: 2.2 g/dL (ref 1.9–3.7)
Glucose, Bld: 94 mg/dL (ref 65–99)
Potassium: 4.2 mmol/L (ref 3.5–5.3)
Sodium: 141 mmol/L (ref 135–146)
Total Bilirubin: 0.5 mg/dL (ref 0.2–1.2)
Total Protein: 6.5 g/dL (ref 6.1–8.1)
eGFR: 55 mL/min/1.73m2 — ABNORMAL LOW (ref >=60)

## 2024-02-19 LAB — URIC ACID: Uric Acid, Serum: 6.4 mg/dL (ref 2.5–7.0)

## 2024-02-19 LAB — TEST AUTHORIZATION

## 2024-02-19 LAB — TSH: TSH: 2.92 m[IU]/L

## 2024-02-23 NOTE — Progress Notes (Deleted)
 Cardiology Office Note    Date:  02/23/2024   ID:  Angelica Herrera, DOB 02-May-1970, MRN [id number]  PCP:  Angelica Mole, PA-C  Cardiologist:  Deatrice Cage, MD  Electrophysiologist:  None   Chief Complaint: Follow-up  History of Present Illness:   Angelica Herrera is a 53 y.o. female with history of CAD with anterolateral ST elevation MI in 01/2012 with PTCA/DES to the left main at that time as detailed below complicated by hemodynamic instability and VT, HFrEF secondary to ICM, mitral and tricuspid regurgitation, left renal artery stenosis status post left renal artery stenting in 11/2018 by vascular surgery, CKD stage III, HLD, hypothyroidism, depression, and tobacco use who presents for follow-up of her CAD and cardiomyopathy.   She was admitted to the hospital in 01/2012 with an anterolateral STEMI. She underwent emergent cardiac catheterization which showed occlusion of the left main coronary artery. There was significant difficulty engaging the left main, though she ultimately underwent successful PCI/DES. She also had PTCA done to the LAD due to embolization from the left main. Post procedure she had significant hemodynamic instablity with VT and hypotension. She was noted to have severe MR initially, however subsequent echocardiogram showed an EF of 45% with no significant MR. She was lost to follow up from 2013-2017. Upon re-establishing with our group in 2017, she continued to smoke and had been off her medications. In 2017, she had atypical chest pain with a Lexiscan  MPI showing prior anterior infarct with mild peri-infarct ischemia and an EF of 33%. She underwent repeat cardiac cath at that time which showed a patent left main stent with moderate mid LAD stenosis and an occluded D2 with faint collaterals. The RCA was very large with mild mid stenosis. EF was 35-40% with anterior wall hypokinesis. Continued medical therapy was advised. She underwent repeat nuclear stress testing in  02/2017 for recurrent chest pain that showed a similar finding of prior anterior infarct with mild peri-infarct ischemia.  Echo at that time demonstrated an EF of 30 to 35%, diffuse hypokinesis with severe hypokinesis/possible akinesis of the anterior and lateral walls, grade 1 diastolic dysfunction, mild to moderate mitral regurgitation, normal RV systolic function, and mildly elevated PASP.  Most recent echo from 02/2019 demonstrated an EF of 35 to 40%, anterior and septal wall hypokinesis, grade 2 diastolic dysfunction, mildly dilated LV internal cavity size, normal RV systolic function with mildly enlarged ventricular cavity, mildly to moderately dilated left atrium, mildly dilated right atrium, moderately elevated PASP estimated at 51.2 mmHg.  In follow-up visits she has required titration of GDMT and diuretic therapy.   She was admitted to the hospital in 12/2022 with pancreatitis complicated by AKI and demand ischemia with a high-sensitivity troponin peaking at 75.  Echo during the admission showed an EF of 35 to 40%, global hypokinesis with severe hypokinesis of the entire anterior and anteroseptal wall, mild LVH, grade 2 diastolic dysfunction, low normal RV systolic function with normal ventricular cavity size, moderate biatrial enlargement, moderate to severe mitral regurgitation, and moderate tricuspid regurgitation.  Renal artery ultrasound with upper limit of normal arterial velocities in the right renal artery suggestive of stenosis approaching 50 to 60% and slightly elevated velocities noted in the mid left renal artery with known stenting of this vessel.  She was last seen in the office in 06/2023 and reported adherence to cardiac medications.  She reported drinking several beers the first 5 days of the month and otherwise abstained.  She continued to smoke  and was not ready to quit.  The hospital in 09/2023 with gout and UTI with admission further complicated by AKI and concern for pulmonary  edema due to IV fluids given for sepsis protocol, and hypokalemia.  During the admission, BNP trended from 245 to greater than 4500 to 2681.  Troponin was minimally elevated and flat trending peaking at 26.  ***   Labs independently reviewed: 02/2024 - BUN 19, serum creatinine 1.18, potassium 4.2, albumin 4.3, AST/ALT normal, TSH normal 12/2023 - Hgb 15.6, PLT 173 09/2023 - BNP 2681 (improved from > 4500), magnesium  2.7 03/2023 - A1c 6.1, TC 136, TG 155, HDL 48, LDL 65, TSH normal   Past Medical History:  Diagnosis Date   Accelerated hypertension 11/13/2018   Acute kidney failure 01/16/2023   Acute pyelonephritis 09/28/2023   Acute renal failure with acute tubular necrosis superimposed on stage 3b chronic kidney disease (HCC) 12/24/2022   Allergic rhinitis due to pollen 06/28/2015   Anxiety with depression 02/18/2020   Arthritis    gout   Chest pain 11/12/2018   Chronic combined systolic (congestive) and diastolic (congestive) heart failure (HCC)    a. 02/2017 Echo: EF 30-35%, diff HK, sev HK/poss AK of ant and lat walls. Gr1 DD. Mild to mod MR. Nl RV fxn. Mod TR.   Chronic obstructive pulmonary disease (HCC) 09/02/2019   tx with trelegy and SABA   Chronic systolic heart failure (HCC) 11/21/2017   CKD (chronic kidney disease), stage IV (HCC)    Controlled substance agreement signed 09/07/2015   Coronary artery disease involving native coronary artery    Degenerative disc disease, cervical 03/17/2015   Demand ischemia (HCC) 12/25/2022   Depression, recurrent 12/02/2017   Essential hypertension, malignant 02/12/2012   GAD (generalized anxiety disorder) 03/17/2015   History of acute anterior wall MI 02/12/2012   Hyperlipidemia    Hypothyroidism    Ischemic cardiomyopathy    a. 02/2012 Echo: EF 40-45%, ant/lat HK, Gr1 DD, Mild MR, PASP ; b. 02/2017 Echo: EF 30-35%.   NSTEMI (non-ST elevated myocardial infarction) (HCC) 12/24/2022   NSVT (nonsustained ventricular tachycardia)  (HCC)    a. 02/2012 post-MI   Obesity    Pancreatitis    Rhinosinusitis 09/02/2019   S/P primary angioplasty with coronary stent 03/04/2018   Dual antiplatelet therapy INDEFINITELY per cardiology note Nov 2019   Squamous cell carcinoma of back 04/20/2019   referrred to sugery for f/up excision, derm not available for several month, lesion with rapid change cannot wait, still see derm for skin survey etc   Stage 3b chronic kidney disease (HCC) 02/18/2020   Tobacco abuse    Tobacco dependence 02/18/2020    Past Surgical History:  Procedure Laterality Date   ABDOMINAL HYSTERECTOMY     BIOPSY  10/30/2022   Procedure: BIOPSY;  Surgeon: Therisa Bi, MD;  Location: [facility] ENDOSCOPY;  Service: Gastroenterology;;   CARDIAC CATHETERIZATION  2013   [facility] s/p stent   CARDIAC CATHETERIZATION Left 08/03/2015   Procedure: Left Heart Cath and Coronary Angiography;  Surgeon: Deatrice DELENA Cage, MD;  Location: [facility] INVASIVE CV LAB;  Service: Cardiovascular;  Laterality: Left;   COLONOSCOPY WITH PROPOFOL  N/A 10/30/2022   Procedure: COLONOSCOPY WITH PROPOFOL ;  Surgeon: Therisa Bi, MD;  Location: [facility] ENDOSCOPY;  Service: Gastroenterology;  Laterality: N/A;   DILATION AND CURETTAGE OF UTERUS     ESOPHAGOGASTRODUODENOSCOPY (EGD) WITH PROPOFOL  N/A 10/30/2022   Procedure: ESOPHAGOGASTRODUODENOSCOPY (EGD) WITH PROPOFOL ;  Surgeon: Therisa Bi, MD;  Location: [facility] ENDOSCOPY;  Service: Gastroenterology;  Laterality: N/A;   LEFT HEART CATHETERIZATION WITH CORONARY ANGIOGRAM N/A 02/11/2012   Procedure: LEFT HEART CATHETERIZATION WITH CORONARY ANGIOGRAM;  Surgeon: Ozell Fell, MD;  Location: [facility] CATH LAB;  Service: Cardiovascular;  Laterality: N/A;   POLYPECTOMY  10/30/2022   Procedure: POLYPECTOMY;  Surgeon: Therisa Bi, MD;  Location: [facility] ENDOSCOPY;  Service: Gastroenterology;;   RENAL INTERVENTION N/A 11/17/2018   Procedure: RENAL INTERVENTION;  Surgeon: Jama Cordella MATSU, MD;  Location: [facility] INVASIVE CV LAB;   Service: Cardiovascular;  Laterality: N/A;    Current Medications: No outpatient medications have been marked as taking for the 02/24/24 encounter (Appointment) with Abigail Bernardino HERO, PA-C.   Current Facility-Administered Medications for the 02/24/24 encounter (Appointment) with Abigail Bernardino HERO, PA-C  Medication   mometasone -formoterol  (DULERA ) 200-5 MCG/ACT inhaler 2 puff    Allergies:   Patient has no known allergies.   Social History   Socioeconomic History   Marital status: Legally Separated    Spouse name: Not on file   Number of children: Not on file   Years of education: Not on file   Highest education level: Not on file  Occupational History   Not on file  Tobacco Use   Smoking status: Every Day    Current packs/day: 0.25    Average packs/day: 0.3 packs/day for 30.0 years (7.5 ttl pk-yrs)    Types: Cigarettes   Smokeless tobacco: Never  Vaping Use   Vaping status: Never Used  Substance and Sexual Activity   Alcohol use: Not Currently    Comment: Says she previously drank socially - never a heavy drinker.  No etoh since 08/2018.   Drug use: No    Types: Marijuana   Sexual activity: Not on file  Other Topics Concern   Not on file  Social History Narrative   Lives in [place] w/ her dtr.  Does not routinely exercise.  Disabled/doesn't work.   Social Drivers of Corporate Investment Banker Strain: Not on file  Food Insecurity: No Food Insecurity (09/30/2023)   Hunger Vital Sign    Worried About Running Out of Food in the Last Year: Never true    Ran Out of Food in the Last Year: Never true  Transportation Needs: No Transportation Needs (09/30/2023)   PRAPARE - Administrator, Civil Service (Medical): No    Lack of Transportation (Non-Medical): No  Physical Activity: Not on file  Stress: Not on file  Social Connections: Not on file     Family History:  The patient's family history includes AAA (abdominal aortic aneurysm) in her father; Cancer in her  paternal grandfather; Diabetes in her mother; Hyperlipidemia in her mother; Hypertension in her brother, brother, daughter, father, mother, sister, and sister; Kidney disease in her mother; Supraventricular tachycardia in her sister; Thyroid  disease in her father.  ROS:   12-point review of systems is negative unless otherwise noted in the HPI.   EKGs/Labs/Other Studies Reviewed:    Studies reviewed were summarized above. The additional studies were reviewed today: ***  EKG:  EKG is ordered today.  The EKG ordered today demonstrates ***  Recent Labs: 10/02/2023: Magnesium  2.7 10/05/2023: B Natriuretic Peptide 2,681.0 12/28/2023: Hemoglobin 15.6; Platelets 173 02/17/2024: ALT 19; BUN 19; BUN 19; Creat 1.18; Creat 1.18; Potassium 4.2; Potassium 4.2; Sodium 141; Sodium 141; TSH 2.92  Recent Lipid Panel    Component Value Date/Time   CHOL 136 03/20/2023 1128   CHOL 136 02/27/2018 1207   TRIG 155 (H) 03/20/2023 1128  HDL 48 (L) 03/20/2023 1128   HDL 53 02/27/2018 1207   CHOLHDL 2.8 03/20/2023 1128   VLDL 15 11/13/2018 0628   LDLCALC 65 03/20/2023 1128    PHYSICAL EXAM:    VS:  There were no vitals taken for this visit.  BMI: There is no height or weight on file to calculate BMI.  Physical Exam  Wt Readings from Last 3 Encounters:  02/17/24 182 lb 11.2 oz (82.9 kg)  01/14/24 175 lb 8 oz (79.6 kg)  12/28/23 160 lb 15 oz (73 kg)     ASSESSMENT & PLAN:   CAD involving native coronary arteries without angina:  HFrEF secondary to ICM:  Mitral and tricuspid regurgitation:  CKD stage IIIb:  HTN with RAS: Blood pressure  HLD: LDL 65 in 03/2023 with normal AST/ALT in 02/2024.  Alcohol and tobacco use:   {Are you ordering a CV Procedure (e.g. stress test, cath, DCCV, TEE, etc)?   Press F2        :[id number]}     Disposition: F/u with Dr. Darron or an APP in ***.   Medication Adjustments/Labs and Tests Ordered: Current medicines are reviewed at length with the patient  today.  Concerns regarding medicines are outlined above. Medication changes, Labs and Tests ordered today are summarized above and listed in the Patient Instructions accessible in Encounters.   Signed, Bernardino Bring, PA-C 02/23/2024 1:26 PM     [facility] [place] [address] [place], [place] [address] [phone]

## 2024-02-24 ENCOUNTER — Ambulatory Visit: Admitting: Physician Assistant

## 2024-02-27 NOTE — Progress Notes (Signed)
 Cardiology Office Note    Date:  03/01/2024   ID:  SARAHI BORLAND, DOB [birth year], MRN [id number]  PCP:  Leavy Mole, PA-C  Cardiologist:  Deatrice Cage, MD  Electrophysiologist:  None   Chief Complaint: Follow-up  History of Present Illness:   Angelica Herrera is a 53 y.o. female with history of CAD with anterolateral ST elevation MI in 01/2012 with PTCA/DES to the left main at that time as detailed below complicated by hemodynamic instability and VT, HFrEF secondary to ICM, mitral and tricuspid regurgitation, left renal artery stenosis status post left renal artery stenting in 11/2018 by vascular surgery, CKD stage III, HLD, hypothyroidism, depression, and tobacco use who presents for follow-up of her CAD and cardiomyopathy.   She was admitted to the hospital in 01/2012 with an anterolateral STEMI. She underwent emergent cardiac catheterization which showed occlusion of the left main coronary artery. There was significant difficulty engaging the left main, though she ultimately underwent successful PCI/DES. She also had PTCA done to the LAD due to embolization from the left main. Post procedure she had significant hemodynamic instablity with VT and hypotension. She was noted to have severe MR initially, however subsequent echocardiogram showed an EF of 45% with no significant MR. She was lost to follow up from 2013-2017. Upon re-establishing with our group in 2017, she continued to smoke and had been off her medications. In 2017, she had atypical chest pain with a Lexiscan  MPI showing prior anterior infarct with mild peri-infarct ischemia and an EF of 33%. She underwent repeat cardiac cath at that time which showed a patent left main stent with moderate mid LAD stenosis and an occluded D2 with faint collaterals. The RCA was very large with mild mid stenosis. EF was 35-40% with anterior wall hypokinesis. Continued medical therapy was advised. She underwent repeat nuclear stress testing in  02/2017 for recurrent chest pain that showed a similar finding of prior anterior infarct with mild peri-infarct ischemia.  Echo at that time demonstrated an EF of 30 to 35%, diffuse hypokinesis with severe hypokinesis/possible akinesis of the anterior and lateral walls, grade 1 diastolic dysfunction, mild to moderate mitral regurgitation, normal RV systolic function, and mildly elevated PASP.  Most recent echo from 02/2019 demonstrated an EF of 35 to 40%, anterior and septal wall hypokinesis, grade 2 diastolic dysfunction, mildly dilated LV internal cavity size, normal RV systolic function with mildly enlarged ventricular cavity, mildly to moderately dilated left atrium, mildly dilated right atrium, moderately elevated PASP estimated at 51.2 mmHg.  In follow-up visits she has required titration of GDMT and diuretic therapy.   She was admitted to the hospital in 12/2022 with pancreatitis complicated by AKI and demand ischemia with a high-sensitivity troponin peaking at 75.  Echo during the admission showed an EF of 35 to 40%, global hypokinesis with severe hypokinesis of the entire anterior and anteroseptal wall, mild LVH, grade 2 diastolic dysfunction, low normal RV systolic function with normal ventricular cavity size, moderate biatrial enlargement, moderate to severe mitral regurgitation, and moderate tricuspid regurgitation.  Renal artery ultrasound with upper limit of normal arterial velocities in the right renal artery suggestive of stenosis approaching 50 to 60% and slightly elevated velocities noted in the mid left renal artery with known stenting of this vessel.  She was last seen in the office in 06/2023 and reported adherence to cardiac medications.  She reported drinking several beers the first 5 days of the month and otherwise abstained.  She continued to smoke  and was not ready to quit.  She was admitted to the hospital in 09/2023 with gout and UTI with admission further complicated by AKI and  concern for pulmonary edema due to IV fluids given for sepsis protocol, and hypokalemia.  During the admission, BNP trended from 245 to greater than 4500 to 2681.  Troponin was minimally elevated and flat trending peaking at 26.  She comes in today and is without symptoms of angina or cardiac decompensation.  She does feel like she is slightly volume up, particularly along her abdomen.  Her weight is up 6 pounds today when compared to her last visit in 06/2023.  She does not feel like she is voiding as much as she used to with torsemide .  She continues to drink two, 12 ounce Mountain Dew's daily and 5-6 bottles of water daily.  She will drink 1-2 beers several days per week.  Continues to smoke.  No lower extremity swelling.  Stable two-pillow orthopnea.  No dizziness, presyncope, or syncope.  No falls or symptoms concerning for bleeding.  Requests refill of pantoprazole  and famotidine .   Labs independently reviewed: 02/2024 - BUN 19, serum creatinine 1.18, potassium 4.2, albumin 4.3, AST/ALT normal, TSH normal 12/2023 - Hgb 15.6, PLT 173 09/2023 - BNP 2681 (improved from > 4500), magnesium  2.7 03/2023 - A1c 6.1, TC 136, TG 155, HDL 48, LDL 65, TSH normal   Past Medical History:  Diagnosis Date   Accelerated hypertension 11/13/2018   Acute kidney failure 01/16/2023   Acute pyelonephritis 09/28/2023   Acute renal failure with acute tubular necrosis superimposed on stage 3b chronic kidney disease (HCC) 12/24/2022   Allergic rhinitis due to pollen 06/28/2015   Anxiety with depression 02/18/2020   Arthritis    gout   Chest pain 11/12/2018   Chronic combined systolic (congestive) and diastolic (congestive) heart failure (HCC)    a. 02/2017 Echo: EF 30-35%, diff HK, sev HK/poss AK of ant and lat walls. Gr1 DD. Mild to mod MR. Nl RV fxn. Mod TR.   Chronic obstructive pulmonary disease (HCC) 09/02/2019   tx with trelegy and SABA   Chronic systolic heart failure (HCC) 11/21/2017   CKD (chronic kidney  disease), stage IV (HCC)    Controlled substance agreement signed 09/07/2015   Coronary artery disease involving native coronary artery    Degenerative disc disease, cervical 03/17/2015   Demand ischemia (HCC) 12/25/2022   Depression, recurrent 12/02/2017   Essential hypertension, malignant 02/12/2012   GAD (generalized anxiety disorder) 03/17/2015   History of acute anterior wall MI 02/12/2012   Hyperlipidemia    Hypothyroidism    Ischemic cardiomyopathy    a. 02/2012 Echo: EF 40-45%, ant/lat HK, Gr1 DD, Mild MR, PASP ; b. 02/2017 Echo: EF 30-35%.   NSTEMI (non-ST elevated myocardial infarction) (HCC) 12/24/2022   NSVT (nonsustained ventricular tachycardia) (HCC)    a. 02/2012 post-MI   Obesity    Pancreatitis    Rhinosinusitis 09/02/2019   S/P primary angioplasty with coronary stent 03/04/2018   Dual antiplatelet therapy INDEFINITELY per cardiology note Nov 2019   Squamous cell carcinoma of back 04/20/2019   referrred to sugery for f/up excision, derm not available for several month, lesion with rapid change cannot wait, still see derm for skin survey etc   Stage 3b chronic kidney disease (HCC) 02/18/2020   Tobacco abuse    Tobacco dependence 02/18/2020    Past Surgical History:  Procedure Laterality Date   ABDOMINAL HYSTERECTOMY     BIOPSY  10/30/2022  Procedure: BIOPSY;  Surgeon: Therisa Bi, MD;  Location: [facility] ENDOSCOPY;  Service: Gastroenterology;;   CARDIAC CATHETERIZATION  2013   [facility] s/p stent   CARDIAC CATHETERIZATION Left 08/03/2015   Procedure: Left Heart Cath and Coronary Angiography;  Surgeon: Deatrice DELENA Cage, MD;  Location: [facility] INVASIVE CV LAB;  Service: Cardiovascular;  Laterality: Left;   COLONOSCOPY WITH PROPOFOL  N/A 10/30/2022   Procedure: COLONOSCOPY WITH PROPOFOL ;  Surgeon: Therisa Bi, MD;  Location: [facility] ENDOSCOPY;  Service: Gastroenterology;  Laterality: N/A;   DILATION AND CURETTAGE OF UTERUS     ESOPHAGOGASTRODUODENOSCOPY (EGD) WITH PROPOFOL   N/A 10/30/2022   Procedure: ESOPHAGOGASTRODUODENOSCOPY (EGD) WITH PROPOFOL ;  Surgeon: Therisa Bi, MD;  Location: [facility] ENDOSCOPY;  Service: Gastroenterology;  Laterality: N/A;   LEFT HEART CATHETERIZATION WITH CORONARY ANGIOGRAM N/A 02/11/2012   Procedure: LEFT HEART CATHETERIZATION WITH CORONARY ANGIOGRAM;  Surgeon: Ozell Fell, MD;  Location: [facility] CATH LAB;  Service: Cardiovascular;  Laterality: N/A;   POLYPECTOMY  10/30/2022   Procedure: POLYPECTOMY;  Surgeon: Therisa Bi, MD;  Location: [facility] ENDOSCOPY;  Service: Gastroenterology;;   RENAL INTERVENTION N/A 11/17/2018   Procedure: RENAL INTERVENTION;  Surgeon: Jama Cordella MATSU, MD;  Location: [facility] INVASIVE CV LAB;  Service: Cardiovascular;  Laterality: N/A;    Current Medications: Current Meds  Medication Sig   acetaminophen  (TYLENOL ) 500 MG tablet Take 1,000 mg by mouth every 6 (six) hours as needed.   allopurinol  (ZYLOPRIM ) 100 MG tablet Take 1 tablet (100 mg total) by mouth daily.   aspirin  81 MG tablet Take 1 tablet (81 mg total) by mouth daily.   atorvastatin  (LIPITOR ) 80 MG tablet Take 1 tablet (80 mg total) by mouth at bedtime. (Hold when needing to take colchicine  for acute gout flares)   busPIRone  (BUSPAR ) 5 MG tablet TAKE 1 TO 3 TABLETS BY MOUTH TWICE DAILY AS NEEDED   carvedilol  (COREG ) 12.5 MG tablet TAKE ONE (1) TABLET (12.5 MG TOTAL) BY MOUTH TWO (2) (TWO) TIMES DAILY WITH A MEAL.   cetirizine  (ZYRTEC ) 10 MG tablet TAKE (1) TABLET BY MOUTH  AT BEDTIME   clonazePAM  (KLONOPIN ) 0.5 MG tablet Take 1 tablet (0.5 mg total) by mouth 2 (two) times daily as needed for anxiety (use sparingly).   clopidogrel  (PLAVIX ) 75 MG tablet TAKE (1) TABLET BY MOUTH EVERY DAY   colchicine  0.6 MG tablet Take 2 tabs for initial dose, then take 1 tab daily until pain subsides   famotidine  (PEPCID ) 20 MG tablet Take 1 tablet (20 mg total) by mouth daily.   gabapentin  (NEURONTIN ) 300 MG capsule Take 1 capsule (300 mg total) by mouth 2 (two) times daily.    hydrOXYzine  (ATARAX ) 25 MG tablet Take 1 tablet (25 mg total) by mouth every 8 (eight) hours as needed for anxiety.   levothyroxine  (SYNTHROID ) 125 MCG tablet Take 1 tablet (125 mcg total) by mouth daily before breakfast.   nitroGLYCERIN  (NITROSTAT ) 0.4 MG SL tablet Place 1 tablet (0.4 mg total) under the tongue every 5 (five) minutes x 3 doses as needed for chest pain.   PARoxetine  (PAXIL ) 40 MG tablet TAKE (1) TABLET BY MOUTH EVERY DAY   sacubitril -valsartan  (ENTRESTO ) 97-103 MG TAKE ONE (1) TABLET BY MOUTH TWO (2) (TWO) TIMES DAILY.   torsemide  (DEMADEX ) 20 MG tablet TAKE (1) TABLET BY MOUTH EVERY DAY   [DISCONTINUED] pantoprazole  (PROTONIX ) 40 MG tablet Take 1 tablet (40 mg total) by mouth daily.   Current Facility-Administered Medications for the 03/01/24 encounter (Office Visit) with Abigail Bernardino HERO, PA-C  Medication  mometasone -formoterol  (DULERA ) 200-5 MCG/ACT inhaler 2 puff    Allergies:   Patient has no known allergies.   Social History   Socioeconomic History   Marital status: Legally Separated    Spouse name: Not on file   Number of children: Not on file   Years of education: Not on file   Highest education level: Not on file  Occupational History   Not on file  Tobacco Use   Smoking status: Every Day    Current packs/day: 0.25    Average packs/day: 0.3 packs/day for 30.0 years (7.5 ttl pk-yrs)    Types: Cigarettes   Smokeless tobacco: Never  Vaping Use   Vaping status: Never Used  Substance and Sexual Activity   Alcohol use: Not Currently    Comment: Says she previously drank socially - never a heavy drinker.  No etoh since 08/2018.   Drug use: No    Types: Marijuana   Sexual activity: Not on file  Other Topics Concern   Not on file  Social History Narrative   Lives in [place] w/ her dtr.  Does not routinely exercise.  Disabled/doesn't work.   Social Drivers of Corporate Investment Banker Strain: Not on file  Food Insecurity: No Food Insecurity (09/30/2023)    Hunger Vital Sign    Worried About Running Out of Food in the Last Year: Never true    Ran Out of Food in the Last Year: Never true  Transportation Needs: No Transportation Needs (09/30/2023)   PRAPARE - Administrator, Civil Service (Medical): No    Lack of Transportation (Non-Medical): No  Physical Activity: Not on file  Stress: Not on file  Social Connections: Not on file     Family History:  The patient's family history includes AAA (abdominal aortic aneurysm) in her father; Cancer in her paternal grandfather; Diabetes in her mother; Hyperlipidemia in her mother; Hypertension in her brother, brother, daughter, father, mother, sister, and sister; Kidney disease in her mother; Supraventricular tachycardia in her sister; Thyroid  disease in her father.  ROS:   12-point review of systems is negative unless otherwise noted in the HPI.   EKGs/Labs/Other Studies Reviewed:    Studies reviewed were summarized above. The additional studies were reviewed today:  Renal artery ultrasound 12/27/2022: IMPRESSION: 1. Arterial velocities near the upper limit of normal are noted at the origin of the right renal artery, which could suggest stenosis approaching 50-60%. 2. The patient has a known left renal artery stent. There are slightly elevated velocities noted in the mid left renal artery, which could suggest an element of early in-stent stenosis. This is somewhat further supported by borderline low resistive indices measured in the arcuate arteries. These findings could be further evaluated with CT angiography if clinically appropriate. __________   2D echo 12/27/2022: 1. Left ventricular ejection fraction, by estimation, is 35 to 40%. Left  ventricular ejection fraction by 2D MOD biplane is 41.7 %. Left  ventricular ejection fraction by PLAX is 39 %. The left ventricle has  moderately decreased function. The left  ventricle demonstrates global hypokinesis. There is mild left  ventricular  hypertrophy. Left ventricular diastolic parameters are consistent with  Grade II diastolic dysfunction (pseudonormalization). There is severe  hypokinesis of the left ventricular,  entire anterior wall and anteroseptal wall.   2. Right ventricular systolic function is low normal. The right  ventricular size is normal.   3. Left atrial size was moderately dilated.   4. Right atrial size  was moderately dilated.   5. The mitral valve is normal in structure. Moderate to severe mitral  valve regurgitation.   6. Tricuspid valve regurgitation is moderate.   7. The aortic valve is grossly normal. Aortic valve regurgitation is not  visualized.  __________   Renal artery ultrasound 07/02/2022: Summary:  Largest Aortic Diameter: 2.4 cm    Renal:    Right: Normal size right kidney. No evidence of right renal artery         stenosis. Normal right Resisitive Index. Normal cortical         thickness of right kidney. RRV flow present.  Left:  Normal size of left kidney. No evidence of left renal artery         stenosis. LRV flow present. Normal left Resistive Index.         Normal cortical thickness of the left kidney.  Mesenteric:  Normal Celiac artery and Superior Mesenteric artery findings.  __________   2D echo 02/19/2019:  1. Left ventricular ejection fraction, by visual estimation, is 35 to  40%. The left ventricle has moderately decreased function. There is no  left ventricular hypertrophy. Anterior and Septal wall hypokinesis.   2. Left ventricular diastolic parameters are consistent with Grade II  diastolic dysfunction (pseudonormalization).   3. Mildly dilated left ventricular internal cavity size.   4. Global right ventricle has normal systolic function.The right  ventricular size is mildly enlarged. No increase in right ventricular wall  thickness.   5. Left atrial size was mild-moderately dilated.   6. Right atrial size was mildly dilated.   7. Moderately elevated  pulmonary artery systolic pressure.   8. The tricuspid regurgitant velocity is 2.80 m/s, and with an assumed  right atrial pressure of 20 mmHg, the estimated right ventricular systolic  pressure is moderately elevated at 51.2 mmHg.   9. Small pericardial effusion. __________   2D echo 02/21/2017: - Left ventricle: The cavity size was normal. Systolic function was    moderately to severely reduced. The estimated ejection fraction    was in the range of 30% to 35%. Diffuse hypokinesis with severe    hypokinesis /possible akinesis of the anterior and lateral walls.    Doppler parameters are consistent with abnormal left ventricular    relaxation (grade 1 diastolic dysfunction).  - Mitral valve: There was mild to moderate regurgitation.  - Left atrium: The atrium was normal in size.  - Right ventricle: Systolic function was normal.  - Tricuspid valve: There was moderate regurgitation.  - Pulmonary arteries: Systolic pressure was mildly elevated. __________   Lexiscan  MPI 02/14/2017: There was no ST segment deviation noted during stress. No T wave inversion was noted during stress. Defect 1: There is a medium defect of severe severity present in the basal anterior, basal anterolateral, mid anterior and mid anterolateral location. This is an intermediate risk study. The left ventricular ejection fraction is moderately decreased (30-44%). Findings consistent with prior myocardial infarction with mild peri-infarct ischemia. __________   2D echo 10/06/2015: - Left ventricle: The cavity size was normal. Systolic function was    moderately reduced. The estimated ejection fraction was in the    range of 35% to 40%. Hypokinesis of the anterior myocardium.    Hypokinesis of the anteroseptal myocardium. Doppler parameters    are consistent with abnormal left ventricular relaxation (grade 1    diastolic dysfunction).  - Mitral valve: Calcified annulus. There was mild regurgitation.  - Left atrium:  The atrium was normal  in size.  - Right ventricle: Systolic function was normal.  - Pulmonary arteries: Systolic pressure was within the normal    range. ___________   LHC 08/03/2015: Ost LM to LM lesion, 10% stenosed. The lesion was previously treated with a stent (unknown type). Mid LAD lesion, 60% stenosed. Mid RCA lesion, 30% stenosed. There is moderate left ventricular systolic dysfunction.   1. Widely patent ostial left main stent with moderate mid LAD stenosis and occluded second diagonal with faint collaterals. The right coronary artery is very large with mild mid stenosis. 2. Moderately reduced LV systolic function with an ejection fraction of 35-40% with significant anterior wall hypokinesis. Mildly elevated left ventricular end-diastolic pressure. 3. Significant radial artery spasm which required increased amounts of sedation.   Recommendations: Continue medical therapy for coronary artery disease and chronic systolic heart failure. We will need to transition her from clonidine  to an ACE inhibitor or ARB with close monitoring of renal function. __________   Lexiscan  MPI 07/28/2015: Pharmacological myocardial perfusion imaging study with attenuation corrected images suggesting mild anterior wall ischemia, mid to apical region Nonattenuation corrected images also with mild mid to apical anterior wall ischemia Patient attempted exercise on bruce protocol though unable to reach target heart rate, changed to lexiscan  Anteroseptal wall hypokinesis noted, EF estimated at 33% No EKG changes concerning for ischemia at peak stress or in recovery. Moderate risk scan with mild ischemia as above __________   2D echo 02/12/2012: - Left ventricle: The cavity size was normal. Wall thickness    was increased in a pattern of mild LVH. Systolic function    was mildly to moderately reduced. The estimated ejection    fraction was in the range of 40% to 45%. Hypokinesis of    the entireanterior  and lateral myocardium. Doppler    parameters are consistent with abnormal left ventricular    relaxation (grade 1 diastolic dysfunction).  - Mitral valve: Mild regurgitation.  - Pulmonary arteries: PA peak pressure: 43mm Hg (S).   EKG:  EKG is ordered today.  The EKG ordered today demonstrates NSR, 71 bpm, biatrial enlargement, incomplete RBBB, LAFB, LVH, prior septal MI, nonspecific ST-T changes, consistent with prior tracings  Recent Labs: 10/02/2023: Magnesium  2.7 10/05/2023: B Natriuretic Peptide 2,681.0 12/28/2023: Hemoglobin 15.6; Platelets 173 02/17/2024: ALT 19; BUN 19; BUN 19; Creat 1.18; Creat 1.18; Potassium 4.2; Potassium 4.2; Sodium 141; Sodium 141; TSH 2.92  Recent Lipid Panel    Component Value Date/Time   CHOL 136 03/20/2023 1128   CHOL 136 02/27/2018 1207   TRIG 155 (H) 03/20/2023 1128   HDL 48 (L) 03/20/2023 1128   HDL 53 02/27/2018 1207   CHOLHDL 2.8 03/20/2023 1128   VLDL 15 11/13/2018 0628   LDLCALC 65 03/20/2023 1128    PHYSICAL EXAM:    VS:  BP 120/80 (BP Location: Left Arm, Patient Position: Sitting, Cuff Size: Normal)   Pulse 71 Comment: 77 oximeter  Ht 5' 7 (1.702 m)   Wt 183 lb 12.8 oz (83.4 kg)   SpO2 95%   BMI 28.79 kg/m   BMI: Body mass index is 28.79 kg/m.  Physical Exam Vitals reviewed.  Constitutional:      Appearance: She is well-developed.  HENT:     Head: Normocephalic and atraumatic.  Eyes:     General:        Right eye: No discharge.        Left eye: No discharge.  Cardiovascular:     Rate and Rhythm: Normal rate  and regular rhythm.     Heart sounds: S1 normal and S2 normal. Heart sounds not distant. No midsystolic click and no opening snap. Murmur heard.     Systolic murmur is present with a grade of 1/6 at the upper left sternal border.     No friction rub.  Pulmonary:     Effort: Pulmonary effort is normal. No respiratory distress.     Breath sounds: Normal breath sounds. No decreased breath sounds, wheezing, rhonchi or  rales.  Musculoskeletal:     Cervical back: Normal range of motion.     Right lower leg: No edema.     Left lower leg: No edema.  Skin:    General: Skin is warm and dry.     Nails: There is no clubbing.  Neurological:     Mental Status: She is alert and oriented to person, place, and time.  Psychiatric:        Speech: Speech normal.        Behavior: Behavior normal.        Thought Content: Thought content normal.        Judgment: Judgment normal.     Wt Readings from Last 3 Encounters:  03/01/24 183 lb 12.8 oz (83.4 kg)  02/17/24 182 lb 11.2 oz (82.9 kg)  01/14/24 175 lb 8 oz (79.6 kg)     ASSESSMENT & PLAN:   CAD involving native coronary arteries without angina: She is without symptoms of angina.  Continue aggressive risk factor modification and secondary prevention including aspirin  81 mg and clopidogrel  75 mg indefinitely, as long as tolerated, given prior left main stenting.  She otherwise remains on carvedilol  12.5 mg twice daily and atorvastatin  80 mg.  HFrEF secondary to ICM: She does appear slightly volume up today with a weight that is up 6 pounds when compared to her visit in 06/2023.  She is largely sedentary at baseline with NYHA class around III, though this is difficult to assess.  Check BNP and BMP with recommendation to escalate diuresis/GDMT as indicated.  Anticipate addition of SGLT2 inhibitor pending labs.  Not currently on MRA given baseline renal dysfunction.  Continue carvedilol  12.5 mg twice daily, Entresto  97/103 mg twice daily, and torsemide  20 mg daily.  Plan for follow-up echo in several months time on maximally tolerated GDMT to evaluate for improvement in LV systolic function.  CHF education.  Mitral and tricuspid regurgitation: Anticipate follow-up echo in several months time following further optimization of GDMT.  CKD stage IIIb: Labs obtained earlier this month showed improved renal function.  Avoid nephrotoxic agents.  HTN with RAS: Blood pressure  is well-controlled in the office today.  She remains on carvedilol  12.5 mg twice daily and Entresto  97/103 mg twice daily.  HLD: LDL 65 in 03/2023 with normal AST/ALT in 02/2024.  She remains on atorvastatin  80 mg.  Anticipate fasting labs at next visit.  Alcohol and tobacco use: Complete cessation is recommended.      Disposition: F/u with Dr. Darron or an APP in 2 months.   Medication Adjustments/Labs and Tests Ordered: Current medicines are reviewed at length with the patient today.  Concerns regarding medicines are outlined above. Medication changes, Labs and Tests ordered today are summarized above and listed in the Patient Instructions accessible in Encounters.   Signed, Bernardino Bring, PA-C 03/01/2024 4:25 PM     [facility] - [place] [address] [place], [place] [address] [phone]

## 2024-03-01 ENCOUNTER — Ambulatory Visit: Attending: Physician Assistant | Admitting: Physician Assistant

## 2024-03-01 ENCOUNTER — Encounter: Payer: Self-pay | Admitting: Physician Assistant

## 2024-03-01 VITALS — BP 120/80 | HR 71 | Ht 67.0 in | Wt 183.8 lb

## 2024-03-01 DIAGNOSIS — I255 Ischemic cardiomyopathy: Secondary | ICD-10-CM | POA: Insufficient documentation

## 2024-03-01 DIAGNOSIS — I10 Essential (primary) hypertension: Secondary | ICD-10-CM | POA: Diagnosis not present

## 2024-03-01 DIAGNOSIS — Z79899 Other long term (current) drug therapy: Secondary | ICD-10-CM | POA: Insufficient documentation

## 2024-03-01 DIAGNOSIS — N1832 Chronic kidney disease, stage 3b: Secondary | ICD-10-CM | POA: Diagnosis not present

## 2024-03-01 DIAGNOSIS — I071 Rheumatic tricuspid insufficiency: Secondary | ICD-10-CM | POA: Diagnosis not present

## 2024-03-01 DIAGNOSIS — I701 Atherosclerosis of renal artery: Secondary | ICD-10-CM | POA: Diagnosis not present

## 2024-03-01 DIAGNOSIS — I2510 Atherosclerotic heart disease of native coronary artery without angina pectoris: Secondary | ICD-10-CM | POA: Diagnosis not present

## 2024-03-01 DIAGNOSIS — I340 Nonrheumatic mitral (valve) insufficiency: Secondary | ICD-10-CM | POA: Diagnosis not present

## 2024-03-01 DIAGNOSIS — E785 Hyperlipidemia, unspecified: Secondary | ICD-10-CM | POA: Diagnosis not present

## 2024-03-01 DIAGNOSIS — F1090 Alcohol use, unspecified, uncomplicated: Secondary | ICD-10-CM | POA: Diagnosis not present

## 2024-03-01 DIAGNOSIS — Z720 Tobacco use: Secondary | ICD-10-CM | POA: Diagnosis not present

## 2024-03-01 DIAGNOSIS — I5022 Chronic systolic (congestive) heart failure: Secondary | ICD-10-CM | POA: Diagnosis not present

## 2024-03-01 MED ORDER — FAMOTIDINE 20 MG PO TABS: 20.0000 mg | ORAL_TABLET | Freq: Every day | ORAL | 3 refills | Status: AC

## 2024-03-01 MED ORDER — PANTOPRAZOLE SODIUM 40 MG PO TBEC: 40.0000 mg | DELAYED_RELEASE_TABLET | Freq: Every day | ORAL | 3 refills | Status: DC

## 2024-03-01 NOTE — Patient Instructions (Addendum)
 Medication Instructions:  Your physician recommends the following medication changes.   START TAKING: Pepcid  20 mg by mouth once a day  Continue all other medications as prescribed  *If you need a refill on your cardiac medications before your next appointment, please call your pharmacy*  Lab Work: Your provider would like for you to have following labs drawn today BMET, BNP.   If you have labs (blood work) drawn today and your tests are completely normal, you will receive your results only by: MyChart Message (if you have MyChart) OR A paper copy in the mail If you have any lab test that is abnormal or we need to change your treatment, we will call you to review the results.  Testing/Procedures: No test ordered today   Follow-Up: At [facility], you and your health needs are our priority.  As part of our continuing mission to provide you with exceptional heart care, our providers are all part of one team.  This team includes your primary Cardiologist (physician) and Advanced Practice Providers or APPs (Physician Assistants and Nurse Practitioners) who all work together to provide you with the care you need, when you need it.  Your next appointment:   2 month(s)  Provider:   You may see Deatrice Cage, MD or one of the following Advanced Practice Providers on your designated Care Team:    Bernardino Bring, PA-C   We recommend signing up for the patient portal called MyChart.  Sign up information is provided on this After Visit Summary.  MyChart is used to connect with patients for Virtual Visits (Telemedicine).  Patients are able to view lab/test results, encounter notes, upcoming appointments, etc.  Non-urgent messages can be sent to your provider as well.   To learn more about what you can do with MyChart, go to [email or web address].

## 2024-03-02 ENCOUNTER — Ambulatory Visit: Payer: Self-pay | Admitting: Physician Assistant

## 2024-03-02 DIAGNOSIS — Z79899 Other long term (current) drug therapy: Secondary | ICD-10-CM

## 2024-03-02 LAB — BRAIN NATRIURETIC PEPTIDE: BNP: 664.8 pg/mL — ABNORMAL HIGH (ref 0.0–100.0)

## 2024-03-02 LAB — BASIC METABOLIC PANEL WITH GFR
BUN/Creatinine Ratio: 10 (ref 9–23)
BUN: 13 mg/dL (ref 6–24)
CO2: 21 mmol/L (ref 20–29)
Calcium: 8.9 mg/dL (ref 8.7–10.2)
Chloride: 103 mmol/L (ref 96–106)
Creatinine, Ser: 1.3 mg/dL — ABNORMAL HIGH (ref 0.57–1.00)
Glucose: 94 mg/dL (ref 70–99)
Potassium: 3.9 mmol/L (ref 3.5–5.2)
Sodium: 142 mmol/L (ref 134–144)
eGFR: 49 mL/min/1.73 — ABNORMAL LOW (ref >59)

## 2024-03-05 MED ORDER — DAPAGLIFLOZIN PROPANEDIOL 10 MG PO TABS: 10.0000 mg | ORAL_TABLET | Freq: Every day | ORAL | 11 refills | Status: AC

## 2024-03-08 ENCOUNTER — Other Ambulatory Visit (HOSPITAL_COMMUNITY): Payer: Self-pay

## 2024-03-08 ENCOUNTER — Telehealth: Payer: Self-pay | Admitting: Pharmacy Technician

## 2024-03-08 NOTE — Telephone Encounter (Signed)
 Pharmacy Patient Advocate Encounter   Received notification from Fax that prior authorization for Farxiga  10  is required/requested.   Insurance verification completed.   The patient is insured through [facility] MEDICAID.   Per test claim: PA required; PA submitted to above mentioned insurance via Latent Key/confirmation #/EOC [facility] Status is pending

## 2024-03-08 NOTE — Telephone Encounter (Signed)
 Pharmacy Patient Advocate Encounter  Received notification from [facility] MEDICAID that Prior Authorization for Farxiga  has been APPROVED from 03/08/24 to 03/08/25   PA #/Case ID/Reference #: [id number]

## 2024-03-24 ENCOUNTER — Other Ambulatory Visit: Payer: Self-pay | Admitting: Nurse Practitioner

## 2024-03-24 ENCOUNTER — Other Ambulatory Visit: Payer: Self-pay | Admitting: Emergency Medicine

## 2024-03-24 DIAGNOSIS — Z79899 Other long term (current) drug therapy: Secondary | ICD-10-CM

## 2024-03-24 DIAGNOSIS — F411 Generalized anxiety disorder: Secondary | ICD-10-CM

## 2024-03-25 ENCOUNTER — Ambulatory Visit: Payer: Self-pay | Admitting: Physician Assistant

## 2024-03-25 DIAGNOSIS — Z79899 Other long term (current) drug therapy: Secondary | ICD-10-CM

## 2024-03-25 LAB — BASIC METABOLIC PANEL WITH GFR
BUN/Creatinine Ratio: 15 (ref 9–23)
BUN: 21 mg/dL (ref 6–24)
CO2: 22 mmol/L (ref 20–29)
Calcium: 9.9 mg/dL (ref 8.7–10.2)
Chloride: 101 mmol/L (ref 96–106)
Creatinine, Ser: 1.38 mg/dL — ABNORMAL HIGH (ref 0.57–1.00)
Glucose: 90 mg/dL (ref 70–99)
Potassium: 3.3 mmol/L — ABNORMAL LOW (ref 3.5–5.2)
Sodium: 142 mmol/L (ref 134–144)
eGFR: 45 mL/min/1.73 — ABNORMAL LOW (ref >59)

## 2024-03-26 ENCOUNTER — Encounter: Payer: Self-pay | Admitting: Emergency Medicine

## 2024-03-26 MED ORDER — POTASSIUM CHLORIDE CRYS ER 10 MEQ PO TBCR: 10.0000 meq | EXTENDED_RELEASE_TABLET | Freq: Every day | ORAL | 11 refills | Status: DC

## 2024-03-26 NOTE — Telephone Encounter (Signed)
 The patient has been notified of the results along with recommendations -  verbalized understanding. All questions (if any) were answered.  Reminded of upcoming appointment and encouraged to call if any concerns or  questions arise  [facility] and [facility] password reset sent, Letter sent per pt request

## 2024-03-26 NOTE — Telephone Encounter (Signed)
 Requested medication (s) are due for refill today - yes  Requested medication (s) are on the active medication list -yes  Future visit scheduled -yes  Last refill: 02/17/24 #30  Notes to clinic: non delegated Rx  Requested Prescriptions  Pending Prescriptions Disp Refills   clonazePAM  (KLONOPIN ) 0.5 MG tablet [Pharmacy Med Name: CLONAZEPAM  0.5MG  TABLET] 30 tablet 0    Sig: TAKE ONE (1) TABLET (0.5 MG TOTAL) BY MOUTH TWICE DAILY AS NEEDED FOR ANXIETY (USE SPARINGLY).     Not Delegated - Psychiatry: Anxiolytics/Hypnotics 2 Failed - 03/26/2024 10:56 AM      Failed - This refill cannot be delegated      Failed - Urine Drug Screen completed in last 360 days      Passed - Patient is not pregnant      Passed - Valid encounter within last 6 months    Recent Outpatient Visits           1 month ago GAD (generalized anxiety disorder)   [place] [facility] Gareth Mliss FALCON, FNP   2 months ago GAD (generalized anxiety disorder)   [facility] Leavy Mole, PA-C   8 months ago Other secondary acute gout of right wrist   [facility] Gareth Mliss FALCON, FNP       Future Appointments             In 1 month Dunn, Bernardino HERO, PA-C [facility]               Requested Prescriptions  Pending Prescriptions Disp Refills   clonazePAM  (KLONOPIN ) 0.5 MG tablet [Pharmacy Med Name: CLONAZEPAM  0.5MG  TABLET] 30 tablet 0    Sig: TAKE ONE (1) TABLET (0.5 MG TOTAL) BY MOUTH TWICE DAILY AS NEEDED FOR ANXIETY (USE SPARINGLY).     Not Delegated - Psychiatry: Anxiolytics/Hypnotics 2 Failed - 03/26/2024 10:56 AM      Failed - This refill cannot be delegated      Failed - Urine Drug Screen completed in last 360 days      Passed - Patient is not pregnant      Passed - Valid encounter within last 6 months    Recent Outpatient Visits           1 month ago GAD (generalized anxiety disorder)   [place]  [facility] Gareth Mliss FALCON, FNP   2 months ago GAD (generalized anxiety disorder)   [facility] Leavy Mole, PA-C   8 months ago Other secondary acute gout of right wrist   [facility] Gareth Mliss FALCON, FNP       Future Appointments             In 1 month Dunn, Bernardino HERO, PA-C [place] [facility]

## 2024-05-02 NOTE — Progress Notes (Unsigned)
 "  Cardiology Office Note    Date:  05/03/2024   ID:  Angelica Herrera, DOB [birth year], MRN [id number]  PCP:  Leavy Mole, PA-C (Inactive)  Cardiologist:  Deatrice Cage, MD  Electrophysiologist:  None   Chief Complaint: Follow up  History of Present Illness:   Angelica Herrera is a 54 y.o. female with history of CAD with anterolateral ST elevation MI in 01/2012 with PTCA/DES to the left main at that time as detailed below complicated by hemodynamic instability and VT, HFrEF secondary to ICM, mitral and tricuspid regurgitation, left renal artery stenosis status post left renal artery stenting in 11/2018 by vascular surgery, CKD stage III, HLD, hypothyroidism, depression, and tobacco use who presents for follow-up of her CAD and cardiomyopathy.   She was admitted to the hospital in 01/2012 with an anterolateral STEMI. She underwent emergent cardiac catheterization which showed occlusion of the left main coronary artery. There was significant difficulty engaging the left main, though she ultimately underwent successful PCI/DES. She also had PTCA done to the LAD due to embolization from the left main. Post procedure she had significant hemodynamic instablity with VT and hypotension. She was noted to have severe MR initially, however subsequent echocardiogram showed an EF of 45% with no significant MR. She was lost to follow up from 2013-2017. Upon re-establishing with our group in 2017, she continued to smoke and had been off her medications. In 2017, she had atypical chest pain with a Lexiscan  MPI showing prior anterior infarct with mild peri-infarct ischemia and an EF of 33%. She underwent repeat cardiac cath at that time which showed a patent left main stent with moderate mid LAD stenosis and an occluded D2 with faint collaterals. The RCA was very large with mild mid stenosis. EF was 35-40% with anterior wall hypokinesis. Continued medical therapy was advised. She underwent repeat nuclear stress  testing in 02/2017 for recurrent chest pain that showed a similar finding of prior anterior infarct with mild peri-infarct ischemia.  Echo at that time demonstrated an EF of 30 to 35%, diffuse hypokinesis with severe hypokinesis/possible akinesis of the anterior and lateral walls, grade 1 diastolic dysfunction, mild to moderate mitral regurgitation, normal RV systolic function, and mildly elevated PASP.  Most recent echo from 02/2019 demonstrated an EF of 35 to 40%, anterior and septal wall hypokinesis, grade 2 diastolic dysfunction, mildly dilated LV internal cavity size, normal RV systolic function with mildly enlarged ventricular cavity, mildly to moderately dilated left atrium, mildly dilated right atrium, moderately elevated PASP estimated at 51.2 mmHg.  In follow-up visits she has required titration of GDMT and diuretic therapy.   She was admitted to the hospital in 12/2022 with pancreatitis complicated by AKI and demand ischemia with a high-sensitivity troponin peaking at 75.  Echo during the admission showed an EF of 35 to 40%, global hypokinesis with severe hypokinesis of the entire anterior and anteroseptal wall, mild LVH, grade 2 diastolic dysfunction, low normal RV systolic function with normal ventricular cavity size, moderate biatrial enlargement, moderate to severe mitral regurgitation, and moderate tricuspid regurgitation.  Renal artery ultrasound with upper limit of normal arterial velocities in the right renal artery suggestive of stenosis approaching 50 to 60% and slightly elevated velocities noted in the mid left renal artery with known stenting of this vessel.   She was seen in the office in 06/2023 and reported adherence to cardiac medications.  She reported drinking several beers the first 5 days of the month and otherwise abstained.  She  continued to smoke and was not ready to quit.  She was admitted to the hospital in 09/2023 with gout and UTI with admission further complicated by AKI and  concern for pulmonary edema due to IV fluids given for sepsis protocol, and hypokalemia.  During the admission, BNP trended from 245 to greater than 4500 to 2681.  Troponin was minimally elevated and flat trending peaking at 26.  She was last seen by cardiology in 02/2024 and felt like she was mildly volume up.  Her weight was up 6 pounds and compared to her visit in 06/2023.  She continued to drink two, 12 ounce Mountain Dew's daily and 5-6 bottles of water daily.  She was drinking 1-2 beers several days per week and continued to smoke.  BNP obtained at that time was improved at 664.  She was started on Farxiga  10 mg daily with continuation of torsemide  20 mg daily, along with carvedilol  and Entresto .  She comes in doing well from a cardiac perspective and is without symptoms of angina or cardiac decompensation.  Chronic dyspnea is stable.  No dizziness, presyncope, or syncope.  No significant lower extremity swelling or progressive orthopnea.  Drinks two, [employer] daily and 1 to 2, 16-ounce beers several days per week.  Continues to smoke 1 pack/day and is not ready to quit.  No falls or symptoms concerning for bleeding.  Has tolerated the addition of Farxiga  without off target effect.  Currently out of potassium.  Overall, feels like symptoms are somewhat improved when compared to prior visits.   Labs independently reviewed: 03/2024 - BUN 21, serum creatinine 1.38, potassium 3.3 02/2024 - BNP 664, albumin 4.3, AST/ALT normal, TSH normal 12/2023 - Hgb 15.6, PLT 173 09/2023 - BNP 2681 (improved from > 4500), magnesium  2.7 03/2023 - A1c 6.1, TC 136, TG 155, HDL 48, LDL 65  Past Medical History:  Diagnosis Date   Accelerated hypertension 11/13/2018   Acute kidney failure 01/16/2023   Acute pyelonephritis 09/28/2023   Acute renal failure with acute tubular necrosis superimposed on stage 3b chronic kidney disease (HCC) 12/24/2022   Allergic rhinitis due to pollen 06/28/2015   Anxiety with  depression 02/18/2020   Arthritis    gout   Chest pain 11/12/2018   Chronic combined systolic (congestive) and diastolic (congestive) heart failure (HCC)    a. 02/2017 Echo: EF 30-35%, diff HK, sev HK/poss AK of ant and lat walls. Gr1 DD. Mild to mod MR. Nl RV fxn. Mod TR.   Chronic obstructive pulmonary disease (HCC) 09/02/2019   tx with trelegy and SABA   Chronic systolic heart failure (HCC) 11/21/2017   CKD (chronic kidney disease), stage IV (HCC)    Controlled substance agreement signed 09/07/2015   Coronary artery disease involving native coronary artery    Degenerative disc disease, cervical 03/17/2015   Demand ischemia (HCC) 12/25/2022   Depression, recurrent 12/02/2017   Essential hypertension, malignant 02/12/2012   GAD (generalized anxiety disorder) 03/17/2015   History of acute anterior wall MI 02/12/2012   Hyperlipidemia    Hypothyroidism    Ischemic cardiomyopathy    a. 02/2012 Echo: EF 40-45%, ant/lat HK, Gr1 DD, Mild MR, PASP ; b. 02/2017 Echo: EF 30-35%.   NSTEMI (non-ST elevated myocardial infarction) (HCC) 12/24/2022   NSVT (nonsustained ventricular tachycardia) (HCC)    a. 02/2012 post-MI   Obesity    Pancreatitis    Rhinosinusitis 09/02/2019   S/P primary angioplasty with coronary stent 03/04/2018   Dual antiplatelet therapy INDEFINITELY  per cardiology note Nov 2019   Squamous cell carcinoma of back 04/20/2019   referrred to sugery for f/up excision, derm not available for several month, lesion with rapid change cannot wait, still see derm for skin survey etc   Stage 3b chronic kidney disease (HCC) 02/18/2020   Tobacco abuse    Tobacco dependence 02/18/2020    Past Surgical History:  Procedure Laterality Date   ABDOMINAL HYSTERECTOMY     BIOPSY  10/30/2022   Procedure: BIOPSY;  Surgeon: Therisa Bi, MD;  Location: [facility] ENDOSCOPY;  Service: Gastroenterology;;   CARDIAC CATHETERIZATION  2013   [facility] s/p stent   CARDIAC CATHETERIZATION Left 08/03/2015    Procedure: Left Heart Cath and Coronary Angiography;  Surgeon: Deatrice DELENA Cage, MD;  Location: [facility] INVASIVE CV LAB;  Service: Cardiovascular;  Laterality: Left;   COLONOSCOPY WITH PROPOFOL  N/A 10/30/2022   Procedure: COLONOSCOPY WITH PROPOFOL ;  Surgeon: Therisa Bi, MD;  Location: [facility] ENDOSCOPY;  Service: Gastroenterology;  Laterality: N/A;   DILATION AND CURETTAGE OF UTERUS     ESOPHAGOGASTRODUODENOSCOPY (EGD) WITH PROPOFOL  N/A 10/30/2022   Procedure: ESOPHAGOGASTRODUODENOSCOPY (EGD) WITH PROPOFOL ;  Surgeon: Therisa Bi, MD;  Location: [facility] ENDOSCOPY;  Service: Gastroenterology;  Laterality: N/A;   LEFT HEART CATHETERIZATION WITH CORONARY ANGIOGRAM N/A 02/11/2012   Procedure: LEFT HEART CATHETERIZATION WITH CORONARY ANGIOGRAM;  Surgeon: Ozell Fell, MD;  Location: [facility] CATH LAB;  Service: Cardiovascular;  Laterality: N/A;   POLYPECTOMY  10/30/2022   Procedure: POLYPECTOMY;  Surgeon: Therisa Bi, MD;  Location: [facility] ENDOSCOPY;  Service: Gastroenterology;;   RENAL INTERVENTION N/A 11/17/2018   Procedure: RENAL INTERVENTION;  Surgeon: Jama Cordella MATSU, MD;  Location: [facility] INVASIVE CV LAB;  Service: Cardiovascular;  Laterality: N/A;    Current Medications: Active Medications[1]  Allergies:   Patient has no known allergies.   Social History   Socioeconomic History   Marital status: Legally Separated    Spouse name: Not on file   Number of children: Not on file   Years of education: Not on file   Highest education level: Not on file  Occupational History   Not on file  Tobacco Use   Smoking status: Every Day    Current packs/day: 0.25    Average packs/day: 0.3 packs/day for 30.0 years (7.5 ttl pk-yrs)    Types: Cigarettes   Smokeless tobacco: Never  Vaping Use   Vaping status: Never Used  Substance and Sexual Activity   Alcohol use: Not Currently    Comment: Says she previously drank socially - never a heavy drinker.  No etoh since 08/2018.   Drug use: No    Types: Marijuana    Sexual activity: Not on file  Other Topics Concern   Not on file  Social History Narrative   Lives in [place] w/ her dtr.  Does not routinely exercise.  Disabled/doesn't work.   Social Drivers of Health   Tobacco Use: High Risk (05/03/2024)   Patient History    Smoking Tobacco Use: Every Day    Smokeless Tobacco Use: Never    Passive Exposure: Not on file  Financial Resource Strain: Not on file  Food Insecurity: No Food Insecurity (09/30/2023)   Epic    Worried About Programme Researcher, Broadcasting/film/video in the Last Year: Never true    Ran Out of Food in the Last Year: Never true  Transportation Needs: No Transportation Needs (09/30/2023)   Epic    Lack of Transportation (Medical): No    Lack of Transportation (Non-Medical): No  Physical  Activity: Not on file  Stress: Not on file  Social Connections: Not on file  Depression (PHQ2-9): High Risk (02/17/2024)   Depression (PHQ2-9)    PHQ-2 Score: 20  Alcohol Screen: Not on file  Housing: Low Risk (09/30/2023)   Epic    Unable to Pay for Housing in the Last Year: No    Number of Times Moved in the Last Year: 0    Homeless in the Last Year: No  Utilities: Not At Risk (09/30/2023)   Epic    Threatened with loss of utilities: No  Health Literacy: Not on file     Family History:  The patient's family history includes AAA (abdominal aortic aneurysm) in her father; Cancer in her paternal grandfather; Diabetes in her mother; Hyperlipidemia in her mother; Hypertension in her brother, brother, daughter, father, mother, sister, and sister; Kidney disease in her mother; Supraventricular tachycardia in her sister; Thyroid  disease in her father.  ROS:   12-point review of systems is negative unless otherwise noted in the HPI.   EKGs/Labs/Other Studies Reviewed:    Studies reviewed were summarized above. The additional studies were reviewed today:  Renal artery ultrasound 12/27/2022: IMPRESSION: 1. Arterial velocities near the upper limit of normal are  noted at the origin of the right renal artery, which could suggest stenosis approaching 50-60%. 2. The patient has a known left renal artery stent. There are slightly elevated velocities noted in the mid left renal artery, which could suggest an element of early in-stent stenosis. This is somewhat further supported by borderline low resistive indices measured in the arcuate arteries. These findings could be further evaluated with CT angiography if clinically appropriate. __________   2D echo 12/27/2022: 1. Left ventricular ejection fraction, by estimation, is 35 to 40%. Left  ventricular ejection fraction by 2D MOD biplane is 41.7 %. Left  ventricular ejection fraction by PLAX is 39 %. The left ventricle has  moderately decreased function. The left  ventricle demonstrates global hypokinesis. There is mild left ventricular  hypertrophy. Left ventricular diastolic parameters are consistent with  Grade II diastolic dysfunction (pseudonormalization). There is severe  hypokinesis of the left ventricular,  entire anterior wall and anteroseptal wall.   2. Right ventricular systolic function is low normal. The right  ventricular size is normal.   3. Left atrial size was moderately dilated.   4. Right atrial size was moderately dilated.   5. The mitral valve is normal in structure. Moderate to severe mitral  valve regurgitation.   6. Tricuspid valve regurgitation is moderate.   7. The aortic valve is grossly normal. Aortic valve regurgitation is not  visualized.  __________   Renal artery ultrasound 07/02/2022: Summary:  Largest Aortic Diameter: 2.4 cm    Renal:    Right: Normal size right kidney. No evidence of right renal artery         stenosis. Normal right Resisitive Index. Normal cortical         thickness of right kidney. RRV flow present.  Left:  Normal size of left kidney. No evidence of left renal artery         stenosis. LRV flow present. Normal left Resistive Index.          Normal cortical thickness of the left kidney.  Mesenteric:  Normal Celiac artery and Superior Mesenteric artery findings.  __________   2D echo 02/19/2019:  1. Left ventricular ejection fraction, by visual estimation, is 35 to  40%. The left ventricle has moderately decreased function.  There is no  left ventricular hypertrophy. Anterior and Septal wall hypokinesis.   2. Left ventricular diastolic parameters are consistent with Grade II  diastolic dysfunction (pseudonormalization).   3. Mildly dilated left ventricular internal cavity size.   4. Global right ventricle has normal systolic function.The right  ventricular size is mildly enlarged. No increase in right ventricular wall  thickness.   5. Left atrial size was mild-moderately dilated.   6. Right atrial size was mildly dilated.   7. Moderately elevated pulmonary artery systolic pressure.   8. The tricuspid regurgitant velocity is 2.80 m/s, and with an assumed  right atrial pressure of 20 mmHg, the estimated right ventricular systolic  pressure is moderately elevated at 51.2 mmHg.   9. Small pericardial effusion. __________   2D echo 02/21/2017: - Left ventricle: The cavity size was normal. Systolic function was    moderately to severely reduced. The estimated ejection fraction    was in the range of 30% to 35%. Diffuse hypokinesis with severe    hypokinesis /possible akinesis of the anterior and lateral walls.    Doppler parameters are consistent with abnormal left ventricular    relaxation (grade 1 diastolic dysfunction).  - Mitral valve: There was mild to moderate regurgitation.  - Left atrium: The atrium was normal in size.  - Right ventricle: Systolic function was normal.  - Tricuspid valve: There was moderate regurgitation.  - Pulmonary arteries: Systolic pressure was mildly elevated. __________   Lexiscan  MPI 02/14/2017: There was no ST segment deviation noted during stress. No T wave inversion was noted during  stress. Defect 1: There is a medium defect of severe severity present in the basal anterior, basal anterolateral, mid anterior and mid anterolateral location. This is an intermediate risk study. The left ventricular ejection fraction is moderately decreased (30-44%). Findings consistent with prior myocardial infarction with mild peri-infarct ischemia. __________   2D echo 10/06/2015: - Left ventricle: The cavity size was normal. Systolic function was    moderately reduced. The estimated ejection fraction was in the    range of 35% to 40%. Hypokinesis of the anterior myocardium.    Hypokinesis of the anteroseptal myocardium. Doppler parameters    are consistent with abnormal left ventricular relaxation (grade 1    diastolic dysfunction).  - Mitral valve: Calcified annulus. There was mild regurgitation.  - Left atrium: The atrium was normal in size.  - Right ventricle: Systolic function was normal.  - Pulmonary arteries: Systolic pressure was within the normal    range. ___________   LHC 08/03/2015: Ost LM to LM lesion, 10% stenosed. The lesion was previously treated with a stent (unknown type). Mid LAD lesion, 60% stenosed. Mid RCA lesion, 30% stenosed. There is moderate left ventricular systolic dysfunction.   1. Widely patent ostial left main stent with moderate mid LAD stenosis and occluded second diagonal with faint collaterals. The right coronary artery is very large with mild mid stenosis. 2. Moderately reduced LV systolic function with an ejection fraction of 35-40% with significant anterior wall hypokinesis. Mildly elevated left ventricular end-diastolic pressure. 3. Significant radial artery spasm which required increased amounts of sedation.   Recommendations: Continue medical therapy for coronary artery disease and chronic systolic heart failure. We will need to transition her from clonidine  to an ACE inhibitor or ARB with close monitoring of renal function. __________    Lexiscan  MPI 07/28/2015: Pharmacological myocardial perfusion imaging study with attenuation corrected images suggesting mild anterior wall ischemia, mid to apical region Nonattenuation corrected images  also with mild mid to apical anterior wall ischemia Patient attempted exercise on bruce protocol though unable to reach target heart rate, changed to lexiscan  Anteroseptal wall hypokinesis noted, EF estimated at 33% No EKG changes concerning for ischemia at peak stress or in recovery. Moderate risk scan with mild ischemia as above __________   2D echo 02/12/2012: - Left ventricle: The cavity size was normal. Wall thickness    was increased in a pattern of mild LVH. Systolic function    was mildly to moderately reduced. The estimated ejection    fraction was in the range of 40% to 45%. Hypokinesis of    the entireanterior and lateral myocardium. Doppler    parameters are consistent with abnormal left ventricular    relaxation (grade 1 diastolic dysfunction).  - Mitral valve: Mild regurgitation.  - Pulmonary arteries: PA peak pressure: 43mm Hg (S).   EKG:  EKG is ordered today.  The EKG ordered today demonstrates NSR, 60 bpm, LAFB, incomplete RBBB, LVH  Recent Labs: 10/02/2023: Magnesium  2.7 12/28/2023: Hemoglobin 15.6; Platelets 173 02/17/2024: ALT 19; TSH 2.92 03/01/2024: BNP 664.8 03/24/2024: BUN 21; Creatinine, Ser 1.38; Potassium 3.3; Sodium 142  Recent Lipid Panel    Component Value Date/Time   CHOL 136 03/20/2023 1128   CHOL 136 02/27/2018 1207   TRIG 155 (H) 03/20/2023 1128   HDL 48 (L) 03/20/2023 1128   HDL 53 02/27/2018 1207   CHOLHDL 2.8 03/20/2023 1128   VLDL 15 11/13/2018 0628   LDLCALC 65 03/20/2023 1128    PHYSICAL EXAM:    VS:  BP 124/80 (BP Location: Left Arm, Patient Position: Sitting, Cuff Size: Normal)   Pulse 60 Comment: 64 oximeter  Ht 5' 7 (1.702 m)   Wt 185 lb 9.6 oz (84.2 kg)   SpO2 97%   BMI 29.07 kg/m   BMI: Body mass index is 29.07  kg/m.  Physical Exam Vitals reviewed.  Constitutional:      Appearance: She is well-developed.  HENT:     Head: Normocephalic and atraumatic.  Eyes:     General:        Right eye: No discharge.        Left eye: No discharge.  Cardiovascular:     Rate and Rhythm: Normal rate and regular rhythm.     Heart sounds: S1 normal and S2 normal. Heart sounds not distant. No midsystolic click and no opening snap. Murmur heard.     Systolic murmur is present with a grade of 2/6 at the upper left sternal border.     No friction rub.  Pulmonary:     Effort: Pulmonary effort is normal. No respiratory distress.     Breath sounds: Normal breath sounds. No decreased breath sounds, wheezing, rhonchi or rales.  Musculoskeletal:     Cervical back: Normal range of motion.     Right lower leg: No edema.     Left lower leg: No edema.  Skin:    General: Skin is warm and dry.     Nails: There is no clubbing.  Neurological:     Mental Status: She is alert and oriented to person, place, and time.  Psychiatric:        Speech: Speech normal.        Behavior: Behavior normal.        Thought Content: Thought content normal.        Judgment: Judgment normal.     Wt Readings from Last 3 Encounters:  05/03/24 185 lb 9.6  oz (84.2 kg)  03/01/24 183 lb 12.8 oz (83.4 kg)  02/17/24 182 lb 11.2 oz (82.9 kg)     ASSESSMENT & PLAN:   CAD involving the native coronary arteries without angina: She is doing well and without symptoms concerning for angina or cardiac decompensation.  Continue aggressive risk factor modification and secondary prevention including aspirin  81 mg and clopidogrel  75 mg indefinitely, as long as tolerated, given prior left main stenting.  She otherwise remains on carvedilol  12.5 mg twice daily and atorvastatin  80 mg.  HFrEF secondary to ICM: Euvolemic and well compensated.  She is largely sedentary at baseline with NYHA class around III, though this is difficult to assess.  Continue  current GDMT including carvedilol  12.5 mg twice daily, Farxiga  10 mg, Entresto  97/103 mg twice daily, and torsemide  20 mg daily.  Not on MRA given baseline renal dysfunction.  Update echo on maximally tolerated GDMT to evaluate for improvement in LV systolic function.  CHF education.  Mitral and tricuspid regurgitation: Update echo now the volume status is improved and she is on maximally tolerated GDMT.  CKD stage IIIb: Stable on most recent check.  Check BMP.  HTN with RAS: Blood pressure is well-controlled in the office today.  Continue pharmacotherapy as outlined above.  HLD: LDL 65 in 03/2023.  She remains on atorvastatin  80 mg.  Check LFT and lipid panel.  Alcohol and tobacco use: Complete cessation is recommended.  Not yet ready to quit.     Disposition: F/u with Dr. Darron or an APP in 2 months.   Medication Adjustments/Labs and Tests Ordered: Current medicines are reviewed at length with the patient today.  Concerns regarding medicines are outlined above. Medication changes, Labs and Tests ordered today are summarized above and listed in the Patient Instructions accessible in Encounters.   Bonney Bernardino Bring, PA-C 05/03/2024 12:19 PM     [facility] [place] [address] [place], [place] [address] [phone]     [1]  Current Meds  Medication Sig   acetaminophen  (TYLENOL ) 500 MG tablet Take 1,000 mg by mouth every 6 (six) hours as needed.   allopurinol  (ZYLOPRIM ) 100 MG tablet Take 1 tablet (100 mg total) by mouth daily.   aspirin  81 MG tablet Take 1 tablet (81 mg total) by mouth daily.   atorvastatin  (LIPITOR ) 80 MG tablet Take 1 tablet (80 mg total) by mouth at bedtime. (Hold when needing to take colchicine  for acute gout flares)   busPIRone  (BUSPAR ) 5 MG tablet TAKE 1 TO 3 TABLETS BY MOUTH TWICE DAILY AS NEEDED   carvedilol  (COREG ) 12.5 MG tablet TAKE ONE (1) TABLET (12.5 MG TOTAL) BY MOUTH TWO (2) (TWO) TIMES DAILY WITH A MEAL.    cetirizine  (ZYRTEC ) 10 MG tablet TAKE (1) TABLET BY MOUTH  AT BEDTIME   clonazePAM  (KLONOPIN ) 0.5 MG tablet TAKE ONE (1) TABLET (0.5 MG TOTAL) BY MOUTH TWICE DAILY AS NEEDED FOR ANXIETY (USE SPARINGLY).   clopidogrel  (PLAVIX ) 75 MG tablet TAKE (1) TABLET BY MOUTH EVERY DAY   colchicine  0.6 MG tablet Take 2 tabs for initial dose, then take 1 tab daily until pain subsides   dapagliflozin  propanediol (FARXIGA ) 10 MG TABS tablet Take 1 tablet (10 mg total) by mouth daily before breakfast.   famotidine  (PEPCID ) 20 MG tablet Take 1 tablet (20 mg total) by mouth daily.   gabapentin  (NEURONTIN ) 300 MG capsule Take 1 capsule (300 mg total) by mouth 2 (two) times daily.   hydrOXYzine  (ATARAX )  25 MG tablet Take 1 tablet (25 mg total) by mouth every 8 (eight) hours as needed for anxiety.   levothyroxine  (SYNTHROID ) 125 MCG tablet Take 1 tablet (125 mcg total) by mouth daily before breakfast.   nitroGLYCERIN  (NITROSTAT ) 0.4 MG SL tablet Place 1 tablet (0.4 mg total) under the tongue every 5 (five) minutes x 3 doses as needed for chest pain.   PARoxetine  (PAXIL ) 40 MG tablet TAKE (1) TABLET BY MOUTH EVERY DAY   potassium chloride  (KLOR-CON  M) 10 MEQ tablet Take 1 tablet (10 mEq total) by mouth daily.   sacubitril -valsartan  (ENTRESTO ) 97-103 MG TAKE ONE (1) TABLET BY MOUTH TWO (2) (TWO) TIMES DAILY.   torsemide  (DEMADEX ) 20 MG tablet TAKE (1) TABLET BY MOUTH EVERY DAY   [DISCONTINUED] pantoprazole  (PROTONIX ) 40 MG tablet Take 1 tablet (40 mg total) by mouth daily.   Current Facility-Administered Medications for the 05/03/24 encounter (Office Visit) with Abigail Bernardino HERO, PA-C  Medication   mometasone -formoterol  (DULERA ) 200-5 MCG/ACT inhaler 2 puff   "

## 2024-05-03 ENCOUNTER — Ambulatory Visit: Attending: Physician Assistant | Admitting: Physician Assistant

## 2024-05-03 ENCOUNTER — Encounter: Payer: Self-pay | Admitting: Physician Assistant

## 2024-05-03 VITALS — BP 124/80 | HR 60 | Ht 67.0 in | Wt 185.6 lb

## 2024-05-03 DIAGNOSIS — I255 Ischemic cardiomyopathy: Secondary | ICD-10-CM | POA: Diagnosis not present

## 2024-05-03 DIAGNOSIS — I071 Rheumatic tricuspid insufficiency: Secondary | ICD-10-CM | POA: Diagnosis not present

## 2024-05-03 DIAGNOSIS — I5022 Chronic systolic (congestive) heart failure: Secondary | ICD-10-CM | POA: Insufficient documentation

## 2024-05-03 DIAGNOSIS — N1832 Chronic kidney disease, stage 3b: Secondary | ICD-10-CM | POA: Diagnosis not present

## 2024-05-03 DIAGNOSIS — F1090 Alcohol use, unspecified, uncomplicated: Secondary | ICD-10-CM | POA: Insufficient documentation

## 2024-05-03 DIAGNOSIS — I701 Atherosclerosis of renal artery: Secondary | ICD-10-CM | POA: Insufficient documentation

## 2024-05-03 DIAGNOSIS — I340 Nonrheumatic mitral (valve) insufficiency: Secondary | ICD-10-CM | POA: Insufficient documentation

## 2024-05-03 DIAGNOSIS — Z79899 Other long term (current) drug therapy: Secondary | ICD-10-CM | POA: Insufficient documentation

## 2024-05-03 DIAGNOSIS — E785 Hyperlipidemia, unspecified: Secondary | ICD-10-CM | POA: Diagnosis not present

## 2024-05-03 DIAGNOSIS — I2510 Atherosclerotic heart disease of native coronary artery without angina pectoris: Secondary | ICD-10-CM | POA: Insufficient documentation

## 2024-05-03 DIAGNOSIS — I10 Essential (primary) hypertension: Secondary | ICD-10-CM | POA: Insufficient documentation

## 2024-05-03 DIAGNOSIS — Z720 Tobacco use: Secondary | ICD-10-CM | POA: Diagnosis not present

## 2024-05-03 MED ORDER — PANTOPRAZOLE SODIUM 40 MG PO TBEC: 40.0000 mg | DELAYED_RELEASE_TABLET | Freq: Every day | ORAL | 3 refills | Status: AC

## 2024-05-03 NOTE — Patient Instructions (Signed)
 Medication Instructions:  Your physician recommends that you continue on your current medications as directed. Please refer to the Current Medication list given to you today.   *If you need a refill on your cardiac medications before your next appointment, please call your pharmacy*  Lab Work: Your provider would like for you to have following labs drawn today CMeT and Lipids.   If you have labs (blood work) drawn today and your tests are completely normal, you will receive your results only by: MyChart Message (if you have MyChart) OR A paper copy in the mail If you have any lab test that is abnormal or we need to change your treatment, we will call you to review the results.  Testing/Procedures: Your physician has requested that you have an echocardiogram. Echocardiography is a painless test that uses sound waves to create images of your heart. It provides your doctor with information about the size and shape of your heart and how well your hearts chambers and valves are working.   You may receive an ultrasound enhancing agent through an IV if needed to better visualize your heart during the echo. This procedure takes approximately one hour.  There are no restrictions for this procedure.  This will take place at [address] [facility] [address], [place] [address]  Please note: We ask at that you not bring children with you during ultrasound (echo/ vascular) testing. Due to room size and safety concerns, children are not allowed in the ultrasound rooms during exams. Our front office staff cannot provide observation of children in our lobby area while testing is being conducted. An adult accompanying a patient to their appointment will only be allowed in the ultrasound room at the discretion of the ultrasound technician under special circumstances. We apologize for any inconvenience.   Follow-Up: At [facility], you and your health needs are our priority.  As part of  our continuing mission to provide you with exceptional heart care, our providers are all part of one team.  This team includes your primary Cardiologist (physician) and Advanced Practice Providers or APPs (Physician Assistants and Nurse Practitioners) who all work together to provide you with the care you need, when you need it.  Your next appointment:   2 month(s)  Provider:   You may see Deatrice Cage, MD or Bernardino Bring, PA-C  We recommend signing up for the patient portal called MyChart.  Sign up information is provided on this After Visit Summary.  MyChart is used to connect with patients for Virtual Visits (Telemedicine).  Patients are able to view lab/test results, encounter notes, upcoming appointments, etc.  Non-urgent messages can be sent to your provider as well.   To learn more about what you can do with MyChart, go to [email or web address].

## 2024-05-04 ENCOUNTER — Ambulatory Visit: Payer: Self-pay | Admitting: Physician Assistant

## 2024-05-04 ENCOUNTER — Other Ambulatory Visit: Payer: Self-pay | Admitting: Emergency Medicine

## 2024-05-04 ENCOUNTER — Other Ambulatory Visit: Payer: Self-pay | Admitting: Nurse Practitioner

## 2024-05-04 DIAGNOSIS — Z79899 Other long term (current) drug therapy: Secondary | ICD-10-CM

## 2024-05-04 DIAGNOSIS — F411 Generalized anxiety disorder: Secondary | ICD-10-CM

## 2024-05-04 DIAGNOSIS — E785 Hyperlipidemia, unspecified: Secondary | ICD-10-CM

## 2024-05-04 LAB — COMPREHENSIVE METABOLIC PANEL WITH GFR
ALT: 19 IU/L (ref 0–32)
AST: 21 IU/L (ref 0–40)
Albumin: 4.6 g/dL (ref 3.8–4.9)
Alkaline Phosphatase: 138 IU/L — ABNORMAL HIGH (ref 49–135)
BUN/Creatinine Ratio: 17 (ref 9–23)
BUN: 23 mg/dL (ref 6–24)
Bilirubin Total: 0.6 mg/dL (ref 0.0–1.2)
CO2: 19 mmol/L — ABNORMAL LOW (ref 20–29)
Calcium: 10.3 mg/dL — ABNORMAL HIGH (ref 8.7–10.2)
Chloride: 100 mmol/L (ref 96–106)
Creatinine, Ser: 1.37 mg/dL — ABNORMAL HIGH (ref 0.57–1.00)
Globulin, Total: 2.3 g/dL (ref 1.5–4.5)
Glucose: 94 mg/dL (ref 70–99)
Potassium: 3.7 mmol/L (ref 3.5–5.2)
Sodium: 140 mmol/L (ref 134–144)
Total Protein: 6.9 g/dL (ref 6.0–8.5)
eGFR: 46 mL/min/1.73 — ABNORMAL LOW

## 2024-05-04 LAB — LIPID PANEL
Chol/HDL Ratio: 2.6 ratio (ref 0.0–4.4)
Cholesterol, Total: 170 mg/dL (ref 100–199)
HDL: 65 mg/dL
LDL Chol Calc (NIH): 72 mg/dL (ref 0–99)
Triglycerides: 200 mg/dL — ABNORMAL HIGH (ref 0–149)
VLDL Cholesterol Cal: 33 mg/dL (ref 5–40)

## 2024-05-04 MED ORDER — EZETIMIBE 10 MG PO TABS: 10.0000 mg | ORAL_TABLET | Freq: Every day | ORAL | 3 refills | Status: AC

## 2024-05-04 MED ORDER — POTASSIUM CHLORIDE CRYS ER 10 MEQ PO TBCR: 10.0000 meq | EXTENDED_RELEASE_TABLET | Freq: Every day | ORAL | 3 refills | Status: AC

## 2024-05-04 NOTE — Telephone Encounter (Signed)
 Requested medications are due for refill today.  yes  Requested medications are on the active medications list.  yes  Last refill. 03/26/2024 #30 0 rf  Future visit scheduled.   yes  Notes to clinic.  Refill not delegated.    Requested Prescriptions  Pending Prescriptions Disp Refills   clonazePAM  (KLONOPIN ) 0.5 MG tablet [Pharmacy Med Name: CLONAZEPAM  0.5MG  TABLET] 30 tablet 0    Sig: TAKE ONE (1) TABLET (0.5 MG TOTAL) BY MOUTH TWICE DAILY AS NEEDED FOR ANXIETY (USE SPARINGLY).     Not Delegated - Psychiatry: Anxiolytics/Hypnotics 2 Failed - 05/04/2024  4:25 PM      Failed - This refill cannot be delegated      Failed - Urine Drug Screen completed in last 360 days      Passed - Patient is not pregnant      Passed - Valid encounter within last 6 months    Recent Outpatient Visits           2 months ago GAD (generalized anxiety disorder)   [facility] Gareth Mliss FALCON, FNP   3 months ago GAD (generalized anxiety disorder)   [facility] Leavy Mole, PA-C   10 months ago Other secondary acute gout of right wrist   [facility] Gareth Mliss FALCON, FNP       Future Appointments             In 2 months Dunn, Bernardino HERO, PA-C [facility]

## 2024-06-03 ENCOUNTER — Ambulatory Visit

## 2024-07-06 ENCOUNTER — Ambulatory Visit: Admitting: Physician Assistant

## 2024-08-17 ENCOUNTER — Ambulatory Visit: Admitting: Nurse Practitioner
